# Patient Record
Sex: Female | Born: 1950 | Race: White | Hispanic: No | Marital: Married
Health system: Southern US, Community
[De-identification: ages and names within clinical notes are randomized; demographics above are authoritative.]

## PROBLEM LIST (undated history)

## (undated) DIAGNOSIS — I2510 Atherosclerotic heart disease of native coronary artery without angina pectoris: Secondary | ICD-10-CM

## (undated) DIAGNOSIS — I10 Essential (primary) hypertension: Secondary | ICD-10-CM

## (undated) DIAGNOSIS — I509 Heart failure, unspecified: Secondary | ICD-10-CM

## (undated) DIAGNOSIS — R7303 Prediabetes: Secondary | ICD-10-CM

## (undated) DIAGNOSIS — I712 Thoracic aortic aneurysm, without rupture, unspecified: Secondary | ICD-10-CM

## (undated) DIAGNOSIS — F419 Anxiety disorder, unspecified: Secondary | ICD-10-CM

## (undated) DIAGNOSIS — I779 Disorder of arteries and arterioles, unspecified: Secondary | ICD-10-CM

## (undated) DIAGNOSIS — Z972 Presence of dental prosthetic device (complete) (partial): Secondary | ICD-10-CM

## (undated) DIAGNOSIS — D649 Anemia, unspecified: Secondary | ICD-10-CM

## (undated) DIAGNOSIS — Z9289 Personal history of other medical treatment: Secondary | ICD-10-CM

## (undated) DIAGNOSIS — I493 Ventricular premature depolarization: Secondary | ICD-10-CM

## (undated) DIAGNOSIS — Z973 Presence of spectacles and contact lenses: Secondary | ICD-10-CM

## (undated) DIAGNOSIS — K279 Peptic ulcer, site unspecified, unspecified as acute or chronic, without hemorrhage or perforation: Secondary | ICD-10-CM

## (undated) DIAGNOSIS — S060XAA Concussion with loss of consciousness status unknown, initial encounter: Secondary | ICD-10-CM

## (undated) DIAGNOSIS — M1990 Unspecified osteoarthritis, unspecified site: Secondary | ICD-10-CM

## (undated) DIAGNOSIS — I714 Abdominal aortic aneurysm, without rupture, unspecified: Secondary | ICD-10-CM

## (undated) DIAGNOSIS — K219 Gastro-esophageal reflux disease without esophagitis: Secondary | ICD-10-CM

## (undated) DIAGNOSIS — I739 Peripheral vascular disease, unspecified: Secondary | ICD-10-CM

## (undated) DIAGNOSIS — I700 Atherosclerosis of aorta: Secondary | ICD-10-CM

## (undated) DIAGNOSIS — Z720 Tobacco use: Secondary | ICD-10-CM

## (undated) DIAGNOSIS — J449 Chronic obstructive pulmonary disease, unspecified: Secondary | ICD-10-CM

## (undated) DIAGNOSIS — C801 Malignant (primary) neoplasm, unspecified: Secondary | ICD-10-CM

## (undated) DIAGNOSIS — E785 Hyperlipidemia, unspecified: Secondary | ICD-10-CM

## (undated) DIAGNOSIS — R079 Chest pain, unspecified: Secondary | ICD-10-CM

## (undated) DIAGNOSIS — Q249 Congenital malformation of heart, unspecified: Secondary | ICD-10-CM

## (undated) DIAGNOSIS — G629 Polyneuropathy, unspecified: Secondary | ICD-10-CM

## (undated) DIAGNOSIS — M810 Age-related osteoporosis without current pathological fracture: Secondary | ICD-10-CM

## (undated) DIAGNOSIS — J189 Pneumonia, unspecified organism: Secondary | ICD-10-CM

## (undated) DIAGNOSIS — J45909 Unspecified asthma, uncomplicated: Secondary | ICD-10-CM

## (undated) DIAGNOSIS — R002 Palpitations: Secondary | ICD-10-CM

## (undated) HISTORY — DX: Palpitations: R00.2

## (undated) HISTORY — DX: Ventricular premature depolarization: I49.3

## (undated) HISTORY — PX: MULTIPLE TOOTH EXTRACTIONS: SHX2053

## (undated) HISTORY — PX: ROTATOR CUFF REPAIR: SHX139

## (undated) HISTORY — PX: WRIST ARTHROCENTESIS: SUR48

## (undated) HISTORY — DX: Hyperlipidemia, unspecified: E78.5

## (undated) HISTORY — PX: BREAST SURGERY: SHX581

## (undated) HISTORY — DX: Disorder of arteries and arterioles, unspecified: I77.9

## (undated) HISTORY — PX: VEIN BYPASS SURGERY: SHX833

## (undated) HISTORY — DX: Peripheral vascular disease, unspecified: I73.9

## (undated) HISTORY — DX: Unspecified asthma, uncomplicated: J45.909

## (undated) HISTORY — DX: Congenital malformation of heart, unspecified: Q24.9

## (undated) HISTORY — DX: Personal history of other medical treatment: Z92.89

## (undated) HISTORY — DX: Thoracic aortic aneurysm, without rupture, unspecified: I71.20

## (undated) HISTORY — DX: Polyneuropathy, unspecified: G62.9

## (undated) HISTORY — DX: Essential (primary) hypertension: I10

## (undated) HISTORY — PX: CARDIAC CATHETERIZATION: SHX172

## (undated) HISTORY — DX: Chronic obstructive pulmonary disease, unspecified: J44.9

## (undated) HISTORY — DX: Gastro-esophageal reflux disease without esophagitis: K21.9

## (undated) HISTORY — DX: Age-related osteoporosis without current pathological fracture: M81.0

## (undated) HISTORY — PX: TUBAL LIGATION: SHX77

## (undated) HISTORY — PX: ABDOMINAL HYSTERECTOMY: SHX81

## (undated) HISTORY — DX: Unspecified osteoarthritis, unspecified site: M19.90

## (undated) HISTORY — PX: HIP SURGERY: SHX245

---

## 1998-03-30 ENCOUNTER — Other Ambulatory Visit: Admission: RE | Admit: 1998-03-30 | Discharge: 1998-03-30 | Payer: Self-pay | Admitting: Family Medicine

## 2001-01-13 ENCOUNTER — Encounter: Admission: RE | Admit: 2001-01-13 | Discharge: 2001-01-13 | Payer: Self-pay

## 2001-01-23 ENCOUNTER — Encounter: Payer: Self-pay | Admitting: *Deleted

## 2001-01-23 ENCOUNTER — Inpatient Hospital Stay (HOSPITAL_COMMUNITY): Admission: EM | Admit: 2001-01-23 | Discharge: 2001-01-24 | Payer: Self-pay | Admitting: Emergency Medicine

## 2001-01-29 ENCOUNTER — Encounter: Admission: RE | Admit: 2001-01-29 | Discharge: 2001-01-29 | Payer: Self-pay

## 2003-09-29 ENCOUNTER — Ambulatory Visit (HOSPITAL_COMMUNITY): Admission: RE | Admit: 2003-09-29 | Discharge: 2003-09-29 | Payer: Self-pay | Admitting: Vascular Surgery

## 2003-10-11 ENCOUNTER — Inpatient Hospital Stay (HOSPITAL_COMMUNITY): Admission: RE | Admit: 2003-10-11 | Discharge: 2003-10-14 | Payer: Self-pay | Admitting: Vascular Surgery

## 2004-05-30 ENCOUNTER — Ambulatory Visit (HOSPITAL_COMMUNITY): Admission: RE | Admit: 2004-05-30 | Discharge: 2004-05-30 | Payer: Self-pay | Admitting: Gastroenterology

## 2013-06-25 ENCOUNTER — Encounter (INDEPENDENT_AMBULATORY_CARE_PROVIDER_SITE_OTHER): Payer: Self-pay | Admitting: General Surgery

## 2013-06-25 ENCOUNTER — Encounter (INDEPENDENT_AMBULATORY_CARE_PROVIDER_SITE_OTHER): Payer: Self-pay

## 2013-06-25 ENCOUNTER — Ambulatory Visit (INDEPENDENT_AMBULATORY_CARE_PROVIDER_SITE_OTHER): Payer: Medicare Other | Admitting: General Surgery

## 2013-06-25 VITALS — BP 140/80 | HR 64 | Temp 98.4°F | Resp 14 | Ht 68.0 in | Wt 149.6 lb

## 2013-06-25 DIAGNOSIS — M6208 Separation of muscle (nontraumatic), other site: Secondary | ICD-10-CM

## 2013-06-25 DIAGNOSIS — M6200 Separation of muscle (nontraumatic), unspecified site: Secondary | ICD-10-CM

## 2013-06-25 NOTE — Patient Instructions (Signed)
The bulge in your upper abdomen that you see when you sit up is not a hernia. 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  The CT scan also does not show any hernia or defect in the muscles. You do not need an operation  This is not the cause of your pain.  This is not dangerous and it is unlikely to cause any problem with the intestines.  Keep her appointment for cardiac evaluation because of your coronary artery calcifications.  Ask Dr. Nathanial Rancher to refer you to a gastroenterologist because of the thickening of your gastric wall seen on CT scan  Stop smoking.

## 2013-06-25 NOTE — Progress Notes (Signed)
Patient ID: Marie Johnson, female   DOB: [birth year], 62 y.o.   MRN: [id number]  This 62 year old Caucasian female is referred by Dr. Burnell Blanks, [facility], [facility], for evaluation of a bulge in the upper abdomen.  The patient has noticed a bulge in her upper abdomen when she sits up. She has some abdominal discomfort and bloating and nausea. Liver function tests were normal. Gallbladder ultrasound was basically normal except for benign hepatic cysts. A CT scan showed a tiny lung nodule. Significant coronary calcifications were noted and she has been referred to [facility] cardiology for evaluation. She did have a cardiac cath by [employer] many years ago. Renal artery vascular calcifications were noted. 3.4 cm abdominal aortic aneurysm was noted but she was aware of this. There was no abdominal wall hernia seen on CT scan. Gastric wall thickening was noted.  Comorbidities include tobacco abuse, one and one half packs per day. Status post left femoropopliteal bypass by Dr. Arbie Cookey 15 years ago. Asthma. Anxiety. Abdominal aortic aneurysm. Coronary artery disease, hypertension. She is here with her husband. Chief Complaint  Patient presents with  . New Evaluation    eval ventral hernia    HPI Marie Johnson is a 62 y.o. female.  She is recurred by Dr. Burnell Blanks, [facility] she is, [facility] for evaluation of a bulge in the upper abdomen.  HPI  Past Medical History  Diagnosis Date  . Arthritis   . Asthma   . GERD (gastroesophageal reflux disease)   . COPD (chronic obstructive pulmonary disease)   . Hyperlipidemia   . Hypertension   . Osteoporosis   . PAD (peripheral artery disease)   . CHD (congenital heart disease)     Past Surgical History  Procedure Laterality Date  . Vein bypass surgery      left leg  . Rotator cuff repair      right side, had torn ligaments as well  . Wrist arthrocentesis      left - broke in 3 places  so has bars and screws placed  . Hip surgery      left - bars and screws placed  . Abdominal hysterectomy    . Tubal ligation      Family History  Problem Relation Age of Onset  . Cancer Mother     cervica  . Heart disease Mother   . Cancer Father     liver  . Heart disease Father   . Cancer Maternal Aunt     lung  . Cancer Maternal Uncle     prostate and anal  . Cancer Maternal Grandmother     breast  . Cancer Maternal Aunt     lung  . Cancer Maternal Aunt     lung  . Cancer Maternal Aunt     stomach  . Cancer Maternal Aunt     colon  . Cancer Maternal Uncle     lung    Social History History  Substance Use Topics  . Smoking status: Current Every Day Smoker -- 0.50 packs/day  . Smokeless tobacco: Never Used  . Alcohol Use: No    Allergies  Allergen Reactions  . Beesix [Pyridoxine]     Current Outpatient Prescriptions  Medication Sig Dispense Refill  . atenolol (TENORMIN) 50 MG tablet 50 mg 2 (two) times daily.       . furosemide (LASIX) 40 MG tablet       . LORazepam (ATIVAN) 1  MG tablet       . losartan (COZAAR) 25 MG tablet       . NITROSTAT 0.4 MG SL tablet       . Oxycodone HCl 10 MG TABS       . pantoprazole (PROTONIX) 40 MG tablet       . pravastatin (PRAVACHOL) 40 MG tablet       . PROAIR HFA 108 (90 BASE) MCG/ACT inhaler       . traMADol (ULTRAM) 50 MG tablet        No current facility-administered medications for this visit.    Review of Systems Review of Systems  Constitutional: Negative for fever, chills and unexpected weight change.  HENT: Negative for congestion, hearing loss, sore throat, trouble swallowing and voice change.   Eyes: Negative for visual disturbance.  Respiratory: Negative for cough and wheezing.   Cardiovascular: Negative for chest pain, palpitations and leg swelling.  Gastrointestinal: Positive for abdominal pain and abdominal distention. Negative for nausea, vomiting, diarrhea, constipation, blood in stool and anal  bleeding.  Genitourinary: Negative for hematuria, vaginal bleeding and difficulty urinating.  Musculoskeletal: Negative for arthralgias.  Skin: Negative for rash and wound.  Neurological: Negative for seizures, syncope and headaches.  Hematological: Negative for adenopathy. Does not bruise/bleed easily.  Psychiatric/Behavioral: Negative for confusion.    Blood pressure 140/80, pulse 64, temperature 98.4 F (36.9 C), resp. rate 14, height 5\' 8"  (1.727 m), weight 149 lb 9.6 oz (67.858 kg).  Physical Exam Physical Exam  Constitutional: She is oriented to person, place, and time. She appears well-developed and well-nourished. No distress.  Strong odor of tobacco.  HENT:  Head: Normocephalic and atraumatic.  Nose: Nose normal.  Mouth/Throat: No oropharyngeal exudate.  Eyes: Conjunctivae and EOM are normal. Pupils are equal, round, and reactive to light. Left eye exhibits no discharge. No scleral icterus.  Neck: Neck supple. No JVD present. No tracheal deviation present. No thyromegaly present.  Cardiovascular: Normal rate, regular rhythm, normal heart sounds and intact distal pulses.   No murmur heard. Pulmonary/Chest: Effort normal and breath sounds normal. No respiratory distress. She has no wheezes. She has no rales. She exhibits no tenderness.  Abdominal: Soft. Bowel sounds are normal. She exhibits no distension and no mass. There is no tenderness. There is no rebound and no guarding.  She has a elliptical bulge in her upper upper abdomen notable when she sits up from a supine position. There is no muscle defect. There is no defect or bulge when she is standing. She has a diastases recti. No organomegaly or significant tenderness.  Musculoskeletal: She exhibits no edema and no tenderness.  Lymphadenopathy:    She has no cervical adenopathy.  Neurological: She is alert and oriented to person, place, and time. She exhibits normal muscle tone. Coordination normal.  Skin: Skin is warm. No  rash noted. She is not diaphoretic. No erythema. No pallor.  Psychiatric: She has a normal mood and affect. Her behavior is normal. Judgment and thought content normal.    Data Reviewed CT scan report. Office notes from [employer]. Other imaging studies and lab work.  Assessment/Plan:    Diastases recti. There is no indication for surgical intervention. This is not dangerous and I do not think this is the cause of her abdominal pain.  Gastric wall thickening. Advised that she seek referral to a gastroenterologist for possible upper endoscopy  Coronary disease with calcifications. Agree with plans cardiology evaluation  She was strongly urged to  stop smoking  Return to see me as needed.             Angelia Mould. Derrell Lolling, M.D., [facility] General and Minimally invasive Surgery Breast and Colorectal Surgery Office:   [phone] Pager:   [phone]  06/25/2013, 11:58 AM

## 2013-06-26 ENCOUNTER — Telehealth: Payer: Self-pay

## 2013-06-26 NOTE — Telephone Encounter (Signed)
Recd records from [facility], [place] 10 pgs to Historical Provider

## 2013-07-13 ENCOUNTER — Ambulatory Visit (INDEPENDENT_AMBULATORY_CARE_PROVIDER_SITE_OTHER): Payer: Medicare Other | Admitting: Cardiology

## 2013-07-13 ENCOUNTER — Encounter: Payer: Self-pay | Admitting: Cardiology

## 2013-07-13 VITALS — BP 142/74 | HR 56 | Ht 69.0 in | Wt 146.0 lb

## 2013-07-13 DIAGNOSIS — R079 Chest pain, unspecified: Secondary | ICD-10-CM

## 2013-07-13 NOTE — Patient Instructions (Addendum)
**Note De-identified Dorthula Bier Obfuscation** Your physician has requested that you have a lexiscan myoview. For further information please visit [email or web address]. Please follow instruction sheet, as given.  Your physician recommends that you schedule a follow-up appointment in: 1 month.   

## 2013-07-13 NOTE — Progress Notes (Signed)
Patient ID: Marie Johnson, female   DOB: 13-Aug-1951, 62 y.o.   MRN: [id number]    Patient Name: Marie Johnson Date of Encounter: 07/13/2013  Primary Care Provider:  Ailene Ravel, MD Primary Cardiologist:  Tobias Alexander, H  Patient Profile  Chest pain  Problem List   Past Medical History  Diagnosis Date  . Arthritis   . Asthma   . GERD (gastroesophageal reflux disease)   . COPD (chronic obstructive pulmonary disease)   . Hyperlipidemia   . Hypertension   . Osteoporosis   . PAD (peripheral artery disease)   . CHD (congenital heart disease)    Past Surgical History  Procedure Laterality Date  . Vein bypass surgery      left leg  . Rotator cuff repair      right side, had torn ligaments as well  . Wrist arthrocentesis      left - broke in 3 places so has bars and screws placed  . Hip surgery      left - bars and screws placed  . Abdominal hysterectomy    . Tubal ligation     Allergies  Allergies  Allergen Reactions  . Beesix [Pyridoxine]    HPI  62 year old female, older appearing, lifelong smoker with prior medical history of hypertension, PAD who was recently evaluated for an abdominal hernia. There was an incidental finding of significant coronary calcifications on the CT and she was referred to [place] for further evaluation.  The patient states that she has exertional dyspnea and chest tightness with minimal exertion. As an example she uses this walking one flight of stairs when she has to stop and it resolves within a few minutes. She denies any resting chest pain. Denies any palpitations or syncope. The pain feels pressure-like retrosternal with no radiation. She did have a cardiac cath by [employer] many years ago with findings of non-obstructive coronary artery disease at that time.  There was 3.4 cm abdominal aortic aneurysm on her CT that is known for many years. The patient is a lifelong smoker, currently smoking one and one half packs per day. She has a  h/o left femoropopliteal bypass by Dr. Arbie Cookey 15 years ago. She complains of bilateral claudications left worse than right that start after walking minimal distances. She underwent arterial Doppler ultrasound bilaterally and lower extremity angiogram at [facility]. She was talled that she had severe diffuse peripheral arterial disease, however her lesions are distal and her vessels are small for interventions.  Home Medications  Prior to Admission medications   Medication Sig Start Date End Date Taking? Authorizing Provider  atenolol (TENORMIN) 50 MG tablet 50 mg 2 (two) times daily.  06/09/13  Yes Historical Provider, MD  furosemide (LASIX) 40 MG tablet  06/09/13  Yes Historical Provider, MD  LORazepam (ATIVAN) 1 MG tablet  04/22/13  Yes Historical Provider, MD  losartan (COZAAR) 25 MG tablet  05/26/13  Yes Historical Provider, MD  NITROSTAT 0.4 MG SL tablet  06/24/13  Yes Historical Provider, MD  Oxycodone HCl 10 MG TABS  06/08/13  Yes Historical Provider, MD  pantoprazole (PROTONIX) 40 MG tablet  06/09/13  Yes Historical Provider, MD  pravastatin (PRAVACHOL) 40 MG tablet  05/27/13  Yes Historical Provider, MD  PROAIR HFA 108 (90 BASE) MCG/ACT inhaler  06/08/13  Yes Historical Provider, MD  traMADol Janean Sark) 50 MG tablet  06/14/13  Yes Historical Provider, MD   Family History  Family History  Problem Relation Age of Onset  .  Cancer Mother     cervica  . Heart disease Mother   . Cancer Father     liver  . Heart disease Father   . Cancer Maternal Aunt     lung  . Cancer Maternal Uncle     prostate and anal  . Cancer Maternal Grandmother     breast  . Cancer Maternal Aunt     lung  . Cancer Maternal Aunt     lung  . Cancer Maternal Aunt     stomach  . Cancer Maternal Aunt     colon  . Cancer Maternal Uncle     lung    Social History  History   Social History  . Marital Status: Married    Spouse Name: N/A    Number of Children: N/A  . Years of Education: N/A    Occupational History  . Not on file.   Social History Main Topics  . Smoking status: Current Every Day Smoker -- 0.50 packs/day  . Smokeless tobacco: Never Used  . Alcohol Use: No  . Drug Use: No  . Sexual Activity: Not on file   Other Topics Concern  . Not on file   Social History Narrative  . No narrative on file     Review of Systems General:  No chills, fever, night sweats or weight changes.  Cardiovascular:  No chest pain, dyspnea on exertion, edema, orthopnea, palpitations, paroxysmal nocturnal dyspnea. Dermatological: No rash, lesions/masses Respiratory: No cough, dyspnea Urologic: No hematuria, dysuria Abdominal:   No nausea, vomiting, diarrhea, bright red blood per rectum, melena, or hematemesis Neurologic:  No visual changes, wkns, changes in mental status. All other systems reviewed and are otherwise negative except as noted above.  Physical Exam  Blood pressure 142/74, pulse 56, height 5\' 9"  (1.753 m), weight 146 lb (66.225 kg).  General: Older appearang, smelling like cigaretes, NAD Psych: Normal affect. Neuro: Alert and oriented X 3. Moves all extremities spontaneously. HEENT: Normal  Neck: Supple without bruits or JVD. Lungs:  Resp regular and unlabored, CTA. Heart: RRR no s3, s4, or murmurs. Abdomen: Soft, non-tender, non-distended, BS + x 4.  Extremities: No clubbing, cyanosis or edema. Radials 2+ LE cold, no pulses  DP/PT/ bilaterally.  Accessory Clinical Findings  ECG - sinus bradycardia, 56 beats per minute, otherwise normal EKG.  Lipid Panel  No results found for this basename: chol, trig, hdl, cholhdl, vldl, ldlcalc    Assessment & Plan  62 year old female lifelong smoker, older appearing, coming for concern of chest pain and coronary artery calcifications on CT.  1. Chest pain - appears typical, patient has multiple risk factors including smoking, hypertension, hyperlipidemia, we will order a Lexiscan a nuclear stress test as the patient  wouldn't be able to walk on treadmill because she is limited by her claudications. Continue aspirin, statin, atenolol, losartan, s.l. nitroglycerin when necessary.  2. Hyperlipidemia - TAG 98, HDL 78, LDL 97, WNL, continue pravachol 40 mg po daily  3. Hypertension - borderline, we will rechcek at the next visit, if still elevated we will adjust BP meds  4. PAD - severe B/L, however not amenable to intervention, the patient was consulted on smoking cessation, she is not interested  5. Smoking cessation - the patient is not interested in any intervention or support group and doesn't seem to be interested in cessation in general  Follow up in 1 month   Tobias Alexander, Rexene Edison, MD 07/13/2013, 4:05 PM

## 2013-07-16 ENCOUNTER — Ambulatory Visit (HOSPITAL_BASED_OUTPATIENT_CLINIC_OR_DEPARTMENT_OTHER): Payer: Medicare Other | Admitting: Radiology

## 2013-07-16 VITALS — BP 142/77 | HR 72 | Ht 69.0 in | Wt 145.0 lb

## 2013-07-16 DIAGNOSIS — R079 Chest pain, unspecified: Secondary | ICD-10-CM

## 2013-07-16 DIAGNOSIS — R0602 Shortness of breath: Secondary | ICD-10-CM

## 2013-07-16 MED ORDER — TECHNETIUM TC 99M SESTAMIBI GENERIC - CARDIOLITE
11.0000 | Freq: Once | INTRAVENOUS | Status: AC | PRN
  Administered 2013-07-16: 11 via INTRAVENOUS

## 2013-07-16 MED ORDER — REGADENOSON 0.4 MG/5ML IV SOLN
0.4000 mg | Freq: Once | INTRAVENOUS | Status: AC
  Administered 2013-07-16: 0.4 mg via INTRAVENOUS

## 2013-07-16 MED ORDER — TECHNETIUM TC 99M SESTAMIBI GENERIC - CARDIOLITE
33.0000 | Freq: Once | INTRAVENOUS | Status: AC | PRN
  Administered 2013-07-16: 33 via INTRAVENOUS

## 2013-07-16 NOTE — Progress Notes (Signed)
[facility] SITE 3 NUCLEAR MED [address] [place], [place] [address] [phone]    Cardiology Nuclear Med Study  Marie Johnson is a 62 y.o. female     MRN : [id number]     DOB: [birth year]  Procedure Date: 07/16/2013  Nuclear Med Background Indication for Stress Test:  Evaluation for Ischemia History:  CAD, 3.4 cm AAA, MR - coronary calcifications Cardiac Risk Factors: Family History - CAD, History of Smoking, Hypertension, Lipids and PVD  Symptoms:  Chest Pain with Exertion (last date of chest discomfort was yesterday), Dizziness, DOE and Syncope   Nuclear Pre-Procedure Caffeine/Decaff Intake:  None NPO After: 5 pm   Lungs:  Patient has some wheezing which is common for her. O2 Sat: 98% on room air. IV 0.9% NS with Angio Cath:  22g  IV Site: R Antecubital  IV Started by:  Bonnita Levan, RN  Chest Size (in):  36 Cup Size: B  Height: 5\' 9"  (1.753 m)  Weight:  145 lb (65.772 kg)  BMI:  Body mass index is 21.4 kg/(m^2). Tech Comments:  N/A    Nuclear Med Study 1 or 2 day study: 1 day  Stress Test Type:  Lexiscan  Reading MD: Marca Ancona, MD  Order Authorizing Provider:  Tobias Alexander, MD  Resting Radionuclide: Technetium 64m Sestamibi  Resting Radionuclide Dose: 11.0 mCi   Stress Radionuclide:  Technetium 75m Sestamibi  Stress Radionuclide Dose: 33.0 mCi           Stress Protocol Rest HR: 72 Stress HR: 100  Rest BP: 142/77 Stress BP: 151/71  Exercise Time (min): n/a METS: n/a   Predicted Max HR: 156 bpm % Max HR: 64.1 bpm Rate Pressure Product: 21308   Dose of Adenosine (mg):  n/a Dose of Lexiscan: 0.4 mg  Dose of Atropine (mg): n/a Dose of Dobutamine: n/a mcg/kg/min (at max HR)  Stress Test Technologist: Nelson Chimes, BS-ES  Nuclear Technologist:  Doyne Keel, CNMT     Rest Procedure:  Myocardial perfusion imaging was performed at rest 45 minutes following the intravenous administration of Technetium 41m Sestamibi. Rest ECG: NSR - Normal  EKG  Stress Procedure:  The patient received IV Lexiscan 0.4 mg over 15-seconds.  Technetium 109m Sestamibi injected at 30-seconds.  Quantitative spect images were obtained after a 45 minute delay. During the infusion of Lexiscan, patient became SOB, had nausea and a headache.  All symptoms resolved in recovery with the exception of her headache.   Stress ECG: No significant change from baseline ECG  QPS Raw Data Images:  Normal; no motion artifact; normal heart/lung ratio. Stress Images:  Normal homogeneous uptake in all areas of the myocardium. Rest Images:  Normal homogeneous uptake in all areas of the myocardium. Subtraction (SDS):  There is no evidence of scar or ischemia. Transient Ischemic Dilatation (Normal <1.22):  0.95 Lung/Heart Ratio (Normal <0.45):  0.30  Quantitative Gated Spect Images QGS EDV:  57 ml QGS ESV:  15 ml  Impression Exercise Capacity:  Lexiscan with no exercise. BP Response:  Normal blood pressure response. Clinical Symptoms:  Dyspnea, nausea. ECG Impression:  No significant ST segment change suggestive of ischemia. Comparison with Prior Nuclear Study: No images to compare  Overall Impression:  Normal stress nuclear study.  LV Ejection Fraction: 73%.  LV Wall Motion:  NL LV Function; NL Wall Motion  Marca Ancona 07/16/2013

## 2013-07-17 ENCOUNTER — Emergency Department (HOSPITAL_COMMUNITY): Payer: Medicare Other

## 2013-07-17 ENCOUNTER — Encounter (HOSPITAL_COMMUNITY): Payer: Self-pay | Admitting: Emergency Medicine

## 2013-07-17 ENCOUNTER — Inpatient Hospital Stay (HOSPITAL_COMMUNITY)
Admission: EM | Admit: 2013-07-17 | Discharge: 2013-07-19 | DRG: 287 | Disposition: A | Discharge status: Home / Self Care | Payer: Medicare Other | Attending: Cardiology | Admitting: Cardiology

## 2013-07-17 ENCOUNTER — Encounter (HOSPITAL_COMMUNITY)
Admission: EM | Disposition: A | Discharge status: Home / Self Care | Payer: Self-pay | Source: Home / Self Care | Attending: Cardiology

## 2013-07-17 ENCOUNTER — Other Ambulatory Visit: Payer: Self-pay

## 2013-07-17 DIAGNOSIS — Z79899 Other long term (current) drug therapy: Secondary | ICD-10-CM

## 2013-07-17 DIAGNOSIS — F411 Generalized anxiety disorder: Secondary | ICD-10-CM | POA: Diagnosis present

## 2013-07-17 DIAGNOSIS — I10 Essential (primary) hypertension: Secondary | ICD-10-CM

## 2013-07-17 DIAGNOSIS — I119 Hypertensive heart disease without heart failure: Secondary | ICD-10-CM | POA: Diagnosis present

## 2013-07-17 DIAGNOSIS — K219 Gastro-esophageal reflux disease without esophagitis: Secondary | ICD-10-CM

## 2013-07-17 DIAGNOSIS — Z720 Tobacco use: Secondary | ICD-10-CM

## 2013-07-17 DIAGNOSIS — R079 Chest pain, unspecified: Principal | ICD-10-CM | POA: Insufficient documentation

## 2013-07-17 DIAGNOSIS — J4489 Other specified chronic obstructive pulmonary disease: Secondary | ICD-10-CM | POA: Diagnosis present

## 2013-07-17 DIAGNOSIS — I201 Angina pectoris with documented spasm: Secondary | ICD-10-CM | POA: Diagnosis present

## 2013-07-17 DIAGNOSIS — I700 Atherosclerosis of aorta: Secondary | ICD-10-CM

## 2013-07-17 DIAGNOSIS — E876 Hypokalemia: Secondary | ICD-10-CM

## 2013-07-17 DIAGNOSIS — Z8249 Family history of ischemic heart disease and other diseases of the circulatory system: Secondary | ICD-10-CM

## 2013-07-17 DIAGNOSIS — Z7982 Long term (current) use of aspirin: Secondary | ICD-10-CM

## 2013-07-17 DIAGNOSIS — I714 Abdominal aortic aneurysm, without rupture, unspecified: Secondary | ICD-10-CM

## 2013-07-17 DIAGNOSIS — I2510 Atherosclerotic heart disease of native coronary artery without angina pectoris: Secondary | ICD-10-CM

## 2013-07-17 DIAGNOSIS — E871 Hypo-osmolality and hyponatremia: Secondary | ICD-10-CM

## 2013-07-17 DIAGNOSIS — I739 Peripheral vascular disease, unspecified: Secondary | ICD-10-CM

## 2013-07-17 DIAGNOSIS — J449 Chronic obstructive pulmonary disease, unspecified: Secondary | ICD-10-CM

## 2013-07-17 DIAGNOSIS — I160 Hypertensive urgency: Secondary | ICD-10-CM

## 2013-07-17 DIAGNOSIS — F17200 Nicotine dependence, unspecified, uncomplicated: Secondary | ICD-10-CM | POA: Diagnosis present

## 2013-07-17 DIAGNOSIS — E785 Hyperlipidemia, unspecified: Secondary | ICD-10-CM

## 2013-07-17 DIAGNOSIS — I200 Unstable angina: Secondary | ICD-10-CM

## 2013-07-17 DIAGNOSIS — R7989 Other specified abnormal findings of blood chemistry: Secondary | ICD-10-CM

## 2013-07-17 HISTORY — DX: Atherosclerotic heart disease of native coronary artery without angina pectoris: I25.10

## 2013-07-17 HISTORY — DX: Tobacco use: Z72.0

## 2013-07-17 HISTORY — DX: Peptic ulcer, site unspecified, unspecified as acute or chronic, without hemorrhage or perforation: K27.9

## 2013-07-17 HISTORY — DX: Abdominal aortic aneurysm, without rupture, unspecified: I71.40

## 2013-07-17 HISTORY — DX: Anxiety disorder, unspecified: F41.9

## 2013-07-17 HISTORY — PX: LEFT HEART CATHETERIZATION WITH CORONARY ANGIOGRAM: SHX5451

## 2013-07-17 HISTORY — DX: Peripheral vascular disease, unspecified: I73.9

## 2013-07-17 HISTORY — DX: Abdominal aortic aneurysm, without rupture: I71.4

## 2013-07-17 HISTORY — DX: Chest pain, unspecified: R07.9

## 2013-07-17 HISTORY — DX: Atherosclerosis of aorta: I70.0

## 2013-07-17 LAB — COMPREHENSIVE METABOLIC PANEL
AST: 16 U/L (ref 0–37)
Albumin: 4 g/dL (ref 3.5–5.2)
BUN: 13 mg/dL (ref 6–23)
Calcium: 9.7 mg/dL (ref 8.4–10.5)
Creatinine, Ser: 0.68 mg/dL (ref 0.50–1.10)
GFR calc non Af Amer: >90 mL/min (ref >90)
Total Bilirubin: 0.5 mg/dL (ref 0.3–1.2)
Total Protein: 7.8 g/dL (ref 6.0–8.3)

## 2013-07-17 LAB — MRSA PCR SCREENING: MRSA by PCR: NEGATIVE

## 2013-07-17 LAB — CBC
HCT: 37.7 % (ref 36.0–46.0)
Hemoglobin: 13.3 g/dL (ref 12.0–15.0)
MCH: 35.3 pg — ABNORMAL HIGH (ref 26.0–34.0)
MCHC: 35.3 g/dL (ref 30.0–36.0)
RBC: 3.77 MIL/uL — ABNORMAL LOW (ref 3.87–5.11)

## 2013-07-17 LAB — TROPONIN I
Troponin I: <0.3 ng/mL (ref <0.30)
Troponin I: <0.3 ng/mL (ref <0.30)
Troponin I: <0.3 ng/mL (ref <0.30)

## 2013-07-17 LAB — CBC WITH DIFFERENTIAL/PLATELET
Basophils Relative: 0 % (ref 0–1)
Eosinophils Absolute: 0.1 10*3/uL (ref 0.0–0.7)
Eosinophils Relative: 2 % (ref 0–5)
HCT: 40.7 % (ref 36.0–46.0)
Hemoglobin: 14.8 g/dL (ref 12.0–15.0)
Lymphs Abs: 1.9 10*3/uL (ref 0.7–4.0)
MCH: 36.1 pg — ABNORMAL HIGH (ref 26.0–34.0)
MCHC: 36.4 g/dL — ABNORMAL HIGH (ref 30.0–36.0)
MCV: 99.3 fL (ref 78.0–100.0)
Monocytes Absolute: 0.5 10*3/uL (ref 0.1–1.0)
Monocytes Relative: 9 % (ref 3–12)
RBC: 4.1 MIL/uL (ref 3.87–5.11)
RDW: 13.7 % (ref 11.5–15.5)

## 2013-07-17 LAB — PRO B NATRIURETIC PEPTIDE: Pro B Natriuretic peptide (BNP): 763.6 pg/mL — ABNORMAL HIGH (ref 0–125)

## 2013-07-17 LAB — PROTIME-INR
INR: 1.02 (ref 0.00–1.49)
Prothrombin Time: 13.2 seconds (ref 11.6–15.2)

## 2013-07-17 LAB — LIPASE, BLOOD: Lipase: 49 U/L (ref 11–59)

## 2013-07-17 LAB — D-DIMER, QUANTITATIVE: D-Dimer, Quant: 0.75 ug/mL-FEU — ABNORMAL HIGH (ref 0.00–0.48)

## 2013-07-17 LAB — CREATININE, SERUM: GFR calc non Af Amer: 87 mL/min — ABNORMAL LOW (ref >90)

## 2013-07-17 SURGERY — LEFT HEART CATHETERIZATION WITH CORONARY ANGIOGRAM
Anesthesia: LOCAL

## 2013-07-17 MED ORDER — ACETAMINOPHEN 325 MG PO TABS: 650.0000 mg | ORAL_TABLET | ORAL | Status: DC | PRN

## 2013-07-17 MED ORDER — HEPARIN SODIUM (PORCINE) 5000 UNIT/ML IJ SOLN
5000.0000 [IU] | Freq: Three times a day (TID) | INTRAMUSCULAR | Status: DC
  Administered 2013-07-17 – 2013-07-19 (×5): 5000 [IU] via SUBCUTANEOUS
  Filled 2013-07-17 (×9): qty 1

## 2013-07-17 MED ORDER — SODIUM CHLORIDE 0.9 % IV SOLN
INTRAVENOUS | Status: DC
  Administered 2013-07-17: 12:00:00 via INTRAVENOUS

## 2013-07-17 MED ORDER — HYDRALAZINE HCL 20 MG/ML IJ SOLN
10.0000 mg | INTRAMUSCULAR | Status: DC | PRN
  Filled 2013-07-17: qty 1

## 2013-07-17 MED ORDER — ONDANSETRON HCL 4 MG/2ML IJ SOLN: 4.0000 mg | Freq: Four times a day (QID) | INTRAMUSCULAR | Status: DC | PRN

## 2013-07-17 MED ORDER — HEPARIN BOLUS VIA INFUSION
4000.0000 [IU] | Freq: Once | INTRAVENOUS | Status: AC
  Administered 2013-07-17: 4000 [IU] via INTRAVENOUS
  Filled 2013-07-17: qty 4000

## 2013-07-17 MED ORDER — HYDRALAZINE HCL 20 MG/ML IJ SOLN
10.0000 mg | INTRAMUSCULAR | Status: DC | PRN
  Administered 2013-07-17 – 2013-07-18 (×2): 10 mg via INTRAVENOUS
  Filled 2013-07-17: qty 1

## 2013-07-17 MED ORDER — SODIUM CHLORIDE 0.9 % IJ SOLN: 3.0000 mL | INTRAMUSCULAR | Status: DC | PRN

## 2013-07-17 MED ORDER — SODIUM CHLORIDE 0.9 % IV SOLN: INTRAVENOUS | Status: DC

## 2013-07-17 MED ORDER — HEPARIN (PORCINE) IN NACL 100-0.45 UNIT/ML-% IJ SOLN
800.0000 [IU]/h | INTRAMUSCULAR | Status: DC
  Administered 2013-07-17: 800 [IU]/h via INTRAVENOUS
  Filled 2013-07-17: qty 250

## 2013-07-17 MED ORDER — ALBUTEROL SULFATE HFA 108 (90 BASE) MCG/ACT IN AERS
2.0000 | INHALATION_SPRAY | Freq: Four times a day (QID) | RESPIRATORY_TRACT | Status: DC | PRN
  Filled 2013-07-17: qty 6.7

## 2013-07-17 MED ORDER — MIDAZOLAM HCL 2 MG/2ML IJ SOLN
INTRAMUSCULAR | Status: AC
  Filled 2013-07-17: qty 2

## 2013-07-17 MED ORDER — PANTOPRAZOLE SODIUM 40 MG PO TBEC
40.0000 mg | DELAYED_RELEASE_TABLET | Freq: Two times a day (BID) | ORAL | Status: DC
  Administered 2013-07-17 – 2013-07-19 (×4): 40 mg via ORAL
  Filled 2013-07-17 (×4): qty 1

## 2013-07-17 MED ORDER — TRAMADOL HCL 50 MG PO TABS
50.0000 mg | ORAL_TABLET | Freq: Four times a day (QID) | ORAL | Status: DC | PRN
  Administered 2013-07-17: 50 mg via ORAL
  Filled 2013-07-17: qty 1

## 2013-07-17 MED ORDER — NITROGLYCERIN IN D5W 200-5 MCG/ML-% IV SOLN
2.0000 ug/min | INTRAVENOUS | Status: DC
  Administered 2013-07-17: 5 ug/min via INTRAVENOUS
  Filled 2013-07-17: qty 250

## 2013-07-17 MED ORDER — SODIUM CHLORIDE 0.9 % IV SOLN: 250.0000 mL | INTRAVENOUS | Status: DC | PRN

## 2013-07-17 MED ORDER — IOHEXOL 350 MG/ML SOLN
80.0000 mL | Freq: Once | INTRAVENOUS | Status: AC | PRN
  Administered 2013-07-17: 80 mL via INTRAVENOUS

## 2013-07-17 MED ORDER — ATORVASTATIN CALCIUM 80 MG PO TABS
80.0000 mg | ORAL_TABLET | ORAL | Status: AC
  Administered 2013-07-17: 80 mg via ORAL
  Filled 2013-07-17: qty 1

## 2013-07-17 MED ORDER — HEPARIN (PORCINE) IN NACL 2-0.9 UNIT/ML-% IJ SOLN
INTRAMUSCULAR | Status: AC
  Filled 2013-07-17: qty 1000

## 2013-07-17 MED ORDER — ACETAMINOPHEN 500 MG PO TABS
1000.0000 mg | ORAL_TABLET | Freq: Four times a day (QID) | ORAL | Status: DC | PRN
  Administered 2013-07-17: 1000 mg via ORAL
  Filled 2013-07-17: qty 2

## 2013-07-17 MED ORDER — FENTANYL CITRATE 0.05 MG/ML IJ SOLN
INTRAMUSCULAR | Status: AC
  Filled 2013-07-17: qty 2

## 2013-07-17 MED ORDER — POTASSIUM CHLORIDE CRYS ER 20 MEQ PO TBCR
40.0000 meq | EXTENDED_RELEASE_TABLET | Freq: Once | ORAL | Status: AC
  Administered 2013-07-17: 40 meq via ORAL
  Filled 2013-07-17: qty 2

## 2013-07-17 MED ORDER — TIOTROPIUM BROMIDE MONOHYDRATE 18 MCG IN CAPS
18.0000 ug | ORAL_CAPSULE | Freq: Every day | RESPIRATORY_TRACT | Status: DC
  Administered 2013-07-18 – 2013-07-19 (×2): 18 ug via RESPIRATORY_TRACT
  Filled 2013-07-17: qty 5

## 2013-07-17 MED ORDER — ATORVASTATIN CALCIUM 80 MG PO TABS
80.0000 mg | ORAL_TABLET | Freq: Every day | ORAL | Status: DC
  Administered 2013-07-18 – 2013-07-19 (×2): 80 mg via ORAL
  Filled 2013-07-17 (×4): qty 1

## 2013-07-17 MED ORDER — NITROGLYCERIN 2 % TD OINT
1.0000 [in_us] | TOPICAL_OINTMENT | Freq: Four times a day (QID) | TRANSDERMAL | Status: DC
  Administered 2013-07-17: 1 [in_us] via TOPICAL
  Filled 2013-07-17: qty 1

## 2013-07-17 MED ORDER — AMLODIPINE BESYLATE 5 MG PO TABS
5.0000 mg | ORAL_TABLET | Freq: Every day | ORAL | Status: DC
  Administered 2013-07-17 – 2013-07-18 (×2): 5 mg via ORAL
  Filled 2013-07-17 (×3): qty 1

## 2013-07-17 MED ORDER — ALBUTEROL SULFATE (5 MG/ML) 0.5% IN NEBU: 2.5000 mg | INHALATION_SOLUTION | RESPIRATORY_TRACT | Status: DC | PRN

## 2013-07-17 MED ORDER — LORAZEPAM 1 MG PO TABS
1.0000 mg | ORAL_TABLET | Freq: Two times a day (BID) | ORAL | Status: DC | PRN
  Administered 2013-07-17 – 2013-07-19 (×2): 1 mg via ORAL
  Filled 2013-07-17 (×2): qty 1

## 2013-07-17 MED ORDER — ASPIRIN 325 MG PO TABS
325.0000 mg | ORAL_TABLET | Freq: Every day | ORAL | Status: DC
  Administered 2013-07-18 – 2013-07-19 (×2): 325 mg via ORAL
  Filled 2013-07-17 (×2): qty 1

## 2013-07-17 MED ORDER — SODIUM CHLORIDE 0.9 % IJ SOLN: 3.0000 mL | Freq: Two times a day (BID) | INTRAMUSCULAR | Status: DC

## 2013-07-17 MED ORDER — LIDOCAINE HCL (PF) 1 % IJ SOLN
INTRAMUSCULAR | Status: AC
  Filled 2013-07-17: qty 30

## 2013-07-17 MED ORDER — HEPARIN SODIUM (PORCINE) 1000 UNIT/ML IJ SOLN
INTRAMUSCULAR | Status: AC
  Filled 2013-07-17: qty 1

## 2013-07-17 MED ORDER — DIAZEPAM 2 MG PO TABS
2.0000 mg | ORAL_TABLET | ORAL | Status: AC
  Administered 2013-07-17: 2 mg via ORAL
  Filled 2013-07-17: qty 1

## 2013-07-17 MED ORDER — NITROGLYCERIN 0.4 MG SL SUBL: 0.4000 mg | SUBLINGUAL_TABLET | SUBLINGUAL | Status: DC | PRN

## 2013-07-17 MED ORDER — SODIUM CHLORIDE 0.9 % IJ SOLN
3.0000 mL | Freq: Two times a day (BID) | INTRAMUSCULAR | Status: DC
  Administered 2013-07-17 – 2013-07-18 (×3): 3 mL via INTRAVENOUS

## 2013-07-17 MED ORDER — BISOPROLOL FUMARATE 5 MG PO TABS
5.0000 mg | ORAL_TABLET | Freq: Every day | ORAL | Status: DC
  Administered 2013-07-17 – 2013-07-18 (×2): 5 mg via ORAL
  Filled 2013-07-17 (×2): qty 1

## 2013-07-17 MED ORDER — VERAPAMIL HCL 2.5 MG/ML IV SOLN
INTRAVENOUS | Status: AC
  Filled 2013-07-17: qty 2

## 2013-07-17 MED ORDER — ZOLPIDEM TARTRATE 5 MG PO TABS: 5.0000 mg | ORAL_TABLET | Freq: Every evening | ORAL | Status: DC | PRN

## 2013-07-17 MED ORDER — ALBUTEROL SULFATE (5 MG/ML) 0.5% IN NEBU
2.5000 mg | INHALATION_SOLUTION | RESPIRATORY_TRACT | Status: DC
  Administered 2013-07-17 (×2): 2.5 mg via RESPIRATORY_TRACT
  Filled 2013-07-17 (×2): qty 0.5

## 2013-07-17 NOTE — ED Notes (Signed)
Cardiology at bedside.

## 2013-07-17 NOTE — Progress Notes (Signed)
Called by RN regarding headache, worse on the left side and patient complains of a film over her left eye as well. Blood pressure 187/77, heart rate in the high 50s and low 60s. She is otherwise asymptomatic. She is on IV nitroglycerin at 10 mcg per minute. She has bisoprolol and amlodipine ordered but has not received either medication.  Added hydralazine 10 mg IV every 4 hours when necessary for blood pressure control. Increase Tylenol to 1000 mg 4 times a day when necessary. Patient had not previously taken Tylenol because of concerns about liver functions. Advised that her LFTs were normal this admission and Tylenol could be used at this time. Advised that if her symptoms did not improve with better blood pressure control, she should get a stat noncontrast head CT to rule out bleed. We'll continue to follow.

## 2013-07-17 NOTE — Progress Notes (Signed)
ANTICOAGULATION CONSULT NOTE   Pharmacy Consult for heparin Indication: chest pain/ACS  Allergies  Allergen Reactions  . Bee Venom Anaphylaxis    Patient Measurements: Height: 5' 8.9" (175 cm) Weight: 145 lb 1 oz (65.8 kg) IBW/kg (Calculated) : 65.97 Heparin Dosing Weight: 65kg  Vital Signs: Temp: 97.7 F (36.5 C) (10/31 0943) Temp src: Oral (10/31 0943) BP: 140/81 mmHg (10/31 1305) Pulse Rate: 57 (10/31 1305)  Labs:  Recent Labs  07/17/13 1000 07/17/13 1223  HGB 14.8  --   HCT 40.7  --   PLT 232  --   LABPROT  --  13.2  INR  --  1.02  CREATININE 0.68  --   TROPONINI <0.30  --     Estimated Creatinine Clearance: 73.8 ml/min (by C-G formula based on Cr of 0.68).   Medical History: Past Medical History  Diagnosis Date  . Arthritis   . Asthma   . GERD (gastroesophageal reflux disease)   . COPD (chronic obstructive pulmonary disease)   . Hyperlipidemia   . Hypertension   . Osteoporosis   . PAD (peripheral artery disease)     a. s/p L fem-pop bypass b. recent evaluation at [facility], unamenable to intervention  . CHD (congenital heart disease)   . CAD (coronary artery disease)     Nonobstructive by remote cardiac cath  . PUD (peptic ulcer disease)   . Aortic atherosclerosis   . AAA (abdominal aortic aneurysm)     3.4 cm   Assessment: 62 year old female with hx of CAD; presents to [facility] with chest pain. Orders to initiate IV heparin with plans for LHC possibly this afternoon if able. Coags and cbc within normal limits.  Goal of Therapy:  Heparin level 0.3-0.7 units/ml Monitor platelets by anticoagulation protocol: Yes   Plan:  Give 4000 units bolus x 1 Start heparin infusion at 800 units/hr Check anti-Xa level in 6 hours and daily while on heparin -unless patient goes to cath Continue to monitor H&H and platelets  Sheppard Coil PharmD., BCPS Clinical Pharmacist Pager [phone] 07/17/2013 2:23 PM

## 2013-07-17 NOTE — Consult Note (Deleted)
Cardiology Consult Note   Patient ID: Marie Johnson MRN: [id number], DOB/AGE: 01/07/1951   Admit date: 07/17/2013 Date of Consult: 07/17/2013  Primary Physician: Ailene Ravel, MD Primary Cardiologist: Eloy End MD  Reason for consult: chest pain  HPI: Marie Johnson is a 62 y.o. female w/ PMHx s/f nonobstructive CAD (by remote prior cath, coronary calcifications incidentally found on chest CT, normal Lexiscan Myoview performed yesterday), significant ongoing tobacco abuse, PVD (s/p L fem-pop bypass x 15 years ago, now severe PVD unamenable to intervention), AAA, HTN, HLD, family h/o premature CAD (mother, 6s), COPD and GERD/PUD.  She had a heart cath performed by Dr. Glennon Hamilton several years ago which showed nonobstructive disease ("50-60% blockages per patient") in the setting of chest pain. Apparently an aortic aneurysm was identified at that time. Unclear location. She was referred to surgery earlier this month for a possible abdominal hernia. CT scan incidentally identified coronary calcifications in addition to renal artery calcifications and 3.4 cm AAA. No hernia seen. Gastric wall thickening noted. GI follow-up recommended. Surgery not indicated. She followed up with Dr. Delton See 10/27 for the coronary calcifications, endorsed exertional chest discomfort and chest pain and underwent Lexiscan Myoview 10/30 showing EF 73%, no evidence of ischemia or WMAs. Upon questioning about history of congenital heart disease she unable to describe further.   She reports experiencing exertional chest pressure and shortness of breath on minimal exertion such as walking up one flight of stairs, which improves shortly after rest. No prior rest pain until this morning. She was standing cooking breakfast and experienced sudden onset substernal chest burning and L sided sharp pain with radiation to her back and neck. Different from indigestion- h/o GERD and PUD. She had not yet eaten. She took NTG SL x 2  with some relief. She did note associated shortness of breath and headache. BP was markedly elevated (SBP 200s) when taken at home. She took a full-dose ASA. The discomfort persisted and she presented to the ED. She has a chronic cough. No sputum. Denies fevers, chills. No n/v/d. No unilateral leg redness or calf pain.   In the ED, EKG indicates sinus bradycardia (58 bpm) no ST/T changes). Initial TnI WNL. CMET- K 3.4, Na 133, LFTs, electrolytes, renal function and CBC otherwise WNL. CXR- no evidence of active cardiopulmonary disease. D-dimer did return elevated at 0.75. The chest pain has been intermittent since this morning never lasting > 20-30 minutes. VSS- not tachycardic or hypoxic, + tachypneic  Problem List: Past Medical History  Diagnosis Date  . Arthritis   . Asthma   . GERD (gastroesophageal reflux disease)   . COPD (chronic obstructive pulmonary disease)   . Hyperlipidemia   . Hypertension   . Osteoporosis   . PAD (peripheral artery disease)     a. s/p L fem-pop bypass b. recent evaluation at [facility], unamenable to intervention  . CHD (congenital heart disease)   . CAD (coronary artery disease)     Nonobstructive by remote cardiac cath  . PUD (peptic ulcer disease)     Past Surgical History  Procedure Laterality Date  . Vein bypass surgery      left leg  . Rotator cuff repair      right side, had torn ligaments as well  . Wrist arthrocentesis      left - broke in 3 places so has bars and screws placed  . Hip surgery      left - bars and screws placed  . Abdominal  hysterectomy    . Tubal ligation    . Cardiac catheterization      several years ago, nonobstructive, "50-60% blockages"     Allergies:  Allergies  Allergen Reactions  . Bee Venom Anaphylaxis    Home Medications: Prior to Admission medications   Medication Sig Start Date End Date Taking? Authorizing Provider  albuterol (PROVENTIL HFA;VENTOLIN HFA) 108 (90 BASE) MCG/ACT inhaler Inhale 2 puffs into the  lungs every 6 (six) hours as needed for wheezing.   Yes Historical Provider, MD  aspirin 325 MG tablet Take 325 mg by mouth daily.   Yes Historical Provider, MD  atenolol (TENORMIN) 50 MG tablet Take 50 mg by mouth 2 (two) times daily.   Yes Historical Provider, MD  furosemide (LASIX) 40 MG tablet Take 40 mg by mouth daily.   Yes Historical Provider, MD  LORazepam (ATIVAN) 1 MG tablet Take 1 mg by mouth 2 (two) times daily as needed for anxiety.   Yes Historical Provider, MD  losartan (COZAAR) 25 MG tablet Take 25 mg by mouth daily.   Yes Historical Provider, MD  nitroGLYCERIN (NITROSTAT) 0.4 MG SL tablet Place 0.4 mg under the tongue every 5 (five) minutes as needed for chest pain.   Yes Historical Provider, MD  Oxycodone HCl 10 MG TABS Take 10 mg by mouth 2 (two) times daily.   Yes Historical Provider, MD  pantoprazole (PROTONIX) 40 MG tablet Take 40 mg by mouth 2 (two) times daily.   Yes Historical Provider, MD  pravastatin (PRAVACHOL) 40 MG tablet Take 40 mg by mouth daily.   Yes Historical Provider, MD  traMADol (ULTRAM) 50 MG tablet Take 50 mg by mouth every 6 (six) hours as needed for pain.   Yes Historical Provider, MD    Inpatient Medications:  . nitroGLYCERIN  1 inch Topical Q6H    (Not in a hospital admission)  Family History  Problem Relation Age of Onset  . Cancer Mother     cervica  . Heart disease Mother   . Cancer Father     liver  . Heart disease Father   . Cancer Maternal Aunt     lung  . Cancer Maternal Uncle     prostate and anal  . Cancer Maternal Grandmother     breast  . Cancer Maternal Aunt     lung  . Cancer Maternal Aunt     lung  . Cancer Maternal Aunt     stomach  . Cancer Maternal Aunt     colon  . Cancer Maternal Uncle     lung     History   Social History  . Marital Status: Married    Spouse Name: N/A    Number of Children: N/A  . Years of Education: N/A   Occupational History  . Not on file.   Social History Main Topics  .  Smoking status: Current Every Day Smoker -- 0.50 packs/day  . Smokeless tobacco: Never Used  . Alcohol Use: No  . Drug Use: No  . Sexual Activity: Not on file   Other Topics Concern  . Not on file   Social History Narrative  . No narrative on file     Review of Systems: General: negative for chills, fever, night sweats or weight changes.  Cardiovascular: positive for chest pain, dyspnea on exertion, shortness of breath, negative for edema, orthopnea, palpitations, paroxysmal nocturnal dyspnea Dermatological: negative for rash Respiratory: positive for cough, negative for wheezing Urologic: negative for hematuria  Abdominal: negative for nausea, vomiting, diarrhea, bright red blood per rectum, melena, or hematemesis Neurologic: negative for visual changes, syncope, or dizziness All other systems reviewed and are otherwise negative except as noted above.  Physical Exam: Blood pressure 149/95, pulse 59, temperature 97.7 F (36.5 C), temperature source Oral, resp. rate 19, SpO2 100.00%.    General: Anxious, older-than-stated-age appearing female in no acute distress. Head: Normocephalic, atraumatic, sclera non-icteric, no xanthomas, nares are without discharge.  Neck: Negative for carotid bruits. JVD not elevated. Lungs: Distant breath sounds, scattered wheezing, rhonchi which clear with cough most notable at LLL. Speaking in short sentences.  Heart: Distant heart sounds, RRR with S1 S2. No murmurs, rubs, or gallops appreciated. Abdomen: Soft, tenderness to palpation to epigastrium, abdominal mass with active back flexion, non-distended with normoactive bowel sounds. No hepatomegaly. No rebound/guarding. No obvious abdominal masses. Msk:  Strength and tone appears normal for age. Extremities: No clubbing, cyanosis or edema.  Distal pedal pulses are trace and diminished bilaterally. Neuro: Alert and oriented X 3. Moves all extremities spontaneously. Psych:  Responds to questions  appropriately with a normal affect.  Labs: Recent Labs     07/17/13  1000  WBC  6.0  HGB  14.8  HCT  40.7  MCV  99.3  PLT  232   Recent Labs     07/17/13  1000  DDIMER  0.75*    Recent Labs Lab 07/17/13 1000  NA 133*  K 3.4*  CL 95*  CO2 23  BUN 13  CREATININE 0.68  CALCIUM 9.7  PROT 7.8  BILITOT 0.5  ALKPHOS 59  ALT 10  AST 16  GLUCOSE 105*   Recent Labs     07/17/13  1000  TROPONINI  <0.30   Radiology/Studies: Dg Chest 2 View  07/17/2013   CLINICAL DATA:  Chest pain  EXAM: CHEST  2 VIEW  COMPARISON:  06/22/2010  FINDINGS: Chronic interstitial markings. No focal consolidation. Calcified granuloma at the left lung base. Chronic elevation of the left hemidiaphragm with mild left basilar scarring. No pleural effusion or pneumothorax.  The heart is top-normal in size.  Degenerative changes of the visualized thoracolumbar spine. Mild anterior wedging of a mid thoracic vertebral body, likely chronic.  IMPRESSION: No evidence of acute cardiopulmonary disease.   Electronically Signed   By: Charline Bills M.D.   On: 07/17/2013 10:19   EKG: sinus bradycardia, 58 bpm, poor R wave progression, no ST/T changes  ASSESSMENT AND PLAN:   1. Exertional, progressing to resting chest pain 2. Known CAD with coronary artery calcifications on recent CT and normal Lexiscan Myoview 07/16/2013 3. Hypertensive urgency 4. PVD 5. AAA 6. Ongoing tobacco abuse 7. Uncontrolled HTN 8. Hyperlipidemia 9. Epigastric tenderness, gastric wall thickening on recent CT 10. Hypokalemia 11. Hyponatremia 12. Elevated D-dimer  The patient presents after experiencing intermittent substernal chest burning and left sided squeezing chest pain at rest this morning partially relieved with NTG. She was markedly hypertensive at home (SBP 200s). She  underwent Lexiscan Myoview yesterday after endorsing exertional chest pain and dyspnea, and after incidental coronary artery calcifications were appreciated  on CT. She has significant cardiac risk factors including significant tobacco abuse and is vasculopathic. Balanced ischemia certainly could produce a "normal" Myoview. Differential is broad at this point including PE (positive D-dimer, tachypneic, speaking in short sentences), aortic dissection (markedy hypertension, radiation to back), pleuritis (+ cough), gastritis/pancreatitis (+ tenderness to epigastrium palpation, gastric wall thickening on recent CT, gall bladder in tact). Pain  was nitro-responsive, but this could overlap with esophageal etiologies, angina and even dissection (by lowering BP). Based off normal Myoview, non-ischemic EKG and normal troponin, would rule out PE and dissection first. Discuss utility of diagnostic cardiac cath with MD. Obtain CT-A. Cycle cardiac biomarkers. NTG gtt for BP control vs oral agents.   Signed, R. Hurman Horn, PA-C 07/17/2013, 11:46 AM  Patient seen with PA, agree with the above note.  Patient is an active smoker with history of PAD.  She says she had a cardiac cath with some blockages that were not severe a number of years ago.  She has coronary artery calcifications by CT.  She also has significant COPD.  Patient has had exertional dyspnea for a long time.  However, for the last month she has had exertional chest tightness with walking up steps or walking more than about 50 yards.  This resolves with rest.  She saw cardiology and was set up for [facility] which was done yesterday and was normal.  Today, while making breakfast, she developed severe substernal chest pain that was persistent.  She took NTG sublingual with some, but not complete, relief.  Given persistent chest pain, she came to the ER.  ECG showed no acute changes and initial cardiac enzymes were negative.  She still has chest pain but not as severe.  It is not pleuritic.  BP is very high (SBP 190s-200s).    In the ER, CTA chest was done.  There was no PE.  There was coronary  calcification.  1. Chest pain: Given risk factors and history of exertional chest pain x 1 month then rest pain today, highly concerning for unstable angina.  She did have a negative Lexiscan Cardiolite but I am concerned this could have been a false negative (balanced ischemia) given symptoms. No PE.  I think that she is going to need cardiac catheterization given ongoing chest pain.   - ASA, NTG gtt - Prehydrate before she goes for [facility] and will limit contrast given PE CT already done today.  - No LV-gram, will get echo.  - heparin gtt if she does not go to cath lab soon.  - Atorvastatin 80 mg daily.  - GIven COPD, will stop atenolol and use bisoprolol (more beta-1 selective).  - Hold losartan given contrast load she will get today, will restart after cath.  2. PAD: Stable bilateral claudication.  S/p left fem-pop distantly.  Last peripheral angiogram at [facility] showed bilateral diffuse distal disease, no intervention.  3. COPD: Will continue home albuterol, add Spiriva.  Needs to quit smoking.   Marca Ancona 07/17/2013 12:18 PM

## 2013-07-17 NOTE — ED Provider Notes (Signed)
CSN: [id number]     Arrival date & time 07/17/13  4098 History   First MD Initiated Contact with Patient 07/17/13 1019     Chief Complaint  Patient presents with  . Chest Pain  . Dizziness   (Consider location/radiation/quality/duration/timing/severity/associated sxs/prior Treatment) Patient is a 62 y.o. female presenting with chest pain. The history is provided by the patient.  Chest Pain  patient here complaining of sudden onset of substernal chest pain while standing. Pain is been constant and she did use nitroglycerin with temporary relief. She had associated diaphoresis and dizziness. No radiation to her arms or neck. Had a nuclear stress test yesterday and according to the report was negative. No fever or cough. No leg pain or swelling. No other treatments used prior to arrival.  Past Medical History  Diagnosis Date  . Arthritis   . Asthma   . GERD (gastroesophageal reflux disease)   . COPD (chronic obstructive pulmonary disease)   . Hyperlipidemia   . Hypertension   . Osteoporosis   . PAD (peripheral artery disease)   . CHD (congenital heart disease)    Past Surgical History  Procedure Laterality Date  . Vein bypass surgery      left leg  . Rotator cuff repair      right side, had torn ligaments as well  . Wrist arthrocentesis      left - broke in 3 places so has bars and screws placed  . Hip surgery      left - bars and screws placed  . Abdominal hysterectomy    . Tubal ligation     Family History  Problem Relation Age of Onset  . Cancer Mother     cervica  . Heart disease Mother   . Cancer Father     liver  . Heart disease Father   . Cancer Maternal Aunt     lung  . Cancer Maternal Uncle     prostate and anal  . Cancer Maternal Grandmother     breast  . Cancer Maternal Aunt     lung  . Cancer Maternal Aunt     lung  . Cancer Maternal Aunt     stomach  . Cancer Maternal Aunt     colon  . Cancer Maternal Uncle     lung   History  Substance Use  Topics  . Smoking status: Current Every Day Smoker -- 0.50 packs/day  . Smokeless tobacco: Never Used  . Alcohol Use: No   OB History   Grav Para Term Preterm Abortions TAB SAB Ect Mult Living                 Review of Systems  Cardiovascular: Positive for chest pain.  All other systems reviewed and are negative.    Allergies  Bee venom  Home Medications   Current Outpatient Rx  Name  Route  Sig  Dispense  Refill  . albuterol (PROVENTIL HFA;VENTOLIN HFA) 108 (90 BASE) MCG/ACT inhaler   Inhalation   Inhale 2 puffs into the lungs every 6 (six) hours as needed for wheezing.         [name] aspirin 325 MG tablet   Oral   Take 325 mg by mouth daily.         [name] atenolol (TENORMIN) 50 MG tablet   Oral   Take 50 mg by mouth 2 (two) times daily.         . furosemide (LASIX) 40 MG tablet  Oral   Take 40 mg by mouth daily.         [name] LORazepam (ATIVAN) 1 MG tablet   Oral   Take 1 mg by mouth 2 (two) times daily as needed for anxiety.         [name] losartan (COZAAR) 25 MG tablet   Oral   Take 25 mg by mouth daily.         . nitroGLYCERIN (NITROSTAT) 0.4 MG SL tablet   Sublingual   Place 0.4 mg under the tongue every 5 (five) minutes as needed for chest pain.         . Oxycodone HCl 10 MG TABS   Oral   Take 10 mg by mouth 2 (two) times daily.         . pantoprazole (PROTONIX) 40 MG tablet   Oral   Take 40 mg by mouth 2 (two) times daily.         . pravastatin (PRAVACHOL) 40 MG tablet   Oral   Take 40 mg by mouth daily.         . traMADol (ULTRAM) 50 MG tablet   Oral   Take 50 mg by mouth every 6 (six) hours as needed for pain.          BP 176/100  Pulse 56  Temp(Src) 97.7 F (36.5 C) (Oral)  Resp 26  SpO2 100% Physical Exam  Nursing note and vitals reviewed. Constitutional: She is oriented to person, place, and time. She appears well-developed and well-nourished.  Non-toxic appearance. No distress.  HENT:  Head: Normocephalic and atraumatic.   Eyes: Conjunctivae, EOM and lids are normal. Pupils are equal, round, and reactive to light.  Neck: Normal range of motion. Neck supple. No tracheal deviation present. No mass present.  Cardiovascular: Normal rate, regular rhythm and normal heart sounds.  Exam reveals no gallop.   No murmur heard. Pulmonary/Chest: Effort normal and breath sounds normal. No stridor. No respiratory distress. She has no decreased breath sounds. She has no wheezes. She has no rhonchi. She has no rales.  Abdominal: Soft. Normal appearance and bowel sounds are normal. She exhibits no distension. There is no tenderness. There is no rebound and no CVA tenderness.  Musculoskeletal: Normal range of motion. She exhibits no edema and no tenderness.  Neurological: She is alert and oriented to person, place, and time. She has normal strength. No cranial nerve deficit or sensory deficit. GCS eye subscore is 4. GCS verbal subscore is 5. GCS motor subscore is 6.  Skin: Skin is warm and dry. No abrasion and no rash noted.  Psychiatric: She has a normal mood and affect. Her speech is normal and behavior is normal.    ED Course  Procedures (including critical care time) Labs Review Labs Reviewed  CBC WITH DIFFERENTIAL - Abnormal; Notable for the following:    MCH 36.1 (*)    MCHC 36.4 (*)    All other components within normal limits  COMPREHENSIVE METABOLIC PANEL  TROPONIN I  D-DIMER, QUANTITATIVE   Imaging Review Dg Chest 2 View  07/17/2013   CLINICAL DATA:  Chest pain  EXAM: CHEST  2 VIEW  COMPARISON:  06/22/2010  FINDINGS: Chronic interstitial markings. No focal consolidation. Calcified granuloma at the left lung base. Chronic elevation of the left hemidiaphragm with mild left basilar scarring. No pleural effusion or pneumothorax.  The heart is top-normal in size.  Degenerative changes of the visualized thoracolumbar spine. Mild anterior wedging of a mid thoracic  vertebral body, likely chronic.  IMPRESSION: No evidence  of acute cardiopulmonary disease.   Electronically Signed   By: Charline Bills M.D.   On: 07/17/2013 10:19    EKG Interpretation   None       MDM  No diagnosis found.  Date: 07/17/2013  Rate: 58  Rhythm: sinus bradycardia  QRS Axis: normal  Intervals: normal  ST/T Wave abnormalities: normal  Conduction Disutrbances:none  Narrative Interpretation:   Old EKG Reviewed: unchanged  Will give patient nitro paste have cardiology evaluate   Toy Baker, MD 07/17/13 1044

## 2013-07-17 NOTE — CV Procedure (Signed)
    Cardiac Catheterization Procedure Note  Name: Marie Johnson MRN: [id number] DOB: 27-Nov-1950  Procedure: Left Heart Cath, Selective Coronary Angiography  Indication: Unstable angina.  Patient presented with prolonged chest pain to ER.  Has history of exertional chest pain for the last month. Had negative Cardiolite yesterday, but given ongoing pain decision made for LHC.  CTA chest was negative for PE earlier.    Procedural Details: The right wrist was prepped, draped, and anesthetized with 1% lidocaine. Using the modified Seldinger technique, a 5 French sheath was introduced into the right radial artery. 3 mg of verapamil was administered through the sheath, weight-based unfractionated heparin was administered intravenously. Standard Judkins catheters were used for selective coronary angiography. No left ventriculography to conserve contrast (had CTA chest earlier in the day).  Catheter exchanges were performed over an exchange length guidewire. There were no immediate procedural complications. A TR band was used for radial hemostasis at the completion of the procedure.  The patient was transferred to the post catheterization recovery area for further monitoring.  30 cc of contrast was used.   Procedural Findings: Hemodynamics: AO 150/65 LV 159/17  Coronary angiography: Coronary dominance: right  Left mainstem: Very mild distal LM tapering.   Left anterior descending (LAD): Heavy proximal LAD calcification.  30-40% stenosis in the proximal LAD at the take-off of a small 2nd diagonal.   Left circumflex (LCx): Medium-sized ramus without signficant disease.  AV LCx with 30-40% ostial stenosis.  OM1 with 30% ostial stenosis.   Right coronary artery (RCA): There was prominent proximal to mid RCA calcification.  30% proximal, 30% mid RCA stenosis.   Left ventriculography: Not done to conserve contrast.  Normal EF on recent stress Cardiolite.    Final Conclusions:  Nonobstructive CAD.   I do not have an explanation for her chest pain.  No PE or dissection on chest CT.  I will keep her overnight to control her blood pressure.  Start amlodipine for BP control and in case this could be coronary vasospasm.  Will keep on NTG gtt for now while BP is still quite high.   Marca Ancona 07/17/2013, 4:19 PM

## 2013-07-17 NOTE — H&P (Signed)
Cardiology Admission Note   Patient ID: Marie Johnson MRN: [id number], DOB/AGE: 06-02-1951   Admit date: 07/17/2013 Date of Consult: 07/17/2013  Primary Physician: Ailene Ravel, MD Primary Cardiologist: Eloy End MD  Reason for consult: chest pain  HPI: Marie Johnson is a 62 y.o. female w/ PMHx s/f nonobstructive CAD (by remote prior cath, coronary calcifications incidentally found on chest CT, normal Lexiscan Myoview performed yesterday), significant ongoing tobacco abuse, PVD (s/p L fem-pop bypass x 15 years ago, now severe PVD unamenable to intervention), AAA, HTN, HLD, family h/o premature CAD (mother, 17s), COPD and GERD/PUD.  She had a heart cath performed by Dr. Glennon Hamilton several years ago which showed nonobstructive disease ("50-60% blockages per patient") in the setting of chest pain. Apparently an aortic aneurysm was identified at that time. Unclear location. She was referred to surgery earlier this month for a possible abdominal hernia. CT scan incidentally identified coronary calcifications in addition to renal artery calcifications and 3.4 cm AAA. No hernia seen. Gastric wall thickening noted. GI follow-up recommended. Surgery not indicated. She followed up with Dr. Delton See 10/27 for the coronary calcifications, endorsed exertional chest discomfort and chest pain and underwent Lexiscan Myoview 10/30 showing EF 73%, no evidence of ischemia or WMAs. Upon questioning about history of congenital heart disease she unable to describe further.   She reports experiencing exertional chest pressure and shortness of breath on minimal exertion such as walking up one flight of stairs, which improves shortly after rest. No prior rest pain until this morning. She was standing cooking breakfast and experienced sudden onset substernal chest burning and L sided sharp pain with radiation to her back and neck. Different from indigestion- h/o GERD and PUD. She had not yet eaten. She took NTG SL x 2  with some relief. She did note associated shortness of breath and headache. BP was markedly elevated (SBP 200s) when taken at home. She took a full-dose ASA. The discomfort persisted and she presented to the ED. She has a chronic cough. No sputum. Denies fevers, chills. No n/v/d. No unilateral leg redness or calf pain.   In the ED, EKG indicates sinus bradycardia (58 bpm) no ST/T changes). Initial TnI WNL. CMET- K 3.4, Na 133, LFTs, electrolytes, renal function and CBC otherwise WNL. CXR- no evidence of active cardiopulmonary disease. D-dimer did return elevated at 0.75. The chest pain has been intermittent since this morning never lasting > 20-30 minutes. VSS- not tachycardic or hypoxic, + tachypneic  Problem List: Past Medical History  Diagnosis Date  . Arthritis   . Asthma   . GERD (gastroesophageal reflux disease)   . COPD (chronic obstructive pulmonary disease)   . Hyperlipidemia   . Hypertension   . Osteoporosis   . PAD (peripheral artery disease)     a. s/p L fem-pop bypass b. recent evaluation at [facility], unamenable to intervention  . CHD (congenital heart disease)   . CAD (coronary artery disease)     Nonobstructive by remote cardiac cath  . PUD (peptic ulcer disease)   . Aortic atherosclerosis   . AAA (abdominal aortic aneurysm)     3.4 cm    Past Surgical History  Procedure Laterality Date  . Vein bypass surgery      left leg  . Rotator cuff repair      right side, had torn ligaments as well  . Wrist arthrocentesis      left - broke in 3 places so has bars and screws placed  .  Hip surgery      left - bars and screws placed  . Abdominal hysterectomy    . Tubal ligation    . Cardiac catheterization      several years ago, nonobstructive, "50-60% blockages"     Allergies:  Allergies  Allergen Reactions  . Bee Venom Anaphylaxis    Home Medications: Prior to Admission medications   Medication Sig Start Date End Date Taking? Authorizing Provider  albuterol  (PROVENTIL HFA;VENTOLIN HFA) 108 (90 BASE) MCG/ACT inhaler Inhale 2 puffs into the lungs every 6 (six) hours as needed for wheezing.   Yes Historical Provider, MD  aspirin 325 MG tablet Take 325 mg by mouth daily.   Yes Historical Provider, MD  atenolol (TENORMIN) 50 MG tablet Take 50 mg by mouth 2 (two) times daily.   Yes Historical Provider, MD  furosemide (LASIX) 40 MG tablet Take 40 mg by mouth daily.   Yes Historical Provider, MD  LORazepam (ATIVAN) 1 MG tablet Take 1 mg by mouth 2 (two) times daily as needed for anxiety.   Yes Historical Provider, MD  losartan (COZAAR) 25 MG tablet Take 25 mg by mouth daily.   Yes Historical Provider, MD  nitroGLYCERIN (NITROSTAT) 0.4 MG SL tablet Place 0.4 mg under the tongue every 5 (five) minutes as needed for chest pain.   Yes Historical Provider, MD  Oxycodone HCl 10 MG TABS Take 10 mg by mouth 2 (two) times daily.   Yes Historical Provider, MD  pantoprazole (PROTONIX) 40 MG tablet Take 40 mg by mouth 2 (two) times daily.   Yes Historical Provider, MD  pravastatin (PRAVACHOL) 40 MG tablet Take 40 mg by mouth daily.   Yes Historical Provider, MD  traMADol (ULTRAM) 50 MG tablet Take 50 mg by mouth every 6 (six) hours as needed for pain.   Yes Historical Provider, MD    Inpatient Medications:  . albuterol  2.5 mg Nebulization Q4H  . aspirin  325 mg Oral Daily  . [START ON 07/18/2013] atorvastatin  80 mg Oral Q0600  . bisoprolol  5 mg Oral Daily  . pantoprazole  40 mg Oral BID  . sodium chloride  3 mL Intravenous Q12H  . sodium chloride  3 mL Intravenous Q12H  . tiotropium  18 mcg Inhalation Daily   Prescriptions prior to admission  Medication Sig Dispense Refill  . albuterol (PROVENTIL HFA;VENTOLIN HFA) 108 (90 BASE) MCG/ACT inhaler Inhale 2 puffs into the lungs every 6 (six) hours as needed for wheezing.      [name] aspirin 325 MG tablet Take 325 mg by mouth daily.      [name] atenolol (TENORMIN) 50 MG tablet Take 50 mg by mouth 2 (two) times daily.      .  furosemide (LASIX) 40 MG tablet Take 40 mg by mouth daily.      [name] LORazepam (ATIVAN) 1 MG tablet Take 1 mg by mouth 2 (two) times daily as needed for anxiety.      [name] losartan (COZAAR) 25 MG tablet Take 25 mg by mouth daily.      . nitroGLYCERIN (NITROSTAT) 0.4 MG SL tablet Place 0.4 mg under the tongue every 5 (five) minutes as needed for chest pain.      . Oxycodone HCl 10 MG TABS Take 10 mg by mouth 2 (two) times daily.      . pantoprazole (PROTONIX) 40 MG tablet Take 40 mg by mouth 2 (two) times daily.      . pravastatin (PRAVACHOL) 40 MG  tablet Take 40 mg by mouth daily.      . traMADol (ULTRAM) 50 MG tablet Take 50 mg by mouth every 6 (six) hours as needed for pain.        Family History  Problem Relation Age of Onset  . Cancer Mother     cervica  . Heart disease Mother     MI in 62s  . Cancer Father     liver  . Heart disease Father   . Cancer Maternal Aunt     lung  . Cancer Maternal Uncle     prostate and anal  . Cancer Maternal Grandmother     breast  . Cancer Maternal Aunt     lung  . Cancer Maternal Aunt     lung  . Cancer Maternal Aunt     stomach  . Cancer Maternal Aunt     colon  . Cancer Maternal Uncle     lung     History   Social History  . Marital Status: Married    Spouse Name: N/A    Number of Children: N/A  . Years of Education: N/A   Occupational History  . Not on file.   Social History Main Topics  . Smoking status: Current Every Day Smoker -- 1.00 packs/day    Types: Cigarettes  . Smokeless tobacco: Never Used  . Alcohol Use: No  . Drug Use: No  . Sexual Activity: Not on file   Other Topics Concern  . Not on file   Social History Narrative  . No narrative on file     Review of Systems: General: negative for chills, fever, night sweats or weight changes.  Cardiovascular: positive for chest pain, dyspnea on exertion, shortness of breath, negative for edema, orthopnea, palpitations, paroxysmal nocturnal dyspnea Dermatological:  negative for rash Respiratory: positive for cough, negative for wheezing Urologic: negative for hematuria Abdominal: negative for nausea, vomiting, diarrhea, bright red blood per rectum, melena, or hematemesis Neurologic: negative for visual changes, syncope, or dizziness All other systems reviewed and are otherwise negative except as noted above.  Physical Exam: Blood pressure 140/81, pulse 57, temperature 97.7 F (36.5 C), temperature source Oral, resp. rate 13, height 5' 8.9" (1.75 m), weight 65.8 kg (145 lb 1 oz), SpO2 97.00%.    General: Anxious, older-than-stated-age appearing female in no acute distress. Head: Normocephalic, atraumatic, sclera non-icteric, no xanthomas, nares are without discharge.  Neck: Negative for carotid bruits. JVD not elevated. Lungs: Distant breath sounds, scattered wheezing, rhonchi which clear with cough most notable at LLL. Speaking in short sentences.  Heart: Distant heart sounds, RRR with S1 S2. No murmurs, rubs, or gallops appreciated. Abdomen: Soft, tenderness to palpation to epigastrium, abdominal mass with active back flexion, non-distended with normoactive bowel sounds. No hepatomegaly. No rebound/guarding. No obvious abdominal masses. Msk:  Strength and tone appears normal for age. Extremities: No clubbing, cyanosis or edema.  Distal pedal pulses are trace and diminished bilaterally. Neuro: Alert and oriented X 3. Moves all extremities spontaneously. Psych:  Responds to questions appropriately with a normal affect.  Labs: Recent Labs     07/17/13  1000  WBC  6.0  HGB  14.8  HCT  40.7  MCV  99.3  PLT  232   Recent Labs     07/17/13  1000  DDIMER  0.75*    Recent Labs Lab 07/17/13 1000  NA 133*  K 3.4*  CL 95*  CO2 23  BUN 13  CREATININE 0.68  CALCIUM 9.7  PROT 7.8  BILITOT 0.5  ALKPHOS 59  ALT 10  AST 16  GLUCOSE 105*   Recent Labs     07/17/13  1000  TROPONINI  <0.30   Radiology/Studies: Dg Chest 2  View  07/17/2013   CLINICAL DATA:  Chest pain  EXAM: CHEST  2 VIEW  COMPARISON:  06/22/2010  FINDINGS: Chronic interstitial markings. No focal consolidation. Calcified granuloma at the left lung base. Chronic elevation of the left hemidiaphragm with mild left basilar scarring. No pleural effusion or pneumothorax.  The heart is top-normal in size.  Degenerative changes of the visualized thoracolumbar spine. Mild anterior wedging of a mid thoracic vertebral body, likely chronic.  IMPRESSION: No evidence of acute cardiopulmonary disease.   Electronically Signed   By: Charline Bills M.D.   On: 07/17/2013 10:19   EKG: sinus bradycardia, 58 bpm, poor R wave progression, no ST/T changes  ASSESSMENT AND PLAN:   1. Exertional, progressing to resting chest pain 2. Known CAD with coronary artery calcifications on recent CT and normal Lexiscan Myoview 07/16/2013 3. Hypertensive urgency 4. PVD 5. AAA 6. Ongoing tobacco abuse 7. Uncontrolled HTN 8. Hyperlipidemia 9. Epigastric tenderness, gastric wall thickening on recent CT 10. Hypokalemia 11. Hyponatremia 12. Elevated D-dimer  The patient presents after experiencing intermittent substernal chest burning and left sided squeezing chest pain at rest this morning partially relieved with NTG. She was markedly hypertensive at home (SBP 200s). She  underwent Lexiscan Myoview yesterday after endorsing exertional chest pain and dyspnea, and after incidental coronary artery calcifications were appreciated on CT. She has significant cardiac risk factors including significant tobacco abuse and is vasculopathic. Balanced ischemia certainly could produce a "normal" Myoview. Differential is broad at this point including PE (positive D-dimer, tachypneic, speaking in short sentences), aortic dissection (markedy hypertension, radiation to back), pleuritis (+ cough), gastritis/pancreatitis (+ tenderness to epigastrium palpation, gastric wall thickening on recent CT, gall  bladder in tact). Pain was nitro-responsive, but this could overlap with esophageal etiologies, angina and even dissection (by lowering BP). Based off normal Myoview, non-ischemic EKG and normal troponin, would rule out PE and dissection first. Discuss utility of diagnostic cardiac cath with MD. Obtain CT-A. Cycle cardiac biomarkers. NTG gtt for BP control vs oral agents.   Signed, R. Hurman Horn, PA-C 07/17/2013, 3:10 PM  Patient seen with PA, agree with the above note.  Patient is an active smoker with history of PAD.  She says she had a cardiac cath with some blockages that were not severe a number of years ago.  She has coronary artery calcifications by CT.  She also has significant COPD.  Patient has had exertional dyspnea for a long time.  However, for the last month she has had exertional chest tightness with walking up steps or walking more than about 50 yards.  This resolves with rest.  She saw cardiology and was set up for [facility] which was done yesterday and was normal.  Today, while making breakfast, she developed severe substernal chest pain that was persistent.  She took NTG sublingual with some, but not complete, relief.  Given persistent chest pain, she came to the ER.  ECG showed no acute changes and initial cardiac enzymes were negative.  She still has chest pain but not as severe.  It is not pleuritic.  BP is very high (SBP 190s-200s).    In the ER, CTA chest was done.  There was no PE.  There was coronary calcification.  1.  Chest pain: Given risk factors and history of exertional chest pain x 1 month then rest pain today, highly concerning for unstable angina.  She did have a negative Lexiscan Cardiolite but I am concerned this could have been a false negative (balanced ischemia) given symptoms. No PE.  I think that she is going to need cardiac catheterization given ongoing chest pain.   - ASA, NTG gtt - Prehydrate before she goes for [facility] and will limit contrast given PE CT  already done today.  - No LV-gram, will get echo.  - heparin gtt if she does not go to cath lab soon.  - Atorvastatin 80 mg daily.  - GIven COPD, will stop atenolol and use bisoprolol (more beta-1 selective).  - Hold losartan given contrast load she will get today, will restart after cath.  2. PAD: Stable bilateral claudication.  S/p left fem-pop distantly.  Last peripheral angiogram at [facility] showed bilateral diffuse distal disease, no intervention.  3. COPD: Will continue home albuterol, add Spiriva.  Needs to quit smoking.   Marca Ancona 07/17/2013 3:10 PM

## 2013-07-17 NOTE — ED Notes (Signed)
Pt c/o mid sternal CP with SOB, cough and dizziness; pt sts had stress test yesterday

## 2013-07-17 NOTE — Interval H&P Note (Signed)
Cath Lab Visit (complete for each Cath Lab visit)  Clinical Evaluation Leading to the Procedure:   ACS: yes  Non-ACS:    Anginal Classification: CCS IV  Anti-ischemic medical therapy: Minimal Therapy (1 class of medications)  Non-Invasive Test Results: Low risk noninvasive testing.   Prior CABG: No previous CABG      History and Physical Interval Note:  07/17/2013 3:35 PM  Marie Johnson  has presented today for surgery, with the diagnosis of cp  The various methods of treatment have been discussed with the patient and family. After consideration of risks, benefits and other options for treatment, the patient has consented to  Procedure(s): LEFT HEART CATHETERIZATION WITH CORONARY ANGIOGRAM (N/A) as a surgical intervention .  The patient's history has been reviewed, patient examined, no change in status, stable for surgery.  I have reviewed the patient's chart and labs.  Questions were answered to the patient's satisfaction.     Annaleigh Steinmeyer [employer]

## 2013-07-18 DIAGNOSIS — I517 Cardiomegaly: Secondary | ICD-10-CM

## 2013-07-18 LAB — CBC
Hemoglobin: 13.8 g/dL (ref 12.0–15.0)
MCH: 35.3 pg — ABNORMAL HIGH (ref 26.0–34.0)
MCV: 100.5 fL — ABNORMAL HIGH (ref 78.0–100.0)
Platelets: 217 10*3/uL (ref 150–400)
RBC: 3.91 MIL/uL (ref 3.87–5.11)
WBC: 4.9 10*3/uL (ref 4.0–10.5)

## 2013-07-18 LAB — BASIC METABOLIC PANEL
CO2: 25 mEq/L (ref 19–32)
Calcium: 9.2 mg/dL (ref 8.4–10.5)
Chloride: 104 mEq/L (ref 96–112)
GFR calc non Af Amer: 88 mL/min — ABNORMAL LOW (ref >90)
Glucose, Bld: 93 mg/dL (ref 70–99)
Potassium: 4.7 mEq/L (ref 3.5–5.1)
Sodium: 138 mEq/L (ref 135–145)

## 2013-07-18 LAB — HEMOGLOBIN A1C: Mean Plasma Glucose: 128 mg/dL — ABNORMAL HIGH (ref <117)

## 2013-07-18 LAB — TSH: TSH: 1.009 u[IU]/mL (ref 0.350–4.500)

## 2013-07-18 MED ORDER — BISOPROLOL FUMARATE 5 MG PO TABS
5.0000 mg | ORAL_TABLET | Freq: Two times a day (BID) | ORAL | Status: DC
  Administered 2013-07-18 – 2013-07-19 (×2): 5 mg via ORAL
  Filled 2013-07-18 (×3): qty 1

## 2013-07-18 MED ORDER — LOSARTAN POTASSIUM 50 MG PO TABS
50.0000 mg | ORAL_TABLET | Freq: Two times a day (BID) | ORAL | Status: DC
  Filled 2013-07-18: qty 1

## 2013-07-18 MED ORDER — AMLODIPINE BESYLATE 5 MG PO TABS
5.0000 mg | ORAL_TABLET | Freq: Two times a day (BID) | ORAL | Status: DC
  Administered 2013-07-18 – 2013-07-19 (×2): 5 mg via ORAL
  Filled 2013-07-18 (×3): qty 1

## 2013-07-18 MED ORDER — OXYCODONE HCL 5 MG PO TABS
10.0000 mg | ORAL_TABLET | Freq: Two times a day (BID) | ORAL | Status: DC | PRN
  Administered 2013-07-18 – 2013-07-19 (×2): 10 mg via ORAL
  Filled 2013-07-18 (×2): qty 2

## 2013-07-18 MED ORDER — SPIRONOLACTONE 25 MG PO TABS
25.0000 mg | ORAL_TABLET | Freq: Every day | ORAL | Status: DC
  Administered 2013-07-18 – 2013-07-19 (×2): 25 mg via ORAL
  Filled 2013-07-18 (×2): qty 1

## 2013-07-18 MED ORDER — MORPHINE SULFATE 2 MG/ML IJ SOLN
INTRAMUSCULAR | Status: AC
  Administered 2013-07-18: 2 mg via INTRAVENOUS
  Filled 2013-07-18: qty 1

## 2013-07-18 MED ORDER — MORPHINE SULFATE 2 MG/ML IJ SOLN: 2.0000 mg | INTRAMUSCULAR | Status: DC | PRN

## 2013-07-18 MED ORDER — LOSARTAN POTASSIUM 25 MG PO TABS
25.0000 mg | ORAL_TABLET | Freq: Every day | ORAL | Status: DC
  Administered 2013-07-18 – 2013-07-19 (×2): 25 mg via ORAL
  Filled 2013-07-18 (×2): qty 1

## 2013-07-18 MED ORDER — FUROSEMIDE 40 MG PO TABS
40.0000 mg | ORAL_TABLET | Freq: Every day | ORAL | Status: DC
  Administered 2013-07-18 – 2013-07-19 (×2): 40 mg via ORAL
  Filled 2013-07-18 (×2): qty 1

## 2013-07-18 MED ORDER — POLYETHYLENE GLYCOL 3350 17 G PO PACK
17.0000 g | PACK | Freq: Every day | ORAL | Status: DC
  Administered 2013-07-18 – 2013-07-19 (×2): 17 g via ORAL
  Filled 2013-07-18 (×2): qty 1

## 2013-07-18 NOTE — Progress Notes (Signed)
  Echocardiogram 2D Echocardiogram has been performed.  Unknown Schleyer, [facility] 07/18/2013, 9:54 AM

## 2013-07-18 NOTE — Progress Notes (Signed)
Subjective:  Headache overnight on nitroglycerin. Labile blood pressure overnight. Vague chest pain when up to bedside commode.  Objective:  Vital Signs in the last 24 hours: BP 150/87  Pulse 71  Temp(Src) 98.7 F (37.1 C) (Oral)  Resp 16  Ht 5' 8.9" (1.75 m)  Wt 66.1 kg (145 lb 11.6 oz)  BMI 21.58 kg/m2  SpO2 96%  Physical Exam: Then white female in no acute distress Lungs:  Clear Cardiac:  Regular rhythm, normal S1 and S2, no S3 Abdomen:  Soft, nontender, no masses Extremities:  Radial catheterization site is stable, peripheral pulses diminished  Intake/Output from previous day: 10/31 0701 - 11/01 0700 In: 1532.9 [I.V.:1532.9] Out: 950 [Urine:950]  Weight Filed Weights   07/17/13 1416 07/18/13 0500  Weight: 65.8 kg (145 lb 1 oz) 66.1 kg (145 lb 11.6 oz)    Lab Results: Basic Metabolic Panel:  Recent Labs  19/14/78 1000 07/17/13 2000 07/18/13 0417  NA 133*  --  138  K 3.4*  --  4.7  CL 95*  --  104  CO2 23  --  25  GLUCOSE 105*  --  93  BUN 13  --  9  CREATININE 0.68 0.77 0.74   CBC:  Recent Labs  07/17/13 1000 07/17/13 2000 07/18/13 0417  WBC 6.0 6.1 4.9  NEUTROABS 3.4  --   --   HGB 14.8 13.3 13.8  HCT 40.7 37.7 39.3  MCV 99.3 100.0 100.5*  PLT 232 214 217   Cardiac Enzymes:  Recent Labs  07/17/13 1000 07/17/13 2000 07/17/13 2208  TROPONINI <0.30 <0.30 <0.30    Telemetry: Sinus rhythm  Assessment/Plan:  1. Nonobstructive coronary artery disease 2. Accelerated hypertension 3. Severe peripheral vascular disease 4. Hypertensive heart disease  Recommendations:  Discontinue nitroglycerin. Add spironolactone and diuretics to help control blood pressure. Add ACE inhibitor and watch potassium. Moved to the floor. Possible home soon.     Darden Palmer  MD [facility]  07/18/2013, 11:31 AM

## 2013-07-18 NOTE — Progress Notes (Signed)
Pt arrived on floor. Assisted to bedside chair. Family present at bedside. Telemetry connected, Vital signs stable. Will continue to monitor.

## 2013-07-18 NOTE — Progress Notes (Signed)
CARDIAC REHAB PHASE I   PRE:  Rate/Rhythm: 58 SB  BP:  Supine: 154/63  Sitting:   Standing:    SaO2: 100 RA  MODE:  Ambulation: 270 ft   POST:  Rate/Rhythm: 74 SR  BP:  Supine:   Sitting: 135/90  Standing:    SaO2: 99 RA 1150-1235 Assisted X 1 to ambulate. Slow steady pace. Pt able to walk 270 feet without c/o of cp. She does c/o of some SOB. Pt to recliner after walk with call light in reach. Completed some discharge education with pt. Discussed smoking cessation with her. I gave her tips for quitting and coaching contact number. Pt states that she is going to try harder that she has in the past to quit this time. Her husband smokes so it will be hard for to be successful. Discussed heart healthy diet, exercise and taking medications. Pt does not seem committed to making changes, but I have encouraged her to try.  Melina Copa RN 07/18/2013 12:33 PM

## 2013-07-19 ENCOUNTER — Encounter (HOSPITAL_COMMUNITY): Payer: Self-pay | Admitting: Physician Assistant

## 2013-07-19 ENCOUNTER — Other Ambulatory Visit: Payer: Self-pay | Admitting: Physician Assistant

## 2013-07-19 DIAGNOSIS — I10 Essential (primary) hypertension: Secondary | ICD-10-CM

## 2013-07-19 MED ORDER — BISOPROLOL FUMARATE 5 MG PO TABS: 5.0000 mg | ORAL_TABLET | Freq: Two times a day (BID) | ORAL | Status: DC

## 2013-07-19 MED ORDER — SPIRONOLACTONE 25 MG PO TABS: 25.0000 mg | ORAL_TABLET | Freq: Every day | ORAL | Status: DC

## 2013-07-19 MED ORDER — AMLODIPINE BESYLATE 5 MG PO TABS: 5.0000 mg | ORAL_TABLET | Freq: Two times a day (BID) | ORAL | Status: DC

## 2013-07-19 MED ORDER — LORAZEPAM 1 MG PO TABS: 1.0000 mg | ORAL_TABLET | Freq: Two times a day (BID) | ORAL | Status: DC | PRN

## 2013-07-19 MED ORDER — ATORVASTATIN CALCIUM 80 MG PO TABS: 80.0000 mg | ORAL_TABLET | Freq: Every day | ORAL | Status: DC

## 2013-07-19 NOTE — Progress Notes (Signed)
Report received from Tim, RN on 2 H. Awaiting arrival of pt.

## 2013-07-19 NOTE — Discharge Summary (Signed)
Discharge Summary   Patient ID: BERMA HARTS MRN: [id number], DOB/AGE: 1950-09-24 62 y.o. Admit date: 07/17/2013 D/C date:     07/19/2013  Primary Cardiologist: Delton See  Primary Discharge Diagnoses:  1. Chest pain, suspect due to uncontrolled hypertension or coronary vasospasm 2. 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 Anxiety 4. HTN, uncontrolled on admission 5. Ongoing tobacco above 6. PVD (s/p L fem-pop bypass x 15 years ago, now severe PVD unamenable to intervention) 7. COPD  Secondary Discharge Diagnoses:  1. Arthritis 2. Asthma 3. HLD 4. Osteoporosis 5. Congenital heart disease 6. Aortic atherosclerosis 7. PUD 8. GERD 9. AAA  Hospital Course: Ms. Swanton is a 62 y/o F with history of nonobstructive CAD (by remote prior cath, coronary calcifications incidentally found on chest CT, normal Lexiscan Myoview performed yesterday), significant ongoing tobacco abuse, PVD (s/p L fem-pop bypass x 15 years ago, now severe PVD unamenable to intervention), AAA, HTN, HLD, COPD and GERD/PUD. She had a heart cath performed by Dr. Glennon Hamilton several years ago which showed nonobstructive disease ("50-60% blockages per patient") in the setting of chest pain. Apparently an aortic aneurysm was identified at that time, unclear location. She was referred to surgery earlier this month for a possible abdominal hernia. CT scan incidentally identified coronary calcifications in addition to renal artery calcifications and 3.4 cm AAA, no hernia seen, gastric wall thickening noted. GI follow-up recommended. Surgery not indicated. She followed up with Dr. Delton See 10/27 for the coronary calcifications, endorsed exertional chest discomfort and chest pain and underwent Lexiscan Myoview 10/30 showing EF 73%, no evidence of ischemia or WMAs. Upon questioning about history of congenital heart disease she was unable to describe further.   She presented to [facility] with exertional chest pressure and shortness  of breath on minimal exertion such as walking up one flight of stairs, improving shortly after rest. On day of admission she had rest pain - she cooking breakfast and experienced sudden onset substernal chest burning and L sided sharp pain with radiation to her back and neck. She took a full dose ASA and came to the ER. BP was markedly elevated. EKG showed no acute ST-T changes and initial troponin was negative. CTA was negative for PE or dissection. She was placed on ASA and nitro drip and taken to the cath lab to define anatomy. This demonstrated nonobstructive CAD as below. There was no formal explanation for her chest pain. Norvasc was started in case this was coronary vasospasm. Troponins remained negative. 2D echo was obtained showing mild-mod LVH, normal EF 60% without RWMA. Blood pressure meds were futher titrated- atenolol was changed to more selective bisoprolol given COPD. Spironolactone was added and Lasix was resumed. The patient required lorazepam for anxiety during admission. Lipase and LFTs were normal. Blood pressure is better controlled at this time. Dr. Myrtis Ser has seen and examined the patient today and feels she is stable for discharge. She was given limited rx lorazepam at discharge for anxiety and instructed to monitor BP at home. I have left a message on our office's scheduling voicemail requesting a follow-up appointment, and our office will call the patient with this appointment. I also left a message for a BMET in our office within the next week given med adjustment as above this admission.  Consider outpatient f/u labs 6-8 weeks given switch from pravastatin to lipitor this admission.  Discharge Vitals: Blood pressure 144/74, pulse 53, temperature 98.2 F (36.8 C), temperature source Oral, resp. rate 18, height 5' 8.9" (  1.75 m), weight 143 lb 12.8 oz (65.227 kg), SpO2 100.00%.  Labs: Lab Results  Component Value Date   WBC 4.9 07/18/2013   HGB 13.8 07/18/2013   HCT 39.3 07/18/2013     MCV 100.5* 07/18/2013   PLT 217 07/18/2013    Recent Labs Lab 07/17/13 1000  07/18/13 0417  NA 133*  --  138  K 3.4*  --  4.7  CL 95*  --  104  CO2 23  --  25  BUN 13  --  9  CREATININE 0.68  < > 0.74  CALCIUM 9.7  --  9.2  PROT 7.8  --   --   BILITOT 0.5  --   --   ALKPHOS 59  --   --   ALT 10  --   --   AST 16  --   --   GLUCOSE 105*  --  93  < > = values in this interval not displayed.  Recent Labs  07/17/13 1000 07/17/13 2000 07/17/13 2208  TROPONINI <0.30 <0.30 <0.30   No results found for this basename: CHOL,  HDL,  LDLCALC,  TRIG   Lab Results  Component Value Date   DDIMER 0.75* 07/17/2013    Diagnostic Studies/Procedures   Dg Chest 2 View  07/17/2013   CLINICAL DATA:  Chest pain  EXAM: CHEST  2 VIEW  COMPARISON:  06/22/2010  FINDINGS: Chronic interstitial markings. No focal consolidation. Calcified granuloma at the left lung base. Chronic elevation of the left hemidiaphragm with mild left basilar scarring. No pleural effusion or pneumothorax.  The heart is top-normal in size.  Degenerative changes of the visualized thoracolumbar spine. Mild anterior wedging of a mid thoracic vertebral body, likely chronic.  IMPRESSION: No evidence of acute cardiopulmonary disease.   Electronically Signed   By: Charline Bills M.D.   On: 07/17/2013 10:19   Ct Angio Chest Pe W/cm &/or Wo Cm  07/17/2013   CLINICAL DATA:  Midsternal chest pain shortness of breath, cough  EXAM: CT ANGIOGRAPHY CHEST WITH CONTRAST  TECHNIQUE: Multidetector CT imaging of the chest was performed using the standard protocol during bolus administration of intravenous contrast. Multiplanar CT image reconstructions including MIPs were obtained to evaluate the vascular anatomy.  CONTRAST:  80mL OMNIPAQUE IOHEXOL 350 MG/ML SOLN  COMPARISON:  Chest radiographs dated 07/17/2013  FINDINGS: No evidence of pulmonary embolism.  Mild centrilobular emphysematous changes. Calcified left lower lobe granulomata. No  suspicious pulmonary nodules. No pleural effusion or pneumothorax.  Visualized thyroid is unremarkable.  Cardiomegaly. No pericardial effusion. Coronary atherosclerosis. Atherosclerotic calcifications of the aortic arch.  Calcified mediastinal nodes. No suspicious mediastinal, hilar, or axillary lymphadenopathy.  Visualized upper abdomen is notable for splenic granulomata.  Degenerative changes of the visualized thoracolumbar spine.  Review of the MIP images confirms the above findings.  IMPRESSION: No evidence of pulmonary embolism.  Mild emphysematous changes.  Sequela of prior granulomatous disease.   Electronically Signed   By: Charline Bills M.D.   On: 07/17/2013 12:00   2D echo 07/18/13 - Left ventricle: The cavity size was normal. Wall thickness was increased increased in a pattern of mild to moderate LVH. The estimated ejection fraction was 60%. Wall motion was normal; there were no regional wall motion abnormalities. - Left atrium: The atrium was mildly dilated. - Right ventricle: The cavity size was normal. Systolic function was normal.  Cath 07/17/13 Cardiac Catheterization Procedure Note  Name: VINCENZINA JAGODA  MRN: [id number]  DOB: 1951/09/07  Procedure: Left Heart Cath, Selective Coronary Angiography  Indication: Unstable angina. Patient presented with prolonged chest pain to ER. Has history of exertional chest pain for the last month. Had negative Cardiolite yesterday, but given ongoing pain decision made for LHC. CTA chest was negative for PE earlier.  Procedural Details: The right wrist was prepped, draped, and anesthetized with 1% lidocaine. Using the modified Seldinger technique, a 5 French sheath was introduced into the right radial artery. 3 mg of verapamil was administered through the sheath, weight-based unfractionated heparin was administered intravenously. Standard Judkins catheters were used for selective coronary angiography. No left ventriculography to conserve contrast  (had CTA chest earlier in the day). Catheter exchanges were performed over an exchange length guidewire. There were no immediate procedural complications. A TR band was used for radial hemostasis at the completion of the procedure. The patient was transferred to the post catheterization recovery area for further monitoring.  30 cc of contrast was used.  Procedural Findings:  Hemodynamics:  AO 150/65  LV 159/17  Coronary angiography:  Coronary dominance: right  Left mainstem: Very mild distal LM tapering.  Left anterior descending (LAD): Heavy proximal LAD calcification. 30-40% stenosis in the proximal LAD at the take-off of a small 2nd diagonal.  Left circumflex (LCx): Medium-sized ramus without signficant disease. AV LCx with 30-40% ostial stenosis. OM1 with 30% ostial stenosis.  Right coronary artery (RCA): There was prominent proximal to mid RCA calcification. 30% proximal, 30% mid RCA stenosis.  Left ventriculography: Not done to conserve contrast. Normal EF on recent stress Cardiolite.  Final Conclusions: Nonobstructive CAD. I do not have an explanation for her chest pain. No PE or dissection on chest CT. I will keep her overnight to control her blood pressure. Start amlodipine for BP control and in case this could be coronary vasospasm. Will keep on NTG gtt for now while BP is still quite high.    Discharge Medications     Medication List    STOP taking these medications       atenolol 50 MG tablet  Commonly known as:  TENORMIN     pravastatin 40 MG tablet  Commonly known as:  PRAVACHOL      TAKE these medications       albuterol 108 (90 BASE) MCG/ACT inhaler  Commonly known as:  PROVENTIL HFA;VENTOLIN HFA  Inhale 2 puffs into the lungs every 6 (six) hours as needed for wheezing.     amLODipine 5 MG tablet  Commonly known as:  NORVASC  Take 1 tablet (5 mg total) by mouth 2 (two) times daily.     aspirin 325 MG tablet  Take 325 mg by mouth daily.     atorvastatin 80 MG  tablet  Commonly known as:  LIPITOR  Take 1 tablet (80 mg total) by mouth daily.     bisoprolol 5 MG tablet  Commonly known as:  ZEBETA  Take 1 tablet (5 mg total) by mouth 2 (two) times daily.     furosemide 40 MG tablet  Commonly known as:  LASIX  Take 40 mg by mouth daily.     LORazepam 1 MG tablet  Commonly known as:  ATIVAN  Take 1 tablet (1 mg total) by mouth 2 (two) times daily as needed for anxiety.     losartan 25 MG tablet  Commonly known as:  COZAAR  Take 25 mg by mouth daily.     nitroGLYCERIN 0.4 MG SL tablet  Commonly known as:  NITROSTAT  Place  0.4 mg under the tongue every 5 (five) minutes as needed for chest pain.     Oxycodone HCl 10 MG Tabs  Take 10 mg by mouth 2 (two) times daily.     pantoprazole 40 MG tablet  Commonly known as:  PROTONIX  Take 40 mg by mouth 2 (two) times daily.     spironolactone 25 MG tablet  Commonly known as:  ALDACTONE  Take 1 tablet (25 mg total) by mouth daily.     traMADol 50 MG tablet  Commonly known as:  ULTRAM  Take 50 mg by mouth every 6 (six) hours as needed for pain.        Disposition   The patient will be discharged in stable condition to home. Discharge Orders   Future Appointments Provider Department Dept Phone   08/10/2013 11:45 AM Lars Masson, MD [facility] [place] Office [phone]   Future Orders Complete By Expires   Diet - low sodium heart healthy  As directed    Discharge instructions  As directed    Comments:     Please monitor your blood pressure occasionally at home. Call your doctor if you tend to get readings of greater than 130 on the top number or 80 on the bottom number.   Increase activity slowly  As directed    Comments:     No driving for 1 day. No lifting over 5 lbs for 1 week. No sexual activity for 1 week. You may return to work in 1 week. Keep procedure site clean & dry. If you notice increased pain, swelling, bleeding or pus, call/return!  You may shower, but no soaking  baths/hot tubs/pools for 1 week.     Follow-up Information   Follow up with Tobias Alexander, Rexene Edison, MD. (Our office will call you for a follow-up appointment. Please call the office if you have not heard from [place] within 3 days.)    Specialty:  Cardiology   Contact information:   [address] [place] [address] [phone]         Duration of Discharge Encounter: Greater than 30 minutes including physician and PA time.  Signed, Ronie Spies PA-C 07/19/2013, 11:14 AM Refer also to my complete progress note from today. I made the decision for the patient to go home. I agree with the complete note above.  Jerral Bonito, MD

## 2013-07-19 NOTE — Progress Notes (Signed)
Pt discharged per MD order and protocol. Discharge instructions reviewed with patient and all questions answered. Pt aware of all follow up appointments and given prescription.

## 2013-07-19 NOTE — Progress Notes (Signed)
Patient ID: Marie Johnson, female   DOB: 1950-09-27, 62 y.o.   MRN: [id number]    SUBJECTIVE:   Filed Vitals:   07/18/13 1326 07/18/13 1950 07/19/13 0410 07/19/13 0615  BP: 119/50 123/67 165/77   Pulse: 67 58 68   Temp: 98.4 F (36.9 C) 98.4 F (36.9 C) 98.2 F (36.8 C)   TempSrc: Oral Oral Oral   Resp: 17 17 18    Height:      Weight:    143 lb 12.8 oz (65.227 kg)  SpO2: 100% 97% 95%      Intake/Output Summary (Last 24 hours) at 07/19/13 0904 Last data filed at 07/18/13 1100  Gross per 24 hour  Intake     12 ml  Output      0 ml  Net     12 ml    LABS: Basic Metabolic Panel:  Recent Labs  30/86/57 1000 07/17/13 2000 07/18/13 0417  NA 133*  --  138  K 3.4*  --  4.7  CL 95*  --  104  CO2 23  --  25  GLUCOSE 105*  --  93  BUN 13  --  9  CREATININE 0.68 0.77 0.74  CALCIUM 9.7  --  9.2   Liver Function Tests:  Recent Labs  07/17/13 1000  AST 16  ALT 10  ALKPHOS 59  BILITOT 0.5  PROT 7.8  ALBUMIN 4.0    Recent Labs  07/17/13 2000  LIPASE 49   CBC:  Recent Labs  07/17/13 1000 07/17/13 2000 07/18/13 0417  WBC 6.0 6.1 4.9  NEUTROABS 3.4  --   --   HGB 14.8 13.3 13.8  HCT 40.7 37.7 39.3  MCV 99.3 100.0 100.5*  PLT 232 214 217   Cardiac Enzymes:  Recent Labs  07/17/13 1000 07/17/13 2000 07/17/13 2208  TROPONINI <0.30 <0.30 <0.30   BNP: No components found with this basename: POCBNP,  D-Dimer:  Recent Labs  07/17/13 1000  DDIMER 0.75*   Hemoglobin A1C:  Recent Labs  07/17/13 2000  HGBA1C 6.1*   Fasting Lipid Panel: No results found for this basename: CHOL, HDL, LDLCALC, TRIG, CHOLHDL, LDLDIRECT,  in the last 72 hours Thyroid Function Tests:  Recent Labs  07/17/13 2000  TSH 1.009    RADIOLOGY: Dg Chest 2 View  07/17/2013   CLINICAL DATA:  Chest pain  EXAM: CHEST  2 VIEW  COMPARISON:  06/22/2010  FINDINGS: Chronic interstitial markings. No focal consolidation. Calcified granuloma at the left lung base. Chronic  elevation of the left hemidiaphragm with mild left basilar scarring. No pleural effusion or pneumothorax.  The heart is top-normal in size.  Degenerative changes of the visualized thoracolumbar spine. Mild anterior wedging of a mid thoracic vertebral body, likely chronic.  IMPRESSION: No evidence of acute cardiopulmonary disease.   Electronically Signed   By: Charline Bills M.D.   On: 07/17/2013 10:19   Ct Angio Chest Pe W/cm &/or Wo Cm  07/17/2013   CLINICAL DATA:  Midsternal chest pain shortness of breath, cough  EXAM: CT ANGIOGRAPHY CHEST WITH CONTRAST  TECHNIQUE: Multidetector CT imaging of the chest was performed using the standard protocol during bolus administration of intravenous contrast. Multiplanar CT image reconstructions including MIPs were obtained to evaluate the vascular anatomy.  CONTRAST:  80mL OMNIPAQUE IOHEXOL 350 MG/ML SOLN  COMPARISON:  Chest radiographs dated 07/17/2013  FINDINGS: No evidence of pulmonary embolism.  Mild centrilobular emphysematous changes. Calcified left lower lobe granulomata. No suspicious pulmonary nodules.  No pleural effusion or pneumothorax.  Visualized thyroid is unremarkable.  Cardiomegaly. No pericardial effusion. Coronary atherosclerosis. Atherosclerotic calcifications of the aortic arch.  Calcified mediastinal nodes. No suspicious mediastinal, hilar, or axillary lymphadenopathy.  Visualized upper abdomen is notable for splenic granulomata.  Degenerative changes of the visualized thoracolumbar spine.  Review of the MIP images confirms the above findings.  IMPRESSION: No evidence of pulmonary embolism.  Mild emphysematous changes.  Sequela of prior granulomatous disease.   Electronically Signed   By: Charline Bills M.D.   On: 07/17/2013 12:00    PHYSICAL EXAM    Patient is stable. She is oriented to person time and place. Affect is normal. Lungs reveal scattered rhonchi. Cardiac exam reveals S1 and S2. There is no significant peripheral edema. 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   TELEMETRY: I have reviewed telemetry today July 19, 2013. There is normal sinus rhythm.  ASSESSMENT AND PLAN:    CAD (coronary artery disease)     Cardiac catheterization has been done.. Plan is for medical therapy.    Tobacco abuse    It is documented that we have been trying to educate regarding smoking cessation    Hypertension     Meds have been adjusted and blood pressure is now under better control.  She has significant venous and arterial peripheral vascular disease. It appears that she has been evaluated in different places. This is stable at this time.  Patient is ready for discharge today.  Willa Rough 07/19/2013 9:04 AM

## 2013-07-21 ENCOUNTER — Telehealth: Payer: Self-pay | Admitting: Cardiology

## 2013-07-21 NOTE — Telephone Encounter (Signed)
New Problem:  Pt states she just had a heart cath and was released.. Pt states her instructions told her to call with any abnormal blood pressures.. Pt states yesterday her BP was 174/100, 153/76, 166/74.. Today her BP was 120/53, 109/52, 134/66... Heart rate has been 57, 52 and 50... Pt states her heart rate has not been above 60.[name] Please advise

## 2013-07-21 NOTE — Telephone Encounter (Signed)
Follow Up:  Along with the previous message that patient was asking if she should wait to see Dr. Delton See on 11/24 or should she come sooner? Please advise

## 2013-07-21 NOTE — Telephone Encounter (Signed)
Hi,  There is no need to come earlier than 08/10/13. It would be helpful if she could check her BP and HR once a day until the next visit.  Thank you,  Tobias Alexander

## 2013-07-21 NOTE — Telephone Encounter (Signed)
SPOKE  WITH  PT   RE MESSAGE  INFORMED PT   B/P AND HEART  RATE READINGS  ARE OKAY TO  CONTINUE  TO MONITOR  AND  IF  NOTES  CONSISTENTLY  LOW  OR  HIGH  B/P  OR  HEART  RATES  TO CALL OFFICE   PT  VERBALIZED UNDERSTANDING  HAS APPT ON 08-04-13  AT  3:00 PM  WILL FORWARD TO   DR  NELSON./CY

## 2013-07-24 ENCOUNTER — Encounter (INDEPENDENT_AMBULATORY_CARE_PROVIDER_SITE_OTHER): Payer: Self-pay

## 2013-08-03 ENCOUNTER — Encounter: Payer: Medicare Other | Admitting: Cardiology

## 2013-08-03 NOTE — Progress Notes (Signed)
Patient ID: Marie Johnson, female   DOB: 19-Jun-1951, 62 y.o.   MRN: [id number]  Patient Name: Marie Johnson Date of Encounter: 08/03/2013  Primary Care Provider:  Ailene Ravel, MD Primary Cardiologist:  Tobias Alexander, H  Patient Profile  Chest pain  Problem List   Past Medical History  Diagnosis Date  . Arthritis   . Asthma   . GERD (gastroesophageal reflux disease)   . COPD (chronic obstructive pulmonary disease)   . Hyperlipidemia   . Hypertension   . Osteoporosis   . PAD (peripheral artery disease)     a. s/p L fem-pop bypass b. recent evaluation at [facility], unamenable to intervention  . CHD (congenital heart disease)   . CAD (coronary artery disease)     a. Nonobstructive CAD by cath 07/17/13.  [name] PUD (peptic ulcer disease)   . Aortic atherosclerosis   . AAA (abdominal aortic aneurysm)     3.4 cm  . Chest pain     a. Adm 10-07/2013 - CTA neg for PE/dissection, cath with nonobstructive disease, CP ?due to uncontrolled HTN vs coronary vasospasm.  [name] Anxiety   . Tobacco abuse    Past Surgical History  Procedure Laterality Date  . Vein bypass surgery      left leg  . Rotator cuff repair      right side, had torn ligaments as well  . Wrist arthrocentesis      left - broke in 3 places so has bars and screws placed  . Hip surgery      left - bars and screws placed  . Abdominal hysterectomy    . Tubal ligation    . Cardiac catheterization      several years ago, nonobstructive, "50-60% blockages"   Allergies  Allergies  Allergen Reactions  . Bee Venom Anaphylaxis   HPI  62 year old female, older appearing, lifelong smoker with prior medical history of hypertension, PAD who was recently evaluated for an abdominal hernia. There was an incidental finding of significant coronary calcifications on the CT and she was referred to [place] for further evaluation.  The patient states that she has exertional dyspnea and chest tightness with minimal exertion. As an  example she uses this walking one flight of stairs when she has to stop and it resolves within a few minutes. She denies any resting chest pain. Denies any palpitations or syncope. The pain feels pressure-like retrosternal with no radiation. She did have a cardiac cath by [employer] many years ago with findings of non-obstructive coronary artery disease at that time.  There was 3.4 cm abdominal aortic aneurysm on her CT that is known for many years. The patient is a lifelong smoker, currently smoking one and one half packs per day. She has a h/o left femoropopliteal bypass by Dr. Arbie Cookey 15 years ago. She complains of bilateral claudications left worse than right that start after walking minimal distances. She underwent arterial Doppler ultrasound bilaterally and lower extremity angiogram at [facility]. She was talled that she had severe diffuse peripheral arterial disease, however her lesions are distal and her vessels are small for interventions.  Home Medications  Prior to Admission medications   Medication Sig Start Date End Date Taking? Authorizing Provider  atenolol (TENORMIN) 50 MG tablet 50 mg 2 (two) times daily.  06/09/13  Yes Historical Provider, MD  furosemide (LASIX) 40 MG tablet  06/09/13  Yes Historical Provider, MD  LORazepam (ATIVAN) 1 MG tablet  04/22/13  Yes Historical Provider,  MD  losartan (COZAAR) 25 MG tablet  05/26/13  Yes Historical Provider, MD  NITROSTAT 0.4 MG SL tablet  06/24/13  Yes Historical Provider, MD  Oxycodone HCl 10 MG TABS  06/08/13  Yes Historical Provider, MD  pantoprazole (PROTONIX) 40 MG tablet  06/09/13  Yes Historical Provider, MD  pravastatin (PRAVACHOL) 40 MG tablet  05/27/13  Yes Historical Provider, MD  PROAIR HFA 108 (90 BASE) MCG/ACT inhaler  06/08/13  Yes Historical Provider, MD  traMADol Janean Sark) 50 MG tablet  06/14/13  Yes Historical Provider, MD   Family History  Family History  Problem Relation Age of Onset  . Cancer Mother     cervica  .  Heart disease Mother     MI in 29s  . Cancer Father     liver  . Heart disease Father   . Cancer Maternal Aunt     lung  . Cancer Maternal Uncle     prostate and anal  . Cancer Maternal Grandmother     breast  . Cancer Maternal Aunt     lung  . Cancer Maternal Aunt     lung  . Cancer Maternal Aunt     stomach  . Cancer Maternal Aunt     colon  . Cancer Maternal Uncle     lung    Social History  History   Social History  . Marital Status: Married    Spouse Name: N/A    Number of Children: N/A  . Years of Education: N/A   Occupational History  . Not on file.   Social History Main Topics  . Smoking status: Current Every Day Smoker -- 1.00 packs/day    Types: Cigarettes  . Smokeless tobacco: Never Used  . Alcohol Use: No  . Drug Use: No  . Sexual Activity: Not on file   Other Topics Concern  . Not on file   Social History Narrative  . No narrative on file     Review of Systems General:  No chills, fever, night sweats or weight changes.  Cardiovascular:  No chest pain, dyspnea on exertion, edema, orthopnea, palpitations, paroxysmal nocturnal dyspnea. Dermatological: No rash, lesions/masses Respiratory: No cough, dyspnea Urologic: No hematuria, dysuria Abdominal:   No nausea, vomiting, diarrhea, bright red blood per rectum, melena, or hematemesis Neurologic:  No visual changes, wkns, changes in mental status. All other systems reviewed and are otherwise negative except as noted above.  Physical Exam  There were no vitals taken for this visit.  General: Older appearang, smelling like cigaretes, NAD Psych: Normal affect. Neuro: Alert and oriented X 3. Moves all extremities spontaneously. HEENT: Normal  Neck: Supple without bruits or JVD. Lungs:  Resp regular and unlabored, CTA. Heart: RRR no s3, s4, or murmurs. Abdomen: Soft, non-tender, non-distended, BS + x 4.  Extremities: No clubbing, cyanosis or edema. Radials 2+ LE cold, no pulses  DP/PT/  bilaterally.  Accessory Clinical Findings  ECG - sinus bradycardia, 56 beats per minute, otherwise normal EKG.  Lipid Panel  No results found for this basename: chol,  trig,  hdl,  cholhdl,  vldl,  ldlcalc    Assessment & Plan  62 year old female lifelong smoker, older appearing, coming for concern of chest pain and coronary artery calcifications on CT.  1. Chest pain - appears typical, patient has multiple risk factors including smoking, hypertension, hyperlipidemia, we will order a Lexiscan a nuclear stress test as the patient wouldn't be able to walk on treadmill because she is limited by  her claudications. Continue aspirin, statin, atenolol, losartan, s.l. nitroglycerin when necessary.  2. Hyperlipidemia - TAG 98, HDL 78, LDL 97, WNL, continue pravachol 40 mg po daily  3. Hypertension - borderline, we will rechcek at the next visit, if still elevated we will adjust BP meds  4. PAD - severe B/L, however not amenable to intervention, the patient was consulted on smoking cessation, she is not interested  5. Smoking cessation - the patient is not interested in any intervention or support group and doesn't seem to be interested in cessation in general  Follow up in 1 month   Tobias Alexander, Rexene Edison, MD 08/03/2013, 8:25 AM

## 2013-08-04 ENCOUNTER — Ambulatory Visit: Payer: Medicare Other | Admitting: Cardiology

## 2013-08-05 ENCOUNTER — Encounter: Payer: Self-pay | Admitting: Cardiology

## 2013-08-05 ENCOUNTER — Ambulatory Visit (INDEPENDENT_AMBULATORY_CARE_PROVIDER_SITE_OTHER): Payer: Medicare Other | Admitting: Cardiology

## 2013-08-05 ENCOUNTER — Ambulatory Visit: Payer: Medicare Other | Admitting: Cardiology

## 2013-08-05 ENCOUNTER — Other Ambulatory Visit (INDEPENDENT_AMBULATORY_CARE_PROVIDER_SITE_OTHER): Payer: Medicare Other

## 2013-08-05 VITALS — BP 124/62 | HR 63 | Ht 69.0 in | Wt 145.0 lb

## 2013-08-05 DIAGNOSIS — I10 Essential (primary) hypertension: Secondary | ICD-10-CM

## 2013-08-05 LAB — BASIC METABOLIC PANEL
CO2: 25 mEq/L (ref 19–32)
Calcium: 9.7 mg/dL (ref 8.4–10.5)
Chloride: 99 mEq/L (ref 96–112)
Creatinine, Ser: 1 mg/dL (ref 0.4–1.2)
GFR: 59.28 mL/min — ABNORMAL LOW (ref >60.00)
Glucose, Bld: 105 mg/dL — ABNORMAL HIGH (ref 70–99)
Potassium: 3.3 mEq/L — ABNORMAL LOW (ref 3.5–5.1)
Sodium: 132 mEq/L — ABNORMAL LOW (ref 135–145)

## 2013-08-05 NOTE — Patient Instructions (Signed)
**Note De-identified Gavyn Zoss Obfuscation** Your physician recommends that you continue on your current medications as directed. Please refer to the Current Medication list given to you today.  Your physician wants you to follow-up in: 6 months. You will receive a reminder letter in the mail two months in advance. If you don't receive a letter, please call our office to schedule the follow-up appointment.  

## 2013-08-05 NOTE — Addendum Note (Signed)
Addended by: Demetrios Loll on: 08/05/2013 11:02 AM   Modules accepted: Orders

## 2013-08-05 NOTE — Progress Notes (Signed)
Patient ID: Marie Johnson, female   DOB: Sep 14, 1951, 62 y.o.   MRN: [id number]  Patient Name: Marie Johnson Date of Encounter: 08/05/2013  Primary Care Provider:  Ailene Ravel, MD Primary Cardiologist:  Tobias Alexander, H  Patient Profile  Chest pain  Problem List   Past Medical History  Diagnosis Date  . Arthritis   . Asthma   . GERD (gastroesophageal reflux disease)   . COPD (chronic obstructive pulmonary disease)   . Hyperlipidemia   . Hypertension   . Osteoporosis   . PAD (peripheral artery disease)     a. s/p L fem-pop bypass b. recent evaluation at [facility], unamenable to intervention  . CHD (congenital heart disease)   . CAD (coronary artery disease)     a. Nonobstructive CAD by cath 07/17/13.  [name] PUD (peptic ulcer disease)   . Aortic atherosclerosis   . AAA (abdominal aortic aneurysm)     3.4 cm  . Chest pain     a. Adm 10-07/2013 - CTA neg for PE/dissection, cath with nonobstructive disease, CP ?due to uncontrolled HTN vs coronary vasospasm.  [name] Anxiety   . Tobacco abuse    Past Surgical History  Procedure Laterality Date  . Vein bypass surgery      left leg  . Rotator cuff repair      right side, had torn ligaments as well  . Wrist arthrocentesis      left - broke in 3 places so has bars and screws placed  . Hip surgery      left - bars and screws placed  . Abdominal hysterectomy    . Tubal ligation    . Cardiac catheterization      several years ago, nonobstructive, "50-60% blockages"   Allergies  Allergies  Allergen Reactions  . Bee Venom Anaphylaxis   HPI  62 year old female, older appearing, lifelong smoker with prior medical history of hypertension, PAD who was recently evaluated for an abdominal hernia. There was an incidental finding of significant coronary calcifications on the CT and she was referred to [place] for further evaluation.  The patient states that she has exertional dyspnea and chest tightness with minimal exertion. As an  example she uses this walking one flight of stairs when she has to stop and it resolves within a few minutes. She denies any resting chest pain. Denies any palpitations or syncope. The pain feels pressure-like retrosternal with no radiation. She did have a cardiac cath by [employer] many years ago with findings of non-obstructive coronary artery disease at that time.  There was 3.4 cm abdominal aortic aneurysm on her CT that is known for many years. The patient is a lifelong smoker, currently smoking one and one half packs per day. She has a h/o left femoropopliteal bypass by Dr. Arbie Cookey 15 years ago. She complains of bilateral claudications left worse than right that start after walking minimal distances. She underwent arterial Doppler ultrasound bilaterally and lower extremity angiogram at [facility]. She was talled that she had severe diffuse peripheral arterial disease, however her lesions are distal and her vessels are small for interventions.  The patient had a negative stress test on 07/16/2013, however continuous chest pain. Therefore she underwent a cardiac catheterization on 10/31/ wirth findings of non-obstructive CAD. The patient was started on amlodipine for HTN and possible vasospasm. Today she is coming and reports significant improvement of symptoms. No chest pain and improved SOB. She has cut down on smoking cigarettes.  Home  Medications  Prior to Admission medications   Medication Sig Start Date End Date Taking? Authorizing Provider  atenolol (TENORMIN) 50 MG tablet 50 mg 2 (two) times daily.  06/09/13  Yes Historical Provider, MD  furosemide (LASIX) 40 MG tablet  06/09/13  Yes Historical Provider, MD  LORazepam (ATIVAN) 1 MG tablet  04/22/13  Yes Historical Provider, MD  losartan (COZAAR) 25 MG tablet  05/26/13  Yes Historical Provider, MD  NITROSTAT 0.4 MG SL tablet  06/24/13  Yes Historical Provider, MD  Oxycodone HCl 10 MG TABS  06/08/13  Yes Historical Provider, MD  pantoprazole  (PROTONIX) 40 MG tablet  06/09/13  Yes Historical Provider, MD  pravastatin (PRAVACHOL) 40 MG tablet  05/27/13  Yes Historical Provider, MD  PROAIR HFA 108 (90 BASE) MCG/ACT inhaler  06/08/13  Yes Historical Provider, MD  traMADol Janean Sark) 50 MG tablet  06/14/13  Yes Historical Provider, MD   Family History  Family History  Problem Relation Age of Onset  . Cancer Mother     cervica  . Heart disease Mother     MI in 32s  . Cancer Father     liver  . Heart disease Father   . Cancer Maternal Aunt     lung  . Cancer Maternal Uncle     prostate and anal  . Cancer Maternal Grandmother     breast  . Cancer Maternal Aunt     lung  . Cancer Maternal Aunt     lung  . Cancer Maternal Aunt     stomach  . Cancer Maternal Aunt     colon  . Cancer Maternal Uncle     lung    Social History  History   Social History  . Marital Status: Married    Spouse Name: N/A    Number of Children: N/A  . Years of Education: N/A   Occupational History  . Not on file.   Social History Main Topics  . Smoking status: Current Every Day Smoker -- 1.00 packs/day    Types: Cigarettes  . Smokeless tobacco: Never Used  . Alcohol Use: No  . Drug Use: No  . Sexual Activity: Not on file   Other Topics Concern  . Not on file   Social History Narrative  . No narrative on file     Review of Systems General:  No chills, fever, night sweats or weight changes.  Cardiovascular:  No chest pain, dyspnea on exertion, edema, orthopnea, palpitations, paroxysmal nocturnal dyspnea. Dermatological: No rash, lesions/masses Respiratory: No cough, dyspnea Urologic: No hematuria, dysuria Abdominal:   No nausea, vomiting, diarrhea, bright red blood per rectum, melena, or hematemesis Neurologic:  No visual changes, wkns, changes in mental status. All other systems reviewed and are otherwise negative except as noted above.  Physical Exam  Blood pressure 124/62, pulse 63, height 5\' 9"  (1.753 m), weight 145 lb  (65.772 kg).  General: Older appearang, smelling like cigaretes, NAD Psych: Normal affect. Neuro: Alert and oriented X 3. Moves all extremities spontaneously. HEENT: Normal  Neck: Supple without bruits or JVD. Lungs:  Resp regular and unlabored, CTA. Heart: RRR no s3, s4, or murmurs. Abdomen: Soft, non-tender, non-distended, BS + x 4.  Extremities: No clubbing, cyanosis or edema. Radials 2+ LE cold, no pulses  DP/PT/ bilaterally.  Accessory Clinical Findings  ECG - sinus bradycardia, 56 beats per minute, otherwise normal EKG.  Left heart cath: 07/17/13 Hemodynamics:  AO 150/65  LV 159/17  Coronary angiography:  Coronary dominance:  right  Left mainstem: Very mild distal LM tapering.  Left anterior descending (LAD): Heavy proximal LAD calcification. 30-40% stenosis in the proximal LAD at the take-off of a small 2nd diagonal.  Left circumflex (LCx): Medium-sized ramus without signficant disease. AV LCx with 30-40% ostial stenosis. OM1 with 30% ostial stenosis.  Right coronary artery (RCA): There was prominent proximal to mid RCA calcification. 30% proximal, 30% mid RCA stenosis.  Left ventriculography: Not done to conserve contrast. Normal EF on recent stress Cardiolite.  Final Conclusions: Nonobstructive CAD. I do not have an explanation for her chest pain. No PE or dissection on chest CT. I will keep her overnight to control her blood pressure. Start amlodipine for BP control and in case this could be coronary vasospasm. Will keep on NTG gtt for now while BP is still quite high.  Marca Ancona  07/17/2013, 4:19 PM   Assessment & Plan  62 year old female lifelong smoker, older appearing, coming for concern of chest pain and coronary artery calcifications on CT.  1. Chest pain - cardiac cath showed non-obstructive CAD, started on amlodipine for HTN and possible vasospasm, complete resolution of symptoms. Continue aspirin, statin, atenolol, losartan, decreasing the amount of  cigarettes  2. Hyperlipidemia - TAG 98, HDL 78, LDL 97, WNL, continue pravachol 40 mg po daily  3. Hypertension - controlled, continue the same regimen  4. PAD - severe B/L, however not amenable to intervention, the patient was consulted on smoking cessation, she is not interested  5. Smoking cessation -working on it, still smoking, discussed again  Follow up in 6 month   Tobias Alexander, Rexene Edison, MD 08/05/2013, 8:59 AM

## 2013-08-06 ENCOUNTER — Other Ambulatory Visit: Payer: Self-pay | Admitting: *Deleted

## 2013-08-06 DIAGNOSIS — Z79899 Other long term (current) drug therapy: Secondary | ICD-10-CM

## 2013-08-06 DIAGNOSIS — E876 Hypokalemia: Secondary | ICD-10-CM

## 2013-08-06 MED ORDER — POTASSIUM CHLORIDE ER 10 MEQ PO TBCR: 10.0000 meq | EXTENDED_RELEASE_TABLET | Freq: Every day | ORAL | Status: DC

## 2013-08-10 ENCOUNTER — Ambulatory Visit: Payer: Medicare Other | Admitting: Cardiology

## 2014-01-25 ENCOUNTER — Encounter: Payer: Self-pay | Admitting: *Deleted

## 2014-02-01 ENCOUNTER — Ambulatory Visit (INDEPENDENT_AMBULATORY_CARE_PROVIDER_SITE_OTHER): Payer: Medicare Other | Admitting: Cardiology

## 2014-02-01 VITALS — BP 100/46 | HR 73 | Ht 69.0 in | Wt 153.1 lb

## 2014-02-01 DIAGNOSIS — E785 Hyperlipidemia, unspecified: Secondary | ICD-10-CM

## 2014-02-01 DIAGNOSIS — I714 Abdominal aortic aneurysm, without rupture, unspecified: Secondary | ICD-10-CM

## 2014-02-01 DIAGNOSIS — I10 Essential (primary) hypertension: Secondary | ICD-10-CM

## 2014-02-01 DIAGNOSIS — I2510 Atherosclerotic heart disease of native coronary artery without angina pectoris: Secondary | ICD-10-CM

## 2014-02-01 DIAGNOSIS — J449 Chronic obstructive pulmonary disease, unspecified: Secondary | ICD-10-CM

## 2014-02-01 DIAGNOSIS — I739 Peripheral vascular disease, unspecified: Secondary | ICD-10-CM

## 2014-02-01 DIAGNOSIS — I700 Atherosclerosis of aorta: Secondary | ICD-10-CM

## 2014-02-01 LAB — COMPREHENSIVE METABOLIC PANEL
ALT: 14 U/L (ref 0–35)
AST: 16 U/L (ref 0–37)
Albumin: 4 g/dL (ref 3.5–5.2)
Alkaline Phosphatase: 54 U/L (ref 39–117)
BUN: 28 mg/dL — ABNORMAL HIGH (ref 6–23)
CO2: 26 mEq/L (ref 19–32)
Calcium: 9.5 mg/dL (ref 8.4–10.5)
Chloride: 100 mEq/L (ref 96–112)
Creatinine, Ser: 1.4 mg/dL — ABNORMAL HIGH (ref 0.4–1.2)
GFR: 40.48 mL/min — ABNORMAL LOW (ref >60.00)
Glucose, Bld: 106 mg/dL — ABNORMAL HIGH (ref 70–99)
Potassium: 3.6 mEq/L (ref 3.5–5.1)
Sodium: 135 mEq/L (ref 135–145)
Total Bilirubin: 0.7 mg/dL (ref 0.2–1.2)
Total Protein: 7.2 g/dL (ref 6.0–8.3)

## 2014-02-01 LAB — BRAIN NATRIURETIC PEPTIDE: Pro B Natriuretic peptide (BNP): 96 pg/mL (ref 0.0–100.0)

## 2014-02-01 MED ORDER — FUROSEMIDE 40 MG PO TABS: 40.0000 mg | ORAL_TABLET | Freq: Two times a day (BID) | ORAL | Status: DC

## 2014-02-01 MED ORDER — POTASSIUM CHLORIDE CRYS ER 20 MEQ PO TBCR: 20.0000 meq | EXTENDED_RELEASE_TABLET | Freq: Every day | ORAL | Status: DC

## 2014-02-01 NOTE — Progress Notes (Signed)
Patient ID: Marie Johnson, female   DOB: 1950/10/13, 63 y.o.   MRN: [id number]    Patient Name: Marie Johnson Date of Encounter: 02/01/2014  Primary Care Provider:  Leonides Sake, MD Primary Cardiologist:  Dorothy Spark  Patient Profile  Chest pain  Problem List   Past Medical History  Diagnosis Date  . Arthritis   . Asthma   . GERD (gastroesophageal reflux disease)   . COPD (chronic obstructive pulmonary disease)   . Hyperlipidemia   . Hypertension   . Osteoporosis   . PAD (peripheral artery disease)     a. s/p L fem-pop bypass b. recent evaluation at [facility], unamenable to intervention  . CHD (congenital heart disease)   . CAD (coronary artery disease)     a. Nonobstructive CAD by cath 07/17/13.  [name] PUD (peptic ulcer disease)   . Aortic atherosclerosis   . AAA (abdominal aortic aneurysm)     3.4 cm  . Chest pain     a. Adm 10-07/2013 - CTA neg for PE/dissection, cath with nonobstructive disease, CP ?due to uncontrolled HTN vs coronary vasospasm.  [name] Anxiety   . Tobacco abuse    Past Surgical History  Procedure Laterality Date  . Vein bypass surgery      left leg  . Rotator cuff repair      right side, had torn ligaments as well  . Wrist arthrocentesis      left - broke in 3 places so has bars and screws placed  . Hip surgery      left - bars and screws placed  . Abdominal hysterectomy    . Tubal ligation    . Cardiac catheterization      several years ago, nonobstructive, "50-60% blockages"   Allergies  Allergies  Allergen Reactions  . Bee Venom Anaphylaxis   HPI  63 year old female, older appearing, lifelong smoker with prior medical history of hypertension, PAD who was recently evaluated for an abdominal hernia. There was an incidental finding of significant coronary calcifications on the CT and she was referred to [place] for further evaluation.  The patient states that she has exertional dyspnea and chest tightness with minimal exertion. As an example  she uses this walking one flight of stairs when she has to stop and it resolves within a few minutes. She denies any resting chest pain. Denies any palpitations or syncope. The pain feels pressure-like retrosternal with no radiation. She did have a cardiac cath by Odis Hollingshead many years ago with findings of non-obstructive coronary artery disease at that time.  There was 3.4 cm abdominal aortic aneurysm on her CT that is known for many years. The patient is a lifelong smoker, currently smoking one and one half packs per day. She has a h/o left femoropopliteal bypass by Dr. Donnetta Hutching 15 years ago. She complains of bilateral claudications left worse than right that start after walking minimal distances. She underwent arterial Doppler ultrasound bilaterally and lower extremity angiogram at [facility]. She was talled that she had severe diffuse peripheral arterial disease, however her lesions are distal and her vessels are small for interventions.  The patient had a negative stress test on 07/16/2013, however continuous chest pain. Therefore she underwent a cardiac catheterization on 10/31/ wirth findings of non-obstructive CAD. The patient was started on amlodipine for HTN and possible vasospasm. Today she is coming and reports significant improvement of symptoms. No chest pain and improved SOB. She has cut down on smoking cigarettes.  02/01/2014 -  the patient is coming after her 6 months. She states that she has been doing well until for about last week when she started to develop lower extremity edema. She also noticed worsening dyspnea on exertion and also occasional paroxysmal nocturnal dyspnea. Inhalers given her some relief only minimal. She continues to smoke heavily. She denies any chest pain. She also states that her blood pressure has been running low and she has felt dizzy and weak.  Home Medications  Prior to Admission medications   Medication Sig Start Date End Date Taking? Authorizing  Provider  atenolol (TENORMIN) 50 MG tablet 50 mg 2 (two) times daily.  06/09/13  Yes Historical Provider, MD  furosemide (LASIX) 40 MG tablet  06/09/13  Yes Historical Provider, MD  LORazepam (ATIVAN) 1 MG tablet  04/22/13  Yes Historical Provider, MD  losartan (COZAAR) 25 MG tablet  05/26/13  Yes Historical Provider, MD  NITROSTAT 0.4 MG SL tablet  06/24/13  Yes Historical Provider, MD  Oxycodone HCl 10 MG TABS  06/08/13  Yes Historical Provider, MD  pantoprazole (PROTONIX) 40 MG tablet  06/09/13  Yes Historical Provider, MD  pravastatin (PRAVACHOL) 40 MG tablet  05/27/13  Yes Historical Provider, MD  PROAIR HFA 108 (90 BASE) MCG/ACT inhaler  06/08/13  Yes Historical Provider, MD  traMADol Veatrice Bourbon) 50 MG tablet  06/14/13  Yes Historical Provider, MD   Family History  Family History  Problem Relation Age of Onset  . Cervical cancer Mother   . Heart disease Mother     MI in 72s  . Liver cancer Father   . Heart disease Father   . Lung cancer Maternal Aunt   . Prostate cancer Maternal Uncle     met. anal  . Breast cancer Maternal Grandmother   . Lung cancer Maternal Aunt   . Lung cancer Maternal Aunt   . Stomach cancer Maternal Aunt   . Colon cancer Maternal Aunt   . Lung cancer Maternal Uncle     Social History  History   Social History  . Marital Status: Married    Spouse Name: N/A    Number of Children: N/A  . Years of Education: N/A   Occupational History  . Not on file.   Social History Main Topics  . Smoking status: Current Every Day Smoker -- 1.00 packs/day    Types: Cigarettes  . Smokeless tobacco: Never Used  . Alcohol Use: No  . Drug Use: No  . Sexual Activity: Not on file   Other Topics Concern  . Not on file   Social History Narrative  . No narrative on file     Review of Systems General:  No chills, fever, night sweats or weight changes.  Cardiovascular:  No chest pain, dyspnea on exertion, edema, orthopnea, palpitations, paroxysmal nocturnal  dyspnea. Dermatological: No rash, lesions/masses Respiratory: No cough, dyspnea Urologic: No hematuria, dysuria Abdominal:   No nausea, vomiting, diarrhea, bright red blood per rectum, melena, or hematemesis Neurologic:  No visual changes, wkns, changes in mental status. All other systems reviewed and are otherwise negative except as noted above.  Physical Exam  Blood pressure 100/46, pulse 73, height 5' 9"  (1.753 m), weight 153 lb 1.9 oz (69.455 kg), SpO2 95.00%.  General: Older appearang, smelling like cigaretes, NAD Psych: Normal affect. Neuro: Alert and oriented X 3. Moves all extremities spontaneously. HEENT: Normal  Neck: Supple without bruits or JVD. Lungs:  Resp regular and unlabored, crackles B/L Heart: RRR no s3, s4, or murmurs. Abdomen:  Soft, non-tender, non-distended, BS + x 4.  Extremities: No clubbing, cyanosis, B/L LE perimalleolar edema. Radials 2+ LE cold, no pulses  DP/PT/ bilaterally.  Accessory Clinical Findings  ECG - sinus bradycardia, 56 beats per minute, otherwise normal EKG.  Left heart cath: 07/17/13 Hemodynamics:  AO 150/65  LV 159/17  Coronary angiography:  Coronary dominance: right  Left mainstem: Very mild distal LM tapering.  Left anterior descending (LAD): Heavy proximal LAD calcification. 30-40% stenosis in the proximal LAD at the take-off of a small 2nd diagonal.  Left circumflex (LCx): Medium-sized ramus without signficant disease. AV LCx with 30-40% ostial stenosis. OM1 with 30% ostial stenosis.  Right coronary artery (RCA): There was prominent proximal to mid RCA calcification. 30% proximal, 30% mid RCA stenosis.  Left ventriculography: Not done to conserve contrast. Normal EF on recent stress Cardiolite.  Final Conclusions: Nonobstructive CAD. I do not have an explanation for her chest pain. No PE or dissection on chest CT. I will keep her overnight to control her blood pressure. Start amlodipine for BP control and in case this could be  coronary vasospasm. Will keep on NTG gtt for now while BP is still quite high.  Loralie Champagne  07/17/2013, 4:19 PM     Assessment & Plan  63 year old female lifelong smoker, older appearing, coming for concern of chest pain and coronary artery calcifications on CT.  1. acute CHF with preserved left ventricular ejection fraction - we'll increase Lasix to 40 mg orally twice a day and increase potassium chloride to 20 mEq daily.  2. Hypotension with Dizziness - discontinue bisoprolol, we will followup in 2 weeks.  3. Chest pain - cardiac cath showed non-obstructive CAD, started on amlodipine for HTN and possible vasospasm, complete resolution of symptoms. Continue aspirin, statin, atenolol, losartan, decreasing the amount of cigarettes  4. Hyperlipidemia - TAG 98, HDL 78, LDL 97, WNL, continue pravachol 40 mg po daily  5. PAD - severe B/L, however not amenable to intervention, the patient was consulted on smoking cessation, she is not interested  6. Smoking cessation -working on it, still smoking, discussed again  Check comprehensive metabolic profile and BNP today, followup in 2-3 weeks with repeat BMP.   Dorothy Spark, MD 02/01/2014, 3:20 PM

## 2014-02-01 NOTE — Patient Instructions (Addendum)
STOP TAKING BISOPROLOL NOW  INCREASE YOUR LASIX TO 40 MG TWICE DAILY  INCREASE YOUR POTASSIUM CHLORIDE TO 20 mEq ONCE DAILY  Your physician recommends that you return for lab work in: TODAY (BNP AND CMET)  Your physician recommends that you schedule a follow-up appointment in: [place]

## 2014-02-02 ENCOUNTER — Telehealth: Payer: Self-pay | Admitting: *Deleted

## 2014-02-02 ENCOUNTER — Telehealth: Payer: Self-pay | Admitting: Cardiology

## 2014-02-02 DIAGNOSIS — I700 Atherosclerosis of aorta: Secondary | ICD-10-CM

## 2014-02-02 DIAGNOSIS — I714 Abdominal aortic aneurysm, without rupture, unspecified: Secondary | ICD-10-CM

## 2014-02-02 DIAGNOSIS — I739 Peripheral vascular disease, unspecified: Secondary | ICD-10-CM

## 2014-02-02 DIAGNOSIS — E785 Hyperlipidemia, unspecified: Secondary | ICD-10-CM

## 2014-02-02 DIAGNOSIS — I2510 Atherosclerotic heart disease of native coronary artery without angina pectoris: Secondary | ICD-10-CM

## 2014-02-02 DIAGNOSIS — I10 Essential (primary) hypertension: Secondary | ICD-10-CM

## 2014-02-02 NOTE — Telephone Encounter (Signed)
Pt contacted about taking her Allopurinol with her other medications is completely safe per Dr Meda Coffee.  Pt verbalized understanding and pleased with the call back.

## 2014-02-02 NOTE — Telephone Encounter (Signed)
Pt calling to inform nurse that she forgot to tell us yesterday that she has been started on a new med by her PCP for gout.  Pt started taking Allopurinol 100 mg po daily for gout.  Pt wanted [place] to update this on her current med list, and also wanted to make sure this does not interact with any other meds she is taking.  Told pt I will notify Dr Meda Coffee of this and ask her if its safe to take with the other meds.  Pt verbalized understanding and pleased with the call back.

## 2014-02-02 NOTE — Telephone Encounter (Signed)
Pt contacted about lab results and Dr Francesca Oman recommendation for pt to start taking KCL 20 mEq po daily and recheck BMP in one week.  Pt was started on KCL 20 mEq yesterday at [place]. Pt states she is currently taking this dose as of today.  Scheduled pt a lab appointment for next Tuesday 5/26 for a repeat BMP.  Pt verbalized understanding of all orders endorsed, and agrees with this treatment plan.

## 2014-02-02 NOTE — Telephone Encounter (Signed)
Message copied by Nuala Alpha on Tue Feb 02, 2014  8:33 AM ------      Message from: Dorothy Spark      Created: Mon Feb 01, 2014 11:50 PM       She needs to be started on KCl 20 mEq daily and come back for a BMP in 1 week,      Could you call her?      Thank you,      K ------

## 2014-02-02 NOTE — Telephone Encounter (Signed)
New problem   Pt need to speak back to you concerning her lab results.

## 2014-02-02 NOTE — Telephone Encounter (Signed)
Please let her know that it is safe to take Allopurinol.

## 2014-02-03 ENCOUNTER — Telehealth: Payer: Self-pay | Admitting: Cardiology

## 2014-02-03 NOTE — Telephone Encounter (Signed)
Spoke with patient and she states every time she stands she gets dizzy. Reviewed medications with patient and she has taken her morning dose. Will forward to Tera Helper NP for review

## 2014-02-03 NOTE — Telephone Encounter (Signed)
Hold the Norvasc.  Monitor BP  Should have follow up with Dr. Meda Coffee per her note.

## 2014-02-03 NOTE — Telephone Encounter (Signed)
Advised patient, verbalized understanding. Patient will call back if remains low or starts going high. Did confirm appointments with patient.

## 2014-02-03 NOTE — Telephone Encounter (Signed)
New message    No chest pain , no sob.    Blood pressure today 81/52 - standing about  30 min ago.    Sitting 104/57 - right after . Started getting dizzy .

## 2014-02-09 ENCOUNTER — Other Ambulatory Visit (INDEPENDENT_AMBULATORY_CARE_PROVIDER_SITE_OTHER): Payer: Medicare Other

## 2014-02-09 ENCOUNTER — Telehealth: Payer: Self-pay | Admitting: Cardiology

## 2014-02-09 DIAGNOSIS — I739 Peripheral vascular disease, unspecified: Secondary | ICD-10-CM

## 2014-02-09 DIAGNOSIS — I714 Abdominal aortic aneurysm, without rupture, unspecified: Secondary | ICD-10-CM

## 2014-02-09 DIAGNOSIS — I2510 Atherosclerotic heart disease of native coronary artery without angina pectoris: Secondary | ICD-10-CM

## 2014-02-09 DIAGNOSIS — E785 Hyperlipidemia, unspecified: Secondary | ICD-10-CM

## 2014-02-09 DIAGNOSIS — I10 Essential (primary) hypertension: Secondary | ICD-10-CM

## 2014-02-09 DIAGNOSIS — I700 Atherosclerosis of aorta: Secondary | ICD-10-CM

## 2014-02-09 LAB — BASIC METABOLIC PANEL
BUN: 31 mg/dL — ABNORMAL HIGH (ref 6–23)
CO2: 26 mEq/L (ref 19–32)
Calcium: 9.3 mg/dL (ref 8.4–10.5)
Chloride: 99 mEq/L (ref 96–112)
Creatinine, Ser: 1 mg/dL (ref 0.4–1.2)
GFR: 56.57 mL/min — ABNORMAL LOW (ref >60.00)
Glucose, Bld: 72 mg/dL (ref 70–99)
Potassium: 4 mEq/L (ref 3.5–5.1)
Sodium: 134 mEq/L — ABNORMAL LOW (ref 135–145)

## 2014-02-09 NOTE — Telephone Encounter (Signed)
Pt contacted about lab results from 5/26.  Per Dr Meda Coffee this pts labs were normal and she should continue taking her diuretics and KCL as prescribed.  Pt verbalized understanding and agrees with this treatment plan.

## 2014-02-09 NOTE — Telephone Encounter (Signed)
Spoke with Husband.  Pt left to go to store and will call back to our office when she returns.

## 2014-02-09 NOTE — Telephone Encounter (Signed)
Patient is returning your call, please call her back @ [phone].

## 2014-02-12 ENCOUNTER — Other Ambulatory Visit (HOSPITAL_COMMUNITY): Payer: Self-pay | Admitting: Physician Assistant

## 2014-02-12 NOTE — Telephone Encounter (Signed)
Is the patient still supposed to be on this? Please advise. Thanks, MI

## 2014-02-15 ENCOUNTER — Telehealth: Payer: Self-pay | Admitting: Cardiology

## 2014-02-15 NOTE — Telephone Encounter (Signed)
Advised patient, will call back if no improvement.

## 2014-02-15 NOTE — Telephone Encounter (Signed)
New message     80/50 standing up bp-----110/60 sitting down bp-----pt is having dizziness and lightheadedness-----pt want to talk to a nurse

## 2014-02-15 NOTE — Telephone Encounter (Signed)
She should stop losartan and cut lasix to 20 mg po daily. She is scheduled to see me the next week.  Would you call her. Thank you, K

## 2014-02-15 NOTE — Telephone Encounter (Signed)
I spoke with the pt and she had multiples spells this morning of her BP dropping and getting dizzy and light headed.  The pt has not passed out but has come close a few times.  She said everything will go black and she has to grab a hold to something to keep from falling.  The pt has already taken the medications that effect her BP. I advised the pt to push fluids and liberalize salt now. I made the pt aware that she would also benefit from compression stockings. I will discuss this pt with Dr Meda Coffee for further recommendations. (Amlodipine was stopped 02/03/14)

## 2014-02-24 ENCOUNTER — Encounter: Payer: Self-pay | Admitting: Cardiology

## 2014-02-24 ENCOUNTER — Ambulatory Visit (INDEPENDENT_AMBULATORY_CARE_PROVIDER_SITE_OTHER): Payer: Medicare Other | Admitting: Cardiology

## 2014-02-24 VITALS — BP 140/80 | HR 76 | Ht 69.0 in | Wt 153.0 lb

## 2014-02-24 DIAGNOSIS — I739 Peripheral vascular disease, unspecified: Secondary | ICD-10-CM

## 2014-02-24 DIAGNOSIS — I700 Atherosclerosis of aorta: Secondary | ICD-10-CM

## 2014-02-24 DIAGNOSIS — E785 Hyperlipidemia, unspecified: Secondary | ICD-10-CM

## 2014-02-24 DIAGNOSIS — I714 Abdominal aortic aneurysm, without rupture, unspecified: Secondary | ICD-10-CM

## 2014-02-24 DIAGNOSIS — J449 Chronic obstructive pulmonary disease, unspecified: Secondary | ICD-10-CM

## 2014-02-24 DIAGNOSIS — I2510 Atherosclerotic heart disease of native coronary artery without angina pectoris: Secondary | ICD-10-CM

## 2014-02-24 DIAGNOSIS — I10 Essential (primary) hypertension: Secondary | ICD-10-CM

## 2014-02-24 MED ORDER — FUROSEMIDE 40 MG PO TABS: 40.0000 mg | ORAL_TABLET | Freq: Every day | ORAL | Status: DC

## 2014-02-24 NOTE — Progress Notes (Signed)
Patient ID: Marie Johnson, female   DOB: 1951-07-05, 63 y.o.   MRN: [id number]    Patient Name: Marie Johnson Date of Encounter: 02/24/2014  Primary Care Provider:  Leonides Sake, MD Primary Cardiologist:  Dorothy Spark  Patient Profile  Chest pain  Problem List   Past Medical History  Diagnosis Date  . Arthritis   . Asthma   . GERD (gastroesophageal reflux disease)   . COPD (chronic obstructive pulmonary disease)   . Hyperlipidemia   . Hypertension   . Osteoporosis   . PAD (peripheral artery disease)     a. s/p L fem-pop bypass b. recent evaluation at [facility], unamenable to intervention  . CHD (congenital heart disease)   . CAD (coronary artery disease)     a. Nonobstructive CAD by cath 07/17/13.  [name] PUD (peptic ulcer disease)   . Aortic atherosclerosis   . AAA (abdominal aortic aneurysm)     3.4 cm  . Chest pain     a. Adm 10-07/2013 - CTA neg for PE/dissection, cath with nonobstructive disease, CP ?due to uncontrolled HTN vs coronary vasospasm.  [name] Anxiety   . Tobacco abuse    Past Surgical History  Procedure Laterality Date  . Vein bypass surgery      left leg  . Rotator cuff repair      right side, had torn ligaments as well  . Wrist arthrocentesis      left - broke in 3 places so has bars and screws placed  . Hip surgery      left - bars and screws placed  . Abdominal hysterectomy    . Tubal ligation    . Cardiac catheterization      several years ago, nonobstructive, "50-60% blockages"   Allergies  Allergies  Allergen Reactions  . Bee Venom Anaphylaxis   HPI  63 year old female, older appearing, lifelong smoker with prior medical history of hypertension, PAD who was recently evaluated for an abdominal hernia. There was an incidental finding of significant coronary calcifications on the CT and she was referred to [place] for further evaluation.  The patient states that she has exertional dyspnea and chest tightness with minimal exertion. As an example  she uses this walking one flight of stairs when she has to stop and it resolves within a few minutes. She denies any resting chest pain. Denies any palpitations or syncope. The pain feels pressure-like retrosternal with no radiation. She did have a cardiac cath by Odis Hollingshead many years ago with findings of non-obstructive coronary artery disease at that time.  There was 3.4 cm abdominal aortic aneurysm on her CT that is known for many years. The patient is a lifelong smoker, currently smoking one and one half packs per day. She has a h/o left femoropopliteal bypass by Dr. Donnetta Hutching 15 years ago. She complains of bilateral claudications left worse than right that start after walking minimal distances. She underwent arterial Doppler ultrasound bilaterally and lower extremity angiogram at [facility]. She was talled that she had severe diffuse peripheral arterial disease, however her lesions are distal and her vessels are small for interventions.  The patient had a negative stress test on 07/16/2013, however continuous chest pain. Therefore she underwent a cardiac catheterization on 10/31/ wirth findings of non-obstructive CAD. The patient was started on amlodipine for HTN and possible vasospasm. Today she is coming and reports significant improvement of symptoms. No chest pain and improved SOB. She has cut down on smoking cigarettes.  02/01/2014 -  the patient is coming after her 6 months. She states that she has been doing well until for about last week when she started to develop lower extremity edema. She also noticed worsening dyspnea on exertion and also occasional paroxysmal nocturnal dyspnea. Inhalers given her some relief only minimal. She continues to smoke heavily. She denies any chest pain. She also states that her blood pressure has been running low and she has felt dizzy and weak.  02/24/2014 - followup after 3 weeks, improved lower extremity edema. However patient is experiencing episodes of  orthostatic hypotension with blood pressure when standing in the 80s. She also has episodes of hypertension up to 156 they're associated with headaches. She continues to smoke.  Home Medications  Prior to Admission medications   Medication Sig Start Date End Date Taking? Authorizing Provider  atenolol (TENORMIN) 50 MG tablet 50 mg 2 (two) times daily.  06/09/13  Yes Historical Provider, MD  furosemide (LASIX) 40 MG tablet  06/09/13  Yes Historical Provider, MD  LORazepam (ATIVAN) 1 MG tablet  04/22/13  Yes Historical Provider, MD  losartan (COZAAR) 25 MG tablet  05/26/13  Yes Historical Provider, MD  NITROSTAT 0.4 MG SL tablet  06/24/13  Yes Historical Provider, MD  Oxycodone HCl 10 MG TABS  06/08/13  Yes Historical Provider, MD  pantoprazole (PROTONIX) 40 MG tablet  06/09/13  Yes Historical Provider, MD  pravastatin (PRAVACHOL) 40 MG tablet  05/27/13  Yes Historical Provider, MD  PROAIR HFA 108 (90 BASE) MCG/ACT inhaler  06/08/13  Yes Historical Provider, MD  traMADol Veatrice Bourbon) 50 MG tablet  06/14/13  Yes Historical Provider, MD   Family History  Family History  Problem Relation Age of Onset  . Cervical cancer Mother   . Heart disease Mother     MI in 65s  . Liver cancer Father   . Heart disease Father   . Lung cancer Maternal Aunt   . Prostate cancer Maternal Uncle     met. anal  . Breast cancer Maternal Grandmother   . Lung cancer Maternal Aunt   . Lung cancer Maternal Aunt   . Stomach cancer Maternal Aunt   . Colon cancer Maternal Aunt   . Lung cancer Maternal Uncle     Social History  History   Social History  . Marital Status: Married    Spouse Name: N/A    Number of Children: N/A  . Years of Education: N/A   Occupational History  . Not on file.   Social History Main Topics  . Smoking status: Current Every Day Smoker -- 1.00 packs/day    Types: Cigarettes  . Smokeless tobacco: Never Used  . Alcohol Use: No  . Drug Use: No  . Sexual Activity: Not on file   Other  Topics Concern  . Not on file   Social History Narrative  . No narrative on file     Review of Systems General:  No chills, fever, night sweats or weight changes.  Cardiovascular:  No chest pain, dyspnea on exertion, edema, orthopnea, palpitations, paroxysmal nocturnal dyspnea. Dermatological: No rash, lesions/masses Respiratory: No cough, dyspnea Urologic: No hematuria, dysuria Abdominal:   No nausea, vomiting, diarrhea, bright red blood per rectum, melena, or hematemesis Neurologic:  No visual changes, wkns, changes in mental status. All other systems reviewed and are otherwise negative except as noted above.  Physical Exam  Blood pressure 140/80, pulse 76, height $RemoveBe'5\' 9"'[id number]$  (1.753 m), weight 153 lb (69.4 kg), SpO2 96.00%.  General: Older appearang,  smelling like cigaretes, NAD Psych: Normal affect. Neuro: Alert and oriented X 3. Moves all extremities spontaneously. HEENT: Normal  Neck: Supple without bruits or JVD. Lungs:  Resp regular and unlabored, crackles B/L Heart: RRR no s3, s4, or murmurs. Abdomen: Soft, non-tender, non-distended, BS + x 4.  Extremities: No clubbing, cyanosis, B/L LE perimalleolar edema. Radials 2+ LE cold, no pulses  DP/PT/ bilaterally.  Accessory Clinical Findings  ECG - sinus bradycardia, 56 beats per minute, otherwise normal EKG.  Left heart cath: 07/17/13 Hemodynamics:  AO 150/65  LV 159/17  Coronary angiography:  Coronary dominance: right  Left mainstem: Very mild distal LM tapering.  Left anterior descending (LAD): Heavy proximal LAD calcification. 30-40% stenosis in the proximal LAD at the take-off of a small 2nd diagonal.  Left circumflex (LCx): Medium-sized ramus without signficant disease. AV LCx with 30-40% ostial stenosis. OM1 with 30% ostial stenosis.  Right coronary artery (RCA): There was prominent proximal to mid RCA calcification. 30% proximal, 30% mid RCA stenosis.  Left ventriculography: Not done to conserve contrast. Normal EF on  recent stress Cardiolite.  Final Conclusions: Nonobstructive CAD. I do not have an explanation for her chest pain. No PE or dissection on chest CT. I will keep her overnight to control her blood pressure. Start amlodipine for BP control and in case this could be coronary vasospasm. Will keep on NTG gtt for now while BP is still quite high.  Loralie Champagne  07/17/2013, 4:19 PM     Assessment & Plan  63 year old female lifelong smoker, older appearing, coming for concern of chest pain and coronary artery calcifications on CT.  1. chronic CHF with preserved left ventricular ejection fraction - we'll again increase Lasix to 40 mg daily and prescribe compression stockings. We'll check basic metabolic profile in 3 weeks. Normal BNP on May 18.  2. orthostatic hypotension - bisoprolol and amlodipine were discontinued. She is advised to take precautions when she is standing up.  3. Chest pain - cardiac cath showed non-obstructive CAD, started on amlodipine for HTN and possible vasospasm, complete resolution of symptoms. Continue aspirin, statin, atenolol, losartan, decreasing the amount of cigarettes  4. Hyperlipidemia - TAG 98, HDL 78, LDL 97, WNL, continue pravachol 40 mg po daily  5. PAD - severe B/L, however not amenable to intervention, the patient was consulted on smoking cessation, she is not interested  6. Smoking cessation -working on it, still smoking, discussed again  Check comprehensive metabolic profile in 3 weeks and followup in 6 month   Dorothy Spark, MD 02/24/2014, 9:11 AM

## 2014-02-24 NOTE — Patient Instructions (Signed)
Your physician has recommended you make the following change in your medication:   START TAKING LASIX 63 [place] physician recommends that you return for lab work in: [place] (BMET)  [place] MD [place]  Your physician wants you to follow-up in: [place] will receive a reminder letter in the mail two months in advance. If you don't receive a letter, please call our office to schedule the follow-up appointment.

## 2014-03-01 ENCOUNTER — Other Ambulatory Visit (HOSPITAL_COMMUNITY): Payer: Self-pay | Admitting: Physician Assistant

## 2014-03-03 ENCOUNTER — Telehealth: Payer: Self-pay | Admitting: *Deleted

## 2014-03-03 MED ORDER — PRAVASTATIN SODIUM 40 MG PO TABS: 40.0000 mg | ORAL_TABLET | Freq: Every evening | ORAL | Status: DC

## 2014-03-03 NOTE — Telephone Encounter (Signed)
Pravastatin 40 mg po QHS

## 2014-03-03 NOTE — Telephone Encounter (Signed)
Please advise on what statin and mg pt needs to be on per refill.

## 2014-03-17 ENCOUNTER — Other Ambulatory Visit (INDEPENDENT_AMBULATORY_CARE_PROVIDER_SITE_OTHER): Payer: Medicare Other

## 2014-03-17 DIAGNOSIS — I739 Peripheral vascular disease, unspecified: Secondary | ICD-10-CM

## 2014-03-17 DIAGNOSIS — I10 Essential (primary) hypertension: Secondary | ICD-10-CM

## 2014-03-17 DIAGNOSIS — I714 Abdominal aortic aneurysm, without rupture, unspecified: Secondary | ICD-10-CM

## 2014-03-17 DIAGNOSIS — E785 Hyperlipidemia, unspecified: Secondary | ICD-10-CM

## 2014-03-17 DIAGNOSIS — I700 Atherosclerosis of aorta: Secondary | ICD-10-CM

## 2014-03-17 DIAGNOSIS — J449 Chronic obstructive pulmonary disease, unspecified: Secondary | ICD-10-CM

## 2014-03-17 DIAGNOSIS — I2510 Atherosclerotic heart disease of native coronary artery without angina pectoris: Secondary | ICD-10-CM

## 2014-03-17 LAB — BASIC METABOLIC PANEL
BUN: 14 mg/dL (ref 6–23)
CO2: 29 mEq/L (ref 19–32)
Calcium: 9 mg/dL (ref 8.4–10.5)
Chloride: 97 mEq/L (ref 96–112)
Creatinine, Ser: 0.7 mg/dL (ref 0.4–1.2)
GFR: 85.08 mL/min (ref >60.00)
Glucose, Bld: 97 mg/dL (ref 70–99)
Potassium: 3.3 mEq/L — ABNORMAL LOW (ref 3.5–5.1)
Sodium: 134 mEq/L — ABNORMAL LOW (ref 135–145)

## 2014-07-09 ENCOUNTER — Other Ambulatory Visit: Payer: Self-pay | Admitting: Gastroenterology

## 2014-07-22 ENCOUNTER — Ambulatory Visit: Payer: Self-pay | Admitting: Gynecology

## 2014-07-29 ENCOUNTER — Other Ambulatory Visit: Payer: Self-pay | Admitting: Obstetrics and Gynecology

## 2014-08-23 ENCOUNTER — Ambulatory Visit (INDEPENDENT_AMBULATORY_CARE_PROVIDER_SITE_OTHER): Payer: Medicare Other | Admitting: Cardiology

## 2014-08-23 ENCOUNTER — Encounter: Payer: Self-pay | Admitting: Cardiology

## 2014-08-23 VITALS — BP 150/90 | HR 70 | Ht 69.0 in | Wt 153.0 lb

## 2014-08-23 DIAGNOSIS — I2510 Atherosclerotic heart disease of native coronary artery without angina pectoris: Secondary | ICD-10-CM

## 2014-08-23 DIAGNOSIS — I2583 Coronary atherosclerosis due to lipid rich plaque: Secondary | ICD-10-CM

## 2014-08-23 DIAGNOSIS — I10 Essential (primary) hypertension: Secondary | ICD-10-CM

## 2014-08-23 DIAGNOSIS — E785 Hyperlipidemia, unspecified: Secondary | ICD-10-CM

## 2014-08-23 DIAGNOSIS — I739 Peripheral vascular disease, unspecified: Secondary | ICD-10-CM

## 2014-08-23 DIAGNOSIS — F17200 Nicotine dependence, unspecified, uncomplicated: Secondary | ICD-10-CM

## 2014-08-23 DIAGNOSIS — I714 Abdominal aortic aneurysm, without rupture, unspecified: Secondary | ICD-10-CM

## 2014-08-23 DIAGNOSIS — Z720 Tobacco use: Secondary | ICD-10-CM

## 2014-08-23 LAB — CBC WITH DIFFERENTIAL/PLATELET
Basophils Absolute: 0 10*3/uL (ref 0.0–0.1)
Basophils Relative: 0.4 % (ref 0.0–3.0)
Eosinophils Absolute: 0.3 10*3/uL (ref 0.0–0.7)
Eosinophils Relative: 4.9 % (ref 0.0–5.0)
HCT: 41.1 % (ref 36.0–46.0)
Hemoglobin: 13.6 g/dL (ref 12.0–15.0)
Lymphocytes Relative: 34.2 % (ref 12.0–46.0)
Lymphs Abs: 2.3 10*3/uL (ref 0.7–4.0)
MCHC: 33 g/dL (ref 30.0–36.0)
MCV: 99.1 fl (ref 78.0–100.0)
Monocytes Absolute: 0.4 10*3/uL (ref 0.1–1.0)
Monocytes Relative: 6.5 % (ref 3.0–12.0)
Neutro Abs: 3.6 10*3/uL (ref 1.4–7.7)
Neutrophils Relative %: 54 % (ref 43.0–77.0)
Platelets: 264 10*3/uL (ref 150.0–400.0)
RBC: 4.15 Mil/uL (ref 3.87–5.11)
RDW: 16.5 % — ABNORMAL HIGH (ref 11.5–15.5)
WBC: 6.7 10*3/uL (ref 4.0–10.5)

## 2014-08-23 LAB — COMPREHENSIVE METABOLIC PANEL
ALT: 13 U/L (ref 0–35)
AST: 14 U/L (ref 0–37)
Albumin: 4 g/dL (ref 3.5–5.2)
Alkaline Phosphatase: 75 U/L (ref 39–117)
BUN: 19 mg/dL (ref 6–23)
CO2: 28 mEq/L (ref 19–32)
Calcium: 9.4 mg/dL (ref 8.4–10.5)
Chloride: 98 mEq/L (ref 96–112)
Creatinine, Ser: 0.8 mg/dL (ref 0.4–1.2)
GFR: 75.35 mL/min (ref >60.00)
Glucose, Bld: 84 mg/dL (ref 70–99)
Potassium: 4.7 mEq/L (ref 3.5–5.1)
Sodium: 133 mEq/L — ABNORMAL LOW (ref 135–145)
Total Bilirubin: 0.6 mg/dL (ref 0.2–1.2)
Total Protein: 7.4 g/dL (ref 6.0–8.3)

## 2014-08-23 MED ORDER — AMLODIPINE BESYLATE 2.5 MG PO TABS: 2.5000 mg | ORAL_TABLET | Freq: Every day | ORAL | Status: DC

## 2014-08-23 NOTE — Patient Instructions (Signed)
Your physician has recommended you make the following change in your medication:   START TAKING AMLODIPINE 2.5 MG ONCE DAILY     Your physician recommends that you return for lab work in: [place] physician wants you to follow-up in: [place] will receive a reminder letter in the mail two months in advance. If you don't receive a letter, please call our office to schedule the follow-up appointment.

## 2014-08-23 NOTE — Progress Notes (Signed)
Patient ID: Marie Johnson, female   DOB: 1951/04/05, 63 y.o.   MRN: [id number] Patient ID: Marie Johnson, female   DOB: [birth year], 63 y.o.   MRN: [id number]    Patient Name: Marie Johnson Date of Encounter: 08/23/2014  Primary Care Provider:  Leonides Sake, MD Primary Cardiologist:  Dorothy Spark  Patient Profile  Chest pain  Problem List   Past Medical History  Diagnosis Date  . Arthritis   . Asthma   . GERD (gastroesophageal reflux disease)   . COPD (chronic obstructive pulmonary disease)   . Hyperlipidemia   . Hypertension   . Osteoporosis   . PAD (peripheral artery disease)     a. s/p L fem-pop bypass b. recent evaluation at [facility], unamenable to intervention  . CHD (congenital heart disease)   . CAD (coronary artery disease)     a. Nonobstructive CAD by cath 07/17/13.  [name] PUD (peptic ulcer disease)   . Aortic atherosclerosis   . AAA (abdominal aortic aneurysm)     3.4 cm  . Chest pain     a. Adm 10-07/2013 - CTA neg for PE/dissection, cath with nonobstructive disease, CP ?due to uncontrolled HTN vs coronary vasospasm.  [name] Anxiety   . Tobacco abuse    Past Surgical History  Procedure Laterality Date  . Vein bypass surgery      left leg  . Rotator cuff repair      right side, had torn ligaments as well  . Wrist arthrocentesis      left - broke in 3 places so has bars and screws placed  . Hip surgery      left - bars and screws placed  . Abdominal hysterectomy    . Tubal ligation    . Cardiac catheterization      several years ago, nonobstructive, "50-60% blockages"   Allergies  Allergies  Allergen Reactions  . Bee Venom Anaphylaxis   HPI  63 year old female, older appearing, lifelong smoker with prior medical history of hypertension, PAD who was recently evaluated for an abdominal hernia. There was an incidental finding of significant coronary calcifications on the CT and she was referred to [place] for further evaluation.  The patient states that she  has exertional dyspnea and chest tightness with minimal exertion. As an example she uses this walking one flight of stairs when she has to stop and it resolves within a few minutes. She denies any resting chest pain. Denies any palpitations or syncope. The pain feels pressure-like retrosternal with no radiation. She did have a cardiac cath by Odis Hollingshead many years ago with findings of non-obstructive coronary artery disease at that time.  There was 3.4 cm abdominal aortic aneurysm on her CT that is known for many years. The patient is a lifelong smoker, currently smoking one and one half packs per day. She has a h/o left femoropopliteal bypass by Dr. Donnetta Hutching 15 years ago. She complains of bilateral claudications left worse than right that start after walking minimal distances. She underwent arterial Doppler ultrasound bilaterally and lower extremity angiogram at [facility]. She was talled that she had severe diffuse peripheral arterial disease, however her lesions are distal and her vessels are small for interventions.  The patient had a negative stress test on 07/16/2013, however continuous chest pain. Therefore she underwent a cardiac catheterization on 10/31/ wirth findings of non-obstructive CAD. The patient was started on amlodipine for HTN and possible vasospasm. Today she is coming and reports significant improvement of  symptoms. No chest pain and improved SOB. She has cut down on smoking cigarettes.  02/01/2014 - the patient is coming after her 6 months. She states that she has been doing well until for about last week when she started to develop lower extremity edema. She also noticed worsening dyspnea on exertion and also occasional paroxysmal nocturnal dyspnea. Inhalers given her some relief only minimal. She continues to smoke heavily. She denies any chest pain. She also states that her blood pressure has been running low and she has felt dizzy and weak.  02/24/2014 - followup after 3 weeks,  improved lower extremity edema. However patient is experiencing episodes of orthostatic hypotension with blood pressure when standing in the 80s. She also has episodes of hypertension up to 156 they're associated with headaches. She continues to smoke.  08/23/2014 - the patient feels ok, no chest pain or SOB, she is trying to walk and would get SOB on moderate activity. She continues to smoke and doesn't feel motivated to stop. She experiences the same claudications - after about half a mile. No orthopnea, PND. Her edema have resolved with added lasix and compression stockings.   Home Medications  Prior to Admission medications   Medication Sig Start Date End Date Taking? Authorizing Provider  atenolol (TENORMIN) 50 MG tablet 50 mg 2 (two) times daily.  06/09/13  Yes Historical Provider, MD  furosemide (LASIX) 40 MG tablet  06/09/13  Yes Historical Provider, MD  LORazepam (ATIVAN) 1 MG tablet  04/22/13  Yes Historical Provider, MD  losartan (COZAAR) 25 MG tablet  05/26/13  Yes Historical Provider, MD  NITROSTAT 0.4 MG SL tablet  06/24/13  Yes Historical Provider, MD  Oxycodone HCl 10 MG TABS  06/08/13  Yes Historical Provider, MD  pantoprazole (PROTONIX) 40 MG tablet  06/09/13  Yes Historical Provider, MD  pravastatin (PRAVACHOL) 40 MG tablet  05/27/13  Yes Historical Provider, MD  PROAIR HFA 108 (90 BASE) MCG/ACT inhaler  06/08/13  Yes Historical Provider, MD  traMADol Veatrice Bourbon) 50 MG tablet  06/14/13  Yes Historical Provider, MD   Family History  Family History  Problem Relation Age of Onset  . Cervical cancer Mother   . Heart disease Mother     MI in 29s  . Liver cancer Father   . Heart disease Father   . Lung cancer Maternal Aunt   . Prostate cancer Maternal Uncle     met. anal  . Breast cancer Maternal Grandmother   . Lung cancer Maternal Aunt   . Lung cancer Maternal Aunt   . Stomach cancer Maternal Aunt   . Colon cancer Maternal Aunt   . Lung cancer Maternal Uncle     Social  History  History   Social History  . Marital Status: Married    Spouse Name: N/A    Number of Children: N/A  . Years of Education: N/A   Occupational History  . Not on file.   Social History Main Topics  . Smoking status: Current Every Day Smoker -- 1.00 packs/day    Types: Cigarettes  . Smokeless tobacco: Never Used  . Alcohol Use: No  . Drug Use: No  . Sexual Activity: Not on file   Other Topics Concern  . Not on file   Social History Narrative     Review of Systems General:  No chills, fever, night sweats or weight changes.  Cardiovascular:  No chest pain, dyspnea on exertion, edema, orthopnea, palpitations, paroxysmal nocturnal dyspnea. Dermatological: No rash, lesions/masses  Respiratory: No cough, dyspnea Urologic: No hematuria, dysuria Abdominal:   No nausea, vomiting, diarrhea, bright red blood per rectum, melena, or hematemesis Neurologic:  No visual changes, wkns, changes in mental status. All other systems reviewed and are otherwise negative except as noted above.  Physical Exam  Blood pressure 150/90, pulse 70, height 5' 9"  (1.753 m), weight 153 lb (69.4 kg).  General: Older appearang, smelling like cigaretes, NAD Psych: Normal affect. Neuro: Alert and oriented X 3. Moves all extremities spontaneously. HEENT: Normal  Neck: Supple without bruits or JVD. Lungs:  Resp regular and unlabored, crackles B/L Heart: RRR no s3, s4, or murmurs. Abdomen: Soft, non-tender, non-distended, BS + x 4.  Extremities: No clubbing, cyanosis, B/L LE perimalleolar edema. Radials 2+ LE cold, no pulses  DP/PT/ bilaterally.  Accessory Clinical Findings  ECG - sinus bradycardia, 56 beats per minute, otherwise normal EKG.  Left heart cath: 07/17/13 Hemodynamics:  AO 150/65  LV 159/17  Coronary angiography:  Coronary dominance: right  Left mainstem: Very mild distal LM tapering.  Left anterior descending (LAD): Heavy proximal LAD calcification. 30-40% stenosis in the  proximal LAD at the take-off of a small 2nd diagonal.  Left circumflex (LCx): Medium-sized ramus without signficant disease. AV LCx with 30-40% ostial stenosis. OM1 with 30% ostial stenosis.  Right coronary artery (RCA): There was prominent proximal to mid RCA calcification. 30% proximal, 30% mid RCA stenosis.  Left ventriculography: Not done to conserve contrast. Normal EF on recent stress Cardiolite.  Final Conclusions: Nonobstructive CAD. I do not have an explanation for her chest pain. No PE or dissection on chest CT. I will keep her overnight to control her blood pressure. Start amlodipine for BP control and in case this could be coronary vasospasm. Will keep on NTG gtt for now while BP is still quite high.  Loralie Champagne  07/17/2013, 4:19 PM     Assessment & Plan  63 year old female lifelong smoker, older appearing, coming for concern of chest pain and coronary artery calcifications on CT.  1. Chronic CHF with preserved left ventricular ejection fraction - improved with Lasix to 40 mg daily and compression stockings. We'll check basic metabolic profile today. Normal BNP on May 18.  2. Orthostatic hypotension - bisoprolol was discontinued. She is advised to take precautions when she is standing up.  3. Hypertension - add amlodipine 2.5 mg po daily. Chest pain post lisinopril in the past.  4. Chest pain - cardiac cath showed non-obstructive CAD, started on amlodipine for HTN and possible vasospasm, complete resolution of symptoms. Continue aspirin, statin, atenolol, losartan, decreasing the amount of cigarettes  5. Hyperlipidemia - TAG 98, HDL 78, LDL 97, WNL, continue pravachol 40 mg po daily  6. PAD - severe B/L, however not amenable to intervention, the patient was consulted on smoking cessation, she is not interested  Check comprehensive metabolic profile in 3 weeks and followup in 6 month   Dorothy Spark, MD 08/23/2014, 8:59 AM

## 2014-08-26 ENCOUNTER — Encounter (HOSPITAL_COMMUNITY): Payer: Self-pay | Admitting: Cardiology

## 2014-11-29 ENCOUNTER — Other Ambulatory Visit: Payer: Self-pay | Admitting: Obstetrics and Gynecology

## 2014-11-30 LAB — CYTOLOGY - PAP

## 2015-01-27 ENCOUNTER — Other Ambulatory Visit: Payer: Self-pay | Admitting: Cardiology

## 2015-01-28 NOTE — Telephone Encounter (Signed)
Refilled the pts KCL per Dr Meda Coffee for 20 mEq po daily.  Sent this in to pts pharmacy of choice.

## 2015-01-28 NOTE — Telephone Encounter (Signed)
Yes, she is taking 20 mEq daily.

## 2015-02-21 ENCOUNTER — Ambulatory Visit (INDEPENDENT_AMBULATORY_CARE_PROVIDER_SITE_OTHER): Payer: PPO | Admitting: Cardiology

## 2015-02-21 ENCOUNTER — Encounter: Payer: Self-pay | Admitting: Cardiology

## 2015-02-21 VITALS — BP 142/82 | HR 72 | Ht 69.0 in | Wt 157.0 lb

## 2015-02-21 DIAGNOSIS — I739 Peripheral vascular disease, unspecified: Secondary | ICD-10-CM

## 2015-02-21 DIAGNOSIS — I5032 Chronic diastolic (congestive) heart failure: Secondary | ICD-10-CM

## 2015-02-21 DIAGNOSIS — Z720 Tobacco use: Secondary | ICD-10-CM

## 2015-02-21 DIAGNOSIS — F17200 Nicotine dependence, unspecified, uncomplicated: Secondary | ICD-10-CM

## 2015-02-21 DIAGNOSIS — E785 Hyperlipidemia, unspecified: Secondary | ICD-10-CM

## 2015-02-21 DIAGNOSIS — R072 Precordial pain: Secondary | ICD-10-CM | POA: Diagnosis not present

## 2015-02-21 DIAGNOSIS — I10 Essential (primary) hypertension: Secondary | ICD-10-CM | POA: Diagnosis not present

## 2015-02-21 LAB — BASIC METABOLIC PANEL
BUN: 20 mg/dL (ref 6–23)
CO2: 27 mEq/L (ref 19–32)
Calcium: 9.8 mg/dL (ref 8.4–10.5)
Chloride: 103 mEq/L (ref 96–112)
Creatinine, Ser: 0.76 mg/dL (ref 0.40–1.20)
GFR: 80.98 mL/min (ref >60.00)
Glucose, Bld: 96 mg/dL (ref 70–99)
Potassium: 3.8 mEq/L (ref 3.5–5.1)
Sodium: 137 mEq/L (ref 135–145)

## 2015-02-21 LAB — LIPID PANEL
Cholesterol: 179 mg/dL (ref 0–200)
HDL: 47.9 mg/dL (ref >39.00)
LDL Cholesterol: 113 mg/dL — ABNORMAL HIGH (ref 0–99)
NonHDL: 131.1
Total CHOL/HDL Ratio: 4
Triglycerides: 90 mg/dL (ref 0.0–149.0)
VLDL: 18 mg/dL (ref 0.0–40.0)

## 2015-02-21 LAB — CBC WITH DIFFERENTIAL/PLATELET
Basophils Absolute: 0 10*3/uL (ref 0.0–0.1)
Basophils Relative: 0.4 % (ref 0.0–3.0)
Eosinophils Absolute: 0.4 10*3/uL (ref 0.0–0.7)
Eosinophils Relative: 4.8 % (ref 0.0–5.0)
HCT: 42.6 % (ref 36.0–46.0)
Hemoglobin: 14.2 g/dL (ref 12.0–15.0)
Lymphocytes Relative: 35 % (ref 12.0–46.0)
Lymphs Abs: 2.6 10*3/uL (ref 0.7–4.0)
MCHC: 33.3 g/dL (ref 30.0–36.0)
MCV: 99.2 fl (ref 78.0–100.0)
Monocytes Absolute: 0.5 10*3/uL (ref 0.1–1.0)
Monocytes Relative: 6.7 % (ref 3.0–12.0)
Neutro Abs: 4 10*3/uL (ref 1.4–7.7)
Neutrophils Relative %: 53.1 % (ref 43.0–77.0)
Platelets: 258 10*3/uL (ref 150.0–400.0)
RBC: 4.29 Mil/uL (ref 3.87–5.11)
RDW: 17.1 % — ABNORMAL HIGH (ref 11.5–15.5)
WBC: 7.6 10*3/uL (ref 4.0–10.5)

## 2015-02-21 LAB — HEPATIC FUNCTION PANEL
ALT: 15 U/L (ref 0–35)
AST: 16 U/L (ref 0–37)
Albumin: 4.4 g/dL (ref 3.5–5.2)
Alkaline Phosphatase: 82 U/L (ref 39–117)
Bilirubin, Direct: 0.1 mg/dL (ref 0.0–0.3)
Total Bilirubin: 0.6 mg/dL (ref 0.2–1.2)
Total Protein: 7.5 g/dL (ref 6.0–8.3)

## 2015-02-21 NOTE — Patient Instructions (Signed)
Medication Instructions:  Your physician recommends that you continue on your current medications as directed. Please refer to the Current Medication list given to you today.  Labwork: Your physician recommends that you have labs today. BMET CBC LFTs and Lipids   Testing/Procedures: NONE  Follow-Up: Your physician wants you to follow-up in: 1 year with Dr. Meda Coffee. You will receive a reminder letter in the mail two months in advance. If you don't receive a letter, please call our office to schedule the follow-up appointment.   Any Other Special Instructions Will Be Listed Below (If Applicable).

## 2015-02-21 NOTE — Progress Notes (Signed)
Patient ID: Marie Johnson, female   DOB: [birth year], 64 y.o.   MRN: [id number]    Patient Name: Marie Johnson Date of Encounter: 02/21/2015  Primary Care Provider:  Leonides Sake, MD Primary Cardiologist:  Dorothy Spark  Chief complain: 6 months follow up for chest pain and CHF pEF  Problem List   Past Medical History  Diagnosis Date  . Arthritis   . Asthma   . GERD (gastroesophageal reflux disease)   . COPD (chronic obstructive pulmonary disease)   . Hyperlipidemia   . Hypertension   . Osteoporosis   . PAD (peripheral artery disease)     a. s/p L fem-pop bypass b. recent evaluation at [facility], unamenable to intervention  . CHD (congenital heart disease)   . CAD (coronary artery disease)     a. Nonobstructive CAD by cath 07/17/13.  [name] PUD (peptic ulcer disease)   . Aortic atherosclerosis   . AAA (abdominal aortic aneurysm)     3.4 cm  . Chest pain     a. Adm 10-07/2013 - CTA neg for PE/dissection, cath with nonobstructive disease, CP ?due to uncontrolled HTN vs coronary vasospasm.  [name] Anxiety   . Tobacco abuse    Past Surgical History  Procedure Laterality Date  . Vein bypass surgery      left leg  . Rotator cuff repair      right side, had torn ligaments as well  . Wrist arthrocentesis      left - broke in 3 places so has bars and screws placed  . Hip surgery      left - bars and screws placed  . Abdominal hysterectomy    . Tubal ligation    . Cardiac catheterization      several years ago, nonobstructive, "50-60% blockages"  . Left heart catheterization with coronary angiogram N/A 07/17/2013    Procedure: LEFT HEART CATHETERIZATION WITH CORONARY ANGIOGRAM;  Surgeon: Larey Dresser, MD;  Location: [facility] CATH LAB;  Service: Cardiovascular;  Laterality: N/A;   Allergies  Allergies  Allergen Reactions  . Bee Venom Anaphylaxis   HPI  64 year old female, older appearing, lifelong smoker with prior medical history of hypertension, PAD who was recently evaluated  for an abdominal hernia. There was an incidental finding of significant coronary calcifications on the CT and she was referred to [place] for further evaluation.  The patient states that she has exertional dyspnea and chest tightness with minimal exertion. As an example she uses this walking one flight of stairs when she has to stop and it resolves within a few minutes. She denies any resting chest pain. Denies any palpitations or syncope. The pain feels pressure-like retrosternal with no radiation. She did have a cardiac cath by Odis Hollingshead many years ago with findings of non-obstructive coronary artery disease at that time.  There was 3.4 cm abdominal aortic aneurysm on her CT that is known for many years. The patient is a lifelong smoker, currently smoking one and one half packs per day. She has a h/o left femoropopliteal bypass by Dr. Donnetta Hutching 15 years ago. She complains of bilateral claudications left worse than right that start after walking minimal distances. She underwent arterial Doppler ultrasound bilaterally and lower extremity angiogram at [facility]. She was talled that she had severe diffuse peripheral arterial disease, however her lesions are distal and her vessels are small for interventions.  The patient had a negative stress test on 07/16/2013, however continuous chest pain. Therefore she underwent a cardiac  catheterization on 07/17/13 wirth findings of non-obstructive CAD. The patient was started on amlodipine for HTN and possible vasospasm. Today she is coming and reports significant improvement of symptoms. No chest pain and improved SOB. She has cut down on smoking cigarettes.  02/01/2014 - the patient is coming after her 6 months. She states that she has been doing well until for about last week when she started to develop lower extremity edema. She also noticed worsening dyspnea on exertion and also occasional paroxysmal nocturnal dyspnea. Inhalers given her some relief. She continues to  smoke heavily. She denies any chest pain. She also states that her blood pressure has been running low and she has felt dizzy and weak.  02/24/2014 - followup after 3 weeks, improved lower extremity edema. However patient is experiencing episodes of orthostatic hypotension with blood pressure when standing in the 80s. She also has episodes of hypertension up to 156 they're associated with headaches. She continues to smoke.  08/23/2014 - the patient feels ok, no chest pain or SOB, she is trying to walk and would get SOB on moderate activity. She continues to smoke and doesn't feel motivated to stop. She experiences the same claudications - after about half a mile. No orthopnea, PND. Her edema have resolved with added lasix and compression stockings.  02/21/2015 - the patient has stable DOE with mild exertion, no orthopnea, PND, no chest pain, complaint to her meds. Continues to smoke 1/2 PPD. Her PCP is trying to cut down on her Ativan that she has been taking for the last 12 years and she is asking for more from our office. She has stable claudications.    Home Medications  Prior to Admission medications   Medication Sig Start Date End Date Taking? Authorizing Provider  atenolol (TENORMIN) 50 MG tablet 50 mg 2 (two) times daily.  06/09/13  Yes Historical Provider, MD  furosemide (LASIX) 40 MG tablet  06/09/13  Yes Historical Provider, MD  LORazepam (ATIVAN) 1 MG tablet  04/22/13  Yes Historical Provider, MD  losartan (COZAAR) 25 MG tablet  05/26/13  Yes Historical Provider, MD  NITROSTAT 0.4 MG SL tablet  06/24/13  Yes Historical Provider, MD  Oxycodone HCl 10 MG TABS  06/08/13  Yes Historical Provider, MD  pantoprazole (PROTONIX) 40 MG tablet  06/09/13  Yes Historical Provider, MD  pravastatin (PRAVACHOL) 40 MG tablet  05/27/13  Yes Historical Provider, MD  PROAIR HFA 108 (90 BASE) MCG/ACT inhaler  06/08/13  Yes Historical Provider, MD  traMADol Veatrice Bourbon) 50 MG tablet  06/14/13  Yes Historical Provider, MD    Family History  Family History  Problem Relation Age of Onset  . Cervical cancer Mother   . Heart disease Mother     MI in 39s  . Liver cancer Father   . Heart disease Father   . Lung cancer Maternal Aunt   . Prostate cancer Maternal Uncle     met. anal  . Breast cancer Maternal Grandmother   . Lung cancer Maternal Aunt   . Lung cancer Maternal Aunt   . Stomach cancer Maternal Aunt   . Colon cancer Maternal Aunt   . Lung cancer Maternal Uncle     Social History  History   Social History  . Marital Status: Married    Spouse Name: N/A  . Number of Children: N/A  . Years of Education: N/A   Occupational History  . Not on file.   Social History Main Topics  . Smoking status: Current  Every Day Smoker -- 1.00 packs/day    Types: Cigarettes  . Smokeless tobacco: Never Used  . Alcohol Use: No  . Drug Use: No  . Sexual Activity: Not on file   Other Topics Concern  . Not on file   Social History Narrative     Review of Systems General:  No chills, fever, night sweats or weight changes.  Cardiovascular:  No chest pain, dyspnea on exertion, edema, orthopnea, palpitations, paroxysmal nocturnal dyspnea. Dermatological: No rash, lesions/masses Respiratory: No cough, dyspnea Urologic: No hematuria, dysuria Abdominal:   No nausea, vomiting, diarrhea, bright red blood per rectum, melena, or hematemesis Neurologic:  No visual changes, wkns, changes in mental status. All other systems reviewed and are otherwise negative except as noted above.  Physical Exam  128/72, HR 78, W 152 lbs General: Older appearang, smelling like cigaretes, NAD Psych: Normal affect. Neuro: Alert and oriented X 3. Moves all extremities spontaneously. HEENT: Normal  Neck: Supple without bruits or JVD. Lungs:  Resp regular and unlabored, wheezes B/L Heart: RRR no s3, s4, or murmurs. Abdomen: Soft, non-tender, non-distended, BS + x 4.  Extremities: No clubbing, cyanosis, B/L LE perimalleolar  edema. Radials 2+ LE cold, no pulses  DP/PT/ bilaterally.  Accessory Clinical Findings  ECG - sinus bradycardia, 56 beats per minute, otherwise normal EKG.  Left heart cath: 07/17/13 Hemodynamics:  AO 150/65  LV 159/17  Coronary angiography:  Coronary dominance: right  Left mainstem: Very mild distal LM tapering.  Left anterior descending (LAD): Heavy proximal LAD calcification. 30-40% stenosis in the proximal LAD at the take-off of a small 2nd diagonal.  Left circumflex (LCx): Medium-sized ramus without signficant disease. AV LCx with 30-40% ostial stenosis. OM1 with 30% ostial stenosis.  Right coronary artery (RCA): There was prominent proximal to mid RCA calcification. 30% proximal, 30% mid RCA stenosis.  Left ventriculography: Not done to conserve contrast. Normal EF on recent stress Cardiolite.  Final Conclusions: Nonobstructive CAD. I do not have an explanation for her chest pain. No PE or dissection on chest CT. I will keep her overnight to control her blood pressure. Start amlodipine for BP control and in case this could be coronary vasospasm. Will keep on NTG gtt for now while BP is still quite high.  Loralie Champagne  07/17/2013, 4:19 PM     Assessment & Plan  64 year old female lifelong smoker, older appearing, coming for concern of chest pain and coronary artery calcifications on CT.  1. Chronic CHF with preserved left ventricular ejection fraction - improved with Lasix to 40 mg daily and compression stockings. Crea 0.8 in 08/2014.  2. Orthostatic hypotension - bisoprolol was discontinued. She is advised to take precautions when she is standing up.  3. Hypertension - added amlodipine 2.5 mg po daily. Chest pain post lisinopril in the past.  4. Chest pain - cardiac cath showed non-obstructive CAD, started on amlodipine for HTN and possible vasospasm, complete resolution of symptoms. Continue aspirin, statin, atenolol, losartan, decreasing the amount of cigarettes  5.  Hyperlipidemia - TAG 98, HDL 78, LDL 97, WNL, continue pravachol 40 mg po daily  6. PAD - severe B/L, however not amenable to intervention, the patient was consulted on smoking cessation, she is not interested  7. Narcotics/anxiolytics addiction - she is explained that we will not prescribe any, she accepts it.  Check comprehensive metabolic profile, CBC, lipid profile today and followup in 1 year.   Dorothy Spark, MD 02/21/2015, 5:42 AM

## 2015-06-03 ENCOUNTER — Other Ambulatory Visit: Payer: Self-pay | Admitting: Obstetrics and Gynecology

## 2015-06-07 LAB — CYTOLOGY - PAP

## 2015-07-11 ENCOUNTER — Other Ambulatory Visit: Payer: Self-pay | Admitting: *Deleted

## 2015-07-11 DIAGNOSIS — I739 Peripheral vascular disease, unspecified: Secondary | ICD-10-CM

## 2015-07-11 DIAGNOSIS — I2510 Atherosclerotic heart disease of native coronary artery without angina pectoris: Secondary | ICD-10-CM

## 2015-07-11 DIAGNOSIS — I700 Atherosclerosis of aorta: Secondary | ICD-10-CM

## 2015-07-11 MED ORDER — FUROSEMIDE 40 MG PO TABS: 40.0000 mg | ORAL_TABLET | Freq: Every day | ORAL | Status: DC

## 2015-11-07 ENCOUNTER — Other Ambulatory Visit: Payer: Self-pay

## 2015-11-07 DIAGNOSIS — I10 Essential (primary) hypertension: Secondary | ICD-10-CM

## 2015-11-07 DIAGNOSIS — E785 Hyperlipidemia, unspecified: Secondary | ICD-10-CM

## 2015-11-07 DIAGNOSIS — I739 Peripheral vascular disease, unspecified: Secondary | ICD-10-CM

## 2015-11-07 MED ORDER — AMLODIPINE BESYLATE 2.5 MG PO TABS: 2.5000 mg | ORAL_TABLET | Freq: Every day | ORAL | Status: DC

## 2015-11-28 DIAGNOSIS — Z124 Encounter for screening for malignant neoplasm of cervix: Secondary | ICD-10-CM | POA: Diagnosis not present

## 2015-11-28 DIAGNOSIS — Z779 Other contact with and (suspected) exposures hazardous to health: Secondary | ICD-10-CM | POA: Diagnosis not present

## 2015-11-28 DIAGNOSIS — N893 Dysplasia of vagina, unspecified: Secondary | ICD-10-CM | POA: Diagnosis not present

## 2015-12-14 DIAGNOSIS — R7303 Prediabetes: Secondary | ICD-10-CM | POA: Diagnosis not present

## 2015-12-14 DIAGNOSIS — G5790 Unspecified mononeuropathy of unspecified lower limb: Secondary | ICD-10-CM | POA: Diagnosis not present

## 2015-12-14 DIAGNOSIS — E7800 Pure hypercholesterolemia, unspecified: Secondary | ICD-10-CM | POA: Diagnosis not present

## 2015-12-14 DIAGNOSIS — M109 Gout, unspecified: Secondary | ICD-10-CM | POA: Diagnosis not present

## 2015-12-14 DIAGNOSIS — Z79899 Other long term (current) drug therapy: Secondary | ICD-10-CM | POA: Diagnosis not present

## 2015-12-14 DIAGNOSIS — Z6823 Body mass index (BMI) 23.0-23.9, adult: Secondary | ICD-10-CM | POA: Diagnosis not present

## 2015-12-14 DIAGNOSIS — I714 Abdominal aortic aneurysm, without rupture: Secondary | ICD-10-CM | POA: Diagnosis not present

## 2015-12-14 DIAGNOSIS — I10 Essential (primary) hypertension: Secondary | ICD-10-CM | POA: Diagnosis not present

## 2015-12-14 DIAGNOSIS — M25531 Pain in right wrist: Secondary | ICD-10-CM | POA: Diagnosis not present

## 2015-12-27 DIAGNOSIS — I714 Abdominal aortic aneurysm, without rupture: Secondary | ICD-10-CM | POA: Diagnosis not present

## 2015-12-27 DIAGNOSIS — F17200 Nicotine dependence, unspecified, uncomplicated: Secondary | ICD-10-CM | POA: Diagnosis not present

## 2016-01-17 ENCOUNTER — Other Ambulatory Visit: Payer: Self-pay

## 2016-01-17 ENCOUNTER — Other Ambulatory Visit: Payer: Self-pay | Admitting: Cardiology

## 2016-01-17 MED ORDER — POTASSIUM CHLORIDE CRYS ER 20 MEQ PO TBCR: 20.0000 meq | EXTENDED_RELEASE_TABLET | Freq: Every day | ORAL | Status: DC

## 2016-02-22 ENCOUNTER — Encounter: Payer: Self-pay | Admitting: Cardiology

## 2016-02-22 ENCOUNTER — Ambulatory Visit (INDEPENDENT_AMBULATORY_CARE_PROVIDER_SITE_OTHER): Payer: PPO | Admitting: Cardiology

## 2016-02-22 VITALS — BP 124/72 | HR 78 | Ht 69.0 in | Wt 158.8 lb

## 2016-02-22 DIAGNOSIS — Z720 Tobacco use: Secondary | ICD-10-CM

## 2016-02-22 DIAGNOSIS — E785 Hyperlipidemia, unspecified: Secondary | ICD-10-CM | POA: Diagnosis not present

## 2016-02-22 DIAGNOSIS — I5032 Chronic diastolic (congestive) heart failure: Secondary | ICD-10-CM

## 2016-02-22 DIAGNOSIS — I10 Essential (primary) hypertension: Secondary | ICD-10-CM | POA: Diagnosis not present

## 2016-02-22 DIAGNOSIS — F17200 Nicotine dependence, unspecified, uncomplicated: Secondary | ICD-10-CM

## 2016-02-22 DIAGNOSIS — I2510 Atherosclerotic heart disease of native coronary artery without angina pectoris: Secondary | ICD-10-CM

## 2016-02-22 DIAGNOSIS — I2583 Coronary atherosclerosis due to lipid rich plaque: Secondary | ICD-10-CM

## 2016-02-22 DIAGNOSIS — R002 Palpitations: Secondary | ICD-10-CM | POA: Diagnosis not present

## 2016-02-22 DIAGNOSIS — R072 Precordial pain: Secondary | ICD-10-CM

## 2016-02-22 DIAGNOSIS — R079 Chest pain, unspecified: Secondary | ICD-10-CM | POA: Insufficient documentation

## 2016-02-22 DIAGNOSIS — I739 Peripheral vascular disease, unspecified: Secondary | ICD-10-CM

## 2016-02-22 HISTORY — DX: Palpitations: R00.2

## 2016-02-22 LAB — COMPREHENSIVE METABOLIC PANEL
ALT: 11 U/L (ref 6–29)
AST: 11 U/L (ref 10–35)
Albumin: 4.4 g/dL (ref 3.6–5.1)
Alkaline Phosphatase: 44 U/L (ref 33–130)
BUN: 21 mg/dL (ref 7–25)
CO2: 22 mmol/L (ref 20–31)
Calcium: 9.4 mg/dL (ref 8.6–10.4)
Chloride: 101 mmol/L (ref 98–110)
Creat: 0.83 mg/dL (ref 0.50–0.99)
Glucose, Bld: 86 mg/dL (ref 65–99)
Potassium: 4.4 mmol/L (ref 3.5–5.3)
Sodium: 138 mmol/L (ref 135–146)
Total Bilirubin: 0.6 mg/dL (ref 0.2–1.2)
Total Protein: 7.1 g/dL (ref 6.1–8.1)

## 2016-02-22 LAB — CBC WITH DIFFERENTIAL/PLATELET
Basophils Absolute: 0 cells/uL (ref 0–200)
Basophils Relative: 0 %
Eosinophils Absolute: 154 cells/uL (ref 15–500)
Eosinophils Relative: 2 %
HCT: 40.5 % (ref 35.0–45.0)
Hemoglobin: 13.7 g/dL (ref 11.7–15.5)
Lymphocytes Relative: 37 %
Lymphs Abs: 2849 cells/uL (ref 850–3900)
MCH: 32.7 pg (ref 27.0–33.0)
MCHC: 33.8 g/dL (ref 32.0–36.0)
MCV: 96.7 fL (ref 80.0–100.0)
MPV: 10 fL (ref 7.5–12.5)
Monocytes Absolute: 385 cells/uL (ref 200–950)
Monocytes Relative: 5 %
Neutro Abs: 4312 cells/uL (ref 1500–7800)
Neutrophils Relative %: 56 %
Platelets: 264 10*3/uL (ref 140–400)
RBC: 4.19 MIL/uL (ref 3.80–5.10)
RDW: 17.6 % — ABNORMAL HIGH (ref 11.0–15.0)
WBC: 7.7 10*3/uL (ref 3.8–10.8)

## 2016-02-22 LAB — LIPID PANEL
Cholesterol: 154 mg/dL (ref 125–200)
HDL: 51 mg/dL (ref >=46)
LDL Cholesterol: 87 mg/dL (ref <130)
Total CHOL/HDL Ratio: 3 Ratio (ref <=5.0)
Triglycerides: 82 mg/dL (ref <150)
VLDL: 16 mg/dL (ref <30)

## 2016-02-22 LAB — TSH: TSH: 0.49 mIU/L

## 2016-02-22 LAB — MAGNESIUM: Magnesium: 1.8 mg/dL (ref 1.5–2.5)

## 2016-02-22 NOTE — Patient Instructions (Signed)
Medication Instructions:   Your physician recommends that you continue on your current medications as directed. Please refer to the Current Medication list given to you today.   Labwork:  TODAY--CMET, CBC W DIFF, TSH, LIPIDS, AND MAGNESIUM   Testing/Procedures:  Your physician has recommended that you wear a 48 HOUR  holter monitor. Holter monitors are medical devices that record the heart's electrical activity. Doctors most often use these monitors to diagnose arrhythmias. Arrhythmias are problems with the speed or rhythm of the heartbeat. The monitor is a small, portable device. You can wear one while you do your normal daily activities. This is usually used to diagnose what is causing palpitations/syncope (passing out).   Your physician has requested that you have an echocardiogram. Echocardiography is a painless test that uses sound waves to create images of your heart. It provides your doctor with information about the size and shape of your heart and how well your heart's chambers and valves are working. This procedure takes approximately one hour. There are no restrictions for this procedure.   Your physician has requested that you have a dobutamine echocardiogram. For further information please visit [email or web address]. Please follow instruction sheet as given.    Follow-Up:  AT DR NELSON'S VERY NEXT AVAILABLE APPOINTMENT      If you need a refill on your cardiac medications before your next appointment, please call your pharmacy.

## 2016-02-22 NOTE — Progress Notes (Signed)
Patient ID: KIASIA CHOU, female   DOB: 1951/01/30, 65 y.o.   MRN: [id number]    Patient Name: MICHEL ESKELSON Date of Encounter: 02/22/2016  Primary Care Provider:  Leonides Sake, MD Primary Cardiologist:  Ena Dawley  Chief complain: 6 months follow up for chest pain and palpitations  Problem List   Past Medical History  Diagnosis Date  . Arthritis   . Asthma   . GERD (gastroesophageal reflux disease)   . COPD (chronic obstructive pulmonary disease) ([place])   . Hyperlipidemia   . Hypertension   . Osteoporosis   . PAD (peripheral artery disease) ([place])     a. s/p L fem-pop bypass b. recent evaluation at [facility], unamenable to intervention  . CHD (congenital heart disease)   . CAD (coronary artery disease)     a. Nonobstructive CAD by cath 07/17/13.  [name] PUD (peptic ulcer disease)   . Aortic atherosclerosis ([place])   . AAA (abdominal aortic aneurysm) ([facility])     3.4 cm  . Chest pain     a. Adm 10-07/2013 - CTA neg for PE/dissection, cath with nonobstructive disease, CP ?due to uncontrolled HTN vs coronary vasospasm.  [name] Anxiety   . Tobacco abuse    Past Surgical History  Procedure Laterality Date  . Vein bypass surgery      left leg  . Rotator cuff repair      right side, had torn ligaments as well  . Wrist arthrocentesis      left - broke in 3 places so has bars and screws placed  . Hip surgery      left - bars and screws placed  . Abdominal hysterectomy    . Tubal ligation    . Cardiac catheterization      several years ago, nonobstructive, "50-60% blockages"  . Left heart catheterization with coronary angiogram N/A 07/17/2013    Procedure: LEFT HEART CATHETERIZATION WITH CORONARY ANGIOGRAM;  Surgeon: Larey Dresser, MD;  Location: [facility] CATH LAB;  Service: Cardiovascular;  Laterality: N/A;   Allergies  Allergies  Allergen Reactions  . Bee Venom Anaphylaxis   HPI  65 year old female, older appearing, lifelong smoker with prior medical history of hypertension, PAD  who was recently evaluated for an abdominal hernia. There was an incidental finding of significant coronary calcifications on the CT and she was referred to [place] for further evaluation.  The patient states that she has exertional dyspnea and chest tightness with minimal exertion. As an example she uses this walking one flight of stairs when she has to stop and it resolves within a few minutes. She denies any resting chest pain. Denies any palpitations or syncope. The pain feels pressure-like retrosternal with no radiation. She did have a cardiac cath by Odis Hollingshead many years ago with findings of non-obstructive coronary artery disease at that time.  There was 3.4 cm abdominal aortic aneurysm on her CT that is known for many years. The patient is a lifelong smoker, currently smoking one and one half packs per day. She has a h/o left femoropopliteal bypass by Dr. Donnetta Hutching 15 years ago. She complains of bilateral claudications left worse than right that start after walking minimal distances. She underwent arterial Doppler ultrasound bilaterally and lower extremity angiogram at [facility]. She was talled that she had severe diffuse peripheral arterial disease, however her lesions are distal and her vessels are small for interventions.  The patient had a negative stress test on 07/16/2013, however continuous chest pain. Therefore she underwent  a cardiac catheterization on 07/17/13 wirth findings of non-obstructive CAD. The patient was started on amlodipine for HTN and possible vasospasm. Today she is coming and reports significant improvement of symptoms. No chest pain and improved SOB. She has cut down on smoking cigarettes.  02/22/2016 - the patient is coming after one year, she has noticed that she has no energy, with walking she has been experiencing blurry vision, and dizziness. She is also noticed worsening palpitations that can last several minutes and are associated with lightheadedness and shortness of  breath. She denies any chest pain. She is compliant with her medicines. She is complaining of lower extremity cramping, cold legs and claudications. Denies any syncope. She continues to smoke. Denies orthopnea or paroxysmal nocturnal dyspnea.   Home Medications  Prior to Admission medications   Medication Sig Start Date End Date Taking? Authorizing Provider  atenolol (TENORMIN) 50 MG tablet 50 mg 2 (two) times daily.  06/09/13  Yes Historical Provider, MD  furosemide (LASIX) 40 MG tablet  06/09/13  Yes Historical Provider, MD  LORazepam (ATIVAN) 1 MG tablet  04/22/13  Yes Historical Provider, MD  losartan (COZAAR) 25 MG tablet  05/26/13  Yes Historical Provider, MD  NITROSTAT 0.4 MG SL tablet  06/24/13  Yes Historical Provider, MD  Oxycodone HCl 10 MG TABS  06/08/13  Yes Historical Provider, MD  pantoprazole (PROTONIX) 40 MG tablet  06/09/13  Yes Historical Provider, MD  pravastatin (PRAVACHOL) 40 MG tablet  05/27/13  Yes Historical Provider, MD  PROAIR HFA 108 (90 BASE) MCG/ACT inhaler  06/08/13  Yes Historical Provider, MD  traMADol Veatrice Bourbon) 50 MG tablet  06/14/13  Yes Historical Provider, MD   Family History  Family History  Problem Relation Age of Onset  . Cervical cancer Mother   . Heart disease Mother     MI in 64s  . Liver cancer Father   . Heart disease Father   . Lung cancer Maternal Aunt   . Prostate cancer Maternal Uncle     met. anal  . Breast cancer Maternal Grandmother   . Lung cancer Maternal Aunt   . Lung cancer Maternal Aunt   . Stomach cancer Maternal Aunt   . Colon cancer Maternal Aunt   . Lung cancer Maternal Uncle     Social History  Social History   Social History  . Marital Status: Married    Spouse Name: N/A  . Number of Children: N/A  . Years of Education: N/A   Occupational History  . Not on file.   Social History Main Topics  . Smoking status: Current Every Day Smoker -- 1.00 packs/day    Types: Cigarettes  . Smokeless tobacco: Never Used  .  Alcohol Use: No  . Drug Use: No  . Sexual Activity: Not on file   Other Topics Concern  . Not on file   Social History Narrative     Review of Systems General:  No chills, fever, night sweats or weight changes.  Cardiovascular:  No chest pain, dyspnea on exertion, edema, orthopnea, palpitations, paroxysmal nocturnal dyspnea. Dermatological: No rash, lesions/masses Respiratory: No cough, dyspnea Urologic: No hematuria, dysuria Abdominal:   No nausea, vomiting, diarrhea, bright red blood per rectum, melena, or hematemesis Neurologic:  No visual changes, wkns, changes in mental status. All other systems reviewed and are otherwise negative except as noted above.  Physical Exam  124/72, HR 78, W 158 lbs General: Older appearang, smelling like cigaretes, NAD Psych: Normal affect. Neuro: Alert and oriented  X 3. Moves all extremities spontaneously. HEENT: Normal  Neck: Supple without bruits or JVD. Lungs:  Resp regular and unlabored, wheezes B/L Heart: RRR no s3, s4, or murmurs. Abdomen: Soft, non-tender, non-distended, BS + x 4.  Extremities: No clubbing, cyanosis, no lower extremity edema. Radials 2+ LE cold, no pulses  DP/PT/ bilaterally.  Accessory Clinical Findings  ECG - sinus rhythm, 78 bpm, frequent monomorphic PVCs otherwise normal EKG.  Left heart cath: 07/17/13 Hemodynamics:  AO 150/65  LV 159/17  Coronary angiography:  Coronary dominance: right  Left mainstem: Very mild distal LM tapering.  Left anterior descending (LAD): Heavy proximal LAD calcification. 30-40% stenosis in the proximal LAD at the take-off of a small 2nd diagonal.  Left circumflex (LCx): Medium-sized ramus without signficant disease. AV LCx with 30-40% ostial stenosis. OM1 with 30% ostial stenosis.  Right coronary artery (RCA): There was prominent proximal to mid RCA calcification. 30% proximal, 30% mid RCA stenosis.  Left ventriculography: Not done to conserve contrast. Normal EF on recent stress  Cardiolite.  Final Conclusions: Nonobstructive CAD. I do not have an explanation for her chest pain. No PE or dissection on chest CT. I will keep her overnight to control her blood pressure. Start amlodipine for BP control and in case this could be coronary vasospasm. Will keep on NTG gtt for now while BP is still quite high.  Loralie Champagne  07/17/2013, 4:19 PM     Assessment & Plan  65 year old female lifelong smoker, older appearing  1. Exertional shortness of breath, dizziness and blurred vision, we'll schedule an echocardiogram and dobutamine echocardiogram to rule out ischemia.  2. Known nonobstructive CAD in all 3 vessels In 2014, we'll schedule a dobutamine stress echo as she has change in her symptoms. For now we'll continue aspirin, atorvastatin. She didn't tolerate beta blockers. Again advised on discontinuation of smoking.  3. Chronic CHF with preserved left ventricular ejection fraction - she appears euvolemic.   4. Hypertension - controlled.  5. Palpitations - with new frequent PVCs on the EKG. Known nonobstructive CAD, we will order a stress echo to evaluate for ischemia and also start 48 hour Holter monitor to evaluate for any potential ventricular tachycardias.  5. Hyperlipidemia - on atorvastatin 80 mg daily, with likely cramping, we will check electrolytes including magnesium today and lipids.  6. PAD - severe B/L, however not amenable to intervention, the patient was consulted on smoking cessation, she is not interested. Continue high dose of atorvastatin.  7. Narcotics/anxiolytics addiction - she is explained that we will not prescribe any, she accepts it.  Check lipid profile, CBC, CMP, magnesium today follow-up in 2 months.   Ena Dawley, MD 02/22/2016, 9:09 AM

## 2016-02-23 ENCOUNTER — Ambulatory Visit (INDEPENDENT_AMBULATORY_CARE_PROVIDER_SITE_OTHER): Payer: PPO

## 2016-02-23 ENCOUNTER — Telehealth: Payer: Self-pay | Admitting: *Deleted

## 2016-02-23 DIAGNOSIS — E785 Hyperlipidemia, unspecified: Secondary | ICD-10-CM | POA: Diagnosis not present

## 2016-02-23 DIAGNOSIS — R002 Palpitations: Secondary | ICD-10-CM

## 2016-02-23 DIAGNOSIS — I10 Essential (primary) hypertension: Secondary | ICD-10-CM

## 2016-02-23 DIAGNOSIS — R072 Precordial pain: Secondary | ICD-10-CM | POA: Diagnosis not present

## 2016-02-23 MED ORDER — MAGNESIUM 400 MG PO TABS: 400.0000 mg | ORAL_TABLET | Freq: Every day | ORAL | Status: DC

## 2016-02-23 NOTE — Telephone Encounter (Signed)
Notified the pt that per Dr Meda Coffee, her labs were normal, except her Mg was rather low, and she recommends the pt start taking Magnesium 400 mg po daily, for 2 weeks only.  Confirmed the pharmacy of choice with the pt.  Pt verbalized understanding and agrees with this plan.

## 2016-02-23 NOTE — Telephone Encounter (Signed)
-----   Message from Dorothy Spark, MD sent at 02/23/2016  2:06 PM EDT ----- All labs normal, Mg rather low, we would recommend 400 mg po MgS once daily for the next two weeks.

## 2016-03-15 DIAGNOSIS — M1990 Unspecified osteoarthritis, unspecified site: Secondary | ICD-10-CM | POA: Diagnosis not present

## 2016-03-15 DIAGNOSIS — Z6824 Body mass index (BMI) 24.0-24.9, adult: Secondary | ICD-10-CM | POA: Diagnosis not present

## 2016-03-15 DIAGNOSIS — I739 Peripheral vascular disease, unspecified: Secondary | ICD-10-CM | POA: Diagnosis not present

## 2016-03-15 DIAGNOSIS — G5790 Unspecified mononeuropathy of unspecified lower limb: Secondary | ICD-10-CM | POA: Diagnosis not present

## 2016-03-15 DIAGNOSIS — Z79899 Other long term (current) drug therapy: Secondary | ICD-10-CM | POA: Diagnosis not present

## 2016-03-15 DIAGNOSIS — F1190 Opioid use, unspecified, uncomplicated: Secondary | ICD-10-CM | POA: Diagnosis not present

## 2016-03-19 ENCOUNTER — Telehealth (HOSPITAL_COMMUNITY): Payer: Self-pay | Admitting: *Deleted

## 2016-03-19 NOTE — Telephone Encounter (Signed)
Patient given detailed instructions per Stress Test Requisition Sheet for test on 03/22/16 at 2:30. Patient Notified to arrive 30 minutes early, and that it is imperative to arrive on time for appointment to keep from having the test rescheduled.  Patient verbalized understanding. Veronia Beets

## 2016-03-22 ENCOUNTER — Other Ambulatory Visit: Payer: Self-pay

## 2016-03-22 ENCOUNTER — Ambulatory Visit (HOSPITAL_BASED_OUTPATIENT_CLINIC_OR_DEPARTMENT_OTHER): Payer: PPO

## 2016-03-22 ENCOUNTER — Ambulatory Visit (HOSPITAL_COMMUNITY): Payer: PPO | Attending: Internal Medicine

## 2016-03-22 DIAGNOSIS — R002 Palpitations: Secondary | ICD-10-CM | POA: Diagnosis not present

## 2016-03-22 DIAGNOSIS — J449 Chronic obstructive pulmonary disease, unspecified: Secondary | ICD-10-CM | POA: Diagnosis not present

## 2016-03-22 DIAGNOSIS — I10 Essential (primary) hypertension: Secondary | ICD-10-CM

## 2016-03-22 DIAGNOSIS — I2510 Atherosclerotic heart disease of native coronary artery without angina pectoris: Secondary | ICD-10-CM | POA: Diagnosis not present

## 2016-03-22 DIAGNOSIS — R072 Precordial pain: Secondary | ICD-10-CM

## 2016-03-22 DIAGNOSIS — E785 Hyperlipidemia, unspecified: Secondary | ICD-10-CM | POA: Insufficient documentation

## 2016-03-22 DIAGNOSIS — Z720 Tobacco use: Secondary | ICD-10-CM | POA: Insufficient documentation

## 2016-03-22 DIAGNOSIS — R0989 Other specified symptoms and signs involving the circulatory and respiratory systems: Secondary | ICD-10-CM

## 2016-03-22 DIAGNOSIS — I358 Other nonrheumatic aortic valve disorders: Secondary | ICD-10-CM | POA: Diagnosis not present

## 2016-03-22 DIAGNOSIS — I119 Hypertensive heart disease without heart failure: Secondary | ICD-10-CM | POA: Diagnosis not present

## 2016-03-22 DIAGNOSIS — Z8249 Family history of ischemic heart disease and other diseases of the circulatory system: Secondary | ICD-10-CM | POA: Diagnosis not present

## 2016-03-22 DIAGNOSIS — I340 Nonrheumatic mitral (valve) insufficiency: Secondary | ICD-10-CM | POA: Diagnosis not present

## 2016-03-22 LAB — ECHOCARDIOGRAM COMPLETE
E decel time: 268 msec
E/e' ratio: 9.57
FS: 23 % — AB (ref 28–44)
IVS/LV PW RATIO, ED: 1.55
LA ID, A-P, ES: 39 mm
LA diam end sys: 39 mm
LA diam index: 2.09 cm/m2
LA vol A4C: 29.1 ml
LA vol index: 24.3 mL/m2
LA vol: 45.5 mL
LV E/e' medial: 9.57
LV E/e'average: 9.57
LV PW d: 7.87 mm — AB (ref 0.6–1.1)
LV e' LATERAL: 6.74 cm/s
LVOT SV: 72 mL
LVOT VTI: 18.9 cm
LVOT area: 3.8 cm2
LVOT diameter: 22 mm
LVOT peak vel: 82.8 cm/s
MV Dec: 268
MV pk A vel: 89.7 m/s
MV pk E vel: 64.5 m/s
TAPSE: 15.9 mm
TDI e' lateral: 6.74
TDI e' medial: 5.11

## 2016-03-22 MED ORDER — DOBUTAMINE INFUSION FOR EP/ECHO/NUC (1000 MCG/ML): 30.0000 ug/kg/min | INTRAVENOUS | Status: AC

## 2016-03-26 ENCOUNTER — Encounter: Payer: Self-pay | Admitting: Internal Medicine

## 2016-03-26 ENCOUNTER — Ambulatory Visit (INDEPENDENT_AMBULATORY_CARE_PROVIDER_SITE_OTHER): Payer: PPO | Admitting: Internal Medicine

## 2016-03-26 VITALS — BP 128/44 | HR 90 | Ht 69.0 in | Wt 155.2 lb

## 2016-03-26 DIAGNOSIS — Z720 Tobacco use: Secondary | ICD-10-CM | POA: Diagnosis not present

## 2016-03-26 DIAGNOSIS — I493 Ventricular premature depolarization: Secondary | ICD-10-CM | POA: Diagnosis not present

## 2016-03-26 DIAGNOSIS — I10 Essential (primary) hypertension: Secondary | ICD-10-CM

## 2016-03-26 MED ORDER — VERAPAMIL HCL ER 120 MG PO TBCR: 120.0000 mg | EXTENDED_RELEASE_TABLET | Freq: Every day | ORAL | Status: DC

## 2016-03-26 MED ORDER — FUROSEMIDE 20 MG PO TABS: 20.0000 mg | ORAL_TABLET | Freq: Every day | ORAL | Status: DC

## 2016-03-26 NOTE — Progress Notes (Signed)
Electrophysiology Office Note   Date:  [id number]   ID:  [name], [name] [id number], MRN [id number]  PCP:  Leonides Sake, MD  Cardiologist:  Dr Meda Coffee Primary Electrophysiologist: Thompson Grayer, MD    Chief Complaint  Patient presents with  . PVCs     History of Present Illness: Marie Johnson is a 65 y.o. female who presents today for electrophysiology evaluation.   She has a h/o heavy ongoing tobacco use with COPD, PVD, and CAD.  She reports having occasional palpitations for about a month.  These are more likely to occur in the evening.  She denies issues prior to this.  She has chronic difficulty with SOB.  She finds that she has occasional dizziness.  Frequently, this is postural in nature.  She has rare heart "twinges" but no sustained chest pain. Her primary concern is with leg pain and "poor circulation".  She smokes at least a pack per day.  She is clear that she is not interested in smoking cessation.  She has fatigue and "no energy".  Today, she denies symptoms of orthopnea, PND, lower extremity edema, claudication, syncope, bleeding, or neurologic sequela. The patient is tolerating medications without difficulties and is otherwise without complaint today.    Past Medical History  Diagnosis Date  . Arthritis   . Asthma   . GERD (gastroesophageal reflux disease)   . COPD (chronic obstructive pulmonary disease) ([place])   . Hyperlipidemia   . Hypertension   . Osteoporosis   . PAD (peripheral artery disease) ([place])     a. s/p L fem-pop bypass b. recent evaluation at [facility], unamenable to intervention  . CHD (congenital heart disease)     pt unaware???  . CAD (coronary artery disease)     a. Nonobstructive CAD by cath 07/17/13.  [name] PUD (peptic ulcer disease)   . Aortic atherosclerosis ([place])   . AAA (abdominal aortic aneurysm) ([facility])     3.4 cm  . Chest pain     a. Adm 10-07/2013 - CTA neg for PE/dissection, cath with nonobstructive disease, CP ?due to uncontrolled HTN  vs coronary vasospasm.  [name] Anxiety   . Tobacco abuse   . Premature ventricular contraction   . Neuropathy [facility])    Past Surgical History  Procedure Laterality Date  . Vein bypass surgery      left leg  . Rotator cuff repair      right side, had torn ligaments as well  . Wrist arthrocentesis      left - broke in 3 places so has bars and screws placed  . Hip surgery      left - bars and screws placed  . Abdominal hysterectomy    . Tubal ligation    . Cardiac catheterization      several years ago, nonobstructive, "50-60% blockages"  . Left heart catheterization with coronary angiogram N/A 07/17/2013    Procedure: LEFT HEART CATHETERIZATION WITH CORONARY ANGIOGRAM;  Surgeon: Larey Dresser, MD;  Location: [facility] CATH LAB;  Service: Cardiovascular;  Laterality: N/A;     Current Outpatient Prescriptions  Medication Sig Dispense Refill  . albuterol (PROVENTIL HFA;VENTOLIN HFA) 108 (90 BASE) MCG/ACT inhaler Inhale 2 puffs into the lungs every 6 (six) hours as needed for wheezing.    [name] alendronate (FOSAMAX) 70 MG tablet Take 70 mg by mouth once a week.    [name] allopurinol (ZYLOPRIM) 100 MG tablet Take 100 mg by mouth daily.    [name] amLODipine (NORVASC) 2.5 MG  tablet Take 1 tablet (2.5 mg total) by mouth daily. 90 tablet 6  . aspirin 325 MG tablet Take 325 mg by mouth daily.    [name] atorvastatin (LIPITOR) 80 MG tablet Take 1 tablet by mouth daily.  0  . EPIPEN 2-PAK 0.3 MG/0.3ML SOAJ injection Use as directed as needed for allergies.    . furosemide (LASIX) 40 MG tablet Take 1 tablet (40 mg total) by mouth daily. 90 tablet 2  . Magnesium 400 MG TABS Take 400 mg by mouth daily. 14 tablet 0  . nitroGLYCERIN (NITROSTAT) 0.4 MG SL tablet Place 0.4 mg under the tongue every 5 (five) minutes x 3 doses as needed for chest pain.     . Oxycodone HCl 10 MG TABS Take 10 mg by mouth 3 (three) times daily.     . pantoprazole (PROTONIX) 40 MG tablet Take 40 mg by mouth 2 (two) times daily.    . potassium chloride  SA (K-DUR,KLOR-CON) 20 MEQ tablet Take 1 tablet (20 mEq total) by mouth daily. 90 tablet 3  . spironolactone (ALDACTONE) 25 MG tablet TAKE ONE TABLET BY MOUTH ONCE DAILY 30 tablet 0  . traMADol (ULTRAM) 50 MG tablet Take 50 mg by mouth every 6 (six) hours as needed for pain.     No current facility-administered medications for this visit.   Facility-Administered Medications Ordered in Other Visits  Medication Dose Route Frequency Provider Last Rate Last Dose  . DOBUTamine (DOBUTREX) 1,000 mcg/mL in dextrose 5% 250 mL infusion  30 mcg/kg/min Intravenous Titrated Dorothy Spark, MD   30 mcg/kg/min at 03/22/16 1430    Allergies:   Bee venom   Social History:  The patient  reports that she has been smoking Cigarettes.  She has been smoking about 1.00 pack per day. She has never used smokeless tobacco. She reports that she does not drink alcohol or use illicit drugs.   Family History:  The patient's  family history includes Breast cancer in her maternal grandmother; Cervical cancer in her mother; Colon cancer in her maternal aunt; Heart disease in her father and mother; Liver cancer in her father; Lung cancer in her maternal aunt, maternal aunt, maternal aunt, and maternal uncle; Prostate cancer in her maternal uncle; Stomach cancer in her maternal aunt.    ROS:  Please see the history of present illness.   All other systems are reviewed and negative.    PHYSICAL EXAM: VS:  BP 128/44 mmHg  Pulse 90  Ht 5\' 9"  (1.753 m)  Wt 155 lb 3.2 oz (70.398 kg)  BMI 22.91 kg/m2 , BMI Body mass index is 22.91 kg/(m^2). GEN: Well nourished, well developed, in no acute distress HEENT: very dry MM Neck: no JVD, carotid bruits, or masses Cardiac: RRR; no murmurs, rubs, or gallops,trace L leg edema with clear venous insufficiency Respiratory:  clear to auscultation bilaterally, normal work of breathing GI: soft, nontender, nondistended, + BS MS: bilateral leg atrophy Skin: warm and dry  Neuro:  Strength  and sensation are intact Psych: euthymic mood, full affect  EKG:  EKG is ordered today. The ekg ordered today shows sinus rhythm 90 bpm, PR 158 msec, she has LBB inferior axis PVCs with transition at V3   Recent Labs: 02/22/2016: ALT 11; BUN 21; Creat 0.83; Hemoglobin 13.7; Magnesium 1.8; Platelets 264; Potassium 4.4; Sodium 138; TSH 0.49    Lipid Panel     Component Value Date/Time   CHOL 154 02/22/2016 1007   TRIG 82 02/22/2016 1007  HDL 51 02/22/2016 1007   CHOLHDL 3.0 02/22/2016 1007   VLDL 16 02/22/2016 1007   LDLCALC 87 02/22/2016 1007     Wt Readings from Last 3 Encounters:  03/26/16 155 lb 3.2 oz (70.398 kg)  02/22/16 158 lb 12.8 oz (72.031 kg)  02/21/15 157 lb (71.215 kg)      Other studies Reviewed: Additional studies/ records that were reviewed today include: Dr Thera Flake notes, event monitor, echo, dobutamine echo  Review of the above records today demonstrates: preserved EF, high PVC burden   ASSESSMENT AND PLAN:  1.  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  I am not convinced that these are the cause of her symptoms.  I think that her dizziness is due to dehydration and her SOB due to chronic lung disease and ongoing tobacco.  Given her multiple medical issues with ongoing tobacco use I think that her risks of ablation are increased.  We should avoid EP procedures if possible. I will start verapamil 120mg  daily today which can be uptitrated if needed.  Avoid beta blockers currently given lung disease.  2. Dizziness Appears dry on exam She is using lasix to treat venous insufficiency.  EF is preserved. 2 gram sodium diet and support hose with leg elevation are encouraged.  She is very reluctant to reduce or stop lasix. Hold lasix x 3 days.then reduce to 20mg  daily thereafter  3. Venous insufficiency  As above  4. htn Stable No change required today  5. Tobacco She has difficult vasculopathy, COPD and multiple sequelae from tobacco.  Unfortunately she  is not ready to quit and seems to have very poor insight into tobacco's harmful effects on her body   Follow-up with EP NP in 4 weeks, follow-up with Dr Meda Coffee as scheduled I will see when needed   Signed, Thompson Grayer, MD  03/26/2016 10:51 AM     [place] [address] [phone] (office) [phone] (fax)

## 2016-03-26 NOTE — Patient Instructions (Addendum)
Your physician has recommended you make the following change in your medication:  1.) hold lasix for 3 days, after that--start lasix 20 mg daily (lower dose) 2.) start verapamil (Calan-SR) 120 mg daily Your physician recommends that you schedule a follow-up appointment in: 4 weeks with Chanetta Marshall, NP.  Low-Sodium Eating Plan Sodium raises blood pressure and causes water to be held in the body. Getting less sodium from food will help lower your blood pressure, reduce any swelling, and protect your heart, liver, and kidneys. We get sodium by adding salt (sodium chloride) to food. Most of our sodium comes from canned, boxed, and frozen foods. Restaurant foods, fast foods, and pizza are also very high in sodium. Even if you take medicine to lower your blood pressure or to reduce fluid in your body, getting less sodium from your food is important. WHAT IS MY PLAN? Most people should limit their sodium intake to 2,300 mg a day. Your health care provider recommends that you limit your sodium intake to __2000 mg__ a day.  WHAT DO I NEED TO KNOW ABOUT THIS EATING PLAN? For the low-sodium eating plan, you will follow these general guidelines:  Choose foods with a % Daily Value for sodium of less than 5% (as listed on the food label).   Use salt-free seasonings or herbs instead of table salt or sea salt.   Check with your health care provider or pharmacist before using salt substitutes.   Eat fresh foods.  Eat more vegetables and fruits.  Limit canned vegetables. If you do use them, rinse them well to decrease the sodium.   Limit cheese to 1 oz (28 g) per day.   Eat lower-sodium products, often labeled as "lower sodium" or "no salt added."  Avoid foods that contain monosodium glutamate (MSG). MSG is sometimes added to [place] food and some canned foods.  Check food labels (Nutrition Facts labels) on foods to learn how much sodium is in one serving.  Eat more home-cooked food and less  restaurant, buffet, and fast food.  When eating at a restaurant, ask that your food be prepared with less salt, or no salt if possible.  HOW DO I READ FOOD LABELS FOR SODIUM INFORMATION? The Nutrition Facts label lists the amount of sodium in one serving of the food. If you eat more than one serving, you must multiply the listed amount of sodium by the number of servings. Food labels may also identify foods as:  Sodium free--Less than 5 mg in a serving.  Very low sodium--35 mg or less in a serving.  Low sodium--140 mg or less in a serving.  Light in sodium--50% less sodium in a serving. For example, if a food that usually has 300 mg of sodium is changed to become light in sodium, it will have 150 mg of sodium.  Reduced sodium--25% less sodium in a serving. For example, if a food that usually has 400 mg of sodium is changed to reduced sodium, it will have 300 mg of sodium. WHAT FOODS CAN I EAT? Grains Low-sodium cereals, including oats, puffed wheat and rice, and shredded wheat cereals. Low-sodium crackers. Unsalted rice and pasta. Lower-sodium bread.  Vegetables Frozen or fresh vegetables. Low-sodium or reduced-sodium canned vegetables. Low-sodium or reduced-sodium tomato sauce and paste. Low-sodium or reduced-sodium tomato and vegetable juices.  Fruits Fresh, frozen, and canned fruit. Fruit juice.  Meat and Other Protein Products Low-sodium canned tuna and salmon. Fresh or frozen meat, poultry, seafood, and fish. Lamb. Unsalted nuts. Dried  beans, peas, and lentils without added salt. Unsalted canned beans. Homemade soups without salt. Eggs.  Dairy Milk. Soy milk. Ricotta cheese. Low-sodium or reduced-sodium cheeses. Yogurt.  Condiments Fresh and dried herbs and spices. Salt-free seasonings. Onion and garlic powders. Low-sodium varieties of mustard and ketchup. Fresh or refrigerated horseradish. Lemon juice.  Fats and Oils Reduced-sodium salad dressings. Unsalted butter.   Other Unsalted popcorn and pretzels.  The items listed above may not be a complete list of recommended foods or beverages. Contact your dietitian for more options. WHAT FOODS ARE NOT RECOMMENDED? Grains Instant hot cereals. Bread stuffing, pancake, and biscuit mixes. Croutons. Seasoned rice or pasta mixes. Noodle soup cups. Boxed or frozen macaroni and cheese. Self-rising flour. Regular salted crackers. Vegetables Regular canned vegetables. Regular canned tomato sauce and paste. Regular tomato and vegetable juices. Frozen vegetables in sauces. Salted [place] fries. Olives. Angie Fava. Relishes. Sauerkraut. Salsa. Meat and Other Protein Products Salted, canned, smoked, spiced, or pickled meats, seafood, or fish. Bacon, ham, sausage, hot dogs, corned beef, chipped beef, and packaged luncheon meats. Salt pork. Jerky. Pickled herring. Anchovies, regular canned tuna, and sardines. Salted nuts. Dairy Processed cheese and cheese spreads. Cheese curds. Blue cheese and cottage cheese. Buttermilk.  Condiments Onion and garlic salt, seasoned salt, table salt, and sea salt. Canned and packaged gravies. Worcestershire sauce. Tartar sauce. Barbecue sauce. Teriyaki sauce. Soy sauce, including reduced sodium. Steak sauce. Fish sauce. Oyster sauce. Cocktail sauce. Horseradish that you find on the shelf. Regular ketchup and mustard. Meat flavorings and tenderizers. Bouillon cubes. Hot sauce. Tabasco sauce. Marinades. Taco seasonings. Relishes. Fats and Oils Regular salad dressings. Salted butter. Margarine. Ghee. Bacon fat.  Other Potato and tortilla chips. Corn chips and puffs. Salted popcorn and pretzels. Canned or dried soups. Pizza. Frozen entrees and pot pies.  The items listed above may not be a complete list of foods and beverages to avoid. Contact your dietitian for more information.   This information is not intended to replace advice given to you by your health care provider. Make sure you  discuss any questions you have with your health care provider.   Document Released: 02/23/2002 Document Revised: 09/24/2014 Document Reviewed: 07/08/2013 Elsevier Interactive Patient Education [employer].

## 2016-04-25 NOTE — Progress Notes (Signed)
Electrophysiology Office Note Date: 5/32/9924  ID:  Garielle, Mroz [birth year], MRN [id number]  PCP: Leonides Sake, MD Primary Cardiologist: Meda Coffee Electrophysiologist: Allred  CC: PVC follow up  Marie Johnson is a 65 y.o. female seen today for Dr Rayann Heman.  She presents today for routine electrophysiology followup.  Since last being seen in our clinic, the patient reports doing relatively well.  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  She has chronic and stable shortness of breath with exertion. She also complains of leg pain at night and chronic LE edema.  She denies chest pain, PND, orthopnea, nausea, vomiting, dizziness, syncope, edema, weight gain, or early satiety.  Past Medical History:  Diagnosis Date  . AAA (abdominal aortic aneurysm) (HCC)    3.4 cm  . Anxiety   . Aortic atherosclerosis ([place])   . Arthritis   . Asthma   . CAD (coronary artery disease)    a. Nonobstructive CAD by cath 07/17/13.  . CHD (congenital heart disease)    pt unaware???  . Chest pain    a. Adm 10-07/2013 - CTA neg for PE/dissection, cath with nonobstructive disease, CP ?due to uncontrolled HTN vs coronary vasospasm.  [name] COPD (chronic obstructive pulmonary disease) ([place])   . GERD (gastroesophageal reflux disease)   . Hyperlipidemia   . Hypertension   . Neuropathy ([place])   . Osteoporosis   . PAD (peripheral artery disease) ([place])    a. s/p L fem-pop bypass b. recent evaluation at [facility], unamenable to intervention  . Premature ventricular contraction   . PUD (peptic ulcer disease)   . Tobacco abuse    Past Surgical History:  Procedure Laterality Date  . ABDOMINAL HYSTERECTOMY    . CARDIAC CATHETERIZATION     several years ago, nonobstructive, "50-60% blockages"  . HIP SURGERY     left - bars and screws placed  . LEFT HEART CATHETERIZATION WITH CORONARY ANGIOGRAM N/A 07/17/2013   Procedure: LEFT HEART CATHETERIZATION WITH CORONARY ANGIOGRAM;  Surgeon: Larey Dresser, MD;   Location: [facility] CATH LAB;  Service: Cardiovascular;  Laterality: N/A;  . ROTATOR CUFF REPAIR     right side, had torn ligaments as well  . TUBAL LIGATION    . VEIN BYPASS SURGERY     left leg  . WRIST ARTHROCENTESIS     left - broke in 3 places so has bars and screws placed    Current Outpatient Prescriptions  Medication Sig Dispense Refill  . albuterol (PROVENTIL HFA;VENTOLIN HFA) 108 (90 BASE) MCG/ACT inhaler Inhale 2 puffs into the lungs every 6 (six) hours as needed for wheezing.    [name] alendronate (FOSAMAX) 70 MG tablet Take 70 mg by mouth once a week.    [name] allopurinol (ZYLOPRIM) 100 MG tablet Take 100 mg by mouth daily.    [name] amLODipine (NORVASC) 2.5 MG tablet Take 1 tablet (2.5 mg total) by mouth daily. 90 tablet 6  . aspirin 325 MG tablet Take 325 mg by mouth daily.    [name] atorvastatin (LIPITOR) 80 MG tablet Take 1 tablet by mouth daily.  0  . EPIPEN 2-PAK 0.3 MG/0.3ML SOAJ injection Use as directed as needed for allergies.    . furosemide (LASIX) 20 MG tablet Take 1 tablet (20 mg total) by mouth daily. 90 tablet 3  . Oxycodone HCl 10 MG TABS Take 10 mg by mouth 3 (three) times daily.     . pantoprazole (PROTONIX) 40 MG tablet Take 40 mg by mouth 2 (  two) times daily.    . potassium chloride SA (K-DUR,KLOR-CON) 20 MEQ tablet Take 1 tablet (20 mEq total) by mouth daily. 90 tablet 3  . spironolactone (ALDACTONE) 25 MG tablet TAKE ONE TABLET BY MOUTH ONCE DAILY 30 tablet 0  . traMADol (ULTRAM) 50 MG tablet Take 50 mg by mouth every 6 (six) hours as needed for pain.    . verapamil (CALAN-SR) 180 MG CR tablet Take 1 tablet (180 mg total) by mouth at bedtime. 90 tablet 3  . nitroGLYCERIN (NITROSTAT) 0.4 MG SL tablet Place 1 tablet (0.4 mg total) under the tongue every 5 (five) minutes x 3 doses as needed for chest pain. 25 tablet 4   No current facility-administered medications for this visit.    Facility-Administered Medications Ordered in Other Visits  Medication Dose Route Frequency  Provider Last Rate Last Dose  . DOBUTamine (DOBUTREX) 1,000 mcg/mL in dextrose 5% 250 mL infusion  30 mcg/kg/min Intravenous Titrated Dorothy Spark, MD        Allergies:   Bee venom   Social History: Social History   Social History  . Marital status: Married    Spouse name: N/A  . Number of children: N/A  . Years of education: N/A   Occupational History  . Not on file.   Social History Main Topics  . Smoking status: Current Every Day Smoker    Packs/day: 1.00    Types: Cigarettes  . Smokeless tobacco: Never Used  . Alcohol use No  . Drug use: No  . Sexual activity: Not on file   Other Topics Concern  . Not on file   Social History Narrative   Pt lives in [place] with husband.  Retired Educational psychologist    Family History: Family History  Problem Relation Age of Onset  . Cervical cancer Mother   . Heart disease Mother     MI in 25s  . Liver cancer Father   . Heart disease Father   . Lung cancer Maternal Aunt   . Prostate cancer Maternal Uncle     met. anal  . Breast cancer Maternal Grandmother   . Lung cancer Maternal Aunt   . Lung cancer Maternal Aunt   . Stomach cancer Maternal Aunt   . Colon cancer Maternal Aunt   . Lung cancer Maternal Uncle     Review of Systems: All other systems reviewed and are otherwise negative except as noted above.   Physical Exam: VS:  BP 132/68   Pulse 77   Ht 5' 9" (1.753 m)   Wt 156 lb 9.6 oz (71 kg)   SpO2 95%   BMI 23.13 kg/m  , BMI Body mass index is 23.13 kg/m. Wt Readings from Last 3 Encounters:  04/26/16 156 lb 9.6 oz (71 kg)  03/26/16 155 lb 3.2 oz (70.4 kg)  02/22/16 158 lb 12.8 oz (72 kg)    GEN- The patient is elderly and chronically ill appearing, alert and oriented x 3 today, +strong tobacco smell   HEENT: normocephalic, atraumatic; sclera clear, conjunctiva pink; hearing intact; oropharynx clear; neck supple  Lungs- Clear to ausculation bilaterally, normal work of breathing.  No wheezes, rales,  rhonchi Heart- Regular rate and rhythm  GI- soft, non-tender, non-distended, bowel sounds present, no hepatosplenomegaly Extremities- no clubbing, cyanosis, +BLE dependent edema  MS- no significant deformity or atrophy Skin- warm and dry, no rash or lesion  Psych- euthymic mood, full affect Neuro- strength and sensation are intact   EKG:  EKG  is ordered today. The ekg ordered today shows sinus rhythm, 1 PVC   Recent Labs: 02/22/2016: ALT 11; BUN 21; Creat 0.83; Hemoglobin 13.7; Magnesium 1.8; Platelets 264; Potassium 4.4; Sodium 138; TSH 0.49    Other studies Reviewed: Additional studies/ records that were reviewed today include: Dr Jackalyn Lombard office notes  Assessment and Plan: 1.  PVC's Improved with Verapamil but still with some sense of PVC's Increase Verapamil to 139m daily today  With ongoing tobacco abuse and chronic long disease, procedural risks are increased.    2.  HTN Stable No change required today  3.  Tobacco abuse Not currently willing to quit    Current medicines are reviewed at length with the patient today.   The patient does not have concerns regarding her medicines.  The following changes were made today:  none  Labs/ tests ordered today include: none Orders Placed This Encounter  Procedures  . EKG 12-Lead     Disposition:   Follow up with Dr NMeda Coffeeas scheduled, EP as needed.   Signed, AChanetta Marshall NP 04/26/2016 8:25 AM   [facility] [address]ound [place] [phone](office) ([phone](fax)

## 2016-04-26 ENCOUNTER — Encounter: Payer: Self-pay | Admitting: Nurse Practitioner

## 2016-04-26 ENCOUNTER — Ambulatory Visit (INDEPENDENT_AMBULATORY_CARE_PROVIDER_SITE_OTHER): Payer: PPO | Admitting: Nurse Practitioner

## 2016-04-26 VITALS — BP 132/68 | HR 77 | Ht 69.0 in | Wt 156.6 lb

## 2016-04-26 DIAGNOSIS — I10 Essential (primary) hypertension: Secondary | ICD-10-CM

## 2016-04-26 DIAGNOSIS — Z720 Tobacco use: Secondary | ICD-10-CM

## 2016-04-26 DIAGNOSIS — I493 Ventricular premature depolarization: Secondary | ICD-10-CM

## 2016-04-26 MED ORDER — VERAPAMIL HCL ER 180 MG PO TBCR: 180.0000 mg | EXTENDED_RELEASE_TABLET | Freq: Every day | ORAL | 3 refills | Status: DC

## 2016-04-26 MED ORDER — NITROGLYCERIN 0.4 MG SL SUBL
0.4000 mg | SUBLINGUAL_TABLET | SUBLINGUAL | 4 refills | Status: DC | PRN
Start: 2016-04-26 — End: 2024-07-21

## 2016-04-26 NOTE — Patient Instructions (Addendum)
Medication Instructions: Your physician has recommended you make the following change in your medication:   1. INCREASE Verapamil to 180 mg by mouth at bedtime - new RX sent   Labwork: NONE  Procedures/Testing: NONE  Follow-Up: Your physician recommends that you keep your scheduled appointment with Dr. Meda Coffee 05/23/16 at 9:15 am. You may follow up with EP as needed.    Any Additional Special Instructions Will Be Listed Below (If Applicable).     If you need a refill on your cardiac medications before your next appointment, please call your pharmacy.

## 2016-05-23 ENCOUNTER — Encounter (INDEPENDENT_AMBULATORY_CARE_PROVIDER_SITE_OTHER): Payer: Self-pay

## 2016-05-23 ENCOUNTER — Encounter: Payer: Self-pay | Admitting: Cardiology

## 2016-05-23 ENCOUNTER — Other Ambulatory Visit: Payer: Self-pay | Admitting: Cardiology

## 2016-05-23 ENCOUNTER — Ambulatory Visit (INDEPENDENT_AMBULATORY_CARE_PROVIDER_SITE_OTHER): Payer: PPO | Admitting: Cardiology

## 2016-05-23 VITALS — BP 136/92 | HR 80 | Ht 69.0 in | Wt 153.0 lb

## 2016-05-23 DIAGNOSIS — I739 Peripheral vascular disease, unspecified: Secondary | ICD-10-CM

## 2016-05-23 DIAGNOSIS — F17200 Nicotine dependence, unspecified, uncomplicated: Secondary | ICD-10-CM

## 2016-05-23 DIAGNOSIS — I493 Ventricular premature depolarization: Secondary | ICD-10-CM | POA: Diagnosis not present

## 2016-05-23 DIAGNOSIS — I10 Essential (primary) hypertension: Secondary | ICD-10-CM

## 2016-05-23 DIAGNOSIS — E785 Hyperlipidemia, unspecified: Secondary | ICD-10-CM | POA: Diagnosis not present

## 2016-05-23 DIAGNOSIS — Z720 Tobacco use: Secondary | ICD-10-CM

## 2016-05-23 DIAGNOSIS — I5032 Chronic diastolic (congestive) heart failure: Secondary | ICD-10-CM

## 2016-05-23 NOTE — Progress Notes (Signed)
Patient ID: Marie Johnson, female   DOB: 1950/12/04, 65 y.o.   MRN: [id number]    Patient Name: Marie Johnson Date of Encounter: 05/23/2016  Primary Care Provider:  Leonides Sake, MD Primary Cardiologist:  Ena Dawley  Chief complain: 6 months follow up for chest pain and palpitations  Problem List   Past Medical History:  Diagnosis Date  . AAA (abdominal aortic aneurysm) (HCC)    3.4 cm  . Anxiety   . Aortic atherosclerosis ([place])   . Arthritis   . Asthma   . CAD (coronary artery disease)    a. Nonobstructive CAD by cath 07/17/13.  . CHD (congenital heart disease)    pt unaware???  . Chest pain    a. Adm 10-07/2013 - CTA neg for PE/dissection, cath with nonobstructive disease, CP ?due to uncontrolled HTN vs coronary vasospasm.  [name] COPD (chronic obstructive pulmonary disease) ([place])   . GERD (gastroesophageal reflux disease)   . Hyperlipidemia   . Hypertension   . Neuropathy ([place])   . Osteoporosis   . PAD (peripheral artery disease) ([place])    a. s/p L fem-pop bypass b. recent evaluation at [facility], unamenable to intervention  . Premature ventricular contraction   . PUD (peptic ulcer disease)   . Tobacco abuse    Past Surgical History:  Procedure Laterality Date  . ABDOMINAL HYSTERECTOMY    . CARDIAC CATHETERIZATION     several years ago, nonobstructive, "50-60% blockages"  . HIP SURGERY     left - bars and screws placed  . LEFT HEART CATHETERIZATION WITH CORONARY ANGIOGRAM N/A 07/17/2013   Procedure: LEFT HEART CATHETERIZATION WITH CORONARY ANGIOGRAM;  Surgeon: Larey Dresser, MD;  Location: [facility] CATH LAB;  Service: Cardiovascular;  Laterality: N/A;  . ROTATOR CUFF REPAIR     right side, had torn ligaments as well  . TUBAL LIGATION    . VEIN BYPASS SURGERY     left leg  . WRIST ARTHROCENTESIS     left - broke in 3 places so has bars and screws placed   Allergies  Allergies  Allergen Reactions  . Bee Venom Anaphylaxis   HPI  65 year old female, older  appearing, lifelong smoker with prior medical history of hypertension, PAD who was recently evaluated for an abdominal hernia. There was an incidental finding of significant coronary calcifications on the CT and she was referred to [place] for further evaluation.  The patient states that she has exertional dyspnea and chest tightness with minimal exertion. As an example she uses this walking one flight of stairs when she has to stop and it resolves within a few minutes. She denies any resting chest pain. Denies any palpitations or syncope. The pain feels pressure-like retrosternal with no radiation. She did have a cardiac cath by Odis Hollingshead many years ago with findings of non-obstructive coronary artery disease at that time.  There was 3.4 cm abdominal aortic aneurysm on her CT that is known for many years. The patient is a lifelong smoker, currently smoking one and one half packs per day. She has a h/o left femoropopliteal bypass by Dr. Donnetta Hutching 15 years ago. She complains of bilateral claudications left worse than right that start after walking minimal distances. She underwent arterial Doppler ultrasound bilaterally and lower extremity angiogram at [facility]. She was talled that she had severe diffuse peripheral arterial disease, however her lesions are distal and her vessels are small for interventions.  The patient had a negative stress test on 07/16/2013, however continuous  chest pain. Therefore she underwent a cardiac catheterization on 07/17/13 wirth findings of non-obstructive CAD. The patient was started on amlodipine for HTN and possible vasospasm. Today she is coming and reports significant improvement of symptoms. No chest pain and improved SOB. She has cut down on smoking cigarettes.  02/22/2016 - the patient is coming after one year, she has noticed that she has no energy, with walking she has been experiencing blurry vision, and dizziness. She is also noticed worsening palpitations that can  last several minutes and are associated with lightheadedness and shortness of breath. She denies any chest pain. She is compliant with her medicines. She is complaining of lower extremity cramping, cold legs and claudications. Denies any syncope. She continues to smoke. Denies orthopnea or paroxysmal nocturnal dyspnea.  05/23/2016, this is a 3 months follow-up, in the meantime the patient was seen by Dr. Rayann Heman. She underwent 48 hour Holter monitoring that showed 41,000 PVCs she was started on verapamil with good response with decreased amount of PVCs and improved symptoms. She is not considered an ablation candidate because of ongoing smoking and severe COPD. She states that she overall feels better but has severe constipation secondary to verapamil and claims that oxycodone cannot do that to her as she has been taking it for a while. She denies any presyncope or syncope. No chest pain, stable dyspnea on exertion that she attributes to COPD. She has developed significant claudications they're currently at rest and worse at night. No lower extremity edema.  Home Medications  Prior to Admission medications   Medication Sig Start Date End Date Taking? Authorizing Provider  atenolol (TENORMIN) 50 MG tablet 50 mg 2 (two) times daily.  06/09/13  Yes Historical Provider, MD  furosemide (LASIX) 40 MG tablet  06/09/13  Yes Historical Provider, MD  LORazepam (ATIVAN) 1 MG tablet  04/22/13  Yes Historical Provider, MD  losartan (COZAAR) 25 MG tablet  05/26/13  Yes Historical Provider, MD  NITROSTAT 0.4 MG SL tablet  06/24/13  Yes Historical Provider, MD  Oxycodone HCl 10 MG TABS  06/08/13  Yes Historical Provider, MD  pantoprazole (PROTONIX) 40 MG tablet  06/09/13  Yes Historical Provider, MD  pravastatin (PRAVACHOL) 40 MG tablet  05/27/13  Yes Historical Provider, MD  PROAIR HFA 108 (90 BASE) MCG/ACT inhaler  06/08/13  Yes Historical Provider, MD  traMADol Veatrice Bourbon) 50 MG tablet  06/14/13  Yes Historical Provider, MD    Family History  Family History  Problem Relation Age of Onset  . Cervical cancer Mother   . Heart disease Mother     MI in 49s  . Liver cancer Father   . Heart disease Father   . Lung cancer Maternal Aunt   . Prostate cancer Maternal Uncle     met. anal  . Breast cancer Maternal Grandmother   . Lung cancer Maternal Aunt   . Lung cancer Maternal Aunt   . Stomach cancer Maternal Aunt   . Colon cancer Maternal Aunt   . Lung cancer Maternal Uncle     Social History  Social History   Social History  . Marital status: Married    Spouse name: N/A  . Number of children: N/A  . Years of education: N/A   Occupational History  . Not on file.   Social History Main Topics  . Smoking status: Current Every Day Smoker    Packs/day: 1.00    Types: Cigarettes  . Smokeless tobacco: Never Used  . Alcohol use No  .  Drug use: No  . Sexual activity: Not on file   Other Topics Concern  . Not on file   Social History Narrative   Pt lives in [place] with husband.  Retired Educational psychologist     Review of Systems General:  No chills, fever, night sweats or weight changes.  Cardiovascular:  No chest pain, dyspnea on exertion, edema, orthopnea, palpitations, paroxysmal nocturnal dyspnea. Dermatological: No rash, lesions/masses Respiratory: No cough, dyspnea Urologic: No hematuria, dysuria Abdominal:   No nausea, vomiting, diarrhea, bright red blood per rectum, melena, or hematemesis Neurologic:  No visual changes, wkns, changes in mental status. All other systems reviewed and are otherwise negative except as noted above.  Physical Exam  124/72, HR 78, W 158 lbs General: Older appearang, smelling like cigaretes, NAD Psych: Normal affect. Neuro: Alert and oriented X 3. Moves all extremities spontaneously. HEENT: Normal  Neck: Supple without bruits or JVD. Lungs:  Resp regular and unlabored, wheezes B/L Heart: RRR no s3, s4, or murmurs. Abdomen: Soft, non-tender, non-distended, BS +  x 4.  Extremities: No clubbing, cyanosis, no lower extremity edema. Radials 2+ LE cold, no pulses  DP/PT/ bilaterally.  Accessory Clinical Findings  ECG - sinus rhythm, 78 bpm, frequent monomorphic PVCs otherwise normal EKG.  Left heart cath: 07/17/13 Hemodynamics:  AO 150/65  LV 159/17  Coronary angiography:  Coronary dominance: right  Left mainstem: Very mild distal LM tapering.  Left anterior descending (LAD): Heavy proximal LAD calcification. 30-40% stenosis in the proximal LAD at the take-off of a small 2nd diagonal.  Left circumflex (LCx): Medium-sized ramus without signficant disease. AV LCx with 30-40% ostial stenosis. OM1 with 30% ostial stenosis.  Right coronary artery (RCA): There was prominent proximal to mid RCA calcification. 30% proximal, 30% mid RCA stenosis.  Left ventriculography: Not done to conserve contrast. Normal EF on recent stress Cardiolite.  Final Conclusions: Nonobstructive CAD. I do not have an explanation for her chest pain. No PE or dissection on chest CT. I will keep her overnight to control her blood pressure. Start amlodipine for BP control and in case this could be coronary vasospasm. Will keep on NTG gtt for now while BP is still quite high.  Loralie Champagne  07/17/2013, 4:19 PM     Assessment & Plan  65 year old female lifelong smoker, older appearing  1. Frequent PVCs - 41,048 hour Holter monitor, high risk patient for ablation secondary to severe COPD. Response to verapamil, she is advised to increase her fluid intake and consider other laxatives as verapamil is a good option for her.  2.  Known nonobstructive CAD in all 3 vessels In 2014, we'll schedule a dobutamine stress echo as she has change in her symptoms. For now we'll continue aspirin, atorvastatin. She didn't tolerate beta blockers. Again advised on discontinuation of smoking.  3. Chronic CHF with preserved left ventricular ejection fraction - she appears euvolemic.   4. Hypertension -  controlled.  5. Hyperlipidemia - on atorvastatin 80 mg daily, cramping have resolved, her most recent LFTs in June 2017 were normal. Her LDL currently 87, triglycerides 82 and HDL 51 all at goal.  6. PAD - poor peripheral pulses feel schedule bilateral lower extremity duplex and referred to vascular surgery as they used to follow her in the past, she also has stenting to unknown artery in her left lower extremity by Dr. Curt Jews.  7. Narcotics/anxiolytics addiction - she is explained that we will not prescribe any, she accepts it.  Follow-up in 3 months.  Ena Dawley, MD 05/23/2016, 3:55 PM

## 2016-05-23 NOTE — Patient Instructions (Signed)
Medication Instructions:   Your physician recommends that you continue on your current medications as directed. Please refer to the Current Medication list given to you today.    Testing/Procedures:  Your physician has requested that you have a lower extremity arterial duplex. This test is an ultrasound of the arteries in the legs or arms. It looks at arterial blood flow in the legs and arms. Allow one hour for Lower and Upper Arterial scans. There are no restrictions or special instructions    Follow-Up:  Your physician wants you to follow-up in: 6 MONTHS WITH DR NELSON You will receive a reminder letter in the mail two months in advance. If you don't receive a letter, please call our office to schedule the follow-up appointment.        If you need a refill on your cardiac medications before your next appointment, please call your pharmacy.   

## 2016-06-06 ENCOUNTER — Ambulatory Visit (HOSPITAL_COMMUNITY)
Admission: RE | Admit: 2016-06-06 | Discharge: 2016-06-06 | Disposition: A | Discharge status: Home / Self Care | Payer: PPO | Source: Ambulatory Visit | Attending: Internal Medicine | Admitting: Internal Medicine

## 2016-06-06 DIAGNOSIS — I10 Essential (primary) hypertension: Secondary | ICD-10-CM | POA: Diagnosis not present

## 2016-06-06 DIAGNOSIS — E785 Hyperlipidemia, unspecified: Secondary | ICD-10-CM | POA: Insufficient documentation

## 2016-06-06 DIAGNOSIS — I739 Peripheral vascular disease, unspecified: Secondary | ICD-10-CM | POA: Diagnosis not present

## 2016-06-06 DIAGNOSIS — I708 Atherosclerosis of other arteries: Secondary | ICD-10-CM | POA: Diagnosis not present

## 2016-06-06 DIAGNOSIS — Z87891 Personal history of nicotine dependence: Secondary | ICD-10-CM | POA: Diagnosis not present

## 2016-06-06 DIAGNOSIS — I700 Atherosclerosis of aorta: Secondary | ICD-10-CM | POA: Insufficient documentation

## 2016-06-06 DIAGNOSIS — I2510 Atherosclerotic heart disease of native coronary artery without angina pectoris: Secondary | ICD-10-CM | POA: Insufficient documentation

## 2016-06-06 DIAGNOSIS — J449 Chronic obstructive pulmonary disease, unspecified: Secondary | ICD-10-CM | POA: Insufficient documentation

## 2016-06-11 ENCOUNTER — Telehealth: Payer: Self-pay | Admitting: *Deleted

## 2016-06-11 DIAGNOSIS — I739 Peripheral vascular disease, unspecified: Secondary | ICD-10-CM

## 2016-06-11 NOTE — Telephone Encounter (Signed)
-----   Message from Dorothy Spark, MD sent at 06/09/2016  7:58 AM EDT ----- She has significant peripheral arterial disease in her lower extremities, I would suggest PV consult, however in the past she was following with Dr. Donnetta Hutching at vascular surgery so she can choose.

## 2016-06-11 NOTE — Telephone Encounter (Signed)
Notified the pt that per Dr Meda Coffee, her lower extremity arterial duplex showed significant peripheral arterial disease in her lower extremities.  Informed the pt that per Dr Meda Coffee, she recommends that the pt consult with one of our [place] [facility], or follow back up with Dr Early at V[facility], for she saw him in the past.  Per the pt, she prefers to see Dr Donnetta Hutching, but will need a new referral to him, for its been over 10 + years since she has seen him.  Informed the pt that I will place the referral order in the system, and send a message to our [facility] schedulers to call and coordinate an appt with Dr Luther Parody office.  Pt verbalized understanding and agrees with this plan.

## 2016-06-12 DIAGNOSIS — Z124 Encounter for screening for malignant neoplasm of cervix: Secondary | ICD-10-CM | POA: Diagnosis not present

## 2016-06-12 DIAGNOSIS — N760 Acute vaginitis: Secondary | ICD-10-CM | POA: Diagnosis not present

## 2016-06-12 DIAGNOSIS — Z779 Other contact with and (suspected) exposures hazardous to health: Secondary | ICD-10-CM | POA: Diagnosis not present

## 2016-06-12 DIAGNOSIS — Z6824 Body mass index (BMI) 24.0-24.9, adult: Secondary | ICD-10-CM | POA: Diagnosis not present

## 2016-06-14 DIAGNOSIS — E7800 Pure hypercholesterolemia, unspecified: Secondary | ICD-10-CM | POA: Diagnosis not present

## 2016-06-14 DIAGNOSIS — Z79899 Other long term (current) drug therapy: Secondary | ICD-10-CM | POA: Diagnosis not present

## 2016-06-14 DIAGNOSIS — Z6823 Body mass index (BMI) 23.0-23.9, adult: Secondary | ICD-10-CM | POA: Diagnosis not present

## 2016-06-14 DIAGNOSIS — I10 Essential (primary) hypertension: Secondary | ICD-10-CM | POA: Diagnosis not present

## 2016-06-14 DIAGNOSIS — M109 Gout, unspecified: Secondary | ICD-10-CM | POA: Diagnosis not present

## 2016-06-14 DIAGNOSIS — Z23 Encounter for immunization: Secondary | ICD-10-CM | POA: Diagnosis not present

## 2016-06-14 DIAGNOSIS — G5790 Unspecified mononeuropathy of unspecified lower limb: Secondary | ICD-10-CM | POA: Diagnosis not present

## 2016-06-14 DIAGNOSIS — R7303 Prediabetes: Secondary | ICD-10-CM | POA: Diagnosis not present

## 2016-07-10 ENCOUNTER — Encounter: Payer: Self-pay | Admitting: Vascular Surgery

## 2016-07-12 ENCOUNTER — Encounter: Payer: Self-pay | Admitting: Vascular Surgery

## 2016-07-12 ENCOUNTER — Ambulatory Visit (INDEPENDENT_AMBULATORY_CARE_PROVIDER_SITE_OTHER): Payer: PPO | Admitting: Vascular Surgery

## 2016-07-12 ENCOUNTER — Other Ambulatory Visit: Payer: Self-pay

## 2016-07-12 VITALS — BP 168/99 | HR 88 | Temp 97.9°F | Resp 14 | Ht 67.0 in | Wt 150.0 lb

## 2016-07-12 DIAGNOSIS — I739 Peripheral vascular disease, unspecified: Secondary | ICD-10-CM | POA: Diagnosis not present

## 2016-07-12 NOTE — Progress Notes (Signed)
[facility] [place]  Patient name: Marie Johnson MRN: [id number] DOB: Feb 21, 1951 Sex: female  REASON FOR CONSULT: Evaluation of severe lower extremity discomfort.  HPI: Marie Johnson is a 65 y.o. female, who is here today for evaluation of her lower extremity discomfort. She is known to me from prior left femoral-popliteal bypass approximately 12-14 years ago. She has multiple components of her lower extremity complaints. She reported a constant burning sensation in her feet bilaterally. She reports that her feet stay cold and this occurs with walking or at rest. She reports that her feet remain cyanotic despite elevation or dependency. When she walks she does report more typical claudication symptoms in her buttocks and thigh and calf bilaterally. She does report this makes it difficult for her to walk any distance. She has history of prior left hip for fracture and apparently does have some arthritis in her right hip as well. She has been told she has a diagnosis of neuropathy. She did undergo recent noninvasive studies at heart care. I have these for review from 06/06/2016. She also reports that she underwent what sounds like formal arteriogram several years ago in [facility] and that time was told that she had multilevel disease. Fortunately she has had no tissue loss.  Past Medical History:  Diagnosis Date  . AAA (abdominal aortic aneurysm) (HCC)    3.4 cm  . Anxiety   . Aortic atherosclerosis ([place])   . Arthritis   . Asthma   . CAD (coronary artery disease)    a. Nonobstructive CAD by cath 07/17/13.  . CHD (congenital heart disease)    pt unaware???  . Chest pain    a. Adm 10-07/2013 - CTA neg for PE/dissection, cath with nonobstructive disease, CP ?due to uncontrolled HTN vs coronary vasospasm.  [name] COPD (chronic obstructive pulmonary disease) ([place])   . GERD (gastroesophageal reflux disease)   . Hyperlipidemia    . Hypertension   . Neuropathy ([place])   . Osteoporosis   . PAD (peripheral artery disease) ([place])    a. s/p L fem-pop bypass b. recent evaluation at [facility], unamenable to intervention  . Premature ventricular contraction   . PUD (peptic ulcer disease)   . Tobacco abuse     Family History  Problem Relation Age of Onset  . Cervical cancer Mother   . Heart disease Mother     MI in 26s  . Liver cancer Father   . Heart disease Father   . Lung cancer Maternal Aunt   . Prostate cancer Maternal Uncle     met. anal  . Breast cancer Maternal Grandmother   . Lung cancer Maternal Aunt   . Lung cancer Maternal Aunt   . Stomach cancer Maternal Aunt   . Colon cancer Maternal Aunt   . Lung cancer Maternal Uncle     SOCIAL HISTORY: Social History   Social History  . Marital status: Married    Spouse name: N/A  . Number of children: N/A  . Years of education: N/A   Occupational History  . Not on file.   Social History Main Topics  . Smoking status: Current Every Day Smoker    Packs/day: 1.00    Types: Cigarettes  . Smokeless tobacco: Never Used  . Alcohol use No  . Drug use: No  . Sexual activity: Not on file   Other Topics Concern  . Not on file   Social History Narrative   Pt lives  in [place] with husband.  Retired Educational psychologist    Allergies  Allergen Reactions  . Bee Venom Anaphylaxis    Current Outpatient Prescriptions  Medication Sig Dispense Refill  . albuterol (PROVENTIL HFA;VENTOLIN HFA) 108 (90 BASE) MCG/ACT inhaler Inhale 2 puffs into the lungs every 6 (six) hours as needed for wheezing.    [name] alendronate (FOSAMAX) 70 MG tablet Take 70 mg by mouth once a week.    [name] allopurinol (ZYLOPRIM) 100 MG tablet Take 100 mg by mouth daily.    [name] amLODipine (NORVASC) 2.5 MG tablet Take 1 tablet (2.5 mg total) by mouth daily. 90 tablet 6  . aspirin 325 MG tablet Take 325 mg by mouth daily.    [name] atorvastatin (LIPITOR) 80 MG tablet Take 1 tablet by mouth daily.  0  . EPIPEN  2-PAK 0.3 MG/0.3ML SOAJ injection Use as directed as needed for allergies.    . furosemide (LASIX) 20 MG tablet Take 1 tablet (20 mg total) by mouth daily. 90 tablet 3  . nitroGLYCERIN (NITROSTAT) 0.4 MG SL tablet Place 1 tablet (0.4 mg total) under the tongue every 5 (five) minutes x 3 doses as needed for chest pain. 25 tablet 4  . Oxycodone HCl 10 MG TABS Take 10 mg by mouth 3 (three) times daily.     . pantoprazole (PROTONIX) 40 MG tablet Take 40 mg by mouth 2 (two) times daily.    . potassium chloride SA (K-DUR,KLOR-CON) 20 MEQ tablet Take 1 tablet (20 mEq total) by mouth daily. 90 tablet 3  . spironolactone (ALDACTONE) 25 MG tablet TAKE ONE TABLET BY MOUTH ONCE DAILY 30 tablet 0  . traMADol (ULTRAM) 50 MG tablet Take 50 mg by mouth every 6 (six) hours as needed for pain.    . verapamil (CALAN-SR) 180 MG CR tablet Take 1 tablet (180 mg total) by mouth at bedtime. 90 tablet 3   No current facility-administered medications for this visit.    Facility-Administered Medications Ordered in Other Visits  Medication Dose Route Frequency Provider Last Rate Last Dose  . DOBUTamine (DOBUTREX) 1,000 mcg/mL in dextrose 5% 250 mL infusion  30 mcg/kg/min Intravenous Titrated Dorothy Spark, MD        REVIEW OF SYSTEMS:  [X]  denotes positive finding, [ ]  denotes negative finding Cardiac  Comments:  Chest pain or chest pressure:    Shortness of breath upon exertion: x   Short of breath when lying flat: x   Irregular heart rhythm:        Vascular    Pain in calf, thigh, or hip brought on by ambulation: x   Pain in feet at night that wakes you up from your sleep:  xx   Blood clot in your veins:    Leg swelling:  x       Pulmonary    Oxygen at home:    Productive cough:     Wheezing:  x       Neurologic    Sudden weakness in arms or legs:     Sudden numbness in arms or legs:     Sudden onset of difficulty speaking or slurred speech:    Temporary loss of vision in one eye:     Problems  with dizziness:  x       Gastrointestinal    Blood in stool:     Vomited blood:         Genitourinary    Burning when urinating:     Blood  in urine:        Psychiatric    Major depression:         Hematologic    Bleeding problems:    Problems with blood clotting too easily:        Skin    Rashes or ulcers: x       Constitutional    Fever or chills:      PHYSICAL EXAM: Vitals:   07/12/16 0936 07/12/16 0945  BP: (!) 164/91 (!) 168/99  Pulse: 87 88  Resp: 14   Temp: 97.9 F (36.6 C)   SpO2: 99%   Weight: 150 lb (68 kg)   Height: 5' 7"  (1.702 m)     GENERAL: The patient is a well-nourished female, in no acute distress. The vital signs are documented above. CARDIOVASCULAR: She has a palpable radial pulses bilaterally. Carotid arteries without bruits bilaterally. She has a 2+ right femoral and 1+ left femoral pulse. I am able to palpate left popliteal pulse below her bypass. I do not palpate pedal pulses bilaterally PULMONARY: There is good air exchange  ABDOMEN: Soft and non-tender  MUSCULOSKELETAL: There are no major deformities or cyanosis. NEUROLOGIC: No focal weakness or paresthesias are detected. SKIN: There are no ulcers or rashes noted. He does have cyanosis in her feet bilaterally on dependency PSYCHIATRIC: The patient has a normal affect.  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 Show occlusion of her right SFA and patency of her left femoropopliteal bypass.  MEDICAL ISSUES: Had long discussion with the patient and her husband present. I feel that this her she has multiple etiologies causing discomfort. I feel that her resting symptoms with burning and stinging in her feet is related more to neurologic difficulty than arterial difficulty. She does have a clear-cut claudication as well. I have recommended formal arteriography for further evaluation to determine treatment options. Explained that if she does indeed have severe  aortic aortoiliac occlusive disease in all likelihood this could be corrected with angioplasty and stenting. She wished to proceed with further evaluation as soon as possible.   Rosetta Posner, MD FACS [facility] Tel [phone] Pager [phone]

## 2016-07-12 NOTE — Progress Notes (Signed)
Vitals:   07/12/16 0936  BP: (!) 164/91  Pulse: 87  Resp: 14  Temp: 97.9 F (36.6 C)  SpO2: 99%  Weight: 150 lb (68 kg)  Height: 5\' 7"  (1.702 m)

## 2016-07-13 DIAGNOSIS — M1611 Unilateral primary osteoarthritis, right hip: Secondary | ICD-10-CM | POA: Diagnosis not present

## 2016-07-16 ENCOUNTER — Encounter (HOSPITAL_COMMUNITY)
Admission: RE | Disposition: A | Discharge status: Home / Self Care | Payer: Self-pay | Source: Ambulatory Visit | Attending: Vascular Surgery

## 2016-07-16 ENCOUNTER — Ambulatory Visit (HOSPITAL_COMMUNITY)
Admission: RE | Admit: 2016-07-16 | Discharge: 2016-07-16 | Disposition: A | Discharge status: Home / Self Care | Payer: PPO | Source: Ambulatory Visit | Attending: Vascular Surgery | Admitting: Vascular Surgery

## 2016-07-16 DIAGNOSIS — I10 Essential (primary) hypertension: Secondary | ICD-10-CM | POA: Diagnosis not present

## 2016-07-16 DIAGNOSIS — Z7982 Long term (current) use of aspirin: Secondary | ICD-10-CM | POA: Insufficient documentation

## 2016-07-16 DIAGNOSIS — F17210 Nicotine dependence, cigarettes, uncomplicated: Secondary | ICD-10-CM | POA: Diagnosis not present

## 2016-07-16 DIAGNOSIS — E785 Hyperlipidemia, unspecified: Secondary | ICD-10-CM | POA: Diagnosis not present

## 2016-07-16 DIAGNOSIS — J449 Chronic obstructive pulmonary disease, unspecified: Secondary | ICD-10-CM | POA: Insufficient documentation

## 2016-07-16 DIAGNOSIS — I701 Atherosclerosis of renal artery: Secondary | ICD-10-CM | POA: Diagnosis not present

## 2016-07-16 DIAGNOSIS — Z79899 Other long term (current) drug therapy: Secondary | ICD-10-CM | POA: Insufficient documentation

## 2016-07-16 DIAGNOSIS — I745 Embolism and thrombosis of iliac artery: Secondary | ICD-10-CM | POA: Insufficient documentation

## 2016-07-16 DIAGNOSIS — Z7983 Long term (current) use of bisphosphonates: Secondary | ICD-10-CM | POA: Insufficient documentation

## 2016-07-16 DIAGNOSIS — K219 Gastro-esophageal reflux disease without esophagitis: Secondary | ICD-10-CM | POA: Diagnosis not present

## 2016-07-16 DIAGNOSIS — I739 Peripheral vascular disease, unspecified: Secondary | ICD-10-CM | POA: Diagnosis not present

## 2016-07-16 DIAGNOSIS — Z79891 Long term (current) use of opiate analgesic: Secondary | ICD-10-CM | POA: Diagnosis not present

## 2016-07-16 DIAGNOSIS — M810 Age-related osteoporosis without current pathological fracture: Secondary | ICD-10-CM | POA: Insufficient documentation

## 2016-07-16 DIAGNOSIS — I2510 Atherosclerotic heart disease of native coronary artery without angina pectoris: Secondary | ICD-10-CM | POA: Diagnosis not present

## 2016-07-16 DIAGNOSIS — I70223 Atherosclerosis of native arteries of extremities with rest pain, bilateral legs: Secondary | ICD-10-CM | POA: Diagnosis not present

## 2016-07-16 DIAGNOSIS — I70211 Atherosclerosis of native arteries of extremities with intermittent claudication, right leg: Secondary | ICD-10-CM | POA: Diagnosis not present

## 2016-07-16 HISTORY — PX: PERIPHERAL VASCULAR CATHETERIZATION: SHX172C

## 2016-07-16 LAB — POCT I-STAT, CHEM 8
BUN: 22 mg/dL — ABNORMAL HIGH (ref 6–20)
CREATININE: 0.8 mg/dL (ref 0.44–1.00)
Calcium, Ion: 1.17 mmol/L (ref 1.15–1.40)
Chloride: 104 mmol/L (ref 101–111)
Glucose, Bld: 81 mg/dL (ref 65–99)
HEMATOCRIT: 40 % (ref 36.0–46.0)
HEMOGLOBIN: 13.6 g/dL (ref 12.0–15.0)
POTASSIUM: 3.7 mmol/L (ref 3.5–5.1)
SODIUM: 140 mmol/L (ref 135–145)
TCO2: 26 mmol/L (ref 0–100)

## 2016-07-16 SURGERY — ABDOMINAL AORTOGRAM W/LOWER EXTREMITY

## 2016-07-16 MED ORDER — MORPHINE SULFATE (PF) 2 MG/ML IV SOLN
1.0000 mg | INTRAVENOUS | Status: DC | PRN
  Administered 2016-07-16 (×2): 2 mg via INTRAVENOUS

## 2016-07-16 MED ORDER — LIDOCAINE HCL (PF) 1 % IJ SOLN
INTRAMUSCULAR | Status: AC
  Filled 2016-07-16: qty 30

## 2016-07-16 MED ORDER — HEPARIN (PORCINE) IN NACL 2-0.9 UNIT/ML-% IJ SOLN
INTRAMUSCULAR | Status: DC | PRN
  Administered 2016-07-16: 1000 mL via INTRA_ARTERIAL

## 2016-07-16 MED ORDER — MORPHINE SULFATE (PF) 2 MG/ML IV SOLN
INTRAVENOUS | Status: AC
  Filled 2016-07-16: qty 1

## 2016-07-16 MED ORDER — MIDAZOLAM HCL 2 MG/2ML IJ SOLN
INTRAMUSCULAR | Status: AC
  Filled 2016-07-16: qty 2

## 2016-07-16 MED ORDER — OXYCODONE-ACETAMINOPHEN 5-325 MG PO TABS
1.0000 | ORAL_TABLET | ORAL | Status: DC | PRN
  Administered 2016-07-16: 2 via ORAL

## 2016-07-16 MED ORDER — FENTANYL CITRATE (PF) 100 MCG/2ML IJ SOLN
INTRAMUSCULAR | Status: AC
  Filled 2016-07-16: qty 2

## 2016-07-16 MED ORDER — HEPARIN (PORCINE) IN NACL 2-0.9 UNIT/ML-% IJ SOLN
INTRAMUSCULAR | Status: AC
  Filled 2016-07-16: qty 1000

## 2016-07-16 MED ORDER — IODIXANOL 320 MG/ML IV SOLN
INTRAVENOUS | Status: DC | PRN
  Administered 2016-07-16: 97 mL via INTRA_ARTERIAL

## 2016-07-16 MED ORDER — MIDAZOLAM HCL 2 MG/2ML IJ SOLN
INTRAMUSCULAR | Status: DC | PRN
  Administered 2016-07-16 (×2): 1 mg via INTRAVENOUS

## 2016-07-16 MED ORDER — SODIUM CHLORIDE 0.9 % IV SOLN
INTRAVENOUS | Status: DC
  Administered 2016-07-16: 12:00:00 via INTRAVENOUS

## 2016-07-16 MED ORDER — FENTANYL CITRATE (PF) 100 MCG/2ML IJ SOLN
INTRAMUSCULAR | Status: DC | PRN
  Administered 2016-07-16 (×3): 25 ug via INTRAVENOUS

## 2016-07-16 MED ORDER — LIDOCAINE HCL (PF) 1 % IJ SOLN
INTRAMUSCULAR | Status: DC | PRN
  Administered 2016-07-16: 10 mL via SUBCUTANEOUS

## 2016-07-16 MED ORDER — SODIUM CHLORIDE 0.9 % IV SOLN: 1.0000 mL/kg/h | INTRAVENOUS | Status: DC

## 2016-07-16 MED ORDER — LABETALOL HCL 5 MG/ML IV SOLN: 10.0000 mg | INTRAVENOUS | Status: DC | PRN

## 2016-07-16 SURGICAL SUPPLY — 8 items
CATH OMNI FLUSH 5F 65CM (CATHETERS) ×2 IMPLANT
KIT MICROINTRODUCER STIFF 5F (SHEATH) ×2 IMPLANT
KIT PV (KITS) ×2 IMPLANT
SHEATH PINNACLE 5F 10CM (SHEATH) ×2 IMPLANT
SYR MEDRAD MARK V 150ML (SYRINGE) ×2 IMPLANT
TRANSDUCER W/STOPCOCK (MISCELLANEOUS) ×2 IMPLANT
TRAY PV CATH (CUSTOM PROCEDURE TRAY) ×2 IMPLANT
WIRE J 3MM .035X145CM (WIRE) ×2 IMPLANT

## 2016-07-16 NOTE — Progress Notes (Signed)
Report received from [facility] at [phone]; client up and walked and tolerated well; right groin stable; no bleeding or hematoma

## 2016-07-16 NOTE — Progress Notes (Addendum)
Site area: RFA Site Prior to Removal:  Level 0 Pressure Applied For:20 min Manual:   yes Patient Status During Pull:  stable Post Pull Site:  Level 0 Post Pull Instructions Given:  yes Post Pull Pulses Present: doppler Dressing Applied:  tegaderm Bedrest begins @ [id number] Comments:

## 2016-07-16 NOTE — Discharge Instructions (Signed)

## 2016-07-16 NOTE — Interval H&P Note (Signed)
History and Physical Interval Note:  [id number] PM  Marie Johnson  has presented today for surgery, with the diagnosis of PVD w/ bilat claudication  The various methods of treatment have been discussed with the patient and family. After consideration of risks, benefits and other options for treatment, the patient has consented to  Procedure(s): Abdominal Aortogram w/Lower Extremity (N/A) as a surgical intervention .  The patient's history has been reviewed, patient examined, no change in status, stable for surgery.  I have reviewed the patient's chart and labs.  Questions were answered to the patient's satisfaction.     Curt Jews

## 2016-07-16 NOTE — H&P (View-Only) (Signed)
[facility] [place]  Patient name: Marie Johnson MRN: [id number] DOB: 12-Jan-1951 Sex: female  REASON FOR CONSULT: Evaluation of severe lower extremity discomfort.  HPI: Marie Johnson is a 65 y.o. female, who is here today for evaluation of her lower extremity discomfort. She is known to me from prior left femoral-popliteal bypass approximately 12-14 years ago. She has multiple components of her lower extremity complaints. She reported a constant burning sensation in her feet bilaterally. She reports that her feet stay cold and this occurs with walking or at rest. She reports that her feet remain cyanotic despite elevation or dependency. When she walks she does report more typical claudication symptoms in her buttocks and thigh and calf bilaterally. She does report this makes it difficult for her to walk any distance. She has history of prior left hip for fracture and apparently does have some arthritis in her right hip as well. She has been told she has a diagnosis of neuropathy. She did undergo recent noninvasive studies at heart care. I have these for review from 06/06/2016. She also reports that she underwent what sounds like formal arteriogram several years ago in [facility] and that time was told that she had multilevel disease. Fortunately she has had no tissue loss.  Past Medical History:  Diagnosis Date  . AAA (abdominal aortic aneurysm) (HCC)    3.4 cm  . Anxiety   . Aortic atherosclerosis ([place])   . Arthritis   . Asthma   . CAD (coronary artery disease)    a. Nonobstructive CAD by cath 07/17/13.  . CHD (congenital heart disease)    pt unaware???  . Chest pain    a. Adm 10-07/2013 - CTA neg for PE/dissection, cath with nonobstructive disease, CP ?due to uncontrolled HTN vs coronary vasospasm.  [name] COPD (chronic obstructive pulmonary disease) ([place])   . GERD (gastroesophageal reflux disease)   . Hyperlipidemia    . Hypertension   . Neuropathy ([place])   . Osteoporosis   . PAD (peripheral artery disease) ([place])    a. s/p L fem-pop bypass b. recent evaluation at [facility], unamenable to intervention  . Premature ventricular contraction   . PUD (peptic ulcer disease)   . Tobacco abuse     Family History  Problem Relation Age of Onset  . Cervical cancer Mother   . Heart disease Mother     MI in 59s  . Liver cancer Father   . Heart disease Father   . Lung cancer Maternal Aunt   . Prostate cancer Maternal Uncle     met. anal  . Breast cancer Maternal Grandmother   . Lung cancer Maternal Aunt   . Lung cancer Maternal Aunt   . Stomach cancer Maternal Aunt   . Colon cancer Maternal Aunt   . Lung cancer Maternal Uncle     SOCIAL HISTORY: Social History   Social History  . Marital status: Married    Spouse name: N/A  . Number of children: N/A  . Years of education: N/A   Occupational History  . Not on file.   Social History Main Topics  . Smoking status: Current Every Day Smoker    Packs/day: 1.00    Types: Cigarettes  . Smokeless tobacco: Never Used  . Alcohol use No  . Drug use: No  . Sexual activity: Not on file   Other Topics Concern  . Not on file   Social History Narrative   Pt lives  in [place] with husband.  Retired Educational psychologist    Allergies  Allergen Reactions  . Bee Venom Anaphylaxis    Current Outpatient Prescriptions  Medication Sig Dispense Refill  . albuterol (PROVENTIL HFA;VENTOLIN HFA) 108 (90 BASE) MCG/ACT inhaler Inhale 2 puffs into the lungs every 6 (six) hours as needed for wheezing.    [name] alendronate (FOSAMAX) 70 MG tablet Take 70 mg by mouth once a week.    [name] allopurinol (ZYLOPRIM) 100 MG tablet Take 100 mg by mouth daily.    [name] amLODipine (NORVASC) 2.5 MG tablet Take 1 tablet (2.5 mg total) by mouth daily. 90 tablet 6  . aspirin 325 MG tablet Take 325 mg by mouth daily.    [name] atorvastatin (LIPITOR) 80 MG tablet Take 1 tablet by mouth daily.  0  . EPIPEN  2-PAK 0.3 MG/0.3ML SOAJ injection Use as directed as needed for allergies.    . furosemide (LASIX) 20 MG tablet Take 1 tablet (20 mg total) by mouth daily. 90 tablet 3  . nitroGLYCERIN (NITROSTAT) 0.4 MG SL tablet Place 1 tablet (0.4 mg total) under the tongue every 5 (five) minutes x 3 doses as needed for chest pain. 25 tablet 4  . Oxycodone HCl 10 MG TABS Take 10 mg by mouth 3 (three) times daily.     . pantoprazole (PROTONIX) 40 MG tablet Take 40 mg by mouth 2 (two) times daily.    . potassium chloride SA (K-DUR,KLOR-CON) 20 MEQ tablet Take 1 tablet (20 mEq total) by mouth daily. 90 tablet 3  . spironolactone (ALDACTONE) 25 MG tablet TAKE ONE TABLET BY MOUTH ONCE DAILY 30 tablet 0  . traMADol (ULTRAM) 50 MG tablet Take 50 mg by mouth every 6 (six) hours as needed for pain.    . verapamil (CALAN-SR) 180 MG CR tablet Take 1 tablet (180 mg total) by mouth at bedtime. 90 tablet 3   No current facility-administered medications for this visit.    Facility-Administered Medications Ordered in Other Visits  Medication Dose Route Frequency Provider Last Rate Last Dose  . DOBUTamine (DOBUTREX) 1,000 mcg/mL in dextrose 5% 250 mL infusion  30 mcg/kg/min Intravenous Titrated Dorothy Spark, MD        REVIEW OF SYSTEMS:  [X]  denotes positive finding, [ ]  denotes negative finding Cardiac  Comments:  Chest pain or chest pressure:    Shortness of breath upon exertion: x   Short of breath when lying flat: x   Irregular heart rhythm:        Vascular    Pain in calf, thigh, or hip brought on by ambulation: x   Pain in feet at night that wakes you up from your sleep:  xx   Blood clot in your veins:    Leg swelling:  x       Pulmonary    Oxygen at home:    Productive cough:     Wheezing:  x       Neurologic    Sudden weakness in arms or legs:     Sudden numbness in arms or legs:     Sudden onset of difficulty speaking or slurred speech:    Temporary loss of vision in one eye:     Problems  with dizziness:  x       Gastrointestinal    Blood in stool:     Vomited blood:         Genitourinary    Burning when urinating:     Blood  in urine:        Psychiatric    Major depression:         Hematologic    Bleeding problems:    Problems with blood clotting too easily:        Skin    Rashes or ulcers: x       Constitutional    Fever or chills:      PHYSICAL EXAM: Vitals:   07/12/16 0936 07/12/16 0945  BP: (!) 164/91 (!) 168/99  Pulse: 87 88  Resp: 14   Temp: 97.9 F (36.6 C)   SpO2: 99%   Weight: 150 lb (68 kg)   Height: 5' 7"  (1.702 m)     GENERAL: The patient is a well-nourished female, in no acute distress. The vital signs are documented above. CARDIOVASCULAR: She has a palpable radial pulses bilaterally. Carotid arteries without bruits bilaterally. She has a 2+ right femoral and 1+ left femoral pulse. I am able to palpate left popliteal pulse below her bypass. I do not palpate pedal pulses bilaterally PULMONARY: There is good air exchange  ABDOMEN: Soft and non-tender  MUSCULOSKELETAL: There are no major deformities or cyanosis. NEUROLOGIC: No focal weakness or paresthesias are detected. SKIN: There are no ulcers or rashes noted. He does have cyanosis in her feet bilaterally on dependency PSYCHIATRIC: The patient has a normal affect.  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 Show occlusion of her right SFA and patency of her left femoropopliteal bypass.  MEDICAL ISSUES: Had long discussion with the patient and her husband present. I feel that this her she has multiple etiologies causing discomfort. I feel that her resting symptoms with burning and stinging in her feet is related more to neurologic difficulty than arterial difficulty. She does have a clear-cut claudication as well. I have recommended formal arteriography for further evaluation to determine treatment options. Explained that if she does indeed have severe  aortic aortoiliac occlusive disease in all likelihood this could be corrected with angioplasty and stenting. She wished to proceed with further evaluation as soon as possible.   Rosetta Posner, MD FACS [facility] Tel [phone] Pager [phone]

## 2016-07-16 NOTE — Op Note (Signed)
    OPERATIVE REPORT  DATE OF SURGERY: [id number]  PATIENT: Marie Johnson, 65 y.o. female MRN: [id number]  DOB: 1951/03/25  PRE-OPERATIVE DIAGNOSIS: Bilateral lower extremity discomfort with moderate arterial insufficiency  POST-OPERATIVE DIAGNOSIS:  Same  PROCEDURE: Aortogram with bilateral lower extremity runoff  SURGEON:  Curt Jews, M.D.  ANESTHESIA:  Local with sedation  PLAN OF CARE: Holding area stable   PATIENT DISPOSITION:  PACU - hemodynamically stable  PROCEDURE DETAILS: Patient was taken to the progression peripheral [facility] cath lab placed supine position where they're both groins prepped in sterile fashion. Using local anesthesia and SonoSite visualization the right common femoral artery was entered with a singlewall puncture with an 18-gauge needle. A guidewire was passed centrally and a 5 French sheath was passed over the guidewire. An Omni Flush catheter was positioned above the level the renal arteries and AP projection was undertaken. This revealed irregularity but wide patency of the aortoiliac segments. 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 The left internal iliac artery was occluded at its origin and reconstituted via collaterals. The right internal iliac artery was patent. The catheter was then withdrawn down below the level the renal arteries and runoff was obtained. On the right the common femoral artery was patent. The superficial femoral artery was occluded and there was reconstitution of the right above-knee popliteal artery. There was runoff via the anterior tibial and peroneal arteries on the right. On the left patient had a prior placed left femoral-popliteal bypass which was to the above-knee position. This was widely patent with no evidence of graft stenosis. There was two-vessel runoff. The anterior tibial and peroneal arteries. Patient tolerated procedure without immediate consultation was transferred to the recovery room in stable  condition where sheath was pulled   Curt Jews, M.D. 07/16/2016 2:00 PM

## 2016-07-17 ENCOUNTER — Encounter (HOSPITAL_COMMUNITY): Payer: Self-pay | Admitting: Vascular Surgery

## 2016-07-17 ENCOUNTER — Encounter: Payer: PPO | Admitting: Vascular Surgery

## 2016-07-23 DIAGNOSIS — Z01818 Encounter for other preprocedural examination: Secondary | ICD-10-CM | POA: Diagnosis not present

## 2016-07-23 DIAGNOSIS — M79609 Pain in unspecified limb: Secondary | ICD-10-CM | POA: Diagnosis not present

## 2016-07-23 DIAGNOSIS — R918 Other nonspecific abnormal finding of lung field: Secondary | ICD-10-CM | POA: Diagnosis not present

## 2016-07-23 DIAGNOSIS — Z79899 Other long term (current) drug therapy: Secondary | ICD-10-CM | POA: Diagnosis not present

## 2016-07-23 DIAGNOSIS — R52 Pain, unspecified: Secondary | ICD-10-CM | POA: Diagnosis not present

## 2016-07-23 DIAGNOSIS — Z01810 Encounter for preprocedural cardiovascular examination: Secondary | ICD-10-CM | POA: Diagnosis not present

## 2016-07-23 DIAGNOSIS — I517 Cardiomegaly: Secondary | ICD-10-CM | POA: Diagnosis not present

## 2016-07-27 DIAGNOSIS — M25551 Pain in right hip: Secondary | ICD-10-CM | POA: Diagnosis not present

## 2016-07-27 DIAGNOSIS — Z6824 Body mass index (BMI) 24.0-24.9, adult: Secondary | ICD-10-CM | POA: Diagnosis not present

## 2016-07-27 DIAGNOSIS — Z01818 Encounter for other preprocedural examination: Secondary | ICD-10-CM | POA: Diagnosis not present

## 2016-08-01 DIAGNOSIS — G4733 Obstructive sleep apnea (adult) (pediatric): Secondary | ICD-10-CM | POA: Diagnosis not present

## 2016-08-01 DIAGNOSIS — J301 Allergic rhinitis due to pollen: Secondary | ICD-10-CM | POA: Diagnosis not present

## 2016-08-01 DIAGNOSIS — J449 Chronic obstructive pulmonary disease, unspecified: Secondary | ICD-10-CM | POA: Diagnosis not present

## 2016-08-01 DIAGNOSIS — R5383 Other fatigue: Secondary | ICD-10-CM | POA: Diagnosis not present

## 2016-08-01 DIAGNOSIS — F17210 Nicotine dependence, cigarettes, uncomplicated: Secondary | ICD-10-CM | POA: Diagnosis not present

## 2016-08-02 DIAGNOSIS — G4733 Obstructive sleep apnea (adult) (pediatric): Secondary | ICD-10-CM | POA: Diagnosis not present

## 2016-08-07 DIAGNOSIS — Z01818 Encounter for other preprocedural examination: Secondary | ICD-10-CM | POA: Diagnosis not present

## 2016-08-07 DIAGNOSIS — M1611 Unilateral primary osteoarthritis, right hip: Secondary | ICD-10-CM | POA: Diagnosis not present

## 2016-08-13 DIAGNOSIS — I2510 Atherosclerotic heart disease of native coronary artery without angina pectoris: Secondary | ICD-10-CM | POA: Diagnosis not present

## 2016-08-13 DIAGNOSIS — E559 Vitamin D deficiency, unspecified: Secondary | ICD-10-CM | POA: Diagnosis not present

## 2016-08-13 DIAGNOSIS — J449 Chronic obstructive pulmonary disease, unspecified: Secondary | ICD-10-CM | POA: Diagnosis not present

## 2016-08-13 DIAGNOSIS — Z91040 Latex allergy status: Secondary | ICD-10-CM | POA: Diagnosis not present

## 2016-08-13 DIAGNOSIS — F17210 Nicotine dependence, cigarettes, uncomplicated: Secondary | ICD-10-CM | POA: Diagnosis not present

## 2016-08-13 DIAGNOSIS — E7800 Pure hypercholesterolemia, unspecified: Secondary | ICD-10-CM | POA: Diagnosis not present

## 2016-08-13 DIAGNOSIS — Z79899 Other long term (current) drug therapy: Secondary | ICD-10-CM | POA: Diagnosis not present

## 2016-08-13 DIAGNOSIS — I739 Peripheral vascular disease, unspecified: Secondary | ICD-10-CM | POA: Diagnosis not present

## 2016-08-13 DIAGNOSIS — M1611 Unilateral primary osteoarthritis, right hip: Secondary | ICD-10-CM | POA: Diagnosis not present

## 2016-08-13 DIAGNOSIS — I10 Essential (primary) hypertension: Secondary | ICD-10-CM | POA: Diagnosis not present

## 2016-08-13 DIAGNOSIS — Z7982 Long term (current) use of aspirin: Secondary | ICD-10-CM | POA: Diagnosis not present

## 2016-08-13 DIAGNOSIS — Z79891 Long term (current) use of opiate analgesic: Secondary | ICD-10-CM | POA: Diagnosis not present

## 2016-08-17 DIAGNOSIS — M1611 Unilateral primary osteoarthritis, right hip: Secondary | ICD-10-CM | POA: Diagnosis not present

## 2016-08-17 DIAGNOSIS — M25551 Pain in right hip: Secondary | ICD-10-CM | POA: Diagnosis not present

## 2016-08-17 DIAGNOSIS — M25651 Stiffness of right hip, not elsewhere classified: Secondary | ICD-10-CM | POA: Diagnosis not present

## 2016-08-17 DIAGNOSIS — R2689 Other abnormalities of gait and mobility: Secondary | ICD-10-CM | POA: Diagnosis not present

## 2016-08-17 DIAGNOSIS — M6281 Muscle weakness (generalized): Secondary | ICD-10-CM | POA: Diagnosis not present

## 2016-08-21 DIAGNOSIS — M6281 Muscle weakness (generalized): Secondary | ICD-10-CM | POA: Diagnosis not present

## 2016-08-21 DIAGNOSIS — R2689 Other abnormalities of gait and mobility: Secondary | ICD-10-CM | POA: Diagnosis not present

## 2016-08-21 DIAGNOSIS — M1611 Unilateral primary osteoarthritis, right hip: Secondary | ICD-10-CM | POA: Diagnosis not present

## 2016-08-21 DIAGNOSIS — M25651 Stiffness of right hip, not elsewhere classified: Secondary | ICD-10-CM | POA: Diagnosis not present

## 2016-08-21 DIAGNOSIS — M25551 Pain in right hip: Secondary | ICD-10-CM | POA: Diagnosis not present

## 2016-08-24 DIAGNOSIS — M25551 Pain in right hip: Secondary | ICD-10-CM | POA: Diagnosis not present

## 2016-08-24 DIAGNOSIS — M1611 Unilateral primary osteoarthritis, right hip: Secondary | ICD-10-CM | POA: Diagnosis not present

## 2016-08-24 DIAGNOSIS — M6281 Muscle weakness (generalized): Secondary | ICD-10-CM | POA: Diagnosis not present

## 2016-08-24 DIAGNOSIS — R2689 Other abnormalities of gait and mobility: Secondary | ICD-10-CM | POA: Diagnosis not present

## 2016-08-24 DIAGNOSIS — M25651 Stiffness of right hip, not elsewhere classified: Secondary | ICD-10-CM | POA: Diagnosis not present

## 2016-08-28 DIAGNOSIS — M25651 Stiffness of right hip, not elsewhere classified: Secondary | ICD-10-CM | POA: Diagnosis not present

## 2016-08-28 DIAGNOSIS — M1611 Unilateral primary osteoarthritis, right hip: Secondary | ICD-10-CM | POA: Diagnosis not present

## 2016-08-28 DIAGNOSIS — M25551 Pain in right hip: Secondary | ICD-10-CM | POA: Diagnosis not present

## 2016-08-28 DIAGNOSIS — R2689 Other abnormalities of gait and mobility: Secondary | ICD-10-CM | POA: Diagnosis not present

## 2016-08-28 DIAGNOSIS — M6281 Muscle weakness (generalized): Secondary | ICD-10-CM | POA: Diagnosis not present

## 2016-08-31 DIAGNOSIS — R2689 Other abnormalities of gait and mobility: Secondary | ICD-10-CM | POA: Diagnosis not present

## 2016-08-31 DIAGNOSIS — M25651 Stiffness of right hip, not elsewhere classified: Secondary | ICD-10-CM | POA: Diagnosis not present

## 2016-08-31 DIAGNOSIS — M6281 Muscle weakness (generalized): Secondary | ICD-10-CM | POA: Diagnosis not present

## 2016-08-31 DIAGNOSIS — M1611 Unilateral primary osteoarthritis, right hip: Secondary | ICD-10-CM | POA: Diagnosis not present

## 2016-08-31 DIAGNOSIS — M25551 Pain in right hip: Secondary | ICD-10-CM | POA: Diagnosis not present

## 2016-09-04 DIAGNOSIS — M25651 Stiffness of right hip, not elsewhere classified: Secondary | ICD-10-CM | POA: Diagnosis not present

## 2016-09-04 DIAGNOSIS — M25551 Pain in right hip: Secondary | ICD-10-CM | POA: Diagnosis not present

## 2016-09-04 DIAGNOSIS — M6281 Muscle weakness (generalized): Secondary | ICD-10-CM | POA: Diagnosis not present

## 2016-09-04 DIAGNOSIS — M1611 Unilateral primary osteoarthritis, right hip: Secondary | ICD-10-CM | POA: Diagnosis not present

## 2016-09-04 DIAGNOSIS — R2689 Other abnormalities of gait and mobility: Secondary | ICD-10-CM | POA: Diagnosis not present

## 2016-09-07 DIAGNOSIS — M25551 Pain in right hip: Secondary | ICD-10-CM | POA: Diagnosis not present

## 2016-09-07 DIAGNOSIS — M6281 Muscle weakness (generalized): Secondary | ICD-10-CM | POA: Diagnosis not present

## 2016-09-07 DIAGNOSIS — R2689 Other abnormalities of gait and mobility: Secondary | ICD-10-CM | POA: Diagnosis not present

## 2016-09-07 DIAGNOSIS — M1611 Unilateral primary osteoarthritis, right hip: Secondary | ICD-10-CM | POA: Diagnosis not present

## 2016-09-07 DIAGNOSIS — M25651 Stiffness of right hip, not elsewhere classified: Secondary | ICD-10-CM | POA: Diagnosis not present

## 2016-09-12 DIAGNOSIS — M25651 Stiffness of right hip, not elsewhere classified: Secondary | ICD-10-CM | POA: Diagnosis not present

## 2016-09-12 DIAGNOSIS — R2689 Other abnormalities of gait and mobility: Secondary | ICD-10-CM | POA: Diagnosis not present

## 2016-09-12 DIAGNOSIS — M25551 Pain in right hip: Secondary | ICD-10-CM | POA: Diagnosis not present

## 2016-09-12 DIAGNOSIS — M6281 Muscle weakness (generalized): Secondary | ICD-10-CM | POA: Diagnosis not present

## 2016-09-12 DIAGNOSIS — M1611 Unilateral primary osteoarthritis, right hip: Secondary | ICD-10-CM | POA: Diagnosis not present

## 2016-09-14 DIAGNOSIS — M1611 Unilateral primary osteoarthritis, right hip: Secondary | ICD-10-CM | POA: Diagnosis not present

## 2016-09-14 DIAGNOSIS — M25651 Stiffness of right hip, not elsewhere classified: Secondary | ICD-10-CM | POA: Diagnosis not present

## 2016-09-14 DIAGNOSIS — M25551 Pain in right hip: Secondary | ICD-10-CM | POA: Diagnosis not present

## 2016-09-14 DIAGNOSIS — M6281 Muscle weakness (generalized): Secondary | ICD-10-CM | POA: Diagnosis not present

## 2016-09-14 DIAGNOSIS — R2689 Other abnormalities of gait and mobility: Secondary | ICD-10-CM | POA: Diagnosis not present

## 2016-09-17 ENCOUNTER — Other Ambulatory Visit: Payer: Self-pay

## 2016-09-17 NOTE — Patient Outreach (Signed)
[facility]) Care Management  [id number]  Marie Johnson [id number]  Telephone call to patient regarding silverback referral. Unable to reach patient. HIPAA compliant voice message left with call back phone number.   PLAN; RNCM will attempt 2nd telephone call to patient within 1 week.   Quinn Plowman RN,BSN,CCM [facility] Telephonic  [phone]

## 2016-09-19 DIAGNOSIS — M109 Gout, unspecified: Secondary | ICD-10-CM | POA: Diagnosis not present

## 2016-09-19 DIAGNOSIS — I5032 Chronic diastolic (congestive) heart failure: Secondary | ICD-10-CM | POA: Diagnosis not present

## 2016-09-19 DIAGNOSIS — K219 Gastro-esophageal reflux disease without esophagitis: Secondary | ICD-10-CM | POA: Diagnosis not present

## 2016-09-19 DIAGNOSIS — M549 Dorsalgia, unspecified: Secondary | ICD-10-CM | POA: Diagnosis not present

## 2016-09-19 DIAGNOSIS — Z79899 Other long term (current) drug therapy: Secondary | ICD-10-CM | POA: Diagnosis not present

## 2016-09-19 DIAGNOSIS — Z1389 Encounter for screening for other disorder: Secondary | ICD-10-CM | POA: Diagnosis not present

## 2016-09-19 DIAGNOSIS — I10 Essential (primary) hypertension: Secondary | ICD-10-CM | POA: Diagnosis not present

## 2016-09-19 DIAGNOSIS — I2510 Atherosclerotic heart disease of native coronary artery without angina pectoris: Secondary | ICD-10-CM | POA: Diagnosis not present

## 2016-09-19 DIAGNOSIS — Z1231 Encounter for screening mammogram for malignant neoplasm of breast: Secondary | ICD-10-CM | POA: Diagnosis not present

## 2016-09-19 DIAGNOSIS — I714 Abdominal aortic aneurysm, without rupture: Secondary | ICD-10-CM | POA: Diagnosis not present

## 2016-09-19 DIAGNOSIS — I739 Peripheral vascular disease, unspecified: Secondary | ICD-10-CM | POA: Diagnosis not present

## 2016-09-19 DIAGNOSIS — Z6823 Body mass index (BMI) 23.0-23.9, adult: Secondary | ICD-10-CM | POA: Diagnosis not present

## 2016-09-20 ENCOUNTER — Ambulatory Visit: Payer: Self-pay

## 2016-09-21 DIAGNOSIS — Z1231 Encounter for screening mammogram for malignant neoplasm of breast: Secondary | ICD-10-CM | POA: Diagnosis not present

## 2016-09-25 ENCOUNTER — Other Ambulatory Visit: Payer: Self-pay

## 2016-09-25 DIAGNOSIS — M1611 Unilateral primary osteoarthritis, right hip: Secondary | ICD-10-CM | POA: Diagnosis not present

## 2016-09-26 NOTE — Patient Outreach (Signed)
[place] [facility]) Care Management  [id number]  BRIANNE SOBIE [id number]  Late entry for 09/25/16 Second telephone call to patient regarding [facility] referral.  Unable to reach patient. HIPAA compliant voice message left with call back phone number.   PLAN; RNCM will attempt 3rd telephone outreach to patient within 1 week.   Quinn Plowman RN,BSN,CCM [facility] Telephonic  [phone]

## 2016-09-28 ENCOUNTER — Other Ambulatory Visit: Payer: Self-pay

## 2016-09-28 NOTE — Patient Outreach (Signed)
[place] [facility]) Care Management  [id number]  Marie Johnson [id number]  REFERRAL DATE: 09/04/16 REFERRAL SOURCE; Silverback / HTA REFERRAL REASON; ongoing education and disease management CONSENT; HIPAA verified with patient.   SUBJECTIVE; Returned patients telephone call.  Discussed and offered [facility] care management services to patient.  Patient states she does not need services at this time. Patient states she had Right total hip replacement in November 2017.  Patient states she is doing well from the surgery. Patient states she has completed her therapy. Patient denies having any problems since her surgery. States she does not have to use a cane or walker and is functioning independently. Patient states she has support from her husband. Patient states she is able to get to her doctor appointments. States she has had the necessary follow up appointments with her doctors. Patient states is not having any problems with her medications. Patient states she has neuropathy in her legs and osteoarthritis in her back.  Patient states she has had these problems for years and she is managing them.  Patient verbally agreed to receive [facility] care management outreach letter and brochure.  PLAN;RNCM will refer patient to case management assistant to close due to refusal of services.  RNCM will notify patients primary MD of closure RNCM will send patient [facility] care management outreach letter and brochure as discussed.  Quinn Plowman RN,BSN,CCM [facility] Telephonic  [phone]

## 2016-11-06 DIAGNOSIS — M1611 Unilateral primary osteoarthritis, right hip: Secondary | ICD-10-CM | POA: Diagnosis not present

## 2016-12-05 ENCOUNTER — Encounter (INDEPENDENT_AMBULATORY_CARE_PROVIDER_SITE_OTHER): Payer: Self-pay

## 2016-12-05 ENCOUNTER — Ambulatory Visit (INDEPENDENT_AMBULATORY_CARE_PROVIDER_SITE_OTHER): Payer: PPO | Admitting: Cardiology

## 2016-12-05 VITALS — BP 128/78 | HR 83 | Ht 68.0 in | Wt 154.0 lb

## 2016-12-05 DIAGNOSIS — J019 Acute sinusitis, unspecified: Secondary | ICD-10-CM | POA: Insufficient documentation

## 2016-12-05 DIAGNOSIS — IMO0001 Reserved for inherently not codable concepts without codable children: Secondary | ICD-10-CM

## 2016-12-05 DIAGNOSIS — J018 Other acute sinusitis: Secondary | ICD-10-CM

## 2016-12-05 DIAGNOSIS — I739 Peripheral vascular disease, unspecified: Secondary | ICD-10-CM

## 2016-12-05 DIAGNOSIS — F17200 Nicotine dependence, unspecified, uncomplicated: Secondary | ICD-10-CM

## 2016-12-05 DIAGNOSIS — I2510 Atherosclerotic heart disease of native coronary artery without angina pectoris: Secondary | ICD-10-CM

## 2016-12-05 DIAGNOSIS — I2583 Coronary atherosclerosis due to lipid rich plaque: Secondary | ICD-10-CM | POA: Diagnosis not present

## 2016-12-05 DIAGNOSIS — E784 Other hyperlipidemia: Secondary | ICD-10-CM

## 2016-12-05 DIAGNOSIS — I493 Ventricular premature depolarization: Secondary | ICD-10-CM

## 2016-12-05 DIAGNOSIS — E7849 Other hyperlipidemia: Secondary | ICD-10-CM

## 2016-12-05 DIAGNOSIS — I5032 Chronic diastolic (congestive) heart failure: Secondary | ICD-10-CM

## 2016-12-05 DIAGNOSIS — I10 Essential (primary) hypertension: Secondary | ICD-10-CM | POA: Diagnosis not present

## 2016-12-05 MED ORDER — AZITHROMYCIN 250 MG PO TABS: ORAL_TABLET | ORAL | 0 refills | Status: DC

## 2016-12-05 MED ORDER — ASPIRIN EC 81 MG PO TBEC: 81.0000 mg | DELAYED_RELEASE_TABLET | Freq: Every day | ORAL | 3 refills | Status: DC

## 2016-12-05 NOTE — Progress Notes (Signed)
Patient ID: DEVIKA DRAGOVICH, female   DOB: 11/28/1950, 66 y.o.   MRN: [id number]    Patient Name: Marie Johnson Date of Encounter: 12/05/2016  Primary Care Provider:  Leonides Sake, MD Primary Cardiologist:  Ena Dawley  Chief complain: 6 months follow up for DOE and claudications  Problem List   Past Medical History:  Diagnosis Date  . AAA (abdominal aortic aneurysm) (HCC)    3.4 cm  . Anxiety   . Aortic atherosclerosis ([place])   . Arthritis   . Asthma   . CAD (coronary artery disease)    a. Nonobstructive CAD by cath 07/17/13.  . CHD (congenital heart disease)    pt unaware???  . Chest pain    a. Adm 10-07/2013 - CTA neg for PE/dissection, cath with nonobstructive disease, CP ?due to uncontrolled HTN vs coronary vasospasm.  [name] COPD (chronic obstructive pulmonary disease) ([place])   . GERD (gastroesophageal reflux disease)   . Hyperlipidemia   . Hypertension   . Neuropathy ([place])   . Osteoporosis   . PAD (peripheral artery disease) ([place])    a. s/p L fem-pop bypass b. recent evaluation at [facility], unamenable to intervention  . Premature ventricular contraction   . PUD (peptic ulcer disease)   . Tobacco abuse    Past Surgical History:  Procedure Laterality Date  . ABDOMINAL HYSTERECTOMY    . CARDIAC CATHETERIZATION     several years ago, nonobstructive, "50-60% blockages"  . HIP SURGERY     left - bars and screws placed  . LEFT HEART CATHETERIZATION WITH CORONARY ANGIOGRAM N/A 07/17/2013   Procedure: LEFT HEART CATHETERIZATION WITH CORONARY ANGIOGRAM;  Surgeon: Larey Dresser, MD;  Location: [facility] CATH LAB;  Service: Cardiovascular;  Laterality: N/A;  . PERIPHERAL VASCULAR CATHETERIZATION N/A 07/16/2016   Procedure: Abdominal Aortogram w/Lower Extremity;  Surgeon: Rosetta Posner, MD;  Location: [place] CV LAB;  Service: Cardiovascular;  Laterality: N/A;  . ROTATOR CUFF REPAIR     right side, had torn ligaments as well  . TUBAL LIGATION    . VEIN BYPASS SURGERY     left  leg  . WRIST ARTHROCENTESIS     left - broke in 3 places so has bars and screws placed   Allergies  Allergies  Allergen Reactions  . Bee Venom Anaphylaxis  . Latex Rash   HPI  66 year old female, older appearing, lifelong smoker with prior medical history of hypertension, PAD who was recently evaluated for an abdominal hernia. There was an incidental finding of significant coronary calcifications on the CT and she was referred to [place] for further evaluation.  The patient states that she has exertional dyspnea and chest tightness with minimal exertion. As an example she uses this walking one flight of stairs when she has to stop and it resolves within a few minutes. She denies any resting chest pain. Denies any palpitations or syncope. The pain feels pressure-like retrosternal with no radiation. She did have a cardiac cath by Odis Hollingshead many years ago with findings of non-obstructive coronary artery disease at that time.  There was 3.4 cm abdominal aortic aneurysm on her CT that is known for many years. The patient is a lifelong smoker, currently smoking one and one half packs per day. She has a h/o left femoropopliteal bypass by Dr. Donnetta Hutching 15 years ago. She complains of bilateral claudications left worse than right that start after walking minimal distances. She underwent arterial Doppler ultrasound bilaterally and lower extremity angiogram at [facility].  She was talled that she had severe diffuse peripheral arterial disease, however her lesions are distal and her vessels are small for interventions.  The patient had a negative stress test on 07/16/2013, however continuous chest pain. Therefore she underwent a cardiac catheterization on 07/17/13 wirth findings of non-obstructive CAD. The patient was started on amlodipine for HTN and possible vasospasm. Today she is coming and reports significant improvement of symptoms. No chest pain and improved SOB. She has cut down on smoking  cigarettes.  02/22/2016 - the patient is coming after one year, she has noticed that she has no energy, with walking she has been experiencing blurry vision, and dizziness. She is also noticed worsening palpitations that can last several minutes and are associated with lightheadedness and shortness of breath. She denies any chest pain. She is compliant with her medicines. She is complaining of lower extremity cramping, cold legs and claudications. Denies any syncope. She continues to smoke. Denies orthopnea or paroxysmal nocturnal dyspnea.  05/23/2016, this is a 3 months follow-up, in the meantime the patient was seen by Dr. Rayann Heman. She underwent 48 hour Holter monitoring that showed 41,000 PVCs she was started on verapamil with good response with decreased amount of PVCs and improved symptoms. She is not considered an ablation candidate because of ongoing smoking and severe COPD. She states that she overall feels better but has severe constipation secondary to verapamil and claims that oxycodone cannot do that to her as she has been taking it for a while. She denies any presyncope or syncope. No chest pain, stable dyspnea on exertion that she attributes to COPD. She has developed significant claudications they're currently at rest and worse at night. No lower extremity edema.  12/05/2016 - this is a 6 months follow-up, today patient states that she doesn't have significant chest pain she is stable dyspnea on exertion however her major complaint today are chills mild fever headache nosebleeds from her right nostril and right maxillar pain. She has seen Dr. 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 The left internal iliac artery was occluded at its origin and reconstituted via collaterals. The right internal iliac artery was patent. The catheter was then withdrawn down  below the level the renal arteries and runoff was obtained. On the right the common femoral artery was patent. The superficial femoral artery was occluded and there was reconstitution of the right above-knee popliteal artery. There was runoff via the anterior tibial and peroneal arteries on the right. On the left patient had a prior placed left femoral-popliteal bypass which was to the above-knee position. This was widely patent with no evidence of graft stenosis. There was two-vessel runoff. The anterior tibial and peroneal arteries. No intervention was performed. The patient continues to experience claudications and bluish discoloration of her lower extremities that is worsening.  Home Medications  Prior to Admission medications   Medication Sig Start Date End Date Taking? Authorizing Provider  atenolol (TENORMIN) 50 MG tablet 50 mg 2 (two) times daily.  06/09/13  Yes Historical Provider, MD  furosemide (LASIX) 40 MG tablet  06/09/13  Yes Historical Provider, MD  LORazepam (ATIVAN) 1 MG tablet  04/22/13  Yes Historical Provider, MD  losartan (COZAAR) 25 MG tablet  05/26/13  Yes Historical Provider, MD  NITROSTAT 0.4 MG SL tablet  06/24/13  Yes Historical Provider, MD  Oxycodone HCl 10 MG TABS  06/08/13  Yes Historical  Provider, MD  pantoprazole (PROTONIX) 40 MG tablet  06/09/13  Yes Historical Provider, MD  pravastatin (PRAVACHOL) 40 MG tablet  05/27/13  Yes Historical Provider, MD  PROAIR HFA 108 (90 BASE) MCG/ACT inhaler  06/08/13  Yes Historical Provider, MD  traMADol Janean Sark) 50 MG tablet  06/14/13  Yes Historical Provider, MD   Family History  Family History  Problem Relation Age of Onset  . Cervical cancer Mother   . Heart disease Mother     MI in 63s  . Liver cancer Father   . Heart disease Father   . Lung cancer Maternal Aunt   . Prostate cancer Maternal Uncle     met. anal  . Breast cancer Maternal Grandmother   . Lung cancer Maternal Aunt   . Lung cancer Maternal Aunt   . Stomach  cancer Maternal Aunt   . Colon cancer Maternal Aunt   . Lung cancer Maternal Uncle     Social History  Social History   Social History  . Marital status: Married    Spouse name: N/A  . Number of children: N/A  . Years of education: N/A   Occupational History  . Not on file.   Social History Main Topics  . Smoking status: Current Every Day Smoker    Packs/day: 1.00    Types: Cigarettes  . Smokeless tobacco: Never Used  . Alcohol use No  . Drug use: No  . Sexual activity: Not on file   Other Topics Concern  . Not on file   Social History Narrative   Pt lives in [place] with husband.  Retired Child psychotherapist     Review of Systems General:  No chills, fever, night sweats or weight changes.  Cardiovascular:  No chest pain, dyspnea on exertion, edema, orthopnea, palpitations, paroxysmal nocturnal dyspnea. Dermatological: No rash, lesions/masses Respiratory: No cough, dyspnea Urologic: No hematuria, dysuria Abdominal:   No nausea, vomiting, diarrhea, bright red blood per rectum, melena, or hematemesis Neurologic:  No visual changes, wkns, changes in mental status. All other systems reviewed and are otherwise negative except as noted above.  Physical Exam  BP 128/78 mmHg, HR 83 BPM General: Older appearang, smelling like cigaretes, NAD Psych: Normal affect. Neuro: Alert and oriented X 3. Moves all extremities spontaneously. HEENT: Normal  Neck: Supple without bruits or JVD. Lungs:  Resp regular and unlabored, CTA Heart: RRR no s3, s4, or murmurs. Abdomen: Soft, non-tender, non-distended, BS + x 4.  Extremities: No clubbing, cyanosis, no lower extremity edema. Radials 2+ LE cold, no pulses  DP/PT/ bilaterally.  Accessory Clinical Findings  ECG - personally reviewed, shows sinus rhythm with occasional PVCs, this is unchanged from prior EKG.  Left heart cath: 07/17/13 Hemodynamics:  AO 150/65  LV 159/17  Coronary angiography:  Coronary dominance: right  Left  mainstem: Very mild distal LM tapering.  Left anterior descending (LAD): Heavy proximal LAD calcification. 30-40% stenosis in the proximal LAD at the take-off of a small 2nd diagonal.  Left circumflex (LCx): Medium-sized ramus without signficant disease. AV LCx with 30-40% ostial stenosis. OM1 with 30% ostial stenosis.  Right coronary artery (RCA): There was prominent proximal to mid RCA calcification. 30% proximal, 30% mid RCA stenosis.  Left ventriculography: Not done to conserve contrast. Normal EF on recent stress Cardiolite.  Final Conclusions: Nonobstructive CAD. I do not have an explanation for her chest pain. No PE or dissection on chest CT. I will keep her overnight to control her blood pressure. Start amlodipine for BP  control and in case this could be coronary vasospasm. Will keep on NTG gtt for now while BP is still quite high.  Loralie Champagne  07/17/2013, 4:19 PM     Assessment & Plan  66 year old female lifelong smoker, older appearing  1. Acute sinusitis - we'll prescribe Z-Pak, increase aspirin 325 -> 81 mg daily. She is advised to use saline spray to keep her nose moisture. She is also advised that if her symptoms don't improve by next week she is supposed to follow with her primary care physician.  2. Frequent PVCs - 41,048 hour Holter monitor, high risk patient for ablation secondary to severe COPD. she had very good response to verapamil with minimal palpitations.   3.  Known nonobstructive CAD in all 3 vessels In 2014,  a dobutamine stress echo in 03/2016 showed Normal LV function, no ischemia.  Continue aspirin, atorvastatin. She didn't tolerate beta blockers. Again advised on discontinuation of smoking.  4. Chronic CHF with preserved left ventricular ejection fraction - she appears euvolemic.   5. Hypertension - controlled.  6. Hyperlipidemia - on atorvastatin 80 mg daily, cramping have resolved, her most recent LFTs in June 2017 were normal. Her LDL currently 87,  triglycerides 82 and HDL 51 all at goal.  7. PAD - poor peripheral pulses, followed by Dr. Sherren Mocha Early as described above, she has worsening claudications, she is strongly advised to quit smoking, also follow with Dr Donnetta Hutching.  Follow-up in 6 months.   Ena Dawley, MD 12/05/2016, 8:35 AM

## 2016-12-05 NOTE — Patient Instructions (Signed)
Medication Instructions:   START TAKING Z-PAK 250 MG TABS--YOU WILL TAKE 2 TABLETS BY MOUTH TODAY AS YOUR 1ST INITIAL DOSE, THEN YOU WILL TAKE 1 TABLET BY MOUTH DAILY THEREAFTER, UNTIL YOUR PACK IS COMPLETE   DECREASE YOUR ASPIRIN TO 31 MG BY MOUTH DAILY     Follow-Up:  Your physician wants you to follow-up in: [place] will receive a reminder letter in the mail two months in advance. If you don't receive a letter, please call our office to schedule the follow-up appointment.        If you need a refill on your cardiac medications before your next appointment, please call your pharmacy.

## 2016-12-20 DIAGNOSIS — M109 Gout, unspecified: Secondary | ICD-10-CM | POA: Diagnosis not present

## 2016-12-20 DIAGNOSIS — J019 Acute sinusitis, unspecified: Secondary | ICD-10-CM | POA: Diagnosis not present

## 2016-12-20 DIAGNOSIS — Z79899 Other long term (current) drug therapy: Secondary | ICD-10-CM | POA: Diagnosis not present

## 2016-12-20 DIAGNOSIS — I5032 Chronic diastolic (congestive) heart failure: Secondary | ICD-10-CM | POA: Diagnosis not present

## 2016-12-20 DIAGNOSIS — I739 Peripheral vascular disease, unspecified: Secondary | ICD-10-CM | POA: Diagnosis not present

## 2016-12-20 DIAGNOSIS — E7800 Pure hypercholesterolemia, unspecified: Secondary | ICD-10-CM | POA: Diagnosis not present

## 2016-12-20 DIAGNOSIS — E559 Vitamin D deficiency, unspecified: Secondary | ICD-10-CM | POA: Diagnosis not present

## 2016-12-20 DIAGNOSIS — R7303 Prediabetes: Secondary | ICD-10-CM | POA: Diagnosis not present

## 2016-12-20 DIAGNOSIS — E871 Hypo-osmolality and hyponatremia: Secondary | ICD-10-CM | POA: Diagnosis not present

## 2016-12-20 DIAGNOSIS — K219 Gastro-esophageal reflux disease without esophagitis: Secondary | ICD-10-CM | POA: Diagnosis not present

## 2016-12-20 DIAGNOSIS — E663 Overweight: Secondary | ICD-10-CM | POA: Diagnosis not present

## 2016-12-20 DIAGNOSIS — I10 Essential (primary) hypertension: Secondary | ICD-10-CM | POA: Diagnosis not present

## 2016-12-20 DIAGNOSIS — Z720 Tobacco use: Secondary | ICD-10-CM | POA: Diagnosis not present

## 2017-01-15 ENCOUNTER — Other Ambulatory Visit: Payer: Self-pay | Admitting: Cardiology

## 2017-02-05 DIAGNOSIS — M1611 Unilateral primary osteoarthritis, right hip: Secondary | ICD-10-CM | POA: Diagnosis not present

## 2017-02-20 ENCOUNTER — Other Ambulatory Visit: Payer: Self-pay | Admitting: Cardiology

## 2017-02-20 DIAGNOSIS — I739 Peripheral vascular disease, unspecified: Secondary | ICD-10-CM

## 2017-02-20 DIAGNOSIS — I10 Essential (primary) hypertension: Secondary | ICD-10-CM

## 2017-02-20 MED ORDER — AMLODIPINE BESYLATE 2.5 MG PO TABS: 2.5000 mg | ORAL_TABLET | Freq: Every day | ORAL | 2 refills | Status: DC

## 2017-03-22 DIAGNOSIS — R7989 Other specified abnormal findings of blood chemistry: Secondary | ICD-10-CM | POA: Diagnosis not present

## 2017-03-22 DIAGNOSIS — Z9181 History of falling: Secondary | ICD-10-CM | POA: Diagnosis not present

## 2017-03-22 DIAGNOSIS — I10 Essential (primary) hypertension: Secondary | ICD-10-CM | POA: Diagnosis not present

## 2017-03-22 DIAGNOSIS — E559 Vitamin D deficiency, unspecified: Secondary | ICD-10-CM | POA: Diagnosis not present

## 2017-03-22 DIAGNOSIS — M1990 Unspecified osteoarthritis, unspecified site: Secondary | ICD-10-CM | POA: Diagnosis not present

## 2017-03-22 DIAGNOSIS — E7800 Pure hypercholesterolemia, unspecified: Secondary | ICD-10-CM | POA: Diagnosis not present

## 2017-03-22 DIAGNOSIS — Z79899 Other long term (current) drug therapy: Secondary | ICD-10-CM | POA: Diagnosis not present

## 2017-03-22 DIAGNOSIS — M109 Gout, unspecified: Secondary | ICD-10-CM | POA: Diagnosis not present

## 2017-06-13 DIAGNOSIS — J208 Acute bronchitis due to other specified organisms: Secondary | ICD-10-CM | POA: Diagnosis not present

## 2017-06-13 DIAGNOSIS — I5032 Chronic diastolic (congestive) heart failure: Secondary | ICD-10-CM | POA: Diagnosis not present

## 2017-06-13 DIAGNOSIS — J441 Chronic obstructive pulmonary disease with (acute) exacerbation: Secondary | ICD-10-CM | POA: Diagnosis not present

## 2017-06-13 DIAGNOSIS — Z6821 Body mass index (BMI) 21.0-21.9, adult: Secondary | ICD-10-CM | POA: Diagnosis not present

## 2017-07-23 ENCOUNTER — Other Ambulatory Visit: Payer: Self-pay

## 2017-07-23 DIAGNOSIS — Z79899 Other long term (current) drug therapy: Secondary | ICD-10-CM | POA: Diagnosis not present

## 2017-07-23 DIAGNOSIS — I10 Essential (primary) hypertension: Secondary | ICD-10-CM | POA: Diagnosis not present

## 2017-07-23 DIAGNOSIS — M109 Gout, unspecified: Secondary | ICD-10-CM | POA: Diagnosis not present

## 2017-07-23 DIAGNOSIS — M1990 Unspecified osteoarthritis, unspecified site: Secondary | ICD-10-CM | POA: Diagnosis not present

## 2017-07-23 DIAGNOSIS — B029 Zoster without complications: Secondary | ICD-10-CM | POA: Diagnosis not present

## 2017-07-23 DIAGNOSIS — I5032 Chronic diastolic (congestive) heart failure: Secondary | ICD-10-CM | POA: Diagnosis not present

## 2017-07-23 DIAGNOSIS — E559 Vitamin D deficiency, unspecified: Secondary | ICD-10-CM | POA: Diagnosis not present

## 2017-07-23 DIAGNOSIS — Z139 Encounter for screening, unspecified: Secondary | ICD-10-CM | POA: Diagnosis not present

## 2017-07-23 DIAGNOSIS — Z6822 Body mass index (BMI) 22.0-22.9, adult: Secondary | ICD-10-CM | POA: Diagnosis not present

## 2017-07-23 DIAGNOSIS — E7800 Pure hypercholesterolemia, unspecified: Secondary | ICD-10-CM | POA: Diagnosis not present

## 2017-07-23 MED ORDER — VERAPAMIL HCL ER 180 MG PO TBCR: 180.0000 mg | EXTENDED_RELEASE_TABLET | Freq: Every day | ORAL | 3 refills | Status: DC

## 2017-08-30 DIAGNOSIS — Z1231 Encounter for screening mammogram for malignant neoplasm of breast: Secondary | ICD-10-CM | POA: Diagnosis not present

## 2017-08-30 DIAGNOSIS — Z6825 Body mass index (BMI) 25.0-25.9, adult: Secondary | ICD-10-CM | POA: Diagnosis not present

## 2017-08-30 DIAGNOSIS — Z01419 Encounter for gynecological examination (general) (routine) without abnormal findings: Secondary | ICD-10-CM | POA: Diagnosis not present

## 2017-09-03 ENCOUNTER — Ambulatory Visit: Payer: PPO | Admitting: Cardiology

## 2017-09-03 ENCOUNTER — Encounter (INDEPENDENT_AMBULATORY_CARE_PROVIDER_SITE_OTHER): Payer: Self-pay

## 2017-09-03 DIAGNOSIS — M791 Myalgia, unspecified site: Secondary | ICD-10-CM | POA: Diagnosis not present

## 2017-09-03 DIAGNOSIS — I493 Ventricular premature depolarization: Secondary | ICD-10-CM

## 2017-09-03 DIAGNOSIS — Z720 Tobacco use: Secondary | ICD-10-CM | POA: Diagnosis not present

## 2017-09-03 DIAGNOSIS — I10 Essential (primary) hypertension: Secondary | ICD-10-CM | POA: Diagnosis not present

## 2017-09-03 DIAGNOSIS — I2510 Atherosclerotic heart disease of native coronary artery without angina pectoris: Secondary | ICD-10-CM | POA: Diagnosis not present

## 2017-09-03 DIAGNOSIS — I739 Peripheral vascular disease, unspecified: Secondary | ICD-10-CM | POA: Diagnosis not present

## 2017-09-03 DIAGNOSIS — E7849 Other hyperlipidemia: Secondary | ICD-10-CM

## 2017-09-03 MED ORDER — AMLODIPINE BESYLATE 2.5 MG PO TABS: 2.5000 mg | ORAL_TABLET | Freq: Every day | ORAL | 2 refills | Status: DC

## 2017-09-03 MED ORDER — POTASSIUM CHLORIDE CRYS ER 20 MEQ PO TBCR: 20.0000 meq | EXTENDED_RELEASE_TABLET | Freq: Every day | ORAL | 3 refills | Status: DC

## 2017-09-03 MED ORDER — ATORVASTATIN CALCIUM 80 MG PO TABS: 80.0000 mg | ORAL_TABLET | Freq: Every day | ORAL | 3 refills | Status: DC

## 2017-09-03 MED ORDER — VERAPAMIL HCL ER 180 MG PO TBCR: 180.0000 mg | EXTENDED_RELEASE_TABLET | Freq: Every day | ORAL | 3 refills | Status: DC

## 2017-09-03 MED ORDER — SPIRONOLACTONE 25 MG PO TABS: 25.0000 mg | ORAL_TABLET | Freq: Every day | ORAL | 3 refills | Status: DC

## 2017-09-03 MED ORDER — FUROSEMIDE 20 MG PO TABS: 20.0000 mg | ORAL_TABLET | Freq: Every day | ORAL | 3 refills | Status: DC

## 2017-09-03 NOTE — Progress Notes (Signed)
Patient ID: CHERRELLE PLANTE, female   DOB: 1951-01-06, 66 y.o.   MRN: [id number]    Patient Name: Marie Johnson Date of Encounter: 09/03/2017  Primary Care Provider:  Leonides Sake, MD Primary Cardiologist:  Ena Dawley  Chief complain: 9 months follow up for DOE and claudications  Problem List   Past Medical History:  Diagnosis Date  . AAA (abdominal aortic aneurysm) (HCC)    3.4 cm  . Anxiety   . Aortic atherosclerosis ([place])   . Arthritis   . Asthma   . CAD (coronary artery disease)    a. Nonobstructive CAD by cath 07/17/13.  . CHD (congenital heart disease)    pt unaware???  . Chest pain    a. Adm 10-07/2013 - CTA neg for PE/dissection, cath with nonobstructive disease, CP ?due to uncontrolled HTN vs coronary vasospasm.  [name] COPD (chronic obstructive pulmonary disease) ([place])   . GERD (gastroesophageal reflux disease)   . Hyperlipidemia   . Hypertension   . Neuropathy ([place])   . Osteoporosis   . PAD (peripheral artery disease) ([place])    a. s/p L fem-pop bypass b. recent evaluation at [facility], unamenable to intervention  . Premature ventricular contraction   . PUD (peptic ulcer disease)   . Tobacco abuse    Past Surgical History:  Procedure Laterality Date  . ABDOMINAL HYSTERECTOMY    . CARDIAC CATHETERIZATION     several years ago, nonobstructive, "50-60% blockages"  . HIP SURGERY     left - bars and screws placed  . LEFT HEART CATHETERIZATION WITH CORONARY ANGIOGRAM N/A 07/17/2013   Procedure: LEFT HEART CATHETERIZATION WITH CORONARY ANGIOGRAM;  Surgeon: Larey Dresser, MD;  Location: [facility] CATH LAB;  Service: Cardiovascular;  Laterality: N/A;  . PERIPHERAL VASCULAR CATHETERIZATION N/A 07/16/2016   Procedure: Abdominal Aortogram w/Lower Extremity;  Surgeon: Rosetta Posner, MD;  Location: [facility] CV LAB;  Service: Cardiovascular;  Laterality: N/A;  . ROTATOR CUFF REPAIR     right side, had torn ligaments as well  . TUBAL LIGATION    . VEIN BYPASS SURGERY     left leg  . WRIST ARTHROCENTESIS     left - broke in 3 places so has bars and screws placed   Allergies  Allergies  Allergen Reactions  . Bee Venom Anaphylaxis  . Latex Rash   HPI  66 year old female, older appearing, lifelong smoker with prior medical history of hypertension, PAD who was recently evaluated for an abdominal hernia. There was an incidental finding of significant coronary calcifications on the CT and she was referred to [place] for further evaluation.  The patient states that she has exertional dyspnea and chest tightness with minimal exertion. As an example she uses this walking one flight of stairs when she has to stop and it resolves within a few minutes. She denies any resting chest pain. Denies any palpitations or syncope. The pain feels pressure-like retrosternal with no radiation. She did have a cardiac cath by Odis Hollingshead many years ago with findings of non-obstructive coronary artery disease at that time.  There was 3.4 cm abdominal aortic aneurysm on her CT that is known for many years. The patient is a lifelong smoker, currently smoking one and one half packs per day. She has a h/o left femoropopliteal bypass by Dr. Donnetta Hutching 15 years ago. She complains of bilateral claudications left worse than right that start after walking minimal distances. She underwent arterial Doppler ultrasound bilaterally and lower extremity angiogram at [facility].  She was talled that she had severe diffuse peripheral arterial disease, however her lesions are distal and her vessels are small for interventions.  The patient had a negative stress test on 07/16/2013, however continuous chest pain. Therefore she underwent a cardiac catheterization on 07/17/13 wirth findings of non-obstructive CAD. The patient was started on amlodipine for HTN and possible vasospasm. Today she is coming and reports significant improvement of symptoms. No chest pain and improved SOB. She has cut down on smoking  cigarettes.  02/22/2016 - the patient is coming after one year, she has noticed that she has no energy, with walking she has been experiencing blurry vision, and dizziness. She is also noticed worsening palpitations that can last several minutes and are associated with lightheadedness and shortness of breath. She denies any chest pain. She is compliant with her medicines. She is complaining of lower extremity cramping, cold legs and claudications. Denies any syncope. She continues to smoke. Denies orthopnea or paroxysmal nocturnal dyspnea.  05/23/2016, this is a 3 months follow-up, in the meantime the patient was seen by Dr. Rayann Heman. She underwent 48 hour Holter monitoring that showed 41,000 PVCs she was started on verapamil with good response with decreased amount of PVCs and improved symptoms. She is not considered an ablation candidate because of ongoing smoking and severe COPD. She states that she overall feels better but has severe constipation secondary to verapamil and claims that oxycodone cannot do that to her as she has been taking it for a while. She denies any presyncope or syncope. No chest pain, stable dyspnea on exertion that she attributes to COPD. She has developed significant claudications they're currently at rest and worse at night. No lower extremity edema.  12/05/2016 - this is a 6 months follow-up, today patient states that she doesn't have significant chest pain she is stable dyspnea on exertion however her major complaint today are chills mild fever headache nosebleeds from her right nostril and right maxillar pain. She has seen Dr. 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 The left internal iliac artery was occluded at its origin and reconstituted via collaterals. The right internal iliac artery was patent. The catheter was then withdrawn down  below the level the renal arteries and runoff was obtained. On the right the common femoral artery was patent. The superficial femoral artery was occluded and there was reconstitution of the right above-knee popliteal artery. There was runoff via the anterior tibial and peroneal arteries on the right. On the left patient had a prior placed left femoral-popliteal bypass which was to the above-knee position. This was widely patent with no evidence of graft stenosis. There was two-vessel runoff. The anterior tibial and peroneal arteries. No intervention was performed. The patient continues to experience claudications and bluish discoloration of her lower extremities that is worsening.  09/03/2017 - 9 months follow-up, the patient has had difficult year, her husband was injured and had 3 knee surgeries and she is his caregiver taking care of their household. She has lost 10 pounds since the last visit. She also had pneumonia about 2 months ago with pleuritic chest pain that has resolved after she is treated for pneumonia. No exertional chest pain. Stable dyspnea on exertion but new sink is profound fatigue. She is complaining of overall muscle pain and joint pain mostly in her lower extremities but also in her back and her arms. She continues to  have claudications after short distance, followed by Dr. early but not amenable to intervention.  Home Medications  Prior to Admission medications   Medication Sig Start Date End Date Taking? Authorizing Provider  atenolol (TENORMIN) 50 MG tablet 50 mg 2 (two) times daily.  06/09/13  Yes Historical Provider, MD  furosemide (LASIX) 40 MG tablet  06/09/13  Yes Historical Provider, MD  LORazepam (ATIVAN) 1 MG tablet  04/22/13  Yes Historical Provider, MD  losartan (COZAAR) 25 MG tablet  05/26/13  Yes Historical Provider, MD  NITROSTAT 0.4 MG SL tablet  06/24/13  Yes Historical Provider, MD  Oxycodone HCl 10 MG TABS  06/08/13  Yes Historical Provider, MD  pantoprazole  (PROTONIX) 40 MG tablet  06/09/13  Yes Historical Provider, MD  pravastatin (PRAVACHOL) 40 MG tablet  05/27/13  Yes Historical Provider, MD  PROAIR HFA 108 (90 BASE) MCG/ACT inhaler  06/08/13  Yes Historical Provider, MD  traMADol Veatrice Bourbon) 50 MG tablet  06/14/13  Yes Historical Provider, MD   Family History  Family History  Problem Relation Age of Onset  . Cervical cancer Mother   . Heart disease Mother        MI in 8s  . Liver cancer Father   . Heart disease Father   . Lung cancer Maternal Aunt   . Prostate cancer Maternal Uncle        met. anal  . Breast cancer Maternal Grandmother   . Lung cancer Maternal Aunt   . Lung cancer Maternal Aunt   . Stomach cancer Maternal Aunt   . Colon cancer Maternal Aunt   . Lung cancer Maternal Uncle     Social History  Social History   Socioeconomic History  . Marital status: Married    Spouse name: Not on file  . Number of children: Not on file  . Years of education: Not on file  . Highest education level: Not on file  Social Needs  . Financial resource strain: Not on file  . Food insecurity - worry: Not on file  . Food insecurity - inability: Not on file  . Transportation needs - medical: Not on file  . Transportation needs - non-medical: Not on file  Occupational History  . Not on file  Tobacco Use  . Smoking status: Current Every Day Smoker    Packs/day: 1.00    Types: Cigarettes  . Smokeless tobacco: Never Used  Substance and Sexual Activity  . Alcohol use: No    Alcohol/week: 0.0 oz  . Drug use: No  . Sexual activity: Not on file  Other Topics Concern  . Not on file  Social History Narrative   Pt lives in [place] with husband.  Retired Educational psychologist    Review of Systems General:  No chills, fever, night sweats or weight changes.  Cardiovascular:  No chest pain, dyspnea on exertion, edema, orthopnea, palpitations, paroxysmal nocturnal dyspnea. Dermatological: No rash, lesions/masses Respiratory: No cough,  dyspnea Urologic: No hematuria, dysuria Abdominal:   No nausea, vomiting, diarrhea, bright red blood per rectum, melena, or hematemesis Neurologic:  No visual changes, wkns, changes in mental status. All other systems reviewed and are otherwise negative except as noted above.  Physical Exam  BP 128/78 mmHg, HR 83 BPM General: Older appearang, smelling like cigaretes, NAD Psych: Normal affect. Neuro: Alert and oriented X 3. Moves all extremities spontaneously. HEENT: Normal  Neck: Supple without bruits or JVD. Lungs:  Resp regular and unlabored, CTA Heart: RRR no s3, s4, or murmurs.  Abdomen: Soft, non-tender, non-distended, BS + x 4.  Extremities: No clubbing, cyanosis, mild lower extremity edema in LLE. Radials 2+ LE cold, no pulses  DP/PT/ bilaterally.  Accessory Clinical Findings  ECG -performed today 09/03/2017, personally reviewed, shows sinus rhythm with occasional PVCs, this is unchanged from prior EKG.  Left heart cath: 07/17/13 Hemodynamics:  AO 150/65  LV 159/17  Coronary angiography:  Coronary dominance: right  Left mainstem: Very mild distal LM tapering.  Left anterior descending (LAD): Heavy proximal LAD calcification. 30-40% stenosis in the proximal LAD at the take-off of a small 2nd diagonal.  Left circumflex (LCx): Medium-sized ramus without signficant disease. AV LCx with 30-40% ostial stenosis. OM1 with 30% ostial stenosis.  Right coronary artery (RCA): There was prominent proximal to mid RCA calcification. 30% proximal, 30% mid RCA stenosis.  Left ventriculography: Not done to conserve contrast. Normal EF on recent stress Cardiolite.  Final Conclusions: Nonobstructive CAD. I do not have an explanation for her chest pain. No PE or dissection on chest CT. I will keep her overnight to control her blood pressure. Start amlodipine for BP control and in case this could be coronary vasospasm. Will keep on NTG gtt for now while BP is still quite high.  Loralie Champagne   07/17/2013, 4:19 PM     Assessment & Plan  1. Frequent PVCs - 41,048 hour Holter monitor, high risk patient for ablation secondary to severe COPD. she had very good response to verapamil with minimal palpitations. I would continue.  2.  Known nonobstructive CAD in all 3 vessels In 2014,  a dobutamine stress echo in 03/2016 showed Normal LV function, no ischemia. She has been asymptomatic. Continue aspirin. She didn't tolerate beta blockers. Again advised on discontinuation of smoking. She is significant myalgias possibly secondary to PAD, however we will give it a trial of holding atorvastatin for week if no improvement she will restart, if there is improvement we will start rosuvastatin 5 mg daily. Avoid pravastatin as that was causing significant myalgias in the past.  3. Chronic CHF with preserved left ventricular ejection fraction - she appears euvolemic. Continue low-dose Lasix and spironolactone, labs done at her PCP a months ago we'll obtain results.  5. Hypertension - controlled.  6. Hyperlipidemia - on atorvastatin 80 mg daily, as above, lipids all at goal a year ago, she had lipids recently at her primary care doctor will get the results.  7. PAD - poor peripheral pulses, followed by Dr. Sherren Mocha Early as described above, she has worsening claudications, she is strongly advised to quit smoking, unfortunately not amenable for intervention.  Follow-up in 6 months. Provide refills.   Ena Dawley, MD 09/03/2017, 8:37 AM

## 2017-09-03 NOTE — Patient Instructions (Signed)
Medication Instructions:   HOLD YOUR LIPITOR (ATORVASTATIN) FOR ONE WEEK ONLY, THEN CALL THE OFFICE BACK TO REPORT IF YOUR SYMPTOMS IMPROVE OR NOT    Follow-Up:  Your physician wants you to follow-up in: [place] will receive a reminder letter in the mail two months in advance. If you don't receive a letter, please call our office to schedule the follow-up appointment.        If you need a refill on your cardiac medications before your next appointment, please call your pharmacy.

## 2018-03-31 ENCOUNTER — Other Ambulatory Visit: Payer: Self-pay

## 2018-03-31 DIAGNOSIS — I83813 Varicose veins of bilateral lower extremities with pain: Secondary | ICD-10-CM

## 2018-03-31 DIAGNOSIS — I739 Peripheral vascular disease, unspecified: Secondary | ICD-10-CM

## 2018-04-30 ENCOUNTER — Encounter: Payer: PPO | Admitting: Vascular Surgery

## 2018-04-30 ENCOUNTER — Encounter (HOSPITAL_COMMUNITY): Payer: PPO

## 2018-05-22 ENCOUNTER — Ambulatory Visit: Payer: Medicare Other | Admitting: Cardiology

## 2018-05-22 ENCOUNTER — Encounter: Payer: Self-pay | Admitting: Cardiology

## 2018-05-22 ENCOUNTER — Encounter

## 2018-05-22 ENCOUNTER — Telehealth: Payer: Self-pay | Admitting: Cardiology

## 2018-05-22 VITALS — BP 140/82 | HR 72 | Ht 68.0 in | Wt 146.8 lb

## 2018-05-22 DIAGNOSIS — E7849 Other hyperlipidemia: Secondary | ICD-10-CM | POA: Diagnosis not present

## 2018-05-22 DIAGNOSIS — I2510 Atherosclerotic heart disease of native coronary artery without angina pectoris: Secondary | ICD-10-CM | POA: Diagnosis not present

## 2018-05-22 DIAGNOSIS — M791 Myalgia, unspecified site: Secondary | ICD-10-CM

## 2018-05-22 DIAGNOSIS — I10 Essential (primary) hypertension: Secondary | ICD-10-CM

## 2018-05-22 DIAGNOSIS — I493 Ventricular premature depolarization: Secondary | ICD-10-CM | POA: Diagnosis not present

## 2018-05-22 DIAGNOSIS — I739 Peripheral vascular disease, unspecified: Secondary | ICD-10-CM

## 2018-05-22 MED ORDER — FUROSEMIDE 40 MG PO TABS: 40.0000 mg | ORAL_TABLET | Freq: Every day | ORAL | 2 refills | Status: DC

## 2018-05-22 MED ORDER — VERAPAMIL HCL ER 240 MG PO TBCR: 240.0000 mg | EXTENDED_RELEASE_TABLET | Freq: Every day | ORAL | 3 refills | Status: DC

## 2018-05-22 NOTE — Telephone Encounter (Signed)
I documented it in my note

## 2018-05-22 NOTE — Telephone Encounter (Signed)
Dr. Meda Coffee, since this is the pts first time filling her new med changes you made today in clinic, at her new pharmacy [facility] in [place],  [place] the Pharmacist there wanted to run an issue with the pt taking Oxycodone and increased Verapamil 240 mg po daily by you, for he states that the Verapamil can inhibit the breakdown of the pts Oxycodone 10 mg, and cause the body to release more of the narcotic in her system.  Made Marie Johnson aware that we had her on 180 mg of Verapamil and increased it to 240 mg today, so she has been taking Verapamil for sometime. Also endorsed to [place] that as noted in [place] we did discontinue her amlodipine as well.  Marie Johnson states that he will still need documentation from Dr Meda Coffee stating that it is ok to fill the Verapamil 240 mg po daily at bedtime, while pt is taking her Oxycodone.  Informed Marie Johnson that I will route this message to Dr Meda Coffee to review and advise, and follow-up with him shortly thereafter.  Marie Johnson verbalized understanding and agrees with this plan.

## 2018-05-22 NOTE — Telephone Encounter (Signed)
Endorsed to [place] at [facility] that Dr Meda Coffee documented this in her note with the pt from today, and we will continue with current plan of increase in Verapamil 240 mg po daily at bedtime, d/c amlodipine, and increase lasix.  Marie Johnson verbalized understanding and agrees with this plan.

## 2018-05-22 NOTE — Telephone Encounter (Signed)
New message   Pt c/o medication issue:  1. Name of Medication: verapamil (CALAN-SR) 240 MG CR tablet  2. How are you currently taking this medication (dosage and times per day)? Once daily at night  3. Are you having a reaction (difficulty breathing--STAT)?no   4. What is your medication issue? Marie Johnson states that there could be an interaction with Oxycodone HCl 10 MG TABS and it could cause an overdose based on the cyp3A4.  Please call to discuss these medications.

## 2018-05-22 NOTE — Progress Notes (Addendum)
Patient ID: ARLI BREE, female   DOB: [birth year], 67 y.o.   MRN: [id number]    Patient Name: Marie Johnson Date of Encounter: 05/22/2018  Primary Care Provider:  Leonides Sake, MD Primary Cardiologist:  Ena Dawley  Chief complain: 9 months follow up for DOE and claudications  Problem List   Past Medical History:  Diagnosis Date  . AAA (abdominal aortic aneurysm) (HCC)    3.4 cm  . Anxiety   . Aortic atherosclerosis ([place])   . Arthritis   . Asthma   . CAD (coronary artery disease)    a. Nonobstructive CAD by cath 07/17/13.  . CHD (congenital heart disease)    pt unaware???  . Chest pain    a. Adm 10-07/2013 - CTA neg for PE/dissection, cath with nonobstructive disease, CP ?due to uncontrolled HTN vs coronary vasospasm.  [name] COPD (chronic obstructive pulmonary disease) ([place])   . GERD (gastroesophageal reflux disease)   . Hyperlipidemia   . Hypertension   . Neuropathy   . Osteoporosis   . PAD (peripheral artery disease) ([place])    a. s/p L fem-pop bypass b. recent evaluation at [facility], unamenable to intervention  . Palpitations 02/22/2016  . Premature ventricular contraction   . PUD (peptic ulcer disease)   . PVD (peripheral vascular disease) ([place]) 07/17/2013  . Tobacco abuse    Past Surgical History:  Procedure Laterality Date  . ABDOMINAL HYSTERECTOMY    . CARDIAC CATHETERIZATION     several years ago, nonobstructive, "50-60% blockages"  . HIP SURGERY     left - bars and screws placed  . LEFT HEART CATHETERIZATION WITH CORONARY ANGIOGRAM N/A 07/17/2013   Procedure: LEFT HEART CATHETERIZATION WITH CORONARY ANGIOGRAM;  Surgeon: Larey Dresser, MD;  Location: [facility] CATH LAB;  Service: Cardiovascular;  Laterality: N/A;  . PERIPHERAL VASCULAR CATHETERIZATION N/A 07/16/2016   Procedure: Abdominal Aortogram w/Lower Extremity;  Surgeon: Rosetta Posner, MD;  Location: [facility] CV LAB;  Service: Cardiovascular;  Laterality: N/A;  . ROTATOR CUFF REPAIR     right side, had  torn ligaments as well  . TUBAL LIGATION    . VEIN BYPASS SURGERY     left leg  . WRIST ARTHROCENTESIS     left - broke in 3 places so has bars and screws placed   Allergies  Allergies  Allergen Reactions  . Bee Venom Anaphylaxis  . Latex Rash   HPI  67 year old female, older appearing, lifelong smoker with prior medical history of hypertension, PAD who was recently evaluated for an abdominal hernia. There was an incidental finding of significant coronary calcifications on the CT.  She did have a cardiac cath by Odis Hollingshead many years ago with findings of non-obstructive coronary artery disease at that time. There was 3.4 cm abdominal aortic aneurysm on her CT that is known for many years. The patient is a lifelong smoker, currently smoking one and one half packs per day. She has a h/o left femoropopliteal bypass by Dr. Donnetta Hutching 15 years ago. She underwent arterial Doppler ultrasound bilaterally and lower extremity angiogram at [facility]. She was told that she had severe diffuse peripheral arterial disease, however her lesions are distal and her vessels are small for interventions.  The patient had a negative stress test on 07/16/2013, however continuous chest pain. Therefore she underwent a cardiac catheterization on 07/17/13 wirth findings of non-obstructive CAD. The patient was started on amlodipine for HTN and possible vasospasm. A dobutamine stress echo in 03/2016 showed Normal  LV function, no ischemia.  She was seen by Dr. Rayann Heman. She underwent 48 hour Holter monitoring that showed 41,000 PVCs she was started on verapamil with good response with decreased amount of PVCs and improved symptoms. She is not considered an ablation candidate because of ongoing smoking and severe COPD.  She has seen Dr. 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 The left  internal iliac artery was occluded at its origin and reconstituted via collaterals. The right internal iliac artery was patent. The catheter was then withdrawn down below the level the renal arteries and runoff was obtained. On the right the common femoral artery was patent. The superficial femoral artery was occluded and there was reconstitution of the right above-knee popliteal artery. There was runoff via the anterior tibial and peroneal arteries on the right. On the left patient had a prior placed left femoral-popliteal bypass which was to the above-knee position. This was widely patent with no evidence of graft stenosis. There was two-vessel runoff. The anterior tibial and peroneal arteries. No intervention was performed. The patient continues to experience claudications and bluish discoloration of her lower extremities that is worsening. Dr. Donnetta Hutching states she is not amenable to intervention.  Today she is coming after 9 months, she is still dealing with significant stress because of her husband's health issues, he continues to have recurrent knee infection after knee replacement and remains in wheelchair.  She states that she has occasional palpitations that resolve with cough, no presyncope or syncope.  She has very occasional chest tightness.  She has been experiencing orthopnea and paroxysmal nocturnal dyspnea and lower extremity edema.   Home Medications  Prior to Admission medications   Medication Sig Start Date End Date Taking? Authorizing Provider  atenolol (TENORMIN) 50 MG tablet 50 mg 2 (two) times daily.  06/09/13  Yes Historical Provider, MD  furosemide (LASIX) 40 MG tablet  06/09/13  Yes Historical Provider, MD  LORazepam (ATIVAN) 1 MG tablet  04/22/13  Yes Historical Provider, MD  losartan (COZAAR) 25 MG tablet  05/26/13  Yes Historical Provider, MD  NITROSTAT 0.4 MG SL tablet  06/24/13  Yes Historical Provider, MD  Oxycodone HCl 10 MG TABS  06/08/13  Yes Historical Provider, MD    pantoprazole (PROTONIX) 40 MG tablet  06/09/13  Yes Historical Provider, MD  pravastatin (PRAVACHOL) 40 MG tablet  05/27/13  Yes Historical Provider, MD  PROAIR HFA 108 (90 BASE) MCG/ACT inhaler  06/08/13  Yes Historical Provider, MD  traMADol Veatrice Bourbon) 50 MG tablet  06/14/13  Yes Historical Provider, MD   Family History  Family History  Problem Relation Age of Onset  . Cervical cancer Mother   . Heart disease Mother        MI in 15s  . Liver cancer Father   . Heart disease Father   . Lung cancer Maternal Aunt   . Prostate cancer Maternal Uncle        met. anal  . Breast cancer Maternal Grandmother   . Lung cancer Maternal Aunt   . Lung cancer Maternal Aunt   . Stomach cancer Maternal Aunt   . Colon cancer Maternal Aunt   . Lung cancer Maternal Uncle     Social History  Social History   Socioeconomic History  . Marital status: Married    Spouse name: Not on file  . Number of children: Not on file  .  Years of education: Not on file  . Highest education level: Not on file  Occupational History  . Not on file  Social Needs  . Financial resource strain: Not on file  . Food insecurity:    Worry: Not on file    Inability: Not on file  . Transportation needs:    Medical: Not on file    Non-medical: Not on file  Tobacco Use  . Smoking status: Current Every Day Smoker    Packs/day: 1.00    Types: Cigarettes  . Smokeless tobacco: Never Used  Substance and Sexual Activity  . Alcohol use: No    Alcohol/week: 0.0 standard drinks  . Drug use: No  . Sexual activity: Not on file  Lifestyle  . Physical activity:    Days per week: Not on file    Minutes per session: Not on file  . Stress: Not on file  Relationships  . Social connections:    Talks on phone: Not on file    Gets together: Not on file    Attends religious service: Not on file    Active member of club or organization: Not on file    Attends meetings of clubs or organizations: Not on file    Relationship  status: Not on file  . Intimate partner violence:    Fear of current or ex partner: Not on file    Emotionally abused: Not on file    Physically abused: Not on file    Forced sexual activity: Not on file  Other Topics Concern  . Not on file  Social History Narrative   Pt lives in [place] with husband.  Retired Educational psychologist    Review of Systems General:  No chills, fever, night sweats or weight changes.  Cardiovascular:  No chest pain, dyspnea on exertion, edema, orthopnea, palpitations, paroxysmal nocturnal dyspnea. Dermatological: No rash, lesions/masses Respiratory: No cough, dyspnea Urologic: No hematuria, dysuria Abdominal:   No nausea, vomiting, diarrhea, bright red blood per rectum, melena, or hematemesis Neurologic:  No visual changes, wkns, changes in mental status. All other systems reviewed and are otherwise negative except as noted above.  Physical Exam  BP 128/78 mmHg, HR 83 BPM General: Older appearang, smelling like cigaretes, NAD Psych: Normal affect. Neuro: Alert and oriented X 3. Moves all extremities spontaneously. HEENT: Normal  Neck: Supple without bruits or JVD. Lungs:  Resp regular and unlabored, CTA Heart: RRR no s3, s4, or murmurs. Abdomen: Soft, non-tender, non-distended, BS + x 4.  Extremities: No clubbing, cyanosis, 2+ lower extremity edema B/L. No pulses  DP/PT/ bilaterally.  Accessory Clinical Findings  ECG -performed today 09/03/2017, personally reviewed, shows sinus rhythm with occasional PVCs, this is unchanged from prior EKG.  Left heart cath: 07/17/13 Hemodynamics:  AO 150/65  LV 159/17  Coronary angiography:  Coronary dominance: right  Left mainstem: Very mild distal LM tapering.  Left anterior descending (LAD): Heavy proximal LAD calcification. 30-40% stenosis in the proximal LAD at the take-off of a small 2nd diagonal.  Left circumflex (LCx): Medium-sized ramus without signficant disease. AV LCx with 30-40% ostial stenosis. OM1 with  30% ostial stenosis.  Right coronary artery (RCA): There was prominent proximal to mid RCA calcification. 30% proximal, 30% mid RCA stenosis.  Left ventriculography: Not done to conserve contrast. Normal EF on recent stress Cardiolite.  Final Conclusions: Nonobstructive CAD. I do not have an explanation for her chest pain. No PE or dissection on chest CT. I will keep her overnight to control her  blood pressure. Start amlodipine for BP control and in case this could be coronary vasospasm. Will keep on NTG gtt for now while BP is still quite high.  Marie Johnson  07/17/2013, 4:19 PM    Assessment & Plan  1. Frequent PVCs - 41,048 hour Holter monitor, high risk patient for ablation secondary to severe COPD. she had very good response to verapamil with minimal palpitations.  I will increase verapamil to 240 mg daily.  2.  Known nonobstructive CAD in all 3 vessels In 2014, negative stress 06/2016, unchanged symptoms, unchanged EKG that shows sinus rhythm and PVC.  Continue aspirin. She didn't tolerate beta blockers. Again advised on discontinuation of smoking. She is tolerating atorvastatin now.  3. Acute on Chronic CHF with preserved left ventricular ejection fraction -she has lower extremity edema, her lung sounds clear but she is experiencing proximal nocturnal dyspnea.  I will increase Lasix to 40 mg daily and obtain BMP and BNP as well as CBC a TSH today. I am going to obtain, BMP and BNP to assess to what degree her LE edema is sec to CHF vs potential drug effect of amlodipine.   5. Hypertension - controlled.  Repeated blood pressure 120/75 mmHg. D/C amlodipine, increase verapamil to control BP.   6. Hyperlipidemia - on atorvastatin 80 mg daily, as above, lipids all at goal a year ago, she had lipids recently at her primary care doctor will get the results.  7. PAD - poor peripheral pulses, followed by Dr. Sherren Mocha Early as described above, she has stable claudications, she is strongly advised to  quit smoking, unfortunately not amenable for intervention.  Follow-up in 4 weeks.   Ena Dawley, MD 05/22/2018, 8:59 AM

## 2018-05-22 NOTE — Patient Instructions (Signed)
Medication Instructions:   STOP TAKING AMLODIPINE NOW  INCREASE YOUR VERAPAMIL TO 240 MG ONCE DAILY AT BEDTIME  INCREASE YOUR LASIX TO 40 MG ONCE DAILY    Labwork:  TODAY--CMET, CBC W DIFF, TSH, PRO-BNP, AND VITAMIN D LEVEL    Follow-Up:  ONE MONTH WITH AN EXTENDER ON DR NELSON'S TEAM/ OR NEXT AVAILABLE EXTENDER AT THAT TIME      If you need a refill on your cardiac medications before your next appointment, please call your pharmacy.

## 2018-05-23 LAB — CBC WITH DIFFERENTIAL/PLATELET
Basophils Absolute: 0 10*3/uL (ref 0.0–0.2)
Basos: 0 %
EOS (ABSOLUTE): 0.1 10*3/uL (ref 0.0–0.4)
Eos: 2 %
Hematocrit: 40.7 % (ref 34.0–46.6)
Hemoglobin: 13.8 g/dL (ref 11.1–15.9)
Immature Grans (Abs): 0.1 10*3/uL (ref 0.0–0.1)
Immature Granulocytes: 1 %
Lymphocytes Absolute: 1.8 10*3/uL (ref 0.7–3.1)
Lymphs: 22 %
MCH: 33.4 pg — ABNORMAL HIGH (ref 26.6–33.0)
MCHC: 33.9 g/dL (ref 31.5–35.7)
MCV: 99 fL — ABNORMAL HIGH (ref 79–97)
Monocytes Absolute: 0.6 10*3/uL (ref 0.1–0.9)
Monocytes: 8 %
Neutrophils Absolute: 5.5 10*3/uL (ref 1.4–7.0)
Neutrophils: 67 %
Platelets: 294 10*3/uL (ref 150–450)
RBC: 4.13 x10E6/uL (ref 3.77–5.28)
RDW: 19.2 % — ABNORMAL HIGH (ref 12.3–15.4)
WBC: 8.1 10*3/uL (ref 3.4–10.8)

## 2018-05-23 LAB — COMPREHENSIVE METABOLIC PANEL
ALT: 12 IU/L (ref 0–32)
AST: 12 IU/L (ref 0–40)
Albumin/Globulin Ratio: 1.9 (ref 1.2–2.2)
Albumin: 4.6 g/dL (ref 3.6–4.8)
Alkaline Phosphatase: 67 IU/L (ref 39–117)
BUN/Creatinine Ratio: 13 (ref 12–28)
BUN: 10 mg/dL (ref 8–27)
Bilirubin Total: 0.5 mg/dL (ref 0.0–1.2)
CO2: 23 mmol/L (ref 20–29)
Calcium: 9.7 mg/dL (ref 8.7–10.3)
Chloride: 100 mmol/L (ref 96–106)
Creatinine, Ser: 0.79 mg/dL (ref 0.57–1.00)
GFR calc Af Amer: 88 mL/min/{1.73_m2} (ref >59)
GFR calc non Af Amer: 77 mL/min/{1.73_m2} (ref >59)
Globulin, Total: 2.4 g/dL (ref 1.5–4.5)
Glucose: 89 mg/dL (ref 65–99)
Potassium: 3.6 mmol/L (ref 3.5–5.2)
Sodium: 140 mmol/L (ref 134–144)
Total Protein: 7 g/dL (ref 6.0–8.5)

## 2018-05-23 LAB — VITAMIN D 25 HYDROXY (VIT D DEFICIENCY, FRACTURES): Vit D, 25-Hydroxy: 55.8 ng/mL (ref 30.0–100.0)

## 2018-05-23 LAB — PRO B NATRIURETIC PEPTIDE: NT-Pro BNP: 273 pg/mL (ref 0–301)

## 2018-05-23 LAB — TSH: TSH: 0.587 u[IU]/mL (ref 0.450–4.500)

## 2018-06-10 ENCOUNTER — Encounter: Payer: Self-pay | Admitting: Physician Assistant

## 2018-06-24 ENCOUNTER — Ambulatory Visit: Payer: PPO | Admitting: Physician Assistant

## 2018-07-02 ENCOUNTER — Ambulatory Visit: Payer: Medicare Other | Admitting: Vascular Surgery

## 2018-07-02 ENCOUNTER — Encounter: Payer: Self-pay | Admitting: Vascular Surgery

## 2018-07-02 ENCOUNTER — Encounter (HOSPITAL_COMMUNITY): Payer: PPO

## 2018-07-02 ENCOUNTER — Ambulatory Visit (HOSPITAL_COMMUNITY)
Admission: RE | Admit: 2018-07-02 | Discharge: 2018-07-02 | Disposition: A | Discharge status: Home / Self Care | Payer: Medicare Other | Source: Ambulatory Visit | Attending: Vascular Surgery | Admitting: Vascular Surgery

## 2018-07-02 ENCOUNTER — Encounter: Payer: PPO | Admitting: Vascular Surgery

## 2018-07-02 VITALS — BP 160/84 | HR 74 | Temp 97.5°F | Resp 18 | Ht 69.0 in | Wt 139.0 lb

## 2018-07-02 DIAGNOSIS — I83813 Varicose veins of bilateral lower extremities with pain: Secondary | ICD-10-CM

## 2018-07-02 NOTE — Progress Notes (Signed)
REASON FOR CONSULT:    Painful varicose veins bilaterally.  The consult is requested by Charlott Holler, NP  HPI:   Marie Johnson is a pleasant 67 y.o. female, referred for evaluation of varicose veins of both lower extremities but more significantly on the left side.  The patient describes aching heaviness in her legs which is aggravated by standing and sitting relieved somewhat with elevation.  She has tried knee-high compression stockings without much relief.  She does not tolerate ibuprofen.  She is unaware of any previous history of DVT.  The patient is undergone a previous left femoropopliteal bypass with a vein graft by Dr. Curt Jews.  She also has a known superficial femoral artery occlusion on the right.  Based on her incisions on her leg it looks like her left great saphenous vein has been taken for previous bypass surgery.  The patient has a lot of back pain and back issues with some neuropathy in both lower extremities.  Past Medical History:  Diagnosis Date  . AAA (abdominal aortic aneurysm) (HCC)    3.4 cm  . Anxiety   . Aortic atherosclerosis ([place])   . Arthritis   . Asthma   . CAD (coronary artery disease)    a. Nonobstructive CAD by cath 07/17/13.  . CHD (congenital heart disease)    pt unaware???  . Chest pain    a. Adm 10-07/2013 - CTA neg for PE/dissection, cath with nonobstructive disease, CP ?due to uncontrolled HTN vs coronary vasospasm.  [name] COPD (chronic obstructive pulmonary disease) ([place])   . GERD (gastroesophageal reflux disease)   . Hyperlipidemia   . Hypertension   . Neuropathy   . Osteoporosis   . PAD (peripheral artery disease) ([place])    a. s/p L fem-pop bypass b. recent evaluation at [facility], unamenable to intervention  . Palpitations 02/22/2016  . Premature ventricular contraction   . PUD (peptic ulcer disease)   . PVD (peripheral vascular disease) ([place]) 07/17/2013  . Tobacco abuse     Family History  Problem Relation Age of Onset  . Cervical cancer  Mother   . Heart disease Mother        MI in 65s  . Liver cancer Father   . Heart disease Father   . Lung cancer Maternal Aunt   . Prostate cancer Maternal Uncle        met. anal  . Breast cancer Maternal Grandmother   . Lung cancer Maternal Aunt   . Lung cancer Maternal Aunt   . Stomach cancer Maternal Aunt   . Colon cancer Maternal Aunt   . Lung cancer Maternal Uncle     SOCIAL HISTORY: She does continue to smoke 1 pack/day of cigarettes. Social History   Socioeconomic History  . Marital status: Married    Spouse name: Not on file  . Number of children: Not on file  . Years of education: Not on file  . Highest education level: Not on file  Occupational History  . Not on file  Social Needs  . Financial resource strain: Not on file  . Food insecurity:    Worry: Not on file    Inability: Not on file  . Transportation needs:    Medical: Not on file    Non-medical: Not on file  Tobacco Use  . Smoking status: Current Every Day Smoker    Packs/day: 1.00    Types: Cigarettes  . Smokeless tobacco: Never Used  Substance and Sexual Activity  . Alcohol  use: No    Alcohol/week: 0.0 standard drinks  . Drug use: No  . Sexual activity: Not on file  Lifestyle  . Physical activity:    Days per week: Not on file    Minutes per session: Not on file  . Stress: Not on file  Relationships  . Social connections:    Talks on phone: Not on file    Gets together: Not on file    Attends religious service: Not on file    Active member of club or organization: Not on file    Attends meetings of clubs or organizations: Not on file    Relationship status: Not on file  . Intimate partner violence:    Fear of current or ex partner: Not on file    Emotionally abused: Not on file    Physically abused: Not on file    Forced sexual activity: Not on file  Other Topics Concern  . Not on file  Social History Narrative   Pt lives in [place] with husband.  Retired Educational psychologist    Allergies   Allergen Reactions  . Bee Venom Anaphylaxis  . Latex Rash    Current Outpatient Medications  Medication Sig Dispense Refill  . albuterol (PROVENTIL HFA;VENTOLIN HFA) 108 (90 BASE) MCG/ACT inhaler Inhale 2 puffs into the lungs every 6 (six) hours as needed for wheezing.    [name] alendronate (FOSAMAX) 70 MG tablet Take 70 mg by mouth every Thursday.     [name] allopurinol (ZYLOPRIM) 300 MG tablet Take 300 mg by mouth daily.    [name] aspirin EC 81 MG tablet Take 1 tablet (81 mg total) by mouth daily. 90 tablet 3  . atorvastatin (LIPITOR) 80 MG tablet Take 1 tablet (80 mg total) by mouth daily. 90 tablet 3  . EPIPEN 2-PAK 0.3 MG/0.3ML SOAJ injection Inject 0.3 mg into the muscle daily as needed. Use as directed as needed for allergies.    . furosemide (LASIX) 40 MG tablet Take 1 tablet (40 mg total) by mouth daily. 90 tablet 2  . Menthol-Methyl Salicylate (MUSCLE RUB) 10-15 % CREA Apply 1 application topically daily as needed (for arthritis pain).    . nitroGLYCERIN (NITROSTAT) 0.4 MG SL tablet Place 1 tablet (0.4 mg total) under the tongue every 5 (five) minutes x 3 doses as needed for chest pain. 25 tablet 4  . Oxycodone HCl 10 MG TABS Take 10 mg by mouth 3 (three) times daily.     . pantoprazole (PROTONIX) 40 MG tablet Take 40 mg by mouth 2 (two) times daily.    . potassium chloride SA (K-DUR,KLOR-CON) 20 MEQ tablet Take 1 tablet (20 mEq total) by mouth daily. 90 tablet 3  . spironolactone (ALDACTONE) 25 MG tablet Take 1 tablet (25 mg total) by mouth daily. 90 tablet 3  . verapamil (CALAN-SR) 240 MG CR tablet Take 1 tablet (240 mg total) by mouth at bedtime. 90 tablet 3   No current facility-administered medications for this visit.    Facility-Administered Medications Ordered in Other Visits  Medication Dose Route Frequency Provider Last Rate Last Dose  . DOBUTamine (DOBUTREX) 1,000 mcg/mL in dextrose 5% 250 mL infusion  30 mcg/kg/min Intravenous Titrated Dorothy Spark, MD        REVIEW OF  SYSTEMS:  [X] denotes positive finding, [ ] denotes negative finding Cardiac  Comments:  Chest pain or chest pressure:    Shortness of breath upon exertion: x   Short of breath when lying flat:  Irregular heart rhythm:        Vascular    Pain in calf, thigh, or hip brought on by ambulation: x   Pain in feet at night that wakes you up from your sleep:  x   Blood clot in your veins:    Leg swelling:  x       Pulmonary    Oxygen at home:    Productive cough:  x   Wheezing:  x       Neurologic    Sudden weakness in arms or legs:     Sudden numbness in arms or legs:     Sudden onset of difficulty speaking or slurred speech:    Temporary loss of vision in one eye:     Problems with dizziness:         Gastrointestinal    Blood in stool:     Vomited blood:         Genitourinary    Burning when urinating:     Blood in urine:        Psychiatric    Major depression:         Hematologic    Bleeding problems:    Problems with blood clotting too easily:        Skin    Rashes or ulcers:        Constitutional    Fever or chills:     PHYSICAL EXAM:   Vitals:   07/02/18 1029  BP: (!) 160/84  Pulse: 74  Resp: 18  Temp: (!) 97.5 F (36.4 C)  SpO2: 99%  Weight: 139 lb (63 kg)  Height: 5' 9" (1.753 m)    GENERAL: The patient is a well-nourished female, in no acute distress. The vital signs are documented above. CARDIAC: There is a regular rate and rhythm.  VASCULAR: I do not detect carotid bruits. She has palpable femoral pulses.  I cannot palpate popliteal pulses.  She has a monophasic anterior tibial signal bilaterally. She has hyperpigmentation bilaterally. She has bilateral leg swelling which is more significant on the left side. She has a large cluster of varicose veins in the posterior left calf with multiple varicose veins and spider veins of both legs. I did look at her left small saphenous vein today in the office with the SonoSite with the patient standing.   The vein is significantly dilated with gross reflux in the proximal vein.  Of note the vein enters the popliteal vein well above the popliteal fossa. PULMONARY: There is good air exchange bilaterally without wheezing or rales. ABDOMEN: Soft and non-tender with normal pitched bowel sounds.  MUSCULOSKELETAL: There are no major deformities or cyanosis. NEUROLOGIC: No focal weakness or paresthesias are detected. SKIN: There are no ulcers or rashes noted. PSYCHIATRIC: The patient has a normal affect.  DATA:    VENOUS DUPLEX: I have independently interpreted her venous duplex scan today.  On the right side there is no evidence of deep venous thrombosis or superficial venous thrombosis.  There was deep venous reflux on the right involving the femoral vein and popliteal vein.  There was no significant superficial venous reflux on the right.  On the left side there is no evidence of DVT or superficial thrombophlebitis.  There was deep venous reflux on the left involving the common femoral vein and femoral vein.  There was superficial venous reflux involving the small saphenous vein.   ASSESSMENT & PLAN:   CHRONIC VENOUS INSUFFICIENCY: This patient has CEAP clinical  class 4a venous disease.  I have discussed with her the importance of intermittent leg elevation the proper positioning for this.  I have written her a prescription for thigh-high compression stockings with a gradient of 20 to 30 mmHg.  I will have her return in 3 months.  If her symptoms are not improved I think she would be a reasonable candidate for endovenous laser ablation of the left small saphenous vein and 10-20 stab phlebectomies.  At the same time we will get a duplex of her left femoropopliteal bypass graft and ABIs to be sure that her graft is patent.  Ablation will be somewhat complicated in that the small saphenous vein enters the popliteal vein well above the antecubital space and I certainly want to be well away from the bypass  graft.  I will see her back in 3 months.  She knows to call sooner if she has problems.   Deitra Mayo [facility] [phone]

## 2018-07-14 DIAGNOSIS — I5032 Chronic diastolic (congestive) heart failure: Secondary | ICD-10-CM | POA: Insufficient documentation

## 2018-07-14 NOTE — Progress Notes (Signed)
Cardiology Office Note    Date:  53/97/6734   ID:  Marie Johnson, Marie Johnson [birth year], MRN [id number]  PCP:  Philmore Pali, NP  Cardiologist: Ena Dawley, MD EPS: Thompson Grayer, MD  Chief Complaint  Patient presents with  . Follow-up    History of Present Illness:  Marie Johnson is a 67 y.o. 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  Felt to be high risk patient for ablation secondary to severe COPD.  Treated with verapamil.  Increase by Dr. Meda Coffee 05/22/2018.  She also had acute on chronic CHF that day with lower extremity edema versus coming from amlodipine.[name]  BNP was 273.  Amlodipine was stopped and Lasix was added.  Patient also has hypertension, HLD, PAD and continues to smoke.  Patient comes in today for follow-up.  Her swelling is much better.  Continues to have chronic dyspnea on exertion.  Smoking 7 to 8 cigarettes a day.  Trying to cut back.  Compression stockings ordered by Dr. Deitra Mayo for severe varicosities   Past Medical History:  Diagnosis Date  . AAA (abdominal aortic aneurysm) (HCC)    3.4 cm  . Anxiety   . Aortic atherosclerosis ([employer])   . Arthritis   . Asthma   . CAD (coronary artery disease)    a. Nonobstructive CAD by cath 07/17/13.  . CHD (congenital heart disease)    pt unaware???  . Chest pain    a. Adm 10-07/2013 - CTA neg for PE/dissection, cath with nonobstructive disease, CP ?due to uncontrolled HTN vs coronary vasospasm.  [name] COPD (chronic obstructive pulmonary disease) ([place])   . GERD (gastroesophageal reflux disease)   . Hyperlipidemia   . Hypertension   . Neuropathy   . Osteoporosis   . PAD (peripheral artery disease) ([place])    a. s/p L fem-pop bypass b. recent evaluation at [facility], unamenable to intervention  . Palpitations 02/22/2016  . Premature ventricular contraction   . PUD (peptic ulcer disease)   . PVD (peripheral vascular disease)  ([place]) 07/17/2013  . Tobacco abuse     Past Surgical History:  Procedure Laterality Date  . ABDOMINAL HYSTERECTOMY    . CARDIAC CATHETERIZATION     several years ago, nonobstructive, "50-60% blockages"  . HIP SURGERY     left - bars and screws placed  . LEFT HEART CATHETERIZATION WITH CORONARY ANGIOGRAM N/A 07/17/2013   Procedure: LEFT HEART CATHETERIZATION WITH CORONARY ANGIOGRAM;  Surgeon: Larey Dresser, MD;  Location: [facility] CATH LAB;  Service: Cardiovascular;  Laterality: N/A;  . PERIPHERAL VASCULAR CATHETERIZATION N/A 07/16/2016   Procedure: Abdominal Aortogram w/Lower Extremity;  Surgeon: Rosetta Posner, MD;  Location: [place] CV LAB;  Service: Cardiovascular;  Laterality: N/A;  . ROTATOR CUFF REPAIR     right side, had torn ligaments as well  . TUBAL LIGATION    . VEIN BYPASS SURGERY     left leg  . WRIST ARTHROCENTESIS     left - broke in 3 places so has bars and screws placed    Current Medications: Current Meds  Medication Sig  . albuterol (PROVENTIL HFA;VENTOLIN HFA) 108 (90 BASE) MCG/ACT inhaler Inhale 2 puffs into the lungs every 6 (six) hours as needed for wheezing.  [name] alendronate (FOSAMAX) 70 MG tablet Take 70 mg by mouth every Thursday.   [name] allopurinol (ZYLOPRIM) 300 MG tablet Take 300 mg by mouth daily.  [name] aspirin  EC 81 MG tablet Take 1 tablet (81 mg total) by mouth daily.  [name] atorvastatin (LIPITOR) 80 MG tablet Take 1 tablet (80 mg total) by mouth daily.  [name] EPIPEN 2-PAK 0.3 MG/0.3ML SOAJ injection Inject 0.3 mg into the muscle daily as needed. Use as directed as needed for allergies.  . furosemide (LASIX) 40 MG tablet Take 1 tablet (40 mg total) by mouth daily.  . Menthol-Methyl Salicylate (MUSCLE RUB) 10-15 % CREA Apply 1 application topically daily as needed (for arthritis pain).  . nitroGLYCERIN (NITROSTAT) 0.4 MG SL tablet Place 1 tablet (0.4 mg total) under the tongue every 5 (five) minutes x 3 doses as needed for chest pain.  . Oxycodone HCl 10 MG TABS Take  10 mg by mouth 3 (three) times daily.   . pantoprazole (PROTONIX) 40 MG tablet Take 40 mg by mouth 2 (two) times daily.  . potassium chloride SA (K-DUR,KLOR-CON) 20 MEQ tablet Take 1 tablet (20 mEq total) by mouth daily.  [name] spironolactone (ALDACTONE) 25 MG tablet Take 1 tablet (25 mg total) by mouth daily.  . verapamil (CALAN-SR) 240 MG CR tablet Take 1 tablet (240 mg total) by mouth at bedtime.     Allergies:   Bee venom and Latex   Social History   Socioeconomic History  . Marital status: Married    Spouse name: Not on file  . Number of children: Not on file  . Years of education: Not on file  . Highest education level: Not on file  Occupational History  . Not on file  Social Needs  . Financial resource strain: Not on file  . Food insecurity:    Worry: Not on file    Inability: Not on file  . Transportation needs:    Medical: Not on file    Non-medical: Not on file  Tobacco Use  . Smoking status: Current Every Day Smoker    Packs/day: 1.00    Types: Cigarettes  . Smokeless tobacco: Never Used  Substance and Sexual Activity  . Alcohol use: No    Alcohol/week: 0.0 standard drinks  . Drug use: No  . Sexual activity: Not on file  Lifestyle  . Physical activity:    Days per week: Not on file    Minutes per session: Not on file  . Stress: Not on file  Relationships  . Social connections:    Talks on phone: Not on file    Gets together: Not on file    Attends religious service: Not on file    Active member of club or organization: Not on file    Attends meetings of clubs or organizations: Not on file    Relationship status: Not on file  Other Topics Concern  . Not on file  Social History Narrative   Pt lives in [place] with husband.  Retired Educational psychologist     Family History:  The patient's family history includes Breast cancer in her maternal grandmother; Cervical cancer in her mother; Colon cancer in her maternal aunt; Heart disease in her father and mother; Liver  cancer in her father; Lung cancer in her maternal aunt, maternal aunt, maternal aunt, and maternal uncle; Prostate cancer in her maternal uncle; Stomach cancer in her maternal aunt.   ROS:   Please see the history of present illness.    Review of Systems  Constitution: Negative.  HENT: Negative.   Eyes: Negative.   Cardiovascular: Positive for claudication, dyspnea on exertion and leg swelling.  Respiratory: Positive for  shortness of breath and wheezing.   Hematologic/Lymphatic: Negative.   Musculoskeletal: Positive for muscle cramps. Negative for joint pain.       Leg pain from varicosities  Gastrointestinal: Negative.   Genitourinary: Negative.   Neurological: Negative.    All other systems reviewed and are negative.   PHYSICAL EXAM:   VS:  BP 126/76   Pulse 89   Ht 5\' 9"  (1.753 m)   Wt 141 lb 3.2 oz (64 kg)   SpO2 96%   BMI 20.85 kg/m   Physical Exam  GEN: Well nourished, well developed, in no acute distress  Neck: no JVD, carotid bruits, or masses Cardiac:RRR; no murmurs, rubs, or gallops  Respiratory: Decreased breath sounds with scattered wheezes throughout GI: soft, nontender, nondistended, + BS Ext: Trace of ankle edema decreased distal pulses Neuro:  Alert and Oriented x 3 Psych: euthymic mood, full affect  Wt Readings from Last 3 Encounters:  07/15/18 141 lb 3.2 oz (64 kg)  07/02/18 139 lb (63 kg)  05/22/18 146 lb 12.8 oz (66.6 kg)      Studies/Labs Reviewed:   EKG:  EKG is not ordered today.   Recent Labs: 05/22/2018: ALT 12; BUN 10; Creatinine, Ser 0.79; Hemoglobin 13.8; NT-Pro BNP 273; Platelets 294; Potassium 3.6; Sodium 140; TSH 0.587   Lipid Panel    Component Value Date/Time   CHOL 154 02/22/2016 1007   TRIG 82 02/22/2016 1007   HDL 51 02/22/2016 1007   CHOLHDL 3.0 02/22/2016 1007   VLDL 16 02/22/2016 1007   LDLCALC 87 02/22/2016 1007    Additional studies/ records that were reviewed today include:      ASSESSMENT:    1. Chronic  diastolic CHF (congestive heart failure) ([place])   2. Coronary artery disease involving native coronary artery of native heart, angina presence unspecified   3. Premature ventricular contraction   4. Essential hypertension   5. Other hyperlipidemia   6. Tobacco abuse      PLAN:  In order of problems listed above:  Chronic diastolic CHF with lower extremity edema last office visit.  Amlodipine stopped and placed on Lasix 40 mg once daily.  BNP was 273 within normal limits.  Edema much improved.  Will check renal function to make sure it stable and increase Lasix dose.  Follow-up with Dr. Meda Coffee in 1 year.  CAD nonobstructive on cath in 2014 normal NST 2017  Frequent PVCs over 41,000 on Holter monitor treated with verapamil, just increased last office visit-palpitations much less frequent.  Essential  hypertension blood pressure well controlled  Hyperlipidemia on Lipitor 80 mg daily.  LDL 86 05/27/2018 managed by PCP  Tobacco abuse smoking cessation recommended.    Medication Adjustments/Labs and Tests Ordered: Current medicines are reviewed at length with the patient today.  Concerns regarding medicines are outlined above.  Medication changes, Labs and Tests ordered today are listed in the Patient Instructions below. Patient Instructions  Medication Instructions:  Your physician recommends that you continue on your current medications as directed. Please refer to the Current Medication list given to you today.  If you need a refill on your cardiac medications before your next appointment, please call your pharmacy.   Lab work: TODAY: BMET  If you have labs (blood work) drawn today and your tests are completely normal, you will receive your results only by: [name] MyChart Message (if you have MyChart) OR . A paper copy in the mail If you have any lab test that is abnormal or  we need to change your treatment, we will call you to review the results.  Testing/Procedures: None  ordered  Follow-Up: At [facility], you and your health needs are our priority.  As part of our continuing mission to provide you with exceptional heart care, we have created designated Provider Care Teams.  These Care Teams include your primary Cardiologist (physician) and Advanced Practice Providers (APPs -  Physician Assistants and Nurse Practitioners) who all work together to provide you with the care you need, when you need it. . You will need a follow up appointment in 1 year.  Please call our office 2 months in advance to schedule this appointment.  You may see Ena Dawley, MD or one of the following Advanced Practice Providers on your designated Care Team:   . Lyda Jester, PA-C . Dayna Dunn, PA-C . Ermalinda Barrios, PA-C  Any Other Special Instructions Will Be Listed Below (If Applicable).       Sumner Boast, PA-C  07/15/2018 9:01 AM    [facility] [address], [place], [place]  [address] Phone: [phone]; Fax: [phone]

## 2018-07-15 ENCOUNTER — Telehealth: Payer: Self-pay

## 2018-07-15 ENCOUNTER — Encounter: Payer: Self-pay | Admitting: Physician Assistant

## 2018-07-15 ENCOUNTER — Ambulatory Visit: Payer: Medicare Other | Admitting: Physician Assistant

## 2018-07-15 VITALS — BP 126/76 | HR 89 | Ht 69.0 in | Wt 141.2 lb

## 2018-07-15 DIAGNOSIS — I2510 Atherosclerotic heart disease of native coronary artery without angina pectoris: Secondary | ICD-10-CM

## 2018-07-15 DIAGNOSIS — I493 Ventricular premature depolarization: Secondary | ICD-10-CM | POA: Diagnosis not present

## 2018-07-15 DIAGNOSIS — I10 Essential (primary) hypertension: Secondary | ICD-10-CM

## 2018-07-15 DIAGNOSIS — E7849 Other hyperlipidemia: Secondary | ICD-10-CM

## 2018-07-15 DIAGNOSIS — I739 Peripheral vascular disease, unspecified: Secondary | ICD-10-CM

## 2018-07-15 DIAGNOSIS — I5032 Chronic diastolic (congestive) heart failure: Secondary | ICD-10-CM

## 2018-07-15 DIAGNOSIS — Z720 Tobacco use: Secondary | ICD-10-CM

## 2018-07-15 DIAGNOSIS — M791 Myalgia, unspecified site: Secondary | ICD-10-CM

## 2018-07-15 LAB — BASIC METABOLIC PANEL
BUN / CREAT RATIO: 18 (ref 12–28)
BUN: 17 mg/dL (ref 8–27)
CO2: 19 mmol/L — ABNORMAL LOW (ref 20–29)
CREATININE: 0.94 mg/dL (ref 0.57–1.00)
Calcium: 9.5 mg/dL (ref 8.7–10.3)
Chloride: 94 mmol/L — ABNORMAL LOW (ref 96–106)
GFR calc Af Amer: 72 mL/min/{1.73_m2} (ref >59)
GFR, EST NON AFRICAN AMERICAN: 62 mL/min/{1.73_m2} (ref >59)
Glucose: 82 mg/dL (ref 65–99)
Potassium: 4 mmol/L (ref 3.5–5.2)
SODIUM: 132 mmol/L — AB (ref 134–144)

## 2018-07-15 MED ORDER — FUROSEMIDE 40 MG PO TABS: 20.0000 mg | ORAL_TABLET | Freq: Every day | ORAL | 1 refills | Status: DC

## 2018-07-15 NOTE — Telephone Encounter (Signed)
The patient has been notified of the result and recommendations to decrease lasix to 20 mg QD. Made her aware that she can take an extra 20 mg prn for LEE or weight gain >2-3 lbs in a day. Repeat BMET ordered and scheduled for 12/2. Patient verbalized understanding and thanked me for the call.

## 2018-07-15 NOTE — Patient Instructions (Addendum)
Medication Instructions:  Your physician recommends that you continue on your current medications as directed. Please refer to the Current Medication list given to you today.  If you need a refill on your cardiac medications before your next appointment, please call your pharmacy.   Lab work: TODAY: BMET  If you have labs (blood work) drawn today and your tests are completely normal, you will receive your results only by: [name] MyChart Message (if you have MyChart) OR . A paper copy in the mail If you have any lab test that is abnormal or we need to change your treatment, we will call you to review the results.  Testing/Procedures: None ordered  Follow-Up: At [facility], you and your health needs are our priority.  As part of our continuing mission to provide you with exceptional heart care, we have created designated Provider Care Teams.  These Care Teams include your primary Cardiologist (physician) and Advanced Practice Providers (APPs -  Physician Assistants and Nurse Practitioners) who all work together to provide you with the care you need, when you need it. . You will need a follow up appointment in 1 year.  Please call our office 2 months in advance to schedule this appointment.  You may see Ena Dawley, MD or one of the following Advanced Practice Providers on your designated Care Team:   . Lyda Jester, PA-C . Dayna Dunn, PA-C . Ermalinda Barrios, PA-C  Any Other Special Instructions Will Be Listed Below (If Applicable).

## 2018-07-15 NOTE — Telephone Encounter (Signed)
-----  Message from Imogene Burn, PA-C sent at 07/15/2018  4:10 PM EDT ----- Sodium down slightly most likely secondary to increased lasix dose. Can decrease lasix to  20 mg daily and can take 40 mg as needed for weight gain of 2 or 3 pounds overnight or increase leg edema.  Repeat be met in 1 month.

## 2018-08-18 ENCOUNTER — Other Ambulatory Visit: Payer: Medicare Other

## 2018-08-22 ENCOUNTER — Other Ambulatory Visit: Payer: Medicare Other | Admitting: *Deleted

## 2018-08-22 DIAGNOSIS — E7849 Other hyperlipidemia: Secondary | ICD-10-CM

## 2018-08-22 DIAGNOSIS — I493 Ventricular premature depolarization: Secondary | ICD-10-CM

## 2018-08-22 DIAGNOSIS — I10 Essential (primary) hypertension: Secondary | ICD-10-CM

## 2018-08-22 DIAGNOSIS — I739 Peripheral vascular disease, unspecified: Secondary | ICD-10-CM

## 2018-08-22 DIAGNOSIS — I2510 Atherosclerotic heart disease of native coronary artery without angina pectoris: Secondary | ICD-10-CM

## 2018-08-22 DIAGNOSIS — M791 Myalgia, unspecified site: Secondary | ICD-10-CM

## 2018-08-22 LAB — BASIC METABOLIC PANEL
BUN / CREAT RATIO: 15 (ref 12–28)
BUN: 12 mg/dL (ref 8–27)
CHLORIDE: 97 mmol/L (ref 96–106)
CO2: 23 mmol/L (ref 20–29)
Calcium: 9.5 mg/dL (ref 8.7–10.3)
Creatinine, Ser: 0.81 mg/dL (ref 0.57–1.00)
GFR calc non Af Amer: 74 mL/min/{1.73_m2} (ref >59)
GFR, EST AFRICAN AMERICAN: 86 mL/min/{1.73_m2} (ref >59)
Glucose: 97 mg/dL (ref 65–99)
Potassium: 4.3 mmol/L (ref 3.5–5.2)
SODIUM: 136 mmol/L (ref 134–144)

## 2018-10-15 ENCOUNTER — Other Ambulatory Visit: Payer: Self-pay

## 2018-10-15 DIAGNOSIS — I739 Peripheral vascular disease, unspecified: Secondary | ICD-10-CM

## 2018-10-20 ENCOUNTER — Ambulatory Visit (INDEPENDENT_AMBULATORY_CARE_PROVIDER_SITE_OTHER)
Admission: RE | Admit: 2018-10-20 | Discharge: 2018-10-20 | Disposition: A | Discharge status: Home / Self Care | Payer: Medicare Other | Source: Ambulatory Visit | Attending: Vascular Surgery | Admitting: Vascular Surgery

## 2018-10-20 ENCOUNTER — Ambulatory Visit (HOSPITAL_COMMUNITY)
Admission: RE | Admit: 2018-10-20 | Discharge: 2018-10-20 | Disposition: A | Discharge status: Home / Self Care | Payer: Medicare Other | Source: Ambulatory Visit | Attending: Vascular Surgery | Admitting: Vascular Surgery

## 2018-10-20 DIAGNOSIS — I739 Peripheral vascular disease, unspecified: Secondary | ICD-10-CM | POA: Diagnosis present

## 2018-10-22 ENCOUNTER — Encounter: Payer: Self-pay | Admitting: Vascular Surgery

## 2018-10-22 ENCOUNTER — Ambulatory Visit (INDEPENDENT_AMBULATORY_CARE_PROVIDER_SITE_OTHER): Payer: Medicare Other | Admitting: Vascular Surgery

## 2018-10-22 ENCOUNTER — Other Ambulatory Visit: Payer: Self-pay

## 2018-10-22 VITALS — BP 153/82 | HR 74 | Temp 97.1°F | Resp 14 | Ht 68.0 in | Wt 146.0 lb

## 2018-10-22 DIAGNOSIS — I739 Peripheral vascular disease, unspecified: Secondary | ICD-10-CM

## 2018-10-22 DIAGNOSIS — I83813 Varicose veins of bilateral lower extremities with pain: Secondary | ICD-10-CM | POA: Diagnosis not present

## 2018-10-22 NOTE — Progress Notes (Signed)
Patient name: Marie Johnson MRN: [id number] DOB: 06-Jul-1951 Sex: female  REASON FOR VISIT:   86-monthfollow-up of bypass graft.  HPI:   Marie LAZCANOis a pleasant 68y.o. female who I saw him in consultation on 07/02/2018 with painful varicose veins bilaterally.  The patient is also undergone a previous left femoropopliteal bypass with vein by Dr. 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  With respect to her chronic venous insufficiency we discussed conservative treatment.  On the left side she had deep venous reflux involving the common femoral vein and femoral vein.  There was superficial venous reflux involving the small saphenous vein only.  I felt that if she failed conservative treatment she might be a reasonable candidate for endovenous laser ablation of the left small saphenous vein with 10-20 stab phlebectomies.  I set her up for 396-monthollow-up visit also wanted her graft scanned to be sure that this was patent.  I felt an ablation might be somewhat complicated in that the small saphenous vein into the popliteal vein well above the popliteal space.  She comes in for 3-79-monthllow-up visit.  Since I saw her last she has multiple complaints involving the left lower extremity.  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  She experiences pain in her her entire left lower extremity and also has a history of neuropathy.  She also describes some bilateral calf claudication.  I do not get any history of real rest pain.  Her symptoms are not necessarily relieved with elevation and compression stockings sometimes hurt.  She has been wearing the tighter 20-30 thigh-high stockings.  She does continue to smoke a half a pack per day.  Past Medical History:  Diagnosis Date  . AAA (abdominal aortic aneurysm) (HCC)    3.4 cm  . Anxiety   . [place]ortic atherosclerosis ([place] . Arthritis   . Asthma   . CAD (coronary artery  disease)    a. Nonobstructive CAD by cath 07/17/13.  . CHD (congenital heart disease)    pt unaware???  . Chest pain    a. Adm 10-07/2013 - CTA neg for PE/dissection, cath with nonobstructive disease, CP ?due to uncontrolled HTN vs coronary vasospasm.  . CMarland KitchenPD (chronic obstructive pulmonary disease) ([place] . GERD (gastroesophageal reflux disease)   . Hyperlipidemia   . Hypertension   . Neuropathy   . Osteoporosis   . [place] disease) [place]  a. s/p L fem-pop bypass [facility]  . Palpitations 02/22/2016  . Premature ventricular contraction   . PUD (peptic ulcer disease)   . PVD (peripheral vascular disease) [place]  . Tobacco abuse     Family History  Problem Relation Age of Onset  . Cervical cancer Mother   . Heart disease Mother        MI in 40s68s Liver cancer Father   . Heart disease Father   . Lung cancer Maternal Aunt   . Prostate cancer Maternal Uncle        met. anal  . Breast cancer Maternal Grandmother   . Lung cancer Maternal Aunt   . Lung cancer Maternal Aunt   . Stomach cancer Maternal Aunt   . Colon cancer Maternal Aunt   . Lung cancer Maternal Uncle     SOCIAL HISTORY: Social History  Tobacco Use  . Smoking status: Current Every Day Smoker    Packs/day: 1.00    Types: Cigarettes  . Smokeless tobacco: Never Used  Substance Use Topics  . Alcohol use: No    Alcohol/week: 0.0 standard drinks    Allergies  Allergen Reactions  . Bee Venom Anaphylaxis  . Latex Rash    Current Outpatient Medications  Medication Sig Dispense Refill  . albuterol (PROVENTIL HFA;VENTOLIN HFA) 108 (90 BASE) MCG/ACT inhaler Inhale 2 puffs into the lungs every 6 (six) hours as needed for wheezing.    [name] alendronate (FOSAMAX) 70 MG tablet Take 70 mg by mouth every Thursday.     [name] allopurinol (ZYLOPRIM) 300 MG tablet Take 300 mg by mouth daily.    [name] aspirin EC 81 MG tablet Take 1 tablet (81 mg total) by  mouth daily. (Patient taking differently: Take 325 mg by mouth daily. ) 90 tablet 3  . atorvastatin (LIPITOR) 80 MG tablet Take 1 tablet (80 mg total) by mouth daily. 90 tablet 3  . EPIPEN 2-PAK 0.3 MG/0.3ML SOAJ injection Inject 0.3 mg into the muscle daily as needed. Use as directed as needed for allergies.    . furosemide (LASIX) 40 MG tablet Take 0.5 tablets (20 mg total) by mouth daily. You may take an extra 20 mg for swelling or weight gain 90 tablet 1  . Menthol-Methyl Salicylate (MUSCLE RUB) 10-15 % CREA Apply 1 application topically daily as needed (for arthritis pain).    . nitroGLYCERIN (NITROSTAT) 0.4 MG SL tablet Place 1 tablet (0.4 mg total) under the tongue every 5 (five) minutes x 3 doses as needed for chest pain. 25 tablet 4  . Oxycodone HCl 10 MG TABS Take 10 mg by mouth 3 (three) times daily.     . pantoprazole (PROTONIX) 40 MG tablet Take 40 mg by mouth 2 (two) times daily.    . potassium chloride SA (K-DUR,KLOR-CON) 20 MEQ tablet Take 1 tablet (20 mEq total) by mouth daily. 90 tablet 3  . spironolactone (ALDACTONE) 25 MG tablet Take 1 tablet (25 mg total) by mouth daily. 90 tablet 3  . verapamil (CALAN-SR) 240 MG CR tablet Take 1 tablet (240 mg total) by mouth at bedtime. 90 tablet 3   No current facility-administered medications for this visit.    Facility-Administered Medications Ordered in Other Visits  Medication Dose Route Frequency Provider Last Rate Last Dose  . DOBUTamine (DOBUTREX) 1,000 mcg/mL in dextrose 5% 250 mL infusion  30 mcg/kg/min Intravenous Titrated Dorothy Spark, MD        REVIEW OF SYSTEMS:  _0  denotes positive finding, _1  denotes negative finding Cardiac  Comments:  Chest pain or chest pressure:    Shortness of breath upon exertion:    Short of breath when lying flat:    Irregular heart rhythm:        Vascular    Pain in calf, thigh, or hip brought on by ambulation:    Pain in feet at night that wakes you up from your sleep:     Blood  clot in your veins:    Leg swelling:         Pulmonary    Oxygen at home:    Productive cough:     Wheezing:         Neurologic    Sudden weakness in arms or legs:     Sudden numbness in arms or legs:     Sudden onset of difficulty speaking or  slurred speech:    Temporary loss of vision in one eye:     Problems with dizziness:         Gastrointestinal    Blood in stool:     Vomited blood:         Genitourinary    Burning when urinating:     Blood in urine:        Psychiatric    Major depression:         Hematologic    Bleeding problems:    Problems with blood clotting too easily:        Skin    Rashes or ulcers:        Constitutional    Fever or chills:     PHYSICAL EXAM:   Vitals:   10/22/18 0912  BP: (!) 153/82  Pulse: 74  Resp: 14  Temp: (!) 97.1 F (36.2 C)  TempSrc: Oral  SpO2: 98%  Weight: 146 lb (66.2 kg)  Height: _0  (1.727 m)    GENERAL: The patient is a well-nourished female, in no acute distress. The vital signs are documented above. CARDIAC: There is a regular rate and rhythm.  VASCULAR: I do not detect carotid bruits. She has palpable femoral pulses.  I cannot palpate pedal pulses. She has large dilated varicose veins of her posterior left calf. I did look at her small saphenous vein again with the SonoSite and she does have reflux.  The vein is enters the deep system well above the popliteal space.  There are large varicose veins which come off of the small saphenous vein. PULMONARY: There is good air exchange bilaterally without wheezing or rales. ABDOMEN: Soft and non-tender with normal pitched bowel sounds.  MUSCULOSKELETAL: There are no major deformities or cyanosis. NEUROLOGIC: No focal weakness or paresthesias are detected. SKIN: There are no ulcers or rashes noted. PSYCHIATRIC: The patient has a normal affect.  DATA:    ARTERIAL DOPPLER STUDY: I reviewed the arterial Doppler study that was done on 10/20/2018.  On the left side  there was a monophasic posterior tibial signal with a biphasic dorsalis pedis signal.  ABI was 89%.  Toe pressure was 172 mmHg.  On the right side, there was a biphasic dorsalis pedis and posterior tibial signal.  ABI was 63% and toe pressure was 78 mmHg.  GRAFT DUPLEX: I reviewed the graft duplex that was done on 10/20/2018.  This shows that the left femoropopliteal bypass graft is patent with an area of 50 to 70% stenosis in the proximal anastomosis.  There is some mild inflow disease noted also.  MEDICAL ISSUES:   CHRONIC VENOUS INSUFFICIENCY: The patient is potentially a candidate for laser ablation of the small saphenous vein with stab phlebectomies, however, I think a lot of her symptoms are related to the arthritis in her hip.  I explained that I am not sure that the procedure would significantly help with her symptoms given the multitude of symptoms that she is having.  I think there is also slightly increased risk with the procedure given that she has a left femoropopliteal bypass graft and would require significant compression after the procedure.  All things considered at this point I have not recommended laser ablation of the small saphenous vein with stab phlebectomies.  STATUS POST LEFT FEMOROPOPLITEAL BYPASS: The patient has undergone a previous left femoropopliteal bypass graft with vein by Dr. Donnetta Hutching.  She has some mildly elevated velocities at the proximal anastomosis.  I recommended follow-up ABIs  and a duplex of her graft in 1 year.  She can be seen by the nurse practitioner or PA at that time.  We have also again discussed the importance of tobacco cessation.  She is on aspirin and is on a statin.  Deitra Mayo [facility] [phone]

## 2018-10-25 ENCOUNTER — Other Ambulatory Visit: Payer: Self-pay | Admitting: Cardiology

## 2018-10-25 DIAGNOSIS — M791 Myalgia, unspecified site: Secondary | ICD-10-CM

## 2018-10-25 DIAGNOSIS — I10 Essential (primary) hypertension: Secondary | ICD-10-CM

## 2018-10-25 DIAGNOSIS — E7849 Other hyperlipidemia: Secondary | ICD-10-CM

## 2018-10-25 DIAGNOSIS — I739 Peripheral vascular disease, unspecified: Secondary | ICD-10-CM

## 2018-10-25 DIAGNOSIS — I2510 Atherosclerotic heart disease of native coronary artery without angina pectoris: Secondary | ICD-10-CM

## 2019-02-25 ENCOUNTER — Other Ambulatory Visit: Payer: Self-pay | Admitting: Physician Assistant

## 2019-02-25 DIAGNOSIS — M791 Myalgia, unspecified site: Secondary | ICD-10-CM

## 2019-02-25 DIAGNOSIS — I493 Ventricular premature depolarization: Secondary | ICD-10-CM

## 2019-02-25 DIAGNOSIS — E7849 Other hyperlipidemia: Secondary | ICD-10-CM

## 2019-02-25 DIAGNOSIS — I10 Essential (primary) hypertension: Secondary | ICD-10-CM

## 2019-02-25 DIAGNOSIS — I739 Peripheral vascular disease, unspecified: Secondary | ICD-10-CM

## 2019-02-25 DIAGNOSIS — I2510 Atherosclerotic heart disease of native coronary artery without angina pectoris: Secondary | ICD-10-CM

## 2019-03-18 DIAGNOSIS — Z01818 Encounter for other preprocedural examination: Secondary | ICD-10-CM | POA: Diagnosis not present

## 2019-04-13 ENCOUNTER — Telehealth: Payer: Self-pay | Admitting: Cardiology

## 2019-04-13 NOTE — Telephone Encounter (Signed)
New Message ° ° ° °Left message to confirm appt and answer covid questions  °

## 2019-04-14 ENCOUNTER — Other Ambulatory Visit: Payer: Self-pay

## 2019-04-14 ENCOUNTER — Encounter: Payer: Self-pay | Admitting: Cardiology

## 2019-04-14 ENCOUNTER — Ambulatory Visit (INDEPENDENT_AMBULATORY_CARE_PROVIDER_SITE_OTHER): Payer: Medicare Other | Admitting: Cardiology

## 2019-04-14 VITALS — BP 132/68 | HR 89 | Ht 68.0 in | Wt 134.8 lb

## 2019-04-14 DIAGNOSIS — I493 Ventricular premature depolarization: Secondary | ICD-10-CM

## 2019-04-14 DIAGNOSIS — R0609 Other forms of dyspnea: Secondary | ICD-10-CM

## 2019-04-14 DIAGNOSIS — I2510 Atherosclerotic heart disease of native coronary artery without angina pectoris: Secondary | ICD-10-CM | POA: Diagnosis not present

## 2019-04-14 DIAGNOSIS — Z01810 Encounter for preprocedural cardiovascular examination: Secondary | ICD-10-CM

## 2019-04-14 MED ORDER — EZETIMIBE 10 MG PO TABS: 10.0000 mg | ORAL_TABLET | Freq: Every day | ORAL | 3 refills | Status: DC

## 2019-04-14 NOTE — Patient Instructions (Signed)
Medication Instructions:  START ZETIA  10 mg DAILY If you need a refill on your cardiac medications before your next appointment, please call your pharmacy.   Lab work: PLEASE SCHEDULE 8 WEEKS FLP HFT If you have labs (blood work) drawn today and your tests are completely normal, you will receive your results only by: [name] MyChart Message (if you have MyChart) OR . A paper copy in the mail If you have any lab test that is abnormal or we need to change your treatment, we will call you to review the results.  Testing/Procedures: Your physician has requested that you have a lexiscan myoview. For further information please visit [email or web address]. Please follow instruction sheet, as given.    Follow-Up: At [facility], you and your health needs are our priority.  As part of our continuing mission to provide you with exceptional heart care, we have created designated Provider Care Teams.  These Care Teams include your primary Cardiologist (physician) and Advanced Practice Providers (APPs -  Physician Assistants and Nurse Practitioners) who all work together to provide you with the care you need, when you need it. You will need a follow up appointment in 6 months.  Please call our office 2 months in advance to schedule this appointment.  You may see Ena Dawley, MD or one of the following Advanced Practice Providers on your designated Care Team:   Isabella, PA-C Melina Copa, PA-C . Ermalinda Barrios, PA-C  Any Other Special Instructions Will Be Listed Below (If Applicable).

## 2019-04-14 NOTE — Progress Notes (Addendum)
[birth year] Marie Johnson   [birth year]  [id number]  Primary Physician Chauncy Passy Rudi Rummage, NP Primary Cardiologist: Ena Dawley, MD  Electrophysiologist: Thompson Grayer, MD   Reason for Visit/CC: Preop Evaluation/ Cardiac Clearance  HPI:  Marie Johnson is a 68 y.o. female, followed by Dr. 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  Felt to be high risk patient for ablation secondary to severe COPD.  Treated with verapamil. Patient also has hypertension, HLD, PAD s/p L fem-pop bypass and AAA followed by vascular surgery and is a smoker. She is here today for preop clearance. She needs to undergo orthopedic surgery on her left hip. She is s/p left hip fracture almost 30 years ago that was treated with hip compression screw and plate. Due to severe, lifestyle limiting hip pain and osteoarthritis, she will need implant extraction followed by THR.   She reports that she is unable to complete more than 4 METS of physical activity due to combination of hip pain, exertional dyspnea and claudication.  She is unable to walk 4 blocks and unable to walk up a flight of stairs due to the symptoms.  She denies any resting chest pain.  No syncope/near syncope.  Her EKG today shows normal sinus rhythm, 89 bpm with no ischemic abnormalities.  Despite her vascular disease, she continues to smoke cigarettes, on average 3 to 4/day.  She has been a smoker for over 55 years.   Cardiac Studies  LHC 2014- nonobstructive CAD  2D Echo 03/22/2016 Study Conclusions  - Left ventricle: The cavity size was normal. Wall thickness was   increased in a pattern of mild LVH. Systolic function was normal.   The estimated ejection fraction was in the range of 60% to 65%.   Wall motion was normal; there were no regional wall motion   abnormalities. Doppler parameters are consistent with abnormal  left ventricular relaxation (grade 1 diastolic dysfunction). The   E/e&' ratio is between 8-15, suggesting indeterminate LV filling   pressure. - Aortic valve: Sclerosis without stenosis. There was no   regurgitation. - Mitral valve: Mildly thickened leaflets . There was trivial   regurgitation. - Left atrium: The atrium was normal in size. - Inferior vena cava: The vessel was normal in size. The   respirophasic diameter changes were in the normal range (= 50%),   consistent with normal central venous pressure.  Impressions:  - Compared to the prior echo in 2014, there are no significant   changes.   Current Meds  Medication Sig  . aspirin 325 MG tablet Take 325 mg by mouth daily.   Allergies  Allergen Reactions  . Bee Venom Anaphylaxis  . Latex Rash   Past Medical History:  Diagnosis Date  . AAA (abdominal aortic aneurysm) (HCC)    3.4 cm  . Anxiety   . Aortic atherosclerosis ([place])   . Arthritis   . Asthma   . CAD (coronary artery disease)    a. Nonobstructive CAD by cath 07/17/13.  . CHD (congenital heart disease)    pt unaware???  . Chest pain    a. Adm 10-07/2013 - CTA neg for PE/dissection, cath with nonobstructive disease, CP ?due to uncontrolled HTN vs coronary vasospasm.  [name] COPD (chronic obstructive pulmonary disease) ([place])   . GERD (gastroesophageal reflux disease)   . Hyperlipidemia   . Hypertension   . Neuropathy   .  Osteoporosis   . PAD (peripheral artery disease) ([place])    a. s/p L fem-pop bypass b. recent evaluation at [facility], unamenable to intervention  . Palpitations 02/22/2016  . Premature ventricular contraction   . PUD (peptic ulcer disease)   . PVD (peripheral vascular disease) ([place]) 07/17/2013  . Tobacco abuse    Family History  Problem Relation Age of Onset  . Cervical cancer Mother   . Heart disease Mother        MI in 30s  . Liver cancer Father   . Heart disease Father   . Lung cancer Maternal Aunt   . Prostate cancer Maternal Uncle         met. anal  . Breast cancer Maternal Grandmother   . Lung cancer Maternal Aunt   . Lung cancer Maternal Aunt   . Stomach cancer Maternal Aunt   . Colon cancer Maternal Aunt   . Lung cancer Maternal Uncle    Past Surgical History:  Procedure Laterality Date  . ABDOMINAL HYSTERECTOMY    . CARDIAC CATHETERIZATION     several years ago, nonobstructive, "50-60% blockages"  . HIP SURGERY     left - bars and screws placed  . LEFT HEART CATHETERIZATION WITH CORONARY ANGIOGRAM N/A 07/17/2013   Procedure: LEFT HEART CATHETERIZATION WITH CORONARY ANGIOGRAM;  Surgeon: Larey Dresser, MD;  Location: [facility] CATH LAB;  Service: Cardiovascular;  Laterality: N/A;  . PERIPHERAL VASCULAR CATHETERIZATION N/A 07/16/2016   Procedure: Abdominal Aortogram w/Lower Extremity;  Surgeon: Rosetta Posner, MD;  Location: [facility] CV LAB;  Service: Cardiovascular;  Laterality: N/A;  . ROTATOR CUFF REPAIR     right side, had torn ligaments as well  . TUBAL LIGATION    . VEIN BYPASS SURGERY     left leg  . WRIST ARTHROCENTESIS     left - broke in 3 places so has bars and screws placed   Social History   Socioeconomic History  . Marital status: Married    Spouse name: Not on file  . Number of children: Not on file  . Years of education: Not on file  . Highest education level: Not on file  Occupational History  . Not on file  Social Needs  . Financial resource strain: Not on file  . Food insecurity    Worry: Not on file    Inability: Not on file  . Transportation needs    Medical: Not on file    Non-medical: Not on file  Tobacco Use  . Smoking status: Current Every Day Smoker    Packs/day: 1.00    Types: Cigarettes  . Smokeless tobacco: Never Used  Substance and Sexual Activity  . Alcohol use: No    Alcohol/week: 0.0 standard drinks  . Drug use: No  . Sexual activity: Not on file  Lifestyle  . Physical activity    Days per week: Not on file    Minutes per session: Not on file  . Stress:  Not on file  Relationships  . Social Herbalist on phone: Not on file    Gets together: Not on file    Attends religious service: Not on file    Active member of club or organization: Not on file    Attends meetings of clubs or organizations: Not on file    Relationship status: Not on file  . Intimate partner violence    Fear of current or ex partner: Not on file    Emotionally abused:  Not on file    Physically abused: Not on file    Forced sexual activity: Not on file  Other Topics Concern  . Not on file  Social History Narrative   Pt lives in [place] with husband.  Retired Educational psychologist     Lipid Panel     Component Value Date/Time   CHOL 154 02/22/2016 1007   TRIG 82 02/22/2016 1007   HDL 51 02/22/2016 1007   CHOLHDL 3.0 02/22/2016 1007   VLDL 16 02/22/2016 1007   LDLCALC 87 02/22/2016 1007    Review of Systems: General: negative for chills, fever, night sweats or weight changes.  Cardiovascular: negative for chest pain, dyspnea on exertion, edema, orthopnea, palpitations, paroxysmal nocturnal dyspnea or shortness of breath Dermatological: negative for rash Respiratory: negative for cough or wheezing Urologic: negative for hematuria Abdominal: negative for nausea, vomiting, diarrhea, bright red blood per rectum, melena, or hematemesis Neurologic: negative for visual changes, syncope, or dizziness All other systems reviewed and are otherwise negative except as noted above.   Physical Exam:  Blood pressure 132/68, pulse 89, height 5' 8" (1.727 m), weight 134 lb 12.8 oz (61.1 kg), SpO2 95 %.  General appearance: alert, cooperative and no distress Neck: no carotid bruit and no JVD Lungs: clear to auscultation bilaterally Heart: regular rate and rhythm, S1, S2 normal, no murmur, click, rub or gallop Extremities: extremities normal, atraumatic, no cyanosis or edema Pulses: 2+ and symmetric Skin: Skin color, texture, turgor normal. No rashes or lesions  Neurologic: Grossly normal  EKG EKG shows NSR. 89 bpm. No ischemic abnormalities.  -- personally reviewed   ASSESSMENT AND PLAN:   1.  Preoperative cardiovascular examination: This is a 68 year old female smoker of 55+ years and vascularpath with evidence of CAD on cardiac catheterization 6 years ago in 2014 as well as significant peripheral vascular disease status post L fem-pop bypass as well as known AAA followed by vascular surgery, hypertension and hyperlipidemia with recent LDL above recommended goal, measuring at 83 mg/dL.  Patient is needing to undergo noncardiac, orthopedic, surgery with intent to remove left hip implants followed by total hip arthroplasty.  Patient is unable to complete more than 4 METS of physical activity (unable to ambulate a flight of stairs and unable to ambulate 4 blocks) due to severe hip pain, exertional dyspnea and claudication.  Given her known vascular disease and other risk factors as well as her exertional dyspnea with minimal activity, we will plan a chemical stress test to rule out ischemia prior to clearance.  The patient does have a history of COPD however she tells me that this has been stable.  She has no supplemental O2 requirements and her lung exam today is clear without any wheezing.  She tells me that she has only had to use her nebulizer twice this entire year.  We will plan Lexiscan stress test.  If no ischemia, she can be cleared from a cardiac standpoint.    2.  Hyperlipidemia: Patient had recent fasting lipid panel March 12, 2019 which showed elevated LDL at 83 mg/dl. Given her vascular disease including CAD and PVD, recommended LDL  less than 70 mg/dL.  She is on atorvastatin 80 and reports full compliance nightly.  We will add Zetia 10 mg.  We will plan to repeat fasting lipid panel in 8 weeks.  3. CAD: Nonobstructive CAD on cardiac catheterization more than 6 years ago.  Low risk stress test in 2017.  As outlined above we will order a  chemical  stress test to rule out ischemia prior to clearing for surgery. Continue ASA and statin.   4. Tobacco Abuse: 55+ yr history. Remains active smoker, 3-4 cigs a day. Smoking cessation encouraged.   5. PVD: s/p L fem-pop bypass and AAA (3.4 cm) followed by vascular surgery. BP controlled.   6. HTN: BP controlled on current regimen.    Follow-Up: if stress test is low risk, she can be cleared for surgery with plans to f/u again in 6 months. If abnormal, will f/u post stress test to discuss further evaluation.   Addendum: Stress test is low risk. No signs of ischemia (abnormal blood flow). She can be cleared for surgery from a cardiac standpoint.   Brittainy Ladoris Gene, MHS [facility] 04/14/2019 8:14 AM

## 2019-04-20 ENCOUNTER — Telehealth (HOSPITAL_COMMUNITY): Payer: Self-pay | Admitting: *Deleted

## 2019-04-20 NOTE — Telephone Encounter (Signed)
Patient given detailed instructions per Myocardial Perfusion Study Information Sheet for the test on 04/22/19 at 7:30. Patient notified to arrive 15 minutes early and that it is imperative to arrive on time for appointment to keep from having the test rescheduled.  If you need to cancel or reschedule your appointment, please call the office within 24 hours of your appointment. . Patient verbalized understanding.Marie Johnson

## 2019-04-22 ENCOUNTER — Ambulatory Visit (HOSPITAL_COMMUNITY): Payer: Medicare Other | Attending: Internal Medicine

## 2019-04-22 ENCOUNTER — Telehealth: Payer: Self-pay

## 2019-04-22 ENCOUNTER — Other Ambulatory Visit: Payer: Self-pay

## 2019-04-22 DIAGNOSIS — I493 Ventricular premature depolarization: Secondary | ICD-10-CM

## 2019-04-22 DIAGNOSIS — Z01810 Encounter for preprocedural cardiovascular examination: Secondary | ICD-10-CM | POA: Diagnosis not present

## 2019-04-22 DIAGNOSIS — I2510 Atherosclerotic heart disease of native coronary artery without angina pectoris: Secondary | ICD-10-CM

## 2019-04-22 DIAGNOSIS — R0609 Other forms of dyspnea: Secondary | ICD-10-CM

## 2019-04-22 LAB — MYOCARDIAL PERFUSION IMAGING
LV dias vol: 53 mL (ref 46–106)
LV sys vol: 16 mL
SDS: 3
SRS: 1
SSS: 4
TID: 0.93

## 2019-04-22 MED ORDER — TECHNETIUM TC 99M TETROFOSMIN IV KIT
10.2000 | PACK | Freq: Once | INTRAVENOUS | Status: AC | PRN
  Administered 2019-04-22: 10.2 via INTRAVENOUS
  Filled 2019-04-22: qty 11

## 2019-04-22 MED ORDER — TECHNETIUM TC 99M TETROFOSMIN IV KIT
31.4000 | PACK | Freq: Once | INTRAVENOUS | Status: AC | PRN
  Administered 2019-04-22: 31.4 via INTRAVENOUS
  Filled 2019-04-22: qty 32

## 2019-04-22 MED ORDER — REGADENOSON 0.4 MG/5ML IV SOLN
0.4000 mg | Freq: Once | INTRAVENOUS | Status: AC
  Administered 2019-04-22: 0.4 mg via INTRAVENOUS

## 2019-04-22 NOTE — Telephone Encounter (Signed)
-----   Message from Consuelo Pandy, [place] sent at 04/22/2019  4:10 PM EDT ----- Stress test is low risk. No signs of ischemia (abnormal blood flow). She can be cleared for surgery from a cardiac standpoint. Can you please get info of requesting MD so that we can fax clearance. I believe it was [employer]. Clearance form not in epic.

## 2019-04-22 NOTE — Telephone Encounter (Signed)
Notes recorded by Frederik Schmidt, RN on 04/22/2019 at 4:26 PM EDT  The patient has been notified of the result and verbalized understanding. All questions (if any) were answered.  Frederik Schmidt, RN 04/22/2019 4:26 PM   I spoke to the patient and retrieved the following information:  [facility] Dr Creig Hines FAX:  [phone]  PHONE:  [phone]

## 2019-04-24 ENCOUNTER — Encounter: Payer: Self-pay | Admitting: Cardiology

## 2019-04-24 NOTE — Progress Notes (Signed)
   TO: [facility]        Dr Creig Hines        FAX:  [phone]   Pt has completed preoperative cardiac examination. Nuclear stress test shows no coronary ischemia. She is cleared to undergo hip surgery from a cardiac standpoint without need for further cardiac testing.   Brittainy M. Hopwood, [place] [phone]

## 2019-04-29 NOTE — Progress Notes (Signed)
done

## 2019-05-12 ENCOUNTER — Other Ambulatory Visit: Payer: Self-pay | Admitting: Cardiology

## 2019-05-12 DIAGNOSIS — I10 Essential (primary) hypertension: Secondary | ICD-10-CM

## 2019-05-12 DIAGNOSIS — M791 Myalgia, unspecified site: Secondary | ICD-10-CM

## 2019-05-12 DIAGNOSIS — E7849 Other hyperlipidemia: Secondary | ICD-10-CM

## 2019-05-12 DIAGNOSIS — I2510 Atherosclerotic heart disease of native coronary artery without angina pectoris: Secondary | ICD-10-CM

## 2019-05-12 DIAGNOSIS — I739 Peripheral vascular disease, unspecified: Secondary | ICD-10-CM

## 2019-05-12 DIAGNOSIS — I493 Ventricular premature depolarization: Secondary | ICD-10-CM

## 2019-06-09 ENCOUNTER — Telehealth: Payer: Self-pay

## 2019-06-09 ENCOUNTER — Other Ambulatory Visit: Payer: Medicare Other

## 2019-06-09 ENCOUNTER — Telehealth: Payer: Self-pay | Admitting: Cardiology

## 2019-06-09 ENCOUNTER — Other Ambulatory Visit: Payer: Self-pay

## 2019-06-09 DIAGNOSIS — I2510 Atherosclerotic heart disease of native coronary artery without angina pectoris: Secondary | ICD-10-CM

## 2019-06-09 DIAGNOSIS — Z01810 Encounter for preprocedural cardiovascular examination: Secondary | ICD-10-CM

## 2019-06-09 DIAGNOSIS — R0609 Other forms of dyspnea: Secondary | ICD-10-CM

## 2019-06-09 DIAGNOSIS — I493 Ventricular premature depolarization: Secondary | ICD-10-CM

## 2019-06-09 LAB — HEPATIC FUNCTION PANEL
ALT: 8 IU/L (ref 0–32)
AST: 15 IU/L (ref 0–40)
Albumin: 3.7 g/dL — ABNORMAL LOW (ref 3.8–4.8)
Alkaline Phosphatase: 111 IU/L (ref 39–117)
Bilirubin Total: 0.4 mg/dL (ref 0.0–1.2)
Bilirubin, Direct: 0.16 mg/dL (ref 0.00–0.40)
Total Protein: 6.5 g/dL (ref 6.0–8.5)

## 2019-06-09 LAB — LIPID PANEL
Chol/HDL Ratio: 2.5 ratio (ref 0.0–4.4)
Cholesterol, Total: 113 mg/dL (ref 100–199)
HDL: 45 mg/dL (ref >39)
LDL Chol Calc (NIH): 55 mg/dL (ref 0–99)
Triglycerides: 58 mg/dL (ref 0–149)
VLDL Cholesterol Cal: 13 mg/dL (ref 5–40)

## 2019-06-09 MED ORDER — FUROSEMIDE 40 MG PO TABS: 40.0000 mg | ORAL_TABLET | Freq: Two times a day (BID) | ORAL | 1 refills | Status: DC

## 2019-06-09 NOTE — Telephone Encounter (Signed)
Called the pt back and informed her that per Dr Meda Coffee, she recommends that she increase her lasix to 40 mg po bid, and come in to see a PA-C this week. Pt education provided on how to correctly administer this medication twice daily. Pt states she has enough lasix 40 mg tablets on hand at this time, and will call for further refills.  Noted this to the pts pharmacy.  Scheduled the pt to come into the office to see Richardson Dopp PA-C for this Friday 9/25 at 1015.  Pt aware to arrive 15 mins prior to this appt and wear her face mask. Pt verbalized understanding and agrees with this plan.

## 2019-06-09 NOTE — Telephone Encounter (Signed)
Called pt regarding her message about swelling. She has been experiencing increased lower extremity swelling for the past week. She was hospitalized approximately one month ago and was told to take 40mg  Lasix daily and has been since discharge. She denies increased SOB, denies CP & diaphoresis. Pt reports no weight gain. She does keep her legs elevated as much as possible. Pt would like to know if she should continue taking 40mg  Lasix/daily?

## 2019-06-09 NOTE — Telephone Encounter (Signed)
-----   Message from Marie Johnson sent at 06/09/2019  7:51 AM EDT ----- Regarding: EDEMA Patient is concerned about her feet swelling. Please call her regarding any advice.

## 2019-06-09 NOTE — Telephone Encounter (Signed)
I would increase lasix to 40 mg po BID and have her come to the office ASAP - ideally this week.

## 2019-06-11 ENCOUNTER — Telehealth: Payer: Self-pay

## 2019-06-11 NOTE — Telephone Encounter (Signed)
-----   Message from Consuelo Pandy, [place] sent at 06/10/2019  7:50 PM EDT ----- Cholesterol is at goal and liver enzymes ok. Continue current treatment for cholesterol. No changes.

## 2019-06-11 NOTE — Telephone Encounter (Signed)
Notes recorded by Frederik Schmidt, RN on 06/11/2019 at 8:28 AM EDT  Lpm with results. 9/24  ------

## 2019-06-11 NOTE — Progress Notes (Signed)
Cardiology Office Note:    Date:  [id number]   ID:  [name], [name] [id number], MRN [id number]  PCP:  Philmore Pali, NP  Cardiologist:  Ena Dawley, MD  Electrophysiologist:  Thompson Grayer, MD   Referring MD: Philmore Pali, NP   Chief Complaint  Patient presents with  . Leg Swelling    History of Present Illness:    Marie Johnson is a 68 y.o. female with:  Coronary artery disease  Nonobstructive by cardiac catheterization in 2014  Dobutamine echocardiogram 7/17: Low risk  Myoview 04/2019: Low risk  Diastolic CHF  PVCs -high burden on Holter 6/17 (16%)  High risk for ablation >> treated with verapamil  Peripheral arterial disease  S/p L femoral-pop bypass  AAA  COPD  Hypertension  Hyperlipidemia  Tobacco use  Aortic atherosclerosis  [name] was last seen in July 2020 for surgical clearance.  She called in recently with leg swelling.  Her Lasix dose was increased and she was added on for evaluation.    She is here alone today.  Since increasing her Lasix, she has had just minimal improvement in her lower extremity swelling.  Her weight was up and has come down to within 2 pounds of her baseline.  She sleeps on an incline chronically.  She has had some recent symptoms that are possibly PND.  Her left leg is always greater than her right.  She had left hip surgery last month.  Her left leg has been more swollen since that time.  She has chronic left-sided chest discomfort with radiation to her left arm.  This occurs with certain activities.  She has had this for years without significant change.  She has not had syncope.  She did get lightheaded on occasion with standing quickly.    Prior CV studies:   The following studies were reviewed today:  Myoview 04/22/2019 EF 70, normal perfusion, low risk  Dobutamine stress echocardiogram 03/22/2016 Normal dobutamine echo . Ambiant PVCls at rest and with infusion  Echocardiogram 03/22/2016 Mild LVH, EF 60-65,  normal wall motion, grade 1 diastolic dysfunction, aortic sclerosis, trivial MR  Holter monitor 02/23/2016  Very frequent PVCs in pattern of bigeminy and trigeminy, total 41,000 PVCs in 48 hours.  Myoview 07/16/2013 Normal stress nuclear study.  LV Ejection Fraction: 73%  Echocardiogram 07/18/2013 Mild to moderate LVH, EF 60, normal wall motion, mild LAE, normal RV SF  Cardiac catheterization 07/17/2013 LAD proximal 30-40 LCx ostial 40; OM1 30 ostial RCA proximal 30, mid 30  Past Medical History:  Diagnosis Date  . AAA (abdominal aortic aneurysm) (HCC)    3.4 cm  . Anxiety   . Aortic atherosclerosis ([place])   . Arthritis   . Asthma   . CAD (coronary artery disease)    a. Nonobstructive CAD by cath 07/17/13.  . CHD (congenital heart disease)    pt unaware???  . Chest pain    a. Adm 10-07/2013 - CTA neg for PE/dissection, cath with nonobstructive disease, CP ?due to uncontrolled HTN vs coronary vasospasm.  [name] COPD (chronic obstructive pulmonary disease) ([place])   . GERD (gastroesophageal reflux disease)   . Hyperlipidemia   . Hypertension   . Neuropathy   . Osteoporosis   . PAD (peripheral artery disease) ([place])    a. s/p L fem-pop bypass b. recent evaluation at [facility], unamenable to intervention  . Palpitations 02/22/2016  . Premature ventricular contraction   . PUD (peptic ulcer disease)   . PVD (peripheral vascular  disease) ([place]) 07/17/2013  . Tobacco abuse    Surgical Hx: The patient  has a past surgical history that includes Vein bypass surgery; Rotator cuff repair; Wrist arthrocentesis; Hip surgery; Abdominal hysterectomy; Tubal ligation; Cardiac catheterization; left heart catheterization with coronary angiogram (N/A, 07/17/2013); and Cardiac catheterization (N/A, 07/16/2016).   Current Medications: Current Meds  Medication Sig  . albuterol (PROVENTIL HFA;VENTOLIN HFA) 108 (90 BASE) MCG/ACT inhaler Inhale 2 puffs into the lungs every 6 (six) hours as needed for wheezing.   [name] alendronate (FOSAMAX) 70 MG tablet Take 70 mg by mouth every Thursday.   [name] allopurinol (ZYLOPRIM) 300 MG tablet Take 300 mg by mouth daily.  [name] aspirin 325 MG tablet Take 325 mg by mouth daily.  [name] atorvastatin (LIPITOR) 80 MG tablet Take 1 tablet (80 mg total) by mouth daily.  [name] EPIPEN 2-PAK 0.3 MG/0.3ML SOAJ injection Inject 0.3 mg into the muscle daily as needed. Use as directed as needed for allergies.  [name] ezetimibe (ZETIA) 10 MG tablet Take 1 tablet (10 mg total) by mouth daily.  . furosemide (LASIX) 40 MG tablet Take 1 tablet (40 mg total) by mouth 2 (two) times daily.  [name] KLOR-CON M20 20 MEQ tablet TAKE ONE TABLET BY MOUTH ONCE DAILY  . Menthol-Methyl Salicylate (MUSCLE RUB) 10-15 % CREA Apply 1 application topically daily as needed (for arthritis pain).  . nitroGLYCERIN (NITROSTAT) 0.4 MG SL tablet Place 1 tablet (0.4 mg total) under the tongue every 5 (five) minutes x 3 doses as needed for chest pain.  . Oxycodone HCl 10 MG TABS Take 10 mg by mouth 3 (three) times daily.   . pantoprazole (PROTONIX) 40 MG tablet Take 40 mg by mouth 2 (two) times daily.  [name] spironolactone (ALDACTONE) 25 MG tablet Take 1 tablet (25 mg total) by mouth daily.  . verapamil (CALAN-SR) 240 MG CR tablet TAKE 1 TABLET (240 MG TOTAL) BY MOUTH AT BEDTIME.     Allergies:   Bee venom and Latex   Social History   Tobacco Use  . Smoking status: Current Every Day Smoker    Packs/day: 1.00    Types: Cigarettes  . Smokeless tobacco: Never Used  Substance Use Topics  . Alcohol use: No    Alcohol/week: 0.0 standard drinks  . Drug use: No     Family Hx: The patient's family history includes Breast cancer in her maternal grandmother; Cervical cancer in her mother; Colon cancer in her maternal aunt; Heart disease in her father and mother; Liver cancer in her father; Lung cancer in her maternal aunt, maternal aunt, maternal aunt, and maternal uncle; Prostate cancer in her maternal uncle; Stomach cancer in her maternal  aunt.  ROS:   Please see the history of present illness.    Review of Systems  Constitution: Negative for fever.  Gastrointestinal: Negative for diarrhea, hematochezia and vomiting.  Genitourinary: Negative for hematuria.   All other systems reviewed and are negative.   EKGs/Labs/Other Test Reviewed:    EKG:  EKG is  ordered today.  The ekg ordered today demonstrates normal sinus rhythm, heart rate 75, normal axis, no ST-T wave changes, QTC 435  Recent Labs: 08/22/2018: BUN 12; Creatinine, Ser 0.81; Potassium 4.3; Sodium 136 06/09/2019: ALT 8   Recent Lipid Panel Lab Results  Component Value Date/Time   CHOL 113 06/09/2019 07:59 AM   TRIG 58 06/09/2019 07:59 AM   HDL 45 06/09/2019 07:59 AM   CHOLHDL 2.5 06/09/2019 07:59 AM   CHOLHDL 3.0 02/22/2016 10:07  AM   LDLCALC 87 02/22/2016 10:07 AM     Physical Exam:    VS:  BP 130/62   Pulse 75   Ht 5\' 8"  (1.727 m)   Wt 136 lb 12.8 oz (62.1 kg)   SpO2 97%   BMI 20.80 kg/m     Wt Readings from Last 3 Encounters:  06/12/19 136 lb 12.8 oz (62.1 kg)  04/22/19 134 lb (60.8 kg)  04/14/19 134 lb 12.8 oz (61.1 kg)     Physical Exam  Constitutional: She is oriented to person, place, and time. She appears well-developed and well-nourished. No distress.  HENT:  Head: Normocephalic and atraumatic.  Eyes: No scleral icterus.  Neck: No JVD present. No thyromegaly present.  Cardiovascular: Normal rate, regular rhythm and normal heart sounds.  No murmur heard. Pulmonary/Chest: She has decreased breath sounds. She has no rales.  Abdominal: Soft. There is no hepatomegaly.  Musculoskeletal:        General: Edema (2+ L leg edema w/ chornic stasis changes; tr-1+ R leg edema;  multiple varicose veins notes bilat) present.  Lymphadenopathy:    She has no cervical adenopathy.  Neurological: She is alert and oriented to person, place, and time.  Skin: Skin is warm and dry.  Psychiatric: She has a normal mood and affect.    ASSESSMENT &  PLAN:    1. Chronic diastolic CHF (congestive heart failure) (HCC) 2. Leg swelling I suspect her leg swelling is multifactorial.  She seems to have a significant component of venous insufficiency.  She has also had worsening swelling since her hip surgery.  I suspect that this is also contributing.  Since she did have recent surgery and her left leg is larger than her right, I have suggested that we should rule out DVT.  Her lungs are clear and her neck veins are flat.  At this point, I am not convinced that she needs more Lasix.  I have recommended continued leg compression and elevation to help with her swelling.  She does have a long history of smoking and COPD.  It has been 3 years since her last echocardiogram.  Question if she has significant RV dysfunction contributing to her swelling as well.  -Continue current dose of Lasix  -Obtain BMET, BNP  -If BNP significantly elevated, increase Lasix further  -Obtain echocardiogram  -Obtain left leg venous duplex to rule out DVT  -Bilateral compression stockings  -Leg elevation  -Follow-up with Dr. Meda Coffee or APP in 2 weeks  3. Coronary artery disease involving native coronary artery of native heart, angina presence unspecified She has a history of nonobstructive coronary disease by cardiac catheterization in 2014.  Recent Myoview in August 2020 was low risk.  She has a long history of left-sided chest discomfort at times with exertion.  Her symptoms overall stable without change.  Continue aspirin, statin, calcium channel blocker.  4. PVD (peripheral vascular disease) with claudication (HCC) History of left femoral-popliteal bypass.  Continue follow-up with vascular surgery as indicated.  5. PVC (premature ventricular contraction) Large PVC burden controlled by verapamil.  She was not a candidate for ablation.  6. Essential hypertension The patient's blood pressure is controlled on her current regimen.  Continue current therapy.   7. Other  hyperlipidemia LDL optimal on most recent lab work.  Continue current Rx.     Dispo:  Return in about 2 weeks (around 06/26/2019) for Close Follow Up with Dr. Meda Coffee, or PA/NP on her team, or Richardson Dopp, PA-C.  Medication Adjustments/Labs and Tests Ordered: Current medicines are reviewed at length with the patient today.  Concerns regarding medicines are outlined above.  Tests Ordered: Orders Placed This Encounter  Procedures  . Basic metabolic panel  . Pro b natriuretic peptide (BNP)  . EKG 12-Lead  . ECHOCARDIOGRAM COMPLETE  . VAS [place] LOWER EXTREMITY VENOUS (DVT)   Medication Changes: No orders of the defined types were placed in this encounter.   Signed, Richardson Dopp, PA-C  06/12/2019 11:14 AM    [facility] [address], [place], [place]  [address] Phone: [phone]; Fax: [phone]

## 2019-06-12 ENCOUNTER — Ambulatory Visit (INDEPENDENT_AMBULATORY_CARE_PROVIDER_SITE_OTHER): Payer: Medicare Other | Admitting: Physician Assistant

## 2019-06-12 ENCOUNTER — Ambulatory Visit (HOSPITAL_COMMUNITY)
Admission: RE | Admit: 2019-06-12 | Discharge: 2019-06-12 | Disposition: A | Discharge status: Home / Self Care | Payer: Medicare Other | Source: Ambulatory Visit | Attending: Cardiology | Admitting: Cardiology

## 2019-06-12 ENCOUNTER — Encounter: Payer: Self-pay | Admitting: Physician Assistant

## 2019-06-12 ENCOUNTER — Other Ambulatory Visit: Payer: Self-pay

## 2019-06-12 VITALS — BP 130/62 | HR 75 | Ht 68.0 in | Wt 136.8 lb

## 2019-06-12 DIAGNOSIS — M7989 Other specified soft tissue disorders: Secondary | ICD-10-CM | POA: Diagnosis not present

## 2019-06-12 DIAGNOSIS — I739 Peripheral vascular disease, unspecified: Secondary | ICD-10-CM | POA: Diagnosis not present

## 2019-06-12 DIAGNOSIS — E7849 Other hyperlipidemia: Secondary | ICD-10-CM

## 2019-06-12 DIAGNOSIS — I5032 Chronic diastolic (congestive) heart failure: Secondary | ICD-10-CM

## 2019-06-12 DIAGNOSIS — I82402 Acute embolism and thrombosis of unspecified deep veins of left lower extremity: Secondary | ICD-10-CM

## 2019-06-12 DIAGNOSIS — I10 Essential (primary) hypertension: Secondary | ICD-10-CM

## 2019-06-12 DIAGNOSIS — I493 Ventricular premature depolarization: Secondary | ICD-10-CM

## 2019-06-12 DIAGNOSIS — I2510 Atherosclerotic heart disease of native coronary artery without angina pectoris: Secondary | ICD-10-CM | POA: Diagnosis not present

## 2019-06-12 NOTE — Patient Instructions (Addendum)
Medication Instructions:  Your physician recommends that you continue on your current medications as directed. Please refer to the Current Medication list given to you today.  If you need a refill on your cardiac medications before your next appointment, please call your pharmacy.   Lab work: TODAY: BMET, BNP If you have labs (blood work) drawn today and your tests are completely normal, you will receive your results only by: [name] MyChart Message (if you have MyChart) OR . A paper copy in the mail If you have any lab test that is abnormal or we need to change your treatment, we will call you to review the results.  Testing/Procedures:  Your physician has requested that you have a lower extremity venous duplex. This test is an ultrasound of the veins in the legs or arms. It looks at venous blood flow that carries blood from the heart to the legs or arms. Allow one hour for a Lower Venous exam. Allow thirty minutes for an Upper Venous exam. There are no restrictions or special instructions.  Your physician has requested that you have an echocardiogram. Echocardiography is a painless test that uses sound waves to create images of your heart. It provides your doctor with information about the size and shape of your heart and how well your heart's chambers and valves are working. This procedure takes approximately one hour. There are no restrictions for this procedure.  Follow-Up: At [facility], you and your health needs are our priority.  As part of our continuing mission to provide you with exceptional heart care, we have created designated Provider Care Teams.  These Care Teams include your primary Cardiologist (physician) and Advanced Practice Providers (APPs -  Physician Assistants and Nurse Practitioners) who all work together to provide you with the care you need, when you need it. You will need a follow up appointment in 2-3 weeks.   You may see Ena Dawley, MD or one of the following  Advanced Practice Providers on your designated Care Team:   [facility], PA-C Melina Copa, PA-C . Ermalinda Barrios, PA-C  Any Other Special Instructions Will Be Listed Below (If Applicable).  PLEASE WEAR COMPRESSING STOCKING TO HELP REDUCE SWELLING.   Echocardiogram An echocardiogram is a procedure that uses painless sound waves (ultrasound) to produce an image of the heart. Images from an echocardiogram can provide important information about:  Signs of coronary artery disease (CAD).  Aneurysm detection. An aneurysm is a weak or damaged part of an artery wall that bulges out from the normal force of blood pumping through the body.  Heart size and shape. Changes in the size or shape of the heart can be associated with certain conditions, including heart failure, aneurysm, and CAD.  Heart muscle function.  Heart valve function.  Signs of a past heart attack.  Fluid buildup around the heart.  Thickening of the heart muscle.  A tumor or infectious growth around the heart valves. Tell a health care provider about:  Any allergies you have.  All medicines you are taking, including vitamins, herbs, eye drops, creams, and over-the-counter medicines.  Any blood disorders you have.  Any surgeries you have had.  Any medical conditions you have.  Whether you are pregnant or may be pregnant. What are the risks? Generally, this is a safe procedure. However, problems may occur, including:  Allergic reaction to dye (contrast) that may be used during the procedure. What happens before the procedure? No specific preparation is needed. You may eat and drink  normally. What happens during the procedure?   An IV tube may be inserted into one of your veins.  You may receive contrast through this tube. A contrast is an injection that improves the quality of the pictures from your heart.  A gel will be applied to your chest.  A wand-like tool (transducer) will be moved over your  chest. The gel will help to transmit the sound waves from the transducer.  The sound waves will harmlessly bounce off of your heart to allow the heart images to be captured in real-time motion. The images will be recorded on a computer. The procedure may vary among health care providers and hospitals. What happens after the procedure?  You may return to your normal, everyday life, including diet, activities, and medicines, unless your health care provider tells you not to do that. Summary  An echocardiogram is a procedure that uses painless sound waves (ultrasound) to produce an image of the heart.  Images from an echocardiogram can provide important information about the size and shape of your heart, heart muscle function, heart valve function, and fluid buildup around your heart.  You do not need to do anything to prepare before this procedure. You may eat and drink normally.  After the echocardiogram is completed, you may return to your normal, everyday life, unless your health care provider tells you not to do that. This information is not intended to replace advice given to you by your health care provider. Make sure you discuss any questions you have with your health care provider. Document Released: 08/31/2000 Document Revised: 12/25/2018 Document Reviewed: 10/06/2016 Elsevier Patient Education  2020 [employer].

## 2019-06-13 LAB — BASIC METABOLIC PANEL
BUN/Creatinine Ratio: 12 (ref 12–28)
BUN: 11 mg/dL (ref 8–27)
CO2: 25 mmol/L (ref 20–29)
Calcium: 9.2 mg/dL (ref 8.7–10.3)
Chloride: 95 mmol/L — ABNORMAL LOW (ref 96–106)
Creatinine, Ser: 0.89 mg/dL (ref 0.57–1.00)
GFR calc Af Amer: 76 mL/min/{1.73_m2} (ref >59)
GFR calc non Af Amer: 66 mL/min/{1.73_m2} (ref >59)
Glucose: 80 mg/dL (ref 65–99)
Potassium: 4.5 mmol/L (ref 3.5–5.2)
Sodium: 135 mmol/L (ref 134–144)

## 2019-06-13 LAB — PRO B NATRIURETIC PEPTIDE: NT-Pro BNP: 311 pg/mL — ABNORMAL HIGH (ref 0–301)

## 2019-06-16 ENCOUNTER — Ambulatory Visit: Payer: Medicare Other | Admitting: Vascular Surgery

## 2019-06-16 ENCOUNTER — Other Ambulatory Visit: Payer: Self-pay

## 2019-06-16 ENCOUNTER — Encounter: Payer: Self-pay | Admitting: Vascular Surgery

## 2019-06-16 VITALS — BP 131/66 | HR 76 | Temp 97.8°F | Resp 18 | Ht 68.0 in | Wt 136.3 lb

## 2019-06-16 DIAGNOSIS — I739 Peripheral vascular disease, unspecified: Secondary | ICD-10-CM | POA: Diagnosis not present

## 2019-06-16 DIAGNOSIS — M7989 Other specified soft tissue disorders: Secondary | ICD-10-CM

## 2019-06-16 NOTE — Progress Notes (Signed)
[facility] [place]  Patient name: Marie Johnson MRN: [id number] DOB: 10/07/1950 Sex: female  REASON FOR VISIT: Valuation left lower extremity swelling  HPI: Marie Johnson is a 68 y.o. female here today for evaluation.  Her schedule was for preoperative clearance.  There may have been some confusion regarding timing.  She actually had redo left hip surgery 5 weeks ago.  She had had hip surgery 30 years ago and had recurrent issues and underwent extensive hip repair on the left 5 weeks ago and [facility].  She has had persistent swelling since the procedure.  She does report tenderness in her left leg.  She does have a long history of congestive heart failure and has had issues with volume overload in the past but this is particularly her left leg.  The patient is well-known to me from prior left femoral to above-knee popliteal bypass with vein between 15 and 20 years ago.  She has had no difficulty with this and is always maintained patency.  Past Medical History:  Diagnosis Date  . AAA (abdominal aortic aneurysm) (HCC)    3.4 cm  . Anxiety   . Aortic atherosclerosis ([place])   . Arthritis   . Asthma   . CAD (coronary artery disease)    a. Nonobstructive CAD by cath 07/17/13.  . CHD (congenital heart disease)    pt unaware???  . Chest pain    a. Adm 10-07/2013 - CTA neg for PE/dissection, cath with nonobstructive disease, CP ?due to uncontrolled HTN vs coronary vasospasm.  [name] COPD (chronic obstructive pulmonary disease) ([place])   . GERD (gastroesophageal reflux disease)   . Hyperlipidemia   . Hypertension   . Neuropathy   . Osteoporosis   . PAD (peripheral artery disease) ([place])    a. s/p L fem-pop bypass b. recent evaluation at [facility], unamenable to intervention  . Palpitations 02/22/2016  . Premature ventricular contraction   . PUD (peptic ulcer disease)   . PVD (peripheral vascular disease) ([place]) 07/17/2013  .  Tobacco abuse     Family History  Problem Relation Age of Onset  . Cervical cancer Mother   . Heart disease Mother        MI in 56s  . Liver cancer Father   . Heart disease Father   . Lung cancer Maternal Aunt   . Prostate cancer Maternal Uncle        met. anal  . Breast cancer Maternal Grandmother   . Lung cancer Maternal Aunt   . Lung cancer Maternal Aunt   . Stomach cancer Maternal Aunt   . Colon cancer Maternal Aunt   . Lung cancer Maternal Uncle     SOCIAL HISTORY: Social History   Tobacco Use  . Smoking status: Current Every Day Smoker    Packs/day: 0.50    Types: Cigarettes  . Smokeless tobacco: Never Used  Substance Use Topics  . Alcohol use: No    Alcohol/week: 0.0 standard drinks    Allergies  Allergen Reactions  . Bee Venom Anaphylaxis  . Latex Rash    Current Outpatient Medications  Medication Sig Dispense Refill  . albuterol (PROVENTIL HFA;VENTOLIN HFA) 108 (90 BASE) MCG/ACT inhaler Inhale 2 puffs into the lungs every 6 (six) hours as needed for wheezing.    [name] alendronate (FOSAMAX) 70 MG tablet Take 70 mg by mouth every Thursday.     [name] allopurinol (ZYLOPRIM) 300 MG tablet Take 300 mg by mouth daily.    [name]  aspirin 325 MG tablet Take 325 mg by mouth daily.    [name] atorvastatin (LIPITOR) 80 MG tablet Take 1 tablet (80 mg total) by mouth daily. 90 tablet 3  . EPIPEN 2-PAK 0.3 MG/0.3ML SOAJ injection Inject 0.3 mg into the muscle daily as needed. Use as directed as needed for allergies.    [name] ezetimibe (ZETIA) 10 MG tablet Take 1 tablet (10 mg total) by mouth daily. 90 tablet 3  . furosemide (LASIX) 40 MG tablet Take 1 tablet (40 mg total) by mouth 2 (two) times daily. 60 tablet 1  . KLOR-CON M20 20 MEQ tablet TAKE ONE TABLET BY MOUTH ONCE DAILY 90 tablet 2  . Menthol-Methyl Salicylate (MUSCLE RUB) 10-15 % CREA Apply 1 application topically daily as needed (for arthritis pain).    . nitroGLYCERIN (NITROSTAT) 0.4 MG SL tablet Place 1 tablet (0.4 mg total) under  the tongue every 5 (five) minutes x 3 doses as needed for chest pain. 25 tablet 4  . Oxycodone HCl 10 MG TABS Take 10 mg by mouth 3 (three) times daily.     . pantoprazole (PROTONIX) 40 MG tablet Take 40 mg by mouth 2 (two) times daily.    [name] spironolactone (ALDACTONE) 25 MG tablet Take 1 tablet (25 mg total) by mouth daily. 90 tablet 3  . verapamil (CALAN-SR) 240 MG CR tablet TAKE 1 TABLET (240 MG TOTAL) BY MOUTH AT BEDTIME. 90 tablet 1   No current facility-administered medications for this visit.    Facility-Administered Medications Ordered in Other Visits  Medication Dose Route Frequency Provider Last Rate Last Dose  . DOBUTamine (DOBUTREX) 1,000 mcg/mL in dextrose 5% 250 mL infusion  30 mcg/kg/min Intravenous Titrated Dorothy Spark, MD        REVIEW OF SYSTEMS:  _0  denotes positive finding, _1  denotes negative finding Cardiac  Comments:  Chest pain or chest pressure:    Shortness of breath upon exertion: x   Short of breath when lying flat:    Irregular heart rhythm:        Vascular    Pain in calf, thigh, or hip brought on by ambulation:    Pain in feet at night that wakes you up from your sleep:     Blood clot in your veins:    Leg swelling:  x         PHYSICAL EXAM: Vitals:   06/16/19 0905  BP: 131/66  Pulse: 76  Resp: 18  Temp: 97.8 F (36.6 C)  SpO2: 97%  Weight: 136 lb 4.8 oz (61.8 kg)  Height: _2  (1.727 m)    GENERAL: The patient is a well-nourished female, in no acute distress. The vital signs are documented above. CARDIOVASCULAR: I do palpate a left popliteal pulse.  She has marked swelling in her foot I do not palpate pedal pulses.  She has biphasic dorsalis pedis and peroneal signal on the left.  Does have marked swelling in the left mostly from her above-knee position down into her foot.  She does have some swelling in her right foot as well.  She does have scattered telangiectasia suggesting venous hypertension bilaterally PULMONARY: There is  good air exchange  MUSCULOSKELETAL: There are no major deformities or cyanosis. NEUROLOGIC: No focal weakness or paresthesias are detected. SKIN: There are no ulcers or rashes noted. PSYCHIATRIC: The patient has a normal affect.  DATA:  Venous duplex yesterday was reviewed.  This showed no evidence of DVT left leg  MEDICAL ISSUES: I feel  that this is related to her hip surgery and probable lymphatic disruption.  Fortunately DVT has been ruled out yesterday.  She continues to have excellent arterial flow.  She has had attempts at compression therapy in the past but this is been very difficult to to due to tenderness.  I explained the critical importance of elevation and also today we will wrap her with a 6 inch Ace wrap beginning on her foot and extending up to her calf.  Once the swelling is better she will try a knee-high compression.  We will see [place] again on an as-needed basis    Rosetta Posner, MD [facility] Tel [phone] Pager [phone]

## 2019-06-23 ENCOUNTER — Encounter: Payer: Self-pay | Admitting: Physician Assistant

## 2019-06-23 ENCOUNTER — Other Ambulatory Visit: Payer: Self-pay

## 2019-06-23 ENCOUNTER — Ambulatory Visit (HOSPITAL_COMMUNITY): Payer: Medicare Other | Attending: Cardiology

## 2019-06-23 DIAGNOSIS — I5032 Chronic diastolic (congestive) heart failure: Secondary | ICD-10-CM

## 2019-06-23 DIAGNOSIS — M7989 Other specified soft tissue disorders: Secondary | ICD-10-CM

## 2019-06-30 ENCOUNTER — Ambulatory Visit: Payer: Medicare Other | Admitting: Physician Assistant

## 2019-06-30 NOTE — Progress Notes (Deleted)
Cardiology Office Note:    Date:  67/20/9470   ID:  Marie Johnson [birth year], MRN [id number]  PCP:  Philmore Pali, NP  Cardiologist:  Ena Dawley, MD *** Electrophysiologist:  Thompson Grayer, MD   Referring MD: Philmore Pali, NP   No chief complaint on file. ***  History of Present Illness:    Marie Johnson is a 68 y.o. female with:   Coronary artery disease ? Nonobstructive by cardiac catheterization in 2014 ? Dobutamine echocardiogram 7/17: Low risk ? Myoview 04/2019: Low risk  Diastolic CHF  PVCs -high burden on Holter 6/17 (16%) ? High risk for ablation >> treated with verapamil  Peripheral arterial disease ? S/p L femoral-pop bypass  AAA  COPD  Hypertension  Hyperlipidemia  Tobacco use  Aortic atherosclerosis  Marie Johnson was last seen 06/12/2019 with lower extremity swelling.  I suspect that her leg swelling is multifactorial and related to a component of venous insufficiency as well as residual swelling from recent hip surgery.  Venous ultrasound was negative for DVT on the left.  proBNP was only 311.  An Echocardiogram demonstrated normal LV function with mild diastolic dysfunction and normal RV systolic function.  RVSP was mildly elevated at 30.7.  She returns for follow-up.  The DICTATELATER SmartLink is not supported in this context. ***  Prior CV studies:   The following studies were reviewed today:  Echocardiogram 06/23/2019 EF 60-65, impaired relaxation (grade 1 diastolic dysfunction), mild MAC, mild MR, mild to moderate aortic sclerosis without stenosis, trivial TR, normal RV SF, mild LAE, RVSP 30.7  Venous US 06/12/2019 Summary: Right: No evidence of common femoral vein obstruction. Left: No evidence of deep vein thrombosis in the lower extremity. No indirect evidence of obstruction proximal to the inguinal ligament. No cystic structure found in the popliteal fossa.  Myoview 04/22/2019 EF 70, normal perfusion, low risk  Dobutamine stress  echocardiogram 03/22/2016 Normal dobutamine echo . Ambiant PVCls at rest and with infusion  Echocardiogram 03/22/2016 Mild LVH, EF 60-65, normal wall motion, grade 1 diastolic dysfunction, aortic sclerosis, trivial MR  Holter monitor 02/23/2016  Very frequent PVCs in pattern of bigeminy and trigeminy, total 41,000 PVCs in 48 hours.  Myoview 07/16/2013 Normal stress nuclear study.  LV Ejection Fraction: 73%  Echocardiogram 07/18/2013 Mild to moderate LVH, EF 60, normal wall motion, mild LAE, normal RV SF  Cardiac catheterization 07/17/2013 LAD proximal 30-40 LCx ostial 40; OM1 30 ostial RCA proximal 30, mid 30  Past Medical History:  Diagnosis Date  . AAA (abdominal aortic aneurysm) (HCC)    3.4 cm  . Anxiety   . Aortic atherosclerosis ([place])   . Arthritis   . Asthma   . CAD (coronary artery disease)    a. Nonobstructive CAD by cath 07/17/13.  . CHD (congenital heart disease)    pt unaware???  . Chest pain    a. Adm 10-07/2013 - CTA neg for PE/dissection, cath with nonobstructive disease, CP ?due to uncontrolled HTN vs coronary vasospasm.  [name] COPD (chronic obstructive pulmonary disease) ([place])   . Echocardiogram    Echocardiogram 06/2019: EF 60-65, impaired relaxation (Gr 1 DD), mild MAC, mild MR, mild to mod aortic valve sclerosis (no AS), trivial TR, mild LAE, normal RVSF, RVSP 30.7 (mildly elevated)  . GERD (gastroesophageal reflux disease)   . Hyperlipidemia   . Hypertension   . Neuropathy   . Osteoporosis   . PAD (peripheral artery disease) ([place])    a. s/p L fem-pop bypass b.  recent evaluation at [facility], unamenable to intervention  . Palpitations 02/22/2016  . Premature ventricular contraction   . PUD (peptic ulcer disease)   . PVD (peripheral vascular disease) ([place]) 07/17/2013  . Tobacco abuse    Surgical Hx: The patient  has a past surgical history that includes Vein bypass surgery; Rotator cuff repair; Wrist arthrocentesis; Hip surgery; Abdominal hysterectomy; Tubal  ligation; Cardiac catheterization; left heart catheterization with coronary angiogram (N/A, 07/17/2013); and Cardiac catheterization (N/A, 07/16/2016).   Current Medications: No outpatient medications have been marked as taking for the 06/30/19 encounter (Appointment) with Richardson Dopp T, PA-C.     Allergies:   Bee venom and Latex   Social History   Tobacco Use  . Smoking status: Current Every Day Smoker    Packs/day: 0.50    Types: Cigarettes  . Smokeless tobacco: Never Used  Substance Use Topics  . Alcohol use: No    Alcohol/week: 0.0 standard drinks  . Drug use: No     Family Hx: The patient's family history includes Breast cancer in her maternal grandmother; Cervical cancer in her mother; Colon cancer in her maternal aunt; Heart disease in her father and mother; Liver cancer in her father; Lung cancer in her maternal aunt, maternal aunt, maternal aunt, and maternal uncle; Prostate cancer in her maternal uncle; Stomach cancer in her maternal aunt.  ROS:   Please see the history of present illness.    ROS All other systems reviewed and are negative.   EKGs/Labs/Other Test Reviewed:    EKG:  EKG is *** ordered today.  The ekg ordered today demonstrates ***  Recent Labs: 06/09/2019: ALT 8 06/12/2019: BUN 11; Creatinine, Ser 0.89; NT-Pro BNP 311; Potassium 4.5; Sodium 135   Recent Lipid Panel Lab Results  Component Value Date/Time   CHOL 113 06/09/2019 07:59 AM   TRIG 58 06/09/2019 07:59 AM   HDL 45 06/09/2019 07:59 AM   CHOLHDL 2.5 06/09/2019 07:59 AM   CHOLHDL 3.0 02/22/2016 10:07 AM   LDLCALC 55 06/09/2019 07:59 AM    Physical Exam:    VS:  There were no vitals taken for this visit.    Wt Readings from Last 3 Encounters:  06/16/19 136 lb 4.8 oz (61.8 kg)  06/12/19 136 lb 12.8 oz (62.1 kg)  04/22/19 134 lb (60.8 kg)     ***Physical Exam  ASSESSMENT & PLAN:    *** 1. Chronic diastolic CHF (congestive heart failure) (HCC) 2. Leg swelling I suspect her  leg swelling is multifactorial.  She seems to have a significant component of venous insufficiency.  She has also had worsening swelling since her hip surgery.  I suspect that this is also contributing.  Since she did have recent surgery and her left leg is larger than her right, I have suggested that we should rule out DVT.  Her lungs are clear and her neck veins are flat.  At this point, I am not convinced that she needs more Lasix.  I have recommended continued leg compression and elevation to help with her swelling.  She does have a long history of smoking and COPD.  It has been 3 years since her last echocardiogram.  Question if she has significant RV dysfunction contributing to her swelling as well.             -Continue current dose of Lasix             -Obtain BMET, BNP             -  If BNP significantly elevated, increase Lasix further             -Obtain echocardiogram             -Obtain left leg venous duplex to rule out DVT             -Bilateral compression stockings             -Leg elevation             -Follow-up with Dr. Meda Coffee or APP in 2 weeks  3. Coronary artery disease involving native coronary artery of native heart, angina presence unspecified She has a history of nonobstructive coronary disease by cardiac catheterization in 2014.  Recent Myoview in August 2020 was low risk.  She has a long history of left-sided chest discomfort at times with exertion.  Her symptoms overall stable without change.  Continue aspirin, statin, calcium channel blocker.  4. PVD (peripheral vascular disease) with claudication (HCC) History of left femoral-popliteal bypass.  Continue follow-up with vascular surgery as indicated.  5. PVC (premature ventricular contraction) Large PVC burden controlled by verapamil.  She was not a candidate for ablation.  6. Essential hypertension The patient's blood pressure is controlled on her current regimen.  Continue current therapy.   7. Other  hyperlipidemia LDL optimal on most recent lab work.  Continue current Rx.    Dispo:  No follow-ups on file.   Medication Adjustments/Labs and Tests Ordered: Current medicines are reviewed at length with the patient today.  Concerns regarding medicines are outlined above.  Tests Ordered: No orders of the defined types were placed in this encounter.  Medication Changes: No orders of the defined types were placed in this encounter.   Signed, Richardson Dopp, PA-C  06/30/2019 8:00 AM    [facility] [address], [place], [place]  [address] Phone: [phone]; Fax: [phone]

## 2019-07-27 NOTE — Progress Notes (Signed)
Cardiology Office Note:    Date:  76/72/0947   ID:  Leaann, Nevils [birth year], MRN [id number]  PCP:  Philmore Pali, NP  Cardiologist:  Ena Dawley, MD  Electrophysiologist:  Thompson Grayer, MD   Referring MD: Philmore Pali, NP   Chief Complaint  Patient presents with  . Follow-up    CHF, leg swelling    History of Present Illness:    OZELLE BRUBACHER is a 68 y.o. female with:   Coronary artery disease ? Nonobstructive by cardiac catheterization in 2014 ? Dobutamine echocardiogram 7/17: Low risk ? Myoview 04/2019: Low risk  Diastolic CHF  PVCs -high burden on Holter 6/17 (16%) ? High risk for ablation >> treated with verapamil  Peripheral arterial disease ? S/p L femoral-pop bypass  AAA  COPD  Hypertension  Hyperlipidemia  Tobacco use  Aortic atherosclerosis  Ms. Maret was last seen in September 2020 for leg swelling.  This was felt to be multifactorial and related to venous insufficiency as well as recent hip surgery.  BNP was checked and it was not significantly elevated.  A follow-up echocardiogram demonstrated normal LV and RV function.  Venous duplex was negative for left lower extremity DVT.  She saw Dr. Donnetta Hutching with [facility] for her leg swelling and he also felt that her swelling was related to lymphatic compression from her recent hip surgery.    She returns for follow-up.  She is here alone.  She notes significant leg cramps, especially at night.  She continues to have issues with leg swelling.  She has difficulty keeping them elevated.  She either wraps her legs with Ace wrap or wears knee-high compression stockings.  Her breathing is the same.  She has not had chest discomfort or syncope.  She sometimes gets dizzy when she stands.  Her blood pressures at home range in the 503 systolic.  She sleeps on an incline chronically.  Her left leg is always larger than her right.   Prior CV studies:   The following studies were reviewed today:  Echocardiogram 06/23/2019  EF 60-65, impaired relaxation, mild MAC, mild MR, trivial AI, mild to moderate aortic sclerosis, no aortic stenosis, mild LAE, RVSP 30.7  Myoview 04/22/2019 EF 70, normal perfusion, low risk  Dobutamine stress echocardiogram 03/22/2016 Normal dobutamine echo . Ambiant PVCls at rest and with infusion  Echocardiogram 03/22/2016 Mild LVH, EF 60-65, normal wall motion, grade 1 diastolic dysfunction, aortic sclerosis, trivial MR  Holter monitor 02/23/2016  Very frequent PVCs in pattern of bigeminy and trigeminy, total 41,000 PVCs in 48 hours.  Myoview 07/16/2013 Normal stress nuclear study.  LV Ejection Fraction: 73%  Echocardiogram 07/18/2013 Mild to moderate LVH, EF 60, normal wall motion, mild LAE, normal RV SF  Cardiac catheterization 07/17/2013 LAD proximal 30-40 LCx ostial 40; OM1 30 ostial RCA proximal 30, mid 30  Past Medical History:  Diagnosis Date  . AAA (abdominal aortic aneurysm) (HCC)    3.4 cm  . Anxiety   . Aortic atherosclerosis ([place])   . Arthritis   . Asthma   . CAD (coronary artery disease)    a. Nonobstructive CAD by cath 07/17/13.  . CHD (congenital heart disease)    pt unaware???  . Chest pain    a. Adm 10-07/2013 - CTA neg for PE/dissection, cath with nonobstructive disease, CP ?due to uncontrolled HTN vs coronary vasospasm.  [name] COPD (chronic obstructive pulmonary disease) ([place])   . Echocardiogram    Echocardiogram 06/2019: EF 60-65, impaired relaxation (Gr  1 DD), mild MAC, mild MR, mild to mod aortic valve sclerosis (no AS), trivial TR, mild LAE, normal RVSF, RVSP 30.7 (mildly elevated)  . GERD (gastroesophageal reflux disease)   . Hyperlipidemia   . Hypertension   . Neuropathy   . Osteoporosis   . PAD (peripheral artery disease) ([place])    a. s/p L fem-pop bypass b. recent evaluation [facility], unamenable to intervention  . Palpitations 02/22/2016  . Premature ventricular contraction   . PUD (peptic ulcer disease)   . PVD (peripheral vascular disease)  ([place]) 07/17/2013  . Tobacco abuse    Surgical Hx: The patient  has a past surgical history that includes Vein bypass surgery; Rotator cuff repair; Wrist arthrocentesis; Hip surgery; Abdominal hysterectomy; Tubal ligation; Cardiac catheterization; left heart catheterization with coronary angiogram (N/A, 07/17/2013); and Cardiac catheterization (N/A, 07/16/2016).   Current Medications: Current Meds  Medication Sig  . albuterol (PROVENTIL HFA;VENTOLIN HFA) 108 (90 BASE) MCG/ACT inhaler Inhale 2 puffs into the lungs every 6 (six) hours as needed for wheezing.  [name] alendronate (FOSAMAX) 70 MG tablet Take 70 mg by mouth every Thursday.   [name] allopurinol (ZYLOPRIM) 300 MG tablet Take 300 mg by mouth daily.  [name] aspirin EC 81 MG tablet Take 81 mg by mouth daily.  [name] atorvastatin (LIPITOR) 80 MG tablet Take 1 tablet (80 mg total) by mouth daily.  [name] EPIPEN 2-PAK 0.3 MG/0.3ML SOAJ injection Inject 0.3 mg into the muscle daily as needed. Use as directed as needed for allergies.  [name] ezetimibe (ZETIA) 10 MG tablet Take 1 tablet (10 mg total) by mouth daily.  . furosemide (LASIX) 40 MG tablet Take 1 tablet (40 mg total) by mouth daily.  [name] KLOR-CON M20 20 MEQ tablet TAKE ONE TABLET BY MOUTH ONCE DAILY  . Menthol-Methyl Salicylate (MUSCLE RUB) 10-15 % CREA Apply 1 application topically daily as needed (for arthritis pain).  . nitroGLYCERIN (NITROSTAT) 0.4 MG SL tablet Place 1 tablet (0.4 mg total) under the tongue every 5 (five) minutes x 3 doses as needed for chest pain.  . Oxycodone HCl 10 MG TABS Take 10 mg by mouth 3 (three) times daily.   . pantoprazole (PROTONIX) 40 MG tablet Take 40 mg by mouth 2 (two) times daily.  [name] spironolactone (ALDACTONE) 25 MG tablet Take 1 tablet (25 mg total) by mouth daily.  . verapamil (CALAN-SR) 240 MG CR tablet TAKE 1 TABLET (240 MG TOTAL) BY MOUTH AT BEDTIME.  . [DISCONTINUED] furosemide (LASIX) 40 MG tablet Take 1 tablet (40 mg total) by mouth 2 (two) times daily.      Allergies:   Bee venom and Latex   Social History   Tobacco Use  . Smoking status: Current Every Day Smoker    Packs/day: 0.50    Types: Cigarettes  . Smokeless tobacco: Never Used  Substance Use Topics  . Alcohol use: No    Alcohol/week: 0.0 standard drinks  . Drug use: No     Family Hx: The patient's family history includes Breast cancer in her maternal grandmother; Cervical cancer in her mother; Colon cancer in her maternal aunt; Heart disease in her father and mother; Liver cancer in her father; Lung cancer in her maternal aunt, maternal aunt, maternal aunt, and maternal uncle; Prostate cancer in her maternal uncle; Stomach cancer in her maternal aunt.  ROS:   Please see the history of present illness.    Review of Systems  Gastrointestinal: Negative for hematochezia.  Genitourinary: Negative for hematuria.   All  other systems reviewed and are negative.   EKGs/Labs/Other Test Reviewed:    EKG:  EKG is not ordered today.  The ekg ordered today demonstrates n/a  Recent Labs: 06/09/2019: ALT 8 06/12/2019: BUN 11; Creatinine, Ser 0.89; NT-Pro BNP 311; Potassium 4.5; Sodium 135   Recent Lipid Panel Lab Results  Component Value Date/Time   CHOL 113 06/09/2019 07:59 AM   TRIG 58 06/09/2019 07:59 AM   HDL 45 06/09/2019 07:59 AM   CHOLHDL 2.5 06/09/2019 07:59 AM   CHOLHDL 3.0 02/22/2016 10:07 AM   LDLCALC 55 06/09/2019 07:59 AM    Physical Exam:    VS:  BP (!) 146/68   Pulse 74   Ht _0  (1.753 m)   Wt 133 lb (60.3 kg)   BMI 19.64 kg/m     Wt Readings from Last 3 Encounters:  07/28/19 133 lb (60.3 kg)  06/16/19 136 lb 4.8 oz (61.8 kg)  06/12/19 136 lb 12.8 oz (62.1 kg)     Physical Exam  Constitutional: She is oriented to person, place, and time. She appears well-developed and well-nourished.  HENT:  Head: Normocephalic and atraumatic.  Eyes: No scleral icterus.  Neck: No thyromegaly present.  Cardiovascular: Normal rate and regular rhythm.  No murmur  heard. Pulmonary/Chest: She has decreased breath sounds. She has no rales.  Abdominal: Soft.  Musculoskeletal:        General: Edema (1+ L LE edema, trace R LE edema) present.  Lymphadenopathy:    She has no cervical adenopathy.  Neurological: She is alert and oriented to person, place, and time.  Skin: Skin is warm and dry.  Psychiatric: She has a normal mood and affect.    ASSESSMENT & PLAN:    1. Leg swelling Leg swelling is multifactorial and related to lymphatic compression from recent hip surgery as well as venous insufficiency in addition to chronic diastolic heart failure.  She notes issues with leg cramping as well as some lightheadedness in the setting of low blood pressure at home.  Therefore, I will reduce her Lasix to 40 mg daily.  She knows to increase her Lasix back to 40 mg twice a day if her weight should increase or she develops worsening swelling or worsening shortness of breath.  Continue leg elevation and compression.  Check BMET, Mg2+ today.    2. Chronic diastolic CHF (congestive heart failure) ([place]) As noted, she has had some lower blood pressures at home as well as lightheadedness associated with this.  She also notes significant leg cramping.  We will try to reduce her Lasix to 40 mg daily as outlined above.  I have advised her to weigh her self daily and to take extra Lasix if her weight should go up by 3 pounds or more in a day or if she develops worsening swelling or worsening shortness of breath.  3. Coronary artery disease involving native coronary artery of native heart, angina presence unspecified Nonobstructive coronary disease by cardiac catheterization in 2014.  Myoview in August 2020 was low risk.  She has not had recent chest discomfort.  Continue aspirin, statin.  4. PVD (peripheral vascular disease) with claudication (HCC) History of left femoral to popliteal bypass.  Continue follow-up with vascular surgery as indicated.  5. Essential hypertension  Blood pressure elevated today.  However blood pressures at home are typically optimal.  Reduce Lasix as outlined above for symptomatic low blood pressure.  6.  PVCs Managed with verapamil.  7.  Abdominal aortic aneurysm This is  followed by primary care.  We will request the most recent AAA ultrasound from [facility]:  Return in about 6 months (around 01/25/2020) for Routine Follow Up w/ Dr. Meda Coffee, (virtual or in-person).   Medication Adjustments/Labs and Tests Ordered: Current medicines are reviewed at length with the patient today.  Concerns regarding medicines are outlined above.  Tests Ordered: Orders Placed This Encounter  Procedures  . Basic metabolic panel  . Magnesium   Medication Changes: Meds ordered this encounter  Medications  . furosemide (LASIX) 40 MG tablet    Sig: Take 1 tablet (40 mg total) by mouth daily.    Dispense:  30 tablet    Refill:  6    PT HAS ENOUGH ON HAND AND WILL CALL FOR FURTHER REFILLS    Signed, Richardson Dopp, PA-C  07/28/2019 9:27 AM    [address], [place], [place]  [address] Phone: [phone]; Fax: [phone]

## 2019-07-28 ENCOUNTER — Encounter: Payer: Self-pay | Admitting: Physician Assistant

## 2019-07-28 ENCOUNTER — Other Ambulatory Visit: Payer: Self-pay

## 2019-07-28 ENCOUNTER — Ambulatory Visit: Payer: Medicare Other | Admitting: Physician Assistant

## 2019-07-28 VITALS — BP 146/68 | HR 74 | Ht 69.0 in | Wt 133.0 lb

## 2019-07-28 DIAGNOSIS — I2510 Atherosclerotic heart disease of native coronary artery without angina pectoris: Secondary | ICD-10-CM | POA: Diagnosis not present

## 2019-07-28 DIAGNOSIS — I5032 Chronic diastolic (congestive) heart failure: Secondary | ICD-10-CM

## 2019-07-28 DIAGNOSIS — I10 Essential (primary) hypertension: Secondary | ICD-10-CM

## 2019-07-28 DIAGNOSIS — M7989 Other specified soft tissue disorders: Secondary | ICD-10-CM

## 2019-07-28 DIAGNOSIS — I714 Abdominal aortic aneurysm, without rupture, unspecified: Secondary | ICD-10-CM

## 2019-07-28 DIAGNOSIS — I739 Peripheral vascular disease, unspecified: Secondary | ICD-10-CM

## 2019-07-28 DIAGNOSIS — I493 Ventricular premature depolarization: Secondary | ICD-10-CM

## 2019-07-28 LAB — BASIC METABOLIC PANEL
BUN/Creatinine Ratio: 12 (ref 12–28)
BUN: 10 mg/dL (ref 8–27)
CO2: 21 mmol/L (ref 20–29)
Calcium: 9.1 mg/dL (ref 8.7–10.3)
Chloride: 99 mmol/L (ref 96–106)
Creatinine, Ser: 0.83 mg/dL (ref 0.57–1.00)
GFR calc Af Amer: 83 mL/min/{1.73_m2} (ref >59)
GFR calc non Af Amer: 72 mL/min/{1.73_m2} (ref >59)
Glucose: 89 mg/dL (ref 65–99)
Potassium: 3.7 mmol/L (ref 3.5–5.2)
Sodium: 135 mmol/L (ref 134–144)

## 2019-07-28 LAB — MAGNESIUM: Magnesium: 1.8 mg/dL (ref 1.6–2.3)

## 2019-07-28 MED ORDER — FUROSEMIDE 40 MG PO TABS: 40.0000 mg | ORAL_TABLET | Freq: Every day | ORAL | 6 refills | Status: DC

## 2019-07-28 NOTE — Patient Instructions (Addendum)
Medication Instructions:   START TAKING : LASIX 40 MG ONCE A DAY   PLEASE MAKE SURE YOU WEIGH YOURSELF DAILY IF WEIGHT GAIN 3LBS OR MORE IN ONE DAY  YOU  MAY TAKE AN EXTRA LASIX 40 MG   *If you need a refill on your cardiac medications before your next appointment, please call your pharmacy*  Lab Work:  BMET AND [facility]   If you have labs (blood work) drawn today and your tests are completely normal, you will receive your results only by: [name] MyChart Message (if you have MyChart) OR . A paper copy in the mail If you have any lab test that is abnormal or we need to change your treatment, we will call you to review the results.  Testing/Procedures: NONE ORDERED  TODAY  Follow-Up: At [facility], you and your health needs are our priority.  As part of our continuing mission to provide you with exceptional heart care, we have created designated Provider Care Teams.  These Care Teams include your primary Cardiologist (physician) and Advanced Practice Providers (APPs -  Physician Assistants and Nurse Practitioners) who all work together to provide you with the care you need, when you need it.  Your next appointment:   6 months  The format for your next appointment:   In Person  Provider:   You may see Ena Dawley, MD or one of the following Advanced Practice Providers on your designated Care Team:    Melina Copa, PA-C  Ermalinda Barrios, PA-C   Other Instructions

## 2019-07-30 ENCOUNTER — Other Ambulatory Visit: Payer: Self-pay | Admitting: Cardiology

## 2019-07-30 DIAGNOSIS — E7849 Other hyperlipidemia: Secondary | ICD-10-CM

## 2019-07-30 DIAGNOSIS — I2510 Atherosclerotic heart disease of native coronary artery without angina pectoris: Secondary | ICD-10-CM

## 2019-07-30 DIAGNOSIS — I10 Essential (primary) hypertension: Secondary | ICD-10-CM

## 2019-07-30 DIAGNOSIS — M791 Myalgia, unspecified site: Secondary | ICD-10-CM

## 2019-07-30 DIAGNOSIS — I739 Peripheral vascular disease, unspecified: Secondary | ICD-10-CM

## 2019-09-24 ENCOUNTER — Telehealth: Payer: Self-pay

## 2019-09-24 NOTE — Telephone Encounter (Signed)
   [facility] Pre-operative Risk Assessment    Request for surgical clearance:  1. What type of surgery is being performed? COLONOSCOPY/ENDOSCOPY  2. When is this surgery scheduled? TBD   3. What type of clearance is required (medical clearance vs. Pharmacy clearance to hold med vs. Both)? BOTH  4. Are there any medications that need to be held prior to surgery and how long? ASA    5. Practice name and name of physician performing surgery? [facility]   6. What is your office phone number [phone]    7.   What is your office fax number [phone]  8.   Anesthesia type (None, local, MAC, general) ? NONE LISTED    Jacinta Shoe 09/24/2019, 5:11 PM  _________________________________________________________________   (provider comments below)

## 2019-09-25 NOTE — Telephone Encounter (Signed)
   Primary Cardiologist: Ena Dawley, MD  Chart reviewed as part of pre-operative protocol coverage. Patient was contacted 09/25/2019 in reference to pre-operative risk assessment for pending surgery as outlined below.  Marie Johnson was last seen on 07/28/19 by Richardson Dopp, PA-C.  Since that day, Marie Johnson has done fine from a cardiac standpoint. She has chronic SOB which is unchanged in recent months. Mobility is limited by LE pain/claudication. That being said, she can still complete 4 METs without anginal complaints.  Therefore, based on ACC/AHA guidelines, the patient would be at acceptable risk for the planned procedure without further cardiovascular testing.   Patient can hold aspirin 7 days prior to her upcoming colonoscopy and restart as soon as she is cleared to do so by her gastroenterologist.   I will route this recommendation to the requesting party via [place] fax function and remove from pre-op pool.  Please call with questions.  Abigail Butts, PA-C 09/25/2019, 9:31 AM

## 2019-11-07 ENCOUNTER — Other Ambulatory Visit: Payer: Self-pay | Admitting: Cardiology

## 2019-11-07 DIAGNOSIS — I493 Ventricular premature depolarization: Secondary | ICD-10-CM

## 2019-11-07 DIAGNOSIS — I2510 Atherosclerotic heart disease of native coronary artery without angina pectoris: Secondary | ICD-10-CM

## 2019-11-07 DIAGNOSIS — M791 Myalgia, unspecified site: Secondary | ICD-10-CM

## 2019-11-07 DIAGNOSIS — I739 Peripheral vascular disease, unspecified: Secondary | ICD-10-CM

## 2019-11-07 DIAGNOSIS — I10 Essential (primary) hypertension: Secondary | ICD-10-CM

## 2019-11-07 DIAGNOSIS — E7849 Other hyperlipidemia: Secondary | ICD-10-CM

## 2019-11-23 DIAGNOSIS — Z1159 Encounter for screening for other viral diseases: Secondary | ICD-10-CM | POA: Diagnosis not present

## 2019-11-24 DIAGNOSIS — I5032 Chronic diastolic (congestive) heart failure: Secondary | ICD-10-CM | POA: Diagnosis not present

## 2019-11-24 DIAGNOSIS — G8929 Other chronic pain: Secondary | ICD-10-CM | POA: Diagnosis not present

## 2019-11-24 DIAGNOSIS — M549 Dorsalgia, unspecified: Secondary | ICD-10-CM | POA: Diagnosis not present

## 2019-11-24 DIAGNOSIS — I739 Peripheral vascular disease, unspecified: Secondary | ICD-10-CM | POA: Diagnosis not present

## 2019-11-26 DIAGNOSIS — K6389 Other specified diseases of intestine: Secondary | ICD-10-CM | POA: Diagnosis not present

## 2019-11-26 DIAGNOSIS — D124 Benign neoplasm of descending colon: Secondary | ICD-10-CM | POA: Diagnosis not present

## 2019-11-26 DIAGNOSIS — D122 Benign neoplasm of ascending colon: Secondary | ICD-10-CM | POA: Diagnosis not present

## 2019-11-26 DIAGNOSIS — K5730 Diverticulosis of large intestine without perforation or abscess without bleeding: Secondary | ICD-10-CM | POA: Diagnosis not present

## 2019-11-26 DIAGNOSIS — Z86010 Personal history of colonic polyps: Secondary | ICD-10-CM | POA: Diagnosis not present

## 2019-11-26 DIAGNOSIS — D120 Benign neoplasm of cecum: Secondary | ICD-10-CM | POA: Diagnosis not present

## 2019-12-01 DIAGNOSIS — D120 Benign neoplasm of cecum: Secondary | ICD-10-CM | POA: Diagnosis not present

## 2019-12-01 DIAGNOSIS — D122 Benign neoplasm of ascending colon: Secondary | ICD-10-CM | POA: Diagnosis not present

## 2019-12-01 DIAGNOSIS — D124 Benign neoplasm of descending colon: Secondary | ICD-10-CM | POA: Diagnosis not present

## 2019-12-01 DIAGNOSIS — K6389 Other specified diseases of intestine: Secondary | ICD-10-CM | POA: Diagnosis not present

## 2019-12-25 DIAGNOSIS — G8929 Other chronic pain: Secondary | ICD-10-CM | POA: Diagnosis not present

## 2019-12-25 DIAGNOSIS — R7303 Prediabetes: Secondary | ICD-10-CM | POA: Diagnosis not present

## 2019-12-25 DIAGNOSIS — Z79899 Other long term (current) drug therapy: Secondary | ICD-10-CM | POA: Diagnosis not present

## 2019-12-25 DIAGNOSIS — I739 Peripheral vascular disease, unspecified: Secondary | ICD-10-CM | POA: Diagnosis not present

## 2019-12-25 DIAGNOSIS — M549 Dorsalgia, unspecified: Secondary | ICD-10-CM | POA: Diagnosis not present

## 2020-01-25 DIAGNOSIS — L570 Actinic keratosis: Secondary | ICD-10-CM | POA: Diagnosis not present

## 2020-01-25 DIAGNOSIS — D692 Other nonthrombocytopenic purpura: Secondary | ICD-10-CM | POA: Diagnosis not present

## 2020-01-25 DIAGNOSIS — L821 Other seborrheic keratosis: Secondary | ICD-10-CM | POA: Diagnosis not present

## 2020-01-27 DIAGNOSIS — G8929 Other chronic pain: Secondary | ICD-10-CM | POA: Diagnosis not present

## 2020-01-27 DIAGNOSIS — M549 Dorsalgia, unspecified: Secondary | ICD-10-CM | POA: Diagnosis not present

## 2020-01-27 DIAGNOSIS — R7303 Prediabetes: Secondary | ICD-10-CM | POA: Diagnosis not present

## 2020-01-27 DIAGNOSIS — I739 Peripheral vascular disease, unspecified: Secondary | ICD-10-CM | POA: Diagnosis not present

## 2020-01-27 DIAGNOSIS — Z79899 Other long term (current) drug therapy: Secondary | ICD-10-CM | POA: Diagnosis not present

## 2020-02-16 DIAGNOSIS — E7800 Pure hypercholesterolemia, unspecified: Secondary | ICD-10-CM | POA: Diagnosis not present

## 2020-02-16 DIAGNOSIS — E559 Vitamin D deficiency, unspecified: Secondary | ICD-10-CM | POA: Diagnosis not present

## 2020-02-16 DIAGNOSIS — R7303 Prediabetes: Secondary | ICD-10-CM | POA: Diagnosis not present

## 2020-02-16 DIAGNOSIS — M109 Gout, unspecified: Secondary | ICD-10-CM | POA: Diagnosis not present

## 2020-03-01 DIAGNOSIS — G8929 Other chronic pain: Secondary | ICD-10-CM | POA: Diagnosis not present

## 2020-03-01 DIAGNOSIS — I739 Peripheral vascular disease, unspecified: Secondary | ICD-10-CM | POA: Diagnosis not present

## 2020-03-01 DIAGNOSIS — M549 Dorsalgia, unspecified: Secondary | ICD-10-CM | POA: Diagnosis not present

## 2020-03-01 DIAGNOSIS — Z9181 History of falling: Secondary | ICD-10-CM | POA: Diagnosis not present

## 2020-03-01 DIAGNOSIS — R7303 Prediabetes: Secondary | ICD-10-CM | POA: Diagnosis not present

## 2020-03-08 ENCOUNTER — Emergency Department (HOSPITAL_BASED_OUTPATIENT_CLINIC_OR_DEPARTMENT_OTHER): Payer: Medicare Other

## 2020-03-08 ENCOUNTER — Observation Stay (HOSPITAL_COMMUNITY)
Admission: EM | Admit: 2020-03-08 | Discharge: 2020-03-10 | Disposition: A | Discharge status: Home / Self Care | Payer: Medicare Other | Attending: Vascular Surgery | Admitting: Vascular Surgery

## 2020-03-08 ENCOUNTER — Other Ambulatory Visit: Payer: Self-pay

## 2020-03-08 DIAGNOSIS — I70223 Atherosclerosis of native arteries of extremities with rest pain, bilateral legs: Secondary | ICD-10-CM | POA: Diagnosis not present

## 2020-03-08 DIAGNOSIS — Z7982 Long term (current) use of aspirin: Secondary | ICD-10-CM | POA: Insufficient documentation

## 2020-03-08 DIAGNOSIS — F17200 Nicotine dependence, unspecified, uncomplicated: Secondary | ICD-10-CM | POA: Insufficient documentation

## 2020-03-08 DIAGNOSIS — Z79899 Other long term (current) drug therapy: Secondary | ICD-10-CM | POA: Insufficient documentation

## 2020-03-08 DIAGNOSIS — R58 Hemorrhage, not elsewhere classified: Secondary | ICD-10-CM | POA: Diagnosis not present

## 2020-03-08 DIAGNOSIS — I70229 Atherosclerosis of native arteries of extremities with rest pain, unspecified extremity: Secondary | ICD-10-CM | POA: Diagnosis present

## 2020-03-08 DIAGNOSIS — I2510 Atherosclerotic heart disease of native coronary artery without angina pectoris: Secondary | ICD-10-CM | POA: Diagnosis not present

## 2020-03-08 DIAGNOSIS — Z9889 Other specified postprocedural states: Secondary | ICD-10-CM | POA: Diagnosis present

## 2020-03-08 DIAGNOSIS — R6889 Other general symptoms and signs: Secondary | ICD-10-CM | POA: Diagnosis not present

## 2020-03-08 DIAGNOSIS — J449 Chronic obstructive pulmonary disease, unspecified: Secondary | ICD-10-CM | POA: Insufficient documentation

## 2020-03-08 DIAGNOSIS — Z91040 Latex allergy status: Secondary | ICD-10-CM | POA: Diagnosis not present

## 2020-03-08 DIAGNOSIS — Z743 Need for continuous supervision: Secondary | ICD-10-CM | POA: Diagnosis not present

## 2020-03-08 DIAGNOSIS — I358 Other nonrheumatic aortic valve disorders: Secondary | ICD-10-CM | POA: Diagnosis not present

## 2020-03-08 DIAGNOSIS — I739 Peripheral vascular disease, unspecified: Secondary | ICD-10-CM | POA: Diagnosis not present

## 2020-03-08 DIAGNOSIS — I83892 Varicose veins of left lower extremities with other complications: Secondary | ICD-10-CM | POA: Diagnosis not present

## 2020-03-08 DIAGNOSIS — Z886 Allergy status to analgesic agent status: Secondary | ICD-10-CM | POA: Insufficient documentation

## 2020-03-08 DIAGNOSIS — I83899 Varicose veins of unspecified lower extremities with other complications: Secondary | ICD-10-CM

## 2020-03-08 DIAGNOSIS — Z01810 Encounter for preprocedural cardiovascular examination: Secondary | ICD-10-CM

## 2020-03-08 DIAGNOSIS — I998 Other disorder of circulatory system: Secondary | ICD-10-CM | POA: Diagnosis not present

## 2020-03-08 DIAGNOSIS — K219 Gastro-esophageal reflux disease without esophagitis: Secondary | ICD-10-CM | POA: Insufficient documentation

## 2020-03-08 DIAGNOSIS — Z20822 Contact with and (suspected) exposure to covid-19: Secondary | ICD-10-CM | POA: Insufficient documentation

## 2020-03-08 DIAGNOSIS — Z8249 Family history of ischemic heart disease and other diseases of the circulatory system: Secondary | ICD-10-CM | POA: Insufficient documentation

## 2020-03-08 DIAGNOSIS — R9431 Abnormal electrocardiogram [ECG] [EKG]: Secondary | ICD-10-CM | POA: Diagnosis not present

## 2020-03-08 DIAGNOSIS — Z959 Presence of cardiac and vascular implant and graft, unspecified: Secondary | ICD-10-CM | POA: Diagnosis not present

## 2020-03-08 DIAGNOSIS — R52 Pain, unspecified: Secondary | ICD-10-CM | POA: Diagnosis not present

## 2020-03-08 HISTORY — DX: Unspecified osteoarthritis, unspecified site: M19.90

## 2020-03-08 HISTORY — DX: Atherosclerotic heart disease of native coronary artery without angina pectoris: I25.10

## 2020-03-08 LAB — CBC
HCT: 34.4 % — ABNORMAL LOW (ref 36.0–46.0)
Hemoglobin: 11 g/dL — ABNORMAL LOW (ref 12.0–15.0)
MCH: 29.7 pg (ref 26.0–34.0)
MCHC: 32 g/dL (ref 30.0–36.0)
MCV: 93 fL (ref 80.0–100.0)
Platelets: 266 10*3/uL (ref 150–400)
RBC: 3.7 MIL/uL — ABNORMAL LOW (ref 3.87–5.11)
RDW: 23 % — ABNORMAL HIGH (ref 11.5–15.5)
WBC: 7.1 10*3/uL (ref 4.0–10.5)
nRBC: 0.3 % — ABNORMAL HIGH (ref 0.0–0.2)

## 2020-03-08 LAB — BASIC METABOLIC PANEL
Anion gap: 12 (ref 5–15)
BUN: 16 mg/dL (ref 8–23)
CO2: 20 mmol/L — ABNORMAL LOW (ref 22–32)
Calcium: 8.7 mg/dL — ABNORMAL LOW (ref 8.9–10.3)
Chloride: 106 mmol/L (ref 98–111)
Creatinine, Ser: 0.87 mg/dL (ref 0.44–1.00)
GFR calc Af Amer: >60 mL/min (ref >60)
GFR calc non Af Amer: >60 mL/min (ref >60)
Glucose, Bld: 113 mg/dL — ABNORMAL HIGH (ref 70–99)
Potassium: 3.5 mmol/L (ref 3.5–5.1)
Sodium: 138 mmol/L (ref 135–145)

## 2020-03-08 LAB — URINALYSIS, ROUTINE W REFLEX MICROSCOPIC
Bilirubin Urine: NEGATIVE
Glucose, UA: NEGATIVE mg/dL
Hgb urine dipstick: NEGATIVE
Ketones, ur: NEGATIVE mg/dL
Leukocytes,Ua: NEGATIVE
Nitrite: NEGATIVE
Protein, ur: NEGATIVE mg/dL
Specific Gravity, Urine: 1.018 (ref 1.005–1.030)
pH: 5 (ref 5.0–8.0)

## 2020-03-08 LAB — CK: Total CK: 207 U/L (ref 38–234)

## 2020-03-08 LAB — PROTIME-INR
INR: 1.1 (ref 0.8–1.2)
Prothrombin Time: 13.7 seconds (ref 11.4–15.2)

## 2020-03-08 LAB — SARS CORONAVIRUS 2 BY RT PCR (HOSPITAL ORDER, PERFORMED IN ~~LOC~~ HOSPITAL LAB): SARS Coronavirus 2: NEGATIVE

## 2020-03-08 LAB — SURGICAL PCR SCREEN
MRSA, PCR: NEGATIVE
Staphylococcus aureus: NEGATIVE

## 2020-03-08 MED ORDER — HYDROCODONE-ACETAMINOPHEN 5-325 MG PO TABS
2.0000 | ORAL_TABLET | Freq: Once | ORAL | Status: AC
  Administered 2020-03-08: 2 via ORAL
  Filled 2020-03-08: qty 2

## 2020-03-08 MED ORDER — BISACODYL 10 MG RE SUPP: 10.0000 mg | Freq: Every day | RECTAL | Status: DC | PRN

## 2020-03-08 MED ORDER — GUAIFENESIN-DM 100-10 MG/5ML PO SYRP
15.0000 mL | ORAL_SOLUTION | ORAL | Status: DC | PRN
  Administered 2020-03-08 – 2020-03-10 (×2): 15 mL via ORAL
  Filled 2020-03-08 (×2): qty 15

## 2020-03-08 MED ORDER — LABETALOL HCL 5 MG/ML IV SOLN: 10.0000 mg | INTRAVENOUS | Status: DC | PRN

## 2020-03-08 MED ORDER — HYDRALAZINE HCL 20 MG/ML IJ SOLN: 5.0000 mg | INTRAMUSCULAR | Status: DC | PRN

## 2020-03-08 MED ORDER — ALBUTEROL SULFATE (2.5 MG/3ML) 0.083% IN NEBU: 3.0000 mL | INHALATION_SOLUTION | RESPIRATORY_TRACT | Status: DC | PRN

## 2020-03-08 MED ORDER — SODIUM CHLORIDE 0.9% FLUSH
3.0000 mL | Freq: Two times a day (BID) | INTRAVENOUS | Status: DC
  Administered 2020-03-08 – 2020-03-09 (×3): 3 mL via INTRAVENOUS

## 2020-03-08 MED ORDER — ACETAMINOPHEN 325 MG RE SUPP
325.0000 mg | RECTAL | Status: DC | PRN
  Filled 2020-03-08: qty 2

## 2020-03-08 MED ORDER — ACETAMINOPHEN 325 MG PO TABS: 325.0000 mg | ORAL_TABLET | ORAL | Status: DC | PRN

## 2020-03-08 MED ORDER — PHENOL 1.4 % MT LIQD
1.0000 | OROMUCOSAL | Status: DC | PRN
  Filled 2020-03-08: qty 177

## 2020-03-08 MED ORDER — ONDANSETRON HCL 4 MG/2ML IJ SOLN: 4.0000 mg | Freq: Four times a day (QID) | INTRAMUSCULAR | Status: DC | PRN

## 2020-03-08 MED ORDER — ATORVASTATIN CALCIUM 80 MG PO TABS
80.0000 mg | ORAL_TABLET | Freq: Every day | ORAL | Status: DC
  Administered 2020-03-08 – 2020-03-10 (×2): 80 mg via ORAL
  Filled 2020-03-08 (×2): qty 1

## 2020-03-08 MED ORDER — PANTOPRAZOLE SODIUM 40 MG PO TBEC
40.0000 mg | DELAYED_RELEASE_TABLET | Freq: Every day | ORAL | Status: DC
  Administered 2020-03-08 – 2020-03-10 (×2): 40 mg via ORAL
  Filled 2020-03-08 (×2): qty 1

## 2020-03-08 MED ORDER — METOPROLOL TARTRATE 5 MG/5ML IV SOLN: 2.0000 mg | INTRAVENOUS | Status: DC | PRN

## 2020-03-08 MED ORDER — SODIUM CHLORIDE 0.9% FLUSH: 3.0000 mL | INTRAVENOUS | Status: DC | PRN

## 2020-03-08 MED ORDER — ALUM & MAG HYDROXIDE-SIMETH 200-200-20 MG/5ML PO SUSP: 15.0000 mL | ORAL | Status: DC | PRN

## 2020-03-08 MED ORDER — POTASSIUM CHLORIDE CRYS ER 20 MEQ PO TBCR
20.0000 meq | EXTENDED_RELEASE_TABLET | Freq: Once | ORAL | Status: AC
  Administered 2020-03-08: 20 meq via ORAL
  Filled 2020-03-08: qty 1

## 2020-03-08 MED ORDER — POLYETHYLENE GLYCOL 3350 17 G PO PACK: 17.0000 g | PACK | Freq: Every day | ORAL | Status: DC | PRN

## 2020-03-08 MED ORDER — SODIUM CHLORIDE 0.9 % IV SOLN: 250.0000 mL | INTRAVENOUS | Status: DC | PRN

## 2020-03-08 MED ORDER — MORPHINE SULFATE (PF) 2 MG/ML IV SOLN
2.0000 mg | INTRAVENOUS | Status: DC | PRN
  Administered 2020-03-08 – 2020-03-09 (×6): 2 mg via INTRAVENOUS
  Filled 2020-03-08 (×6): qty 1

## 2020-03-08 MED ORDER — OXYCODONE HCL 5 MG PO TABS
5.0000 mg | ORAL_TABLET | Freq: Four times a day (QID) | ORAL | Status: DC | PRN
  Administered 2020-03-08 – 2020-03-09 (×3): 5 mg via ORAL
  Filled 2020-03-08 (×4): qty 1

## 2020-03-08 MED ORDER — MORPHINE SULFATE (PF) 4 MG/ML IV SOLN
4.0000 mg | Freq: Once | INTRAVENOUS | Status: AC
  Administered 2020-03-08: 4 mg via INTRAVENOUS
  Filled 2020-03-08: qty 1

## 2020-03-08 NOTE — ED Provider Notes (Signed)
[facility] EMERGENCY DEPARTMENT Provider Note   CSN: [id number] Arrival date & time: 03/08/20  3710     History No chief complaint on file.   Marie Johnson is a 69 y.o. female.  HPI Patient was shaving this morning at about 6:45 AM. She cut her ankle on the medial aspect of the left lower leg. Patient takes a daily aspirin. 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 She reports that she did not immediately realize that she had done it. She was in talking to her husband trying to get ready to take him to the doctor when she noticed she was standing in a pool of blood. They tried multiple compressions at home. Her husband wrapped an Ace wrap tightly above the wound trying to compress the leg. Continue to bleed and sometimes spurted blood. They were unable to get it to stop. EMS was called. EMS reports there was already dressing over the active bleeding area they placed a tourniquet below the knee. EMS administered  1 gram TXA, Ancef, Fentanyl and Zofran, 500cc NS. Patient did not have any syncopal event. EMS estimated by observation that the patient had lost [phone] cc of blood.  Patient reports she is otherwise been well. She has COPD. She reports she has chronic smoker's cough. No changes. No shortness of breath or chest pain.    No past medical history on file.  There are no problems to display for this patient.      OB History   No obstetric history on file.     No family history on file.  Social History   Tobacco Use  . Smoking status: Not on file  Substance Use Topics  . Alcohol use: Not on file  . Drug use: Not on file    Home Medications Prior to Admission medications   Not on File    Allergies    Patient has no known allergies.  Review of Systems   Review of Systems 10 systems reviewed and negative except as per HPI Physical Exam Updated Vital Signs BP 123/66   Pulse 82   Temp 97.6 F (36.4 C) (Tympanic)   Resp 18   Ht 5\' 8"  (1.727 m)    Wt 59 kg   SpO2 96%   BMI 19.77 kg/m   Physical Exam Constitutional:      Comments: Alert and nontoxic. Clinically well and appeared. No respiratory distress.  HENT:     Head: Normocephalic and atraumatic.  Eyes:     Extraocular Movements: Extraocular movements intact.  Cardiovascular:     Rate and Rhythm: Normal rate and regular rhythm.  Pulmonary:     Comments: No respiratory distress. Intermittent wet cough. Diffuse scattered wheeze in the lung fields, airflow to the bases. Abdominal:     General: There is no distension.     Palpations: Abdomen is soft.     Tenderness: There is no abdominal tenderness. There is no guarding.  Musculoskeletal:     Comments: Patient arrives with a tourniquet below the left knee. There is a blood saturated dressing with a very large clot on the lower leg and ankle. Toes are violaceous and dusky in appearance. Once dressing and tourniquet removed, there is a pinpoint area of bleeding on the medial ankle. Trickle of blood without pulsatile leading. Numerous small varicosities of the ankle and foot. Right lower extremity without edema. Many small varicosities. Manually and by Doppler I cannot establish pulses on either foot. Pulses are present but soft by  Doppler at the popliteal pulse on the left. Once tourniquet removed, left lower extremity has mild edema, 1+ relative to the right. Foot and lower leg now mildly erythematous diffusely.  Skin:    General: Skin is warm and dry.  Neurological:     General: No focal deficit present.     Mental Status: She is oriented to person, place, and time.     Motor: No weakness.     Coordination: Coordination normal.  Psychiatric:        Mood and Affect: Mood normal.     ED Results / Procedures / Treatments   Labs (all labs ordered are listed, but only abnormal results are displayed) Labs Reviewed  CBC - Abnormal; Notable for the following components:      Result Value   RBC 3.70 (*)    Hemoglobin 11.0  (*)    HCT 34.4 (*)    RDW 23.0 (*)    nRBC 0.3 (*)    All other components within normal limits  BASIC METABOLIC PANEL - Abnormal; Notable for the following components:   CO2 20 (*)    Glucose, Bld 113 (*)    Calcium 8.7 (*)    All other components within normal limits  PROTIME-INR  CK    EKG EKG Interpretation  Date/Time:  Tuesday March 08 2020 09:19:31 EDT Ventricular Rate:  97 PR Interval:    QRS Duration: 83 QT Interval:  348 QTC Calculation: 442 R Axis:   54 Text Interpretation: Sinus rhythm poor r wave progression o/w normal no old comparison Confirmed by Charlesetta Shanks [phone]) on 03/08/2020 2:27:41 PM   Radiology VAS [place] LOWER EXTREMITY ARTERIAL DUPLEX  Result Date: 03/08/2020 LOWER EXTREMITY ARTERIAL DUPLEX STUDY Indications: Assess flow after tourniquet removal. High Risk Factors: None.  Current ABI: Comparison Study: No prior studies. Performing Technologist: Oliver Hum RVT  Examination Guidelines: A complete evaluation includes B-mode imaging, spectral Doppler, color Doppler, and power Doppler as needed of all accessible portions of each vessel. Bilateral testing is considered an integral part of a complete examination. Limited examinations for reoccurring indications may be performed as noted.  +-----------+--------+-----+--------+----------+--------+ LEFT       PSV cm/sRatioStenosisWaveform  Comments +-----------+--------+-----+--------+----------+--------+ CFA Distal                      monophasic         +-----------+--------+-----+--------+----------+--------+ POP Mid                         monophasic         +-----------+--------+-----+--------+----------+--------+ ATA Prox                        monophasic         +-----------+--------+-----+--------+----------+--------+ ATA Mid                         monophasic         +-----------+--------+-----+--------+----------+--------+ ATA Distal                      monophasic          +-----------+--------+-----+--------+----------+--------+ PTA Prox                        monophasic         +-----------+--------+-----+--------+----------+--------+ PTA Mid  monophasic         +-----------+--------+-----+--------+----------+--------+ PTA Distal                      monophasic         +-----------+--------+-----+--------+----------+--------+ PERO Prox                       monophasic         +-----------+--------+-----+--------+----------+--------+ PERO Mid                        monophasic         +-----------+--------+-----+--------+----------+--------+ PERO Distal                     monophasic         +-----------+--------+-----+--------+----------+--------+ DP                              monophasic         +-----------+--------+-----+--------+----------+--------+  Left Graft #1: +--------------------+--------+--------+----------+--------+                     PSV cm/sStenosisWaveform  Comments +--------------------+--------+--------+----------+--------+ Inflow                                                 +--------------------+--------+--------+----------+--------+ Proximal Anastomosis                                   +--------------------+--------+--------+----------+--------+ Proximal Graft                      monophasic         +--------------------+--------+--------+----------+--------+ Mid Graft                           monophasic         +--------------------+--------+--------+----------+--------+ Distal Graft                        monophasic         +--------------------+--------+--------+----------+--------+ Distal Anastomosis                                     +--------------------+--------+--------+----------+--------+ Outflow                                                +--------------------+--------+--------+----------+--------+   Summary: Left:  Post below the knee tourniquet removal, arterial flow is detected in the calf arteries. Incidental findings of monophasic flow throughout arterial system including femoral to popliteal bypass graft.  See table(s) above for measurements and observations.    Preliminary     Procedures Procedures (including critical care time) [place] and dressing removed from EMS. Punctate area of bleeding, combat gauze pack applied and Curlex and Ace wrap applied with ankle bracing including foot. Moderate to light pressure without tourniquet effect. Medications Ordered in ED Medications  HYDROcodone-acetaminophen (NORCO/VICODIN) 5-325 MG per tablet 2 tablet (2 tablets Oral Given  03/08/20 1221)  morphine 4 MG/ML injection 4 mg (4 mg Intravenous Given 03/08/20 1251)    ED Course  I have reviewed the triage vital signs and the nursing notes.  Pertinent labs & imaging results that were available during my care of the patient were reviewed by me and considered in my medical decision making (see chart for details).    MDM Rules/Calculators/A&P                         Consult: Vascular surgery.  Patient seen and evaluated and determined significant arterial disease necessitating further diagnostic evaluation and admission.  Patient presents after a minor cut to a varicose vein while shaving.  She had bleeding that she could not get to resolve at home.  EMS found that there was extensive on bleeding and placed tourniquet on the lower leg and administered TXA.  Once reexamined, there was no significant ongoing bleeding.  I was able to apply combat gauze over a small oozing varicosity.  Dressed without significant compression and no further bleeding through.  Of concern in the patient's management was known severe peripheral vascular disease and possible exacerbated arterial flow to the extremity with previous tourniquet application.  I could not palpate pulses then obtained arterial Dopplers.  After vascular surgery  evaluated the patient, they determined she had significant overall risk of arterial occlusions with pre-existing rest pain and atherosclerotic disease and determined to admit the patient for further diagnostic evaluation and management. Final Clinical Impression(s) / ED Diagnoses Final diagnoses:  Bleeding from varicose vein  Peripheral artery disease ([place])    Rx / DC Orders ED Discharge Orders    None       Charlesetta Shanks, MD 03/12/20 1517

## 2020-03-08 NOTE — ED Notes (Signed)
Tourniquet removed

## 2020-03-08 NOTE — ED Notes (Signed)
Attempted to call report, RN will return call to ED in 10 min.

## 2020-03-08 NOTE — Progress Notes (Signed)
Lower extremity vein mapping complete.  Please see CV Proc tab for details. [place], RVT 2:54 PM  03/08/2020

## 2020-03-08 NOTE — ED Notes (Signed)
Report given to 4 E

## 2020-03-08 NOTE — Progress Notes (Signed)
Orthopedic Tech Progress Note Patient Details:  Marie Johnson [birth year] [id number] Level 2 trauma Patient ID: Marie Johnson, female   DOB: December 22, 1950, 69 y.o.   MRN: [id number]   Janit Pagan 03/08/2020, 9:26 AM

## 2020-03-08 NOTE — ED Notes (Signed)
Combat gauze dressing applied

## 2020-03-08 NOTE — ED Triage Notes (Addendum)
Pt arrives [facility] after cutting herself while shaving her legs this morning. Per patient husband held pressure for one hour prior to calling EMS with no control. Per patient blood spurting in the air. EMS reports approx [phone] cc blood. Tourniquet applied at 0840. 1 G TXA, Ancef, 100 mcg Fentanyl, 4mg  zofran, and 500 cc NS given PTA.

## 2020-03-08 NOTE — Progress Notes (Signed)
Left lower extremity arterial duplex has been completed. Preliminary results can be found in CV Proc through chart review.  Results were given to Dr. Johnney Killian.  03/08/20 11:15 AM Carlos Levering RVT

## 2020-03-08 NOTE — H&P (Addendum)
Hospital Consult    Reason for Consult:  Bleeding varicosity Requesting Physician:  Johnney Killian MRN #:  [id number]  History of Present Illness: This is a 69 y.o. female who is known to our office for PAD with hx of previous left femoral to popliteal artery bypass grafting by Dr. Donnetta Hutching about 15-20 years ago.  She has also been seen by Dr. Donnetta Hutching for leg swelling in September 2020.  At the time of that visit, she was noted to have good blood flow.    Pt states she was shaving her legs this morning and knicked one of her varicose veins.  She states that they tried to put pressure on this, but it continued to bleed.  She states that her husband wrapped it as tight as he could with what sounds like an ace bandage but continued to bleed so EMS was called.  A tourniquet was placed on her leg by EMS and it continued to bleed.  The tourniquet was reported to be in place ~ 40 minutes.  She states these veins have not ever bled before.  She states that her leg does feel a little funny but motor and sensation are in tact.    While checking her foot for doppler signals, she cannot tolerate her leg being up on the bed.  She tells me that she pushes her husband in the wheel chair down the ramp and she has to stop bc she gets cramps in her calves with left worse than right.  She states that at night, she cannot tolerate her legs up and has to sleep in her recliner so that her feet will hang over the bed.  She states standing also helps with the pain.  She does not have any non healing wounds on either feet.   She had ABI's in February 2020, which revealed 0.63 with biphasic waveform on the right and 0.89 with monophasic (PT) and biphasic (DP) on the left.   +-------+-----------+-----------+------------+------------+  ABI/TBIToday's ABIToday's TBIPrevious ABIPrevious TBI  +-------+-----------+-----------+------------+------------+  Right 0.63    0.44    0.88    0.51        +-------+-----------+-----------+------------+------------+  Left  0.89    0.98    1.09    0.62      +-------+-----------+-----------+------------+------------+   She does have swelling in her legs with left greater than right.  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    She states she has hx of CAD, but has not had an MI or any chest pain.  She has NTG, but has not had to take any.  She states she does have COPD and asthma.  She has remote hx of stomach ulcer.  She denies any melena or hematochezia or hematuria.    The pt is on a statin for cholesterol management.  The pt is on a daily aspirin.   Other AC:  none The pt is on CCB for hypertension.   The pt is not diabetic.   Tobacco hx:  current  PMH: 1.  PAD with hx of left femoral to popliteal bypass grafting 2.  CAD 3.  COPD/asthma 4.  GERD 5.  Hx heart failure 6.  Hypertension   Medications: albuterol (PROVENTIL HFA;VENTOLIN HFA) 108 (90 BASE) MCG/ACT inhaler alendronate (FOSAMAX) 70 MG tablet allopurinol (ZYLOPRIM) 300 MG tablet aspirin EC 81 MG tablet atorvastatin (LIPITOR) 80 MG tablet EPIPEN 2-PAK 0.3 MG/0.3ML SOAJ injection ezetimibe (ZETIA) 10 MG tablet furosemide (LASIX) 40 MG tablet  KLOR-CON M20 20 MEQ tablet Menthol-Methyl Salicylate (MUSCLE RUB) 10-15 % CREA nitroGLYCERIN (NITROSTAT) 0.4 MG SL tablet Oxycodone HCl 10 MG TABS pantoprazole (PROTONIX) 40 MG tablet spironolactone (ALDACTONE) 25 MG tablet verapamil (CALAN-SR) 240 MG CR tablet   Allergies: Bee Venom  Latex  Social Hx: Married Current smoker  Family Hx: Mother and father with hx of heart disease; mother with hx of MI in 39's.  Both parents deceased. Multiple relatives with hx of cancer   ROS: [x]  Positive   [ ]  Negative   [ ]  All sytems reviewed and are negative  Cardiac: []  chest pain/pressure []  palpitations []  SOB lying flat [x]  DOE  Vascular: [x]  pain in legs while  walking [x]  pain in legs at rest [x]  pain in legs at night [x]  bleeding varicosity []  non-healing ulcers []  hx of DVT [x]  swelling in legs [x]  varicose veins  Pulmonary: []  productive cough []  asthma/wheezing []  home O2  Neurologic: []  weakness in []  arms []  legs []  numbness in []  arms []  legs []  hx of CVA []  mini stroke [] difficulty speaking or slurred speech []  temporary loss of vision in one eye [x]  dizziness  Hematologic: [x]  bleeding problems   Endocrine:   []  diabetes []  thyroid disease  GI []  blood in stool  GU: []  CKD/renal failure []  HD--[]  M/W/F or []  T/T/S []  blood in urine  Psychiatric: []  anxiety []  depression  Musculoskeletal: []  arthritis []  joint pain  Integumentary: []  rashes []  ulcers  Constitutional: []  fever []  chills   Physical Examination  Vitals:   03/08/20 1213 03/08/20 1215  BP: 117/72 123/66  Pulse: 82 82  Resp: 20 18  Temp:    SpO2: 99% 96%   Body mass index is 19.77 kg/m.  General:  WDWN in NAD Gait: Not observed HENT: WNL, normocephalic Pulmonary: normal non-labored breathing, without Rales, rhonchi,  wheezing Cardiac: regular without carotid bruits Abdomen:  soft, NT/ND, no masses Skin: without rashes Vascular Exam/Pulses:  Right Left  Radial 2+ (normal) 2+ (normal)  Femoral 2+ (normal) 2+ (normal)  DP Faint monophasic Faint monophasic  PT absent absent  Peroneal absent absent   Extremities: superficial varicosities BLE;  Dressing removed from LLE and small area of bleeding continues-mild compression dressing replaced around ankle.  Anterior and lateral compartments are soft on the left; calf is also soft; motor and sensory are in tact;  Well healed scars on left hip. Musculoskeletal: no muscle wasting or atrophy  Neurologic: A&O X 3;  No focal weakness or paresthesias are detected; speech is fluent/normal Psychiatric:  The pt has Normal affect.   CBC    Component Value Date/Time   WBC 7.1 03/08/2020  0920   RBC 3.70 (L) 03/08/2020 0920   HGB 11.0 (L) 03/08/2020 0920   HCT 34.4 (L) 03/08/2020 0920   PLT 266 03/08/2020 0920   MCV 93.0 03/08/2020 0920   MCH 29.7 03/08/2020 0920   MCHC 32.0 03/08/2020 0920   RDW 23.0 (H) 03/08/2020 0920    BMET    Component Value Date/Time   NA 138 03/08/2020 1300   K 3.5 03/08/2020 1300   CL 106 03/08/2020 1300   CO2 20 (L) 03/08/2020 1300   GLUCOSE 113 (H) 03/08/2020 1300   BUN 16 03/08/2020 1300   CREATININE 0.87 03/08/2020 1300   CALCIUM 8.7 (L) 03/08/2020 1300   GFRNONAA >60 03/08/2020 1300   GFRAA >60 03/08/2020 1300    COAGS: Lab Results  Component Value Date   INR 1.1 03/08/2020  Non-Invasive Vascular Imaging:   +-----------+--------+-----+--------+----------+--------+  LEFT    PSV cm/sRatioStenosisWaveform Comments  +-----------+--------+-----+--------+----------+--------+  CFA Distal            monophasic      +-----------+--------+-----+--------+----------+--------+  POP Mid             monophasic      +-----------+--------+-----+--------+----------+--------+  ATA Prox             monophasic      +-----------+--------+-----+--------+----------+--------+  ATA Mid             monophasic      +-----------+--------+-----+--------+----------+--------+  ATA Distal            monophasic      +-----------+--------+-----+--------+----------+--------+  PTA Prox             monophasic      +-----------+--------+-----+--------+----------+--------+  PTA Mid             monophasic      +-----------+--------+-----+--------+----------+--------+  PTA Distal            monophasic      +-----------+--------+-----+--------+----------+--------+  PERO Prox            monophasic       +-----------+--------+-----+--------+----------+--------+  PERO Mid             monophasic      +-----------+--------+-----+--------+----------+--------+  PERO Distal           monophasic      +-----------+--------+-----+--------+----------+--------+  DP                monophasic      +-----------+--------+-----+--------+----------+--------+     Left Graft #1:  +--------------------+--------+--------+----------+--------+            PSV cm/sStenosisWaveform Comments  +--------------------+--------+--------+----------+--------+  Inflow                          +--------------------+--------+--------+----------+--------+  Proximal Anastomosis                   +--------------------+--------+--------+----------+--------+  Proximal Graft           monophasic      +--------------------+--------+--------+----------+--------+  Mid Graft              monophasic      +--------------------+--------+--------+----------+--------+  Distal Graft            monophasic      +--------------------+--------+--------+----------+--------+  Distal Anastomosis                    +--------------------+--------+--------+----------+--------+  Outflow                          +--------------------+--------+--------+----------+--------+   Summary:  Left: Post below the knee tourniquet removal, arterial flow is detected in  the calf arteries.  Incidental findings of monophasic flow throughout arterial system  including femoral to popliteal bypass graft.     ASSESSMENT/PLAN: This is a 69 y.o. female who came to hospital with bleeding varicosity with hx of PAD and left femoral to above knee popliteal bypass by Dr. Donnetta Hutching ~ 15-20 years ago now with rest pain  bilaterally with left>right  PAD with rest pain -will admit to hospital for arteriogram tomorrow with BLE runoff by Dr. Carlis Abbott. Her arterial duplex today reveals bypass graft is patent, but with monophasic flow throughout on the left. She had faint doppler signals present bilateral DP.  We will obtain vein mapping in case she  needs possible bypass -Will hold on anticoagulation given bleeding varicosity.  -npo after MN/consent -discussed with pt the importance of smoking cessation  -hx of CAD - followed by [facility]  Bleeding varicosity left lower leg -continue with mild compression.  If she does develop further bleeding from this, elevate leg as tolerated and use one finger compression to control.  I also discussed this with pt in case it happens again.  -pt had tourniquet on her leg for reported 40 minutes.  Doubt she is at risk for compartment syndrome, but will monitor for this.   Leontine Locket, PA-C [facility] [phone]  I have independently interviewed and examined patient and agree with PA assessment and plan above.   Maxson Oddo C. Donzetta Matters, MD [facility] [place] Office: [phone] Pager: [phone]

## 2020-03-09 ENCOUNTER — Encounter (HOSPITAL_COMMUNITY)
Admission: EM | Disposition: A | Discharge status: Home / Self Care | Payer: Self-pay | Source: Home / Self Care | Attending: Emergency Medicine

## 2020-03-09 ENCOUNTER — Encounter (HOSPITAL_COMMUNITY): Payer: Self-pay | Admitting: Vascular Surgery

## 2020-03-09 ENCOUNTER — Encounter (HOSPITAL_COMMUNITY): Payer: Medicare Other

## 2020-03-09 DIAGNOSIS — I70223 Atherosclerosis of native arteries of extremities with rest pain, bilateral legs: Secondary | ICD-10-CM | POA: Diagnosis not present

## 2020-03-09 HISTORY — PX: ABDOMINAL AORTOGRAM W/LOWER EXTREMITY: CATH118223

## 2020-03-09 HISTORY — PX: PERIPHERAL VASCULAR INTERVENTION: CATH118257

## 2020-03-09 LAB — BASIC METABOLIC PANEL
Anion gap: 8 (ref 5–15)
BUN: 19 mg/dL (ref 8–23)
CO2: 27 mmol/L (ref 22–32)
Calcium: 8.9 mg/dL (ref 8.9–10.3)
Chloride: 106 mmol/L (ref 98–111)
Creatinine, Ser: 0.85 mg/dL (ref 0.44–1.00)
GFR calc Af Amer: >60 mL/min (ref >60)
GFR calc non Af Amer: >60 mL/min (ref >60)
Glucose, Bld: 122 mg/dL — ABNORMAL HIGH (ref 70–99)
Potassium: 4.1 mmol/L (ref 3.5–5.1)
Sodium: 141 mmol/L (ref 135–145)

## 2020-03-09 LAB — CBC
HCT: 27.9 % — ABNORMAL LOW (ref 36.0–46.0)
Hemoglobin: 8.9 g/dL — ABNORMAL LOW (ref 12.0–15.0)
MCH: 29.1 pg (ref 26.0–34.0)
MCHC: 31.9 g/dL (ref 30.0–36.0)
MCV: 91.2 fL (ref 80.0–100.0)
Platelets: 203 10*3/uL (ref 150–400)
RBC: 3.06 MIL/uL — ABNORMAL LOW (ref 3.87–5.11)
RDW: 22.7 % — ABNORMAL HIGH (ref 11.5–15.5)
WBC: 5.9 10*3/uL (ref 4.0–10.5)
nRBC: 0 % (ref 0.0–0.2)

## 2020-03-09 LAB — PROTIME-INR
INR: 1.1 (ref 0.8–1.2)
Prothrombin Time: 13.8 seconds (ref 11.4–15.2)

## 2020-03-09 LAB — HIV ANTIBODY (ROUTINE TESTING W REFLEX): HIV Screen 4th Generation wRfx: NONREACTIVE

## 2020-03-09 LAB — POCT ACTIVATED CLOTTING TIME: Activated Clotting Time: 175 seconds

## 2020-03-09 SURGERY — ABDOMINAL AORTOGRAM W/LOWER EXTREMITY
Anesthesia: LOCAL

## 2020-03-09 MED ORDER — IODIXANOL 320 MG/ML IV SOLN
INTRAVENOUS | Status: DC | PRN
  Administered 2020-03-09: 150 mL

## 2020-03-09 MED ORDER — CLOPIDOGREL BISULFATE 300 MG PO TABS
ORAL_TABLET | ORAL | Status: AC
  Filled 2020-03-09: qty 1

## 2020-03-09 MED ORDER — ONDANSETRON HCL 4 MG/2ML IJ SOLN: 4.0000 mg | Freq: Four times a day (QID) | INTRAMUSCULAR | Status: DC | PRN

## 2020-03-09 MED ORDER — SODIUM CHLORIDE 0.9 % IV SOLN: INTRAVENOUS | Status: DC

## 2020-03-09 MED ORDER — ALBUTEROL SULFATE (2.5 MG/3ML) 0.083% IN NEBU: 2.5000 mg | INHALATION_SOLUTION | Freq: Four times a day (QID) | RESPIRATORY_TRACT | Status: DC | PRN

## 2020-03-09 MED ORDER — HEPARIN SODIUM (PORCINE) 1000 UNIT/ML IJ SOLN
INTRAMUSCULAR | Status: AC
  Filled 2020-03-09: qty 1

## 2020-03-09 MED ORDER — MIDAZOLAM HCL 2 MG/2ML IJ SOLN
INTRAMUSCULAR | Status: DC | PRN
  Administered 2020-03-09: 1 mg via INTRAVENOUS

## 2020-03-09 MED ORDER — ALBUTEROL SULFATE HFA 108 (90 BASE) MCG/ACT IN AERS: 2.0000 | INHALATION_SPRAY | RESPIRATORY_TRACT | Status: DC

## 2020-03-09 MED ORDER — SENNOSIDES-DOCUSATE SODIUM 8.6-50 MG PO TABS
2.0000 | ORAL_TABLET | Freq: Every day | ORAL | Status: DC
  Administered 2020-03-09: 2 via ORAL
  Filled 2020-03-09: qty 2

## 2020-03-09 MED ORDER — HEPARIN (PORCINE) IN NACL 1000-0.9 UT/500ML-% IV SOLN
INTRAVENOUS | Status: DC | PRN
  Administered 2020-03-09 (×2): 500 mL

## 2020-03-09 MED ORDER — LIDOCAINE HCL (PF) 1 % IJ SOLN
INTRAMUSCULAR | Status: DC | PRN
  Administered 2020-03-09: 20 mL via INTRADERMAL

## 2020-03-09 MED ORDER — SODIUM CHLORIDE 0.9% FLUSH: 3.0000 mL | INTRAVENOUS | Status: DC | PRN

## 2020-03-09 MED ORDER — MIDAZOLAM HCL 2 MG/2ML IJ SOLN
INTRAMUSCULAR | Status: AC
  Filled 2020-03-09: qty 2

## 2020-03-09 MED ORDER — HEPARIN (PORCINE) IN NACL 1000-0.9 UT/500ML-% IV SOLN
INTRAVENOUS | Status: AC
  Filled 2020-03-09: qty 1000

## 2020-03-09 MED ORDER — VERAPAMIL HCL ER 240 MG PO TBCR: 240.0000 mg | EXTENDED_RELEASE_TABLET | Freq: Every evening | ORAL | Status: DC

## 2020-03-09 MED ORDER — FENTANYL CITRATE (PF) 100 MCG/2ML IJ SOLN
INTRAMUSCULAR | Status: AC
  Filled 2020-03-09: qty 2

## 2020-03-09 MED ORDER — HYDRALAZINE HCL 20 MG/ML IJ SOLN: 5.0000 mg | INTRAMUSCULAR | Status: DC | PRN

## 2020-03-09 MED ORDER — HEPARIN SODIUM (PORCINE) 1000 UNIT/ML IJ SOLN
INTRAMUSCULAR | Status: DC | PRN
  Administered 2020-03-09: 6000 [IU] via INTRAVENOUS

## 2020-03-09 MED ORDER — ACETAMINOPHEN 325 MG PO TABS
650.0000 mg | ORAL_TABLET | ORAL | Status: DC | PRN
  Administered 2020-03-09 (×2): 650 mg via ORAL
  Filled 2020-03-09 (×2): qty 2

## 2020-03-09 MED ORDER — SODIUM CHLORIDE 0.9 % IV SOLN: 250.0000 mL | INTRAVENOUS | Status: DC | PRN

## 2020-03-09 MED ORDER — CLOPIDOGREL BISULFATE 75 MG PO TABS
75.0000 mg | ORAL_TABLET | Freq: Every day | ORAL | Status: DC
  Administered 2020-03-10: 75 mg via ORAL
  Filled 2020-03-09: qty 1

## 2020-03-09 MED ORDER — LABETALOL HCL 5 MG/ML IV SOLN: 10.0000 mg | INTRAVENOUS | Status: DC | PRN

## 2020-03-09 MED ORDER — POTASSIUM CHLORIDE CRYS ER 20 MEQ PO TBCR
20.0000 meq | EXTENDED_RELEASE_TABLET | Freq: Every morning | ORAL | Status: DC
  Administered 2020-03-10: 20 meq via ORAL
  Filled 2020-03-09: qty 1

## 2020-03-09 MED ORDER — SPIRONOLACTONE 25 MG PO TABS
25.0000 mg | ORAL_TABLET | Freq: Every morning | ORAL | Status: DC
  Administered 2020-03-10: 25 mg via ORAL
  Filled 2020-03-09: qty 1

## 2020-03-09 MED ORDER — SODIUM CHLORIDE 0.9 % IV SOLN: INTRAVENOUS | Status: AC

## 2020-03-09 MED ORDER — FUROSEMIDE 40 MG PO TABS
40.0000 mg | ORAL_TABLET | Freq: Every morning | ORAL | Status: DC
  Administered 2020-03-10: 40 mg via ORAL
  Filled 2020-03-09: qty 1

## 2020-03-09 MED ORDER — CLOPIDOGREL BISULFATE 75 MG PO TABS: 300.0000 mg | ORAL_TABLET | Freq: Once | ORAL | Status: DC

## 2020-03-09 MED ORDER — LACTULOSE 10 GM/15ML PO SOLN: 20.0000 g | ORAL | Status: DC | PRN

## 2020-03-09 MED ORDER — ASPIRIN EC 81 MG PO TBEC
81.0000 mg | DELAYED_RELEASE_TABLET | Freq: Every day | ORAL | Status: DC
  Administered 2020-03-10: 81 mg via ORAL
  Filled 2020-03-09: qty 1

## 2020-03-09 MED ORDER — SODIUM CHLORIDE 0.9% FLUSH
3.0000 mL | Freq: Two times a day (BID) | INTRAVENOUS | Status: DC
  Administered 2020-03-09: 3 mL via INTRAVENOUS

## 2020-03-09 MED ORDER — CLOPIDOGREL BISULFATE 300 MG PO TABS
ORAL_TABLET | ORAL | Status: DC | PRN
  Administered 2020-03-09: 300 mg via ORAL

## 2020-03-09 MED ORDER — FENTANYL CITRATE (PF) 100 MCG/2ML IJ SOLN
INTRAMUSCULAR | Status: DC | PRN
  Administered 2020-03-09: 50 ug via INTRAVENOUS

## 2020-03-09 MED ORDER — LIDOCAINE HCL (PF) 1 % IJ SOLN
INTRAMUSCULAR | Status: AC
  Filled 2020-03-09: qty 30

## 2020-03-09 MED ORDER — CLOPIDOGREL BISULFATE 75 MG PO TABS: 75.0000 mg | ORAL_TABLET | Freq: Every day | ORAL | Status: DC

## 2020-03-09 MED ORDER — EZETIMIBE 10 MG PO TABS
10.0000 mg | ORAL_TABLET | Freq: Every day | ORAL | Status: DC
  Administered 2020-03-09: 10 mg via ORAL
  Filled 2020-03-09: qty 1

## 2020-03-09 SURGICAL SUPPLY — 23 items
BALLN MUSTANG 5X60X75 (BALLOONS) ×3
BALLOON MUSTANG 5X60X75 (BALLOONS) ×2 IMPLANT
CATH BEACON 5 .035 65 KMP TIP (CATHETERS) ×3 IMPLANT
CATH CROSS OVER TEMPO 5F (CATHETERS) ×3 IMPLANT
CATH OMNI FLUSH 5F 65CM (CATHETERS) ×3 IMPLANT
CATH SOFT-VU 4F 65 STRAIGHT (CATHETERS) ×2 IMPLANT
CATH SOFT-VU STRAIGHT 4F 65CM (CATHETERS) ×3
DEVICE CLOSURE MYNXGRIP 5F (Vascular Products) ×3 IMPLANT
DEVICE TORQUE .025-.038 (MISCELLANEOUS) ×3 IMPLANT
KIT ENCORE 26 ADVANTAGE (KITS) ×3 IMPLANT
KIT MICROPUNCTURE NIT STIFF (SHEATH) ×3 IMPLANT
KIT PV (KITS) ×3 IMPLANT
SHEATH BRITE TIP 7FR 35CM (SHEATH) ×3 IMPLANT
SHEATH PINNACLE 5F 10CM (SHEATH) ×3 IMPLANT
SHEATH PINNACLE 7F 10CM (SHEATH) ×3 IMPLANT
SHEATH PROBE COVER 6X72 (BAG) ×3 IMPLANT
STENT ELUVIA 6X60X130 (Permanent Stent) ×3 IMPLANT
STENT VIABAHN 7X29X80 VBX (Permanent Stent) ×3 IMPLANT
SYR MEDRAD MARK V 150ML (SYRINGE) ×3 IMPLANT
TRANSDUCER W/STOPCOCK (MISCELLANEOUS) ×3 IMPLANT
TRAY PV CATH (CUSTOM PROCEDURE TRAY) ×3 IMPLANT
WIRE ROSEN-J .035X260CM (WIRE) ×3 IMPLANT
WIRE STARTER BENTSON 035X150 (WIRE) ×3 IMPLANT

## 2020-03-09 NOTE — Progress Notes (Signed)
Pt transferred from ED to 4E27, appeared alert and oriented x 4, independently ambulated to bathroom, tolerated well. CHG bath given, call bell within reach, room and equipments oriented. Left food pressure dressing appeared dry and clean. Complained generalized pain, mostly on her lower back and both legs, pain scale 8-9/10. Oxycodone and Morphine given PRN.   She 's homodontically stable, NSR on monitor, HR 70s -80s, BP within normal limits, SPO2 100 % on room air, no immediate distress noted. Doppler detected good pulses on PT, DP and popliteal pulse bilaterally. Except left DP, we were unable to assess due to original pressure dressing in place. Left lower 2+ pitting edema and cool touched.    Plan Arteriogram and possible intervention scheduled at 1 pm on 03/09/20 by Dr. Carlis Abbott. Informed consent and blood consent signed. Pt understood for pre-op preparation, stated sometimes she can be forgetful and she needed to talk with Dr. Carlis Abbott about risks and complications before procedure tomorrow.   Dr. Donzetta Matters notified for Lab COVID19 screening order and Pt also requested her Albuterol inhaler. No acute distress. We continue to monitor.  Kennyth Lose, RN

## 2020-03-09 NOTE — Progress Notes (Signed)
  1420- evaluation of groins noted left groin raised/ firm/ painful.  Patient c/o increased back pain.   Dr. Carlis Abbott at bedside for evaluation.   Continued to hold pressure x 20 mins and monitor bilateral sites.

## 2020-03-09 NOTE — Progress Notes (Signed)
I have ordered lower extremity vein mapping to look for conduit for right lower extremity fem below-knee pop bypass.  I also ordered echocardiogram given reported history of coronary artery disease and CHF to ensure safe for general anesthetic.  I have updated family.  We will schedule with me next week at patient's request.  [name], MD [facility] Office: [place]

## 2020-03-09 NOTE — Progress Notes (Signed)
Site area: right groin  Site Prior to Removal:  Level 0  Pressure Applied For 20 MINUTES    Minutes Beginning at 1250  Manual:   Yes.    Patient Status During Pull:  Stable  Post Pull Groin Site:  Level 0  Post Pull Instructions Given:  Yes.    Post Pull Pulses Present:  Yes.    Dressing Applied:  Yes.    Comments:  Bed rest started at 1315 X 4 hr.

## 2020-03-09 NOTE — Progress Notes (Signed)
[facility] [place]  Subjective  -having more pain in her left leg where she is previously had a bypass.   Objective (!) 145/71 78 98 F (36.7 C) (Oral) 17 95%  Intake/Output Summary (Last 24 hours) at 03/09/2020 1027 Last data filed at 03/09/2020 0500 Gross per 24 hour  Intake 500 ml  Output 5 ml  Net 495 ml    Right femoral pulse palpable, left femoral pulse weak  Laboratory Lab Results: Recent Labs    03/08/20 0920 03/09/20 0231  WBC 7.1 5.9  HGB 11.0* 8.9*  HCT 34.4* 27.9*  PLT 266 203   BMET Recent Labs    03/08/20 1300 03/09/20 0231  NA 138 141  K 3.5 4.1  CL 106 106  CO2 20* 27  GLUCOSE 113* 122*  BUN 16 19  CREATININE 0.87 0.85  CALCIUM 8.7* 8.9    COAG Lab Results  Component Value Date   INR 1.1 03/09/2020   INR 1.1 03/08/2020   No results found for: PTT  Assessment/Planning:  Plan aortogram lower extremity arteriogram and possible intervention.  Patient's had a previous left femoropopliteal bypass by Dr. Donnetta Hutching 15 to 20 years ago.  Her left leg is more symptomatic.  Risk benefits discussed.  [name] 03/09/2020 10:27 AM --

## 2020-03-09 NOTE — Op Note (Signed)
Patient name: Marie Johnson MRN: [id number] DOB: 04-10-1951 Sex: female  03/09/2020 Pre-operative Diagnosis: Critical limb ischemia of the bilateral lower extremities with rest pain Post-operative diagnosis:  Same Surgeon:  [name], MD Procedure Performed: 1.  Ultrasound-guided access of the right common femoral artery 2.  Aortogram including catheter selection of the aorta 3.  Bilateral lower extremity arteriogram with runoff 4.  Ultrasound-guided access of the left common femoral artery 5.  Left common iliac artery angioplasty with stent placement (7 mm x 29 mm VBX) 6.  Left external iliac artery angioplasty with stent placement (6 mm x 60 mm drug-coated Eluvia postdilated with a 5 mm Mustang) 7.  Mynx closure of the right common femoral artery 8.  68 minutes of monitored moderate conscious sedation time   Indications: Patient is a 69 year old female who previously had a left femoral to above-knee popliteal bypass with Dr. Donnetta Hutching 15 to 20 years ago.  She presented to the ED yesterday with a bleeding varicosity and during evaluation was noted to have rest pain in the bilateral lower extremities.  Flow down the left lower extremity was monophasic from the common femoral all the way down through the bypass and fairly sluggish.  She presents today for planned left leg intervention given she has more symptomatic left leg symptoms but will also evaluate right leg runoff.  Risk and benefits have been discussed.  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  Her bilateral renal arteries were patent although she has apparent high grade stenosis on the left renal artery.  Her distal aorta was fairly small and on the left appeared to have a napkin ring stenosis with calcification in the proximal common iliac artery.  We performed a pullback pressure here and the pressure gradient was 20 mmHg suggesting hemodynamic significant lesion.  The left common iliac  artery was then stented with a 7 mm x 29 mm VBX proximally.  The left external iliac artery was diffusely diseased and in the mid to distal segment there was approximate 50% stenosis that was stented with a 6 mm x 60 mm drug-coated Eluvia and postdilated with a 5 mm balloon.    Runoff down the lower left lower extremity shows a patent common femoral to above-knee popliteal vein bypass with single-vessel runoff via peroneal artery that is widely patent.  Right lower extremity shows a patent common femoral and profunda with a flush SFA occlusion and she reconstitutes a decent target in the below-knee popliteal artery with three-vessel runoff proximally but the posterior tibial occludes in the mid calf.   Procedure:  The patient was identified in the holding area and taken to room 8.  The patient was then placed supine on the table and prepped and draped in the usual sterile fashion.  A time out was called.  Ultrasound was used to evaluate the right common femoral artery.  It was patent .  A digital ultrasound image was acquired.  A micropuncture needle was used to access the right common femoral artery under ultrasound guidance.  An 018 wire was advanced without resistance and a micropuncture sheath was placed.  The 018 wire was removed and a benson wire was placed.  The micropuncture sheath was exchanged for a 5 french sheath.  An omniflush catheter was advanced over the wire to the level of L-1.  An abdominal angiogram was obtained as well as bilateral lower extremity arteriogram.  Given monophasic flow throughout the left lower extremity  we spent a lot of time evaluating her left iliacs to try and determine where the flow limitation was.  She did appear to have a proximal left common iliac napkin ring focal stenosis after we got lateral oblique images.  I then crossed the aortic bifurcation down the left iliac and performed a pullback pressure in the iliac artery and there was a 20 mm pressure difference  suggesting hemodynamic flow difference of this iliac lesion in the left.  I then used ultrasound to evaluate the left common femoral artery it was patent and image was saved.  Left common femoral artery was accessed under ultrasound a microwire was placed with a micro sheath and then I was able to advance a straight wire across her left iliac stenosis with support of a angled catheter.  We then got some planning shots and placed a 7 French sheath in the left groin.  Patient was given 100 units per kilogram heparin.  Then selected 7 mm x 29 mm VBX that was deployed in the left common iliac artery proximally.  I did not feel the need to place kissing stents as I landed this just in the common iliac and avoided jailing the contralateral iliac.  When I then shot a sheath shot and the left common iliac artery stent was fully deployed with no extravasation.  It then became apparent that the external iliac disease was also likely an issue for her bypass as well.  I pulled the sheath back and then got another planning shot and we placed a 6 mm x 60 mm drug-coated Eluvia in the mid to distal left external iliac artery and postdilated with a 5 mm balloon with no residual stenosis.  She had a good femoral pulse upon completion.  That point time a wires and catheters were removed.  A mynx closure device was deployed in the right common femoral artery.  Given the bypass in the left groin I think she should go to holding to have her left sheath removed with manual pressure.  Plan: Patient will be loaded on Plavix as well as aspirin for her left iliac stents.  We will obtain vein mapping.  We will likely plan for right lower extremity femoropopliteal bypass in the near future.   [name], MD [facility] Office: [phone]

## 2020-03-09 NOTE — Progress Notes (Deleted)
12:20 - evaluation of groins noted left groin raised/firm/painful. Patient c/o increased back pain.  Applied pressure.  Notified Charge.  Notified cath lab. Dr. Carlis Abbott at bedside for evaluation.   Continued pressure x 20 mins and monitor sites.

## 2020-03-10 ENCOUNTER — Observation Stay (HOSPITAL_BASED_OUTPATIENT_CLINIC_OR_DEPARTMENT_OTHER): Payer: Medicare Other

## 2020-03-10 DIAGNOSIS — I2510 Atherosclerotic heart disease of native coronary artery without angina pectoris: Secondary | ICD-10-CM | POA: Diagnosis not present

## 2020-03-10 LAB — ECHOCARDIOGRAM COMPLETE
Height: 68 in
Weight: 2080 oz

## 2020-03-10 MED ORDER — CLOPIDOGREL BISULFATE 75 MG PO TABS: 75.0000 mg | ORAL_TABLET | Freq: Every day | ORAL | 1 refills | Status: DC

## 2020-03-10 MED ORDER — OXYCODONE HCL 5 MG PO TABS
10.0000 mg | ORAL_TABLET | ORAL | Status: DC | PRN
  Administered 2020-03-10 (×3): 10 mg via ORAL
  Filled 2020-03-10 (×3): qty 2

## 2020-03-10 MED ORDER — ASPIRIN 81 MG PO TBEC: 81.0000 mg | DELAYED_RELEASE_TABLET | Freq: Every day | ORAL | 11 refills | Status: DC

## 2020-03-10 MED FILL — CLOPIDOGREL 75 MG TABLET: 75 | 30 days supply | Qty: 30 | Fill #0

## 2020-03-10 NOTE — Discharge Instructions (Signed)
° °  [facility] ° °Discharge Instructions ° °Lower Extremity Angiogram; Angioplasty/Stenting ° °Please refer to the following instructions for your post-procedure care. Your surgeon or physician assistant will discuss any changes with you. ° °Activity ° °Avoid lifting more than 8 pounds (1 gallons of milk) for 72 hours (3 days) after your procedure. You may walk as much as you can tolerate. It's OK to drive after 72 hours. ° °Bathing/Showering ° °You may shower the day after your procedure. If you have a bandage, you may remove it at 24- 48 hours. Clean your incision site with mild soap and water. Pat the area dry with a clean towel. ° °Diet ° °Resume your pre-procedure diet. There are no special food restrictions following this procedure. All patients with peripheral vascular disease should follow a low fat/low cholesterol diet. In order to heal from your surgery, it is CRITICAL to get adequate nutrition. Your body requires vitamins, minerals, and protein. Vegetables are the best source of vitamins and minerals. Vegetables also provide the perfect balance of protein. Processed food has little nutritional value, so try to avoid this. ° °Medications ° °Resume taking all of your medications unless your doctor tells you not to. If your incision is causing pain, you may take over-the-counter pain relievers such as acetaminophen (Tylenol) ° °Follow Up ° °Follow up will be arranged at the time of your procedure. You may have an office visit scheduled or may be scheduled for surgery. Ask your surgeon if you have any questions. ° °Please call us immediately for any of the following conditions: °•Severe or worsening pain your legs or feet at rest or with walking. °•Increased pain, redness, drainage at your groin puncture site. °•Fever of 101 degrees or higher. °•If you have any mild or slow bleeding from your puncture site: lie down, apply firm constant pressure over the area with a piece of  gauze or a clean wash cloth for 30 minutes- no peeking!, call 911 right away if you are still bleeding after 30 minutes, or if the bleeding is heavy and unmanageable. ° °Reduce your risk factors of vascular disease: ° °Stop smoking. If you would like help call [facility] at 1-800-QUIT-NOW (1-800-784-8669) or [place] at 336-586-4000. °Manage your cholesterol °Maintain a desired weight °Control your diabetes °Keep your blood pressure down ° °If you have any questions, please call the office at [phone] ° °

## 2020-03-10 NOTE — Progress Notes (Addendum)
  Progress Note    03/10/2020 7:29 AM 1 Day Post-Op  Subjective:  L leg feels better compared to pre-op   Vitals:   03/10/20 0500 03/10/20 0600  BP: (!) 144/71 (!) 152/72  Pulse: 65 76  Resp: 15 17  Temp:    SpO2: 98% 96%   Physical Exam: Lungs:  Non labored Incisions:  Groin cath sites soft without hematoma; ecchymosis L groin Extremities:  L AT and peroneal by doppler; R AT/PT by doppler Neurologic: A&O  CBC    Component Value Date/Time   WBC 5.9 03/09/2020 0231   RBC 3.06 (L) 03/09/2020 0231   HGB 8.9 (L) 03/09/2020 0231   HCT 27.9 (L) 03/09/2020 0231   PLT 203 03/09/2020 0231   MCV 91.2 03/09/2020 0231   MCH 29.1 03/09/2020 0231   MCHC 31.9 03/09/2020 0231   RDW 22.7 (H) 03/09/2020 0231    BMET    Component Value Date/Time   NA 141 03/09/2020 0231   K 4.1 03/09/2020 0231   CL 106 03/09/2020 0231   CO2 27 03/09/2020 0231   GLUCOSE 122 (H) 03/09/2020 0231   BUN 19 03/09/2020 0231   CREATININE 0.85 03/09/2020 0231   CALCIUM 8.9 03/09/2020 0231   GFRNONAA >60 03/09/2020 0231   GFRAA >60 03/09/2020 0231    INR    Component Value Date/Time   INR 1.1 03/09/2020 0231     Intake/Output Summary (Last 24 hours) at 03/10/2020 0729 Last data filed at 03/10/2020 3295 Gross per 24 hour  Intake 1795.83 ml  Output 3100 ml  Net -1304.17 ml     Assessment/Plan:  69 y.o. female is s/p Aortogram with L CIA/EIA stenting 1 Day Post-Op   LLE rest pain resolved per patient Groin cath sites are unremarkable this morning Check echo this morning Check vein mapping this morning Ok for discharge home after vein mapping and echo; plavix prescription provided   Dagoberto Ligas, PA-C [facility] [phone] 03/10/2020 7:29 AM  I have independently interviewed and examined patient and agree with PA assessment and plan above.   Nakira Litzau C. Donzetta Matters, MD [facility] Office: [phone] Pager: [phone]

## 2020-03-10 NOTE — Progress Notes (Addendum)
Notified Dr. Donnetta Hutching, Pt requested medication for restless leg syndrome and chronic leg and back pain scale -9/10. Pt stated she usually takes 10 mg of Oxycodone q 4 at home.   New order received for 10 mg of Oxycodone q 4 hr PRN.   Right groin dry and clean,  After the incident of bleeding from the left lower abdominal,on left groin incision per RN day shift reported, pressure dressing applied, it's dry, intact and clean, no bleeding or hematoma appeared tonight.  She's hemodynamically stable, HR 70s-80s, NSR on monitor, BP 150/75 - 163/ 79 mmHg, SPO2 93-97 % on room air, RR 15-18, Temp 98.3-99.0 F, No immediate distress noted tonight. We will continue to monitor.  Sonda Primes, RN

## 2020-03-10 NOTE — TOC CAGE-AID Note (Signed)
Transition of Care [facility]) - CAGE-AID Screening   Patient Details  Name: KORRA CHRISTINE MRN: [id number] Date of Birth: Mar 25, 1951  [facility]) CM/SW Contact:    Emeterio Reeve, [place] Phone Number: 03/10/2020, 11:30 AM   Clinical Narrative:  Pt denied alcohol use and substance use. Pt stated that she has a grand daughter who uses substances and expressed her displeasure with her life choices and the effects on the family. Pt requested resources to share with her in hopes that she will use them. CSW gave pt resources.   CAGE-AID Screening:    Have You Ever Felt You Ought to Cut Down on Your Drinking or Drug Use?: No Have People Annoyed You By Critizing Your Drinking Or Drug Use?: No Have You Felt Bad Or Guilty About Your Drinking Or Drug Use?: No Have You Ever Had a Drink or Used Drugs First Thing In The Morning to STeady Your Nerves or to Get Rid of a Hangover?: No CAGE-AID Score: 0  Substance Abuse Education Offered: Yes  Substance abuse interventions: Patient Counseling  Emeterio Reeve, Latanya Presser, [place] Social Worker [phone]

## 2020-03-10 NOTE — Progress Notes (Signed)
  Echocardiogram 2D Echocardiogram has been performed.  Marie Johnson 03/10/2020, 9:05 AM

## 2020-03-11 ENCOUNTER — Encounter: Payer: Self-pay | Admitting: Physician Assistant

## 2020-03-14 NOTE — Discharge Summary (Signed)
Discharge Summary  Patient ID: Marie Johnson [id number] 69 y.o. [birth year]   Admit date: 03/08/2020  Discharge date and time: 03/10/2020  2:08 PM   Admitting Physician: Waynetta Sandy, MD   Discharge Physician: same  Admission Diagnoses: Bleeding from varicose vein [I83.899] Peripheral artery disease ([place]) [I73.9] Critical limb ischemia with history of revascularization of same extremity [I99.8, Z95.9]  Discharge Diagnoses: same  Admission Condition: fair  Discharged Condition: fair  Indication for Admission: bleeding varicose vein; rest pain RLE  Hospital Course: Ms. Marie Johnson is a 69 year old female who presented to the emergency department due to bleeding from a varicose vein.  Bleeding was stopped in the emergency department however patient complained of significant rest pain especially when elevating her leg.  She underwent aortogram with left common and external iliac artery angioplasty and stenting as well as diagnostic right lower extremity arteriogram.  She tolerated this procedure well and was admitted postoperatively.  Left lower extremity rest pain resolved with iliac stenting however she will require right lower extremity bypass graft.  This is scheduled for 03/18/2020 with Dr. Carlis Abbott.  She was discharged in stable condition.  Consults: None  Treatments: surgery: Right lower extremity arteriogram; left common and external iliac artery angioplasty and stent placement by Dr. Carlis Abbott on 03/09/2020  Discharge Exam: See progress note 03/10/2020 Vitals:   03/10/20 0910 03/10/20 1148  BP: 128/68 (!) 167/90  Pulse: 86 83  Resp: 17   Temp: 98.6 F (37 C) 98 F (36.7 C)  SpO2: 98% 100%     Disposition: Discharge disposition: 01-Home or Self Care       Patient Instructions:  Allergies as of 03/10/2020      Reactions   Bee Venom Anaphylaxis   Bee Venom Anaphylaxis, Other (See Comments)   Welts, also   Insect Extract Allergy Skin Test Anaphylaxis, Other  (See Comments)   Insect bites- Welts, also   Nsaids Nausea Only, Other (See Comments)   Patient is not suppose to have this class of medication (liver)   Latex Rash   Latex Rash      Medication List    STOP taking these medications   GOODY HEADACHE PO     TAKE these medications   albuterol (2.5 MG/3ML) 0.083% nebulizer solution Commonly known as: PROVENTIL Take 2.5 mg by nebulization every 6 (six) hours as needed for wheezing or shortness of breath.   albuterol 108 (90 Base) MCG/ACT inhaler Commonly known as: VENTOLIN HFA Inhale 2 puffs into the lungs See admin instructions. Inhale 2 puffs into the lungs every four to six hours as needed for wheezing or shortness of breath   alendronate 70 MG tablet Commonly known as: FOSAMAX Take 70 mg by mouth every Tuesday.   allopurinol 300 MG tablet Commonly known as: ZYLOPRIM Take 300 mg by mouth at bedtime.   aspirin 81 MG EC tablet Take 1 tablet (81 mg total) by mouth daily. Swallow whole.   atorvastatin 80 MG tablet Commonly known as: LIPITOR Take 80 mg by mouth at bedtime.   Benadryl Allergy 25 MG tablet Generic drug: diphenhydrAMINE Take 50 mg by mouth at bedtime.   clopidogrel 75 MG tablet Commonly known as: PLAVIX Take 1 tablet (75 mg total) by mouth daily.   EpiPen 2-Pak 0.3 mg/0.3 mL Soaj injection Generic drug: EPINEPHrine Inject 0.3 mg into the muscle as needed for anaphylaxis.   ezetimibe 10 MG tablet Commonly known as: ZETIA Take 10 mg by mouth at bedtime.   furosemide 40  MG tablet Commonly known as: LASIX Take 40 mg by mouth in the morning.   Klor-Con M20 20 MEQ tablet Generic drug: potassium chloride SA Take 20 mEq by mouth in the morning.   lactulose 10 GM/15ML solution Commonly known as: CHRONULAC Take 20 g by mouth as needed for moderate constipation.   Oxycodone HCl 10 MG Tabs Take 10 mg by mouth 4 (four) times daily as needed (for pain).   pantoprazole 40 MG tablet Commonly known as:  PROTONIX Take 40 mg by mouth See admin instructions. Take 40 mg by mouth in the morning before breakfast and an additional 40 mg once a day as needed for reflux   senna-docusate 8.6-50 MG tablet Commonly known as: Senokot-S Take 2 tablets by mouth at bedtime.   spironolactone 25 MG tablet Commonly known as: ALDACTONE Take 25 mg by mouth in the morning.   verapamil 240 MG CR tablet Commonly known as: CALAN-SR Take 240 mg by mouth every evening.      Activity: activity as tolerated Diet: regular diet Wound Care: none needed  Follow-up for RLE bypass with Dr. Carlis Abbott 03/18/20  Signed: Dagoberto Ligas, PA-C 03/14/2020 1:43 PM [facility] Office: [phone]

## 2020-03-15 ENCOUNTER — Other Ambulatory Visit: Payer: Self-pay

## 2020-03-15 ENCOUNTER — Encounter (HOSPITAL_COMMUNITY): Payer: Self-pay

## 2020-03-15 ENCOUNTER — Other Ambulatory Visit (HOSPITAL_COMMUNITY)
Admission: RE | Admit: 2020-03-15 | Discharge: 2020-03-15 | Disposition: A | Discharge status: Home / Self Care | Payer: Medicare Other | Source: Ambulatory Visit | Attending: Vascular Surgery | Admitting: Vascular Surgery

## 2020-03-15 ENCOUNTER — Encounter (HOSPITAL_COMMUNITY)
Admission: RE | Admit: 2020-03-15 | Discharge: 2020-03-15 | Disposition: A | Discharge status: Home / Self Care | Payer: Medicare Other | Source: Ambulatory Visit | Attending: Vascular Surgery | Admitting: Vascular Surgery

## 2020-03-15 DIAGNOSIS — G8918 Other acute postprocedural pain: Secondary | ICD-10-CM | POA: Diagnosis not present

## 2020-03-15 DIAGNOSIS — I5030 Unspecified diastolic (congestive) heart failure: Secondary | ICD-10-CM | POA: Insufficient documentation

## 2020-03-15 DIAGNOSIS — I493 Ventricular premature depolarization: Secondary | ICD-10-CM | POA: Insufficient documentation

## 2020-03-15 DIAGNOSIS — I83891 Varicose veins of right lower extremities with other complications: Secondary | ICD-10-CM | POA: Diagnosis not present

## 2020-03-15 DIAGNOSIS — I110 Hypertensive heart disease with heart failure: Secondary | ICD-10-CM | POA: Diagnosis not present

## 2020-03-15 DIAGNOSIS — Z95820 Peripheral vascular angioplasty status with implants and grafts: Secondary | ICD-10-CM | POA: Insufficient documentation

## 2020-03-15 DIAGNOSIS — I2510 Atherosclerotic heart disease of native coronary artery without angina pectoris: Secondary | ICD-10-CM | POA: Insufficient documentation

## 2020-03-15 DIAGNOSIS — Z91040 Latex allergy status: Secondary | ICD-10-CM | POA: Diagnosis not present

## 2020-03-15 DIAGNOSIS — Z96643 Presence of artificial hip joint, bilateral: Secondary | ICD-10-CM | POA: Diagnosis not present

## 2020-03-15 DIAGNOSIS — Z8249 Family history of ischemic heart disease and other diseases of the circulatory system: Secondary | ICD-10-CM | POA: Diagnosis not present

## 2020-03-15 DIAGNOSIS — Z79899 Other long term (current) drug therapy: Secondary | ICD-10-CM | POA: Insufficient documentation

## 2020-03-15 DIAGNOSIS — Z8711 Personal history of peptic ulcer disease: Secondary | ICD-10-CM | POA: Diagnosis not present

## 2020-03-15 DIAGNOSIS — Z7982 Long term (current) use of aspirin: Secondary | ICD-10-CM | POA: Diagnosis not present

## 2020-03-15 DIAGNOSIS — Z20822 Contact with and (suspected) exposure to covid-19: Secondary | ICD-10-CM | POA: Diagnosis not present

## 2020-03-15 DIAGNOSIS — K219 Gastro-esophageal reflux disease without esophagitis: Secondary | ICD-10-CM | POA: Diagnosis not present

## 2020-03-15 DIAGNOSIS — J449 Chronic obstructive pulmonary disease, unspecified: Secondary | ICD-10-CM | POA: Diagnosis not present

## 2020-03-15 DIAGNOSIS — M810 Age-related osteoporosis without current pathological fracture: Secondary | ICD-10-CM | POA: Diagnosis not present

## 2020-03-15 DIAGNOSIS — I5032 Chronic diastolic (congestive) heart failure: Secondary | ICD-10-CM | POA: Diagnosis not present

## 2020-03-15 DIAGNOSIS — I70221 Atherosclerosis of native arteries of extremities with rest pain, right leg: Secondary | ICD-10-CM | POA: Diagnosis not present

## 2020-03-15 DIAGNOSIS — U071 COVID-19: Secondary | ICD-10-CM | POA: Insufficient documentation

## 2020-03-15 DIAGNOSIS — D62 Acute posthemorrhagic anemia: Secondary | ICD-10-CM | POA: Diagnosis not present

## 2020-03-15 DIAGNOSIS — Z7983 Long term (current) use of bisphosphonates: Secondary | ICD-10-CM | POA: Diagnosis not present

## 2020-03-15 DIAGNOSIS — Z01818 Encounter for other preprocedural examination: Secondary | ICD-10-CM | POA: Insufficient documentation

## 2020-03-15 DIAGNOSIS — I708 Atherosclerosis of other arteries: Secondary | ICD-10-CM | POA: Diagnosis not present

## 2020-03-15 HISTORY — DX: Heart failure, unspecified: I50.9

## 2020-03-15 HISTORY — DX: Presence of spectacles and contact lenses: Z97.3

## 2020-03-15 HISTORY — DX: Presence of dental prosthetic device (complete) (partial): Z97.2

## 2020-03-15 HISTORY — DX: Pneumonia, unspecified organism: J18.9

## 2020-03-15 HISTORY — DX: Anemia, unspecified: D64.9

## 2020-03-15 HISTORY — DX: Prediabetes: R73.03

## 2020-03-15 LAB — URINALYSIS, ROUTINE W REFLEX MICROSCOPIC
Bacteria, UA: NONE SEEN
Bilirubin Urine: NEGATIVE
Glucose, UA: NEGATIVE mg/dL
Ketones, ur: NEGATIVE mg/dL
Leukocytes,Ua: NEGATIVE
Nitrite: NEGATIVE
Protein, ur: NEGATIVE mg/dL
Specific Gravity, Urine: 1.005 (ref 1.005–1.030)
pH: 6 (ref 5.0–8.0)

## 2020-03-15 LAB — COMPREHENSIVE METABOLIC PANEL
ALT: 17 U/L (ref 0–44)
AST: 20 U/L (ref 15–41)
Albumin: 3.8 g/dL (ref 3.5–5.0)
Alkaline Phosphatase: 70 U/L (ref 38–126)
Anion gap: 10 (ref 5–15)
BUN: 16 mg/dL (ref 8–23)
CO2: 26 mmol/L (ref 22–32)
Calcium: 8.8 mg/dL — ABNORMAL LOW (ref 8.9–10.3)
Chloride: 98 mmol/L (ref 98–111)
Creatinine, Ser: 0.87 mg/dL (ref 0.44–1.00)
GFR calc Af Amer: >60 mL/min (ref >60)
GFR calc non Af Amer: >60 mL/min (ref >60)
Glucose, Bld: 107 mg/dL — ABNORMAL HIGH (ref 70–99)
Potassium: 3.7 mmol/L (ref 3.5–5.1)
Sodium: 134 mmol/L — ABNORMAL LOW (ref 135–145)
Total Bilirubin: 0.7 mg/dL (ref 0.3–1.2)
Total Protein: 7.4 g/dL (ref 6.5–8.1)

## 2020-03-15 LAB — PROTIME-INR
INR: 1.1 (ref 0.8–1.2)
Prothrombin Time: 13.4 seconds (ref 11.4–15.2)

## 2020-03-15 LAB — CBC
HCT: 30.5 % — ABNORMAL LOW (ref 36.0–46.0)
Hemoglobin: 9.7 g/dL — ABNORMAL LOW (ref 12.0–15.0)
MCH: 29.1 pg (ref 26.0–34.0)
MCHC: 31.8 g/dL (ref 30.0–36.0)
MCV: 91.6 fL (ref 80.0–100.0)
Platelets: 343 10*3/uL (ref 150–400)
RBC: 3.33 MIL/uL — ABNORMAL LOW (ref 3.87–5.11)
RDW: 23.1 % — ABNORMAL HIGH (ref 11.5–15.5)
WBC: 7.2 10*3/uL (ref 4.0–10.5)
nRBC: 0.4 % — ABNORMAL HIGH (ref 0.0–0.2)

## 2020-03-15 LAB — APTT: aPTT: 32 seconds (ref 24–36)

## 2020-03-15 LAB — ABO/RH: ABO/RH(D): A POS

## 2020-03-15 LAB — SARS CORONAVIRUS 2 (TAT 6-24 HRS): SARS Coronavirus 2: NEGATIVE

## 2020-03-15 NOTE — Pre-Procedure Instructions (Addendum)
    Marie Johnson  [birth year]     Your procedure is scheduled on Friday, March 18, 2020.  Report to [facility] Admitting at 5:30 A.M.  Call this number if you have problems the morning of surgery:  [phone]   Remember: Brush your teeth the morning of surgery with your regular toothpaste.  Do not eat or drink after midnight the night before surgery.    Take these medicines the morning of surgery with A SIP OF WATER :   aspirin   clopidogrel (PLAVIX)  pantoprazole (PROTONIX)  If needed: Oxycodone for pain If needed: albuterol (PROVENTIL) (2.5 MG/3ML) 0.083% nebulizer solution for wheezing or shortness of breath. If needed: albuterol (PROVENTIL HFA;VENTOLIN HFA)  inhaler for wheezing or shortness of breath ( Bring inhaler in  with you on day of surgery). If needed: nitroGLYCERIN (NITROSTAT) for chest pain  Follow your surgeon's instructions regarding Plavix. If no pre-op instructions were provided, please call your surgeon's office for those instructions.  Stop taking  vitamins, fish oil and herbal medications. Do not take any NSAIDs ie: Ibuprofen, Advil, Naproxen (Aleve), Motrin, BC and Goody Powder; stop now.  Do not smoke ( tobacco, cigars, pipes, snuff / chew or vaping ) 24 hours prior to your procedure.    Do not wear jewelry, make-up or nail polish.  Do not wear lotions, powders, or perfumes, or deodorant.  Do not shave 48 hours prior to surgery.    Do not bring valuables to the hospital.  [facility] is not responsible for any belongings or valuables.  Contacts, dentures or bridgework may not be worn into surgery.  Leave your suitcase in the car.  After surgery it may be brought to your room.  For patients admitted to the hospital, discharge time will be determined by your treatment team.  Patients discharged the day of surgery will not be allowed to drive home.   Special instructions: See " [facility] Preparing For Surgery" sheet.  Please read over the  following fact sheets that you were given. Pain Booklet, Coughing and Deep Breathing and Surgical Site Infection Prevention

## 2020-03-15 NOTE — Progress Notes (Signed)
Pt denies any acute pulmonary issues. Pt denies chest pain. Pt stated that she is under the care of Dr. Ottie Glazier, Cardiology and Charlott Holler, NP, PCP.  Pt denies having a chest x ray in the last year. Olene Floss, CMA, Surgical Coordinator, stated that pt is to continue taking Plavix and take on DOS. Pt reminded to quarantine. Pt verbalized understanding of all pre-op instructions. Pt chart forwarded to PA, Anesthesiology for review.

## 2020-03-16 NOTE — Progress Notes (Signed)
Anesthesia Chart Review:  Recent admission 6/22-6/24/21. Initially presented with bleeding from varicose veins which was stopped in the ED. She also complained of significant rest pain especially when elevating her leg.  She underwent aortogram with left common and external iliac artery angioplasty and stenting as well as diagnostic right lower extremity arteriogram.  She tolerated this procedure well and was admitted postoperatively.  Left lower extremity rest pain resolved with iliac stenting however it was noted she would require right lower extremity bypass graft to be done as an outpatient. Echo done during admission showed EF 55-60%, no significant valvular abnormalities.   Follows with cardiology for hx of CAD (nonobstructive by cath 2014, myoview 2020 low risk), HFpEF, PVCs controlled on verapamil, chronic LE edema. Last seen 07/28/19, stable at that time. Subsequently given cardiac clearance 09/25/19 for endoscopic procedure.   Preop labs reviewed, unremarkable.   EKG 03/08/20: Sinus rhythm. Rate 97. poor r wave progression  TTE 03/10/20: 1. Left ventricular ejection fraction, by estimation, is 55 to 60%. The  left ventricle has normal function. The left ventricle demonstrates  regional wall motion abnormalities (see scoring diagram/findings for  description). Basal to mid inferior  hypokinesis. There is mild left ventricular hypertrophy. Left ventricular  diastolic parameters are indeterminate.  2. Right ventricular systolic function is normal. The right ventricular  size is normal. Tricuspid regurgitation signal is inadequate for assessing  PA pressure.  3. The mitral valve is normal in structure. Trivial mitral valve  regurgitation.  4. The aortic valve is tricuspid. Aortic valve regurgitation is not  visualized. Mild to moderate aortic valve sclerosis/calcification is  present, without any evidence of aortic stenosis.  5. The inferior vena cava is normal in size with greater  than 50%  respiratory variability, suggesting right atrial pressure of 3 mmHg.   Nuclear stress 04/22/19:  Nuclear stress EF: 70%.  There was no ST segment deviation noted during stress.  The study is normal.  This is a low risk study.  The left ventricular ejection fraction is hyperdynamic (>65%).  No change compared to prior study.   Wynonia Musty [facility] Short Stay Center/Anesthesiology Phone [phone] 03/16/2020 1:51 PM

## 2020-03-16 NOTE — Anesthesia Preprocedure Evaluation (Addendum)
Anesthesia Evaluation  Patient identified by MRN, date of birth, ID band Patient awake    Reviewed: Allergy & Precautions, NPO status , Patient's Chart, lab work & pertinent test results  Airway Mallampati: II  TM Distance: >3 FB Neck ROM: Full    Dental  (+) Edentulous Upper, Edentulous Lower, Dental Advisory Given   Pulmonary asthma , pneumonia, COPD, Current Smoker and Patient abstained from smoking.,    Pulmonary exam normal breath sounds clear to auscultation       Cardiovascular hypertension, Pt. on medications + CAD, + Peripheral Vascular Disease and +CHF   Rhythm:Regular Rate:Normal + Systolic murmurs ECHO 03/5101 1. Left ventricular ejection fraction, by estimation, is 55 to 60%. The left ventricle has normal function. The left ventricle demonstrates regional wall motion abnormalities (see scoring diagram/findings for description). Basal to mid inferior hypokinesis. There is mild left ventricular hypertrophy. Left ventricular diastolic parameters are indeterminate.  2. Right ventricular systolic function is normal. The right ventricular size is normal. Tricuspid regurgitation signal is inadequate for assessing PA pressure.  3. The mitral valve is normal in structure. Trivial mitral valve regurgitation.  4. The aortic valve is tricuspid. Aortic valve regurgitation is not visualized. Mild to moderate aortic valve sclerosis/calcification is present, without any evidence of aortic stenosis.  5. The inferior vena cava is normal in size with greater than 50% respiratory variability, suggesting right atrial pressure of 3 mmHg.    Neuro/Psych PSYCHIATRIC DISORDERS Anxiety negative neurological ROS     GI/Hepatic Neg liver ROS, PUD, GERD  ,  Endo/Other  negative endocrine ROS  Renal/GU negative Renal ROS     Musculoskeletal  (+) Arthritis ,   Abdominal   Peds  Hematology negative hematology ROS (+) anemia ,    Anesthesia Other Findings   Reproductive/Obstetrics                         Anesthesia Physical Anesthesia Plan  ASA: III  Anesthesia Plan: General   Post-op Pain Management:    Induction: Intravenous  PONV Risk Score and Plan: 3 and Ondansetron and Treatment may vary due to age or medical condition  Airway Management Planned: Oral ETT  Additional Equipment:   Intra-op Plan:   Post-operative Plan: Extubation in OR  Informed Consent: I have reviewed the patients History and Physical, chart, labs and discussed the procedure including the risks, benefits and alternatives for the proposed anesthesia with the patient or authorized representative who has indicated his/her understanding and acceptance.     Dental advisory given  Plan Discussed with: CRNA  Anesthesia Plan Comments: (PAT note by Karoline Caldwell, PA-C: Recent admission 6/22-6/24/21. Initially presented with bleeding from varicose veins which was stopped in the ED. She also complained of significant rest pain especially when elevating her leg.  She underwent aortogram with left common and external iliac artery angioplasty and stenting as well as diagnostic right lower extremity arteriogram.  She tolerated this procedure well and was admitted postoperatively.  Left lower extremity rest pain resolved with iliac stenting however it was noted she would require right lower extremity bypass graft to be done as an outpatient. Echo done during admission showed EF 55-60%, no significant valvular abnormalities.   Follows with cardiology for hx of CAD (nonobstructive by cath 2014, myoview 2020 low risk), HFpEF, PVCs controlled on verapamil, chronic LE edema. Last seen 07/28/19, stable at that time. Subsequently given cardiac clearance 09/25/19 for endoscopic procedure.   Preop labs reviewed, unremarkable.  EKG 03/08/20: Sinus rhythm. Rate 97. poor r wave progression  TTE 03/10/20: 1. Left ventricular ejection  fraction, by estimation, is 55 to 60%. The  left ventricle has normal function. The left ventricle demonstrates  regional wall motion abnormalities (see scoring diagram/findings for  description). Basal to mid inferior  hypokinesis. There is mild left ventricular hypertrophy. Left ventricular  diastolic parameters are indeterminate.  2. Right ventricular systolic function is normal. The right ventricular  size is normal. Tricuspid regurgitation signal is inadequate for assessing  PA pressure.  3. The mitral valve is normal in structure. Trivial mitral valve  regurgitation.  4. The aortic valve is tricuspid. Aortic valve regurgitation is not  visualized. Mild to moderate aortic valve sclerosis/calcification is  present, without any evidence of aortic stenosis.  5. The inferior vena cava is normal in size with greater than 50%  respiratory variability, suggesting right atrial pressure of 3 mmHg.   Nuclear stress 04/22/19: Nuclear stress EF: 70%. There was no ST segment deviation noted during stress. The study is normal. This is a low risk study. The left ventricular ejection fraction is hyperdynamic (>65%). No change compared to prior study. )      Anesthesia Quick Evaluation

## 2020-03-18 ENCOUNTER — Inpatient Hospital Stay (HOSPITAL_COMMUNITY): Payer: Medicare Other | Admitting: Anesthesiology

## 2020-03-18 ENCOUNTER — Encounter (HOSPITAL_COMMUNITY)
Admission: RE | Disposition: A | Discharge status: Home / Self Care | Payer: Self-pay | Source: Home / Self Care | Attending: Vascular Surgery

## 2020-03-18 ENCOUNTER — Other Ambulatory Visit: Payer: Self-pay

## 2020-03-18 ENCOUNTER — Inpatient Hospital Stay (HOSPITAL_COMMUNITY): Payer: Medicare Other

## 2020-03-18 ENCOUNTER — Inpatient Hospital Stay (HOSPITAL_COMMUNITY)
Admission: RE | Admit: 2020-03-18 | Discharge: 2020-03-22 | DRG: 253 | Disposition: A | Discharge status: Home / Self Care | Payer: Medicare Other | Attending: Vascular Surgery | Admitting: Vascular Surgery

## 2020-03-18 ENCOUNTER — Encounter (HOSPITAL_COMMUNITY): Payer: Self-pay | Admitting: Vascular Surgery

## 2020-03-18 ENCOUNTER — Inpatient Hospital Stay (HOSPITAL_COMMUNITY): Payer: Medicare Other | Admitting: Vascular Surgery

## 2020-03-18 DIAGNOSIS — I83891 Varicose veins of right lower extremities with other complications: Secondary | ICD-10-CM | POA: Diagnosis present

## 2020-03-18 DIAGNOSIS — I739 Peripheral vascular disease, unspecified: Secondary | ICD-10-CM | POA: Diagnosis not present

## 2020-03-18 DIAGNOSIS — Z7983 Long term (current) use of bisphosphonates: Secondary | ICD-10-CM

## 2020-03-18 DIAGNOSIS — Z91040 Latex allergy status: Secondary | ICD-10-CM | POA: Diagnosis not present

## 2020-03-18 DIAGNOSIS — Z7982 Long term (current) use of aspirin: Secondary | ICD-10-CM | POA: Diagnosis not present

## 2020-03-18 DIAGNOSIS — I5032 Chronic diastolic (congestive) heart failure: Secondary | ICD-10-CM | POA: Diagnosis present

## 2020-03-18 DIAGNOSIS — I70221 Atherosclerosis of native arteries of extremities with rest pain, right leg: Principal | ICD-10-CM | POA: Diagnosis present

## 2020-03-18 DIAGNOSIS — J449 Chronic obstructive pulmonary disease, unspecified: Secondary | ICD-10-CM | POA: Diagnosis present

## 2020-03-18 DIAGNOSIS — F17210 Nicotine dependence, cigarettes, uncomplicated: Secondary | ICD-10-CM | POA: Diagnosis present

## 2020-03-18 DIAGNOSIS — I110 Hypertensive heart disease with heart failure: Secondary | ICD-10-CM | POA: Diagnosis present

## 2020-03-18 DIAGNOSIS — Z8711 Personal history of peptic ulcer disease: Secondary | ICD-10-CM

## 2020-03-18 DIAGNOSIS — Z96643 Presence of artificial hip joint, bilateral: Secondary | ICD-10-CM | POA: Diagnosis present

## 2020-03-18 DIAGNOSIS — Z20822 Contact with and (suspected) exposure to covid-19: Secondary | ICD-10-CM | POA: Diagnosis not present

## 2020-03-18 DIAGNOSIS — Z8249 Family history of ischemic heart disease and other diseases of the circulatory system: Secondary | ICD-10-CM

## 2020-03-18 DIAGNOSIS — D62 Acute posthemorrhagic anemia: Secondary | ICD-10-CM | POA: Diagnosis not present

## 2020-03-18 DIAGNOSIS — I10 Essential (primary) hypertension: Secondary | ICD-10-CM | POA: Diagnosis not present

## 2020-03-18 DIAGNOSIS — I708 Atherosclerosis of other arteries: Secondary | ICD-10-CM | POA: Diagnosis not present

## 2020-03-18 DIAGNOSIS — Z79899 Other long term (current) drug therapy: Secondary | ICD-10-CM | POA: Diagnosis not present

## 2020-03-18 DIAGNOSIS — G8918 Other acute postprocedural pain: Secondary | ICD-10-CM | POA: Diagnosis not present

## 2020-03-18 DIAGNOSIS — M810 Age-related osteoporosis without current pathological fracture: Secondary | ICD-10-CM | POA: Diagnosis present

## 2020-03-18 DIAGNOSIS — I2510 Atherosclerotic heart disease of native coronary artery without angina pectoris: Secondary | ICD-10-CM | POA: Diagnosis present

## 2020-03-18 DIAGNOSIS — K219 Gastro-esophageal reflux disease without esophagitis: Secondary | ICD-10-CM | POA: Diagnosis present

## 2020-03-18 DIAGNOSIS — Z419 Encounter for procedure for purposes other than remedying health state, unspecified: Secondary | ICD-10-CM

## 2020-03-18 DIAGNOSIS — Z9889 Other specified postprocedural states: Secondary | ICD-10-CM | POA: Diagnosis not present

## 2020-03-18 HISTORY — PX: FEMORAL-POPLITEAL BYPASS GRAFT: SHX937

## 2020-03-18 HISTORY — PX: INSERTION OF ILIAC STENT: SHX6256

## 2020-03-18 HISTORY — PX: PATCH ANGIOPLASTY: SHX6230

## 2020-03-18 HISTORY — PX: ENDARTERECTOMY FEMORAL: SHX5804

## 2020-03-18 HISTORY — PX: LOWER EXTREMITY ANGIOGRAM: SHX5508

## 2020-03-18 LAB — CBC
HCT: 23.9 % — ABNORMAL LOW (ref 36.0–46.0)
Hemoglobin: 7.7 g/dL — ABNORMAL LOW (ref 12.0–15.0)
MCH: 29.5 pg (ref 26.0–34.0)
MCHC: 32.2 g/dL (ref 30.0–36.0)
MCV: 91.6 fL (ref 80.0–100.0)
Platelets: 229 10*3/uL (ref 150–400)
RBC: 2.61 MIL/uL — ABNORMAL LOW (ref 3.87–5.11)
RDW: 19.5 % — ABNORMAL HIGH (ref 11.5–15.5)
WBC: 11.2 10*3/uL — ABNORMAL HIGH (ref 4.0–10.5)
nRBC: 0.3 % — ABNORMAL HIGH (ref 0.0–0.2)

## 2020-03-18 LAB — PREPARE RBC (CROSSMATCH)

## 2020-03-18 LAB — BASIC METABOLIC PANEL
Anion gap: 8 (ref 5–15)
BUN: 17 mg/dL (ref 8–23)
CO2: 25 mmol/L (ref 22–32)
Calcium: 8.2 mg/dL — ABNORMAL LOW (ref 8.9–10.3)
Chloride: 102 mmol/L (ref 98–111)
Creatinine, Ser: 0.87 mg/dL (ref 0.44–1.00)
GFR calc Af Amer: >60 mL/min (ref >60)
GFR calc non Af Amer: >60 mL/min (ref >60)
Glucose, Bld: 160 mg/dL — ABNORMAL HIGH (ref 70–99)
Potassium: 4.4 mmol/L (ref 3.5–5.1)
Sodium: 135 mmol/L (ref 135–145)

## 2020-03-18 SURGERY — BYPASS GRAFT FEMORAL-POPLITEAL ARTERY
Anesthesia: General | Site: Groin | Laterality: Right

## 2020-03-18 MED ORDER — ONDANSETRON HCL 4 MG/2ML IJ SOLN: 4.0000 mg | Freq: Once | INTRAMUSCULAR | Status: DC | PRN

## 2020-03-18 MED ORDER — FENTANYL CITRATE (PF) 250 MCG/5ML IJ SOLN
INTRAMUSCULAR | Status: DC | PRN
  Administered 2020-03-18: 50 ug via INTRAVENOUS
  Administered 2020-03-18: 100 ug via INTRAVENOUS

## 2020-03-18 MED ORDER — CEFAZOLIN SODIUM-DEXTROSE 2-4 GM/100ML-% IV SOLN
2.0000 g | INTRAVENOUS | Status: AC
  Administered 2020-03-18: 2 g via INTRAVENOUS
  Filled 2020-03-18: qty 100

## 2020-03-18 MED ORDER — ACETAMINOPHEN 325 MG RE SUPP
325.0000 mg | RECTAL | Status: DC | PRN
  Filled 2020-03-18: qty 2

## 2020-03-18 MED ORDER — OXYCODONE HCL 5 MG PO TABS
ORAL_TABLET | ORAL | Status: AC
  Filled 2020-03-18: qty 2

## 2020-03-18 MED ORDER — HEPARIN SODIUM (PORCINE) 1000 UNIT/ML IJ SOLN
INTRAMUSCULAR | Status: DC | PRN
Start: 2020-03-18 — End: 2020-03-18
  Administered 2020-03-18: 1000 [IU] via INTRAVENOUS
  Administered 2020-03-18: 6000 [IU] via INTRAVENOUS
  Administered 2020-03-18: 2000 [IU] via INTRAVENOUS

## 2020-03-18 MED ORDER — EZETIMIBE 10 MG PO TABS
10.0000 mg | ORAL_TABLET | Freq: Every day | ORAL | Status: DC
  Administered 2020-03-18 – 2020-03-21 (×4): 10 mg via ORAL
  Filled 2020-03-18 (×4): qty 1

## 2020-03-18 MED ORDER — PANTOPRAZOLE SODIUM 40 MG PO TBEC: 40.0000 mg | DELAYED_RELEASE_TABLET | ORAL | Status: DC

## 2020-03-18 MED ORDER — HYDROMORPHONE HCL 1 MG/ML IJ SOLN
INTRAMUSCULAR | Status: AC
  Filled 2020-03-18: qty 1

## 2020-03-18 MED ORDER — SODIUM CHLORIDE 0.9 % IV SOLN
INTRAVENOUS | Status: AC
  Filled 2020-03-18: qty 1.2

## 2020-03-18 MED ORDER — ALENDRONATE SODIUM 70 MG PO TABS: 70.0000 mg | ORAL_TABLET | ORAL | Status: DC

## 2020-03-18 MED ORDER — LIDOCAINE HCL (CARDIAC) PF 100 MG/5ML IV SOSY
PREFILLED_SYRINGE | INTRAVENOUS | Status: DC | PRN
Start: 2020-03-18 — End: 2020-03-18
  Administered 2020-03-18: 50 mg via INTRATRACHEAL

## 2020-03-18 MED ORDER — GUAIFENESIN-DM 100-10 MG/5ML PO SYRP: 15.0000 mL | ORAL_SOLUTION | ORAL | Status: DC | PRN

## 2020-03-18 MED ORDER — ALLOPURINOL 300 MG PO TABS
300.0000 mg | ORAL_TABLET | Freq: Every day | ORAL | Status: DC
  Administered 2020-03-18 – 2020-03-21 (×4): 300 mg via ORAL
  Filled 2020-03-18 (×4): qty 1

## 2020-03-18 MED ORDER — HEPARIN SODIUM (PORCINE) 1000 UNIT/ML IJ SOLN
INTRAMUSCULAR | Status: AC
  Filled 2020-03-18: qty 1

## 2020-03-18 MED ORDER — PHENOL 1.4 % MT LIQD: 1.0000 | OROMUCOSAL | Status: DC | PRN

## 2020-03-18 MED ORDER — HEMOSTATIC AGENTS (NO CHARGE) OPTIME
TOPICAL | Status: DC | PRN
  Administered 2020-03-18: 2 via TOPICAL
  Administered 2020-03-18 (×5): 1 via TOPICAL

## 2020-03-18 MED ORDER — CEFAZOLIN SODIUM-DEXTROSE 2-4 GM/100ML-% IV SOLN
2.0000 g | Freq: Three times a day (TID) | INTRAVENOUS | Status: AC
  Administered 2020-03-18 – 2020-03-19 (×2): 2 g via INTRAVENOUS
  Filled 2020-03-18 (×2): qty 100

## 2020-03-18 MED ORDER — ALBUTEROL SULFATE HFA 108 (90 BASE) MCG/ACT IN AERS
INHALATION_SPRAY | RESPIRATORY_TRACT | Status: DC | PRN
  Administered 2020-03-18: 2 via RESPIRATORY_TRACT

## 2020-03-18 MED ORDER — OXYCODONE HCL 5 MG PO TABS
5.0000 mg | ORAL_TABLET | ORAL | Status: DC | PRN
  Administered 2020-03-18 – 2020-03-19 (×2): 10 mg via ORAL
  Filled 2020-03-18: qty 2

## 2020-03-18 MED ORDER — DIPHENHYDRAMINE HCL 25 MG PO CAPS
50.0000 mg | ORAL_CAPSULE | Freq: Every day | ORAL | Status: DC
  Administered 2020-03-19 – 2020-03-21 (×3): 50 mg via ORAL
  Filled 2020-03-18 (×4): qty 2

## 2020-03-18 MED ORDER — ACETAMINOPHEN 325 MG PO TABS
325.0000 mg | ORAL_TABLET | ORAL | Status: DC | PRN
  Administered 2020-03-18 – 2020-03-21 (×2): 650 mg via ORAL
  Filled 2020-03-18: qty 2

## 2020-03-18 MED ORDER — HYDROMORPHONE HCL 1 MG/ML IJ SOLN
0.2500 mg | INTRAMUSCULAR | Status: DC | PRN
  Administered 2020-03-18 (×4): 0.5 mg via INTRAVENOUS

## 2020-03-18 MED ORDER — SODIUM CHLORIDE 0.9 % IV SOLN: INTRAVENOUS | Status: DC

## 2020-03-18 MED ORDER — ROCURONIUM BROMIDE 100 MG/10ML IV SOLN
INTRAVENOUS | Status: DC | PRN
  Administered 2020-03-18: 30 mg via INTRAVENOUS

## 2020-03-18 MED ORDER — HYDROMORPHONE HCL 1 MG/ML IJ SOLN
0.5000 mg | INTRAMUSCULAR | Status: DC | PRN
  Administered 2020-03-18: 1 mg via INTRAVENOUS
  Administered 2020-03-19 – 2020-03-20 (×4): 0.5 mg via INTRAVENOUS
  Filled 2020-03-18 (×5): qty 1

## 2020-03-18 MED ORDER — FUROSEMIDE 40 MG PO TABS: 40.0000 mg | ORAL_TABLET | Freq: Every day | ORAL | Status: DC

## 2020-03-18 MED ORDER — POTASSIUM CHLORIDE CRYS ER 20 MEQ PO TBCR: 20.0000 meq | EXTENDED_RELEASE_TABLET | Freq: Every day | ORAL | Status: DC | PRN

## 2020-03-18 MED ORDER — 0.9 % SODIUM CHLORIDE (POUR BTL) OPTIME
TOPICAL | Status: DC | PRN
  Administered 2020-03-18 (×3): 1000 mL

## 2020-03-18 MED ORDER — SENNOSIDES-DOCUSATE SODIUM 8.6-50 MG PO TABS: 1.0000 | ORAL_TABLET | Freq: Every evening | ORAL | Status: DC | PRN

## 2020-03-18 MED ORDER — LACTULOSE 10 GM/15ML PO SOLN: 20.0000 g | ORAL | Status: DC | PRN

## 2020-03-18 MED ORDER — POTASSIUM CHLORIDE CRYS ER 20 MEQ PO TBCR: 20.0000 meq | EXTENDED_RELEASE_TABLET | Freq: Every day | ORAL | Status: DC

## 2020-03-18 MED ORDER — SODIUM CHLORIDE 0.9 % IV SOLN
INTRAVENOUS | Status: DC | PRN
  Administered 2020-03-18: 500 mL

## 2020-03-18 MED ORDER — SUGAMMADEX SODIUM 200 MG/2ML IV SOLN
INTRAVENOUS | Status: DC | PRN
Start: 2020-03-18 — End: 2020-03-18
  Administered 2020-03-18: 200 mg via INTRAVENOUS

## 2020-03-18 MED ORDER — DEXAMETHASONE SODIUM PHOSPHATE 10 MG/ML IJ SOLN
INTRAMUSCULAR | Status: AC
  Filled 2020-03-18: qty 1

## 2020-03-18 MED ORDER — SPIRONOLACTONE 25 MG PO TABS: 25.0000 mg | ORAL_TABLET | Freq: Every day | ORAL | Status: DC

## 2020-03-18 MED ORDER — PROTAMINE SULFATE 10 MG/ML IV SOLN
INTRAVENOUS | Status: DC | PRN
Start: 2020-03-18 — End: 2020-03-18
  Administered 2020-03-18: 10 mg via INTRAVENOUS
  Administered 2020-03-18: 30 mg via INTRAVENOUS

## 2020-03-18 MED ORDER — ALBUMIN HUMAN 5 % IV SOLN: INTRAVENOUS | Status: DC | PRN

## 2020-03-18 MED ORDER — ALBUTEROL SULFATE (2.5 MG/3ML) 0.083% IN NEBU: 2.5000 mg | INHALATION_SOLUTION | Freq: Four times a day (QID) | RESPIRATORY_TRACT | Status: DC | PRN

## 2020-03-18 MED ORDER — ALBUTEROL SULFATE HFA 108 (90 BASE) MCG/ACT IN AERS: 2.0000 | INHALATION_SPRAY | RESPIRATORY_TRACT | Status: DC

## 2020-03-18 MED ORDER — DOCUSATE SODIUM 100 MG PO CAPS
100.0000 mg | ORAL_CAPSULE | Freq: Every day | ORAL | Status: DC
  Administered 2020-03-19 – 2020-03-22 (×4): 100 mg via ORAL
  Filled 2020-03-18 (×4): qty 1

## 2020-03-18 MED ORDER — VERAPAMIL HCL ER 240 MG PO TBCR
240.0000 mg | EXTENDED_RELEASE_TABLET | Freq: Every day | ORAL | Status: DC
  Administered 2020-03-19 – 2020-03-21 (×3): 240 mg via ORAL
  Filled 2020-03-18 (×6): qty 1

## 2020-03-18 MED ORDER — ALLOPURINOL 300 MG PO TABS: 300.0000 mg | ORAL_TABLET | Freq: Every day | ORAL | Status: DC

## 2020-03-18 MED ORDER — LIDOCAINE 2% (20 MG/ML) 5 ML SYRINGE
INTRAMUSCULAR | Status: AC
  Filled 2020-03-18: qty 10

## 2020-03-18 MED ORDER — METOPROLOL TARTRATE 5 MG/5ML IV SOLN: 2.0000 mg | INTRAVENOUS | Status: DC | PRN

## 2020-03-18 MED ORDER — ASPIRIN EC 81 MG PO TBEC
81.0000 mg | DELAYED_RELEASE_TABLET | Freq: Every day | ORAL | Status: DC
  Administered 2020-03-19 – 2020-03-22 (×4): 81 mg via ORAL
  Filled 2020-03-18 (×4): qty 1

## 2020-03-18 MED ORDER — FENTANYL CITRATE (PF) 250 MCG/5ML IJ SOLN
INTRAMUSCULAR | Status: AC
  Filled 2020-03-18: qty 5

## 2020-03-18 MED ORDER — CHLORHEXIDINE GLUCONATE CLOTH 2 % EX PADS: 6.0000 | MEDICATED_PAD | Freq: Every day | CUTANEOUS | Status: DC

## 2020-03-18 MED ORDER — MAGNESIUM SULFATE 2 GM/50ML IV SOLN: 2.0000 g | Freq: Every day | INTRAVENOUS | Status: DC | PRN

## 2020-03-18 MED ORDER — ATORVASTATIN CALCIUM 80 MG PO TABS
80.0000 mg | ORAL_TABLET | Freq: Every day | ORAL | Status: DC
  Administered 2020-03-18 – 2020-03-21 (×4): 80 mg via ORAL
  Filled 2020-03-18 (×4): qty 1

## 2020-03-18 MED ORDER — EZETIMIBE 10 MG PO TABS: 10.0000 mg | ORAL_TABLET | Freq: Every day | ORAL | Status: DC

## 2020-03-18 MED ORDER — MIDAZOLAM HCL 5 MG/5ML IJ SOLN
INTRAMUSCULAR | Status: DC | PRN
Start: 2020-03-18 — End: 2020-03-18
  Administered 2020-03-18: 1 mg via INTRAVENOUS

## 2020-03-18 MED ORDER — PANTOPRAZOLE SODIUM 40 MG PO TBEC
40.0000 mg | DELAYED_RELEASE_TABLET | Freq: Two times a day (BID) | ORAL | Status: DC
  Administered 2020-03-18 – 2020-03-22 (×8): 40 mg via ORAL
  Filled 2020-03-18 (×8): qty 1

## 2020-03-18 MED ORDER — PROTAMINE SULFATE 10 MG/ML IV SOLN
INTRAVENOUS | Status: AC
  Filled 2020-03-18: qty 5

## 2020-03-18 MED ORDER — ALBUTEROL SULFATE (2.5 MG/3ML) 0.083% IN NEBU: 3.0000 mL | INHALATION_SOLUTION | Freq: Four times a day (QID) | RESPIRATORY_TRACT | Status: DC | PRN

## 2020-03-18 MED ORDER — BISACODYL 5 MG PO TBEC
5.0000 mg | DELAYED_RELEASE_TABLET | Freq: Every day | ORAL | Status: DC | PRN
  Administered 2020-03-20: 5 mg via ORAL
  Filled 2020-03-18: qty 1

## 2020-03-18 MED ORDER — FUROSEMIDE 40 MG PO TABS
40.0000 mg | ORAL_TABLET | Freq: Every morning | ORAL | Status: DC
  Administered 2020-03-19 – 2020-03-22 (×4): 40 mg via ORAL
  Filled 2020-03-18 (×4): qty 1

## 2020-03-18 MED ORDER — ACETAMINOPHEN 325 MG PO TABS
ORAL_TABLET | ORAL | Status: AC
  Filled 2020-03-18: qty 2

## 2020-03-18 MED ORDER — LABETALOL HCL 5 MG/ML IV SOLN: 10.0000 mg | INTRAVENOUS | Status: DC | PRN

## 2020-03-18 MED ORDER — ONDANSETRON HCL 4 MG/2ML IJ SOLN: 4.0000 mg | Freq: Four times a day (QID) | INTRAMUSCULAR | Status: DC | PRN

## 2020-03-18 MED ORDER — SODIUM CHLORIDE 0.9 % IV SOLN: 10.0000 mL/h | Freq: Once | INTRAVENOUS | Status: DC

## 2020-03-18 MED ORDER — CHLORHEXIDINE GLUCONATE 0.12 % MT SOLN
OROMUCOSAL | Status: AC
  Administered 2020-03-18: 15 mL
  Filled 2020-03-18: qty 15

## 2020-03-18 MED ORDER — POTASSIUM CHLORIDE CRYS ER 20 MEQ PO TBCR
20.0000 meq | EXTENDED_RELEASE_TABLET | Freq: Every morning | ORAL | Status: DC
  Administered 2020-03-19 – 2020-03-22 (×4): 20 meq via ORAL
  Filled 2020-03-18 (×4): qty 1

## 2020-03-18 MED ORDER — ASPIRIN EC 81 MG PO TBEC: 81.0000 mg | DELAYED_RELEASE_TABLET | Freq: Every day | ORAL | Status: DC

## 2020-03-18 MED ORDER — CLOPIDOGREL BISULFATE 75 MG PO TABS
75.0000 mg | ORAL_TABLET | Freq: Every day | ORAL | Status: DC
  Administered 2020-03-19 – 2020-03-22 (×4): 75 mg via ORAL
  Filled 2020-03-18 (×4): qty 1

## 2020-03-18 MED ORDER — VERAPAMIL HCL ER 240 MG PO TBCR: 240.0000 mg | EXTENDED_RELEASE_TABLET | Freq: Every evening | ORAL | Status: DC

## 2020-03-18 MED ORDER — SPIRONOLACTONE 25 MG PO TABS
25.0000 mg | ORAL_TABLET | Freq: Every morning | ORAL | Status: DC
  Administered 2020-03-19 – 2020-03-22 (×4): 25 mg via ORAL
  Filled 2020-03-18 (×4): qty 1

## 2020-03-18 MED ORDER — ATORVASTATIN CALCIUM 80 MG PO TABS: 80.0000 mg | ORAL_TABLET | Freq: Every day | ORAL | Status: DC

## 2020-03-18 MED ORDER — DEXAMETHASONE SODIUM PHOSPHATE 10 MG/ML IJ SOLN
INTRAMUSCULAR | Status: DC | PRN
  Administered 2020-03-18: 8 mg via INTRAVENOUS

## 2020-03-18 MED ORDER — MIDAZOLAM HCL 2 MG/2ML IJ SOLN
INTRAMUSCULAR | Status: AC
  Filled 2020-03-18: qty 2

## 2020-03-18 MED ORDER — HYDRALAZINE HCL 20 MG/ML IJ SOLN: 5.0000 mg | INTRAMUSCULAR | Status: DC | PRN

## 2020-03-18 MED ORDER — CHLORHEXIDINE GLUCONATE CLOTH 2 % EX PADS: 6.0000 | MEDICATED_PAD | Freq: Once | CUTANEOUS | Status: DC

## 2020-03-18 MED ORDER — PROPOFOL 10 MG/ML IV BOLUS
INTRAVENOUS | Status: AC
  Filled 2020-03-18: qty 20

## 2020-03-18 MED ORDER — HEPARIN SODIUM (PORCINE) 5000 UNIT/ML IJ SOLN
5000.0000 [IU] | Freq: Three times a day (TID) | INTRAMUSCULAR | Status: DC
  Administered 2020-03-19 – 2020-03-22 (×10): 5000 [IU] via SUBCUTANEOUS
  Filled 2020-03-18 (×10): qty 1

## 2020-03-18 MED ORDER — SODIUM CHLORIDE (PF) 0.9 % IJ SOLN
INTRAVENOUS | Status: DC | PRN
  Administered 2020-03-18: 100 mL via INTRAMUSCULAR

## 2020-03-18 MED ORDER — PHENYLEPHRINE HCL-NACL 10-0.9 MG/250ML-% IV SOLN
INTRAVENOUS | Status: DC | PRN
Start: 2020-03-18 — End: 2020-03-18
  Administered 2020-03-18: 25 ug/min via INTRAVENOUS

## 2020-03-18 MED ORDER — PANTOPRAZOLE SODIUM 40 MG PO TBEC: 40.0000 mg | DELAYED_RELEASE_TABLET | Freq: Every day | ORAL | Status: DC

## 2020-03-18 MED ORDER — SENNOSIDES-DOCUSATE SODIUM 8.6-50 MG PO TABS
2.0000 | ORAL_TABLET | Freq: Every day | ORAL | Status: DC
  Administered 2020-03-18 – 2020-03-21 (×4): 2 via ORAL
  Filled 2020-03-18 (×4): qty 2

## 2020-03-18 MED ORDER — ALUM & MAG HYDROXIDE-SIMETH 200-200-20 MG/5ML PO SUSP: 15.0000 mL | ORAL | Status: DC | PRN

## 2020-03-18 MED ORDER — ROCURONIUM BROMIDE 10 MG/ML (PF) SYRINGE
PREFILLED_SYRINGE | INTRAVENOUS | Status: AC
  Filled 2020-03-18: qty 20

## 2020-03-18 MED ORDER — ONDANSETRON HCL 4 MG/2ML IJ SOLN
INTRAMUSCULAR | Status: AC
  Filled 2020-03-18: qty 2

## 2020-03-18 MED ORDER — NITROGLYCERIN 0.4 MG SL SUBL: 0.4000 mg | SUBLINGUAL_TABLET | SUBLINGUAL | Status: DC | PRN

## 2020-03-18 MED ORDER — PROPOFOL 10 MG/ML IV BOLUS
INTRAVENOUS | Status: DC | PRN
  Administered 2020-03-18: 80 mg via INTRAVENOUS

## 2020-03-18 MED ORDER — MEPERIDINE HCL 25 MG/ML IJ SOLN: 6.2500 mg | INTRAMUSCULAR | Status: DC | PRN

## 2020-03-18 MED ORDER — ONDANSETRON HCL 4 MG/2ML IJ SOLN
INTRAMUSCULAR | Status: DC | PRN
  Administered 2020-03-18: 4 mg via INTRAVENOUS

## 2020-03-18 MED ORDER — LACTATED RINGERS IV SOLN: INTRAVENOUS | Status: DC | PRN

## 2020-03-18 MED ORDER — SODIUM CHLORIDE 0.9 % IV SOLN: 500.0000 mL | Freq: Once | INTRAVENOUS | Status: DC | PRN

## 2020-03-18 SURGICAL SUPPLY — 85 items
BANDAGE ESMARK 6X9 LF (GAUZE/BANDAGES/DRESSINGS) IMPLANT
BLADE CLIPPER SURG (BLADE) ×4 IMPLANT
BNDG ESMARK 6X9 LF (GAUZE/BANDAGES/DRESSINGS)
CANISTER SUCT 3000ML PPV (MISCELLANEOUS) ×4 IMPLANT
CLIP FOGARTY SPRING 6M (CLIP) ×4 IMPLANT
CLIP VESOCCLUDE MED 24/CT (CLIP) ×4 IMPLANT
CLIP VESOCCLUDE SM WIDE 24/CT (CLIP) ×4 IMPLANT
COVER PROBE W GEL 5X96 (DRAPES) ×4 IMPLANT
CUFF TOURN SGL QUICK 24 (TOURNIQUET CUFF)
CUFF TOURN SGL QUICK 34 (TOURNIQUET CUFF)
CUFF TOURN SGL QUICK 42 (TOURNIQUET CUFF) IMPLANT
CUFF TRNQT CYL 24X4X16.5-23 (TOURNIQUET CUFF) IMPLANT
CUFF TRNQT CYL 34X4.125X (TOURNIQUET CUFF) IMPLANT
DERMABOND ADVANCED (GAUZE/BANDAGES/DRESSINGS) ×8
DERMABOND ADVANCED .7 DNX12 (GAUZE/BANDAGES/DRESSINGS) ×8 IMPLANT
DEVICE TORQUE KENDALL .025-038 (MISCELLANEOUS) ×4 IMPLANT
DRAIN CHANNEL 15F RND FF W/TCR (WOUND CARE) IMPLANT
DRAPE C-ARM 42X72 X-RAY (DRAPES) ×4 IMPLANT
DRAPE HALF SHEET 40X57 (DRAPES) IMPLANT
ELECT REM PT RETURN 9FT ADLT (ELECTROSURGICAL) ×4
ELECTRODE REM PT RTRN 9FT ADLT (ELECTROSURGICAL) ×2 IMPLANT
EVACUATOR SILICONE 100CC (DRAIN) IMPLANT
GAUZE 4X4 16PLY RFD (DISPOSABLE) ×8 IMPLANT
GLOVE BIOGEL PI IND STRL 7.5 (GLOVE) ×4 IMPLANT
GLOVE BIOGEL PI IND STRL 8 (GLOVE) ×8 IMPLANT
GLOVE BIOGEL PI INDICATOR 7.5 (GLOVE) ×4
GLOVE BIOGEL PI INDICATOR 8 (GLOVE) ×8
GLOVE SKINSENSE NS SZ7.0 (GLOVE) ×2
GLOVE SKINSENSE STRL SZ7.0 (GLOVE) ×2 IMPLANT
GLOVE SURG SS PI 6.5 STRL IVOR (GLOVE) ×8 IMPLANT
GLOVE SURG SYN 7.5  E (GLOVE) ×28
GLOVE SURG SYN 7.5 E (GLOVE) ×14 IMPLANT
GLOVE SURG SYN 8.0 (GLOVE) ×8 IMPLANT
GOWN STRL REUS W/ TWL LRG LVL3 (GOWN DISPOSABLE) ×4 IMPLANT
GOWN STRL REUS W/ TWL XL LVL3 (GOWN DISPOSABLE) ×4 IMPLANT
GOWN STRL REUS W/TWL LRG LVL3 (GOWN DISPOSABLE) ×8
GOWN STRL REUS W/TWL XL LVL3 (GOWN DISPOSABLE) ×8
GRAFT PROPATEN W/RING 6X80X60 (Vascular Products) ×4 IMPLANT
GUIDEWIRE ANGLED .035X260CM (WIRE) ×4 IMPLANT
HEMOSTAT HEMOBLAST BELLOWS (HEMOSTASIS) ×8 IMPLANT
HEMOSTAT SNOW SURGICEL 2X4 (HEMOSTASIS) ×16 IMPLANT
HEMOSTAT SPONGE AVITENE ULTRA (HEMOSTASIS) IMPLANT
HEMOSTAT SURGICEL 2X14 (HEMOSTASIS) ×4 IMPLANT
INSERT FOGARTY SM (MISCELLANEOUS) IMPLANT
KIT BASIN OR (CUSTOM PROCEDURE TRAY) ×4 IMPLANT
KIT ENCORE 26 ADVANTAGE (KITS) ×4 IMPLANT
KIT TURNOVER KIT B (KITS) ×4 IMPLANT
LOOP VESSEL MINI RED (MISCELLANEOUS) ×8 IMPLANT
NS IRRIG 1000ML POUR BTL (IV SOLUTION) ×8 IMPLANT
PACK PERIPHERAL VASCULAR (CUSTOM PROCEDURE TRAY) ×4 IMPLANT
PAD ARMBOARD 7.5X6 YLW CONV (MISCELLANEOUS) ×8 IMPLANT
PATCH VASC XENOSURE 1CMX6CM (Vascular Products) ×4 IMPLANT
PATCH VASC XENOSURE 1X6 (Vascular Products) ×2 IMPLANT
PENCIL BUTTON HOLSTER BLD 10FT (ELECTRODE) ×4 IMPLANT
SET MICROPUNCTURE 5F STIFF (MISCELLANEOUS) ×4 IMPLANT
SHEATH BRITE TIP 7FR 35CM (SHEATH) ×4 IMPLANT
SHEATH BRITE TIP 7FRX11 (SHEATH) ×4 IMPLANT
SPONGE LAP 18X18 RF (DISPOSABLE) ×4 IMPLANT
STENT VIABAHNBX 8X29X135 (Permanent Stent) ×4 IMPLANT
STOPCOCK 4 WAY LG BORE MALE ST (IV SETS) ×4 IMPLANT
SUT ETHILON 3 0 PS 1 (SUTURE) IMPLANT
SUT GORETEX 5 0 TT13 24 (SUTURE) IMPLANT
SUT GORETEX 6.0 TT13 (SUTURE) IMPLANT
SUT MNCRL AB 4-0 PS2 18 (SUTURE) ×16 IMPLANT
SUT PROLENE 5 0 C 1 24 (SUTURE) ×44 IMPLANT
SUT PROLENE 6 0 BV (SUTURE) ×20 IMPLANT
SUT PROLENE 7 0 BV 1 (SUTURE) IMPLANT
SUT SILK 2 0 PERMA HAND 18 BK (SUTURE) IMPLANT
SUT SILK 2 0 SH (SUTURE) ×4 IMPLANT
SUT SILK 3 0 (SUTURE)
SUT SILK 3-0 18XBRD TIE 12 (SUTURE) IMPLANT
SUT VIC AB 2-0 CT1 27 (SUTURE) ×8
SUT VIC AB 2-0 CT1 TAPERPNT 27 (SUTURE) ×4 IMPLANT
SUT VIC AB 3-0 SH 27 (SUTURE) ×16
SUT VIC AB 3-0 SH 27X BRD (SUTURE) ×8 IMPLANT
SYR 10ML LL (SYRINGE) ×4 IMPLANT
SYR 20ML LL LF (SYRINGE) ×4 IMPLANT
TAPE UMBILICAL COTTON 1/8X30 (MISCELLANEOUS) IMPLANT
TOWEL GREEN STERILE (TOWEL DISPOSABLE) ×4 IMPLANT
TRAY FOL W/BAG SLVR 16FR STRL (SET/KITS/TRAYS/PACK) ×2 IMPLANT
TRAY FOLEY W/BAG SLVR 16FR LF (SET/KITS/TRAYS/PACK) ×4
TUBING EXTENTION W/L.L. (IV SETS) ×4 IMPLANT
UNDERPAD 30X36 HEAVY ABSORB (UNDERPADS AND DIAPERS) ×4 IMPLANT
WATER STERILE IRR 1000ML POUR (IV SOLUTION) ×4 IMPLANT
WIRE STARTER BENTSON 035X150 (WIRE) ×4 IMPLANT

## 2020-03-18 NOTE — Op Note (Signed)
Date: March 18, 2020  Preoperative diagnosis: Critical limb ischemia of the right lower extremity with rest pain  Postoperative diagnosis: Same  Procedure: 1. Exploration of right great saphenous vein 2.  Right femoral endarterectomy with profundoplasty and bovine pericardial patch angioplasty 3.  Right iliac arteriogram 4.  Right common iliac artery angioplasty with stent placement (8 mm x 29 mm VBX) 5.  Right common femoral to below-knee popliteal artery bypass with 6 mm ringed PTFE tunneled subfascial  Surgeon: Dr. [name], MD  Assistant: Roxy Horseman, PA  Indications: Patient is a 69 year old female who previously underwent left femoral to above-knee popliteal bypass.  Subsequently recently performed left iliac stenting for rest pain in the left leg and also evaluated right leg given rest pain in right leg.  She had a flush SFA occlusion on the right.  She presents for planned right femoral to below-knee popliteal bypass after risks benefits discussed.  Findings: Vein mapping showed that her saphenous vein was small at the knee.  I did evaluate this with ultrasound and it looked borderline usable and I made two skip incisions on the upper thigh to explore it, but it became apparent that the vein was not going to be usable due to a branch point in the distal thigh.  Ultimately when I cut down on the right common femoral artery it became apparent that she needed a femoral endarterectomy with profundoplasty given circumferential calcium.  I performed endarterectomy with profundoplasty and patched this with bovine pericardium.  I was not satisfied with the femoral pulse and elected to shoot a retrograde iliac arteriogram and placed a right common iliac artery stent with an 8 mm x 29 mm VBX with a better pulse in the right groin.  Right common femoral to below-knee popliteal bypass with PTFE was then performed.  She has a very brisk dorsalis pedis signal at completion in the  right foot.   Anesthesia: General  Details: Patient was taken to the operating room after informed consent was obtained.  Placed on the operative table in supine position.  Right groin and right leg and foot were then prepped and draped in usual sterile fashion.  Preop timeout was performed to identify patient, procedure and site.  Initially made a horizontal groin incision over her right femoral pulse.  Dissected down with Bovie cautery and got circumferential control of the distal external iliac as well as the common femoral SFA and profunda and all these were controlled with Vesseloops.  We did have to remove the mynx closure device from the anterior wall of the artery after recent arteriogram and a 5-0 Prolene in the common femoral was placed to repair this.  I then went down and made several skip incisions on the medial thigh where I marked her vein prior to the leg being prepped and draped.  The proximal to midportion of the saphenous vein in the right thigh was very robust but then it branching became very small and I decided that it was not usable.  I then elected to use 6 mm ringed propatent.  I then went down and exposed the below-knee popliteal artery through a medial incision one fingerbreadth posterior to the tibia and got into the popliteal space.  I dissected the artery away from the vein and controlled with small vessel loops.  I then tunneled from the popliteal space up to the common femoral subsartorial with a Gore tunneler and we then passed a 6 mm ringed propatent graft ensuring that  it did not twist.  Once this was tunneled I went to the common femoral and became apparent that she was going to require common femoral endarterectomy given circumferential calcium in the artery.  The patient was given 100 units per kg heparin IV.   I subsequently clamped in the distal external iliac with a Henley clamp and then the profunda and SFA were controlled with Vesseloops.  I extended the arteriotomy  from the previous puncture site with 11 blade scalpel and ultimately endarterectomy was performed with Wayna Chalet I was able to retrieve a large segment of plaque proximally in the distal external iliac by releasing the clamp temporarily.  I carried this on to the profunda and profundoplasty was performed.  Bovine pericardial patch was then brought on the field and sewn using a 5-0 Prolene in parachute technique.  I de-aired everything prior to completion.  Once I felt the femoral pulse I was not satisfied with the pulse.  I then elected to perform right iliac arteriogram and micropuncture needle was placed in the patch and placed a microwire and then a micro sheath and a retrograde injection was performed.  Further evaluation of the images she had a moderate common iliac stenosis on the right and already had a left common iliac stent.  I then placed a long 7 [place] sheath and had to exchange for a long Glidewire and selected a 8 mm x 29 mm VBX that was deployed in the right common iliac at the bifurcation.  Final injection showed no evidence of extravasation with good wall apposition of the stent.  This femoral pulse was improved once I removed the sheath the arteriotomy in the sheath was repaired with a 5-0 Prolene.  That point time I then had already tunnel my graft and we again controlled the patch in the right groin this was opened with 11 blade scalpel extended Pott scissors and the graft was beveled and a end-to-side anastomosis sewn to the right common femoral patch with 5-0 Prolene parachute technique.  We had good pulsatile flow down the graft.  I then went to straighten the leg cut the graft to the appropriate length opened the below-knee popliteal artery with 11 blade scalpel extended with Potts scissors and an end-to-side anastomosis was sewn to the below-knee popliteal artery once the graft was beveled with a 6-0 Prolene in parachute technique.  We had significant delay getting hemostasis given  significant bleeding from the holes in the graft.  That point time we checked the foot and had good signal and she was given protamine for reversal.  We used multitude of Surgicel, Surgicel snow and hemoblast to get hemostasis.  Once we had good hemostasis the saphenectomy incisions that we made although we did not harvest vein were closed with closure of 3-0 Vicryl 4-0 Monocryl in the the skin.  The groin was closed with 2-0 Vicryl, 3-0 Vicryl, 4-0 Monocryl in the skin and Dermabond.  The below-knee popliteal incision was closed with 2-0 Vicryl, 3-0 Vicryl, 4-0 Monocryl and skin.  Should be taken to PACU in stable condition.  She is going to get two units of blood at completion of the case.  Complication: None  Condition: Stable  [name], MD [facility] [place] Office: [place]

## 2020-03-18 NOTE — H&P (Signed)
History and Physical Interval Note:  09/22/1094 0:45 AM  Marie Johnson  has presented today for surgery, with the diagnosis of PERIPHERAL ARTERY DISEASE, BLEEDING VARICOSE VEINS.  The various methods of treatment have been discussed with the patient and family. After consideration of risks, benefits and other options for treatment, the patient has consented to  Procedure(s): BYPASS GRAFT FEMORAL-POPLITEAL ARTERY (Right) as a surgical intervention.  The patient's history has been reviewed, patient examined, no change in status, stable for surgery.  I have reviewed the patient's chart and labs.  Questions were answered to the patient's satisfaction.    Right common femoral to below-knee popliteal bypass for critical limb ischemia.  Vein is small at the knee and may require prosthetic as discussed with patient.  Marie Johnson  Reason for Consult:  Bleeding varicosity Requesting Physician:  Johnney Killian MRN #:  [id number]  History of Present Illness: This is a 69 y.o. female who is known to our office for PAD with hx of previous left femoral to popliteal artery bypass grafting by Dr. Donnetta Hutching about 15-20 years ago.  She has also been seen by Dr. Donnetta Hutching for leg swelling in September 2020.  At the time of that visit, she was noted to have good blood flow.    Pt states she was shaving her legs this morning and knicked one of her varicose veins.  She states that they tried to put pressure on this, but it continued to bleed.  She states that her husband wrapped it as tight as he could with what sounds like an ace bandage but continued to bleed so EMS was called.  A tourniquet was placed on her leg by EMS and it continued to bleed.  The tourniquet was reported to be in place ~ 40 minutes.  She states these veins have not ever bled before.  She states that her leg does feel a little funny but motor and sensation are in tact.    While checking her foot for doppler signals, she cannot tolerate her leg being up  on the bed.  She tells me that she pushes her husband in the wheel chair down the ramp and she has to stop bc she gets cramps in her calves with left worse than right.  She states that at night, she cannot tolerate her legs up and has to sleep in her recliner so that her feet will hang over the bed.  She states standing also helps with the pain.  She does not have any non healing wounds on either feet.   She had ABI's in February 2020, which revealed 0.63 with biphasic waveform on the right and 0.89 with monophasic (PT) and biphasic (DP) on the left.   +-------+-----------+-----------+------------+------------+  ABI/TBIToday's ABIToday's TBIPrevious ABIPrevious TBI  +-------+-----------+-----------+------------+------------+  Right 0.63    0.44    0.88    0.51      +-------+-----------+-----------+------------+------------+  Left  0.89    0.98    1.09    0.62      +-------+-----------+-----------+------------+------------+   She does have swelling in her legs with left greater than right.  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    She states she has hx of CAD, but has not had an MI or any chest pain.  She has NTG, but has not had to take any.  She states she does have COPD and asthma.  She has remote hx of stomach ulcer.  She denies any  melena or hematochezia or hematuria.    The pt is on a statin for cholesterol management.  The pt is on a daily aspirin.   Other AC:  none The pt is on CCB for hypertension.   The pt is not diabetic.   Tobacco hx:  current  PMH: 1.  PAD with hx of left femoral to popliteal bypass grafting 2.  CAD 3.  COPD/asthma 4.  GERD 5.  Hx heart failure 6.  Hypertension   Medications: albuterol (PROVENTIL HFA;VENTOLIN HFA) 108 (90 BASE) MCG/ACT inhaler alendronate (FOSAMAX) 70 MG tablet allopurinol (ZYLOPRIM) 300 MG tablet aspirin EC 81 MG tablet atorvastatin (LIPITOR) 80 MG  tablet EPIPEN 2-PAK 0.3 MG/0.3ML SOAJ injection ezetimibe (ZETIA) 10 MG tablet furosemide (LASIX) 40 MG tablet KLOR-CON M20 20 MEQ tablet Menthol-Methyl Salicylate (MUSCLE RUB) 10-15 % CREA nitroGLYCERIN (NITROSTAT) 0.4 MG SL tablet Oxycodone HCl 10 MG TABS pantoprazole (PROTONIX) 40 MG tablet spironolactone (ALDACTONE) 25 MG tablet verapamil (CALAN-SR) 240 MG CR tablet   Allergies: Bee Venom  Latex  Social Hx: Married Current smoker  Family Hx: Mother and father with hx of heart disease; mother with hx of MI in 80's.  Both parents deceased. Multiple relatives with hx of cancer   ROS: [x]  Positive   [ ]  Negative   [ ]  All sytems reviewed and are negative  Cardiac: []  chest pain/pressure []  palpitations []  SOB lying flat [x]  DOE  Vascular: [x]  pain in legs while walking [x]  pain in legs at rest [x]  pain in legs at night [x]  bleeding varicosity []  non-healing ulcers []  hx of DVT [x]  swelling in legs [x]  varicose veins  Pulmonary: []  productive cough []  asthma/wheezing []  home O2  Neurologic: []  weakness in []  arms []  legs []  numbness in []  arms []  legs []  hx of CVA []  mini stroke [] difficulty speaking or slurred speech []  temporary loss of vision in one eye [x]  dizziness  Hematologic: [x]  bleeding problems   Endocrine:   []  diabetes []  thyroid disease  GI []  blood in stool  GU: []  CKD/renal failure []  HD--[]  M/W/F or []  T/T/S []  blood in urine  Psychiatric: []  anxiety []  depression  Musculoskeletal: []  arthritis []  joint pain  Integumentary: []  rashes []  ulcers  Constitutional: []  fever []  chills   Physical Examination      Vitals:   03/08/20 1213 03/08/20 1215  BP: 117/72 123/66  Pulse: 82 82  Resp: 20 18  Temp:    SpO2: 99% 96%   Body mass index is 19.77 kg/m.  General:  WDWN in NAD Gait: Not observed HENT: WNL, normocephalic Pulmonary: normal non-labored breathing, without Rales, rhonchi,   wheezing Cardiac: regular without carotid bruits Abdomen:  soft, NT/ND, no masses Skin: without rashes Vascular Exam/Pulses:  Right Left  Radial 2+ (normal) 2+ (normal)  Femoral 2+ (normal) 2+ (normal)  DP Faint monophasic Faint monophasic  PT absent absent  Peroneal absent absent   Extremities: superficial varicosities BLE;  Dressing removed from LLE and small area of bleeding continues-mild compression dressing replaced around ankle.  Anterior and lateral compartments are soft on the left; calf is also soft; motor and sensory are in tact;  Well healed scars on left hip. Musculoskeletal: no muscle wasting or atrophy       Neurologic: A&O X 3;  No focal weakness or paresthesias are detected; speech is fluent/normal Psychiatric:  The pt has Normal affect.   CBC Labs (Brief)          Component Value  Date/Time   WBC 7.1 03/08/2020 0920   RBC 3.70 (L) 03/08/2020 0920   HGB 11.0 (L) 03/08/2020 0920   HCT 34.4 (L) 03/08/2020 0920   PLT 266 03/08/2020 0920   MCV 93.0 03/08/2020 0920   MCH 29.7 03/08/2020 0920   MCHC 32.0 03/08/2020 0920   RDW 23.0 (H) 03/08/2020 0920      BMET Labs (Brief)          Component Value Date/Time   NA 138 03/08/2020 1300   K 3.5 03/08/2020 1300   CL 106 03/08/2020 1300   CO2 20 (L) 03/08/2020 1300   GLUCOSE 113 (H) 03/08/2020 1300   BUN 16 03/08/2020 1300   CREATININE 0.87 03/08/2020 1300   CALCIUM 8.7 (L) 03/08/2020 1300   GFRNONAA >60 03/08/2020 1300   GFRAA >60 03/08/2020 1300      COAGS: Recent Labs       Lab Results  Component Value Date   INR 1.1 03/08/2020       Non-Invasive Vascular Imaging:   +-----------+--------+-----+--------+----------+--------+  LEFT    PSV cm/sRatioStenosisWaveform Comments  +-----------+--------+-----+--------+----------+--------+  CFA Distal            monophasic      +-----------+--------+-----+--------+----------+--------+  POP  Mid             monophasic      +-----------+--------+-----+--------+----------+--------+  ATA Prox             monophasic      +-----------+--------+-----+--------+----------+--------+  ATA Mid             monophasic      +-----------+--------+-----+--------+----------+--------+  ATA Distal            monophasic      +-----------+--------+-----+--------+----------+--------+  PTA Prox             monophasic      +-----------+--------+-----+--------+----------+--------+  PTA Mid             monophasic      +-----------+--------+-----+--------+----------+--------+  PTA Distal            monophasic      +-----------+--------+-----+--------+----------+--------+  PERO Prox            monophasic      +-----------+--------+-----+--------+----------+--------+  PERO Mid             monophasic      +-----------+--------+-----+--------+----------+--------+  PERO Distal           monophasic      +-----------+--------+-----+--------+----------+--------+  DP                monophasic      +-----------+--------+-----+--------+----------+--------+     Left Graft #1:  +--------------------+--------+--------+----------+--------+            PSV cm/sStenosisWaveform Comments  +--------------------+--------+--------+----------+--------+  Inflow                          +--------------------+--------+--------+----------+--------+  Proximal Anastomosis                   +--------------------+--------+--------+----------+--------+  Proximal Graft           monophasic      +--------------------+--------+--------+----------+--------+  Mid Graft               monophasic      +--------------------+--------+--------+----------+--------+  Distal Graft            monophasic      +--------------------+--------+--------+----------+--------+  Distal Anastomosis                    +--------------------+--------+--------+----------+--------+  Outflow                          +--------------------+--------+--------+----------+--------+   Summary:  Left: Post below the knee tourniquet removal, arterial flow is detected in  the calf arteries.  Incidental findings of monophasic flow throughout arterial system  including femoral to popliteal bypass graft.     ASSESSMENT/PLAN: This is a 69 y.o. female who came to hospital with bleeding varicosity with hx of PAD and left femoral to above knee popliteal bypass by Dr. Donnetta Hutching ~ 15-20 years ago now with rest pain bilaterally with left>right  PAD with rest pain -will admit to hospital for arteriogram tomorrow with BLE runoff by Dr. Carlis Abbott. Her arterial duplex today reveals bypass graft is patent, but with monophasic flow throughout on the left. She had faint doppler signals present bilateral DP.  We will obtain vein mapping in case she needs possible bypass -Will hold on anticoagulation given bleeding varicosity.  -npo after MN/consent -discussed with pt the importance of smoking cessation  -hx of CAD - followed by [facility]  Bleeding varicosity left lower leg -continue with mild compression.  If she does develop further bleeding from this, elevate leg as tolerated and use one finger compression to control.  I also discussed this with pt in case it happens again.  -pt had tourniquet on her leg for reported 40 minutes.  Doubt she is at risk for compartment syndrome, but will monitor for this.   Leontine Locket, PA-C [facility] [phone]  I have independently interviewed and examined patient and  agree with PA assessment and plan above.   Brandon C. Donzetta Matters, MD [facility] [place] Office: [phone] Pager: [phone]

## 2020-03-18 NOTE — Anesthesia Postprocedure Evaluation (Signed)
Anesthesia Post Note  Patient: Marie Johnson  Procedure(s) Performed: BYPASS GRAFT FEMORAL-POPLITEAL ARTERY using GORE PROPATEN VASCULAR GRAFT (Right ) PATCH ANGIOPLASTY using a XENOSURE BIOLOGIC PATCH OF THE PROFUNDA (Right Groin) ENDARTERECTOMY FEMORAL WITH PROFUNDAPLASTY (Right Groin) INSERTION OF GORE VIABAHN  ILIAC STENT (Right Groin) RIGHT ILIAC ARTERIOGRAM WITH AN ILIAC STENT (Right Groin)     Patient location during evaluation: PACU Anesthesia Type: General Level of consciousness: sedated and patient cooperative Pain management: pain level controlled Vital Signs Assessment: post-procedure vital signs reviewed and stable Respiratory status: spontaneous breathing Cardiovascular status: stable Anesthetic complications: no   No complications documented.  Last Vitals:  Vitals:   03/18/20 1422 03/18/20 1522  BP: 108/66 130/67  Pulse: 89 96  Resp: (!) 22 20  Temp:  36.6 C  SpO2: 99% 97%    Last Pain:  Vitals:   03/18/20 1522  TempSrc:   PainSc: [place]

## 2020-03-18 NOTE — Anesthesia Procedure Notes (Signed)
Procedure Name: Intubation Date/Time: 03/18/2020 7:39 AM Performed by: Mariea Clonts, CRNA Pre-anesthesia Checklist: Patient identified, Emergency Drugs available, Suction available and Patient being monitored Patient Re-evaluated:Patient Re-evaluated prior to induction Oxygen Delivery Method: Circle System Utilized Preoxygenation: Pre-oxygenation with 100% oxygen Induction Type: IV induction Ventilation: Mask ventilation without difficulty Laryngoscope Size: Miller and 2 Grade View: Grade I Tube type: Oral Tube size: 7.0 mm Number of attempts: 1 Airway Equipment and Method: Stylet and Oral airway Placement Confirmation: ETT inserted through vocal cords under direct vision,  positive ETCO2 and breath sounds checked- equal and bilateral Tube secured with: Tape Dental Injury: Teeth and Oropharynx as per pre-operative assessment

## 2020-03-18 NOTE — Transfer of Care (Signed)
Immediate Anesthesia Transfer of Care Note  Patient: EUDORA GUEVARRA  Procedure(s) Performed: BYPASS GRAFT FEMORAL-POPLITEAL ARTERY using GORE PROPATEN VASCULAR GRAFT (Right ) PATCH ANGIOPLASTY using a XENOSURE BIOLOGIC PATCH OF THE PROFUNDA (Right Groin) ENDARTERECTOMY FEMORAL WITH PROFUNDAPLASTY (Right Groin) INSERTION OF GORE VIABAHN  ILIAC STENT (Right Groin) RIGHT ILIAC ARTERIOGRAM WITH AN ILIAC STENT (Right Groin)  Patient Location: PACU  Anesthesia Type:General  Level of Consciousness: awake, alert  and oriented  Airway & Oxygen Therapy: Patient Spontanous Breathing and Patient connected to face mask oxygen  Post-op Assessment: Report given to RN, Post -op Vital signs reviewed and stable and Patient moving all extremities X 4  Post vital signs: Reviewed and stable  Last Vitals:  Vitals Value Taken Time  BP 148/68 03/18/20 1207  Temp    Pulse 91 03/18/20 1211  Resp 21 03/18/20 1211  SpO2 100 % 03/18/20 1211  Vitals shown include unvalidated device data.  Last Pain:  Vitals:   03/18/20 5329  TempSrc:   PainSc: 8          Complications: No complications documented.

## 2020-03-19 LAB — BASIC METABOLIC PANEL
Anion gap: 8 (ref 5–15)
BUN: 20 mg/dL (ref 8–23)
CO2: 25 mmol/L (ref 22–32)
Calcium: 8.1 mg/dL — ABNORMAL LOW (ref 8.9–10.3)
Chloride: 102 mmol/L (ref 98–111)
Creatinine, Ser: 1.07 mg/dL — ABNORMAL HIGH (ref 0.44–1.00)
GFR calc Af Amer: >60 mL/min (ref >60)
GFR calc non Af Amer: 53 mL/min — ABNORMAL LOW (ref >60)
Glucose, Bld: 140 mg/dL — ABNORMAL HIGH (ref 70–99)
Potassium: 3.9 mmol/L (ref 3.5–5.1)
Sodium: 135 mmol/L (ref 135–145)

## 2020-03-19 LAB — CBC
HCT: 20.7 % — ABNORMAL LOW (ref 36.0–46.0)
Hemoglobin: 6.7 g/dL — CL (ref 12.0–15.0)
MCH: 29.6 pg (ref 26.0–34.0)
MCHC: 32.4 g/dL (ref 30.0–36.0)
MCV: 91.6 fL (ref 80.0–100.0)
Platelets: 187 10*3/uL (ref 150–400)
RBC: 2.26 MIL/uL — ABNORMAL LOW (ref 3.87–5.11)
RDW: 19.9 % — ABNORMAL HIGH (ref 11.5–15.5)
WBC: 6.9 10*3/uL (ref 4.0–10.5)
nRBC: 0.4 % — ABNORMAL HIGH (ref 0.0–0.2)

## 2020-03-19 LAB — CBC WITH DIFFERENTIAL/PLATELET
Abs Immature Granulocytes: 0.12 10*3/uL — ABNORMAL HIGH (ref 0.00–0.07)
Basophils Absolute: 0 10*3/uL (ref 0.0–0.1)
Basophils Relative: 0 %
Eosinophils Absolute: 0 10*3/uL (ref 0.0–0.5)
Eosinophils Relative: 0 %
HCT: 31 % — ABNORMAL LOW (ref 36.0–46.0)
Hemoglobin: 10.2 g/dL — ABNORMAL LOW (ref 12.0–15.0)
Immature Granulocytes: 1 %
Lymphocytes Relative: 17 %
Lymphs Abs: 1.7 10*3/uL (ref 0.7–4.0)
MCH: 29.5 pg (ref 26.0–34.0)
MCHC: 32.9 g/dL (ref 30.0–36.0)
MCV: 89.6 fL (ref 80.0–100.0)
Monocytes Absolute: 0.9 10*3/uL (ref 0.1–1.0)
Monocytes Relative: 9 %
Neutro Abs: 7.1 10*3/uL (ref 1.7–7.7)
Neutrophils Relative %: 73 %
Platelets: 210 10*3/uL (ref 150–400)
RBC: 3.46 MIL/uL — ABNORMAL LOW (ref 3.87–5.11)
RDW: 17.6 % — ABNORMAL HIGH (ref 11.5–15.5)
WBC: 9.9 10*3/uL (ref 4.0–10.5)
nRBC: 0.5 % — ABNORMAL HIGH (ref 0.0–0.2)

## 2020-03-19 LAB — PREPARE RBC (CROSSMATCH)

## 2020-03-19 LAB — LIPID PANEL
Cholesterol: 92 mg/dL (ref 0–200)
HDL: 29 mg/dL — ABNORMAL LOW (ref >40)
LDL Cholesterol: 41 mg/dL (ref 0–99)
Total CHOL/HDL Ratio: 3.2 RATIO
Triglycerides: 111 mg/dL (ref <150)
VLDL: 22 mg/dL (ref 0–40)

## 2020-03-19 MED ORDER — SODIUM CHLORIDE 0.9% IV SOLUTION: Freq: Once | INTRAVENOUS | Status: AC

## 2020-03-19 MED ORDER — OXYCODONE HCL 5 MG PO TABS
10.0000 mg | ORAL_TABLET | ORAL | Status: DC | PRN
  Administered 2020-03-19 – 2020-03-22 (×14): 10 mg via ORAL
  Filled 2020-03-19 (×14): qty 2

## 2020-03-19 NOTE — Progress Notes (Signed)
Mobility Specialist - Progress Note   03/19/20 1339  Mobility  Activity Stood at bedside  Level of Assistance Modified independent, requires aide device or extra time  Assistive Device Front wheel walker  Mobility Response Tolerated well  Mobility performed by Mobility specialist  $Mobility charge 1 Mobility    Pre-mobility: 100 HR, 98% SpO2 During mobility: 126 HR Post-mobility: 116 HR, 94% SpO2  Pt did not want to ambulate due to feeling sore from PT and OT earlier. She stood at the bedside and walked in place instead.  Pricilla Handler Mobility Specialist Mobility Specialist Phone: [phone]

## 2020-03-19 NOTE — Evaluation (Signed)
Occupational Therapy Evaluation Patient Details Name: Marie Johnson MRN: [id number] DOB: Jan 12, 1951 Today's Date: 03/19/2020    History of Present Illness Pt is a 69 y/o female now s/p R femoral endarterectomy with retrograde right common iliac stent and common femoral to below-knee pop bypass.  PMHx includes anemia, anxiety, CHF, CAD, PAD, COPD, hx abdominal aortogram 03/09/20, hx hip sx   Clinical Impression   This 69 y/o female presents with the above. Pt very pleasant and willing to participate in therapy session. Pt mostly with limitations due to RLE pain at this time, impacting her functional performance and requiring increase time for ADL/mobility tasks. Pt performing functional transfers to/from [facility] using RW; after seated rest break pt tolerating additional room level mobility using RW with minA throughout. Pt with heavy reliance on UE support to offweight RLE at this time given pain. She currently requires minA for seated toileting ADL, up to [facility] for LB ADL. Pt to benefit from continued acute OT services, anticipate pt to progress well and reach point of being able to return home, recommend follow up [place] services to progress pt towards her PLOF. Will follow.     Follow Up Recommendations  Home health OT;Supervision/Assistance - 24 hour (pending progress )    Equipment Recommendations  None recommended by OT           Precautions / Restrictions Precautions Precautions: Fall Restrictions Weight Bearing Restrictions: No      Mobility Bed Mobility Overal bed mobility: Needs Assistance Bed Mobility: Supine to Sit     Supine to sit: Min guard     General bed mobility comments: for lines, safety, increased time/effort due to pain   Transfers Overall transfer level: Needs assistance Equipment used: Rolling walker (2 wheeled);1 person hand held assist Transfers: Sit to/from Stand Sit to Stand: Min assist         General transfer comment: assist for balance/safety,  increased time for transition of UEs to RW    Balance Overall balance assessment: Needs assistance Sitting-balance support: Feet supported Sitting balance-Leahy Scale: Good     Standing balance support: Bilateral upper extremity supported;During functional activity Standing balance-Leahy Scale: Fair Standing balance comment: pt able to maintain static balance with minguard for balance; heavy reliance on UE support during mobility to offweight RLE                            ADL either performed or assessed with clinical judgement   ADL Overall ADL's : Needs assistance/impaired Eating/Feeding: Modified independent;Sitting   Grooming: Set up;Sitting   Upper Body Bathing: Supervision/ safety;Sitting   Lower Body Bathing: Moderate assistance;Sitting/lateral leans;Sit to/from stand   Upper Body Dressing : Set up;Sitting   Lower Body Dressing: Moderate assistance;Sitting/lateral leans;Sit to/from stand Lower Body Dressing Details (indicate cue type and reason): due to LE pain Toilet Transfer: Minimal assistance;Stand-pivot;Ambulation;BSC;RW Toilet Transfer Details (indicate cue type and reason): initially performed stand pivot via HHA to BSC, use of RW back to EOB; pt able to ambulate around EOB to recliner end of session using RW with minA Toileting- Clothing Manipulation and Hygiene: Minimal assistance;Sitting/lateral lean;Sit to/from stand Toileting - Clothing Manipulation Details (indicate cue type and reason): pt able to perform pericare with minA for standing balance after voiding bladder      Functional mobility during ADLs: Minimal assistance;Rolling walker General ADL Comments: pt with limitations mostly due to RLE pain at this time  Pertinent Vitals/Pain Pain Assessment: Faces Faces Pain Scale: Hurts whole lot Pain Location: RLE Pain Descriptors / Indicators: Discomfort;Grimacing;Guarding;Sore Pain Intervention(s): Limited  activity within patient's tolerance;Monitored during session;Premedicated before session;Repositioned     Hand Dominance     Extremity/Trunk Assessment Upper Extremity Assessment Upper Extremity Assessment: Overall WFL for tasks assessed   Lower Extremity Assessment Lower Extremity Assessment: Defer to PT evaluation       Communication Communication Communication: No difficulties   Cognition Arousal/Alertness: Awake/alert Behavior During Therapy: WFL for tasks assessed/performed Overall Cognitive Status: Impaired/Different from baseline Area of Impairment: Memory                     Memory: Decreased short-term memory         General Comments: pt endorses not feeling quite at her baseline, reports decreasd STM   General Comments       Exercises     Shoulder Instructions      Home Living Family/patient expects to be discharged to:: Private residence Living Arrangements: Spouse/significant other Available Help at Discharge: Family Type of Home: Mobile home Home Access: Ramped entrance     Home Layout: One level     Bathroom Shower/Tub: Tub/shower unit (son working on putting in Gaffer)   Biochemist, clinical: [place]: Wheelchair - Education officer, community - power;Shower seat;Bedside commode   Additional Comments: w/c's belong to spouse - spouse recently with amputation       Prior Functioning/Environment Level of Independence: Independent        Comments: caring for spouse who recently had amputation, reports some dyspnea with prolonged activity        OT Problem List: Decreased strength;Decreased range of motion;Decreased activity tolerance;Impaired balance (sitting and/or standing);Decreased knowledge of use of DME or AE;Decreased knowledge of precautions;Pain      OT Treatment/Interventions: Self-care/ADL training;Therapeutic exercise;Energy conservation;DME and/or AE instruction;Therapeutic activities;Patient/family  education;Balance training    OT Goals(Current goals can be found in the care plan section) Acute Rehab OT Goals Patient Stated Goal: less pain, maintain her independence  OT Goal Formulation: With patient Time For Goal Achievement: 04/02/20 Potential to Achieve Goals: Good  OT Frequency: Min 2X/week   Barriers to D/C:            Co-evaluation              AM-PAC OT "6 Clicks" Daily Activity     Outcome Measure Help from another person eating meals?: None Help from another person taking care of personal grooming?: A Little Help from another person toileting, which includes using toliet, bedpan, or urinal?: A Little Help from another person bathing (including washing, rinsing, drying)?: A Lot Help from another person to put on and taking off regular upper body clothing?: A Little Help from another person to put on and taking off regular lower body clothing?: A Lot 6 Click Score: 17   End of Session Equipment Utilized During Treatment: Rolling walker Nurse Communication: Mobility status  Activity Tolerance: Patient tolerated treatment well Patient left: in chair;with call bell/phone within reach;with chair alarm set  OT Visit Diagnosis: Other abnormalities of gait and mobility (R26.89);Pain Pain - Right/Left: Right Pain - part of body: Leg                Time: 0086-7619 OT Time Calculation (min): 41 min Charges:  OT General Charges $OT Visit: 1 Visit OT Evaluation $OT Eval Moderate Complexity: 1 Mod OT Treatments $Self Care/Home Management : 23-37 mins  Lou Cal, OT Acute Rehabilitation Services Pager [phone] Office [place] 03/19/2020, 12:00 PM

## 2020-03-19 NOTE — Progress Notes (Signed)
PHARMACIST LIPID MONITORING   Marie Johnson is a 69 y.o. female admitted on 03/18/2020 with critical limb ischemia, now s/p R femoral endarterectomy and common femoral to below-knee popliteal bypass.  Pharmacy has been consulted to optimize lipid-lowering therapy with the indication of secondary prevention for clinical ASCVD.  Recent Labs:  Lipid Panel (last 6 months):   Lab Results  Component Value Date   CHOL 92 03/19/2020   TRIG 111 03/19/2020   HDL 29 (L) 03/19/2020   CHOLHDL 3.2 03/19/2020   VLDL 22 03/19/2020   LDLCALC 41 03/19/2020    Hepatic function panel (last 6 months):   Lab Results  Component Value Date   AST 20 03/15/2020   ALT 17 03/15/2020   ALKPHOS 70 03/15/2020   BILITOT 0.7 03/15/2020    SCr (since admission):   Serum creatinine: 1.07 mg/dL (H) 03/18/20 2359 Estimated creatinine clearance: 43 mL/min (A)  Current lipid-lowering therapy: atorvastatin 80mg  daily, ezetimibe 10mg  daily (both restarted during this admission)  Was not taking any lipid-lowering therapy PTA Documented or reported allergies or intolerances to lipid-lowering therapies (if applicable): none  Assessment: LDL is well-controlled even though patient was not taking any lipid-lowering medication PTA. Given risk factors, would continue current lipid-lowering therapy started during this admission.  Recommendation per protocol:  Continue current lipid-lowering therapy. - atorvastatin 80mg  daily and ezetimibe 10mg  daily  Follow-up with:  Cardiology provider - Ena Dawley, MD  Follow-up labs after discharge:    Liver function panel and lipid panel in 8-12 weeks then annually  Richardine Service, PharmD PGY2 Cardiology Pharmacy Resident Phone: [phone] 03/19/2020  2:13 PM  Please check [email or web address] for unit-specific pharmacy phone numbers.

## 2020-03-19 NOTE — Evaluation (Signed)
Physical Therapy Evaluation Patient Details Name: Marie Johnson MRN: [id number] DOB: 10-27-1950 Today's Date: 03/19/2020   History of Present Illness  Pt is a 69 y/o female now s/p R femoral endarterectomy with retrograde right common iliac stent and common femoral to below-knee pop bypass.  PMHx includes anemia, anxiety, CHF, CAD, PAD, COPD, hx abdominal aortogram 03/09/20, hx hip sx    Clinical Impression  Pt admitted with above diagnosis. PTA pt active and independent. She lives at home with her husband who recently had a BKA. On eval, pt required min to min guard assist transfers, and min assist ambulation 25' with RW. Gait distance limited by RLE pain. Pt currently with functional limitations due to the deficits listed below (see PT Problem List). Pt will benefit from skilled PT to increase their independence and safety with mobility to allow discharge to the venue listed below.       Follow Up Recommendations Home health PT;Supervision/Assistance - 24 hour    Equipment Recommendations  None recommended by PT    Recommendations for Other Services       Precautions / Restrictions Precautions Precautions: Fall      Mobility  Bed Mobility               General bed mobility comments: Pt received in recliner.  Transfers Overall transfer level: Needs assistance Equipment used: Rolling walker (2 wheeled);1 person hand held assist Transfers: Sit to/from [facility] Sit to Stand: Min assist Stand pivot transfers: Min guard       General transfer comment: cues for sequencing, assist for balance, increased time  Ambulation/Gait Ambulation/Gait assistance: Min assist Gait Distance (Feet): 25 Feet Assistive device: Rolling walker (2 wheeled) Gait Pattern/deviations: Step-to pattern;Decreased stride length;Antalgic;Decreased weight shift to right Gait velocity: decreased Gait velocity interpretation: <1.8 ft/sec, indicate of risk for recurrent  falls General Gait Details: cues for sequencing, assist for balance and RW management  Stairs            Wheelchair Mobility    Modified Rankin (Stroke Patients Only)       Balance Overall balance assessment: Needs assistance Sitting-balance support: Feet supported;No upper extremity supported Sitting balance-Leahy Scale: Good     Standing balance support: Bilateral upper extremity supported;During functional activity Standing balance-Leahy Scale: Fair Standing balance comment: RW for amb due to RLE pain                             Pertinent Vitals/Pain Pain Assessment: Faces Faces Pain Scale: Hurts whole lot Pain Location: RLE Pain Descriptors / Indicators: Discomfort;Grimacing;Guarding;Sore Pain Intervention(s): Limited activity within patient's tolerance;Monitored during session;Repositioned;Premedicated before session    [place] expects to be discharged to:: Private residence Living Arrangements: Spouse/significant other Available Help at Discharge: Family Type of Home: Mobile home Home Access: Ramped entrance     Home Layout: One level Home Equipment: Wheelchair - manual;Wheelchair - power;Shower seat;Bedside commode;Walker - 2 wheels      Prior Function Level of Independence: Independent         Comments: caring for spouse who recently had amputation, reports some dyspnea with prolonged activity     Hand Dominance        Extremity/Trunk Assessment   Upper Extremity Assessment Upper Extremity Assessment: Defer to OT evaluation    Lower Extremity Assessment Lower Extremity Assessment: RLE deficits/detail RLE: Unable to fully assess due to pain       Communication  Communication: No difficulties  Cognition Arousal/Alertness: Awake/alert Behavior During Therapy: WFL for tasks assessed/performed Overall Cognitive Status: Within Functional Limits for tasks assessed                                         General Comments General comments (skin integrity, edema, etc.): max HR 120 during amb    Exercises General Exercises - Lower Extremity Ankle Circles/Pumps: AROM;Both;10 reps;Seated   Assessment/Plan    PT Assessment Patient needs continued PT services  PT Problem List Decreased mobility;Decreased safety awareness;Decreased activity tolerance;Pain;Decreased balance;Decreased knowledge of use of DME       PT Treatment Interventions DME instruction;Therapeutic activities;Gait training;Therapeutic exercise;Patient/family education;Balance training;Functional mobility training    PT Goals (Current goals can be found in the Care Plan section)  Acute Rehab PT Goals Patient Stated Goal: independence PT Goal Formulation: With patient Time For Goal Achievement: 04/02/20 Potential to Achieve Goals: Good    Frequency Min 3X/week   Barriers to discharge        Co-evaluation               AM-PAC PT "6 Clicks" Mobility  Outcome Measure Help needed turning from your back to your side while in a flat bed without using bedrails?: A Little Help needed moving from lying on your back to sitting on the side of a flat bed without using bedrails?: A Little Help needed moving to and from a bed to a chair (including a wheelchair)?: A Little Help needed standing up from a chair using your arms (e.g., wheelchair or bedside chair)?: A Little Help needed to walk in hospital room?: A Little Help needed climbing 3-5 steps with a railing? : A Lot 6 Click Score: 17    End of Session Equipment Utilized During Treatment: Gait belt Activity Tolerance: Patient tolerated treatment well Patient left: in chair;with call bell/phone within reach;with chair alarm set;with family/visitor present Nurse Communication: Mobility status PT Visit Diagnosis: Pain;Difficulty in walking, not elsewhere classified (R26.2) Pain - Right/Left: Right Pain - part of body: Leg    Time: 1205-1232 PT Time  Calculation (min) (ACUTE ONLY): 27 min   Charges:   PT Evaluation $PT Eval Moderate Complexity: 1 Mod PT Treatments $Gait Training: 8-22 mins        Lorrin Goodell, PT  Office # [phone] Pager [phone]   Lorriane Shire 03/19/2020, 1:10 PM

## 2020-03-19 NOTE — Progress Notes (Signed)
Patient sat on side of bed for about 10 minutes.  Patient up to [facility], sat on [facility] for about 10 minutes, patient passing gas.  Patient with no complaints

## 2020-03-19 NOTE — Progress Notes (Signed)
[facility]  Subjective  -states she has had a lot of pain in her incisions overnight.  Apparently nursing was holding narcotics due to soft blood pressure.   Objective (!) 156/74 93 98.2 F (36.8 C) (Oral) 16 98%  Intake/Output Summary (Last 24 hours) at 03/19/2020 0920 Last data filed at 03/19/2020 0800 Gross per 24 hour  Intake 3785 ml  Output 2680 ml  Net 1105 ml    Right groin as well as right below-knee popliteal incision and medial thigh incisions are clean dry and intact Right AT and DP palpable Right foot warm  Laboratory Lab Results: Recent Labs    03/18/20 1713 03/18/20 2359  WBC 11.2* 6.9  HGB 7.7* 6.7*  HCT 23.9* 20.7*  PLT 229 187   BMET Recent Labs    03/18/20 1713 03/18/20 2359  NA 135 135  K 4.4 3.9  CL 102 102  CO2 25 25  GLUCOSE 160* 140*  BUN 17 20  CREATININE 0.87 1.07*  CALCIUM 8.2* 8.1*    COAG Lab Results  Component Value Date   INR 1.1 03/15/2020   INR 1.1 03/09/2020   INR 1.1 03/08/2020   No results found for: PTT  Assessment/Planning:  Postop day 1 status post right femoral endarterectomy with retrograde right common iliac stent and then common femoral to below-knee pop bypass with PTFE.  She has a palpable pulse in the right foot.  She did get 2 units packed red cells this morning for hemoglobin of 6.7.  We will check posttransfusion CBC.  Blood pressure is much more stable and I think she can get her Dilaudid for breakthrough and have ordered oxycodone 10 mg since this is her home dose up to 4 times daily.  Hopefully we can get her more comfortable.  I think she is doing clinically okay from a surgical standpoint.  [name] 03/19/2020 9:20 AM --

## 2020-03-19 NOTE — Progress Notes (Signed)
RN removed patient foley.  Patient with no complaints.

## 2020-03-20 LAB — BPAM RBC
Blood Product Expiration Date: 202107312359
Blood Product Expiration Date: 202107312359
Blood Product Expiration Date: 202107312359
Blood Product Expiration Date: 202107312359
ISSUE DATE / TIME: 202107021106
ISSUE DATE / TIME: 202107021106
ISSUE DATE / TIME: 202107030143
ISSUE DATE / TIME: 202107030609
Unit Type and Rh: 6200
Unit Type and Rh: 6200
Unit Type and Rh: 6200
Unit Type and Rh: 6200

## 2020-03-20 LAB — TYPE AND SCREEN
ABO/RH(D): A POS
Antibody Screen: NEGATIVE
Unit division: 0
Unit division: 0
Unit division: 0
Unit division: 0

## 2020-03-20 NOTE — Progress Notes (Signed)
Mobility Specialist - Progress Note   03/20/20 1117  Mobility  Activity Transferred to/from [facility]  Level of Assistance Modified independent, requires aide device or extra time  Assistive Device Front wheel walker  Mobility Response Tolerated well  Mobility performed by Mobility specialist  $Mobility charge 1 Mobility    Pre-mobility: 105 HR, 97% SpO2 Post-mobility: 110 HR, 98% SPO2  Pt declined further mobility due to feeling weak and not sleeping much last night.  Marie Johnson Mobility Specialist Mobility Specialist Phone: [phone]

## 2020-03-20 NOTE — Progress Notes (Addendum)
[facility] [place]  Subjective  - Pain seems better today than yesterday.  She did not sleep last night.   Objective (!) 123/55 93 98.7 F (37.1 C) (Oral) 15 93%  Intake/Output Summary (Last 24 hours) at 03/20/2020 0817 Last data filed at 03/19/2020 1900 Gross per 24 hour  Intake 2297.63 ml  Output 1525 ml  Net 772.63 ml    Doppler signals AT/PT Edema in left LE, active range of motion intact toes and ankle. Incisions healing well with soft groin and without hematoma Lungs non labored breathing  Assessment/Planning: POD # 2 Right fem pop bypass with PTFE  Pain control issues will cont. With current regimen. Encouraged mobility Blood loss anemia s/p 4 units, HGB 10.2. stable.   Cr elevated 1.07, UOP 2,052 last 24 hours.  Fluids @ 100 ml/hr currently.   Will decrease to 50. Labs ordered for am to repeat.     Roxy Horseman 03/20/2020 8:17 AM --  Laboratory Lab Results: Recent Labs    03/18/20 2359 03/19/20 1234  WBC 6.9 9.9  HGB 6.7* 10.2*  HCT 20.7* 31.0*  PLT 187 210   BMET Recent Labs    03/18/20 1713 03/18/20 2359  NA 135 135  K 4.4 3.9  CL 102 102  CO2 25 25  GLUCOSE 160* 140*  BUN 17 20  CREATININE 0.87 1.07*  CALCIUM 8.2* 8.1*    COAG Lab Results  Component Value Date   INR 1.1 03/15/2020   INR 1.1 03/09/2020   INR 1.1 03/08/2020   No results found for: PTT   I have seen and evaluated the patient. I agree with the PA note as documented above.  Postop day 2 status post right common iliac artery stent with right femoral endarterectomy and right femoral to below-knee pop bypass with PTFE for CLI with rest pain.  She has a palpable AT pulse and very brisk signals in the feet.  Incisions look good.  Continue to mobilize.  Responded to blood transfusion yesterday hemoglobin greater than 10 today.  Pain control seems much better after increasing to 10 mg of oxycodone which is her home dose.  Continue aspirin  Plavix.  [name], MD [facility] [place] Office: [phone]

## 2020-03-21 ENCOUNTER — Inpatient Hospital Stay (HOSPITAL_COMMUNITY): Payer: Medicare Other

## 2020-03-21 ENCOUNTER — Encounter (HOSPITAL_COMMUNITY): Payer: Self-pay | Admitting: Vascular Surgery

## 2020-03-21 DIAGNOSIS — I739 Peripheral vascular disease, unspecified: Secondary | ICD-10-CM

## 2020-03-21 DIAGNOSIS — Z9889 Other specified postprocedural states: Secondary | ICD-10-CM

## 2020-03-21 LAB — HEMOGLOBIN AND HEMATOCRIT, BLOOD
HCT: 24.5 % — ABNORMAL LOW (ref 36.0–46.0)
Hemoglobin: 7.6 g/dL — ABNORMAL LOW (ref 12.0–15.0)

## 2020-03-21 LAB — CBC
HCT: 23.4 % — ABNORMAL LOW (ref 36.0–46.0)
Hemoglobin: 7.7 g/dL — ABNORMAL LOW (ref 12.0–15.0)
MCH: 30.7 pg (ref 26.0–34.0)
MCHC: 32.9 g/dL (ref 30.0–36.0)
MCV: 93.2 fL (ref 80.0–100.0)
Platelets: 181 10*3/uL (ref 150–400)
RBC: 2.51 MIL/uL — ABNORMAL LOW (ref 3.87–5.11)
RDW: 18 % — ABNORMAL HIGH (ref 11.5–15.5)
WBC: 8.8 10*3/uL (ref 4.0–10.5)
nRBC: 0.3 % — ABNORMAL HIGH (ref 0.0–0.2)

## 2020-03-21 LAB — POCT I-STAT, CHEM 8
BUN: 13 mg/dL (ref 8–23)
Calcium, Ion: 1.24 mmol/L (ref 1.15–1.40)
Chloride: 98 mmol/L (ref 98–111)
Creatinine, Ser: 0.7 mg/dL (ref 0.44–1.00)
Glucose, Bld: 121 mg/dL — ABNORMAL HIGH (ref 70–99)
HCT: 19 % — ABNORMAL LOW (ref 36.0–46.0)
Hemoglobin: 6.5 g/dL — CL (ref 12.0–15.0)
Potassium: 4.1 mmol/L (ref 3.5–5.1)
Sodium: 138 mmol/L (ref 135–145)
TCO2: 29 mmol/L (ref 22–32)

## 2020-03-21 LAB — BASIC METABOLIC PANEL
Anion gap: 4 — ABNORMAL LOW (ref 5–15)
BUN: 13 mg/dL (ref 8–23)
CO2: 27 mmol/L (ref 22–32)
Calcium: 7.8 mg/dL — ABNORMAL LOW (ref 8.9–10.3)
Chloride: 105 mmol/L (ref 98–111)
Creatinine, Ser: 0.76 mg/dL (ref 0.44–1.00)
GFR calc Af Amer: >60 mL/min (ref >60)
GFR calc non Af Amer: >60 mL/min (ref >60)
Glucose, Bld: 94 mg/dL (ref 70–99)
Potassium: 3.7 mmol/L (ref 3.5–5.1)
Sodium: 136 mmol/L (ref 135–145)

## 2020-03-21 NOTE — Progress Notes (Signed)
ABI has been completed.   Preliminary results in CV Proc.   Marie Johnson 03/21/2020 9:58 AM

## 2020-03-21 NOTE — Progress Notes (Signed)
Mobility Specialist - Progress Note   03/21/20 1443  Mobility  Activity Ambulated in hall  Mobility performed by Family member  $Mobility charge 1 Mobility   Pt initially refused mobility with me, she was then seen ambulating in the hall with her family member.  Pricilla Handler Mobility Specialist Mobility Specialist Phone: [phone]

## 2020-03-21 NOTE — Progress Notes (Addendum)
[facility] [place]  Subjective  - Pain control a little better.    Objective 140/90 82 98.4 F (36.9 C) (Oral) 17 100%  Intake/Output Summary (Last 24 hours) at 03/21/2020 0805 Last data filed at 03/20/2020 2876 Gross per 24 hour  Intake 240 ml  Output --  Net 240 ml    Doppler signals right LE DP/PT intact, mod edema Right LE incisions healing well, groin soft without hematoma Lungs non labored breathing with productive cough. Heart RRR, BP periods of hypotension systolic 811    Assessment/Planning: POD # 3   Pain control is improving Right LE edema, patent bypass with good doppler signals NS @ 50, PRBC 4 units HGB 7.7 drop from 03/20/20 10.2.  I have ordered H/H redraw this am.  She is asymptomatic currently sitting up eating breakfast.   She is voiding urine OP not recorded last 24 hours.  Cr WNL. We discussed right LE elevation , but she is unable to lay flat due to COPD. Continue aspirin Plavix.   Roxy Horseman 03/21/2020 8:05 AM --  Laboratory Lab Results: Recent Labs    03/19/20 1234 03/21/20 0426  WBC 9.9 8.8  HGB 10.2* 7.7*  HCT 31.0* 23.4*  PLT 210 181   BMET Recent Labs    03/18/20 2359 03/21/20 0426  NA 135 136  K 3.9 3.7  CL 102 105  CO2 25 27  GLUCOSE 140* 94  BUN 20 13  CREATININE 1.07* 0.76  CALCIUM 8.1* 7.8*    COAG Lab Results  Component Value Date   INR 1.1 03/15/2020   INR 1.1 03/09/2020   INR 1.1 03/08/2020   No results found for: PTT   I have seen and evaluated the patient. I agree with the PA note as documented above.  Postop day 3 status post right femoral endarterectomy with retrograde iliac stent and right common femoral to below-knee popliteal bypass with PTFE.  Continues to have very good perfusion to the right leg.  AT is palpable with brisk signals in the foot.  Walking with therapy this morning.  We will recheck H&H given trended down from 10.2 to -7.7 to see if this is real.   Continue aspirin and plavix.    [name], MD [facility] [place] Office: [phone]

## 2020-03-21 NOTE — Progress Notes (Signed)
Physical Therapy Treatment Patient Details Name: Marie Johnson MRN: [id number] DOB: 11/13/1950 Today's Date: 03/21/2020    History of Present Illness Pt is a 69 y/o female now s/p R femoral endarterectomy with retrograde right common iliac stent and common femoral to below-knee pop bypass.  PMHx includes anemia, anxiety, CHF, CAD, PAD, COPD, hx abdominal aortogram 03/09/20, hx hip sx    PT Comments    Pt making steady progress toward return home. Pt is hopeful for dc home tomorrow.   Follow Up Recommendations  Home health PT;Supervision/Assistance - 24 hour     Equipment Recommendations  None recommended by PT    Recommendations for Other Services       Precautions / Restrictions Precautions Precautions: Fall    Mobility  Bed Mobility Overal bed mobility: Needs Assistance Bed Mobility: Sit to Supine       Sit to supine: Min assist   General bed mobility comments: Assist to bring RLE back up into the bed  Transfers Overall transfer level: Needs assistance Equipment used: Rolling walker (2 wheeled);1 person hand held assist Transfers: Sit to/from [facility] Sit to Stand: Min guard         General transfer comment: Incr time and effort  Ambulation/Gait Ambulation/Gait assistance: Supervision Gait Distance (Feet): 150 Feet Assistive device: Rolling walker (2 wheeled) Gait Pattern/deviations: Step-to pattern;Decreased stride length;Antalgic;Decreased weight shift to right Gait velocity: decreased Gait velocity interpretation: <1.31 ft/sec, indicative of household ambulator General Gait Details: Assist for lines and a few verbal cues for sequence.   Stairs             Wheelchair Mobility    Modified Rankin (Stroke Patients Only)       Balance Overall balance assessment: Needs assistance Sitting-balance support: Feet supported;No upper extremity supported Sitting balance-Leahy Scale: Good     Standing balance support: Bilateral  upper extremity supported;During functional activity Standing balance-Leahy Scale: Fair Standing balance comment: Use of UE support due to pain in RLE                            Cognition Arousal/Alertness: Awake/alert Behavior During Therapy: WFL for tasks assessed/performed Overall Cognitive Status: Within Functional Limits for tasks assessed                                        Exercises      General Comments        Pertinent Vitals/Pain Pain Assessment: Faces Faces Pain Scale: Hurts even more Pain Location: RLE Pain Descriptors / Indicators: Discomfort;Grimacing;Guarding;Sore Pain Intervention(s): Limited activity within patient's tolerance;Monitored during session    Home Living                      Prior Function            PT Goals (current goals can now be found in the care plan section) Acute Rehab PT Goals Patient Stated Goal: independence Progress towards PT goals: Progressing toward goals    Frequency    Min 3X/week      PT Plan Current plan remains appropriate    Co-evaluation              AM-PAC PT "6 Clicks" Mobility   Outcome Measure  Help needed turning from your back to your side while in a flat bed without using  bedrails?: A Little Help needed moving from lying on your back to sitting on the side of a flat bed without using bedrails?: A Little Help needed moving to and from a bed to a chair (including a wheelchair)?: A Little Help needed standing up from a chair using your arms (e.g., wheelchair or bedside chair)?: A Little Help needed to walk in hospital room?: A Little Help needed climbing 3-5 steps with a railing? : A Little 6 Click Score: 18    End of Session   Activity Tolerance: Patient tolerated treatment well Patient left: in bed;Other (comment) (on way to vascular lab)   PT Visit Diagnosis: Pain;Difficulty in walking, not elsewhere classified (R26.2) Pain - Right/Left: Right Pain  - part of body: Leg     Time: 5848-3507 PT Time Calculation (min) (ACUTE ONLY): 34 min  Charges:  $Gait Training: 23-37 mins                     [place] Pager [phone] Office [place] 03/21/2020, 10:23 AM

## 2020-03-21 NOTE — Progress Notes (Signed)
Occupational Therapy Treatment Patient Details Name: Marie Johnson MRN: [id number] DOB: 14-May-1951 Today's Date: 03/21/2020    History of present illness Pt is a 69 y/o female now s/p R femoral endarterectomy with retrograde right common iliac stent and common femoral to below-knee pop bypass.  PMHx includes anemia, anxiety, CHF, CAD, PAD, COPD, hx abdominal aortogram 03/09/20, hx hip sx   OT comments  Pt progressing towards OT goals, presents seated in recliner pleasant and willing to participate in session. Pt tolerating room level mobility using RW, toileting and standing grooming ADL during session; overall performing ADL/mobility tasks at minguard assist level. Pt with increased edema in RLE compared to initial eval - education/reinforced importance on elevating LE while at rest with pt verbalizing understanding. She continues to have difficulty with LB ADL given RLE pain/swelling. VSS throughout on RA. Will continue per POC at this time.    Follow Up Recommendations  Home health OT;Supervision/Assistance - 24 hour    Equipment Recommendations  None recommended by OT          Precautions / Restrictions Precautions Precautions: Fall Restrictions Weight Bearing Restrictions: No       Mobility Bed Mobility Overal bed mobility: Needs Assistance Bed Mobility: Sit to Supine       Sit to supine: Min assist   General bed mobility comments: Assist to bring RLE back up into the bed  Transfers Overall transfer level: Needs assistance Equipment used: Rolling walker (2 wheeled);1 person hand held assist Transfers: Sit to/from [facility] Sit to Stand: Min guard;Min assist         General transfer comment: Incr time and effort, stood from recliner and toilet with light assist to rise from lower surface height of toilet     Balance Overall balance assessment: Needs assistance Sitting-balance support: Feet supported;No upper extremity supported Sitting  balance-Leahy Scale: Good     Standing balance support: Bilateral upper extremity supported;During functional activity Standing balance-Leahy Scale: Fair Standing balance comment: Use of UE support due to pain in RLE                           ADL either performed or assessed with clinical judgement   ADL Overall ADL's : Needs assistance/impaired     Grooming: Min guard;Standing;Wash/dry face;Brushing hair Grooming Details (indicate cue type and reason): pt utilizing single UE support on sink counter               Lower Body Dressing Details (indicate cue type and reason): pt continues to have difficulty accessing RLE due to pain/edema; reports she has a sock aide - able to verbalize how to use it but will likely benefit from additional practice  Toilet Transfer: Min guard;Minimal assistance;Ambulation;Regular Toilet;Grab bars;RW [employer] forces technical [employer] Details (indicate cue type and reason): minA to rise from low surface height  Toileting- Clothing Manipulation and Hygiene: Min guard;Sit to/from stand Toileting - Clothing Manipulation Details (indicate cue type and reason): for balance in standing while pt performing pericare/clothing management (gown and briefs)      Functional mobility during ADLs: Min guard;Rolling walker                         Cognition Arousal/Alertness: Awake/alert Behavior During Therapy: WFL for tasks assessed/performed Overall Cognitive Status: Within Functional Limits for tasks assessed  Exercises Exercises: Other exercises General Exercises - Lower Extremity Ankle Circles/Pumps: AROM;Right;5 reps;Seated Other Exercises Other Exercises: encouraged frequent ankle pumps and RLE elevation - RLE with increased edema compared to initial eval    Shoulder Instructions       General Comments VSS on RA    Pertinent Vitals/ Pain       Pain Assessment: Faces Faces Pain Scale:  Hurts even more Pain Location: RLE Pain Descriptors / Indicators: Discomfort;Grimacing;Guarding;Sore Pain Intervention(s): Monitored during session;Repositioned;Other (comment) (elevated end of session)  Home Living                                          Prior Functioning/Environment              Frequency  Min 2X/week        Progress Toward Goals  OT Goals(current goals can now be found in the care plan section)  Progress towards OT goals: Progressing toward goals  Acute Rehab OT Goals Patient Stated Goal: independence OT Goal Formulation: With patient Time For Goal Achievement: 04/02/20 Potential to Achieve Goals: Good ADL Goals Pt Will Perform Grooming: with modified independence;sitting;standing Pt Will Perform Lower Body Bathing: with modified independence;sitting/lateral leans;sit to/from stand Pt Will Perform Lower Body Dressing: with modified independence;sit to/from stand;sitting/lateral leans Pt Will Transfer to Toilet: with modified independence;ambulating Pt Will Perform Toileting - Clothing Manipulation and hygiene: with modified independence;sit to/from stand;sitting/lateral leans  Plan Discharge plan remains appropriate    Co-evaluation                 AM-PAC OT "6 Clicks" Daily Activity     Outcome Measure   Help from another person eating meals?: None Help from another person taking care of personal grooming?: A Little Help from another person toileting, which includes using toliet, bedpan, or urinal?: A Little Help from another person bathing (including washing, rinsing, drying)?: A Lot Help from another person to put on and taking off regular upper body clothing?: A Little Help from another person to put on and taking off regular lower body clothing?: A Lot 6 Click Score: 17    End of Session Equipment Utilized During Treatment: Gait belt;Rolling walker  OT Visit Diagnosis: Other abnormalities of gait and mobility  (R26.89);Pain Pain - Right/Left: Right Pain - part of body: Leg   Activity Tolerance Patient tolerated treatment well   Patient Left in bed;with call bell/phone within reach;with bed alarm set   Nurse Communication Mobility status        Time: 6812-7517 OT Time Calculation (min): 28 min  Charges: OT General Charges $OT Visit: 1 Visit OT Treatments $Self Care/Home Management : 23-37 mins  Lou Cal, OT Acute Rehabilitation Services Pager [phone] Office [phone]   Raymondo Band 03/21/2020, 3:42 PM

## 2020-03-22 LAB — CBC
HCT: 25.4 % — ABNORMAL LOW (ref 36.0–46.0)
Hemoglobin: 8 g/dL — ABNORMAL LOW (ref 12.0–15.0)
MCH: 29.9 pg (ref 26.0–34.0)
MCHC: 31.5 g/dL (ref 30.0–36.0)
MCV: 94.8 fL (ref 80.0–100.0)
Platelets: 240 10*3/uL (ref 150–400)
RBC: 2.68 MIL/uL — ABNORMAL LOW (ref 3.87–5.11)
RDW: 17.9 % — ABNORMAL HIGH (ref 11.5–15.5)
WBC: 7.4 10*3/uL (ref 4.0–10.5)
nRBC: 0.7 % — ABNORMAL HIGH (ref 0.0–0.2)

## 2020-03-22 MED ORDER — OXYCODONE HCL 10 MG PO TABS: 10.0000 mg | ORAL_TABLET | Freq: Four times a day (QID) | ORAL | 0 refills | Status: DC | PRN

## 2020-03-22 NOTE — Progress Notes (Signed)
IV and telemetry discontinued. CCMD notified. Discharge instructions reviewed with patient. All questions answered. Written RX given to patient for outpatient physical therapy.

## 2020-03-22 NOTE — Plan of Care (Signed)
Continue to monitor

## 2020-03-22 NOTE — Care Management Important Message (Signed)
Important Message  Patient Details  Name: Marie Johnson MRN: [id number] Date of Birth: [birth year]   Medicare Important Message Given:  Yes     Shelda Altes 03/22/2020, 3:41 PM

## 2020-03-22 NOTE — Progress Notes (Addendum)
  Progress Note    03/22/2020 7:40 AM 4 Days Post-Op  Subjective:  Some discomfort from RLE edema but no new complaints   Vitals:   03/21/20 2305 03/22/20 0300  BP: (!) 136/57 (!) 130/56  Pulse:    Resp: 16 16  Temp: 98.4 F (36.9 C) 98 F (36.7 C)  SpO2: 99% 100%   Physical Exam: Lungs:  Non labored Incisions:  R groin and RLE incisions c/d/i Extremities:  Palpable R AT; pitting edema to the mid shin Neurologic: A&O  CBC    Component Value Date/Time   WBC 8.8 03/21/2020 0426   RBC 2.51 (L) 03/21/2020 0426   HGB 7.6 (L) 03/21/2020 1105   HGB 13.8 05/22/2018 0925   HCT 24.5 (L) 03/21/2020 1105   HCT 40.7 05/22/2018 0925   PLT 181 03/21/2020 0426   PLT 294 05/22/2018 0925   MCV 93.2 03/21/2020 0426   MCV 99 (H) 05/22/2018 0925   MCH 30.7 03/21/2020 0426   MCHC 32.9 03/21/2020 0426   RDW 18.0 (H) 03/21/2020 0426   RDW 19.2 (H) 05/22/2018 0925   LYMPHSABS 1.7 03/19/2020 1234   LYMPHSABS 1.8 05/22/2018 0925   MONOABS 0.9 03/19/2020 1234   EOSABS 0.0 03/19/2020 1234   EOSABS 0.1 05/22/2018 0925   BASOSABS 0.0 03/19/2020 1234   BASOSABS 0.0 05/22/2018 0925    BMET    Component Value Date/Time   NA 136 03/21/2020 0426   NA 135 07/28/2019 0928   K 3.7 03/21/2020 0426   CL 105 03/21/2020 0426   CO2 27 03/21/2020 0426   GLUCOSE 94 03/21/2020 0426   BUN 13 03/21/2020 0426   BUN 10 07/28/2019 0928   CREATININE 0.76 03/21/2020 0426   CREATININE 0.83 02/22/2016 1007   CALCIUM 7.8 (L) 03/21/2020 0426   GFRNONAA >60 03/21/2020 0426   GFRAA >60 03/21/2020 0426    INR    Component Value Date/Time   INR 1.1 03/15/2020 1007     Intake/Output Summary (Last 24 hours) at 03/22/2020 0740 Last data filed at 03/21/2020 2021 Gross per 24 hour  Intake 3943.03 ml  Output --  Net 3943.03 ml     Assessment/Plan:  69 y.o. female is s/p R iliac stent, fem endart and femoral to popliteal bypass with PTFE 4 Days Post-Op   Palpable R AT; R foot well perfused Recheck CBC  this morning TOC team consulted to arrange PT Possible d/c after the above    Dagoberto Ligas, PA-C [facility] [phone] 03/22/2020 7:40 AM  I have seen and evaluated the patient. I agree with the PA note as documented above.  Doing much better and walking in the hall.  Palpable AT at the ankle with good Doppler signals in the foot.  Incisions look good.  Recheck CBC this morning and if stable plan for discharge with outpatient PT and follow-up in 3 to 4 weeks for wound check.  Will need aspirin and Plavix at discharge for iliac stent.  [name], MD [facility] [place] Office: [phone]

## 2020-03-22 NOTE — Discharge Instructions (Signed)
 [facility] [place]  Discharge instructions  Lower Extremity Bypass Surgery  Please refer to the following instruction for your post-procedure care. Your surgeon or physician assistant will discuss any changes with you.  Activity  You are encouraged to walk as much as you can. You can slowly return to normal activities during the month after your surgery. Avoid strenuous activity and heavy lifting until your doctor tells you it's OK. Avoid activities such as vacuuming or swinging a golf club. Do not drive until your doctor give the OK and you are no longer taking prescription pain medications. It is also normal to have difficulty with sleep habits, eating and bowel movement after surgery. These will go away with time.  Bathing/Showering  You may shower after you go home. Do not soak in a bathtub, hot tub, or swim until the incision heals completely.  Incision Care  Clean your incision with mild soap and water. Shower every day. Pat the area dry with a clean towel. You do not need a bandage unless otherwise instructed. Do not apply any ointments or creams to your incision. If you have open wounds you will be instructed how to care for them or a visiting nurse may be arranged for you. If you have staples or sutures along your incision they will be removed at your post-op appointment. You may have skin glue on your incision. Do not peel it off. It will come off on its own in about one week. If you have a great deal of moisture in your groin, use a gauze help keep this area dry.  Diet  Resume your normal diet. There are no special food restrictions following this procedure. A low fat/ low cholesterol diet is recommended for all patients with vascular disease. In order to heal from your surgery, it is CRITICAL to get adequate nutrition. Your body requires vitamins, minerals, and protein. Vegetables are the best source of vitamins and minerals. Vegetables also provide the  perfect balance of protein. Processed food has little nutritional value, so try to avoid this.  Medications  Resume taking all your medications unless your doctor or nurse practitioner tells you not to. If your incision is causing pain, you may take over-the-counter pain relievers such as acetaminophen (Tylenol). If you were prescribed a stronger pain medication, please aware these medication can cause nausea and constipation. Prevent nausea by taking the medication with a snack or meal. Avoid constipation by drinking plenty of fluids and eating foods with high amount of fiber, such as fruits, vegetables, and grains. Take Colase 100 mg (an over-the-counter stool softener) twice a day as needed for constipation. Do not take Tylenol if you are taking prescription pain medications.  Follow Up  Our office will schedule a follow up appointment 2-3 weeks following discharge.  Please call us immediately for any of the following conditions  Severe or worsening pain in your legs or feet while at rest or while walking Increase pain, redness, warmth, or drainage (pus) from your incision site(s) Fever of 101 degree or higher The swelling in your leg with the bypass suddenly worsens and becomes more painful than when you were in the hospital If you have been instructed to feel your graft pulse then you should do so every day. If you can no longer feel this pulse, call the office immediately. Not all patients are given this instruction.  Leg swelling is common after leg bypass surgery.  The swelling should improve over a few months   following surgery. To improve the swelling, you may elevate your legs above the level of your heart while you are sitting or resting. Your surgeon or physician assistant may ask you to apply an ACE wrap or wear compression (TED) stockings to help to reduce swelling.  Reduce your risk of vascular disease  Stop smoking. If you would like help call [facility] at 1-800-QUIT-NOW  (1-800-784-8669) or [place] at 336-586-4000.  Manage your cholesterol Maintain a desired weight Control your diabetes weight Control your diabetes Keep your blood pressure down  If you have any questions, please call the office at [phone]   

## 2020-03-22 NOTE — Progress Notes (Signed)
Mobility Specialist - Progress Note   03/22/20 1357  Mobility  Activity Ambulated in hall;Ambulated to bathroom  Level of Assistance Modified independent, requires aide device or extra time  Assistive Device Front wheel walker  Distance Ambulated (ft) 130 ft  Mobility Response Tolerated well  Mobility performed by Mobility specialist  $Mobility charge 1 Mobility    Pre-mobility: 84 HR, 98% SpO2 Post-mobility: 88 HR, 99% SPO2   Marie Johnson Transport planner Phone: [phone]

## 2020-03-22 NOTE — Progress Notes (Signed)
Physical Therapy Treatment Patient Details Name: Marie Johnson MRN: [id number] DOB: 03/26/1951 Today's Date: 03/22/2020    History of Present Illness Pt is a 69 y/o female now s/p R femoral endarterectomy with retrograde right common iliac stent and common femoral to below-knee pop bypass.  PMH includes anemia, anxiety, CHF, CAD, PAD, COPD, abdominal aortogram 03/09/20, hip sx.   PT Comments    Pt progressing well with mobility. Today's session focused on gait training, including increased RLE WBAT and heel cord stretching while weight bearing. Pt with improving tolerance of this. Continues to require use of RW to offload painful RLE with ambulation. Hopeful for d/c home today. Requests outpatient PT instead of HHPT so that she can coordinate visits with husband driving himself to outpatient PT visits (CM notified of request).   Follow Up Recommendations  Outpatient PT;Supervision/Assistance - 24 hour (pt requesting Outpatient PT to coordinate with husband's Outpatient PT visits if possible)     Equipment Recommendations  None recommended by PT    Recommendations for Other Services       Precautions / Restrictions Precautions Precautions: Fall Restrictions Weight Bearing Restrictions: No    Mobility  Bed Mobility               General bed mobility comments: Received sitting in recliner  Transfers Overall transfer level: Modified independent Equipment used: None;Rolling walker (2 wheeled) Transfers: Sit to/from Stand           General transfer comment: Increased time and effort; mod indep to stand from recliner and BSC with heavy reliance on BUE support to push into standing  Ambulation/Gait Ambulation/Gait assistance: Supervision Gait Distance (Feet): 124 Feet Assistive device: 1 person hand held assist;Rolling walker (2 wheeled) Gait Pattern/deviations: Step-through pattern;Decreased stride length;Antalgic;Decreased weight shift to right Gait velocity:  Decreased   General Gait Details: Initial gait training in room without DME, pt reliant on HHA and reaching to furniture for added stability; further gait with RW, cues to focus on RLE heel cord stretch with multiple standing rest breaks to stretch heel to ground, this improving with distance; improving WBAT through RLE   Stairs             Wheelchair Mobility    Modified Rankin (Stroke Patients Only)       Balance Overall balance assessment: Needs assistance Sitting-balance support: Feet supported;No upper extremity supported Sitting balance-Leahy Scale: Good       Standing balance-Leahy Scale: Fair Standing balance comment: Can static stand without UE support; stability improved with UE support to offload painful RLE                            Cognition Arousal/Alertness: Awake/alert Behavior During Therapy: WFL for tasks assessed/performed Overall Cognitive Status: Within Functional Limits for tasks assessed                                 General Comments: WFL for simple tasks, not formally assessed      Exercises Other Exercises Other Exercises: Standing RLE gastroc/heel cord stretch and hip flexor stretch    General Comments        Pertinent Vitals/Pain Pain Assessment: Faces Faces Pain Scale: Hurts little more Pain Location: RLE Pain Descriptors / Indicators: Discomfort;Guarding;Sore Pain Intervention(s): Monitored during session    Home Living  Prior Function            PT Goals (current goals can now be found in the care plan section) Progress towards PT goals: Progressing toward goals    Frequency    Min 3X/week      PT Plan Current plan remains appropriate    Co-evaluation              AM-PAC PT "6 Clicks" Mobility   Outcome Measure  Help needed turning from your back to your side while in a flat bed without using bedrails?: None Help needed moving from lying on your  back to sitting on the side of a flat bed without using bedrails?: None Help needed moving to and from a bed to a chair (including a wheelchair)?: None Help needed standing up from a chair using your arms (e.g., wheelchair or bedside chair)?: None Help needed to walk in hospital room?: None Help needed climbing 3-5 steps with a railing? : A Little 6 Click Score: 23    End of Session Equipment Utilized During Treatment: Gait belt Activity Tolerance: Patient tolerated treatment well Patient left: in chair;with call bell/phone within reach   PT Visit Diagnosis: Pain;Difficulty in walking, not elsewhere classified (R26.2) Pain - Right/Left: Right Pain - part of body: Leg     Time: 0801-0828 PT Time Calculation (min) (ACUTE ONLY): 27 min  Charges:  $Gait Training: 8-22 mins $Therapeutic Exercise: 8-22 mins                    Mabeline Caras, PT, DPT Acute Rehabilitation Services  Pager [phone] Office [place] 03/22/2020, 9:19 AM

## 2020-03-22 NOTE — TOC Transition Note (Signed)
Transition of Care (TOC) - CM/SW Discharge Note Marvetta Gibbons RN, BSN Transitions of Care Unit 4E- RN Case Manager See Treatment Team for direct phone #     Patient Details  Name: Marie Johnson MRN: [id number] Date of Birth: [birth year]  Transition of Care [facility]) CM/SW Contact:  Dawayne Patricia, RN Phone Number: 03/22/2020, 3:04 PM   Clinical Narrative:    Pt stable for transition home, PT recommending [facility]- however pt would prefer to go to outpt therapy as her spouse currently goes to outpt- location [place]. Pt will need script for outpt PT- Matt -PA with [facility] and to provide pt with script for outpt PT.  No other TOC needs noted.    Final next level of care: OP Rehab Barriers to Discharge: No Barriers Identified   Patient Goals and CMS Choice Patient states their goals for this hospitalization and ongoing recovery are:: return home CMS Medicare.gov Compare Post Acute Care list provided to:: Patient Choice offered to / list presented to : Patient  Discharge Placement                  Home      Discharge Plan and Services                   Out PT       HH Arranged: Refused [facility] (wants to go to outpt therapy at [place]) [place] Agency: NA        Social Determinants of Health ([place]) Interventions     Readmission Risk Interventions Readmission Risk Prevention Plan 03/22/2020  Transportation Screening Complete  PCP or Specialist Appt within 5-7 Days Complete  Home Care Screening Complete  Medication Review (RN CM) Complete  Some recent data might be hidden

## 2020-03-22 NOTE — Progress Notes (Signed)
Occupational Therapy Treatment Patient Details Name: Marie Johnson MRN: [id number] DOB: Nov 05, 1950 Today's Date: 03/22/2020    History of present illness Pt is a 69 y/o female now s/p R femoral endarterectomy with retrograde right common iliac stent and common femoral to below-knee pop bypass.  PMH includes anemia, anxiety, CHF, CAD, PAD, COPD, abdominal aortogram 03/09/20, hip sx.   OT comments  Pt making steady progress towards OT goals this session. Pt continues to report pain in RLE during functional mobility, however able to complete community distance functional mobility with RW and Min guard assist. Pt initially presents more of TDWB in RLE but able to WB more as session progressed. Pt completed LB dressing with use of reacher and sock aid as pt continues to have difficulty reaching LB. Pt able to complete LB dressing with MOD A needing cues for sequencing of task. Pt requires MIN guard- supervision for toileting and supervision for UB ADLs at sink. Pt on RA throuhgout session with O2 > 97%. DC plan remains appropriate, will follow acutely per POC.    Follow Up Recommendations  Home health OT;Supervision/Assistance - 24 hour    Equipment Recommendations  None recommended by OT    Recommendations for Other Services      Precautions / Restrictions Precautions Precautions: Fall Restrictions Weight Bearing Restrictions: No       Mobility Bed Mobility               General bed mobility comments: OOB in recliner  Transfers Overall transfer level: Needs assistance Equipment used: Rolling walker (2 wheeled) Transfers: Sit to/from Stand Sit to Stand: Min guard         General transfer comment: min guard for safety from recliner; increased time and effort noted    Balance Overall balance assessment: Needs assistance Sitting-balance support: Feet supported;No upper extremity supported Sitting balance-Leahy Scale: Good     Standing balance support: Single extremity  supported;During functional activity Standing balance-Leahy Scale: Poor Standing balance comment: at least one UE supported during standing ADLs                           ADL either performed or assessed with clinical judgement   ADL Overall ADL's : Needs assistance/impaired     Grooming: Wash/dry hands;Standing;Supervision/safety Grooming Details (indicate cue type and reason): standing at sink; 1 UE support while standing       Lower Body Bathing Details (indicate cue type and reason): reports LH sponge at home for LB bathing     Lower Body Dressing: Moderate assistance;Sitting/lateral leans;Sit to/from stand Lower Body Dressing Details (indicate cue type and reason): practiced use of sock aid and reacher for LB dressing. MOD A for sequencing of task Toilet Transfer: Min guard;Ambulation;Grab bars;RW;BSC;Regular Glass blower/designer Details (indicate cue type and reason): BSC over regular toilet; min guard for functional mobility with RW Toileting- Clothing Manipulation and Hygiene: Supervision/safety;Sitting/lateral lean     Tub/Shower Transfer Details (indicate cue type and reason): pt reports tub shower with seat Functional mobility during ADLs: Min guard;Rolling walker General ADL Comments: continues to have RLE pain with mobility but able to use LB AE for ADLs     Vision       Perception     Praxis      Cognition Arousal/Alertness: Awake/alert Behavior During Therapy: WFL for tasks assessed/performed Overall Cognitive Status: Within Functional Limits for tasks assessed  General Comments: WFL for simple tasks, not formally assessed        Exercises Other Exercises Other Exercises: Standing RLE gastroc/heel cord stretch and hip flexor stretch   Shoulder Instructions       General Comments pt on RA during session with O2 >97% post ambulation. decreased sensation noted on R anterior ankle. educated pt  about safety risks associated with decreased sensation and alerted RN    Pertinent Vitals/ Pain       Pain Assessment: Faces Faces Pain Scale: Hurts little more Pain Location: RLE Pain Descriptors / Indicators: Discomfort;Guarding;Sore Pain Intervention(s): Limited activity within patient's tolerance;Monitored during session;Repositioned;Other (comment) (elevated RLE d/t edema)  Home Living                                          Prior Functioning/Environment              Frequency  Min 2X/week        Progress Toward Goals  OT Goals(current goals can now be found in the care plan section)  Progress towards OT goals: Progressing toward goals  Acute Rehab OT Goals Patient Stated Goal: independence OT Goal Formulation: With patient Potential to Achieve Goals: Good  Plan Discharge plan remains appropriate;Frequency remains appropriate    Co-evaluation                 AM-PAC OT "6 Clicks" Daily Activity     Outcome Measure   Help from another person eating meals?: None Help from another person taking care of personal grooming?: A Little Help from another person toileting, which includes using toliet, bedpan, or urinal?: A Little Help from another person bathing (including washing, rinsing, drying)?: A Lot Help from another person to put on and taking off regular upper body clothing?: A Little Help from another person to put on and taking off regular lower body clothing?: A Little 6 Click Score: 18    End of Session Equipment Utilized During Treatment: Rolling walker  OT Visit Diagnosis: Other abnormalities of gait and mobility (R26.89);Pain Pain - Right/Left: Right Pain - part of body: Leg   Activity Tolerance Patient tolerated treatment well   Patient Left in chair;with call bell/phone within reach   Nurse Communication Mobility status;Other (comment) (decreased sensation in RLE)        Time: 0900-0950 OT Time Calculation (min):  50 min  Charges: OT General Charges $OT Visit: 1 Visit OT Treatments $Self Care/Home Management : 38-52 mins  Lanier Clam., COTA/L Acute Rehabilitation Services [phone]    Ihor Gully 03/22/2020, 10:06 AM

## 2020-03-22 NOTE — Plan of Care (Signed)
Discharge to home °

## 2020-03-23 NOTE — Discharge Summary (Signed)
[facility]yp[facility] Discharge Summary Patient ID: Marie Johnson [id number] 69 y.o. June 26, 1951  Admit date: 03/18/2020  Discharge date and time: 03/22/2020  4:13 PM   Admitting Physician: [name], MD   Discharge Physician: same  Admission Diagnoses: PAD (peripheral artery disease) ([place]) [I73.9]  Discharge Diagnoses: same  Admission Condition: fair  Discharged Condition: fair  Indication for Admission: Critical limb ischemia of the right lower extremity with rest pain  Hospital Course: Marie Johnson is a 69 year old female who was brought in as an outpatient and underwent right common iliac artery stent placement, right femoral endarterectomy, and right femoral to below the knee popliteal artery bypass with PTFE by Dr. Carlis Abbott on 03/18/2020.  She tolerated the procedure well and was admitted to the hospital postoperatively.  Much of her hospital stay consisted of increasing mobility and postoperative pain control.  At the time of discharge patient has a palpable right anterior tibial artery pulse.  She will follow-up in office in 3 to 4 weeks with Dr. Carlis Abbott.  She will be prescribed 2 to 3 days of narcotics for postoperative pain control.  Physical therapy and Occupational Therapy recommended home health however patient declined this and would prefer outpatient physical therapy at the same facility that her husband attends.  She will be discharged home this morning in stable condition.  She will need to continue her aspirin and Plavix due to new stent placement.  Consults: None  Treatments: surgery: Right common iliac stent, femoral endarterectomy, and femoral to below the knee popliteal artery bypass with PTFE by Dr. Carlis Abbott on 03/18/2020    Disposition: Discharge disposition: 01-Home or Self Care       - For [facility] Registry use ---  Post-op:  Wound infection: No  Graft infection: No  Transfusion: No  New Arrhythmia: No Patency judged by: [ ]  Dopper only, [ ]  Palpable graft pulse, [x  ] Palpable distal pulse, [ ]  ABI inc. > 0.15, [ ]  Duplex D/C Ambulatory Status: Ambulatory  Complications: MI: [x ] No, [ ]  Troponin only, [ ]  EKG or Clinical CHF: No Resp failure: [x ] none, [ ]  Pneumonia, [ ]  Ventilator Chg in renal function: [x ] none, [ ]  Inc. Cr > 0.5, [ ]  Temp. Dialysis, [ ]  Permanent dialysis Stroke: [x ] None, [ ]  Minor, [ ]  Major Return to OR: No  Reason for return to OR: [ ]  Bleeding, [ ]  Infection, [ ]  Thrombosis, [ ]  Revision  Discharge medications: Statin use:  Yes ASA use:  Yes Plavix use:  Yes Beta blocker use: No  for medical reason not indicated Coumadin use: No  for medical reason not indicated    Patient Instructions:  Allergies as of 03/22/2020      Reactions   Bee Venom Anaphylaxis   Bee Venom Anaphylaxis, Other (See Comments)   Welts, also   Insect Extract Allergy Skin Test Anaphylaxis, Other (See Comments)   Insect bites- Welts, also   Nsaids Nausea Only, Other (See Comments)   Patient is not suppose to have this class of medication (liver)   Latex Rash   Latex Rash      Medication List    TAKE these medications   albuterol (2.5 MG/3ML) 0.083% nebulizer solution Commonly known as: PROVENTIL Take 2.5 mg by nebulization every 6 (six) hours as needed for wheezing or shortness of breath.   albuterol 108 (90 Base) MCG/ACT inhaler Commonly known as: VENTOLIN HFA Inhale 2 puffs into the lungs See admin instructions. Inhale  2 puffs into the lungs every four to six hours as needed for wheezing or shortness of breath   albuterol 108 (90 Base) MCG/ACT inhaler Commonly known as: VENTOLIN HFA Inhale 2 puffs into the lungs every 6 (six) hours as needed for wheezing.   alendronate 70 MG tablet Commonly known as: FOSAMAX Take 70 mg by mouth every Friday.   alendronate 70 MG tablet Commonly known as: FOSAMAX Take 70 mg by mouth every Tuesday.   allopurinol 300 MG tablet Commonly known as: ZYLOPRIM Take 300 mg by mouth daily.     allopurinol 300 MG tablet Commonly known as: ZYLOPRIM Take 300 mg by mouth at bedtime.   aspirin EC 81 MG tablet Take 81 mg by mouth daily.   aspirin 81 MG EC tablet Take 1 tablet (81 mg total) by mouth daily. Swallow whole.   atorvastatin 80 MG tablet Commonly known as: LIPITOR Take 1 tablet (80 mg total) by mouth daily.   atorvastatin 80 MG tablet Commonly known as: LIPITOR Take 80 mg by mouth at bedtime.   Benadryl Allergy 25 MG tablet Generic drug: diphenhydrAMINE Take 50 mg by mouth at bedtime.   clopidogrel 75 MG tablet Commonly known as: PLAVIX Take 1 tablet (75 mg total) by mouth daily.   EpiPen 2-Pak 0.3 mg/0.3 mL Soaj injection Generic drug: EPINEPHrine Inject 0.3 mg into the muscle as needed for anaphylaxis.   EpiPen 2-Pak 0.3 mg/0.3 mL Soaj injection Generic drug: EPINEPHrine Inject 0.3 mg into the muscle daily as needed. Use as directed as needed for allergies.   ezetimibe 10 MG tablet Commonly known as: ZETIA Take 1 tablet (10 mg total) by mouth daily.   ezetimibe 10 MG tablet Commonly known as: ZETIA Take 10 mg by mouth at bedtime.   furosemide 40 MG tablet Commonly known as: LASIX Take 1 tablet (40 mg total) by mouth daily.   furosemide 40 MG tablet Commonly known as: LASIX Take 40 mg by mouth in the morning.   Klor-Con M20 20 MEQ tablet Generic drug: potassium chloride SA TAKE 1 TABLET BY MOUTH EVERY DAY   Klor-Con M20 20 MEQ tablet Generic drug: potassium chloride SA Take 20 mEq by mouth in the morning.   lactulose 10 GM/15ML solution Commonly known as: CHRONULAC Take 20 g by mouth as needed for moderate constipation.   Muscle Rub 10-15 % Crea Apply 1 application topically daily as needed (for arthritis pain).   nitroGLYCERIN 0.4 MG SL tablet Commonly known as: NITROSTAT Place 1 tablet (0.4 mg total) under the tongue every 5 (five) minutes x 3 doses as needed for chest pain.   Oxycodone HCl 10 MG Tabs Take 1 tablet (10 mg  total) by mouth 4 (four) times daily as needed (for pain).   Oxycodone HCl 10 MG Tabs Take 10 mg by mouth 3 (three) times daily.   pantoprazole 40 MG tablet Commonly known as: PROTONIX Take 40 mg by mouth See admin instructions. Take 40 mg by mouth in the morning before breakfast and an additional 40 mg once a day as needed for reflux   pantoprazole 40 MG tablet Commonly known as: PROTONIX Take 40 mg by mouth 2 (two) times daily.   senna-docusate 8.6-50 MG tablet Commonly known as: Senokot-S Take 2 tablets by mouth at bedtime.   spironolactone 25 MG tablet Commonly known as: ALDACTONE Take 1 tablet (25 mg total) by mouth daily.   spironolactone 25 MG tablet Commonly known as: ALDACTONE Take 25 mg by mouth in the  morning.   verapamil 240 MG CR tablet Commonly known as: CALAN-SR TAKE 1 TABLET (240 MG TOTAL) BY MOUTH AT BEDTIME.   verapamil 240 MG CR tablet Commonly known as: CALAN-SR Take 240 mg by mouth every evening.      Activity: activity as tolerated Diet: regular diet Wound Care: keep wound clean and dry  Follow-up with Dr. Carlis Abbott in 3 weeks.  SignedDagoberto Ligas 03/23/2020 8:48 AM

## 2020-03-28 DIAGNOSIS — Z79899 Other long term (current) drug therapy: Secondary | ICD-10-CM | POA: Diagnosis not present

## 2020-03-28 DIAGNOSIS — I5032 Chronic diastolic (congestive) heart failure: Secondary | ICD-10-CM | POA: Diagnosis not present

## 2020-03-28 DIAGNOSIS — I70229 Atherosclerosis of native arteries of extremities with rest pain, unspecified extremity: Secondary | ICD-10-CM | POA: Diagnosis not present

## 2020-03-28 DIAGNOSIS — I739 Peripheral vascular disease, unspecified: Secondary | ICD-10-CM | POA: Diagnosis not present

## 2020-03-28 DIAGNOSIS — M62838 Other muscle spasm: Secondary | ICD-10-CM | POA: Diagnosis not present

## 2020-03-28 DIAGNOSIS — I87301 Chronic venous hypertension (idiopathic) without complications of right lower extremity: Secondary | ICD-10-CM | POA: Diagnosis not present

## 2020-04-01 DIAGNOSIS — M62838 Other muscle spasm: Secondary | ICD-10-CM | POA: Diagnosis not present

## 2020-04-01 DIAGNOSIS — Z9181 History of falling: Secondary | ICD-10-CM | POA: Diagnosis not present

## 2020-04-01 DIAGNOSIS — G8929 Other chronic pain: Secondary | ICD-10-CM | POA: Diagnosis not present

## 2020-04-01 DIAGNOSIS — M549 Dorsalgia, unspecified: Secondary | ICD-10-CM | POA: Diagnosis not present

## 2020-04-01 DIAGNOSIS — I739 Peripheral vascular disease, unspecified: Secondary | ICD-10-CM | POA: Diagnosis not present

## 2020-04-01 DIAGNOSIS — R7303 Prediabetes: Secondary | ICD-10-CM | POA: Diagnosis not present

## 2020-04-01 DIAGNOSIS — Z79899 Other long term (current) drug therapy: Secondary | ICD-10-CM | POA: Diagnosis not present

## 2020-04-05 ENCOUNTER — Other Ambulatory Visit: Payer: Self-pay | Admitting: Cardiology

## 2020-04-12 ENCOUNTER — Encounter: Payer: Self-pay | Admitting: Vascular Surgery

## 2020-04-12 ENCOUNTER — Ambulatory Visit (INDEPENDENT_AMBULATORY_CARE_PROVIDER_SITE_OTHER): Payer: Self-pay | Admitting: Vascular Surgery

## 2020-04-12 ENCOUNTER — Other Ambulatory Visit: Payer: Self-pay

## 2020-04-12 VITALS — BP 131/84 | HR 103 | Temp 97.9°F | Resp 14 | Ht 68.0 in | Wt 124.0 lb

## 2020-04-12 DIAGNOSIS — I739 Peripheral vascular disease, unspecified: Secondary | ICD-10-CM

## 2020-04-12 MED ORDER — CLOPIDOGREL BISULFATE 75 MG PO TABS: 75.0000 mg | ORAL_TABLET | Freq: Every day | ORAL | 2 refills | Status: DC

## 2020-04-12 NOTE — Progress Notes (Signed)
Patient name: Marie Johnson MRN: [id number] DOB: 06/28/1951 Sex: female  REASON FOR VISIT: Postop check  HPI: Marie Johnson is a 69 y.o. female presents for postop check after recent intervention for critical limb ischemia with rest pain in the right foot.  On 03/18/20 she underwent right femoral endarterectomy with a retrograde right common iliac stent and a right common femoral to BK popliteal artery bypass with 6 mm PTFE.  Ultimately all of her incisions have healed including the groin incision.  Her main complaint today has been significant swelling in the right leg that has been slowly improving with some diuretics from her PCP.  The rest pain in her right foot is resolved she is now sleeping.  Past Medical History:  Diagnosis Date  . AAA (abdominal aortic aneurysm) (HCC)    3.4 cm  . Anemia   . Anxiety   . Aortic atherosclerosis ([place])   . Arthritis   . Asthma   . CAD (coronary artery disease)    a. Nonobstructive CAD by cath 07/17/13.  . CHD (congenital heart disease)    pt unaware???  . Chest pain    a. Adm 10-07/2013 - CTA neg for PE/dissection, cath with nonobstructive disease, CP ?due to uncontrolled HTN vs coronary vasospasm.  . CHF (congestive heart failure) ([place])   . COPD (chronic obstructive pulmonary disease) ([place])   . Coronary artery disease   . Echocardiogram    Echocardiogram 06/2019: EF 60-65, impaired relaxation (Gr 1 DD), mild MAC, mild MR, mild to mod aortic valve sclerosis (no AS), trivial TR, mild LAE, normal RVSF, RVSP 30.7 (mildly elevated)  . GERD (gastroesophageal reflux disease)   . Hyperlipidemia   . Hypertension   . Neuropathy   . OA (osteoarthritis)   . Osteoporosis   . PAD (peripheral artery disease) ([place])    a. s/p L fem-pop bypass b. recent evaluation at [facility], unamenable to intervention  . PAD (peripheral artery disease) ([place])   . Palpitations 02/22/2016  . Pneumonia   . Pre-diabetes   . Premature ventricular contraction   . PUD (peptic ulcer  disease)   . PVD (peripheral vascular disease) ([place]) 07/17/2013  . Tobacco abuse   . Wears dentures   . Wears glasses     Past Surgical History:  Procedure Laterality Date  . ABDOMINAL AORTOGRAM W/LOWER EXTREMITY N/A 03/09/2020   Procedure: ABDOMINAL AORTOGRAM W/LOWER EXTREMITY;  Surgeon: [name], MD;  Location: [facility];  Service: Cardiovascular;  Laterality: N/A;  . ABDOMINAL HYSTERECTOMY    . BREAST SURGERY     left cyst excision x 2  . CARDIAC CATHETERIZATION     several years ago, nonobstructive, "50-60% blockages"  . ENDARTERECTOMY FEMORAL Right 03/18/2020   Procedure: ENDARTERECTOMY FEMORAL WITH PROFUNDAPLASTY;  Surgeon: [name], MD;  Location: [place];  Service: Vascular;  Laterality: Right;  . FEMORAL-POPLITEAL BYPASS GRAFT Right 03/18/2020   Procedure: BYPASS GRAFT FEMORAL-POPLITEAL ARTERY using GORE PROPATEN VASCULAR GRAFT;  Surgeon: [name], MD;  Location: [place];  Service: Vascular;  Laterality: Right;  . HIP SURGERY     left - bars and screws placed  . HIP SURGERY    . INSERTION OF ILIAC STENT Right 03/18/2020   Procedure: INSERTION OF GORE VIABAHN  ILIAC STENT;  Surgeon: [name], MD;  Location: [place];  Service: Vascular;  Laterality: Right;  . LEFT HEART CATHETERIZATION WITH CORONARY ANGIOGRAM N/A 07/17/2013   Procedure: LEFT HEART CATHETERIZATION WITH CORONARY ANGIOGRAM;  Surgeon: Larey Dresser, MD;  Location: [facility];  Service: Cardiovascular;  Laterality: N/A;  . LOWER EXTREMITY ANGIOGRAM Right 03/18/2020   Procedure: RIGHT ILIAC ARTERIOGRAM WITH AN ILIAC STENT;  Surgeon: [name], MD;  Location: [place];  Service: Vascular;  Laterality: Right;  . MULTIPLE TOOTH EXTRACTIONS    . PATCH ANGIOPLASTY Right 03/18/2020   Procedure: PATCH ANGIOPLASTY using a [place] OF THE PROFUNDA;  Surgeon: [name], MD;  Location: [place];  Service: Vascular;  Laterality: Right;  . PERIPHERAL  VASCULAR CATHETERIZATION N/A 07/16/2016   Procedure: Abdominal Aortogram w/Lower Extremity;  Surgeon: Rosetta Posner, MD;  Location: [place] [facility];  Service: Cardiovascular;  Laterality: N/A;  . PERIPHERAL VASCULAR INTERVENTION Left 03/09/2020   Procedure: PERIPHERAL VASCULAR INTERVENTION;  Surgeon: [name], MD;  Location: [place] [facility];  Service: Cardiovascular;  Laterality: Left;  common and external illiac   . ROTATOR CUFF REPAIR     right side, had torn ligaments as well  . TUBAL LIGATION    . VEIN BYPASS SURGERY     left leg  . WRIST ARTHROCENTESIS     left - broke in 3 places so has bars and screws placed    Family History  Problem Relation Age of Onset  . Cervical cancer Mother   . Heart disease Mother        MI in 11s  . Liver cancer Father   . Heart disease Father   . Lung cancer Maternal Aunt   . Prostate cancer Maternal Uncle        met. anal  . Breast cancer Maternal Grandmother   . Lung cancer Maternal Aunt   . Lung cancer Maternal Aunt   . Stomach cancer Maternal Aunt   . Colon cancer Maternal Aunt   . Lung cancer Maternal Uncle     SOCIAL HISTORY: Social History   Tobacco Use  . Smoking status: Current Every Day Smoker    Packs/day: 0.25    Years: 50.00    Pack years: 12.50    Types: Cigarettes  . Smokeless tobacco: Never Used  . Tobacco comment: 1-2 cigarettes Daily  Substance Use Topics  . Alcohol use: Never    Allergies  Allergen Reactions  . Bee Venom Anaphylaxis  . Bee Venom Anaphylaxis and Other (See Comments)    Welts, also  . Insect Extract Allergy Skin Test Anaphylaxis and Other (See Comments)    Insect bites- Welts, also  . Nsaids Nausea Only and Other (See Comments)    Patient is not suppose to have this class of medication (liver)  . Latex Rash  . Latex Rash    Current Outpatient Medications  Medication Sig Dispense Refill  . albuterol (PROVENTIL HFA;VENTOLIN HFA) 108 (90 BASE) MCG/ACT inhaler Inhale 2 puffs  into the lungs every 6 (six) hours as needed for wheezing.     [name] albuterol (PROVENTIL) (2.5 MG/3ML) 0.083% nebulizer solution Take 2.5 mg by nebulization every 6 (six) hours as needed for wheezing or shortness of breath.    [name] albuterol (VENTOLIN HFA) 108 (90 Base) MCG/ACT inhaler Inhale 2 puffs into the lungs See admin instructions. Inhale 2 puffs into the lungs every four to six hours as needed for wheezing or shortness of breath    . alendronate (FOSAMAX) 70 MG tablet Take 70 mg by mouth every Friday.     [name] alendronate (FOSAMAX) 70 MG tablet Take 70 mg by mouth every Tuesday.     [name]  allopurinol (ZYLOPRIM) 300 MG tablet Take 300 mg by mouth daily.     [name] allopurinol (ZYLOPRIM) 300 MG tablet Take 300 mg by mouth at bedtime.    [name] aspirin EC 81 MG EC tablet Take 1 tablet (81 mg total) by mouth daily. Swallow whole. 30 tablet 11  . aspirin EC 81 MG tablet Take 81 mg by mouth daily.     [name] atorvastatin (LIPITOR) 80 MG tablet Take 1 tablet (80 mg total) by mouth daily. 90 tablet 3  . atorvastatin (LIPITOR) 80 MG tablet Take 80 mg by mouth at bedtime.    . clopidogrel (PLAVIX) 75 MG tablet Take 1 tablet (75 mg total) by mouth daily. 30 tablet 1  . diphenhydrAMINE (BENADRYL ALLERGY) 25 MG tablet Take 50 mg by mouth at bedtime.    [name] EPINEPHrine (EPIPEN 2-PAK) 0.3 mg/0.3 mL IJ SOAJ injection Inject 0.3 mg into the muscle as needed for anaphylaxis.    [name] EPIPEN 2-PAK 0.3 MG/0.3ML SOAJ injection Inject 0.3 mg into the muscle daily as needed. Use as directed as needed for allergies.    [name] ezetimibe (ZETIA) 10 MG tablet Take 10 mg by mouth at bedtime.    [name] ezetimibe (ZETIA) 10 MG tablet Take 1 tablet (10 mg total) by mouth daily. Must keep upcoming appt for further refills 90 tablet 0  . furosemide (LASIX) 40 MG tablet Take 1 tablet (40 mg total) by mouth daily. 30 tablet 6  . furosemide (LASIX) 40 MG tablet Take 40 mg by mouth in the morning.    [name] KLOR-CON M20 20 MEQ tablet TAKE 1 TABLET BY MOUTH EVERY DAY 90  tablet 3  . KLOR-CON M20 20 MEQ tablet Take 20 mEq by mouth in the morning.    . lactulose (CHRONULAC) 10 GM/15ML solution Take 20 g by mouth as needed for moderate constipation.     . Menthol-Methyl Salicylate (MUSCLE RUB) 10-15 % CREA Apply 1 application topically daily as needed (for arthritis pain).    . nitroGLYCERIN (NITROSTAT) 0.4 MG SL tablet Place 1 tablet (0.4 mg total) under the tongue every 5 (five) minutes x 3 doses as needed for chest pain. 25 tablet 4  . Oxycodone HCl 10 MG TABS Take 10 mg by mouth 3 (three) times daily.     . Oxycodone HCl 10 MG TABS Take 1 tablet (10 mg total) by mouth 4 (four) times daily as needed (for pain). 20 tablet 0  . pantoprazole (PROTONIX) 40 MG tablet Take 40 mg by mouth 2 (two) times daily.    . pantoprazole (PROTONIX) 40 MG tablet Take 40 mg by mouth See admin instructions. Take 40 mg by mouth in the morning before breakfast and an additional 40 mg once a day as needed for reflux    . senna-docusate (SENOKOT-S) 8.6-50 MG tablet Take 2 tablets by mouth at bedtime.    [name] spironolactone (ALDACTONE) 25 MG tablet Take 1 tablet (25 mg total) by mouth daily. 90 tablet 3  . spironolactone (ALDACTONE) 25 MG tablet Take 25 mg by mouth in the morning.    . verapamil (CALAN-SR) 240 MG CR tablet TAKE 1 TABLET (240 MG TOTAL) BY MOUTH AT BEDTIME. 90 tablet 2  . verapamil (CALAN-SR) 240 MG CR tablet Take 240 mg by mouth every evening.      No current facility-administered medications for this visit.   Facility-Administered Medications Ordered in Other Visits  Medication Dose Route Frequency Provider Last Rate Last Admin  . DOBUTamine (DOBUTREX) 1,000 mcg/mL  in dextrose 5% 250 mL infusion  30 mcg/kg/min Intravenous Titrated Dorothy Spark, MD        REVIEW OF SYSTEMS:  _0  denotes positive finding, _1  denotes negative finding Cardiac  Comments:  Chest pain or chest pressure:    Shortness of breath upon exertion:    Short of breath when lying flat:      Irregular heart rhythm:        Vascular    Pain in calf, thigh, or hip brought on by ambulation:    Pain in feet at night that wakes you up from your sleep:     Blood clot in your veins:    Leg swelling:         Pulmonary    Oxygen at home:    Productive cough:     Wheezing:         Neurologic    Sudden weakness in arms or legs:     Sudden numbness in arms or legs:     Sudden onset of difficulty speaking or slurred speech:    Temporary loss of vision in one eye:     Problems with dizziness:         Gastrointestinal    Blood in stool:     Vomited blood:         Genitourinary    Burning when urinating:     Blood in urine:        Psychiatric    Major depression:         Hematologic    Bleeding problems:    Problems with blood clotting too easily:        Skin    Rashes or ulcers:        Constitutional    Fever or chills:      PHYSICAL EXAM: Vitals:   04/12/20 1518  BP: (!) 131/84  Pulse: 103  Resp: 14  Temp: 97.9 F (36.6 C)  TempSrc: Temporal  SpO2: 98%  Weight: 124 lb (56.2 kg)  Height: _2  (1.727 m)    GENERAL: The patient is a well-nourished female, in no acute distress. The vital signs are documented above. CARDIAC: There is a regular rate and rhythm.  VASCULAR:  Right groin as well as right leg incisions well-healed Right AT and posterior tibial very brisk by Doppler Significant lower extremity swelling 2+ edema below the knee  DATA:   None  Assessment/Plan:  69 year old female status post right femoral endarterectomy with retrograde right common iliac stent and a right femoral to below-knee pop bypass with PTFE for critical limb ischemia with rest pain on 03/18/20.  Ultimately has a fair amount of swelling below the knee but all of her incisions look great.  She has very brisk anterior tibial posterior tibial signals at the right ankle and symptoms are much improved.  Will instruct her to continue elevating the leg and use Ace wraps to try and  help with some of the swelling.  Will show her how to wrap her leg today.  Discussed this is very common after bypass surgery.  I did refill her Plavix today and instructed that she take aspirin Plavix for least 3 months and then can transition to just aspirin.  Will arrange follow-up in 9 months with ABIs, right leg arterial duplex, and right iliac duplex for surveillance.  Pleased with her progress.   [name], MD [facility] Office: [phone]

## 2020-04-14 ENCOUNTER — Other Ambulatory Visit: Payer: Self-pay | Admitting: *Deleted

## 2020-04-14 DIAGNOSIS — I739 Peripheral vascular disease, unspecified: Secondary | ICD-10-CM

## 2020-05-05 DIAGNOSIS — R7303 Prediabetes: Secondary | ICD-10-CM | POA: Diagnosis not present

## 2020-05-05 DIAGNOSIS — I739 Peripheral vascular disease, unspecified: Secondary | ICD-10-CM | POA: Diagnosis not present

## 2020-05-05 DIAGNOSIS — M549 Dorsalgia, unspecified: Secondary | ICD-10-CM | POA: Diagnosis not present

## 2020-05-05 DIAGNOSIS — G8929 Other chronic pain: Secondary | ICD-10-CM | POA: Diagnosis not present

## 2020-05-19 ENCOUNTER — Other Ambulatory Visit: Payer: Self-pay

## 2020-05-19 ENCOUNTER — Ambulatory Visit: Payer: Medicare Other | Admitting: Cardiology

## 2020-05-19 ENCOUNTER — Encounter: Payer: Self-pay | Admitting: Cardiology

## 2020-05-19 VITALS — BP 142/74 | HR 71 | Ht 67.0 in | Wt 124.2 lb

## 2020-05-19 DIAGNOSIS — I5032 Chronic diastolic (congestive) heart failure: Secondary | ICD-10-CM

## 2020-05-19 DIAGNOSIS — Z720 Tobacco use: Secondary | ICD-10-CM | POA: Diagnosis not present

## 2020-05-19 DIAGNOSIS — I739 Peripheral vascular disease, unspecified: Secondary | ICD-10-CM

## 2020-05-19 DIAGNOSIS — I10 Essential (primary) hypertension: Secondary | ICD-10-CM | POA: Diagnosis not present

## 2020-05-19 DIAGNOSIS — I2510 Atherosclerotic heart disease of native coronary artery without angina pectoris: Secondary | ICD-10-CM

## 2020-05-19 MED ORDER — SPIRIVA HANDIHALER 18 MCG IN CAPS: 18.0000 ug | ORAL_CAPSULE | Freq: Every day | RESPIRATORY_TRACT | 12 refills | Status: DC

## 2020-05-19 MED ORDER — FUROSEMIDE 40 MG PO TABS: 40.0000 mg | ORAL_TABLET | Freq: Two times a day (BID) | ORAL | 0 refills | Status: DC

## 2020-05-19 NOTE — Patient Instructions (Signed)
Medication Instructions:   START USING SPIRIVA INHALER 18 mcg--PLACE ONE CAPSULE INTO CHAMBER AND INHALE ONCE DAILY  INCREASE YOUR LASIX TO 40 MG BY MOUTH TWICE DAILY  *If you need a refill on your cardiac medications before your next appointment, please call your pharmacy*   Lab Work:  TODAY--CBC, BMET, AND PRO-BNP  If you have labs (blood work) drawn today and your tests are completely normal, you will receive your results only by: [name] MyChart Message (if you have MyChart) OR . A paper copy in the mail If you have any lab test that is abnormal or we need to change your treatment, we will call you to review the results.    Follow-Up:  4-6 WEEKS WITH SCOTT WEAVER PA-C PER DR. Romero Liner NEEDS TO BE IN OFFICE VISIT.

## 2020-05-19 NOTE — Progress Notes (Signed)
Cardiology Office Note:    Date:  02/22/3418   ID:  [name], [name] [birth year], MRN [id number]  PCP:  Philmore Pali, NP  Cardiologist:  Ena Dawley, MD  Electrophysiologist:  Thompson Grayer, MD   Referring MD: Philmore Pali, NP   Chief complaint: Claudications, lower extremity erythema  History of Present Illness:    Marie Johnson is a 69 y.o. female with:   Coronary artery disease ? Nonobstructive by cardiac catheterization in 2014 ? Dobutamine echocardiogram 7/17: Low risk ? Myoview 04/2019: Low risk  Diastolic CHF  PVCs -high burden on Holter 6/17 (16%) ? High risk for ablation >> treated with verapamil  Peripheral arterial disease ? S/p L femoral-pop bypass  AAA  COPD  Hypertension  Hyperlipidemia  Tobacco use  Aortic atherosclerosis  Ms. Marie Johnson is coming after a year, she denies any chest pain but is complaining of dyspnea on exertion and shortness of breath at rest, this seems to be attributed to severe COPD, she continues to smoke and is not on any maintenance inhaler either been rescue albuterol.  She recently underwent 2 peripheral surgeries performed by Dr. Carlis Abbott - status post right femoral endarterectomy with retrograde right common iliac stent and a right femoral to below-knee pop bypass with PTFE for critical limb ischemia with rest pain on 03/18/20.  The patient had significant swelling post surgery that was relieved by initiation of 40 mg of Lasix daily however swelling is persisting, and there is residual erythema however no increased warmth, no rash, poorly palpable pulses, she denies fever or chills.  She complains of profound fatigue.  Her hemoglobin was 7.7 at the discharge from the hospital.   Prior CV studies:   The following studies were reviewed today:  Echocardiogram 06/23/2019 EF 60-65, impaired relaxation, mild MAC, mild MR, trivial AI, mild to moderate aortic sclerosis, no aortic stenosis, mild LAE, RVSP 30.7  Myoview 04/22/2019 EF 70,  normal perfusion, low risk  Dobutamine stress echocardiogram 03/22/2016 Normal dobutamine echo . Ambiant PVCls at rest and with infusion  Echocardiogram 03/22/2016 Mild LVH, EF 60-65, normal wall motion, grade 1 diastolic dysfunction, aortic sclerosis, trivial MR  Holter monitor 02/23/2016  Very frequent PVCs in pattern of bigeminy and trigeminy, total 41,000 PVCs in 48 hours.  Myoview 07/16/2013 Normal stress nuclear study.  LV Ejection Fraction: 73%  Echocardiogram 07/18/2013 Mild to moderate LVH, EF 60, normal wall motion, mild LAE, normal RV SF  Cardiac catheterization 07/17/2013 LAD proximal 30-40 LCx ostial 40; OM1 30 ostial RCA proximal 30, mid 30  Past Medical History:  Diagnosis Date  . AAA (abdominal aortic aneurysm) (HCC)    3.4 cm  . Anemia   . Anxiety   . Aortic atherosclerosis ([place])   . Arthritis   . Asthma   . CAD (coronary artery disease)    a. Nonobstructive CAD by cath 07/17/13.  . CHD (congenital heart disease)    pt unaware???  . Chest pain    a. Adm 10-07/2013 - CTA neg for PE/dissection, cath with nonobstructive disease, CP ?due to uncontrolled HTN vs coronary vasospasm.  . CHF (congestive heart failure) ([place])   . COPD (chronic obstructive pulmonary disease) ([place])   . Coronary artery disease   . Echocardiogram    Echocardiogram 06/2019: EF 60-65, impaired relaxation (Gr 1 DD), mild MAC, mild MR, mild to mod aortic valve sclerosis (no AS), trivial TR, mild LAE, normal RVSF, RVSP 30.7 (mildly elevated)  . GERD (gastroesophageal reflux disease)   .  Hyperlipidemia   . Hypertension   . Neuropathy   . OA (osteoarthritis)   . Osteoporosis   . PAD (peripheral artery disease) ([place])    a. s/p L fem-pop bypass b. recent evaluation at [facility], unamenable to intervention  . PAD (peripheral artery disease) ([place])   . Palpitations 02/22/2016  . Pneumonia   . Pre-diabetes   . Premature ventricular contraction   . PUD (peptic ulcer disease)   . PVD (peripheral  vascular disease) ([place]) 07/17/2013  . Tobacco abuse   . Wears dentures   . Wears glasses    Surgical Hx: The patient  has a past surgical history that includes Vein bypass surgery; Rotator cuff repair; Wrist arthrocentesis; Hip surgery; Cardiac catheterization; left heart catheterization with coronary angiogram (N/A, 07/17/2013); Cardiac catheterization (N/A, 07/16/2016); ABDOMINAL AORTOGRAM W/LOWER EXTREMITY (N/A, 03/09/2020); PERIPHERAL VASCULAR INTERVENTION (Left, 03/09/2020); Abdominal hysterectomy; Tubal ligation; Hip surgery; Multiple tooth extractions; Breast surgery; Femoral-popliteal Bypass Graft (Right, 03/18/2020); Patch angioplasty (Right, 03/18/2020); Endarterectomy femoral (Right, 03/18/2020); Insertion of iliac stent (Right, 03/18/2020); and lower extremity angiogram (Right, 03/18/2020).   Current Medications: Current Meds  Medication Sig  . albuterol (PROVENTIL HFA;VENTOLIN HFA) 108 (90 BASE) MCG/ACT inhaler Inhale 2 puffs into the lungs every 6 (six) hours as needed for wheezing.   [name] albuterol (PROVENTIL) (2.5 MG/3ML) 0.083% nebulizer solution Take 2.5 mg by nebulization every 6 (six) hours as needed for wheezing or shortness of breath.  [name] alendronate (FOSAMAX) 70 MG tablet Take 70 mg by mouth every Friday.   [name] allopurinol (ZYLOPRIM) 300 MG tablet Take 300 mg by mouth daily.   [name] aspirin EC 81 MG tablet Take 81 mg by mouth daily.   [name] atorvastatin (LIPITOR) 80 MG tablet Take 80 mg by mouth at bedtime.  . clopidogrel (PLAVIX) 75 MG tablet Take 1 tablet (75 mg total) by mouth daily.  . diphenhydrAMINE (BENADRYL ALLERGY) 25 MG tablet Take 50 mg by mouth at bedtime.  [name] EPIPEN 2-PAK 0.3 MG/0.3ML SOAJ injection Inject 0.3 mg into the muscle daily as needed. Use as directed as needed for allergies.  [name] ezetimibe (ZETIA) 10 MG tablet Take 1 tablet (10 mg total) by mouth daily. Must keep upcoming appt for further refills  . KLOR-CON M20 20 MEQ tablet TAKE 1 TABLET BY MOUTH EVERY DAY  . lactulose  (CHRONULAC) 10 GM/15ML solution Take 20 g by mouth as needed for moderate constipation.   . Menthol-Methyl Salicylate (MUSCLE RUB) 10-15 % CREA Apply 1 application topically daily as needed (for arthritis pain).  . nitroGLYCERIN (NITROSTAT) 0.4 MG SL tablet Place 1 tablet (0.4 mg total) under the tongue every 5 (five) minutes x 3 doses as needed for chest pain.  . Oxycodone HCl 10 MG TABS Take 1 tablet (10 mg total) by mouth 4 (four) times daily as needed (for pain).  . pantoprazole (PROTONIX) 40 MG tablet Take 40 mg by mouth 2 (two) times daily.  [name] senna-docusate (SENOKOT-S) 8.6-50 MG tablet Take 2 tablets by mouth at bedtime.  [name] spironolactone (ALDACTONE) 25 MG tablet Take 1 tablet (25 mg total) by mouth daily.  . verapamil (CALAN-SR) 240 MG CR tablet TAKE 1 TABLET (240 MG TOTAL) BY MOUTH AT BEDTIME.  . [DISCONTINUED] furosemide (LASIX) 40 MG tablet Take 1 tablet (40 mg total) by mouth daily.     Allergies:   Bee venom, Bee venom, Insect extract allergy skin test, Nsaids, Latex, and Latex   Social History   Tobacco Use  . Smoking status: Current Every Day  Smoker    Packs/day: 0.25    Years: 50.00    Pack years: 12.50    Types: Cigarettes  . Smokeless tobacco: Never Used  . Tobacco comment: 1-2 cigarettes Daily  Vaping Use  . Vaping Use: Never used  Substance Use Topics  . Alcohol use: Never  . Drug use: Never     Family Hx: The patient's family history includes Breast cancer in her maternal grandmother; Cervical cancer in her mother; Colon cancer in her maternal aunt; Heart disease in her father and mother; Liver cancer in her father; Lung cancer in her maternal aunt, maternal aunt, maternal aunt, and maternal uncle; Prostate cancer in her maternal uncle; Stomach cancer in her maternal aunt.  ROS:   Please see the history of present illness.    Review of Systems  Gastrointestinal: Negative for hematochezia.  Genitourinary: Negative for hematuria.   All other systems reviewed  and are negative.   EKGs/Labs/Other Test Reviewed:    EKG:  EKG is not ordered today.  The ekg ordered today shows normal sinus rhythm with PVCs unchanged from prior.  Recent Labs: 06/12/2019: NT-Pro BNP 311 07/28/2019: Magnesium 1.8 03/15/2020: ALT 17 03/21/2020: BUN 13; Creatinine, Ser 0.76; Potassium 3.7; Sodium 136 03/22/2020: Hemoglobin 8.0; Platelets 240   Recent Lipid Panel Lab Results  Component Value Date/Time   CHOL 92 03/19/2020 12:34 PM   CHOL 113 06/09/2019 07:59 AM   TRIG 111 03/19/2020 12:34 PM   HDL 29 (L) 03/19/2020 12:34 PM   HDL 45 06/09/2019 07:59 AM   CHOLHDL 3.2 03/19/2020 12:34 PM   LDLCALC 41 03/19/2020 12:34 PM   LDLCALC 55 06/09/2019 07:59 AM    Physical Exam:    VS:  BP (!) 142/74   Pulse 71   Ht _0  (1.702 m)   Wt 124 lb 3.2 oz (56.3 kg)   BMI 19.45 kg/m     Wt Readings from Last 3 Encounters:  05/19/20 124 lb 3.2 oz (56.3 kg)  04/12/20 124 lb (56.2 kg)  03/18/20 122 lb 14.4 oz (55.7 kg)     Physical Exam Constitutional:      Appearance: She is well-developed.  HENT:     Head: Normocephalic and atraumatic.  Eyes:     General: No scleral icterus. Neck:     Thyroid: No thyromegaly.  Cardiovascular:     Rate and Rhythm: Normal rate and regular rhythm.     Heart sounds: No murmur heard.   Pulmonary:     Breath sounds: Decreased breath sounds present. No rales.  Abdominal:     Palpations: Abdomen is soft.  Lymphadenopathy:     Cervical: No cervical adenopathy.  Skin:    General: Skin is warm and dry.  Neurological:     Mental Status: She is alert and oriented to person, place, and time.   Erythema and swelling in bilateral no increased warmth poor peripheral pulses bilaterally.  ASSESSMENT & PLAN:    Chronic diastolic CHF (congestive heart failure) (HCC) Her swelling currently can be mostly attributed to recent vascular surgeries with reperfusion, erythema, I will increase Lasix to 40 mg p.o. twice daily, order BMP and BNP  today.  PVD (peripheral vascular disease) with claudication ([place]) 2 recent vascular surgeries as described above with 2+ swelling and some erythema however no increased warmth does not appear infected I do not think she has cellulitis, just changes consistent with ischemia and refusion.  I will increase Lasix to p.o. twice daily, also obtain CBC as  she was severely anemic post surgery.  Coronary artery disease involving native coronary artery of native heart, angina presence unspecified Nonobstructive coronary disease by cardiac catheterization in 2014.  Myoview in August 2020 was low risk.  She has not had recent chest discomfort.  Continue aspirin, statin.  COPD Not on any maintenance inhalers, I am going to start Spiriva daily, she is not wheezing today, she continues to smoke, she was previously seen by pulmonary but did not follow.  Essential hypertension Slightly elevated today, I am increasing Lasix.  PVCs Managed with verapamil.  Abdominal aortic aneurysm This is followed by primary care.  We will request the most recent AAA ultrasound from [facility]:  No follow-ups on file.   Medication Adjustments/Labs and Tests Ordered: Current medicines are reviewed at length with the patient today.  Concerns regarding medicines are outlined above.  Tests Ordered: Orders Placed This Encounter  Procedures  . CBC  . Pro b natriuretic peptide  . Basic metabolic panel  . EKG 12-Lead   Medication Changes: Meds ordered this encounter  Medications  . tiotropium (SPIRIVA HANDIHALER) 18 MCG inhalation capsule    Sig: Place 1 capsule (18 mcg total) into inhaler and inhale daily.    Dispense:  30 capsule    Refill:  12  . furosemide (LASIX) 40 MG tablet    Sig: Take 1 tablet (40 mg total) by mouth 2 (two) times daily.    Dispense:  180 tablet    Refill:  0    DOSE INCREASE    Signed, Ena Dawley, MD  05/19/2020 9:59 AM    [facility] [place], [place], [place]  [address] Phone: [phone]; Fax: [phone]

## 2020-05-20 LAB — BASIC METABOLIC PANEL
BUN/Creatinine Ratio: 22 (ref 12–28)
BUN: 18 mg/dL (ref 8–27)
CO2: 24 mmol/L (ref 20–29)
Calcium: 8.9 mg/dL (ref 8.7–10.3)
Chloride: 99 mmol/L (ref 96–106)
Creatinine, Ser: 0.81 mg/dL (ref 0.57–1.00)
GFR calc Af Amer: 85 mL/min/{1.73_m2} (ref >59)
GFR calc non Af Amer: 73 mL/min/{1.73_m2} (ref >59)
Glucose: 88 mg/dL (ref 65–99)
Potassium: 4.8 mmol/L (ref 3.5–5.2)
Sodium: 135 mmol/L (ref 134–144)

## 2020-05-20 LAB — CBC
Hematocrit: 32.7 % — ABNORMAL LOW (ref 34.0–46.6)
Hemoglobin: 9.9 g/dL — ABNORMAL LOW (ref 11.1–15.9)
MCH: 25.3 pg — ABNORMAL LOW (ref 26.6–33.0)
MCHC: 30.3 g/dL — ABNORMAL LOW (ref 31.5–35.7)
MCV: 84 fL (ref 79–97)
Platelets: 311 10*3/uL (ref 150–450)
RBC: 3.91 x10E6/uL (ref 3.77–5.28)
RDW: 18.8 % — ABNORMAL HIGH (ref 11.7–15.4)
WBC: 7 10*3/uL (ref 3.4–10.8)

## 2020-05-20 LAB — PRO B NATRIURETIC PEPTIDE: NT-Pro BNP: 216 pg/mL (ref 0–301)

## 2020-06-06 DIAGNOSIS — M549 Dorsalgia, unspecified: Secondary | ICD-10-CM | POA: Diagnosis not present

## 2020-06-06 DIAGNOSIS — R7303 Prediabetes: Secondary | ICD-10-CM | POA: Diagnosis not present

## 2020-06-06 DIAGNOSIS — G8929 Other chronic pain: Secondary | ICD-10-CM | POA: Diagnosis not present

## 2020-06-06 DIAGNOSIS — Z23 Encounter for immunization: Secondary | ICD-10-CM | POA: Diagnosis not present

## 2020-06-06 DIAGNOSIS — I739 Peripheral vascular disease, unspecified: Secondary | ICD-10-CM | POA: Diagnosis not present

## 2020-06-29 DIAGNOSIS — Z1231 Encounter for screening mammogram for malignant neoplasm of breast: Secondary | ICD-10-CM | POA: Diagnosis not present

## 2020-06-30 ENCOUNTER — Telehealth: Payer: Self-pay

## 2020-06-30 NOTE — Telephone Encounter (Signed)
Pt called triage. I have returned her call and left her a VM to call back if she needs assistance.

## 2020-06-30 NOTE — Progress Notes (Signed)
Cardiology Office Note:    Date:  45/85/9292   ID:  Allisson, Schindel [birth year], MRN [id number]  PCP:  Philmore Pali, NP  [facility] Cardiologist:  Ena Dawley, MD  [facility] Electrophysiologist:  Thompson Grayer, MD   Referring MD: Philmore Pali, NP   Chief Complaint:  Follow-up (CHF)    Patient Profile:    Marie Johnson is a 69 y.o. female with:   Coronary artery disease ? Nonobstructive by cardiac catheterization in 2014 ? Dobutamineechocardiogram 7/17: Low risk ? Myoview8/2020: Low risk  Diastolic CHF  PVCs-high burden on Holter6/17 (16%) ? Highrisk for ablation>>treated with verapamil  Peripheral arterial disease ? S/pL femoral-pop bypass ? S/p L CIA stent and L EIA stent 6/21 ? S/p R CIA stent; R FA endarterectomy; R fem-pop (below the knee) bypass 7/21  AAA  COPD  Hypertension  Hyperlipidemia  Tobacco use  Aortic atherosclerosis  Prior CV studies: Echocardiogram 03/10/20 EF 55-60, inf HK, mild LVH, normal RVSF, trivial MR, AV sclerosis (no AS)  Echocardiogram 06/23/2019 EF 60-65, impaired relaxation, mild MAC, mild MR, trivial AI, mild to moderate aortic sclerosis, no aortic stenosis, mild LAE, RVSP 30.7  Myoview 04/22/2019 EF 70, normal perfusion, low risk  Dobutamine stress echocardiogram 03/22/2016 Normal dobutamine echo . Ambiant PVCls at rest and with infusion  Echocardiogram 03/22/2016 Mild LVH, EF 60-65, normal wall motion, grade 1 diastolic dysfunction, aortic sclerosis, trivial MR  Holter monitor 02/23/2016  Very frequent PVCs in pattern of bigeminy and trigeminy, total 41,000 PVCs in 48 hours.  Myoview 07/16/2013 Normal stress nuclear study. LV Ejection Fraction: 73%  Echocardiogram 07/18/2013 Mild to moderate LVH, EF 60, normal wall motion, mild LAE, normal RV SF  Cardiac catheterization 07/17/2013 LAD proximal 30-40 LCx ostial 40; OM1 30 ostial RCA proximal 30, mid 30   History of Present Illness:    Ms.  Johnson was last seen by Dr. Meda Coffee 05/19/20.  She had significant swelling in her legs after her most recent PV procedure in 7/21 with Dr. Carlis Abbott requiring Furosemide.  Her Hgb was 7.7 at DC from the hospital and she remained fatigued.  Her furosemide was increased to 40 mg twice daily.  Hgb was improved to 9.9.  BNP was normal.    She returns for follow up.  She is here alone.  She has had continued issues with leg swelling.  Her R leg has improved.  Her L leg is now worse. She has some discomfort in her R calf.  She also still has numbness in her R leg.  This has been present since her recent revascularization procedure.  She has not had chest pain, syncope, paroxysmal nocturnal dyspnea.  She has noted worsening shortness of breath with exertion, orthopnea.  Her weights at home are actually down.        Past Medical History:  Diagnosis Date  . AAA (abdominal aortic aneurysm) (HCC)    3.4 cm  . Anemia   . Anxiety   . Aortic atherosclerosis ([place])   . Arthritis   . Asthma   . CAD (coronary artery disease)    a. Nonobstructive CAD by cath 07/17/13.  . CHD (congenital heart disease)    pt unaware???  . Chest pain    a. Adm 10-07/2013 - CTA neg for PE/dissection, cath with nonobstructive disease, CP ?due to uncontrolled HTN vs coronary vasospasm.  . CHF (congestive heart failure) ([place])   . COPD (chronic obstructive pulmonary disease) ([place])   . Coronary artery  disease   . Echocardiogram    Echocardiogram 06/2019: EF 60-65, impaired relaxation (Gr 1 DD), mild MAC, mild MR, mild to mod aortic valve sclerosis (no AS), trivial TR, mild LAE, normal RVSF, RVSP 30.7 (mildly elevated)  . GERD (gastroesophageal reflux disease)   . Hyperlipidemia   . Hypertension   . Neuropathy   . OA (osteoarthritis)   . Osteoporosis   . PAD (peripheral artery disease) ([place])    a. s/p L fem-pop bypass b. recent evaluation at [facility], unamenable to intervention  . PAD (peripheral artery disease) ([place])   . Palpitations  02/22/2016  . Pneumonia   . Pre-diabetes   . Premature ventricular contraction   . PUD (peptic ulcer disease)   . PVD (peripheral vascular disease) ([place]) 07/17/2013  . Tobacco abuse   . Wears dentures   . Wears glasses     Current Medications: Current Meds  Medication Sig  . albuterol (PROVENTIL HFA;VENTOLIN HFA) 108 (90 BASE) MCG/ACT inhaler Inhale 2 puffs into the lungs every 6 (six) hours as needed for wheezing.   [name] albuterol (PROVENTIL) (2.5 MG/3ML) 0.083% nebulizer solution Take 2.5 mg by nebulization every 6 (six) hours as needed for wheezing or shortness of breath.  [name] alendronate (FOSAMAX) 70 MG tablet Take 70 mg by mouth every Friday.   [name] allopurinol (ZYLOPRIM) 300 MG tablet Take 300 mg by mouth daily.   [name] aspirin EC 81 MG tablet Take 81 mg by mouth daily.   [name] atorvastatin (LIPITOR) 80 MG tablet Take 80 mg by mouth at bedtime.  . clopidogrel (PLAVIX) 75 MG tablet Take 1 tablet (75 mg total) by mouth daily.  . diphenhydrAMINE (BENADRYL ALLERGY) 25 MG tablet Take 50 mg by mouth at bedtime.  [name] EPIPEN 2-PAK 0.3 MG/0.3ML SOAJ injection Inject 0.3 mg into the muscle daily as needed. Use as directed as needed for allergies.  [name] ezetimibe (ZETIA) 10 MG tablet Take 1 tablet (10 mg total) by mouth daily. Must keep upcoming appt for further refills  . furosemide (LASIX) 40 MG tablet Take 1 tablet (40 mg total) by mouth 2 (two) times daily.  [name] KLOR-CON M20 20 MEQ tablet TAKE 1 TABLET BY MOUTH EVERY DAY  . lactulose (CHRONULAC) 10 GM/15ML solution Take 20 g by mouth as needed for moderate constipation.   . Menthol-Methyl Salicylate (MUSCLE RUB) 10-15 % CREA Apply 1 application topically daily as needed (for arthritis pain).  . nitroGLYCERIN (NITROSTAT) 0.4 MG SL tablet Place 1 tablet (0.4 mg total) under the tongue every 5 (five) minutes x 3 doses as needed for chest pain.  . Oxycodone HCl 10 MG TABS Take 1 tablet (10 mg total) by mouth 4 (four) times daily as needed (for pain).  . pantoprazole  (PROTONIX) 40 MG tablet Take 40 mg by mouth 2 (two) times daily.  [name] senna-docusate (SENOKOT-S) 8.6-50 MG tablet Take 2 tablets by mouth at bedtime.  [name] spironolactone (ALDACTONE) 25 MG tablet Take 1 tablet (25 mg total) by mouth daily.  [name] tiotropium (SPIRIVA HANDIHALER) 18 MCG inhalation capsule Place 1 capsule (18 mcg total) into inhaler and inhale daily. (Patient taking differently: Place 18 mcg into inhaler and inhale every other day. )  . verapamil (CALAN-SR) 240 MG CR tablet TAKE 1 TABLET (240 MG TOTAL) BY MOUTH AT BEDTIME.     Allergies:   Bee venom, Bee venom, Insect extract allergy skin test, Nsaids, Latex, and Latex   Social History   Tobacco Use  . Smoking status: Current Every Day  Smoker    Packs/day: 0.25    Years: 50.00    Pack years: 12.50    Types: Cigarettes  . Smokeless tobacco: Never Used  . Tobacco comment: 1-2 cigarettes Daily  Vaping Use  . Vaping Use: Never used  Substance Use Topics  . Alcohol use: Never  . Drug use: Never     Family Hx: The patient's family history includes Breast cancer in her maternal grandmother; Cervical cancer in her mother; Colon cancer in her maternal aunt; Heart disease in her father and mother; Liver cancer in her father; Lung cancer in her maternal aunt, maternal aunt, maternal aunt, and maternal uncle; Prostate cancer in her maternal uncle; Stomach cancer in her maternal aunt.  Review of Systems  Constitutional: Positive for chills.  Gastrointestinal: Negative for hematochezia and melena.  Genitourinary: Negative for hematuria.     EKGs/Labs/Other Test Reviewed:    EKG:  EKG is   ordered today.  The ekg ordered today demonstrates normal sinus rhythm, heart rate 86, normal axis, PVC, no ST-T wave changes, QTC 469  Recent Labs: 07/28/2019: Magnesium 1.8 03/15/2020: ALT 17 05/19/2020: BUN 18; Creatinine, Ser 0.81; Hemoglobin 9.9; NT-Pro BNP 216; Platelets 311; Potassium 4.8; Sodium 135   Recent Lipid Panel Lab Results    Component Value Date/Time   CHOL 92 03/19/2020 12:34 PM   CHOL 113 06/09/2019 07:59 AM   TRIG 111 03/19/2020 12:34 PM   HDL 29 (L) 03/19/2020 12:34 PM   HDL 45 06/09/2019 07:59 AM   CHOLHDL 3.2 03/19/2020 12:34 PM   LDLCALC 41 03/19/2020 12:34 PM   LDLCALC 55 06/09/2019 07:59 AM      Risk Assessment/Calculations:      Physical Exam:    VS:  BP 122/80   Pulse (!) 102   Ht _0  (1.702 m)   Wt 123 lb (55.8 kg)   SpO2 98%   BMI 19.26 kg/m     Wt Readings from Last 3 Encounters:  07/01/20 123 lb (55.8 kg)  05/19/20 124 lb 3.2 oz (56.3 kg)  04/12/20 124 lb (56.2 kg)     Constitutional:      Appearance: Healthy appearance. Not in distress.  Neck:     Vascular: JVD normal.  Pulmonary:     Effort: Pulmonary effort is normal.     Breath sounds: No wheezing. No rales.  Cardiovascular:     Tachycardia present. Regular rhythm. Normal S1. Normal S2.     Murmurs: There is no murmur.  Edema:    Pretibial: bilateral 2+ pitting edema of the pretibial area.    Ankle: bilateral 1+ pitting edema of the ankle. Abdominal:     General: There is distension.     Palpations: Abdomen is soft.  Skin:    General: Skin is warm and dry.  Neurological:     Mental Status: Alert and oriented to person, place and time.     Cranial Nerves: Cranial nerves are intact.       ASSESSMENT & PLAN:    1. Chronic diastolic CHF (congestive heart failure) ([place]) 2. Bilateral leg edema 3. Shortness of breath She has symptoms of decompensated heart failure with worsening shortness of breath as well as orthopnea in addition to lower extremity swelling.  However, her lungs are clear.  Her exam is difficult but her neck veins appear to be flat.  I suspect her swelling is multifactorial and related to diastolic heart failure, venous insufficiency and reperfusion from recent vascular surgery.  Her swelling has  been persistent since her surgery.  She does have some discomfort in her right calf on exam.  I am  doubtful she has a DVT, but I think we should definitively rule this out.    -Increase furosemide to 80 mg in the morning, 40 mg in the evening x3 days  -Increase potassium to 20 mEq twice daily x3 days  -Resume usual dose of furosemide and potassium after 3 days  -Labs today: CMET, CBC, TSH, BNP  -Bilateral venous Dopplers  -If venous Dopplers negative for DVT, wear compression stockings daily  -Keep legs elevated  -Follow-up with vascular surgery  -Follow-up with Dr. Meda Coffee or me in 2 to 3 weeks  4. PVD (peripheral vascular disease) with claudication (HCC) Status post recent revascularization surgeries.  I have asked her to also follow-up with Dr. Carlis Abbott.  She has a lot of numbness in her right leg since her surgery.  I think it would be best to have her evaluated with [facility] as well give her current symptoms.  5. Essential hypertension The patient's blood pressure is controlled on her current regimen.  Continue current therapy.   6. Coronary artery disease involving native coronary artery of native heart without angina pectoris Non-obstructive disease by cath in 2014.  Low risk Myoview in 2020.  She has not really had any exertional chest discomfort.  Continue aspirin, atorvastatin, ezetimibe.  7. Tobacco abuse She is trying to quit.     Dispo:  Return in about 2 weeks (around 07/15/2020) for Close Follow Up w/ Dr. Meda Coffee, or Richardson Dopp, PA-C, in person.   Medication Adjustments/Labs and Tests Ordered: Current medicines are reviewed at length with the patient today.  Concerns regarding medicines are outlined above.  Tests Ordered: Orders Placed This Encounter  Procedures  . Comprehensive metabolic panel  . Pro b natriuretic peptide (BNP)  . CBC with Differential/Platelet  . TSH  . EKG 12-Lead  . VAS [place] LOWER EXTREMITY VENOUS (DVT)   Medication Changes: No orders of the defined types were placed in this encounter.   Signed, Richardson Dopp, PA-C  07/01/2020 [facility] [place], [place], [place]  [address] Phone: [phone]; Fax: [phone]

## 2020-06-30 NOTE — Telephone Encounter (Signed)
Pt called reporting lower left leg swelling x 1 month with reddish discoloration on the front of leg. Denies any pain, temperature changes or weeping. She also mentions shortness of breath x several months when walking. She has a cardiology appt on tomorrow. Pt is s/p  right femoral endarterectomy with a retrograde right common iliac stent and a right common femoral to BK popliteal artery bypass on 03/18/20. Pt has a h/o CHF. Advised pt her symptoms sound cardiac related and encouraged her to follow up with cardiology as scheduled. If provider feels symptoms are felt to be vascular, contact our office back to be seen. Pt voiced understanding.

## 2020-07-01 ENCOUNTER — Other Ambulatory Visit: Payer: Self-pay

## 2020-07-01 ENCOUNTER — Ambulatory Visit (HOSPITAL_COMMUNITY)
Admission: RE | Admit: 2020-07-01 | Discharge: 2020-07-01 | Disposition: A | Discharge status: Home / Self Care | Payer: Medicare Other | Source: Ambulatory Visit | Attending: Cardiology | Admitting: Cardiology

## 2020-07-01 ENCOUNTER — Ambulatory Visit: Payer: Medicare Other | Admitting: Physician Assistant

## 2020-07-01 ENCOUNTER — Encounter: Payer: Self-pay | Admitting: Physician Assistant

## 2020-07-01 VITALS — BP 122/80 | HR 102 | Ht 67.0 in | Wt 123.0 lb

## 2020-07-01 DIAGNOSIS — I739 Peripheral vascular disease, unspecified: Secondary | ICD-10-CM | POA: Diagnosis not present

## 2020-07-01 DIAGNOSIS — R0602 Shortness of breath: Secondary | ICD-10-CM | POA: Insufficient documentation

## 2020-07-01 DIAGNOSIS — I714 Abdominal aortic aneurysm, without rupture: Secondary | ICD-10-CM

## 2020-07-01 DIAGNOSIS — I5032 Chronic diastolic (congestive) heart failure: Secondary | ICD-10-CM

## 2020-07-01 DIAGNOSIS — I2510 Atherosclerotic heart disease of native coronary artery without angina pectoris: Secondary | ICD-10-CM | POA: Insufficient documentation

## 2020-07-01 DIAGNOSIS — I10 Essential (primary) hypertension: Secondary | ICD-10-CM

## 2020-07-01 DIAGNOSIS — E7849 Other hyperlipidemia: Secondary | ICD-10-CM

## 2020-07-01 DIAGNOSIS — Z720 Tobacco use: Secondary | ICD-10-CM | POA: Diagnosis not present

## 2020-07-01 DIAGNOSIS — R600 Localized edema: Secondary | ICD-10-CM

## 2020-07-01 NOTE — Patient Instructions (Addendum)
Medication Instructions:  Your physician has recommended you make the following change in your medication:  1-Take Furosemide (Lasix) 80 mg in the AM and 40 mg in the PM by mouth for 3 days, then back to taking 40 mg by mouth twice daily.  2-Take Potassium 20 meq by mouth twice daily for 3 days, then take Potassium 20 meq by mouth daily.  *If you need a refill on your cardiac medications before your next appointment, please call your pharmacy*  Lab Work: Your physician recommends that you have lab work today- CMET, BNP, CBC, TSH  If you have labs (blood work) drawn today and your tests are completely normal, you will receive your results only by: [name] MyChart Message (if you have MyChart) OR . A paper copy in the mail If you have any lab test that is abnormal or we need to change your treatment, we will call you to review the results.  Testing/Procedures: Your physician has requested that you have a lower extremity venous duplex STAT. This test is an ultrasound of the veins in the legs or arms. It looks at venous blood flow that carries blood from the heart to the legs. Allow one hour for a Lower Venous exam. There are no restrictions or special instructions.  Follow-Up: At [facility], you and your health needs are our priority.  As part of our continuing mission to provide you with exceptional heart care, we have created designated Provider Care Teams.  These Care Teams include your primary Cardiologist (physician) and Advanced Practice Providers (APPs -  Physician Assistants and Nurse Practitioners) who all work together to provide you with the care you need, when you need it.  We recommend signing up for the patient portal called "MyChart".  Sign up information is provided on this After Visit Summary.  MyChart is used to connect with patients for Virtual Visits (Telemedicine).  Patients are able to view lab/test results, encounter notes, upcoming appointments, etc.  Non-urgent messages can  be sent to your provider as well.   To learn more about what you can do with MyChart, go to [email or web address].    Your next appointment:   3 week(s)  The format for your next appointment:   In Person  Provider:   You may see Ena Dawley, MD or one of the following Advanced Practice Providers on your designated Care Team:    Richardson Dopp, [place]  Other Instructions Please see your vascular surgeon after venous ultra sound is done to follow up.  You can wear compression hose daily after your venous ultrasound is complete.

## 2020-07-02 LAB — COMPREHENSIVE METABOLIC PANEL
ALT: 17 IU/L (ref 0–32)
AST: 23 IU/L (ref 0–40)
Albumin/Globulin Ratio: 1.3 (ref 1.2–2.2)
Albumin: 4.3 g/dL (ref 3.7–4.7)
Alkaline Phosphatase: 97 IU/L (ref 44–121)
BUN/Creatinine Ratio: 17 (ref 12–28)
BUN: 14 mg/dL (ref 8–27)
Bilirubin Total: 0.4 mg/dL (ref 0.0–1.2)
CO2: 24 mmol/L (ref 20–29)
Calcium: 9.5 mg/dL (ref 8.7–10.3)
Chloride: 95 mmol/L — ABNORMAL LOW (ref 96–106)
Creatinine, Ser: 0.81 mg/dL (ref 0.57–1.00)
GFR calc Af Amer: 85 mL/min/{1.73_m2} (ref >59)
GFR calc non Af Amer: 73 mL/min/{1.73_m2} (ref >59)
Globulin, Total: 3.4 g/dL (ref 1.5–4.5)
Glucose: 88 mg/dL (ref 65–99)
Potassium: 4 mmol/L (ref 3.5–5.2)
Sodium: 134 mmol/L (ref 134–144)
Total Protein: 7.7 g/dL (ref 6.0–8.5)

## 2020-07-02 LAB — CBC WITH DIFFERENTIAL/PLATELET
Basophils Absolute: 0 10*3/uL (ref 0.0–0.2)
Basos: 1 %
EOS (ABSOLUTE): 0.1 10*3/uL (ref 0.0–0.4)
Eos: 2 %
Hematocrit: 34.4 % (ref 34.0–46.6)
Hemoglobin: 10.5 g/dL — ABNORMAL LOW (ref 11.1–15.9)
Immature Grans (Abs): 0 10*3/uL (ref 0.0–0.1)
Immature Granulocytes: 1 %
Lymphocytes Absolute: 2.1 10*3/uL (ref 0.7–3.1)
Lymphs: 32 %
MCH: 23.5 pg — ABNORMAL LOW (ref 26.6–33.0)
MCHC: 30.5 g/dL — ABNORMAL LOW (ref 31.5–35.7)
MCV: 77 fL — ABNORMAL LOW (ref 79–97)
Monocytes Absolute: 0.6 10*3/uL (ref 0.1–0.9)
Monocytes: 9 %
Neutrophils Absolute: 3.7 10*3/uL (ref 1.4–7.0)
Neutrophils: 55 %
Platelets: 361 10*3/uL (ref 150–450)
RBC: 4.46 x10E6/uL (ref 3.77–5.28)
RDW: 19.9 % — ABNORMAL HIGH (ref 11.7–15.4)
WBC: 6.6 10*3/uL (ref 3.4–10.8)

## 2020-07-02 LAB — TSH: TSH: 0.964 u[IU]/mL (ref 0.450–4.500)

## 2020-07-02 LAB — PRO B NATRIURETIC PEPTIDE: NT-Pro BNP: 246 pg/mL (ref 0–301)

## 2020-07-04 ENCOUNTER — Telehealth: Payer: Self-pay

## 2020-07-04 ENCOUNTER — Other Ambulatory Visit: Payer: Self-pay | Admitting: Cardiology

## 2020-07-04 NOTE — Telephone Encounter (Signed)
-----   Message from Liliane Shi, [place] sent at 07/04/2020  7:58 AM EDT ----- Venous dopplers are neg for DVT. PLAN:  -See lab result note -Wear compression stockings, continue current medications, follow up as planned. -Send copy to PCP.   Richardson Dopp, PA-C    07/04/2020 7:57 AM

## 2020-07-04 NOTE — Telephone Encounter (Signed)
Addendum to previous note: Patient also has c/o numbness in the right leg from knee to ankle since her surgery and 2 fingers on the right hand that has occurred in the last 2-3 weeks. F/u scheduled.

## 2020-07-04 NOTE — Telephone Encounter (Signed)
The patient has been notified of the result and verbalized understanding.  All questions (if any) were answered. Wilma Flavin, RN 07/04/2020 8:59 AM

## 2020-07-04 NOTE — Telephone Encounter (Signed)
Patient called c/o LLE swelling and pain. Patient had DVT scan done - no evidence of DVT found. Cards told her to f/u with vasc office. Encouraged patient to elevate legs and wear compression stockings. Per patient - her husband is in a wheelchair, so she has difficulty finding time to elevate her legs but "does it as much as she can" and she needs to purchase larger compression stockings since the ones she has are too tight. Scheduled her for f/u visit with PA next week.

## 2020-07-05 ENCOUNTER — Other Ambulatory Visit: Payer: Self-pay

## 2020-07-05 DIAGNOSIS — M791 Myalgia, unspecified site: Secondary | ICD-10-CM

## 2020-07-05 DIAGNOSIS — I739 Peripheral vascular disease, unspecified: Secondary | ICD-10-CM

## 2020-07-05 DIAGNOSIS — I493 Ventricular premature depolarization: Secondary | ICD-10-CM

## 2020-07-05 DIAGNOSIS — E7849 Other hyperlipidemia: Secondary | ICD-10-CM

## 2020-07-05 DIAGNOSIS — I10 Essential (primary) hypertension: Secondary | ICD-10-CM

## 2020-07-05 DIAGNOSIS — I2510 Atherosclerotic heart disease of native coronary artery without angina pectoris: Secondary | ICD-10-CM

## 2020-07-05 MED ORDER — EZETIMIBE 10 MG PO TABS
10.0000 mg | ORAL_TABLET | Freq: Every day | ORAL | 3 refills | Status: DC
Start: 2020-07-05 — End: 2021-04-26

## 2020-07-05 MED ORDER — VERAPAMIL HCL ER 240 MG PO TBCR: 240.0000 mg | EXTENDED_RELEASE_TABLET | Freq: Every day | ORAL | 3 refills | Status: DC

## 2020-07-06 DIAGNOSIS — I739 Peripheral vascular disease, unspecified: Secondary | ICD-10-CM | POA: Diagnosis not present

## 2020-07-06 DIAGNOSIS — G8929 Other chronic pain: Secondary | ICD-10-CM | POA: Diagnosis not present

## 2020-07-06 DIAGNOSIS — M549 Dorsalgia, unspecified: Secondary | ICD-10-CM | POA: Diagnosis not present

## 2020-07-06 DIAGNOSIS — R7303 Prediabetes: Secondary | ICD-10-CM | POA: Diagnosis not present

## 2020-07-06 DIAGNOSIS — Z79899 Other long term (current) drug therapy: Secondary | ICD-10-CM | POA: Diagnosis not present

## 2020-07-14 ENCOUNTER — Other Ambulatory Visit: Payer: Self-pay

## 2020-07-14 ENCOUNTER — Ambulatory Visit: Payer: Medicare Other | Admitting: Physician Assistant

## 2020-07-14 VITALS — BP 147/76 | HR 81 | Temp 98.7°F | Resp 20 | Ht 67.0 in | Wt 125.7 lb

## 2020-07-14 DIAGNOSIS — I739 Peripheral vascular disease, unspecified: Secondary | ICD-10-CM

## 2020-07-14 DIAGNOSIS — I872 Venous insufficiency (chronic) (peripheral): Secondary | ICD-10-CM | POA: Diagnosis not present

## 2020-07-14 DIAGNOSIS — G5621 Lesion of ulnar nerve, right upper limb: Secondary | ICD-10-CM

## 2020-07-14 NOTE — Progress Notes (Signed)
Office Note     CC:  follow up Requesting Provider:  Philmore Pali, NP  HPI: Marie Johnson is a 69 y.o. ([birth year]) female who presents for follow up of peripheral arterial disease. She is s/p right femoral endarterectomy with a retrograde right common iliac stent and a right common femoral to BK popliteal artery bypass with 6 mm PTFE on 03/18/20 by Dr. Carlis Abbott. She has had a lot of RLE swelling post operatively which was being managed by PCP with Diuretics. She feels that it is minimally helping at this time. She also continues to wear compression stockings/ ACE wraps on her legs and elevate when she can. She feels that the appearance of veins on her ankles/ feet has increased over the past 1 month and it has worried her. She had DVT study performed by Cardiologist due to increased swelling and it was negative. She does have known chronic venous insufficiency which she has known for several years. She additionally is complaining of tingling and numbness in both of her legs and achy pain in her left leg especially at night. She does not ambulate a lot due to having chronic bronchitis and being limited by her shortness of breath. She however does not describe any claudication like symptoms and she has no tissue loss or non healing wounds. She also is complaining of numbness in her right 4th and 5th fingers which she says started around 1 month ago also. She is right hand dominant. She does not have any pain but she says she keeps dropping items and can barely make a fist. She has discussed this with her PCP and Cardiologist who suggested it was nerve related but told her to follow up with [place] regarding her circulation.  History of 3.5 cm AAA. She denies any abdominal or back pain.  The pt is on a statin for cholesterol management.  The pt is on a daily aspirin. Other AC: none, just completed 3 month course of Plavix 2 days ago The pt is on ACE and Diuretic for hypertension.   The pt is not diabetic.    Tobacco hx: current, 0.25 ppd  Past Medical History:  Diagnosis Date  . AAA (abdominal aortic aneurysm) (HCC)    3.4 cm  . Anemia   . Anxiety   . Aortic atherosclerosis ([place])   . Arthritis   . Asthma   . CAD (coronary artery disease)    a. Nonobstructive CAD by cath 07/17/13.  . CHD (congenital heart disease)    pt unaware???  . Chest pain    a. Adm 10-07/2013 - CTA neg for PE/dissection, cath with nonobstructive disease, CP ?due to uncontrolled HTN vs coronary vasospasm.  . CHF (congestive heart failure) ([place])   . COPD (chronic obstructive pulmonary disease) ([place])   . Coronary artery disease   . Echocardiogram    Echocardiogram 06/2019: EF 60-65, impaired relaxation (Gr 1 DD), mild MAC, mild MR, mild to mod aortic valve sclerosis (no AS), trivial TR, mild LAE, normal RVSF, RVSP 30.7 (mildly elevated)  . GERD (gastroesophageal reflux disease)   . Hyperlipidemia   . Hypertension   . Neuropathy   . OA (osteoarthritis)   . Osteoporosis   . PAD (peripheral artery disease) ([place])    a. s/p L fem-pop bypass b. recent evaluation at [facility], unamenable to intervention  . PAD (peripheral artery disease) ([place])   . Palpitations 02/22/2016  . Pneumonia   . Pre-diabetes   . Premature ventricular contraction   .  PUD (peptic ulcer disease)   . PVD (peripheral vascular disease) ([place]) 07/17/2013  . Tobacco abuse   . Wears dentures   . Wears glasses     Past Surgical History:  Procedure Laterality Date  . ABDOMINAL AORTOGRAM W/LOWER EXTREMITY N/A 03/09/2020   Procedure: ABDOMINAL AORTOGRAM W/LOWER EXTREMITY;  Surgeon: [name], MD;  Location: [place] [facility];  Service: Cardiovascular;  Laterality: N/A;  . ABDOMINAL HYSTERECTOMY    . BREAST SURGERY     left cyst excision x 2  . CARDIAC CATHETERIZATION     several years ago, nonobstructive, "50-60% blockages"  . ENDARTERECTOMY FEMORAL Right 03/18/2020   Procedure: ENDARTERECTOMY FEMORAL WITH PROFUNDAPLASTY;  Surgeon: [name], MD;  Location: [place];  Service: Vascular;  Laterality: Right;  . FEMORAL-POPLITEAL BYPASS GRAFT Right 03/18/2020   Procedure: BYPASS GRAFT FEMORAL-POPLITEAL ARTERY using GORE PROPATEN VASCULAR GRAFT;  Surgeon: [name], MD;  Location: [place];  Service: Vascular;  Laterality: Right;  . HIP SURGERY     left - bars and screws placed  . HIP SURGERY    . INSERTION OF ILIAC STENT Right 03/18/2020   Procedure: INSERTION OF GORE VIABAHN  ILIAC STENT;  Surgeon: [name], MD;  Location: [place];  Service: Vascular;  Laterality: Right;  . LEFT HEART CATHETERIZATION WITH CORONARY ANGIOGRAM N/A 07/17/2013   Procedure: LEFT HEART CATHETERIZATION WITH CORONARY ANGIOGRAM;  Surgeon: Larey Dresser, MD;  Location: [facility];  Service: Cardiovascular;  Laterality: N/A;  . LOWER EXTREMITY ANGIOGRAM Right 03/18/2020   Procedure: RIGHT ILIAC ARTERIOGRAM WITH AN ILIAC STENT;  Surgeon: [name], MD;  Location: [place];  Service: Vascular;  Laterality: Right;  . MULTIPLE TOOTH EXTRACTIONS    . PATCH ANGIOPLASTY Right 03/18/2020   Procedure: PATCH ANGIOPLASTY using a [place] OF THE PROFUNDA;  Surgeon: [name], MD;  Location: [place];  Service: Vascular;  Laterality: Right;  . PERIPHERAL VASCULAR CATHETERIZATION N/A 07/16/2016   Procedure: Abdominal Aortogram w/Lower Extremity;  Surgeon: Rosetta Posner, MD;  Location: [place] [facility];  Service: Cardiovascular;  Laterality: N/A;  . PERIPHERAL VASCULAR INTERVENTION Left 03/09/2020   Procedure: PERIPHERAL VASCULAR INTERVENTION;  Surgeon: [name], MD;  Location: [place] [facility];  Service: Cardiovascular;  Laterality: Left;  common and external illiac   . ROTATOR CUFF REPAIR     right side, had torn ligaments as well  . TUBAL LIGATION    . VEIN BYPASS SURGERY     left leg  . WRIST ARTHROCENTESIS     left - broke in 3 places so has bars and screws placed    Social History    Socioeconomic History  . Marital status: Married    Spouse name: Not on file  . Number of children: Not on file  . Years of education: Not on file  . Highest education level: Not on file  Occupational History  . Not on file  Tobacco Use  . Smoking status: Current Every Day Smoker    Packs/day: 0.25    Years: 50.00    Pack years: 12.50    Types: Cigarettes  . Smokeless tobacco: Never Used  . Tobacco comment: 1-2 cigarettes Daily  Vaping Use  . Vaping Use: Never used  Substance and Sexual Activity  . Alcohol use: Never  . Drug use: Never  . Sexual activity: Not on file  Other Topics Concern  . Not on file  Social History Narrative   **  Merged History Encounter **       Pt lives in [place] with husband.  Retired Educational psychologist   Social Determinants of Radio broadcast assistant Strain:   . Difficulty of Paying Living Expenses: Not on file  Food Insecurity:   . Worried About Charity fundraiser in the Last Year: Not on file  . Ran Out of Food in the Last Year: Not on file  Transportation Needs:   . Lack of Transportation (Medical): Not on file  . Lack of Transportation (Non-Medical): Not on file  Physical Activity:   . Days of Exercise per Week: Not on file  . Minutes of Exercise per Session: Not on file  Stress:   . Feeling of Stress : Not on file  Social Connections:   . Frequency of Communication with Friends and Family: Not on file  . Frequency of Social Gatherings with Friends and Family: Not on file  . Attends Religious Services: Not on file  . Active Member of Clubs or Organizations: Not on file  . Attends Archivist Meetings: Not on file  . Marital Status: Not on file  Intimate Partner Violence:   . Fear of Current or Ex-Partner: Not on file  . Emotionally Abused: Not on file  . Physically Abused: Not on file  . Sexually Abused: Not on file    Family History  Problem Relation Age of Onset  . Cervical cancer Mother   . Heart disease Mother         MI in 5s  . Liver cancer Father   . Heart disease Father   . Lung cancer Maternal Aunt   . Prostate cancer Maternal Uncle        met. anal  . Breast cancer Maternal Grandmother   . Lung cancer Maternal Aunt   . Lung cancer Maternal Aunt   . Stomach cancer Maternal Aunt   . Colon cancer Maternal Aunt   . Lung cancer Maternal Uncle     Current Outpatient Medications  Medication Sig Dispense Refill  . albuterol (PROVENTIL HFA;VENTOLIN HFA) 108 (90 BASE) MCG/ACT inhaler Inhale 2 puffs into the lungs every 6 (six) hours as needed for wheezing.     [name] albuterol (PROVENTIL) (2.5 MG/3ML) 0.083% nebulizer solution Take 2.5 mg by nebulization every 6 (six) hours as needed for wheezing or shortness of breath.    [name] alendronate (FOSAMAX) 70 MG tablet Take 70 mg by mouth every Friday.     [name] allopurinol (ZYLOPRIM) 300 MG tablet Take 300 mg by mouth daily.     [name] aspirin EC 81 MG tablet Take 81 mg by mouth daily.     [name] atorvastatin (LIPITOR) 80 MG tablet Take 80 mg by mouth at bedtime.    . diphenhydrAMINE (BENADRYL ALLERGY) 25 MG tablet Take 50 mg by mouth at bedtime.    [name] EPIPEN 2-PAK 0.3 MG/0.3ML SOAJ injection Inject 0.3 mg into the muscle daily as needed. Use as directed as needed for allergies.    [name] ezetimibe (ZETIA) 10 MG tablet Take 1 tablet (10 mg total) by mouth daily. 90 tablet 3  . furosemide (LASIX) 40 MG tablet Take 1 tablet (40 mg total) by mouth 2 (two) times daily. 180 tablet 0  . gabapentin (NEURONTIN) 100 MG capsule Take 100 mg by mouth 3 (three) times daily.    [name] KLOR-CON M20 20 MEQ tablet TAKE 1 TABLET BY MOUTH EVERY DAY 90 tablet 3  . lactulose (CHRONULAC) 10 GM/15ML solution  Take 20 g by mouth as needed for moderate constipation.     . Menthol-Methyl Salicylate (MUSCLE RUB) 10-15 % CREA Apply 1 application topically daily as needed (for arthritis pain).    . nitroGLYCERIN (NITROSTAT) 0.4 MG SL tablet Place 1 tablet (0.4 mg total) under the tongue every 5 (five) minutes x 3  doses as needed for chest pain. 25 tablet 4  . Oxycodone HCl 10 MG TABS Take 1 tablet (10 mg total) by mouth 4 (four) times daily as needed (for pain). 20 tablet 0  . pantoprazole (PROTONIX) 40 MG tablet Take 40 mg by mouth 2 (two) times daily.    [name] senna-docusate (SENOKOT-S) 8.6-50 MG tablet Take 2 tablets by mouth at bedtime.    [name] spironolactone (ALDACTONE) 25 MG tablet Take 1 tablet (25 mg total) by mouth daily. 90 tablet 3  . tiotropium (SPIRIVA HANDIHALER) 18 MCG inhalation capsule Place 1 capsule (18 mcg total) into inhaler and inhale daily. (Patient taking differently: Place 18 mcg into inhaler and inhale every other day. ) 30 capsule 12  . verapamil (CALAN-SR) 240 MG CR tablet Take 1 tablet (240 mg total) by mouth at bedtime. 90 tablet 3   No current facility-administered medications for this visit.   Facility-Administered Medications Ordered in Other Visits  Medication Dose Route Frequency Provider Last Rate Last Admin  . DOBUTamine (DOBUTREX) 1,000 mcg/mL in dextrose 5% 250 mL infusion  30 mcg/kg/min Intravenous Titrated Dorothy Spark, MD        Allergies  Allergen Reactions  . Bee Venom Anaphylaxis  . Bee Venom Anaphylaxis and Other (See Comments)    Welts, also  . Insect Extract Allergy Skin Test Anaphylaxis and Other (See Comments)    Insect bites- Welts, also  . Nsaids Nausea Only and Other (See Comments)    Patient is not suppose to have this class of medication (liver)  . Latex Rash  . Latex Rash     REVIEW OF SYSTEMS:  _0  denotes positive finding, _1  denotes negative finding Cardiac  Comments:  Chest pain or chest pressure:    Shortness of breath upon exertion: X   Short of breath when lying flat:    Irregular heart rhythm:        Vascular    Pain in calf, thigh, or hip brought on by ambulation:    Pain in feet at night that wakes you up from your sleep:     Blood clot in your veins:    Leg swelling:  X       Pulmonary    Oxygen at home:      Productive cough:     Wheezing:  X       Neurologic    Sudden weakness in arms or legs:     Sudden numbness in arms or legs:     Sudden onset of difficulty speaking or slurred speech:    Temporary loss of vision in one eye:     Problems with dizziness:         Gastrointestinal    Blood in stool:     Vomited blood:         Genitourinary    Burning when urinating:     Blood in urine:        Psychiatric    Major depression:         Hematologic    Bleeding problems:    Problems with blood clotting too easily:  Skin    Rashes or ulcers:        Constitutional    Fever or chills:      PHYSICAL EXAMINATION:  Vitals:   07/14/20 0855  BP: (!) 147/76  Pulse: 81  Resp: 20  Temp: 98.7 F (37.1 C)  TempSrc: Temporal  SpO2: 99%  Weight: 125 lb 11.2 oz (57 kg)  Height: _0  (1.702 m)    General:  WDWN in NAD; vital signs documented above Gait: Not observed HENT: WNL, normocephalic Pulmonary: normal non-labored breathing , with wheezing Cardiac: regular HR, without  Murmurs without carotid bruit Abdomen: soft, NT, no masses Skin: without rashes , does have significant bruising on upper and lower extremities Vascular Exam/Pulses:  Right Left  Radial 2+ (normal) 2+ (normal)  Ulnar Not palpable Not palpable  Femoral 2+ (normal) 2+ (normal)  Popliteal Not palpable Not palpable  DP Not palpable Not palpable  PT Not palpable Not palpable  Doppler PT/ DP/ Pero right leg, brisk doppler PT/ DP and pero signal left foot Brisk Doppler right radial, ulnar, and palmar signals right hand. Right hand warm. 2/5 grip strength right hand Extremities: without ischemic changes, without Gangrene , without cellulitis; without open wounds; bilateral lower extremity edema right greater than left. Multiple reticular veins and varicose veins bilaterally. Both feet warm and well perfused Musculoskeletal: no muscle wasting or atrophy  Neurologic: A&O X 3;  No focal weakness or  paresthesias are detected Psychiatric:  The pt has Normal affect.   ASSESSMENT/PLAN:: 69 y.o. female here for follow up for her peripheral artery disease. Bilateral lower extremities are well perfused and warm with brisk doppler signals. Bypasses appear to be open clinically. she has known chronic venous insufficiency, which I feel is causing most of her symptoms. I have encouraged her to continue elevating her lower extremities. She can continue to use compression stockings or wrap her legs in ACE wrap daily. She has known AAA. She has no associated symptoms. She will have surveillance evaluation of this at her next visit.  -She will continue with just Aspirin 81 mg daily as well as Statin - I recommend she follow up with PCP regarding right upper extremity numbness. Her arm is well perfused. I think this is likely an ulnar nerve compression and she would benefit from ortho evaluation. I did recommend that she could get OTC wrist brace to see if that would help alleviate discomfort - she will keep her appointment with Dr. Carlis Abbott for April 2022 and she will have non invasive studies at this appointment including Aortoiliac duplex, RLE arterial duplex for bypass surveillance and ABI's  Karoline Caldwell, PA-C [facility] [phone]  Clinic MD:  Dr. Oneida Alar

## 2020-07-21 NOTE — Progress Notes (Signed)
Cardiology Office Note:    Date:  28/12/1322   ID:  Marie Johnson, Marie Johnson [birth year], MRN [id number]  PCP:  Philmore Pali, NP  [facility] Cardiologist:  Ena Dawley, MD  [facility] Electrophysiologist:  Thompson Grayer, MD   Referring MD: Philmore Pali, NP   Chief Complaint:  Follow-up (Edema, CHF)    Patient Profile:    Marie Johnson is a 69 y.o. female with:   Coronary artery disease ? Nonobstructive by cardiac catheterization in 2014 ? Dobutamineechocardiogram 7/17: Low risk ? Myoview8/2020: Low risk  Diastolic CHF  PVCs-high burden on Holter6/17 (16%) ? Highrisk for ablation>>treated with verapamil  Peripheral arterial disease ? S/pL femoral-pop bypass ? S/p L CIA stent and L EIA stent 6/21 ? S/p R CIA stent; R FA endarterectomy; R fem-pop (below the knee) bypass 7/21  AAA  COPD  Hypertension  Hyperlipidemia  Tobacco use  Aortic atherosclerosis  Prior CV studies: Echocardiogram 03/10/20 EF 55-60, inf HK, mild LVH, normal RVSF, trivial MR, AV sclerosis (no AS)  Echocardiogram 06/23/2019 EF 60-65, impaired relaxation, mild MAC, mild MR, trivial AI, mild to moderate aortic sclerosis, no aortic stenosis, mild LAE, RVSP 30.7  Myoview 04/22/2019 EF 70, normal perfusion, low risk  Dobutamine stress echocardiogram 03/22/2016 Normal dobutamine echo . Ambiant PVCls at rest and with infusion  Echocardiogram 03/22/2016 Mild LVH, EF 60-65, normal wall motion, grade 1 diastolic dysfunction, aortic sclerosis, trivial MR  Holter monitor 02/23/2016  Very frequent PVCs in pattern of bigeminy and trigeminy, total 41,000 PVCs in 48 hours.  Echocardiogram 07/18/2013 Mild to moderate LVH, EF 60, normal wall motion, mild LAE, normal RV SF  Cardiac catheterization 07/17/2013 LAD proximal 30-40 LCx ostial 40; OM1 30 ostial RCA proximal 30, mid 30  Myoview 07/16/2013 Normal stress nuclear study. LV Ejection Fraction: 73%   History of Present Illness:     Marie Johnson was last seen on 07/01/2020.  She had continued issues with lower extremity swelling.  She appeared to be volume overloaded.  However, I felt that her swelling was multifactorial and related to heart failure, venous insufficiency as well as recent revascularization.  I adjusted her furosemide.  Venous Dopplers were negative for DVT.  Her albumin and TSH were both normal.  Hemoglobin was fairly stable.  BNP was also normal.  I encouraged her to use lower extremity compression stockings.  She returns for follow-up.  She is here alone.  She is feeling better since last seen.  Her breathing is overall stable.  Her leg swelling is improved.  Her weights have been fairly stable although she has gained 2 pounds since yesterday.  She has occasional chest discomfort.  This seems to be related to asthma exacerbations.  She has not had syncope.  She did see vascular surgery 07/14/20.  Overall, her vascular status was stable.      Past Medical History:  Diagnosis Date  . AAA (abdominal aortic aneurysm) (HCC)    3.4 cm  . Anemia   . Anxiety   . Aortic atherosclerosis ([place])   . Arthritis   . Asthma   . CAD (coronary artery disease)    a. Nonobstructive CAD by cath 07/17/13.  . CHD (congenital heart disease)    pt unaware???  . Chest pain    a. Adm 10-07/2013 - CTA neg for PE/dissection, cath with nonobstructive disease, CP ?due to uncontrolled HTN vs coronary vasospasm.  . CHF (congestive heart failure) ([place])   . COPD (chronic obstructive pulmonary disease) (  [place])   . Coronary artery disease   . Echocardiogram    Echocardiogram 06/2019: EF 60-65, impaired relaxation (Gr 1 DD), mild MAC, mild MR, mild to mod aortic valve sclerosis (no AS), trivial TR, mild LAE, normal RVSF, RVSP 30.7 (mildly elevated)  . GERD (gastroesophageal reflux disease)   . Hyperlipidemia   . Hypertension   . Neuropathy   . OA (osteoarthritis)   . Osteoporosis   . PAD (peripheral artery disease) ([place])    a. s/p L  fem-pop bypass b. recent evaluation at [facility], unamenable to intervention  . PAD (peripheral artery disease) ([place])   . Palpitations 02/22/2016  . Pneumonia   . Pre-diabetes   . Premature ventricular contraction   . PUD (peptic ulcer disease)   . PVD (peripheral vascular disease) ([place]) 07/17/2013  . Tobacco abuse   . Wears dentures   . Wears glasses     Current Medications: Current Meds  Medication Sig  . albuterol (PROVENTIL HFA;VENTOLIN HFA) 108 (90 BASE) MCG/ACT inhaler Inhale 2 puffs into the lungs every 6 (six) hours as needed for wheezing.   [name] albuterol (PROVENTIL) (2.5 MG/3ML) 0.083% nebulizer solution Take 2.5 mg by nebulization every 6 (six) hours as needed for wheezing or shortness of breath.  [name] alendronate (FOSAMAX) 70 MG tablet Take 70 mg by mouth every Friday.   [name] allopurinol (ZYLOPRIM) 300 MG tablet Take 300 mg by mouth daily.   [name] aspirin EC 81 MG tablet Take 81 mg by mouth daily.   [name] atorvastatin (LIPITOR) 80 MG tablet Take 80 mg by mouth at bedtime.  . diphenhydrAMINE (BENADRYL ALLERGY) 25 MG tablet Take 50 mg by mouth at bedtime.  [name] EPIPEN 2-PAK 0.3 MG/0.3ML SOAJ injection Inject 0.3 mg into the muscle daily as needed. Use as directed as needed for allergies.  [name] ezetimibe (ZETIA) 10 MG tablet Take 1 tablet (10 mg total) by mouth daily.  . furosemide (LASIX) 40 MG tablet Take 1 tablet (40 mg total) by mouth 2 (two) times daily.  [name] gabapentin (NEURONTIN) 100 MG capsule Take 100 mg by mouth daily.  [name] KLOR-CON M20 20 MEQ tablet TAKE 1 TABLET BY MOUTH EVERY DAY  . lactulose (CHRONULAC) 10 GM/15ML solution Take 20 g by mouth as needed for moderate constipation.   . Menthol-Methyl Salicylate (MUSCLE RUB) 10-15 % CREA Apply 1 application topically daily as needed (for arthritis pain).  . nitroGLYCERIN (NITROSTAT) 0.4 MG SL tablet Place 1 tablet (0.4 mg total) under the tongue every 5 (five) minutes x 3 doses as needed for chest pain.  . Oxycodone HCl 10 MG TABS Take 1 tablet (10 mg  total) by mouth 4 (four) times daily as needed (for pain).  . pantoprazole (PROTONIX) 40 MG tablet Take 40 mg by mouth 2 (two) times daily.  [name] senna-docusate (SENOKOT-S) 8.6-50 MG tablet Take 2 tablets by mouth at bedtime.  [name] spironolactone (ALDACTONE) 25 MG tablet Take 1 tablet (25 mg total) by mouth daily.  [name] tiotropium (SPIRIVA HANDIHALER) 18 MCG inhalation capsule Place 1 capsule (18 mcg total) into inhaler and inhale daily.  . verapamil (CALAN-SR) 240 MG CR tablet Take 1 tablet (240 mg total) by mouth at bedtime.     Allergies:   Bee venom, Bee venom, Insect extract allergy skin test, Nsaids, Latex, and Latex   Social History   Tobacco Use  . Smoking status: Current Every Day Smoker    Packs/day: 0.25    Years: 50.00    Pack years: 12.50  Types: Cigarettes  . Smokeless tobacco: Never Used  . Tobacco comment: 1-2 cigarettes Daily  Vaping Use  . Vaping Use: Never used  Substance Use Topics  . Alcohol use: Never  . Drug use: Never     Family Hx: The patient's family history includes Breast cancer in her maternal grandmother; Cervical cancer in her mother; Colon cancer in her maternal aunt; Heart disease in her father and mother; Liver cancer in her father; Lung cancer in her maternal aunt, maternal aunt, maternal aunt, and maternal uncle; Prostate cancer in her maternal uncle; Stomach cancer in her maternal aunt.  ROS   EKGs/Labs/Other Test Reviewed:    EKG:  EKG is not ordered today.  The ekg ordered today demonstrates n/a  Recent Labs: 07/28/2019: Magnesium 1.8 07/01/2020: ALT 17; BUN 14; Creatinine, Ser 0.81; Hemoglobin 10.5; NT-Pro BNP 246; Platelets 361; Potassium 4.0; Sodium 134; TSH 0.964   Recent Lipid Panel Lab Results  Component Value Date/Time   CHOL 92 03/19/2020 12:34 PM   CHOL 113 06/09/2019 07:59 AM   TRIG 111 03/19/2020 12:34 PM   HDL 29 (L) 03/19/2020 12:34 PM   HDL 45 06/09/2019 07:59 AM   CHOLHDL 3.2 03/19/2020 12:34 PM   LDLCALC 41 03/19/2020  12:34 PM   LDLCALC 55 06/09/2019 07:59 AM      Risk Assessment/Calculations:      Physical Exam:    VS:  BP 130/70   Pulse 64   Ht _0  (1.727 m)   Wt 126 lb 3.2 oz (57.2 kg)   SpO2 99%   BMI 19.19 kg/m     Wt Readings from Last 3 Encounters:  07/22/20 126 lb 3.2 oz (57.2 kg)  07/14/20 125 lb 11.2 oz (57 kg)  07/01/20 123 lb (55.8 kg)     Constitutional:      Appearance: Healthy appearance. Not in distress.  Neck:     Vascular: JVD normal.  Pulmonary:     Effort: Pulmonary effort is normal.     Breath sounds: No wheezing. No rales.     Comments: Decreased breath sounds Cardiovascular:     Normal rate. Regular rhythm. Normal S1. Normal S2.     Murmurs: There is no murmur.  Edema:    Pretibial: bilateral trace edema of the pretibial area.    Comments: Compression stockings in place Abdominal:     Palpations: Abdomen is soft.  Skin:    General: Skin is warm and dry.  Neurological:     Mental Status: Alert and oriented to person, place and time.     Cranial Nerves: Cranial nerves are intact.      ASSESSMENT & PLAN:    1. Bilateral leg edema As previously noted, I suspect her swelling is all multifactorial and related to venous insufficiency, revascularization surgery as well as HFpEF.  I have advised her to continue to wear compression stockings.  2. Chronic heart failure with preserved ejection fraction (HCC) NYHA IIb.  Overall, volume status stable.  Continue current dose of furosemide.  We discussed the importance of daily weights and when to use extra furosemide.  3. Coronary artery disease involving native coronary artery of native heart without angina pectoris Nonobstructive disease by cardiac catheterization in 2014.  Myoview August 2020 was low risk.  She has occasional chest discomfort but this seems to be related to asthma exacerbation.  Continue aspirin, atorvastatin.  4. PVD (peripheral vascular disease) ([place]) Continue follow-up with vascular  surgery as planned.  5. Essential hypertension The  patient's blood pressure is controlled on her current regimen.  Continue current therapy.      Dispo:  Return in about 6 months (around 01/19/2021) for Routine Follow Up w/ Dr. Meda Coffee, in person.   Medication Adjustments/Labs and Tests Ordered: Current medicines are reviewed at length with the patient today.  Concerns regarding medicines are outlined above.  Tests Ordered: No orders of the defined types were placed in this encounter.  Medication Changes: No orders of the defined types were placed in this encounter.   Signed, Richardson Dopp, PA-C  07/22/2020 9:06 AM    [facility] [address], [place], [place]  [address] Phone: [phone]; Fax: [phone]

## 2020-07-22 ENCOUNTER — Ambulatory Visit: Payer: Medicare Other | Admitting: Physician Assistant

## 2020-07-22 ENCOUNTER — Encounter: Payer: Self-pay | Admitting: Physician Assistant

## 2020-07-22 ENCOUNTER — Other Ambulatory Visit: Payer: Self-pay

## 2020-07-22 VITALS — BP 130/70 | HR 64 | Ht 68.0 in | Wt 126.2 lb

## 2020-07-22 DIAGNOSIS — I2510 Atherosclerotic heart disease of native coronary artery without angina pectoris: Secondary | ICD-10-CM

## 2020-07-22 DIAGNOSIS — I5032 Chronic diastolic (congestive) heart failure: Secondary | ICD-10-CM | POA: Diagnosis not present

## 2020-07-22 DIAGNOSIS — I739 Peripheral vascular disease, unspecified: Secondary | ICD-10-CM

## 2020-07-22 DIAGNOSIS — R600 Localized edema: Secondary | ICD-10-CM

## 2020-07-22 DIAGNOSIS — I10 Essential (primary) hypertension: Secondary | ICD-10-CM | POA: Diagnosis not present

## 2020-07-22 NOTE — Patient Instructions (Signed)
Medication Instructions:  Your physician recommends that you continue on your current medications as directed. Please refer to the Current Medication list given to you today.  *If you need a refill on your cardiac medications before your next appointment, please call your pharmacy*  Lab Work: None ordered today  If you have labs (blood work) drawn today and your tests are completely normal, you will receive your results only by: [name] MyChart Message (if you have MyChart) OR . A paper copy in the mail If you have any lab test that is abnormal or we need to change your treatment, we will call you to review the results.  Testing/Procedures: None ordered today  Follow-Up: At [facility], you and your health needs are our priority.  As part of our continuing mission to provide you with exceptional heart care, we have created designated Provider Care Teams.  These Care Teams include your primary Cardiologist (physician) and Advanced Practice Providers (APPs -  Physician Assistants and Nurse Practitioners) who all work together to provide you with the care you need, when you need it.  Your next appointment:   6 month(s)  The format for your next appointment:   In Person  Provider:   Ena Dawley, MD

## 2020-08-09 DIAGNOSIS — G8929 Other chronic pain: Secondary | ICD-10-CM | POA: Diagnosis not present

## 2020-08-09 DIAGNOSIS — G5790 Unspecified mononeuropathy of unspecified lower limb: Secondary | ICD-10-CM | POA: Diagnosis not present

## 2020-08-09 DIAGNOSIS — I739 Peripheral vascular disease, unspecified: Secondary | ICD-10-CM | POA: Diagnosis not present

## 2020-08-09 DIAGNOSIS — M549 Dorsalgia, unspecified: Secondary | ICD-10-CM | POA: Diagnosis not present

## 2020-08-10 ENCOUNTER — Other Ambulatory Visit: Payer: Self-pay | Admitting: Cardiology

## 2020-08-10 DIAGNOSIS — I739 Peripheral vascular disease, unspecified: Secondary | ICD-10-CM

## 2020-08-10 DIAGNOSIS — I10 Essential (primary) hypertension: Secondary | ICD-10-CM

## 2020-08-10 DIAGNOSIS — I2510 Atherosclerotic heart disease of native coronary artery without angina pectoris: Secondary | ICD-10-CM

## 2020-08-10 DIAGNOSIS — Z720 Tobacco use: Secondary | ICD-10-CM

## 2020-08-10 DIAGNOSIS — I5032 Chronic diastolic (congestive) heart failure: Secondary | ICD-10-CM

## 2020-08-21 ENCOUNTER — Other Ambulatory Visit: Payer: Self-pay | Admitting: Physician Assistant

## 2020-08-21 DIAGNOSIS — I739 Peripheral vascular disease, unspecified: Secondary | ICD-10-CM

## 2020-08-21 DIAGNOSIS — I2510 Atherosclerotic heart disease of native coronary artery without angina pectoris: Secondary | ICD-10-CM

## 2020-08-21 DIAGNOSIS — I10 Essential (primary) hypertension: Secondary | ICD-10-CM

## 2020-08-21 DIAGNOSIS — E7849 Other hyperlipidemia: Secondary | ICD-10-CM

## 2020-08-21 DIAGNOSIS — M791 Myalgia, unspecified site: Secondary | ICD-10-CM

## 2020-09-07 DIAGNOSIS — M549 Dorsalgia, unspecified: Secondary | ICD-10-CM | POA: Diagnosis not present

## 2020-09-07 DIAGNOSIS — G8929 Other chronic pain: Secondary | ICD-10-CM | POA: Diagnosis not present

## 2020-09-07 DIAGNOSIS — J449 Chronic obstructive pulmonary disease, unspecified: Secondary | ICD-10-CM | POA: Diagnosis not present

## 2020-09-07 DIAGNOSIS — I739 Peripheral vascular disease, unspecified: Secondary | ICD-10-CM | POA: Diagnosis not present

## 2020-09-29 DIAGNOSIS — Z681 Body mass index (BMI) 19 or less, adult: Secondary | ICD-10-CM | POA: Diagnosis not present

## 2020-10-10 DIAGNOSIS — M62838 Other muscle spasm: Secondary | ICD-10-CM | POA: Diagnosis not present

## 2020-10-10 DIAGNOSIS — I5032 Chronic diastolic (congestive) heart failure: Secondary | ICD-10-CM | POA: Diagnosis not present

## 2020-10-10 DIAGNOSIS — G5790 Unspecified mononeuropathy of unspecified lower limb: Secondary | ICD-10-CM | POA: Diagnosis not present

## 2020-10-10 DIAGNOSIS — R7303 Prediabetes: Secondary | ICD-10-CM | POA: Diagnosis not present

## 2020-10-10 DIAGNOSIS — Z681 Body mass index (BMI) 19 or less, adult: Secondary | ICD-10-CM | POA: Diagnosis not present

## 2020-10-10 DIAGNOSIS — G8929 Other chronic pain: Secondary | ICD-10-CM | POA: Diagnosis not present

## 2020-10-10 DIAGNOSIS — E559 Vitamin D deficiency, unspecified: Secondary | ICD-10-CM | POA: Diagnosis not present

## 2020-10-10 DIAGNOSIS — M549 Dorsalgia, unspecified: Secondary | ICD-10-CM | POA: Diagnosis not present

## 2020-10-10 DIAGNOSIS — R636 Underweight: Secondary | ICD-10-CM | POA: Diagnosis not present

## 2020-10-10 DIAGNOSIS — J449 Chronic obstructive pulmonary disease, unspecified: Secondary | ICD-10-CM | POA: Diagnosis not present

## 2020-10-10 DIAGNOSIS — Z79899 Other long term (current) drug therapy: Secondary | ICD-10-CM | POA: Diagnosis not present

## 2020-10-10 DIAGNOSIS — I739 Peripheral vascular disease, unspecified: Secondary | ICD-10-CM | POA: Diagnosis not present

## 2020-11-10 DIAGNOSIS — M62838 Other muscle spasm: Secondary | ICD-10-CM | POA: Diagnosis not present

## 2020-11-10 DIAGNOSIS — G5790 Unspecified mononeuropathy of unspecified lower limb: Secondary | ICD-10-CM | POA: Diagnosis not present

## 2020-11-10 DIAGNOSIS — G8929 Other chronic pain: Secondary | ICD-10-CM | POA: Diagnosis not present

## 2020-11-10 DIAGNOSIS — Z681 Body mass index (BMI) 19 or less, adult: Secondary | ICD-10-CM | POA: Diagnosis not present

## 2020-11-10 DIAGNOSIS — M549 Dorsalgia, unspecified: Secondary | ICD-10-CM | POA: Diagnosis not present

## 2020-12-08 DIAGNOSIS — M62838 Other muscle spasm: Secondary | ICD-10-CM | POA: Diagnosis not present

## 2020-12-08 DIAGNOSIS — M549 Dorsalgia, unspecified: Secondary | ICD-10-CM | POA: Diagnosis not present

## 2020-12-08 DIAGNOSIS — G8929 Other chronic pain: Secondary | ICD-10-CM | POA: Diagnosis not present

## 2020-12-08 DIAGNOSIS — Z681 Body mass index (BMI) 19 or less, adult: Secondary | ICD-10-CM | POA: Diagnosis not present

## 2021-01-09 DIAGNOSIS — M549 Dorsalgia, unspecified: Secondary | ICD-10-CM | POA: Diagnosis not present

## 2021-01-09 DIAGNOSIS — M62838 Other muscle spasm: Secondary | ICD-10-CM | POA: Diagnosis not present

## 2021-01-09 DIAGNOSIS — Z79899 Other long term (current) drug therapy: Secondary | ICD-10-CM | POA: Diagnosis not present

## 2021-01-09 DIAGNOSIS — Z681 Body mass index (BMI) 19 or less, adult: Secondary | ICD-10-CM | POA: Diagnosis not present

## 2021-01-09 DIAGNOSIS — G8929 Other chronic pain: Secondary | ICD-10-CM | POA: Diagnosis not present

## 2021-01-17 ENCOUNTER — Ambulatory Visit: Payer: Medicare Other | Admitting: Vascular Surgery

## 2021-01-17 ENCOUNTER — Ambulatory Visit (INDEPENDENT_AMBULATORY_CARE_PROVIDER_SITE_OTHER)
Admission: RE | Admit: 2021-01-17 | Discharge: 2021-01-17 | Disposition: A | Discharge status: Home / Self Care | Payer: Medicare Other | Source: Ambulatory Visit | Attending: Vascular Surgery | Admitting: Vascular Surgery

## 2021-01-17 ENCOUNTER — Encounter: Payer: Self-pay | Admitting: Vascular Surgery

## 2021-01-17 ENCOUNTER — Other Ambulatory Visit: Payer: Self-pay

## 2021-01-17 ENCOUNTER — Ambulatory Visit (HOSPITAL_COMMUNITY)
Admission: RE | Admit: 2021-01-17 | Discharge: 2021-01-17 | Disposition: A | Discharge status: Home / Self Care | Payer: Medicare Other | Source: Ambulatory Visit | Attending: Vascular Surgery | Admitting: Vascular Surgery

## 2021-01-17 VITALS — BP 146/79 | HR 76 | Temp 98.2°F | Resp 14 | Ht 68.0 in | Wt 117.0 lb

## 2021-01-17 DIAGNOSIS — I739 Peripheral vascular disease, unspecified: Secondary | ICD-10-CM

## 2021-01-17 NOTE — Progress Notes (Signed)
Patient name: Marie Johnson MRN: [id number] DOB: [birth year] Sex: female  REASON FOR VISIT: Surveillance of bilateral iliac stents and bilateral lower extremity bypass  HPI: Marie Johnson is a 70 y.o. female with significant comorbidities as listed below that presents for 54-monthfollow-up for surveillance of her peripheral vascular disease.   She has somewhat complex history.  She initially had a left common femoral to above-knee popliteal bypass by Dr. EDonnetta Hutchingabout 20 years ago.  I initially saw her with bilateral lower extremity critical limb ischemia.  On 03/16/2020 she underwent left common and external iliac artery angioplasty with stent placement with a patent bypass that Dr. EDonnetta Hutchingpreviously performed.  At a later date on 03/18/2020, I performed right common femoral endarterectomy with retrograde right common iliac artery stenting and then a right common femoral to BK pop bypass with PTFE.  On follow-up today she has no new complaints.  She feels that her legs other than swelling are doing fairly well.  She is still smoking.  Past Medical History:  Diagnosis Date  . AAA (abdominal aortic aneurysm) (HCC)    3.4 cm  . Anemia   . Anxiety   . Aortic atherosclerosis ([place]   . Arthritis   . Asthma   . CAD (coronary artery disease)    a. Nonobstructive CAD by cath 07/17/13.  . CHD (congenital heart disease)    pt unaware???  . Chest pain    a. Adm 10-07/2013 - CTA neg for PE/dissection, cath with nonobstructive disease, CP ?due to uncontrolled HTN vs coronary vasospasm.  . CHF (congestive heart failure) ([place]   . COPD (chronic obstructive pulmonary disease) ([place]   . Coronary artery disease   . Echocardiogram    Echocardiogram 06/2019: EF 60-65, impaired relaxation (Gr 1 DD), mild MAC, mild MR, mild to mod aortic valve sclerosis (no AS), trivial TR, mild LAE, normal RVSF, RVSP 30.7 (mildly elevated)  . GERD (gastroesophageal reflux disease)   . Hyperlipidemia   . Hypertension   .  Neuropathy   . OA (osteoarthritis)   . Osteoporosis   . PAD (peripheral artery disease) ([place]    a. s/p L fem-pop bypass [facility]  . PAD (peripheral artery disease) ([place]   . Palpitations 02/22/2016  . Pneumonia   . Pre-diabetes   . Premature ventricular contraction   . PUD (peptic ulcer disease)   . PVD (peripheral vascular disease) ([place] 07/17/2013  . Tobacco abuse   . Wears dentures   . Wears glasses     Past Surgical History:  Procedure Laterality Date  . ABDOMINAL AORTOGRAM W/LOWER EXTREMITY N/A 03/09/2020   Procedure: ABDOMINAL AORTOGRAM W/LOWER EXTREMITY;  Surgeon: CMarty Heck MD;  [facility];  Service: Cardiovascular;  Laterality: N/A;  . ABDOMINAL HYSTERECTOMY    . BREAST SURGERY     left cyst excision x 2  . CARDIAC CATHETERIZATION     several years ago, nonobstructive, "50-60% blockages"  . ENDARTERECTOMY FEMORAL Right 03/18/2020   Procedure: ENDARTERECTOMY FEMORAL WITH PROFUNDAPLASTY;  Surgeon: CMarty Heck MD;  [place] [facility]  Service: Vascular;  Laterality: Right;  . FEMORAL-POPLITEAL BYPASS GRAFT Right 03/18/2020   Procedure: BYPASS GRAFT FEMORAL-POPLITEAL ARTERY using GORE PROPATEN VASCULAR GRAFT;  Surgeon: CMarty Heck MD;  [facility]  Service: Vascular;  Laterality: Right;  . HIP SURGERY     left - bars and screws placed  . HIP SURGERY    . INSERTION  OF ILIAC STENT Right 03/18/2020   Procedure: INSERTION OF GORE VIABAHN  ILIAC STENT;  Surgeon: [name], MD;  Location: [place];  Service: Vascular;  Laterality: Right;  . LEFT HEART CATHETERIZATION WITH CORONARY ANGIOGRAM N/A 07/17/2013   Procedure: LEFT HEART CATHETERIZATION WITH CORONARY ANGIOGRAM;  Surgeon: Larey Dresser, MD;  Location[facility] LAB;  Service: Cardiovascular;  Laterality: N/A;  . LOWER EXTREMITY ANGIOGRAM Right 03/18/2020   Procedure: RIGHT ILIAC ARTERIOGRAM WITH AN ILIAC STENT;  Surgeon:  [name], MD;  Location: [facility];  Service: Vascular;  Laterality: Right;  . MULTIPLE TOOTH EXTRACTIONS    . PATCH ANGIOPLASTY Right 03/18/2020   Procedure: PATCH ANGIOPLASTY using a [place] OF THE PROFUNDA;  Surgeon: [name], MD;  Location: [place];  Service: Vascular;  Laterality: Right;  . PERIPHERAL VASCULAR CATHETERIZATION N/A 07/16/2016   Procedure: Abdominal Aortogram w/Lower Extremity;  Surgeon: Rosetta Posner, MD;  Location: [place] CV LAB;  Service: Cardiovascular;  Laterality: N/A;  . PERIPHERAL VASCULAR INTERVENTION Left 03/09/2020   Procedure: PERIPHERAL VASCULAR INTERVENTION;  Surgeon: [name], MD;  Location: [place] CV LAB;  Service: Cardiovascular;  Laterality: Left;  common and external illiac   . ROTATOR CUFF REPAIR     right side, had torn ligaments as well  . TUBAL LIGATION    . VEIN BYPASS SURGERY     left leg  . WRIST ARTHROCENTESIS     left - broke in 3 places so has bars and screws placed    Family History  Problem Relation Age of Onset  . Cervical cancer Mother   . Heart disease Mother        MI in 52s  . Liver cancer Father   . Heart disease Father   . Lung cancer Maternal Aunt   . Prostate cancer Maternal Uncle        met. anal  . Breast cancer Maternal Grandmother   . Lung cancer Maternal Aunt   . Lung cancer Maternal Aunt   . Stomach cancer Maternal Aunt   . Colon cancer Maternal Aunt   . Lung cancer Maternal Uncle     SOCIAL HISTORY: Social History   Tobacco Use  . Smoking status: Current Every Day Smoker    Packs/day: 0.25    Years: 50.00    Pack years: 12.50    Types: Cigarettes  . Smokeless tobacco: Never Used  . Tobacco comment: 1-2 cigarettes Daily  Substance Use Topics  . Alcohol use: Never    Allergies  Allergen Reactions  . Bee Venom Anaphylaxis  . Bee Venom Anaphylaxis and Other (See Comments)    Welts, also  . Insect Extract Allergy Skin Test Anaphylaxis and Other (See  Comments)    Insect bites- Welts, also  . Nsaids Nausea Only and Other (See Comments)    Patient is not suppose to have this class of medication (liver)  . Latex Rash  . Latex Rash    Current Outpatient Medications  Medication Sig Dispense Refill  . albuterol (PROVENTIL HFA;VENTOLIN HFA) 108 (90 BASE) MCG/ACT inhaler Inhale 2 puffs into the lungs every 6 (six) hours as needed for wheezing.     [name] albuterol (PROVENTIL) (2.5 MG/3ML) 0.083% nebulizer solution Take 2.5 mg by nebulization every 6 (six) hours as needed for wheezing or shortness of breath.    [name] alendronate (FOSAMAX) 70 MG tablet Take 70 mg by mouth every Friday.     [name] allopurinol (ZYLOPRIM)  300 MG tablet Take 300 mg by mouth daily.     [name] aspirin EC 81 MG tablet Take 81 mg by mouth daily.     [name] atorvastatin (LIPITOR) 80 MG tablet Take 80 mg by mouth at bedtime.    . diphenhydrAMINE (BENADRYL) 25 MG tablet Take 50 mg by mouth at bedtime.    [name] EPIPEN 2-PAK 0.3 MG/0.3ML SOAJ injection Inject 0.3 mg into the muscle daily as needed. Use as directed as needed for allergies.    [name] ezetimibe (ZETIA) 10 MG tablet Take 1 tablet (10 mg total) by mouth daily. 90 tablet 3  . furosemide (LASIX) 40 MG tablet TAKE 1 TABLET BY MOUTH TWICE A DAY 180 tablet 3  . KLOR-CON M20 20 MEQ tablet TAKE 1 TABLET BY MOUTH EVERY DAY 90 tablet 3  . lactulose (CHRONULAC) 10 GM/15ML solution Take 20 g by mouth as needed for moderate constipation.     . Menthol-Methyl Salicylate (MUSCLE RUB) 10-15 % CREA Apply 1 application topically daily as needed (for arthritis pain).    . nitroGLYCERIN (NITROSTAT) 0.4 MG SL tablet Place 1 tablet (0.4 mg total) under the tongue every 5 (five) minutes x 3 doses as needed for chest pain. 25 tablet 4  . Oxycodone HCl 10 MG TABS Take 1 tablet (10 mg total) by mouth 4 (four) times daily as needed (for pain). 20 tablet 0  . pantoprazole (PROTONIX) 40 MG tablet Take 40 mg by mouth 2 (two) times daily.    [name] senna-docusate (SENOKOT-S)  8.6-50 MG tablet Take 2 tablets by mouth at bedtime.    [name] spironolactone (ALDACTONE) 25 MG tablet Take 1 tablet (25 mg total) by mouth daily. 90 tablet 3  . verapamil (CALAN-SR) 240 MG CR tablet Take 1 tablet (240 mg total) by mouth at bedtime. 90 tablet 3  . gabapentin (NEURONTIN) 100 MG capsule Take 100 mg by mouth daily. (Patient not taking: Reported on 01/17/2021)    . tiotropium (SPIRIVA HANDIHALER) 18 MCG inhalation capsule Place 1 capsule (18 mcg total) into inhaler and inhale daily. (Patient not taking: Reported on 01/17/2021) 30 capsule 12   No current facility-administered medications for this visit.   Facility-Administered Medications Ordered in Other Visits  Medication Dose Route Frequency Provider Last Rate Last Admin  . DOBUTamine (DOBUTREX) 1,000 mcg/mL in dextrose 5% 250 mL infusion  30 mcg/kg/min Intravenous Titrated Dorothy Spark, MD        REVIEW OF SYSTEMS:  _0  denotes positive finding, _1  denotes negative finding Cardiac  Comments:  Chest pain or chest pressure:    Shortness of breath upon exertion:    Short of breath when lying flat:    Irregular heart rhythm:        Vascular    Pain in calf, thigh, or hip brought on by ambulation:    Pain in feet at night that wakes you up from your sleep:     Blood clot in your veins:    Leg swelling:         Pulmonary    Oxygen at home:    Productive cough:     Wheezing:         Neurologic    Sudden weakness in arms or legs:     Sudden numbness in arms or legs:     Sudden onset of difficulty speaking or slurred speech:    Temporary loss of vision in one eye:     Problems with dizziness:  Gastrointestinal    Blood in stool:     Vomited blood:         Genitourinary    Burning when urinating:     Blood in urine:        Psychiatric    Major depression:         Hematologic    Bleeding problems:    Problems with blood clotting too easily:        Skin    Rashes or ulcers:        Constitutional     Fever or chills:      PHYSICAL EXAM: Vitals:   01/17/21 1108  BP: (!) 146/79  Pulse: 76  Resp: 14  Temp: 98.2 F (36.8 C)  TempSrc: Temporal  SpO2: 95%  Weight: 117 lb (53.1 kg)  Height: _0  (1.727 m)    GENERAL: The patient is a well-nourished female, in no acute distress. The vital signs are documented above. CARDIAC: There is a regular rate and rhythm.  VASCULAR:  Palpable femoral pulses bilaterally Right DP 1+ palpable Left pedal pulses nonpalpable No active tissue loss PULMONARY: No respiratory distress. ABDOMEN: Soft and non-tender. MUSCULOSKELETAL: There are no major deformities or cyanosis. NEUROLOGIC: No focal weakness or paresthesias are detected. SKIN: There are no ulcers or rashes noted. PSYCHIATRIC: The patient has a normal affect.  DATA:   Aortoiliac duplex today shows patent iliac stents bilaterally with no significant recurrent stenosis.  Right lower extremity arterial duplex shows a patent right leg bypass.  ABIs are 1.04 on the right biphasic and 0.83 on the left monophasic.  Assessment/Plan:  70 year old female with complex medical history that presents for interval follow-up of her previous vascular interventions for CLI.  She has bilateral common iliac stents as well as a left external iliac stent and also has a left common femoral to above-knee popliteal bypass with vein and a right common femoral to below-knee popliteal bypass with PTFE.  Based on duplex today her iliac stents and right leg bypass are all widely patent.  The left leg bypass was not imaged today but this was patent on arteriogram last year with no flow-limiting stenosis.  She has no new complaints.  I will see her in 6 months with aortoiliac duplex, ABIs, and bilateral lower extremity arterial duplex for continued surveillance.  Dicussed importance of smoking cessation.   [name], MD [facility] Office: [phone]

## 2021-01-19 ENCOUNTER — Other Ambulatory Visit: Payer: Self-pay

## 2021-01-19 DIAGNOSIS — I739 Peripheral vascular disease, unspecified: Secondary | ICD-10-CM

## 2021-01-31 NOTE — Progress Notes (Signed)
Cardiology Office Note:    Date:  5/78/4696   ID:  Marie Johnson, DOB [birth year], MRN [id number]  PCP:  Philmore Pali, NP   [facility] Providers Cardiologist:  Ena Dawley, MD (Inactive) Electrophysiologist:  Thompson Grayer, MD {  Referring MD: Philmore Pali, NP    History of Present Illness:    Marie Johnson is a 70 y.o. female with a hx of nonobstructive CAD, diastolic heart failure, PAD s/p L fem-pop bypass, L CIA and EIA stenting 02/2020, R CIA stent, RFA endartecetomy and right fem-pop bypass, AAA, COPD, HTN, HLD, and tobacco use who was previously followed by Dr. Meda Coffee who now returns to clinic for follow-up.  Last saw Marie Johnson on 07/22/21 where her lower extremity edema was improving after increasing lasix to 48m daily. No other changes made at that time.  Today, the patient states that she overall being feeling okay. She is suffering from seasonal allergies which has made her shortness of breath worse. LE edema significantly improved. Continues to have claudication but it is overall improved since her recent surgery. Able to walk about 1 "country" block before needing to stop to rest both because of shortness of breath and claudication. No chest pain, orthopnea, lightheadedness, dizziness, palpitations. Blood pressure is well controlled at home 120/70s at home. Did not take medication this AM, which is why blood pressure is elevated. Patient is trying to quit smoking but continues to smoke about 1/2 pack per day. Has not had lung cancer screening.   Past Medical History:  Diagnosis Date  . AAA (abdominal aortic aneurysm) (HCC)    3.4 cm  . Anemia   . Anxiety   . Aortic atherosclerosis ([facility]   . Arthritis   . Asthma   . CAD (coronary artery disease)    a. Nonobstructive CAD by cath 07/17/13.  . CHD (congenital heart disease)    pt unaware???  . Chest pain    a. Adm 10-07/2013 - CTA neg for PE/dissection, cath with nonobstructive disease, CP ?due to uncontrolled  HTN vs coronary vasospasm.  . CHF (congestive heart failure) ([place]   . COPD (chronic obstructive pulmonary disease) ([place]   . Coronary artery disease   . Echocardiogram    Echocardiogram 06/2019: EF 60-65, impaired relaxation (Gr 1 DD), mild MAC, mild MR, mild to mod aortic valve sclerosis (no AS), trivial TR, mild LAE, normal RVSF, RVSP 30.7 (mildly elevated)  . GERD (gastroesophageal reflux disease)   . Hyperlipidemia   . Hypertension   . Neuropathy   . OA (osteoarthritis)   . Osteoporosis   . PAD (peripheral artery disease) ([place]    a. s/p L fem-pop bypass [facility]  . PAD (peripheral artery disease) ([place]   . Palpitations 02/22/2016  . Pneumonia   . Pre-diabetes   . Premature ventricular contraction   . PUD (peptic ulcer disease)   . PVD (peripheral vascular disease) ([place] 07/17/2013  . Tobacco abuse   . Wears dentures   . Wears glasses     Past Surgical History:  Procedure Laterality Date  . ABDOMINAL AORTOGRAM W/LOWER EXTREMITY N/A 03/09/2020   Procedure: ABDOMINAL AORTOGRAM W/LOWER EXTREMITY;  Surgeon: CMarty Heck MD;  [facility];  Service: Cardiovascular;  Laterality: N/A;  . ABDOMINAL HYSTERECTOMY    . BREAST SURGERY     left cyst excision x 2  . CARDIAC CATHETERIZATION     several years ago, nonobstructive, "50-60% blockages"  .  ENDARTERECTOMY FEMORAL Right 03/18/2020   Procedure: ENDARTERECTOMY FEMORAL WITH PROFUNDAPLASTY;  Surgeon: [name], MD;  Location: [place];  Service: Vascular;  Laterality: Right;  . FEMORAL-POPLITEAL BYPASS GRAFT Right 03/18/2020   Procedure: BYPASS GRAFT FEMORAL-POPLITEAL ARTERY using GORE PROPATEN VASCULAR GRAFT;  Surgeon: [name], MD;  Location: [place];  Service: Vascular;  Laterality: Right;  . HIP SURGERY     left - bars and screws placed  . HIP SURGERY    . INSERTION OF ILIAC STENT Right 03/18/2020   Procedure: INSERTION OF GORE VIABAHN  ILIAC  STENT;  Surgeon: [name], MD;  Location: [place];  Service: Vascular;  Laterality: Right;  . LEFT HEART CATHETERIZATION WITH CORONARY ANGIOGRAM N/A 07/17/2013   Procedure: LEFT HEART CATHETERIZATION WITH CORONARY ANGIOGRAM;  Surgeon: Larey Dresser, MD;  Location: [facility];  Service: Cardiovascular;  Laterality: N/A;  . LOWER EXTREMITY ANGIOGRAM Right 03/18/2020   Procedure: RIGHT ILIAC ARTERIOGRAM WITH AN ILIAC STENT;  Surgeon: [name], MD;  Location: [place];  Service: Vascular;  Laterality: Right;  . MULTIPLE TOOTH EXTRACTIONS    . PATCH ANGIOPLASTY Right 03/18/2020   Procedure: PATCH ANGIOPLASTY using a [place] OF THE PROFUNDA;  Surgeon: [name], MD;  Location: [place];  Service: Vascular;  Laterality: Right;  . PERIPHERAL VASCULAR CATHETERIZATION N/A 07/16/2016   Procedure: Abdominal Aortogram w/Lower Extremity;  Surgeon: Rosetta Posner, MD;  Location: [place] [facility];  Service: Cardiovascular;  Laterality: N/A;  . PERIPHERAL VASCULAR INTERVENTION Left 03/09/2020   Procedure: PERIPHERAL VASCULAR INTERVENTION;  Surgeon: [name], MD;  Location: [place] [facility];  Service: Cardiovascular;  Laterality: Left;  common and external illiac   . ROTATOR CUFF REPAIR     right side, had torn ligaments as well  . TUBAL LIGATION    . VEIN BYPASS SURGERY     left leg  . WRIST ARTHROCENTESIS     left - broke in 3 places so has bars and screws placed    Current Medications: Current Meds  Medication Sig  . albuterol (PROVENTIL HFA;VENTOLIN HFA) 108 (90 BASE) MCG/ACT inhaler Inhale 2 puffs into the lungs every 6 (six) hours as needed for wheezing.   [name] albuterol (PROVENTIL) (2.5 MG/3ML) 0.083% nebulizer solution Take 2.5 mg by nebulization every 6 (six) hours as needed for wheezing or shortness of breath.  [name] alendronate (FOSAMAX) 70 MG tablet Take 70 mg by mouth every Friday.   [name] allopurinol (ZYLOPRIM) 300 MG tablet Take 300 mg by mouth  daily.   [name] aspirin EC 81 MG tablet Take 81 mg by mouth daily.   [name] atorvastatin (LIPITOR) 80 MG tablet Take 80 mg by mouth at bedtime.  . dapagliflozin propanediol (FARXIGA) 10 MG TABS tablet Take 1 tablet (10 mg total) by mouth daily before breakfast.  . diphenhydrAMINE (BENADRYL) 25 MG tablet Take 50 mg by mouth at bedtime.  [name] EPIPEN 2-PAK 0.3 MG/0.3ML SOAJ injection Inject 0.3 mg into the muscle daily as needed. Use as directed as needed for allergies.  [name] ezetimibe (ZETIA) 10 MG tablet Take 1 tablet (10 mg total) by mouth daily.  . furosemide (LASIX) 40 MG tablet TAKE 1 TABLET BY MOUTH TWICE A DAY  . KLOR-CON M20 20 MEQ tablet TAKE 1 TABLET BY MOUTH EVERY DAY  . lactulose (CHRONULAC) 10 GM/15ML solution Take 20 g by mouth as needed for moderate constipation.   . Menthol-Methyl Salicylate (MUSCLE RUB) 10-15 % CREA  Apply 1 application topically daily as needed (for arthritis pain).  . nitroGLYCERIN (NITROSTAT) 0.4 MG SL tablet Place 1 tablet (0.4 mg total) under the tongue every 5 (five) minutes x 3 doses as needed for chest pain.  . Oxycodone HCl 10 MG TABS Take 1 tablet (10 mg total) by mouth 4 (four) times daily as needed (for pain).  . pantoprazole (PROTONIX) 40 MG tablet Take 40 mg by mouth 2 (two) times daily.  [name] senna-docusate (SENOKOT-S) 8.6-50 MG tablet Take 2 tablets by mouth at bedtime.  [name] spironolactone (ALDACTONE) 25 MG tablet Take 1 tablet (25 mg total) by mouth daily.  . verapamil (CALAN-SR) 240 MG CR tablet Take 1 tablet (240 mg total) by mouth at bedtime.     Allergies:   Bee venom, Bee venom, Insect extract allergy skin test, Nsaids, Latex, and Latex   Social History   Socioeconomic History  . Marital status: Married    Spouse name: Not on file  . Number of children: Not on file  . Years of education: Not on file  . Highest education level: Not on file  Occupational History  . Not on file  Tobacco Use  . Smoking status: Current Every Day Smoker    Packs/day: 0.25     Years: 50.00    Pack years: 12.50    Types: Cigarettes  . Smokeless tobacco: Never Used  . Tobacco comment: 1-2 cigarettes Daily  Vaping Use  . Vaping Use: Never used  Substance and Sexual Activity  . Alcohol use: Never  . Drug use: Never  . Sexual activity: Not on file  Other Topics Concern  . Not on file  Social History Narrative   ** Merged History Encounter **       Pt lives in [place] with husband.  Retired Production designer, theatre/television/film of Radio broadcast assistant Strain: Not on [employer] Insecurity: Not on file  Transportation Needs: Not on file  Physical Activity: Not on file  Stress: Not on file  Social Connections: Not on file     Family History: The patient's family history includes Breast cancer in her maternal grandmother; Cervical cancer in her mother; Colon cancer in her maternal aunt; Heart disease in her father and mother; Liver cancer in her father; Lung cancer in her maternal aunt, maternal aunt, maternal aunt, and maternal uncle; Prostate cancer in her maternal uncle; Stomach cancer in her maternal aunt.  ROS:   Please see the history of present illness.    Review of Systems  Constitutional: Negative for chills and fever.  Respiratory: Positive for shortness of breath.   Cardiovascular: Positive for claudication and leg swelling. Negative for chest pain, palpitations, orthopnea and PND.  Gastrointestinal: Positive for heartburn. Negative for nausea and vomiting.  Genitourinary: Negative for dysuria.  Musculoskeletal: Positive for joint pain.  Neurological: Negative for dizziness and loss of consciousness.  Endo/Heme/Allergies: Negative for polydipsia.  Psychiatric/Behavioral: Negative for depression.    EKGs/Labs/Other Studies Reviewed:    The following studies were reviewed today: Echocardiogram 03/10/20 EF 55-60, inf HK, mild LVH, normal RVSF, trivial MR, AV sclerosis (no AS)  Echocardiogram 06/23/2019 EF 60-65, impaired relaxation,  mild MAC, mild MR, trivial AI, mild to moderate aortic sclerosis, no aortic stenosis, mild LAE, RVSP 30.7  Myoview 04/22/2019 EF 70, normal perfusion, low risk  Dobutamine stress echocardiogram 03/22/2016 Normal dobutamine echo . Ambiant PVCls at rest and with infusion  Echocardiogram 03/22/2016 Mild LVH, EF 60-65, normal wall motion,  grade 1 diastolic dysfunction, aortic sclerosis, trivial MR  Holter monitor 02/23/2016  Very frequent PVCs in pattern of bigeminy and trigeminy, total 41,000 PVCs in 48 hours.  Echocardiogram 07/18/2013 Mild to moderate LVH, EF 60, normal wall motion, mild LAE, normal RV SF  Cardiac catheterization 07/17/2013 LAD proximal 30-40 LCx ostial 40; OM1 30 ostial RCA proximal 30, mid 30  Myoview 07/16/2013 Normal stress nuclear study. LV Ejection Fraction: 73%   EKG:  EKG is  ordered today.  The ekg ordered today demonstrates with ventricular trigeminy HR 77.  Recent Labs: 07/01/2020: ALT 17; BUN 14; Creatinine, Ser 0.81; Hemoglobin 10.5; NT-Pro BNP 246; Platelets 361; Potassium 4.0; Sodium 134; TSH 0.964  Recent Lipid Panel    Component Value Date/Time   CHOL 92 03/19/2020 1234   CHOL 113 06/09/2019 0759   TRIG 111 03/19/2020 1234   HDL 29 (L) 03/19/2020 1234   HDL 45 06/09/2019 0759   CHOLHDL 3.2 03/19/2020 1234   VLDL 22 03/19/2020 1234   LDLCALC 41 03/19/2020 1234   LDLCALC 55 06/09/2019 0759     Physical Exam:    VS:  BP (!) 152/78   Pulse 77   Ht _0  (1.727 m)   Wt 117 lb 3.2 oz (53.2 kg)   SpO2 99%   BMI 17.82 kg/m     Wt Readings from Last 3 Encounters:  02/02/21 117 lb 3.2 oz (53.2 kg)  01/17/21 117 lb (53.1 kg)  07/22/20 126 lb 3.2 oz (57.2 kg)     GEN:  Comfortable, NAD HEENT: Normal NECK: No JVD; No carotid bruits CARDIAC: RRR, 1/6 systolic murmur. No rubs, gallops RESPIRATORY:  Diminished but clear ABDOMEN: Soft, non-tender, non-distended MUSCULOSKELETAL: Trace bilateral edema. Chronic venous stasis changes   SKIN: Warm and dry NEUROLOGIC:  Alert and oriented x 3 PSYCHIATRIC:  Normal affect   ASSESSMENT:    1. Coronary artery disease involving native coronary artery of native heart without angina pectoris   2. SOB (shortness of breath)   3. Smoker   4. Frequent PVCs   5. Essential hypertension   6. Other hyperlipidemia   7. Medication management   8. PAD (peripheral artery disease) ([place])   9. Chronic heart failure with preserved ejection fraction (HCC)   10. Tobacco abuse    PLAN:    In order of problems listed above:  #Chronic diastolic heart failure: TTE 03/10/20 with LVEF 55-60%, mild LVH, aortic sclerosis, normal RAP. Currently compensated with NYHA class IIb symptoms.  -Decrease lasix to 75m BID now that starting farxiga -Start farxiga 143mdaily -Continue spironolactone 2551maily -Check BMET today -Low Na diet  #Nonobstructive CAD: Has no known obstructive disease but worsening SOB recently. Has multiple possible etiologies of worsening dyspnea (COPD, diastolic heart failure, PVD), however, given significant risk of worsening CAD and abnormal WM in inferior wall on TTE 02/2020, will re-check lexiscan. -Check myoview given SOB and known CAD -Continue ASA 86m37mily -Continue lipitor 80mg74mly -Continue zetia 10mg 26my  #Frequent PVCs: In trigeminy currently. Previously well controlled on verapamil. -Repeat 3 day zio to reassess burden of PVCs -Check lexiscan as above -Continue verapamil 240mg d26m  #PAD: S/p PAD s/p L fem-pop bypass, L CIA and EIA stenting 02/2020, R CIA stent, RFA endartecetomy and right fem-pop bypass. Followed by Vascular Surgery. -Management per vascular surgery -Continue ASA 86mg da73m-Continue lipitor 80mg dai69mContinue zetia 10mg dail17mill need carotid ultrasound at next visit; declined today due to testing needed as above  #  AAA: Followed by Vascular surgery. -Continue ASA 7m daily -Continue lipitor 865mdaily -Continue zetia  1072maily  #HTN: -Continue verapamil 240m20mily -Continue spironolactone 25mg67mly  #HLD: -Continue ASA 81mg 33my -Continue lipitor 80mg d38m -Continue zetia 10mg da35m #Tobacco Abuse: Over 50 pack year smoking history with current tobacco use.  -CT chest lung cancer screening   Shared Decision Making/Informed Consent The risks [chest pain, shortness of breath, cardiac arrhythmias, dizziness, blood pressure fluctuations, myocardial infarction, stroke/transient ischemic attack, nausea, vomiting, allergic reaction, radiation exposure, metallic taste sensation and life-threatening complications (estimated to be 1 in 10,000)], benefits (risk stratification, diagnosing coronary artery disease, treatment guidance) and alternatives of a nuclear stress test were discussed in detail with Ms. Steinbach aBozard agrees to proceed.    Medication Adjustments/Labs and Tests Ordered: Current medicines are reviewed at length with the patient today.  Concerns regarding medicines are outlined above.  Orders Placed This Encounter  Procedures  . CT CHEST LUNG CA SCREEN LOW DOSE W/O CM  . Basic metabolic panel  . Lipid Profile  . Cardiac Stress Test: Informed Consent Details: Physician/Practitioner Attestation; Transcribe to consent form and obtain patient signature  . LONG TERM MONITOR (3-14 DAYS)  . MYOCARDIAL PERFUSION IMAGING  . EKG 12-Lead   Meds ordered this encounter  Medications  . dapagliflozin propanediol (FARXIGA) 10 MG TABS tablet    Sig: Take 1 tablet (10 mg total) by mouth daily before breakfast.    Dispense:  30 tablet    Refill:  3    Patient Instructions  Medication Instructions:   PLEASE TAKE YOUR LASIX TWICE DAILY ONLY, AS DR. PEMBERTOJohney Frame YOU TO.  START TAKING FARXIGA 10 MG BY MOUTH DAILY BEFORE BREAKFAST  *If you need a refill on your cardiac medications before your next appointment, please call your pharmacy*   Lab Work:  TODAY--BMET AND LIPIDS  If you  have labs (blood work) drawn today and your tests are completely normal, you will receive your results only by: . MyCharMarland Kitchen Message (if you have MyChart) OR . A paper copy in the mail If you have any lab test that is abnormal or we need to change your treatment, we will call you to review the results.   Testing/Procedures:  CT CHEST LUNG CA SCREEN LOW DOSE W/O CM TO BE DONE HERE IN THE OFFICE  Your physician has requested that you have a lexiscan myoview. For further information please visit [email or web address] follow instruction sheet, as given.   ZIO XT- Long Term Monitor Instructions   Your physician has requested you wear a ZIO patch monitor for 3___ days.  This is a single patch monitor.   [employer] supplies one patch monitor per enrollment. Additional stickers are not available. Please do not apply patch if you will be having a Nuclear Stress Test, Echocardiogram, Cardiac CT, MRI, or Chest Xray during the period you would be wearing the monitor. The patch cannot be worn during these tests. You cannot remove and re-apply the ZIO XT patch monitor.  Your ZIO patch monitor will be sent Fed Ex from [employer] to your home address. It may take 3-5 days to receive your monitor after you have been enrolled.  Once you have received your monitor, please review the enclosed instructions. Your monitor has already been registered assigning a specific monitor serial # to you.  Billing and Patient Assistance Program Information   We have supplied [employer] with any of your insurance information on file for billing  purposes. [employer] offers a sliding scale Patient Assistance Program for patients that do not have insurance, or whose insurance does not completely cover the cost of the ZIO monitor.   You must apply for the Patient Assistance Program to qualify for this discounted rate.     To apply, please call [employer] at [phone], select option 4, then select option 2, and ask to  apply for Patient Assistance Program.  Theodore Demark will ask your household income, and how many people are in your household.  They will quote your out-of-pocket cost based on that information.  [employer] will also be able to set up a 54-month interest-free payment plan if needed.  Applying the monitor   Shave hair from upper left chest.  Hold abrader disc by orange tab. Rub abrader in 40 strokes over the upper left chest as indicated in your monitor instructions.  Clean area with 4 enclosed alcohol pads. Let dry.  Apply patch as indicated in monitor instructions. Patch will be placed under collarbone on left side of chest with arrow pointing upward.  Rub patch adhesive wings for 2 minutes. Remove white label marked "1". Remove the white label marked "2". Rub patch adhesive wings for 2 additional minutes.  While looking in a mirror, press and release button in center of patch. A small green light will flash 3-4 times. This will be your only indicator that the monitor has been turned on. ?  Do not shower for the first 24 hours. You may shower after the first 24 hours.  Press the button if you feel a symptom. You will hear a small click. Record Date, Time and Symptom in the Patient Logbook.  When you are ready to remove the patch, follow instructions on the last 2 pages of the Patient Logbook. Stick patch monitor onto the last page of Patient Logbook.  Place Patient Logbook in the blue and white box.  Use locking tab on box and tape box closed securely.  The blue and white box has prepaid postage on it. Please place it in the mailbox as soon as possible. Your physician should have your test results approximately 7 days after the monitor has been [facility]  Call [place]at [phone] you have questions regarding your ZIO XT patch monitor. Call them immediately if you see an orange light blinking on your monitor.  If your monitor falls off in less than 4 days,  contact our Monitor department at [phone] ?If your monitor becomes loose or falls off after 4 days call [employer] at [phone] suggestions on securing your monitor.?   Follow-Up:  3 MONTHS IN THE OFFICE WITH AN EXTENDER PER DR. PJohney Frame      Signed, HFreada Bergeron MD  02/02/2021 10:39 AM    [place]

## 2021-02-02 ENCOUNTER — Telehealth: Payer: Self-pay | Admitting: *Deleted

## 2021-02-02 ENCOUNTER — Encounter: Payer: Self-pay | Admitting: Cardiology

## 2021-02-02 ENCOUNTER — Other Ambulatory Visit: Payer: Self-pay

## 2021-02-02 ENCOUNTER — Encounter: Payer: Self-pay | Admitting: *Deleted

## 2021-02-02 ENCOUNTER — Telehealth: Payer: Self-pay

## 2021-02-02 ENCOUNTER — Ambulatory Visit (INDEPENDENT_AMBULATORY_CARE_PROVIDER_SITE_OTHER): Payer: Medicare Other

## 2021-02-02 ENCOUNTER — Ambulatory Visit: Payer: Medicare Other | Admitting: Cardiology

## 2021-02-02 VITALS — BP 152/78 | HR 77 | Ht 68.0 in | Wt 117.2 lb

## 2021-02-02 DIAGNOSIS — I10 Essential (primary) hypertension: Secondary | ICD-10-CM

## 2021-02-02 DIAGNOSIS — Z720 Tobacco use: Secondary | ICD-10-CM

## 2021-02-02 DIAGNOSIS — I5032 Chronic diastolic (congestive) heart failure: Secondary | ICD-10-CM | POA: Diagnosis not present

## 2021-02-02 DIAGNOSIS — I493 Ventricular premature depolarization: Secondary | ICD-10-CM

## 2021-02-02 DIAGNOSIS — Z79899 Other long term (current) drug therapy: Secondary | ICD-10-CM | POA: Diagnosis not present

## 2021-02-02 DIAGNOSIS — I2510 Atherosclerotic heart disease of native coronary artery without angina pectoris: Secondary | ICD-10-CM | POA: Diagnosis not present

## 2021-02-02 DIAGNOSIS — E7849 Other hyperlipidemia: Secondary | ICD-10-CM

## 2021-02-02 DIAGNOSIS — F17200 Nicotine dependence, unspecified, uncomplicated: Secondary | ICD-10-CM

## 2021-02-02 DIAGNOSIS — I739 Peripheral vascular disease, unspecified: Secondary | ICD-10-CM | POA: Diagnosis not present

## 2021-02-02 DIAGNOSIS — R0602 Shortness of breath: Secondary | ICD-10-CM

## 2021-02-02 LAB — BASIC METABOLIC PANEL
BUN/Creatinine Ratio: 29 — ABNORMAL HIGH (ref 12–28)
BUN: 18 mg/dL (ref 8–27)
CO2: 22 mmol/L (ref 20–29)
Calcium: 9.3 mg/dL (ref 8.7–10.3)
Chloride: 88 mmol/L — ABNORMAL LOW (ref 96–106)
Creatinine, Ser: 0.62 mg/dL (ref 0.57–1.00)
Glucose: 92 mg/dL (ref 65–99)
Potassium: 4.3 mmol/L (ref 3.5–5.2)
Sodium: 122 mmol/L — ABNORMAL LOW (ref 134–144)
eGFR: 95 mL/min/{1.73_m2} (ref >59)

## 2021-02-02 LAB — LIPID PANEL
Chol/HDL Ratio: 2.6 ratio (ref 0.0–4.4)
Cholesterol, Total: 125 mg/dL (ref 100–199)
HDL: 49 mg/dL (ref >39)
LDL Chol Calc (NIH): 62 mg/dL (ref 0–99)
Triglycerides: 66 mg/dL (ref 0–149)
VLDL Cholesterol Cal: 14 mg/dL (ref 5–40)

## 2021-02-02 MED ORDER — DAPAGLIFLOZIN PROPANEDIOL 10 MG PO TABS: 10.0000 mg | ORAL_TABLET | Freq: Every day | ORAL | 3 refills | Status: DC

## 2021-02-02 NOTE — Telephone Encounter (Signed)
**Note De-Identified Stephinie Battisti Obfuscation** I started a [place] PA through covermymeds and received the following message: Alaycia Eardley Key: [id number] - PA Case ID: [id number] Outcome: This medication or product is on your plan's list of covered drugs. Prior authorization is not required at this time.  Drug: Wilder Glade 10MG  tablets [employer] Medicare Part D Electronic Prior Authorization Form (2017 NCPDP)

## 2021-02-02 NOTE — Progress Notes (Unsigned)
Patient enrolled for [employer] to ship a 3 day ZIO XT long term holter monitor to her home. 

## 2021-02-02 NOTE — Telephone Encounter (Signed)
-----   Message from Allyn, LPN sent at 1/60/7371  9:59 AM EDT ----- Regarding: RE: new start Farxiga I have done this PA, no PA required. Thanks, Jeani Hawking ----- Message ----- From: Nuala Alpha, LPN Sent: 0/62/6948   9:18 AM EDT To: Deliah Boston Via, LPN, Leeroy Bock, RPH-CPP, # Subject: new start Wilder Glade                              This pt was started on Farxiga 10 mg today.  Cathy gave samples while in clinic. Not sure if this will need further pt assistance or PA.  Could you help if so?  Thanks a bunch, [employer]

## 2021-02-02 NOTE — Telephone Encounter (Addendum)
**Note De-identified Meila Berke Obfuscation** -----  **Note De-Identified Brave Dack Obfuscation** Message from Nuala Alpha, LPN sent at 1/85/6314  9:17 AM EDT ----- Regarding: new start Farxiga This pt was started on Farxiga 10 mg today.  Cathy gave samples while in clinic. Not sure if this will need further pt assistance or PA.

## 2021-02-02 NOTE — Patient Instructions (Signed)
Medication Instructions:   PLEASE TAKE YOUR LASIX TWICE DAILY ONLY, AS DR. Johney Frame ADVISED YOU TO.  START TAKING FARXIGA 10 MG BY MOUTH DAILY BEFORE BREAKFAST  *If you need a refill on your cardiac medications before your next appointment, please call your pharmacy*   Lab Work:  TODAY--BMET AND LIPIDS  If you have labs (blood work) drawn today and your tests are completely normal, you will receive your results only by: [name] MyChart Message (if you have MyChart) OR . A paper copy in the mail If you have any lab test that is abnormal or we need to change your treatment, we will call you to review the results.   Testing/Procedures:  CT CHEST LUNG CA SCREEN LOW DOSE W/O CM TO BE DONE HERE IN THE OFFICE  Your physician has requested that you have a lexiscan myoview. For further information please visit [email or web address]. Please follow instruction sheet, as given.   ZIO XT- Long Term Monitor Instructions   Your physician has requested you wear a ZIO patch monitor for 3___ days.  This is a single patch monitor.   [employer] supplies one patch monitor per enrollment. Additional stickers are not available. Please do not apply patch if you will be having a Nuclear Stress Test, Echocardiogram, Cardiac CT, MRI, or Chest Xray during the period you would be wearing the monitor. The patch cannot be worn during these tests. You cannot remove and re-apply the ZIO XT patch monitor.  Your ZIO patch monitor will be sent Fed Ex from [employer] directly to your home address. It may take 3-5 days to receive your monitor after you have been enrolled.  Once you have received your monitor, please review the enclosed instructions. Your monitor has already been registered assigning a specific monitor serial # to you.  Billing and Patient Assistance Program Information   We have supplied [employer] with any of your insurance information on file for billing purposes. [employer] offers a sliding scale Patient  Assistance Program for patients that do not have insurance, or whose insurance does not completely cover the cost of the ZIO monitor.   You must apply for the Patient Assistance Program to qualify for this discounted rate.     To apply, please call [employer] at [phone], select option 4, then select option 2, and ask to apply for Patient Assistance Program.  Theodore Demark will ask your household income, and how many people are in your household.  They will quote your out-of-pocket cost based on that information.  [employer] will also be able to set up a 75-month, interest-free payment plan if needed.  Applying the monitor   Shave hair from upper left chest.  Hold abrader disc by orange tab. Rub abrader in 40 strokes over the upper left chest as indicated in your monitor instructions.  Clean area with 4 enclosed alcohol pads. Let dry.  Apply patch as indicated in monitor instructions. Patch will be placed under collarbone on left side of chest with arrow pointing upward.  Rub patch adhesive wings for 2 minutes. Remove white label marked "1". Remove the white label marked "2". Rub patch adhesive wings for 2 additional minutes.  While looking in a mirror, press and release button in center of patch. A small green light will flash 3-4 times. This will be your only indicator that the monitor has been turned on. ?  Do not shower for the first 24 hours. You may shower after the first 24 hours.  Press the button if you feel  a symptom. You will hear a small click. Record Date, Time and Symptom in the Patient Logbook.  When you are ready to remove the patch, follow instructions on the last 2 pages of the Patient Logbook. Stick patch monitor onto the last page of Patient Logbook.  Place Patient Logbook in the blue and white box.  Use locking tab on box and tape box closed securely.  The blue and white box has prepaid postage on it. Please place it in the mailbox as soon as possible. Your physician should have your test  results approximately 7 days after the monitor has been mailed back to [facility].  Call [facility] at [phone] if you have questions regarding your ZIO XT patch monitor. Call them immediately if you see an orange light blinking on your monitor.  If your monitor falls off in less than 4 days, contact our Monitor department at [phone]. ?If your monitor becomes loose or falls off after 4 days call [employer] at [phone] for suggestions on securing your monitor.?   Follow-Up:  3 MONTHS IN THE OFFICE WITH AN EXTENDER PER DR. Johney Frame

## 2021-02-03 ENCOUNTER — Telehealth: Payer: Self-pay | Admitting: *Deleted

## 2021-02-03 DIAGNOSIS — Z79899 Other long term (current) drug therapy: Secondary | ICD-10-CM

## 2021-02-03 DIAGNOSIS — I5032 Chronic diastolic (congestive) heart failure: Secondary | ICD-10-CM

## 2021-02-03 DIAGNOSIS — E871 Hypo-osmolality and hyponatremia: Secondary | ICD-10-CM

## 2021-02-03 DIAGNOSIS — Z0189 Encounter for other specified special examinations: Secondary | ICD-10-CM

## 2021-02-03 MED ORDER — FUROSEMIDE 40 MG PO TABS: 40.0000 mg | ORAL_TABLET | Freq: Every day | ORAL | 0 refills | Status: DC

## 2021-02-03 NOTE — Telephone Encounter (Signed)
-----   Message from Freada Bergeron, MD sent at 02/03/2021  8:48 AM EDT ----- Her sodium is low at 122. Unclear reason as to why. Does she have a history of low sodium in the past? For now, lets have her decrease her lasix to 40mg  daily for 5 days and then repeat BMET next week to verify that this is a true value. Let me know if her swelling is worse.   If her sodium remains persistently low on repeat labs next week, we should get her in with nephrology to check urine studies.

## 2021-02-03 NOTE — Telephone Encounter (Signed)
Pt made aware of lab results and recommendations per Dr. Johney Frame. Advised the pt to decrease her lasix to taking 40 mg po daily for 5 days and come in for repeat labs to check a BMET and PRO-BNP next week. Advised the pt to keep [place] posted in the interim if her swelling worsens with decreased lasix to po daily. Advised the pt that she should be eating a nutritious diet, including getting enough healthy protein in her diet. Asked pt if she has a history of low sodium and she said she does, as well as her mother had the same issue when she was alive.  Pt states past MD's advised her to just increase the salt in her diet.  Scheduled the pt to come in for repeat labs on next Wednesday 5/25 to check BMET and PRO-BNP.  Will make Dr. Johney Frame aware of pt history of low sodium level.  Pt verbalized understanding and agrees with this plan.   Dr. Johney Frame made aware of pts history with low sodium.  She should follow all recommendations as mentioned above, which pt is aware of.

## 2021-02-08 ENCOUNTER — Other Ambulatory Visit: Payer: Medicare Other | Admitting: *Deleted

## 2021-02-08 ENCOUNTER — Other Ambulatory Visit: Payer: Self-pay

## 2021-02-08 DIAGNOSIS — I5032 Chronic diastolic (congestive) heart failure: Secondary | ICD-10-CM | POA: Diagnosis not present

## 2021-02-08 DIAGNOSIS — Z79899 Other long term (current) drug therapy: Secondary | ICD-10-CM

## 2021-02-08 DIAGNOSIS — E871 Hypo-osmolality and hyponatremia: Secondary | ICD-10-CM | POA: Diagnosis not present

## 2021-02-08 DIAGNOSIS — Z0189 Encounter for other specified special examinations: Secondary | ICD-10-CM

## 2021-02-09 LAB — PRO B NATRIURETIC PEPTIDE: NT-Pro BNP: 145 pg/mL (ref 0–301)

## 2021-02-09 LAB — BASIC METABOLIC PANEL
BUN/Creatinine Ratio: 22 (ref 12–28)
BUN: 18 mg/dL (ref 8–27)
CO2: 23 mmol/L (ref 20–29)
Calcium: 9 mg/dL (ref 8.7–10.3)
Chloride: 94 mmol/L — ABNORMAL LOW (ref 96–106)
Creatinine, Ser: 0.81 mg/dL (ref 0.57–1.00)
Glucose: 91 mg/dL (ref 65–99)
Potassium: 3.6 mmol/L (ref 3.5–5.2)
Sodium: 133 mmol/L — ABNORMAL LOW (ref 134–144)
eGFR: 78 mL/min/{1.73_m2} (ref >59)

## 2021-02-14 ENCOUNTER — Telehealth (HOSPITAL_COMMUNITY): Payer: Self-pay | Admitting: *Deleted

## 2021-02-14 DIAGNOSIS — G5790 Unspecified mononeuropathy of unspecified lower limb: Secondary | ICD-10-CM | POA: Diagnosis not present

## 2021-02-14 DIAGNOSIS — R7303 Prediabetes: Secondary | ICD-10-CM | POA: Diagnosis not present

## 2021-02-14 DIAGNOSIS — G8929 Other chronic pain: Secondary | ICD-10-CM | POA: Diagnosis not present

## 2021-02-14 DIAGNOSIS — Z79899 Other long term (current) drug therapy: Secondary | ICD-10-CM | POA: Diagnosis not present

## 2021-02-14 DIAGNOSIS — I739 Peripheral vascular disease, unspecified: Secondary | ICD-10-CM | POA: Diagnosis not present

## 2021-02-14 DIAGNOSIS — M62838 Other muscle spasm: Secondary | ICD-10-CM | POA: Diagnosis not present

## 2021-02-14 DIAGNOSIS — I5032 Chronic diastolic (congestive) heart failure: Secondary | ICD-10-CM | POA: Diagnosis not present

## 2021-02-14 DIAGNOSIS — I714 Abdominal aortic aneurysm, without rupture: Secondary | ICD-10-CM | POA: Diagnosis not present

## 2021-02-14 DIAGNOSIS — M549 Dorsalgia, unspecified: Secondary | ICD-10-CM | POA: Diagnosis not present

## 2021-02-14 DIAGNOSIS — E559 Vitamin D deficiency, unspecified: Secondary | ICD-10-CM | POA: Diagnosis not present

## 2021-02-14 DIAGNOSIS — J449 Chronic obstructive pulmonary disease, unspecified: Secondary | ICD-10-CM | POA: Diagnosis not present

## 2021-02-14 NOTE — Telephone Encounter (Signed)
Patient given detailed instructions per Myocardial Perfusion Study Information Sheet for the test on 02/17/2021 at 7:30. Patient notified to arrive 15 minutes early and that it is imperative to arrive on time for appointment to keep from having the test rescheduled.  If you need to cancel or reschedule your appointment, please call the office within 24 hours of your appointment. . Patient verbalized understanding.Marie Johnson

## 2021-02-17 ENCOUNTER — Ambulatory Visit (HOSPITAL_COMMUNITY): Payer: Medicare Other | Attending: Cardiovascular Disease

## 2021-02-17 ENCOUNTER — Other Ambulatory Visit: Payer: Self-pay

## 2021-02-17 ENCOUNTER — Inpatient Hospital Stay: Admission: RE | Admit: 2021-02-17 | Payer: Medicare Other | Source: Ambulatory Visit

## 2021-02-17 ENCOUNTER — Telehealth: Payer: Self-pay

## 2021-02-17 ENCOUNTER — Ambulatory Visit (INDEPENDENT_AMBULATORY_CARE_PROVIDER_SITE_OTHER)
Admission: RE | Admit: 2021-02-17 | Discharge: 2021-02-17 | Disposition: A | Discharge status: Home / Self Care | Payer: Medicare Other | Source: Ambulatory Visit | Attending: Cardiology | Admitting: Cardiology

## 2021-02-17 DIAGNOSIS — Z122 Encounter for screening for malignant neoplasm of respiratory organs: Secondary | ICD-10-CM

## 2021-02-17 DIAGNOSIS — R0602 Shortness of breath: Secondary | ICD-10-CM

## 2021-02-17 DIAGNOSIS — F17210 Nicotine dependence, cigarettes, uncomplicated: Secondary | ICD-10-CM | POA: Diagnosis not present

## 2021-02-17 DIAGNOSIS — F17200 Nicotine dependence, unspecified, uncomplicated: Secondary | ICD-10-CM

## 2021-02-17 DIAGNOSIS — I2510 Atherosclerotic heart disease of native coronary artery without angina pectoris: Secondary | ICD-10-CM | POA: Diagnosis not present

## 2021-02-17 DIAGNOSIS — Z87891 Personal history of nicotine dependence: Secondary | ICD-10-CM | POA: Diagnosis not present

## 2021-02-17 LAB — MYOCARDIAL PERFUSION IMAGING
LV dias vol: 56 mL (ref 46–106)
LV sys vol: 16 mL
Peak HR: 97 {beats}/min
Rest HR: 80 {beats}/min
SDS: 2
SRS: 3
SSS: 5
TID: 0.89

## 2021-02-17 MED ORDER — TECHNETIUM TC 99M TETROFOSMIN IV KIT
31.4000 | PACK | Freq: Once | INTRAVENOUS | Status: AC | PRN
Start: 2021-02-17 — End: 2021-02-17
  Administered 2021-02-17: 31.4 via INTRAVENOUS
  Filled 2021-02-17: qty 32

## 2021-02-17 MED ORDER — TECHNETIUM TC 99M TETROFOSMIN IV KIT
10.2000 | PACK | Freq: Once | INTRAVENOUS | Status: AC | PRN
Start: 2021-02-17 — End: 2021-02-17
  Administered 2021-02-17: 10.2 via INTRAVENOUS
  Filled 2021-02-17: qty 11

## 2021-02-17 MED ORDER — REGADENOSON 0.4 MG/5ML IV SOLN
0.4000 mg | Freq: Once | INTRAVENOUS | Status: AC
  Administered 2021-02-17: 0.4 mg via INTRAVENOUS

## 2021-02-17 NOTE — Telephone Encounter (Signed)
-----   Message from Freada Bergeron, MD sent at 02/17/2021  3:31 PM EDT ----- Her lung scan shows no suspicious lesions, which is great news! It did show a mildly dilated aorta which we will just monitor with her yearly CT scans to ensure it remains stable. She should continue the aspirin and lipitor for the calcium in her aorta and heart arteries to prevent the plaque from progressing.

## 2021-02-17 NOTE — Telephone Encounter (Signed)
Attempted phone call to pt.  Left voicemail message to contact triage at [phone]. 

## 2021-02-19 DIAGNOSIS — I493 Ventricular premature depolarization: Secondary | ICD-10-CM

## 2021-02-20 NOTE — Telephone Encounter (Signed)
Returned call to patient about results of test.  Lung scan shows no suspicious lesions and a mildly dilated aorta that we will continue to monitor.  Patient advised to continue the aspirin and Lipitor.  Patient verbally acknowledged instructions and appreciative of call.[name]

## 2021-02-20 NOTE — Telephone Encounter (Signed)
Follow up:    Patient returning a call back concerning her results.

## 2021-02-28 DIAGNOSIS — I493 Ventricular premature depolarization: Secondary | ICD-10-CM | POA: Diagnosis not present

## 2021-03-09 DIAGNOSIS — E785 Hyperlipidemia, unspecified: Secondary | ICD-10-CM | POA: Diagnosis not present

## 2021-03-09 DIAGNOSIS — Z0000 Encounter for general adult medical examination without abnormal findings: Secondary | ICD-10-CM | POA: Diagnosis not present

## 2021-03-09 DIAGNOSIS — Z9181 History of falling: Secondary | ICD-10-CM | POA: Diagnosis not present

## 2021-03-09 DIAGNOSIS — Z139 Encounter for screening, unspecified: Secondary | ICD-10-CM | POA: Diagnosis not present

## 2021-03-16 DIAGNOSIS — G8929 Other chronic pain: Secondary | ICD-10-CM | POA: Diagnosis not present

## 2021-03-16 DIAGNOSIS — M62838 Other muscle spasm: Secondary | ICD-10-CM | POA: Diagnosis not present

## 2021-03-16 DIAGNOSIS — I739 Peripheral vascular disease, unspecified: Secondary | ICD-10-CM | POA: Diagnosis not present

## 2021-03-16 DIAGNOSIS — M549 Dorsalgia, unspecified: Secondary | ICD-10-CM | POA: Diagnosis not present

## 2021-03-16 DIAGNOSIS — Z681 Body mass index (BMI) 19 or less, adult: Secondary | ICD-10-CM | POA: Diagnosis not present

## 2021-03-16 DIAGNOSIS — Z79899 Other long term (current) drug therapy: Secondary | ICD-10-CM | POA: Diagnosis not present

## 2021-04-14 DIAGNOSIS — H6591 Unspecified nonsuppurative otitis media, right ear: Secondary | ICD-10-CM | POA: Diagnosis not present

## 2021-04-14 DIAGNOSIS — I10 Essential (primary) hypertension: Secondary | ICD-10-CM | POA: Diagnosis not present

## 2021-04-14 DIAGNOSIS — Z681 Body mass index (BMI) 19 or less, adult: Secondary | ICD-10-CM | POA: Diagnosis not present

## 2021-04-14 DIAGNOSIS — M549 Dorsalgia, unspecified: Secondary | ICD-10-CM | POA: Diagnosis not present

## 2021-04-14 DIAGNOSIS — G8929 Other chronic pain: Secondary | ICD-10-CM | POA: Diagnosis not present

## 2021-04-14 DIAGNOSIS — I739 Peripheral vascular disease, unspecified: Secondary | ICD-10-CM | POA: Diagnosis not present

## 2021-04-14 DIAGNOSIS — K5900 Constipation, unspecified: Secondary | ICD-10-CM | POA: Diagnosis not present

## 2021-04-25 NOTE — Progress Notes (Signed)
Cardiology Office Note:    Date:  11/12/3333   ID:  Marie Johnson, DOB [birth year], MRN [id number]  PCP:  Marie Pali, NP   [facility] Providers Cardiologist:  Marie Bergeron, MD Cardiology APP:  Marie Shi, PA-C  Electrophysiologist:  Marie Grayer, MD      Referring MD: Marie Pali, NP   Chief Complaint:  Follow-up (CHF, CAD)    Patient Profile:    Marie Johnson is a 70 y.o. 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  Predominant rhythm was NSR with average HR 92 (ranging from 59-150bpm)  There were 3 runs of SVT with the longest lasting 7 beats at average rate of 126bpm; fastest was 7 beats at rate 150bpm  Patient triggered events mainly correlated with sinus tachycardia, PACs and PVCs  Rare SVEs (<1%) and rare PVCs (<1%)  Chest CT 02/17/21 4.4 cm distal descending thoracic aortic aneurysm.  Aortic Atherosclerosis   GATED SPECT MYO PERF W/LEXISCAN STRESS 1D 02/17/2021 1.  Normal MPI study without evidence of ischemia or infarction. 2.  Normal left ventricular function, EF> 65%. 3.  This is a low risk study.  Echocardiogram 03/10/20 EF 55-60,  inf HK, mild LVH, normal RVSF, trivial MR, AV sclerosis (no AS)   Holter monitor 02/23/2016 Very frequent PVCs in pattern of bigeminy and trigeminy, total 41,000 PVCs in 48 hours.   Cardiac catheterization 07/17/2013 LAD proximal 30-40 LCx ostial 40; OM1 30 ostial RCA proximal 30, mid 30   History of Present Illness: Marie Johnson was last seen by Dr. Johney Frame in 5/22.  She was started on Dapagliflozin and her Furosemide was reduced.  She had symptoms of worsening shortness of breath.  A Myoview was obtained and was low risk.  She had a lot of PVCs on her EKG and a 3 day Zio showed NSR and < 1% PVCs.  Given her ongoing tobacco abuse, she was set up with a Chest CT for lung CA screening. This was neg for CA but did show a 44 mm thoracic aortic aneurysm.  She returns for f/u.  She is here alone.  Her breathing is overall stable.  She does have a lot of issues with constipation.  If she strains hard, she will get chest pain.  Otherwise, she denies chest discomfort.  She has not had syncope.  She sleeps on 2-3 pillows chronically.  Her lower extremity edema is improved.        Past Medical History:  Diagnosis Date   AAA (abdominal aortic aneurysm) (HCC)    3.4 cm   Anemia    Anxiety    Aortic atherosclerosis (HCC)    Arthritis    Asthma    CAD (coronary artery disease)  a. Nonobstructive CAD by cath 07/17/13.   CHD (congenital heart disease)    pt unaware???   Chest pain    a. Adm 10-07/2013 - CTA neg for PE/dissection, cath with nonobstructive disease, CP ?due to uncontrolled HTN vs coronary vasospasm.   CHF (congestive heart failure) (HCC)    COPD (chronic obstructive pulmonary disease) (HCC)    Coronary artery disease    Echocardiogram    Echocardiogram 06/2019: EF 60-65, impaired relaxation (Gr 1 DD), mild MAC, mild MR, mild to mod aortic valve sclerosis (no AS), trivial TR, mild LAE, normal RVSF, RVSP 30.7 (mildly elevated)   GERD (gastroesophageal reflux disease)    Hyperlipidemia     Hypertension    Neuropathy    OA (osteoarthritis)    Osteoporosis    PAD (peripheral artery disease) (HCC)    a. s/p L fem-pop bypass b. recent evaluation at [facility], unamenable to intervention   PAD (peripheral artery disease) ([place])    Palpitations 02/22/2016   Pneumonia    Pre-diabetes    Premature ventricular contraction    PUD (peptic ulcer disease)    PVD (peripheral vascular disease) ([place]) 07/17/2013   Tobacco abuse    Wears dentures    Wears glasses     Current Medications: Current Meds  Medication Sig   albuterol (PROVENTIL HFA;VENTOLIN HFA) 108 (90 BASE) MCG/ACT inhaler Inhale 2 puffs into the lungs every 6 (six) hours as needed for wheezing.    albuterol (PROVENTIL) (2.5 MG/3ML) 0.083% nebulizer solution Take 2.5 mg by nebulization every 6 (six) hours as needed for wheezing or shortness of breath.   alendronate (FOSAMAX) 70 MG tablet Take 70 mg by mouth every Friday.    allopurinol (ZYLOPRIM) 300 MG tablet Take 300 mg by mouth daily.    aspirin EC 81 MG tablet Take 81 mg by mouth daily.    atorvastatin (LIPITOR) 80 MG tablet Take 80 mg by mouth at bedtime.   dapagliflozin propanediol (FARXIGA) 10 MG TABS tablet Take 1 tablet (10 mg total) by mouth daily before breakfast.   diphenhydrAMINE (BENADRYL) 25 MG tablet Take 50 mg by mouth at bedtime.   EPIPEN 2-PAK 0.3 MG/0.3ML SOAJ injection Inject 0.3 mg into the muscle daily as needed. Use as directed as needed for allergies.   furosemide (LASIX) 40 MG tablet Take 1 tablet (40 mg total) by mouth daily. For 5 days only then repeat labs.   KLOR-CON M20 20 MEQ tablet TAKE 1 TABLET BY MOUTH EVERY DAY   lactulose (CHRONULAC) 10 GM/15ML solution Take 20 g by mouth as needed for moderate constipation.    Menthol-Methyl Salicylate (MUSCLE RUB) 10-15 % CREA Apply 1 application topically daily as needed (for arthritis pain).   nitroGLYCERIN (NITROSTAT) 0.4 MG SL tablet Place 1 tablet (0.4 mg total) under the tongue every 5 (five) minutes  x 3 doses as needed for chest pain.   Oxycodone HCl 10 MG TABS Take 1 tablet (10 mg total) by mouth 4 (four) times daily as needed (for pain).   pantoprazole (PROTONIX) 40 MG tablet Take 40 mg by mouth 2 (two) times daily.   senna-docusate (SENOKOT-S) 8.6-50 MG tablet Take 2 tablets by mouth at bedtime.   spironolactone (ALDACTONE) 25 MG tablet Take 1 tablet (25 mg total) by mouth daily.   verapamil (CALAN-SR) 240 MG CR tablet Take 1 tablet (240 mg total) by mouth at bedtime.   [DISCONTINUED] ezetimibe (ZETIA) 10 MG tablet Take 1 tablet (10 mg total) by mouth daily.  Allergies:   Bee venom, Bee venom, Insect extract allergy skin test, Nsaids, Latex, and Latex   Social History   Tobacco Use   Smoking status: Every Day    Packs/day: 0.25    Years: 50.00    Pack years: 12.50    Types: Cigarettes   Smokeless tobacco: Never   Tobacco comments:    1-2 cigarettes Daily  Vaping Use   Vaping Use: Never used  Substance Use Topics   Alcohol use: Never   Drug use: Never     Family Hx: The patient's family history includes Breast cancer in her maternal grandmother; Cervical cancer in her mother; Colon cancer in her maternal aunt; Heart disease in her father and mother; Liver cancer in her father; Lung cancer in her maternal aunt, maternal aunt, maternal aunt, and maternal uncle; Prostate cancer in her maternal uncle; Stomach cancer in her maternal aunt.  Review of Systems  Skin:  Negative for rash.  Gastrointestinal:  Positive for constipation. Negative for hematochezia.  Genitourinary:  Negative for dysuria, hematuria and urgency.    EKGs/Labs/Other Test Reviewed:    EKG:  EKG is not ordered today.  The ekg ordered today demonstrates n/a  Recent Labs: 07/01/2020: ALT 17; Hemoglobin 10.5; Platelets 361; TSH 0.964 02/08/2021: BUN 18; Creatinine, Ser 0.81; NT-Pro BNP 145; Potassium 3.6; Sodium 133   Recent Lipid Panel Lab Results  Component Value Date/Time   CHOL 125 02/02/2021  09:47 AM   TRIG 66 02/02/2021 09:47 AM   HDL 49 02/02/2021 09:47 AM   LDLCALC 62 02/02/2021 09:47 AM      Risk Assessment/Calculations:      Physical Exam:    VS:  BP 120/70   Pulse 88   Ht _0  (1.727 m)   Wt 110 lb 9.6 oz (50.2 kg)   SpO2 95%   BMI 16.82 kg/m     Wt Readings from Last 3 Encounters:  04/26/21 110 lb 9.6 oz (50.2 kg)  02/17/21 117 lb (53.1 kg)  02/02/21 117 lb 3.2 oz (53.2 kg)     Constitutional:      Appearance: Healthy appearance. Not in distress.  Neck:     Vascular: JVD normal.  Pulmonary:     Effort: Pulmonary effort is normal.     Breath sounds: No wheezing. No rales.  Cardiovascular:     Normal rate. Regular rhythm. Normal S1. Normal S2.      Murmurs: There is no murmur.  Edema:    Peripheral edema present.    Ankle: bilateral trace edema of the ankle. Abdominal:     General: There is distension.     Palpations: Abdomen is soft.  Skin:    General: Skin is warm and dry.  Neurological:     Mental Status: Alert and oriented to person, place and time.     Cranial Nerves: Cranial nerves are intact.       ASSESSMENT & PLAN:    1. Chronic heart failure with preserved ejection fraction (HCC) EF 55-60 by echocardiogram 6/21.  Her dyspnea is multifactorial mainly related to COPD from ongoing tobacco abuse.  Overall, volume status is stable.  She is NYHA IIb-III.  Her blood pressure is well controlled.  Therefore, I do not think she needs Entresto added to her medical regimen.  Continue current dose of furosemide, spironolactone, dapagliflozin.  Follow-up in 6 months.  2. Coronary artery disease involving native coronary artery of native heart without angina pectoris Nonobstructive coronary disease by cardiac catheterization in  2014.  Myoview in 6/22 was low risk.  Overall, she is doing well without anginal symptoms.  Continue aspirin, atorvastatin.  3. Thoracic aortic aneurysm without rupture (HCC) 4.4 cm by recent noncontrast CT.  As noted,  blood pressure is well controlled.  Arrange chest CTA in 1 year.  4. Frequent PVCs Managed with verapamil.  Recent monitor in 6/22 demonstrated <1% PVCs.  She had no significant arrhythmias.  5. PVD (peripheral vascular disease) with claudication ([place]) Continue follow-up with vascular surgery as planned.  Carotid ultrasound was discussed at last visit but deferred until now.  I will make sure she has a carotid US arranged to rule out significant ICA stenosis.  Continue aspirin, atorvastatin.  6. AAA (abdominal aortic aneurysm) without rupture (HCC) 3.3 cm by ultrasound in 2017.  7. Tobacco abuse She is trying to quit.  8. Essential hypertension The patient's blood pressure is controlled on her current regimen.  Continue current therapy.   9. Mixed hyperlipidemia LDL optimal on most recent lab work.  Continue current Rx.    10. Hyponatremia Sodium was significantly low on recent lab work.  Repeat sodium was improved on lower dose furosemide.  Obtain follow-up BMET today.  11. Other constipation He has significant issues with constipation.  She was able to get lactulose from a relative that has been taking that without significant improvement.  She has not had any vomiting.  We discussed referral to gastroenterology.  She plans to see her PCP soon and will request referral.  Dispo:  Return in about 6 months (around 10/27/2021) for Routine Follow Up, w/ Dr. Johney Frame, or Richardson Dopp, PA-C.   Medication Adjustments/Labs and Tests Ordered: Current medicines are reviewed at length with the patient today.  Concerns regarding medicines are outlined above.  Tests Ordered: Orders Placed This Encounter  Procedures   CT ANGIO CHEST AORTA W/CM & OR WO/CM   Basic metabolic panel   VAS US CAROTID    Medication Changes: Meds ordered this encounter  Medications   ezetimibe (ZETIA) 10 MG tablet    Sig: Take 1 tablet (10 mg total) by mouth daily.    Dispense:  90 tablet    Refill:  3      Signed, Richardson Dopp, PA-C  04/26/2021 8:55 AM    [facility] [address], [place], [place]  [address] Phone: [phone]; Fax: [phone]

## 2021-04-26 ENCOUNTER — Other Ambulatory Visit: Payer: Self-pay

## 2021-04-26 ENCOUNTER — Ambulatory Visit: Payer: Medicare Other | Admitting: Physician Assistant

## 2021-04-26 ENCOUNTER — Encounter: Payer: Self-pay | Admitting: Physician Assistant

## 2021-04-26 VITALS — BP 120/70 | HR 88 | Ht 68.0 in | Wt 110.6 lb

## 2021-04-26 DIAGNOSIS — I5032 Chronic diastolic (congestive) heart failure: Secondary | ICD-10-CM | POA: Diagnosis not present

## 2021-04-26 DIAGNOSIS — I712 Thoracic aortic aneurysm, without rupture, unspecified: Secondary | ICD-10-CM

## 2021-04-26 DIAGNOSIS — I714 Abdominal aortic aneurysm, without rupture, unspecified: Secondary | ICD-10-CM

## 2021-04-26 DIAGNOSIS — I2510 Atherosclerotic heart disease of native coronary artery without angina pectoris: Secondary | ICD-10-CM | POA: Diagnosis not present

## 2021-04-26 DIAGNOSIS — I493 Ventricular premature depolarization: Secondary | ICD-10-CM

## 2021-04-26 DIAGNOSIS — I10 Essential (primary) hypertension: Secondary | ICD-10-CM | POA: Diagnosis not present

## 2021-04-26 DIAGNOSIS — Z720 Tobacco use: Secondary | ICD-10-CM

## 2021-04-26 DIAGNOSIS — E782 Mixed hyperlipidemia: Secondary | ICD-10-CM

## 2021-04-26 DIAGNOSIS — I739 Peripheral vascular disease, unspecified: Secondary | ICD-10-CM

## 2021-04-26 DIAGNOSIS — K5909 Other constipation: Secondary | ICD-10-CM | POA: Diagnosis not present

## 2021-04-26 DIAGNOSIS — E871 Hypo-osmolality and hyponatremia: Secondary | ICD-10-CM

## 2021-04-26 LAB — BASIC METABOLIC PANEL
BUN/Creatinine Ratio: 18 (ref 12–28)
BUN: 14 mg/dL (ref 8–27)
CO2: 23 mmol/L (ref 20–29)
Calcium: 9.4 mg/dL (ref 8.7–10.3)
Chloride: 98 mmol/L (ref 96–106)
Creatinine, Ser: 0.78 mg/dL (ref 0.57–1.00)
Glucose: 94 mg/dL (ref 65–99)
Potassium: 4.7 mmol/L (ref 3.5–5.2)
Sodium: 136 mmol/L (ref 134–144)
eGFR: 81 mL/min/{1.73_m2} (ref >59)

## 2021-04-26 MED ORDER — EZETIMIBE 10 MG PO TABS: 10.0000 mg | ORAL_TABLET | Freq: Every day | ORAL | 3 refills | Status: DC

## 2021-04-26 NOTE — Patient Instructions (Addendum)
Medication Instructions:  Your physician recommends that you continue on your current medications as directed. Please refer to the Current Medication list given to you today.  Labwork: You will have labs drawn today: BMP  Testing/Procedures:  Carotid Ultrasound at our [place] location  Chest CT in one year to follow up on your thoracic aneurysm   Follow-Up:  Monday October 30, 2021 at 8:15 am with Dr. Johney Frame  Any Other Special Instructions Will Be Listed Below (If Applicable).     If you need a refill on your cardiac medications before your next appointment, please call your pharmacy.

## 2021-05-09 DIAGNOSIS — Z79891 Long term (current) use of opiate analgesic: Secondary | ICD-10-CM | POA: Diagnosis not present

## 2021-05-09 DIAGNOSIS — M5459 Other low back pain: Secondary | ICD-10-CM | POA: Diagnosis not present

## 2021-05-09 DIAGNOSIS — G894 Chronic pain syndrome: Secondary | ICD-10-CM | POA: Diagnosis not present

## 2021-05-09 DIAGNOSIS — Z79899 Other long term (current) drug therapy: Secondary | ICD-10-CM | POA: Diagnosis not present

## 2021-05-09 DIAGNOSIS — G629 Polyneuropathy, unspecified: Secondary | ICD-10-CM | POA: Diagnosis not present

## 2021-05-09 DIAGNOSIS — M25559 Pain in unspecified hip: Secondary | ICD-10-CM | POA: Diagnosis not present

## 2021-05-09 DIAGNOSIS — M79606 Pain in leg, unspecified: Secondary | ICD-10-CM | POA: Diagnosis not present

## 2021-05-11 ENCOUNTER — Other Ambulatory Visit: Payer: Self-pay

## 2021-05-11 ENCOUNTER — Other Ambulatory Visit: Payer: Self-pay | Admitting: Physician Assistant

## 2021-05-11 ENCOUNTER — Ambulatory Visit
Admission: RE | Admit: 2021-05-11 | Discharge: 2021-05-11 | Disposition: A | Discharge status: Home / Self Care | Payer: Medicare Other | Source: Ambulatory Visit | Attending: Physician Assistant | Admitting: Physician Assistant

## 2021-05-11 ENCOUNTER — Ambulatory Visit (HOSPITAL_COMMUNITY)
Admission: RE | Admit: 2021-05-11 | Discharge: 2021-05-11 | Disposition: A | Discharge status: Home / Self Care | Payer: Medicare Other | Source: Ambulatory Visit | Attending: Cardiology | Admitting: Cardiology

## 2021-05-11 DIAGNOSIS — I739 Peripheral vascular disease, unspecified: Secondary | ICD-10-CM

## 2021-05-11 DIAGNOSIS — R52 Pain, unspecified: Secondary | ICD-10-CM

## 2021-05-11 DIAGNOSIS — M545 Low back pain, unspecified: Secondary | ICD-10-CM | POA: Diagnosis not present

## 2021-05-11 DIAGNOSIS — Z720 Tobacco use: Secondary | ICD-10-CM | POA: Insufficient documentation

## 2021-05-11 DIAGNOSIS — I6523 Occlusion and stenosis of bilateral carotid arteries: Secondary | ICD-10-CM | POA: Diagnosis not present

## 2021-05-11 DIAGNOSIS — M25552 Pain in left hip: Secondary | ICD-10-CM | POA: Diagnosis not present

## 2021-05-12 ENCOUNTER — Encounter: Payer: Self-pay | Admitting: Physician Assistant

## 2021-05-16 ENCOUNTER — Other Ambulatory Visit: Payer: Self-pay | Admitting: *Deleted

## 2021-05-16 DIAGNOSIS — E041 Nontoxic single thyroid nodule: Secondary | ICD-10-CM

## 2021-05-17 ENCOUNTER — Other Ambulatory Visit: Payer: Medicare Other

## 2021-05-18 ENCOUNTER — Ambulatory Visit
Admission: RE | Admit: 2021-05-18 | Discharge: 2021-05-18 | Disposition: A | Discharge status: Home / Self Care | Payer: Medicare Other | Source: Ambulatory Visit | Attending: Physician Assistant | Admitting: Physician Assistant

## 2021-05-18 DIAGNOSIS — E041 Nontoxic single thyroid nodule: Secondary | ICD-10-CM | POA: Diagnosis not present

## 2021-05-23 DIAGNOSIS — I712 Thoracic aortic aneurysm, without rupture: Secondary | ICD-10-CM | POA: Diagnosis not present

## 2021-05-23 DIAGNOSIS — I5030 Unspecified diastolic (congestive) heart failure: Secondary | ICD-10-CM | POA: Diagnosis not present

## 2021-05-23 DIAGNOSIS — Z681 Body mass index (BMI) 19 or less, adult: Secondary | ICD-10-CM | POA: Diagnosis not present

## 2021-05-23 DIAGNOSIS — I700 Atherosclerosis of aorta: Secondary | ICD-10-CM | POA: Diagnosis not present

## 2021-05-23 DIAGNOSIS — R490 Dysphonia: Secondary | ICD-10-CM | POA: Diagnosis not present

## 2021-05-23 DIAGNOSIS — J432 Centrilobular emphysema: Secondary | ICD-10-CM | POA: Diagnosis not present

## 2021-05-23 DIAGNOSIS — E041 Nontoxic single thyroid nodule: Secondary | ICD-10-CM | POA: Diagnosis not present

## 2021-05-23 DIAGNOSIS — E042 Nontoxic multinodular goiter: Secondary | ICD-10-CM | POA: Diagnosis not present

## 2021-05-25 ENCOUNTER — Other Ambulatory Visit: Payer: Self-pay | Admitting: Family Medicine

## 2021-05-25 DIAGNOSIS — E041 Nontoxic single thyroid nodule: Secondary | ICD-10-CM

## 2021-06-06 ENCOUNTER — Other Ambulatory Visit (HOSPITAL_COMMUNITY)
Admission: RE | Admit: 2021-06-06 | Discharge: 2021-06-06 | Disposition: A | Discharge status: Home / Self Care | Payer: Medicare Other | Source: Ambulatory Visit | Attending: Family Medicine | Admitting: Family Medicine

## 2021-06-06 ENCOUNTER — Ambulatory Visit
Admission: RE | Admit: 2021-06-06 | Discharge: 2021-06-06 | Disposition: A | Discharge status: Home / Self Care | Payer: Medicare Other | Source: Ambulatory Visit | Attending: Family Medicine | Admitting: Family Medicine

## 2021-06-06 DIAGNOSIS — E041 Nontoxic single thyroid nodule: Secondary | ICD-10-CM | POA: Diagnosis not present

## 2021-06-06 DIAGNOSIS — D34 Benign neoplasm of thyroid gland: Secondary | ICD-10-CM | POA: Diagnosis not present

## 2021-06-07 LAB — CYTOLOGY - NON PAP

## 2021-06-13 DIAGNOSIS — G629 Polyneuropathy, unspecified: Secondary | ICD-10-CM | POA: Diagnosis not present

## 2021-06-13 DIAGNOSIS — G894 Chronic pain syndrome: Secondary | ICD-10-CM | POA: Diagnosis not present

## 2021-06-13 DIAGNOSIS — M7060 Trochanteric bursitis, unspecified hip: Secondary | ICD-10-CM | POA: Diagnosis not present

## 2021-06-13 DIAGNOSIS — M47817 Spondylosis without myelopathy or radiculopathy, lumbosacral region: Secondary | ICD-10-CM | POA: Diagnosis not present

## 2021-07-08 ENCOUNTER — Other Ambulatory Visit: Payer: Self-pay | Admitting: Physician Assistant

## 2021-07-08 DIAGNOSIS — I739 Peripheral vascular disease, unspecified: Secondary | ICD-10-CM

## 2021-07-08 DIAGNOSIS — I493 Ventricular premature depolarization: Secondary | ICD-10-CM

## 2021-07-08 DIAGNOSIS — M791 Myalgia, unspecified site: Secondary | ICD-10-CM

## 2021-07-08 DIAGNOSIS — E7849 Other hyperlipidemia: Secondary | ICD-10-CM

## 2021-07-08 DIAGNOSIS — I2510 Atherosclerotic heart disease of native coronary artery without angina pectoris: Secondary | ICD-10-CM

## 2021-07-08 DIAGNOSIS — I10 Essential (primary) hypertension: Secondary | ICD-10-CM

## 2021-07-13 DIAGNOSIS — M7060 Trochanteric bursitis, unspecified hip: Secondary | ICD-10-CM | POA: Diagnosis not present

## 2021-07-13 DIAGNOSIS — M47817 Spondylosis without myelopathy or radiculopathy, lumbosacral region: Secondary | ICD-10-CM | POA: Diagnosis not present

## 2021-07-13 DIAGNOSIS — G629 Polyneuropathy, unspecified: Secondary | ICD-10-CM | POA: Diagnosis not present

## 2021-07-13 DIAGNOSIS — G894 Chronic pain syndrome: Secondary | ICD-10-CM | POA: Diagnosis not present

## 2021-07-17 DIAGNOSIS — I10 Essential (primary) hypertension: Secondary | ICD-10-CM | POA: Diagnosis not present

## 2021-07-17 DIAGNOSIS — Z23 Encounter for immunization: Secondary | ICD-10-CM | POA: Diagnosis not present

## 2021-07-17 DIAGNOSIS — E559 Vitamin D deficiency, unspecified: Secondary | ICD-10-CM | POA: Diagnosis not present

## 2021-07-17 DIAGNOSIS — M109 Gout, unspecified: Secondary | ICD-10-CM | POA: Diagnosis not present

## 2021-07-17 DIAGNOSIS — Z681 Body mass index (BMI) 19 or less, adult: Secondary | ICD-10-CM | POA: Diagnosis not present

## 2021-07-17 DIAGNOSIS — Z79899 Other long term (current) drug therapy: Secondary | ICD-10-CM | POA: Diagnosis not present

## 2021-07-17 DIAGNOSIS — R490 Dysphonia: Secondary | ICD-10-CM | POA: Diagnosis not present

## 2021-07-17 DIAGNOSIS — E041 Nontoxic single thyroid nodule: Secondary | ICD-10-CM | POA: Diagnosis not present

## 2021-07-17 DIAGNOSIS — E7800 Pure hypercholesterolemia, unspecified: Secondary | ICD-10-CM | POA: Diagnosis not present

## 2021-07-18 ENCOUNTER — Ambulatory Visit: Payer: Medicare Other | Admitting: Vascular Surgery

## 2021-07-18 ENCOUNTER — Encounter: Payer: Self-pay | Admitting: Vascular Surgery

## 2021-07-18 ENCOUNTER — Ambulatory Visit (HOSPITAL_COMMUNITY)
Admission: RE | Admit: 2021-07-18 | Discharge: 2021-07-18 | Disposition: A | Discharge status: Home / Self Care | Payer: Medicare Other | Source: Ambulatory Visit | Attending: Vascular Surgery | Admitting: Vascular Surgery

## 2021-07-18 ENCOUNTER — Other Ambulatory Visit: Payer: Self-pay

## 2021-07-18 ENCOUNTER — Ambulatory Visit (INDEPENDENT_AMBULATORY_CARE_PROVIDER_SITE_OTHER)
Admission: RE | Admit: 2021-07-18 | Discharge: 2021-07-18 | Disposition: A | Discharge status: Home / Self Care | Payer: Medicare Other | Source: Ambulatory Visit | Attending: Vascular Surgery | Admitting: Vascular Surgery

## 2021-07-18 VITALS — BP 150/84 | HR 82 | Temp 98.3°F | Wt 115.6 lb

## 2021-07-18 DIAGNOSIS — I739 Peripheral vascular disease, unspecified: Secondary | ICD-10-CM

## 2021-07-18 NOTE — Progress Notes (Signed)
Patient name: Marie Johnson MRN: [id number] DOB: 09-18-1950 Sex: female  REASON FOR VISIT: 6 month follow-up for surveillance of bilateral iliac stents and bilateral lower extremity bypass  HPI: Marie Johnson is a 70 y.o. female with significant comorbidities as listed below that presents for 6 month follow-up for surveillance of her peripheral vascular disease.   She has somewhat complex history.  She initially had a left common femoral to above-knee popliteal bypass by Dr. Donnetta Hutching about 20 years ago.  I initially saw her with bilateral lower extremity critical limb ischemia.  On 03/16/2020 she underwent left common and external iliac artery angioplasty with stent placement with a patent bypass that Dr. Donnetta Hutching had previously performed.  At a later date on 03/18/2020, I performed right common femoral endarterectomy with retrograde right common iliac artery stenting and then a right common femoral to below knee pop bypass with PTFE.  On follow-up today she has no new complaints.  She has new hoarseness and is being referred to ENT.  She is still smoking about 4 cigarettes a day  Past Medical History:  Diagnosis Date   AAA (abdominal aortic aneurysm)    3.4 cm   Anemia    Anxiety    Aortic atherosclerosis (HCC)    Arthritis    Asthma    CAD (coronary artery disease)    a. Nonobstructive CAD by cath 07/17/13.   Carotid artery disease ([place])    Carotid US 8/22: Bilateral ICA 1-39; right subclavian stenosis; right thyroid nodule   CHD (congenital heart disease)    pt unaware???   Chest pain    a. Adm 10-07/2013 - CTA neg for PE/dissection, cath with nonobstructive disease, CP ?due to uncontrolled HTN vs coronary vasospasm.   CHF (congestive heart failure) (HCC)    COPD (chronic obstructive pulmonary disease) (HCC)    Coronary artery disease    Echocardiogram    Echocardiogram 06/2019: EF 60-65, impaired relaxation (Gr 1 DD), mild MAC, mild MR, mild to mod aortic valve sclerosis (no AS),  trivial TR, mild LAE, normal RVSF, RVSP 30.7 (mildly elevated)   GERD (gastroesophageal reflux disease)    Hyperlipidemia    Hypertension    Neuropathy    OA (osteoarthritis)    Osteoporosis    PAD (peripheral artery disease) (HCC)    a. s/p L fem-pop bypass b. recent evaluation at [facility], unamenable to intervention   PAD (peripheral artery disease) ([place])    Palpitations 02/22/2016   Pneumonia    Pre-diabetes    Premature ventricular contraction    PUD (peptic ulcer disease)    PVD (peripheral vascular disease) ([place]) 07/17/2013   Tobacco abuse    Wears dentures    Wears glasses     Past Surgical History:  Procedure Laterality Date   ABDOMINAL AORTOGRAM W/LOWER EXTREMITY N/A 03/09/2020   Procedure: ABDOMINAL AORTOGRAM W/LOWER EXTREMITY;  Surgeon: [name], MD;  Location: [place] [facility];  Service: Cardiovascular;  Laterality: N/A;   ABDOMINAL HYSTERECTOMY     BREAST SURGERY     left cyst excision x 2   CARDIAC CATHETERIZATION     several years ago, nonobstructive, "50-60% blockages"   ENDARTERECTOMY FEMORAL Right 03/18/2020   Procedure: ENDARTERECTOMY FEMORAL WITH PROFUNDAPLASTY;  Surgeon: [name], MD;  Location: [facility] OR;  Service: Vascular;  Laterality: Right;   FEMORAL-POPLITEAL BYPASS GRAFT Right 03/18/2020   Procedure: BYPASS GRAFT FEMORAL-POPLITEAL ARTERY using GORE PROPATEN VASCULAR GRAFT;  Surgeon: [name], MD;  Location: [facility]  OR;  Service: Vascular;  Laterality: Right;   HIP SURGERY     left - bars and screws placed   [place] Right 03/18/2020   Procedure: INSERTION OF GORE VIABAHN  ILIAC STENT;  Surgeon: [name], MD;  Location: [place];  Service: Vascular;  Laterality: Right;   LEFT HEART CATHETERIZATION WITH CORONARY ANGIOGRAM N/A 07/17/2013   Procedure: LEFT HEART CATHETERIZATION WITH CORONARY ANGIOGRAM;  Surgeon: Larey Dresser, MD;  Location: [facility] CATH LAB;  Service: Cardiovascular;  Laterality:  N/A;   LOWER EXTREMITY ANGIOGRAM Right 03/18/2020   Procedure: RIGHT ILIAC ARTERIOGRAM WITH AN ILIAC STENT;  Surgeon: [name], MD;  Location: [place];  Service: Vascular;  Laterality: Right;   MULTIPLE TOOTH EXTRACTIONS     PATCH ANGIOPLASTY Right 03/18/2020   Procedure: PATCH ANGIOPLASTY using a [place] OF THE PROFUNDA;  Surgeon: [name], MD;  Location: [place];  Service: Vascular;  Laterality: Right;   PERIPHERAL VASCULAR CATHETERIZATION N/A 07/16/2016   Procedure: Abdominal Aortogram w/Lower Extremity;  Surgeon: Rosetta Posner, MD;  Location: [place] CV LAB;  Service: Cardiovascular;  Laterality: N/A;   PERIPHERAL VASCULAR INTERVENTION Left 03/09/2020   Procedure: PERIPHERAL VASCULAR INTERVENTION;  Surgeon: [name], MD;  Location: [place] CV [facility];  Service: Cardiovascular;  Laterality: Left;  common and external illiac    ROTATOR CUFF REPAIR     right side, had torn ligaments as well   TUBAL LIGATION     VEIN BYPASS SURGERY     left leg   WRIST ARTHROCENTESIS     left - broke in 3 places so has bars and screws placed    Family History  Problem Relation Age of Onset   Cervical cancer Mother    Heart disease Mother        MI in 48s   Liver cancer Father    Heart disease Father    Lung cancer Maternal Aunt    Prostate cancer Maternal Uncle        met. anal   Breast cancer Maternal Grandmother    Lung cancer Maternal Aunt    Lung cancer Maternal Aunt    Stomach cancer Maternal Aunt    Colon cancer Maternal Aunt    Lung cancer Maternal Uncle     SOCIAL HISTORY: Social History   Tobacco Use   Smoking status: Every Day    Packs/day: 0.25    Years: 50.00    Pack years: 12.50    Types: Cigarettes   Smokeless tobacco: Never   Tobacco comments:    1-2 cigarettes Daily  Substance Use Topics   Alcohol use: Never    Allergies  Allergen Reactions   Bee Venom Anaphylaxis   Bee Venom Anaphylaxis and Other (See Comments)     Welts, also   Insect Extract Allergy Skin Test Anaphylaxis and Other (See Comments)    Insect bites- Welts, also   Nsaids Nausea Only and Other (See Comments)    Patient is not suppose to have this class of medication (liver)   Latex Rash   Latex Rash    Current Outpatient Medications  Medication Sig Dispense Refill   albuterol (PROVENTIL HFA;VENTOLIN HFA) 108 (90 BASE) MCG/ACT inhaler Inhale 2 puffs into the lungs every 6 (six) hours as needed for wheezing.      albuterol (PROVENTIL) (2.5 MG/3ML) 0.083% nebulizer solution Take 2.5 mg by nebulization every 6 (six) hours as  needed for wheezing or shortness of breath.     alendronate (FOSAMAX) 70 MG tablet Take 70 mg by mouth every Friday.      allopurinol (ZYLOPRIM) 300 MG tablet Take 300 mg by mouth daily.      aspirin EC 81 MG tablet Take 81 mg by mouth daily.      atorvastatin (LIPITOR) 80 MG tablet Take 80 mg by mouth at bedtime.     cholecalciferol (VITAMIN D3) 25 MCG (1000 UNIT) tablet Take 5,000 Units by mouth daily.     diphenhydrAMINE (BENADRYL) 25 MG tablet Take 50 mg by mouth at bedtime.     EPIPEN 2-PAK 0.3 MG/0.3ML SOAJ injection Inject 0.3 mg into the muscle daily as needed. Use as directed as needed for allergies.     ezetimibe (ZETIA) 10 MG tablet Take 1 tablet (10 mg total) by mouth daily. 90 tablet 3   furosemide (LASIX) 40 MG tablet Take 1 tablet (40 mg total) by mouth daily. For 5 days only then repeat labs. 30 tablet 0   KLOR-CON M20 20 MEQ tablet TAKE 1 TABLET BY MOUTH EVERY DAY 90 tablet 3   lactulose (CHRONULAC) 10 GM/15ML solution Take 20 g by mouth as needed for moderate constipation.      Menthol-Methyl Salicylate (MUSCLE RUB) 10-15 % CREA Apply 1 application topically daily as needed (for arthritis pain).     nitroGLYCERIN (NITROSTAT) 0.4 MG SL tablet Place 1 tablet (0.4 mg total) under the tongue every 5 (five) minutes x 3 doses as needed for chest pain. 25 tablet 4   Oxycodone HCl 10 MG TABS Take 1 tablet (10 mg  total) by mouth 4 (four) times daily as needed (for pain). 20 tablet 0   pantoprazole (PROTONIX) 40 MG tablet Take 40 mg by mouth 2 (two) times daily.     senna-docusate (SENOKOT-S) 8.6-50 MG tablet Take 2 tablets by mouth at bedtime.     spironolactone (ALDACTONE) 25 MG tablet Take 1 tablet (25 mg total) by mouth daily. 90 tablet 3   verapamil (CALAN-SR) 240 MG CR tablet TAKE 1 TABLET BY MOUTH AT BEDTIME. 90 tablet 3   dapagliflozin propanediol (FARXIGA) 10 MG TABS tablet Take 1 tablet (10 mg total) by mouth daily before breakfast. (Patient not taking: Reported on 07/18/2021) 30 tablet 3   No current facility-administered medications for this visit.   Facility-Administered Medications Ordered in Other Visits  Medication Dose Route Frequency Provider Last Rate Last Admin   DOBUTamine (DOBUTREX) 1,000 mcg/mL in dextrose 5% 250 mL infusion  30 mcg/kg/min Intravenous Titrated Dorothy Spark, MD        REVIEW OF SYSTEMS:  _0  denotes positive finding, _1  denotes negative finding Cardiac  Comments:  Chest pain or chest pressure:    Shortness of breath upon exertion:    Short of breath when lying flat:    Irregular heart rhythm:        Vascular    Pain in calf, thigh, or hip brought on by ambulation:    Pain in feet at night that wakes you up from your sleep:     Blood clot in your veins:    Leg swelling:         Pulmonary    Oxygen at home:    Productive cough:     Wheezing:         Neurologic    Sudden weakness in arms or legs:     Sudden numbness in arms or legs:  Sudden onset of difficulty speaking or slurred speech:    Temporary loss of vision in one eye:     Problems with dizziness:         Gastrointestinal    Blood in stool:     Vomited blood:         Genitourinary    Burning when urinating:     Blood in urine:        Psychiatric    Major depression:         Hematologic    Bleeding problems:    Problems with blood clotting too easily:        Skin     Rashes or ulcers:        Constitutional    Fever or chills:      PHYSICAL EXAM: Vitals:   07/18/21 0920  BP: (!) 150/84  Pulse: 82  Temp: 98.3 F (36.8 C)  TempSrc: Skin  SpO2: 95%  Weight: 115 lb 9.6 oz (52.4 kg)    GENERAL: The patient is a well-nourished female, in no acute distress. The vital signs are documented above. CARDIAC: There is a regular rate and rhythm.  VASCULAR:  Palpable femoral pulses bilaterally Right DP 1+ palpable Left pedal pulses nonpalpable but brisk peroneal signal at the ankle PULMONARY: No respiratory distress. ABDOMEN: Soft and non-tender. MUSCULOSKELETAL: There are no major deformities or cyanosis. NEUROLOGIC: No focal weakness or paresthesias are detected. SKIN: There are no ulcers or rashes noted.   DATA:   Iliac duplex today shows no stenosis in her iliac stents and these appear widely patent.  Right lower extremity arterial duplex shows bypass widely patent.  ABIs today are 1.01 on the right triphasic and 0.81 on the left biphasic  Assessment/Plan:  70 year old female with complex medical history that presents for interval follow-up of her previous vascular interventions for CLI.  She has bilateral common iliac stents as well as a left external iliac stent and also has a left common femoral to above-knee popliteal bypass with vein and a right common femoral to below-knee popliteal bypass with PTFE.  Based on duplex today her iliac stents and right leg bypass are all widely patent.    She has a palpable right DP pulse.  Her left peroneal signal is excellent and sounds biphasic.  Although the left leg bypass was not evaluated, it appears patent by exam.  No significant left leg symptoms.  I think she is doing well from a vascular standpoint.  I again discussed the importance of smoking cessation.  She will need to stay on aspirin.  I will see her in 1 year with bilateral aortoiliac duplex as well as bilateral lower extremity arterial  duplexes and ABIs for surveillance.   [name], MD [facility] Office: [phone]

## 2021-08-01 DIAGNOSIS — R200 Anesthesia of skin: Secondary | ICD-10-CM | POA: Diagnosis not present

## 2021-08-01 DIAGNOSIS — M79609 Pain in unspecified limb: Secondary | ICD-10-CM | POA: Diagnosis not present

## 2021-08-01 DIAGNOSIS — M545 Low back pain, unspecified: Secondary | ICD-10-CM | POA: Diagnosis not present

## 2021-08-14 DIAGNOSIS — M7060 Trochanteric bursitis, unspecified hip: Secondary | ICD-10-CM | POA: Diagnosis not present

## 2021-08-14 DIAGNOSIS — G629 Polyneuropathy, unspecified: Secondary | ICD-10-CM | POA: Diagnosis not present

## 2021-08-14 DIAGNOSIS — M47817 Spondylosis without myelopathy or radiculopathy, lumbosacral region: Secondary | ICD-10-CM | POA: Diagnosis not present

## 2021-08-14 DIAGNOSIS — G894 Chronic pain syndrome: Secondary | ICD-10-CM | POA: Diagnosis not present

## 2021-08-18 ENCOUNTER — Other Ambulatory Visit: Payer: Self-pay | Admitting: *Deleted

## 2021-08-18 DIAGNOSIS — E7849 Other hyperlipidemia: Secondary | ICD-10-CM

## 2021-08-18 DIAGNOSIS — I2510 Atherosclerotic heart disease of native coronary artery without angina pectoris: Secondary | ICD-10-CM

## 2021-08-18 DIAGNOSIS — I739 Peripheral vascular disease, unspecified: Secondary | ICD-10-CM

## 2021-08-18 DIAGNOSIS — I10 Essential (primary) hypertension: Secondary | ICD-10-CM

## 2021-08-18 DIAGNOSIS — M791 Myalgia, unspecified site: Secondary | ICD-10-CM

## 2021-08-18 MED ORDER — POTASSIUM CHLORIDE CRYS ER 20 MEQ PO TBCR
20.0000 meq | EXTENDED_RELEASE_TABLET | Freq: Every day | ORAL | 3 refills | Status: DC
Start: 2021-08-18 — End: 2022-07-27

## 2021-09-13 DIAGNOSIS — G629 Polyneuropathy, unspecified: Secondary | ICD-10-CM | POA: Diagnosis not present

## 2021-09-13 DIAGNOSIS — M47817 Spondylosis without myelopathy or radiculopathy, lumbosacral region: Secondary | ICD-10-CM | POA: Diagnosis not present

## 2021-09-13 DIAGNOSIS — M7060 Trochanteric bursitis, unspecified hip: Secondary | ICD-10-CM | POA: Diagnosis not present

## 2021-09-13 DIAGNOSIS — G894 Chronic pain syndrome: Secondary | ICD-10-CM | POA: Diagnosis not present

## 2021-10-03 DIAGNOSIS — L570 Actinic keratosis: Secondary | ICD-10-CM | POA: Diagnosis not present

## 2021-10-03 DIAGNOSIS — L820 Inflamed seborrheic keratosis: Secondary | ICD-10-CM | POA: Diagnosis not present

## 2021-10-09 ENCOUNTER — Other Ambulatory Visit: Payer: Self-pay | Admitting: *Deleted

## 2021-10-09 DIAGNOSIS — I5032 Chronic diastolic (congestive) heart failure: Secondary | ICD-10-CM

## 2021-10-09 DIAGNOSIS — E871 Hypo-osmolality and hyponatremia: Secondary | ICD-10-CM

## 2021-10-09 DIAGNOSIS — Z79899 Other long term (current) drug therapy: Secondary | ICD-10-CM

## 2021-10-09 DIAGNOSIS — Z0189 Encounter for other specified special examinations: Secondary | ICD-10-CM

## 2021-10-09 NOTE — Telephone Encounter (Signed)
error 

## 2021-10-10 DIAGNOSIS — Z1231 Encounter for screening mammogram for malignant neoplasm of breast: Secondary | ICD-10-CM | POA: Diagnosis not present

## 2021-10-10 DIAGNOSIS — Z681 Body mass index (BMI) 19 or less, adult: Secondary | ICD-10-CM | POA: Diagnosis not present

## 2021-10-18 NOTE — Progress Notes (Signed)
Cardiology Office Note:    Date:  03/17/7792   ID:  JOHNESHA Johnson, DOB [birth year], MRN [id number]  PCP:  Leonides Sake, MD   [facility] Providers Cardiologist:  Freada Bergeron, MD Cardiology APP:  Liliane Shi, PA-C  Electrophysiologist:  Thompson Grayer, MD  {  Referring MD: Philmore Pali, NP    History of Present Illness:    Marie Johnson is a 71 y.o. female with a hx of nonobstructive CAD, diastolic heart failure, PAD s/p L fem-pop bypass, L CIA and EIA stenting 02/2020, R CIA stent, RFA endartecetomy and right fem-pop bypass, AAA, COPD, HTN, HLD, and tobacco use who was previously followed by Dr. Meda Coffee who now returns to clinic for follow-up.  Was seen by me on 01/2021 where she was having worsening DOE and ECG showed frequent PVCs. We obtained myoview which was low risk. Zio showed NSR, <1% PVC burden. CT for lung cancer screening revealed thoracic aneurysm 9m.   Last seen in clinic by SRichardson Doppon 04/2021. Breathing was stable and LE edema was improved.  Today, the patient states she has been having significant LE pain due to chronic PAD, peripheral neuropathy, and arthritis. Has been changed from oxycodone to morphine which has resulted in significant constipation. Has been managed by [facility]. Occasional GERD but no exertional chest pain. Has chronic dyspnea on exertion which has worsened with the cold weather. No palpitations, syncope, or lightheadedness. Continued trace LE edema. LE dopplers 07/2021 normal right ABI, abnormal TBI. Left ABI with mild LE disease and abnormal TBI. Notably, has gained 9lbs over the past 2 weeks. Has noticed some orthopnea but chronically sleeps propped up. No PND. Mild abdominal distension.   Past Medical History:  Diagnosis Date   AAA (abdominal aortic aneurysm)    3.4 cm   Anemia    Anxiety    Aortic atherosclerosis (HCC)    Arthritis    Asthma    CAD (coronary artery disease)    a. Nonobstructive CAD by cath 07/17/13.    Carotid artery disease ([place]    Carotid [place]: Bilateral ICA 1-39; right subclavian stenosis; right thyroid nodule   CHD (congenital heart disease)    pt unaware???   Chest pain    a. Adm 10-07/2013 - CTA neg for PE/dissection, cath with nonobstructive disease, CP ?due to uncontrolled HTN vs coronary vasospasm.   CHF (congestive heart failure) (HCC)    COPD (chronic obstructive pulmonary disease) (HCC)    Coronary artery disease    Echocardiogram    Echocardiogram 06/2019: EF 60-65, impaired relaxation (Gr 1 DD), mild MAC, mild MR, mild to mod aortic valve sclerosis (no AS), trivial TR, mild LAE, normal RVSF, RVSP 30.7 (mildly elevated)   GERD (gastroesophageal reflux disease)    Hyperlipidemia    Hypertension    Neuropathy    OA (osteoarthritis)    Osteoporosis    PAD (peripheral artery disease) (HCC)    a. s/p L fem-pop bypass [facility]   PAD (peripheral artery disease) ([place]    Palpitations 02/22/2016   Pneumonia    Pre-diabetes    Premature ventricular contraction    PUD (peptic ulcer disease)    PVD [place] vascular disease) [place] 07/17/2013   Tobacco abuse    Wears dentures    Wears glasses     Past Surgical History:  Procedure Laterality Date   ABDOMINAL AORTOGRAM W/LOWER EXTREMITY N/A 03/09/2020   Procedure: ABDOMINAL AORTOGRAM  W/LOWER EXTREMITY;  Surgeon: [name], MD;  Location: [place] [facility];  Service: Cardiovascular;  Laterality: N/A;   ABDOMINAL HYSTERECTOMY     BREAST SURGERY     left cyst excision x 2   CARDIAC CATHETERIZATION     several years ago, nonobstructive, "50-60% blockages"   ENDARTERECTOMY FEMORAL Right 03/18/2020   Procedure: ENDARTERECTOMY FEMORAL WITH PROFUNDAPLASTY;  Surgeon: [name], MD;  Location: [place];  Service: Vascular;  Laterality: Right;   FEMORAL-POPLITEAL BYPASS GRAFT Right 03/18/2020   Procedure: BYPASS GRAFT FEMORAL-POPLITEAL ARTERY using GORE PROPATEN  VASCULAR GRAFT;  Surgeon: [name], MD;  Location: [place];  Service: Vascular;  Laterality: Right;   HIP SURGERY     left - bars and screws placed   [place] Right 03/18/2020   Procedure: INSERTION OF GORE VIABAHN  ILIAC STENT;  Surgeon: [name], MD;  Location: [place];  Service: Vascular;  Laterality: Right;   [place] N/A 07/17/2013   Procedure: LEFT HEART CATHETERIZATION WITH CORONARY ANGIOGRAM;  Surgeon: Larey Dresser, MD;  Location: [facility];  Service: Cardiovascular;  Laterality: N/A;   LOWER EXTREMITY ANGIOGRAM Right 03/18/2020   Procedure: RIGHT ILIAC ARTERIOGRAM WITH AN ILIAC STENT;  Surgeon: [name], MD;  Location: [place];  Service: Vascular;  Laterality: Right;   MULTIPLE TOOTH EXTRACTIONS     PATCH ANGIOPLASTY Right 03/18/2020   Procedure: PATCH ANGIOPLASTY using a [place] OF THE PROFUNDA;  Surgeon: [name], MD;  Location: [place];  Service: Vascular;  Laterality: Right;   PERIPHERAL VASCULAR CATHETERIZATION N/A 07/16/2016   Procedure: Abdominal Aortogram w/Lower Extremity;  Surgeon: Rosetta Posner, MD;  Location: [place] [facility];  Service: Cardiovascular;  Laterality: N/A;   PERIPHERAL VASCULAR INTERVENTION Left 03/09/2020   Procedure: PERIPHERAL VASCULAR INTERVENTION;  Surgeon: [name], MD;  Location: [place] [facility];  Service: Cardiovascular;  Laterality: Left;  common and external illiac    ROTATOR CUFF REPAIR     right side, had torn ligaments as well   TUBAL LIGATION     VEIN BYPASS SURGERY     left leg   WRIST ARTHROCENTESIS     left - broke in 3 places so has bars and screws placed    Current Medications: Current Meds  Medication Sig   albuterol (PROVENTIL HFA;VENTOLIN HFA) 108 (90 BASE) MCG/ACT inhaler Inhale 2 puffs into the lungs every 6 (six) hours as needed for wheezing.    albuterol (PROVENTIL) (2.5 MG/3ML) 0.083%  nebulizer solution Take 2.5 mg by nebulization every 6 (six) hours as needed for wheezing or shortness of breath.   alendronate (FOSAMAX) 70 MG tablet Take 70 mg by mouth every Friday.    allopurinol (ZYLOPRIM) 300 MG tablet Take 300 mg by mouth daily.    aspirin EC 81 MG tablet Take 81 mg by mouth daily.    atorvastatin (LIPITOR) 80 MG tablet Take 80 mg by mouth at bedtime.   cholecalciferol (VITAMIN D3) 25 MCG (1000 UNIT) tablet Take 5,000 Units by mouth daily.   dapagliflozin propanediol (FARXIGA) 10 MG TABS tablet Take 1 tablet (10 mg total) by mouth daily before breakfast.   diphenhydrAMINE (BENADRYL) 25 MG tablet Take 50 mg by mouth at bedtime.   EPIPEN 2-PAK 0.3 MG/0.3ML SOAJ injection Inject 0.3 mg into the muscle daily as needed. Use as directed as needed for allergies.  ezetimibe (ZETIA) 10 MG tablet Take 1 tablet (10 mg total) by mouth daily.   furosemide (LASIX) 40 MG tablet Take 1 tablet (40 mg total) by mouth daily. For 5 days only then repeat labs.   lactulose (CHRONULAC) 10 GM/15ML solution Take 20 g by mouth as needed for moderate constipation.    Menthol-Methyl Salicylate (MUSCLE RUB) 10-15 % CREA Apply 1 application topically daily as needed (for arthritis pain).   morphine (MSIR) 15 MG tablet Take 15 mg by mouth 6 (six) times daily.   nitroGLYCERIN (NITROSTAT) 0.4 MG SL tablet Place 1 tablet (0.4 mg total) under the tongue every 5 (five) minutes x 3 doses as needed for chest pain.   pantoprazole (PROTONIX) 40 MG tablet Take 40 mg by mouth 2 (two) times daily.   potassium chloride SA (KLOR-CON M20) 20 MEQ tablet Take 1 tablet (20 mEq total) by mouth daily.   senna-docusate (SENOKOT-S) 8.6-50 MG tablet Take 2 tablets by mouth at bedtime.   spironolactone (ALDACTONE) 25 MG tablet Take 1 tablet (25 mg total) by mouth daily.   verapamil (CALAN-SR) 240 MG CR tablet TAKE 1 TABLET BY MOUTH AT BEDTIME.     Allergies:   Bee venom, Bee venom, Insect extract allergy skin test, Nsaids,  Latex, and Latex   Social History   Socioeconomic History   Marital status: Married    Spouse name: Not on file   Number of children: Not on file   Years of education: Not on file   Highest education level: Not on file  Occupational History   Not on file  Tobacco Use   Smoking status: Every Day    Packs/day: 0.25    Years: 50.00    Pack years: 12.50    Types: Cigarettes   Smokeless tobacco: Never   Tobacco comments:    1-2 cigarettes Daily  Vaping Use   Vaping Use: Never used  Substance and Sexual Activity   Alcohol use: Never   Drug use: Never   Sexual activity: Not on file  Other Topics Concern   Not on file  Social History Narrative   ** Merged History Encounter **       Pt lives in [place] with husband.  Retired Production designer, theatre/television/film of Radio broadcast assistant Strain: Not on [employer] Insecurity: Not on file  Transportation Needs: Not on file  Physical Activity: Not on file  Stress: Not on file  Social Connections: Not on file     Family History: The patient's family history includes Breast cancer in her maternal grandmother; Cervical cancer in her mother; Colon cancer in her maternal aunt; Heart disease in her father and mother; Liver cancer in her father; Lung cancer in her maternal aunt, maternal aunt, maternal aunt, and maternal uncle; Prostate cancer in her maternal uncle; Stomach cancer in her maternal aunt.  ROS:   Please see the history of present illness.    Review of Systems  Constitutional:  Negative for chills and fever.  Respiratory:  Positive for shortness of breath.   Cardiovascular:  Positive for claudication and leg swelling. Negative for chest pain, palpitations, orthopnea and PND.  Gastrointestinal:  Positive for heartburn. Negative for nausea and vomiting.  Genitourinary:  Negative for dysuria.  Musculoskeletal:  Positive for joint pain.  Neurological:  Negative for dizziness and loss of consciousness.   Endo/Heme/Allergies:  Negative for polydipsia.  Psychiatric/Behavioral:  Negative for depression.    EKGs/Labs/Other Studies Reviewed:  The following studies were reviewed today: LONG TERM MONITOR (3-7 DAYS) INTERPRETATION 02/28/2021 Narrative  Patch wear time was 3 days and 4 hours.  Predominant rhythm was NSR with average HR 92 (ranging from 59-150bpm)  There were 3 runs of SVT with the longest lasting 7 beats at average rate of 126bpm; fastest was 7 beats at rate 150bpm  Patient triggered events mainly correlated with sinus tachycardia, PACs and PVCs  Rare SVEs (<1%) and rare PVCs (<1%)   Chest CT 02/17/21 4.4 cm distal descending thoracic aortic aneurysm.  Aortic Atherosclerosis    GATED SPECT MYO PERF W/LEXISCAN STRESS 1D 02/17/2021 1.  Normal MPI study without evidence of ischemia or infarction. 2.  Normal left ventricular function, EF> 65%. 3.  This is a low risk study. Echocardiogram 03/10/20 EF 55-60, inf HK, mild LVH, normal RVSF, trivial MR, AV sclerosis (no AS)   Echocardiogram 06/23/2019 EF 60-65, impaired relaxation, mild MAC, mild MR, trivial AI, mild to moderate aortic sclerosis, no aortic stenosis, mild LAE, RVSP 30.7   Myoview 04/22/2019 EF 70, normal perfusion, low risk   Dobutamine stress echocardiogram 03/22/2016 Normal dobutamine echo . Ambiant PVCls at rest and with infusion   Echocardiogram 03/22/2016 Mild LVH, EF 60-65, normal wall motion, grade 1 diastolic dysfunction, aortic sclerosis, trivial MR   Holter monitor 02/23/2016 Very frequent PVCs in pattern of bigeminy and trigeminy, total 41,000 PVCs in 48 hours.   Echocardiogram 07/18/2013 Mild to moderate LVH, EF 60, normal wall motion, mild LAE, normal RV SF   Cardiac catheterization 07/17/2013 LAD proximal 30-40 LCx ostial 40; OM1 30 ostial RCA proximal 30, mid 30   Myoview 07/16/2013 Normal stress nuclear study.  LV Ejection Fraction: 73%    EKG:  No new tracing  Recent Labs: 02/08/2021:  NT-Pro BNP 145 04/26/2021: BUN 14; Creatinine, Ser 0.78; Potassium 4.7; Sodium 136  Recent Lipid Panel    Component Value Date/Time   CHOL 125 02/02/2021 0947   TRIG 66 02/02/2021 0947   HDL 49 02/02/2021 0947   CHOLHDL 2.6 02/02/2021 0947   CHOLHDL 3.2 03/19/2020 1234   VLDL 22 03/19/2020 1234   LDLCALC 62 02/02/2021 0947     Physical Exam:    VS:  BP (!) 150/80    Pulse 72    Ht _0  (1.727 m)    Wt 123 lb 6.4 oz (56 kg)    SpO2 97%    BMI 18.76 kg/m     Wt Readings from Last 3 Encounters:  10/30/21 123 lb 6.4 oz (56 kg)  07/18/21 115 lb 9.6 oz (52.4 kg)  04/26/21 110 lb 9.6 oz (50.2 kg)     GEN:  Comfortable, NAD HEENT: Normal NECK: No JVD; No carotid bruits CARDIAC: RRR, 1/6 systolic murmur. No rubs, gallops RESPIRATORY:  Diminished but clear ABDOMEN: Soft, non-tender, non-distended MUSCULOSKELETAL: Trace bilateral edema. Chronic venous stasis changes  SKIN: Warm and dry NEUROLOGIC:  Alert and oriented x 3 PSYCHIATRIC:  Normal affect   ASSESSMENT:    1. Chronic heart failure with preserved ejection fraction ([place])   2. PVD (peripheral vascular disease) ([place])   3. Tobacco abuse   4. Coronary artery disease involving native coronary artery of native heart without angina pectoris   5. Aneurysm of ascending aorta without rupture   6. Essential hypertension   7. Bilateral carotid artery stenosis    PLAN:    In order of problems listed above:  #Acute on Chronic diastolic heart failure: TTE 03/10/20 with LVEF 55-60%, mild  LVH, aortic sclerosis, normal RAP. Reports 9lbs weight gain with mild LE edema. Does not appear overtly decompensated but may have some intraabdominal fluid. Will check BNP and adjust lasix dosing accordingly. -Check BNP today  -Will adjust lasix dosing pending labs; continue lasix 69m daily for now as does not appear grossly overloaded on exam -Continue farxiga 128mdaily -Continue spironolactone 2559maily -Low Na diet  #Nonobstructive  CAD: Nonobstructive coronary disease by cardiac catheterization in 2014.  Myoview in 6/22 was low risk. No anginal symptoms. -Continue ASA 45m55mily -Continue lipitor 80mg22mly -Continue zetia 10mg 11my  #PVCs: <1% burden on monitor. On verapamil. -Continue verapamil 240mg d90m  #PAD: S/p PAD s/p L fem-pop bypass, L CIA and EIA stenting 02/2020, R CIA stent, RFA endartecetomy and right fem-pop bypass. Followed by Vascular Surgery. -Management per vascular surgery -Continue ASA 45mg da52m-Continue lipitor 80mg dai41mContinue zetia 10mg dail23mThoracic Aortic Aneurysm: #AAA: Followed by Vascular surgery. Thoracic aortic aneurysm measures 44mm by CT51m2022. -Continue ASA 45mg daily 73mtinue lipitor 80mg daily -62minue zetia 10mg daily -Y32my monitoring  #HTN: Elevated in the office but better controlled at home. -Continue verapamil 240mg daily -Co28mue spironolactone 25mg daily -Can60must if BP persistently >130/90 at home  #HLD: -Continue ASA 45mg daily -Cont45m lipitor 80mg daily -Conti102mzetia 10mg daily -LDL 668mobacco Abuse: Over 50 pack year smoking history with current tobacco use.  -Will continue with yearly CT screenings   Medication Adjustments/Labs and Tests Ordered: Current medicines are reviewed at length with the patient today.  Concerns regarding medicines are outlined above.  Orders Placed This Encounter  Procedures   Basic metabolic panel   Pro b natriuretic peptide (BNP)   No orders of the defined types were placed in this encounter.   Patient Instructions  Medication Instructions:  Your physician recommends that you continue on your current medications as directed. Please refer to the Current Medication list given to you today.  *If you need a refill on your cardiac medications before your next appointment, please call your pharmacy*   Lab Work: TODAY:  BMET & PRO BNP  If you have labs (blood work) drawn today and your  tests are completely normal, you will receive your results only by: MyChart Message (ifRound Toprt) OR A paper copy in the mail If you have any lab test that is abnormal or we need to change your treatment, we will call you to review the results.   Testing/Procedures: None ordered   Follow-Up: At [facility], [facility] needs are our priority.  As part of our continuing mission to provide you with exceptional heart care, we have created designated Provider Care Teams.  These Care Teams include your primary Cardiologist (physician) and Advanced Practice Providers (APPs -  Physician Assistants and Nurse Practitioners) who all work together to provide you with the care you need, when you need it.  We recommend signing up for the patient portal called "MyChart".  Sign up information is provided on this After Visit Summary.  MyChart is used to connect with patients for Virtual Visits (Telemedicine).  Patients are able to view lab/test results, encounter notes, upcoming appointments, etc.  Non-urgent messages can be sent to your provider as well.   To learn more about what you can do with MyChart, go to [email or web address]   3 month(s)  01/26/22 ARRIVE AT 8:25  The format for your next appointment:   In  Person  Provider:   Freada Bergeron, MD  or Melina Copa, PA-C, Ermalinda Barrios, PA-C, or Christen Bame, NP         Other Instructions    Signed, Freada Bergeron, MD  10/30/2021 10:17 AM    [place]

## 2021-10-24 DIAGNOSIS — Z79899 Other long term (current) drug therapy: Secondary | ICD-10-CM | POA: Diagnosis not present

## 2021-10-24 DIAGNOSIS — Z79891 Long term (current) use of opiate analgesic: Secondary | ICD-10-CM | POA: Diagnosis not present

## 2021-10-24 DIAGNOSIS — M47817 Spondylosis without myelopathy or radiculopathy, lumbosacral region: Secondary | ICD-10-CM | POA: Diagnosis not present

## 2021-10-24 DIAGNOSIS — G894 Chronic pain syndrome: Secondary | ICD-10-CM | POA: Diagnosis not present

## 2021-10-24 DIAGNOSIS — M160 Bilateral primary osteoarthritis of hip: Secondary | ICD-10-CM | POA: Diagnosis not present

## 2021-10-24 DIAGNOSIS — G629 Polyneuropathy, unspecified: Secondary | ICD-10-CM | POA: Diagnosis not present

## 2021-10-30 ENCOUNTER — Encounter: Payer: Self-pay | Admitting: Cardiology

## 2021-10-30 ENCOUNTER — Other Ambulatory Visit: Payer: Self-pay

## 2021-10-30 ENCOUNTER — Ambulatory Visit: Payer: Medicare Other | Admitting: Cardiology

## 2021-10-30 VITALS — BP 150/80 | HR 72 | Ht 68.0 in | Wt 123.4 lb

## 2021-10-30 DIAGNOSIS — I2510 Atherosclerotic heart disease of native coronary artery without angina pectoris: Secondary | ICD-10-CM | POA: Diagnosis not present

## 2021-10-30 DIAGNOSIS — I5032 Chronic diastolic (congestive) heart failure: Secondary | ICD-10-CM | POA: Diagnosis not present

## 2021-10-30 DIAGNOSIS — I739 Peripheral vascular disease, unspecified: Secondary | ICD-10-CM

## 2021-10-30 DIAGNOSIS — Z720 Tobacco use: Secondary | ICD-10-CM

## 2021-10-30 DIAGNOSIS — I10 Essential (primary) hypertension: Secondary | ICD-10-CM

## 2021-10-30 DIAGNOSIS — I7121 Aneurysm of the ascending aorta, without rupture: Secondary | ICD-10-CM

## 2021-10-30 DIAGNOSIS — I6523 Occlusion and stenosis of bilateral carotid arteries: Secondary | ICD-10-CM

## 2021-10-30 NOTE — Patient Instructions (Addendum)
Medication Instructions:  Your physician recommends that you continue on your current medications as directed. Please refer to the Current Medication list given to you today.  *If you need a refill on your cardiac medications before your next appointment, please call your pharmacy*   Lab Work: TODAY:  BMET & PRO BNP  If you have labs (blood work) drawn today and your tests are completely normal, you will receive your results only by: [place] (if you have MyChart) OR A paper copy in the mail If you have any lab test that is abnormal or we need to change your treatment, we will call you to review the results.   Testing/Procedures: None ordered   Follow-Up: At [facility], you and your health needs are our priority.  As part of our continuing mission to provide you with exceptional heart care, we have created designated Provider Care Teams.  These Care Teams include your primary Cardiologist (physician) and Advanced Practice Providers (APPs -  Physician Assistants and Nurse Practitioners) who all work together to provide you with the care you need, when you need it.  We recommend signing up for the patient portal called "MyChart".  Sign up information is provided on this After Visit Summary.  MyChart is used to connect with patients for Virtual Visits (Telemedicine).  Patients are able to view lab/test results, encounter notes, upcoming appointments, etc.  Non-urgent messages can be sent to your provider as well.   To learn more about what you can do with MyChart, go to [email or web address].    Your next appointment:   3 month(s)  01/26/22 ARRIVE AT 8:25  The format for your next appointment:   In Person  Provider:   Freada Bergeron, MD  or Melina Copa, PA-C, Ermalinda Barrios, PA-C, or Christen Bame, NP         Other Instructions

## 2021-10-31 LAB — BASIC METABOLIC PANEL
BUN/Creatinine Ratio: 30 — ABNORMAL HIGH (ref 12–28)
BUN: 20 mg/dL (ref 8–27)
CO2: 28 mmol/L (ref 20–29)
Calcium: 9.5 mg/dL (ref 8.7–10.3)
Chloride: 96 mmol/L (ref 96–106)
Creatinine, Ser: 0.66 mg/dL (ref 0.57–1.00)
Glucose: 88 mg/dL (ref 70–99)
Potassium: 4.2 mmol/L (ref 3.5–5.2)
Sodium: 134 mmol/L (ref 134–144)
eGFR: 93 mL/min/{1.73_m2} (ref >59)

## 2021-10-31 LAB — PRO B NATRIURETIC PEPTIDE: NT-Pro BNP: 239 pg/mL (ref 0–301)

## 2021-11-05 IMAGING — CR DG LUMBAR SPINE COMPLETE 4+V
5 series · 5 of 5 positions shown · non-contrast
Comparison: None.

CLINICAL DATA: Chronic low back pain with bilateral lower extremity
radiculopathy, left greater than right

EXAM:
LUMBAR SPINE - COMPLETE 4+ VIEW

[t l-spine a.p.]
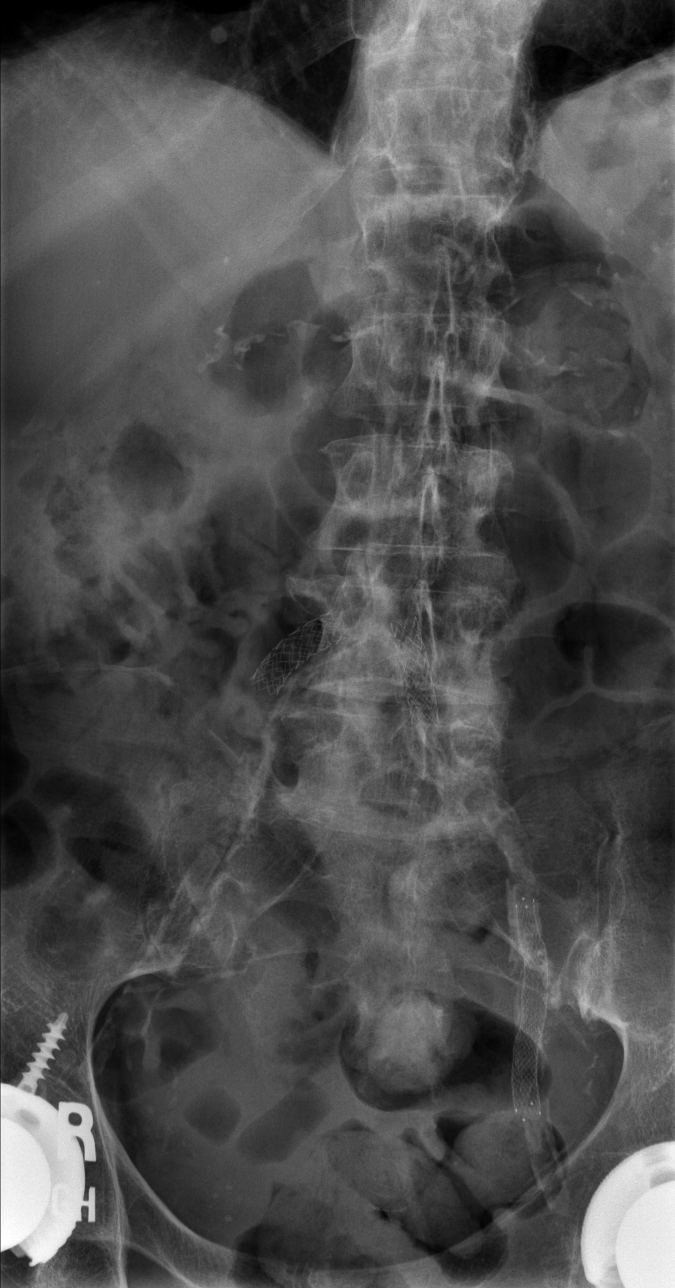

[t l-spine oblique exposure (1 of 2)]
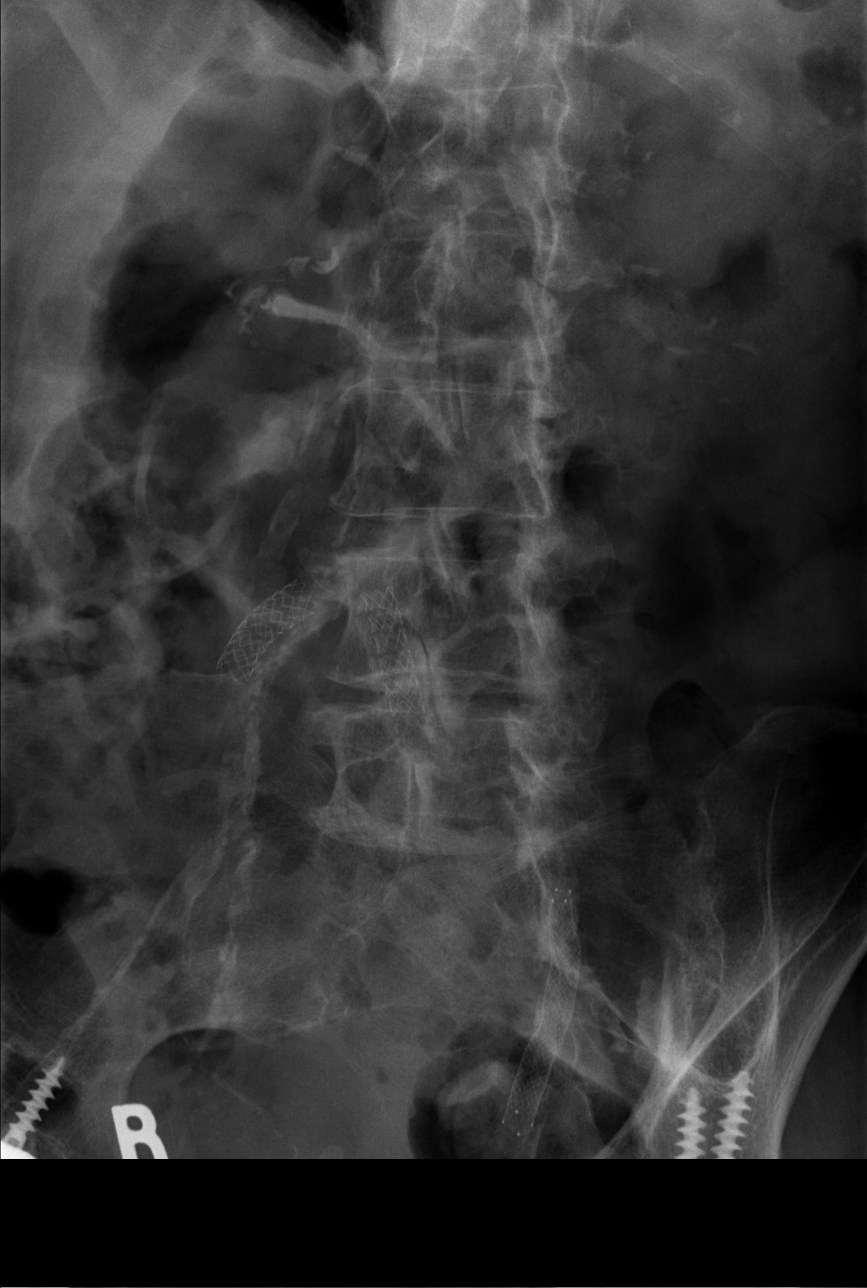

[t l-spine oblique exposure (2 of 2)]
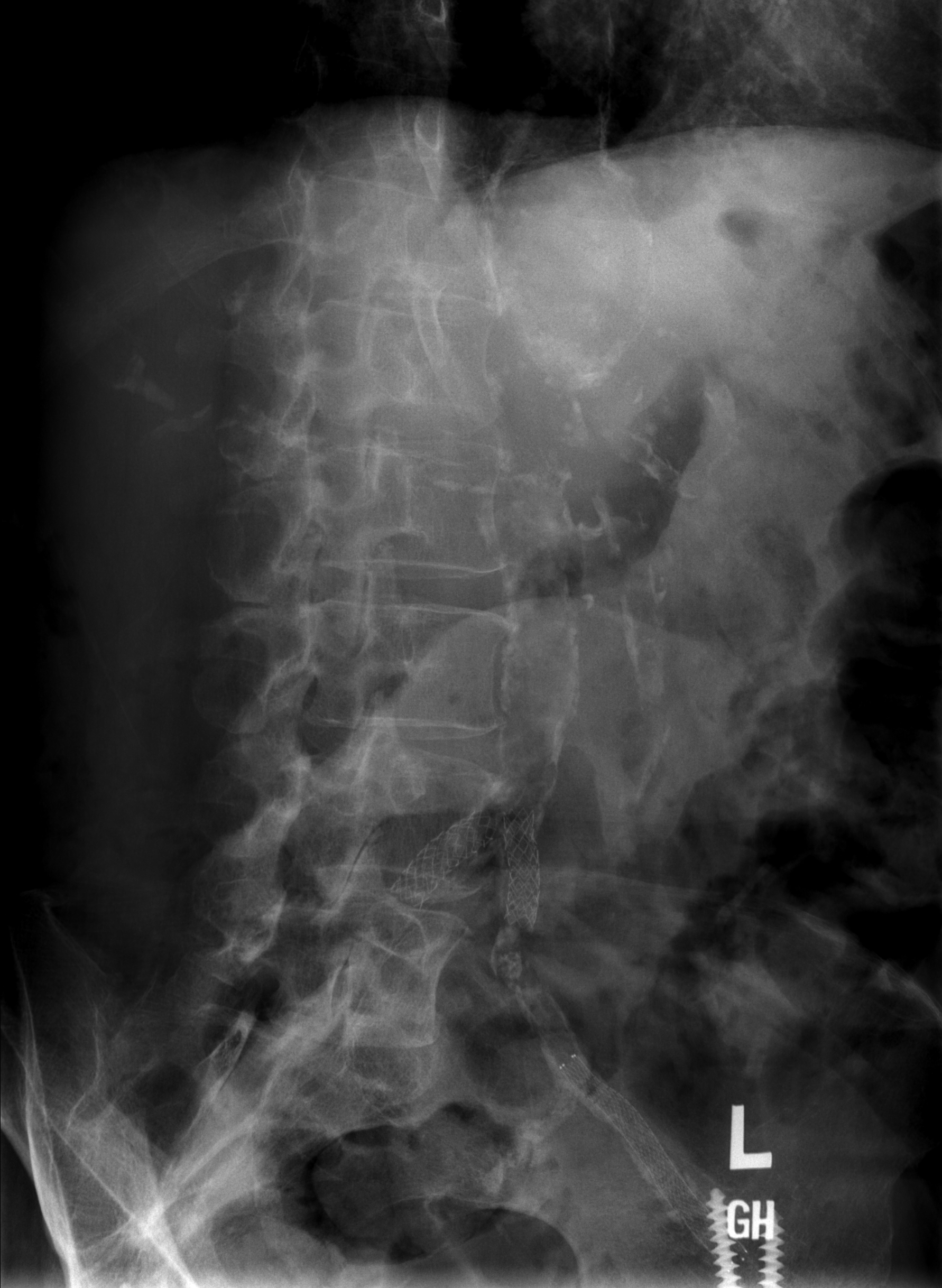

[t l-spine lat]
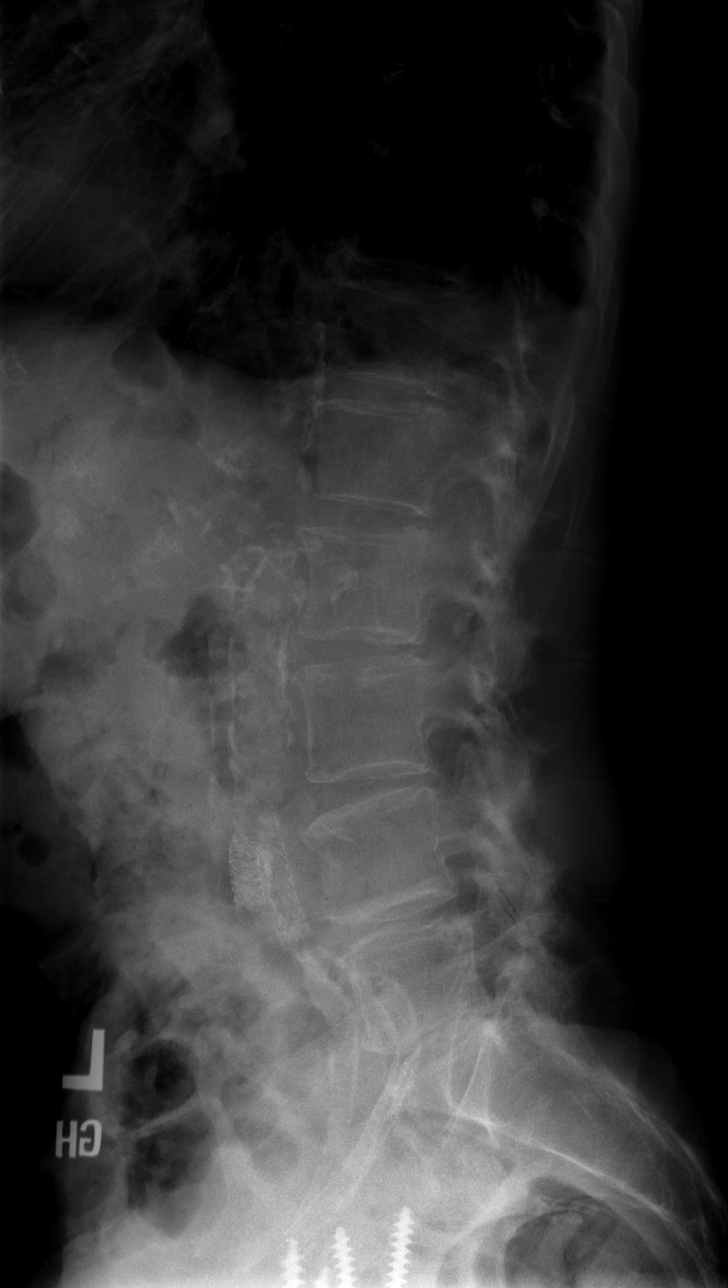

[t l-spine l5-s1 spot]
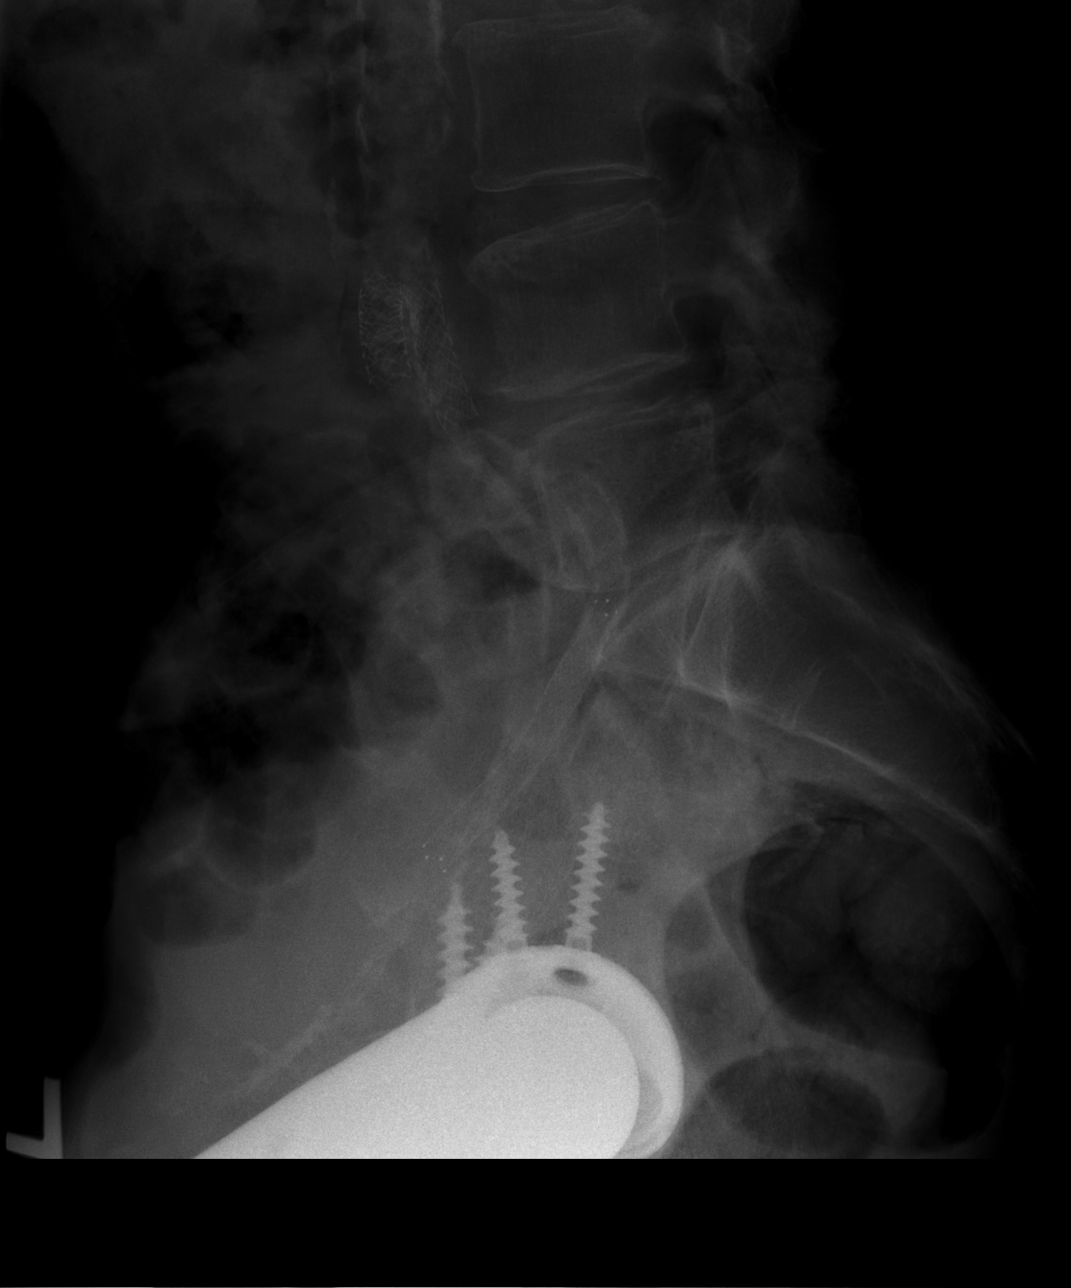

[5 of 5 positions shown; findings below may reference images not displayed]

FINDINGS: Frontal, bilateral oblique, lateral views of the lumbar spine are
obtained. There are 5 non-rib-bearing lumbar type vertebral bodies
in normal anatomic alignment. There are no acute displaced
fractures. There is mild spondylosis at L4-5 and L5-S1. Moderate to
severe facet hypertrophic changes are seen from L3-4 through L5-S1.
Sacroiliac joints are unremarkable. Bilateral hip arthroplasties are
partially visualized. There are extensive vascular calcifications.
IMPRESSION: 1. Lower lumbar spondylosis and facet hypertrophy as above. No acute
bony abnormality.

## 2021-11-09 DIAGNOSIS — J381 Polyp of vocal cord and larynx: Secondary | ICD-10-CM | POA: Diagnosis not present

## 2021-11-09 DIAGNOSIS — F17210 Nicotine dependence, cigarettes, uncomplicated: Secondary | ICD-10-CM | POA: Diagnosis not present

## 2021-11-09 DIAGNOSIS — R490 Dysphonia: Secondary | ICD-10-CM | POA: Diagnosis not present

## 2021-11-20 DIAGNOSIS — J432 Centrilobular emphysema: Secondary | ICD-10-CM | POA: Diagnosis not present

## 2021-11-20 DIAGNOSIS — I7123 Aneurysm of the descending thoracic aorta, without rupture: Secondary | ICD-10-CM | POA: Diagnosis not present

## 2021-11-20 DIAGNOSIS — I739 Peripheral vascular disease, unspecified: Secondary | ICD-10-CM | POA: Diagnosis not present

## 2021-11-20 DIAGNOSIS — I700 Atherosclerosis of aorta: Secondary | ICD-10-CM | POA: Diagnosis not present

## 2021-11-20 DIAGNOSIS — I10 Essential (primary) hypertension: Secondary | ICD-10-CM | POA: Diagnosis not present

## 2021-11-20 DIAGNOSIS — R7303 Prediabetes: Secondary | ICD-10-CM | POA: Diagnosis not present

## 2021-11-20 DIAGNOSIS — Z79899 Other long term (current) drug therapy: Secondary | ICD-10-CM | POA: Diagnosis not present

## 2021-11-20 DIAGNOSIS — I5030 Unspecified diastolic (congestive) heart failure: Secondary | ICD-10-CM | POA: Diagnosis not present

## 2021-11-20 DIAGNOSIS — M109 Gout, unspecified: Secondary | ICD-10-CM | POA: Diagnosis not present

## 2021-11-20 DIAGNOSIS — E7800 Pure hypercholesterolemia, unspecified: Secondary | ICD-10-CM | POA: Diagnosis not present

## 2021-11-30 DIAGNOSIS — I739 Peripheral vascular disease, unspecified: Secondary | ICD-10-CM | POA: Diagnosis not present

## 2021-11-30 DIAGNOSIS — G894 Chronic pain syndrome: Secondary | ICD-10-CM | POA: Diagnosis not present

## 2021-11-30 DIAGNOSIS — G629 Polyneuropathy, unspecified: Secondary | ICD-10-CM | POA: Diagnosis not present

## 2021-12-20 DIAGNOSIS — F17210 Nicotine dependence, cigarettes, uncomplicated: Secondary | ICD-10-CM | POA: Diagnosis not present

## 2021-12-20 DIAGNOSIS — M79605 Pain in left leg: Secondary | ICD-10-CM | POA: Diagnosis not present

## 2021-12-20 DIAGNOSIS — Z79891 Long term (current) use of opiate analgesic: Secondary | ICD-10-CM | POA: Diagnosis not present

## 2021-12-20 DIAGNOSIS — M79604 Pain in right leg: Secondary | ICD-10-CM | POA: Diagnosis not present

## 2021-12-20 DIAGNOSIS — G8929 Other chronic pain: Secondary | ICD-10-CM | POA: Diagnosis not present

## 2021-12-20 DIAGNOSIS — Z79899 Other long term (current) drug therapy: Secondary | ICD-10-CM | POA: Diagnosis not present

## 2021-12-20 DIAGNOSIS — M25551 Pain in right hip: Secondary | ICD-10-CM | POA: Diagnosis not present

## 2021-12-20 DIAGNOSIS — M25552 Pain in left hip: Secondary | ICD-10-CM | POA: Diagnosis not present

## 2022-01-17 DIAGNOSIS — Z681 Body mass index (BMI) 19 or less, adult: Secondary | ICD-10-CM | POA: Diagnosis not present

## 2022-01-17 DIAGNOSIS — I739 Peripheral vascular disease, unspecified: Secondary | ICD-10-CM | POA: Diagnosis not present

## 2022-01-17 DIAGNOSIS — G8929 Other chronic pain: Secondary | ICD-10-CM | POA: Diagnosis not present

## 2022-01-17 DIAGNOSIS — R030 Elevated blood-pressure reading, without diagnosis of hypertension: Secondary | ICD-10-CM | POA: Diagnosis not present

## 2022-01-17 DIAGNOSIS — Z0000 Encounter for general adult medical examination without abnormal findings: Secondary | ICD-10-CM | POA: Diagnosis not present

## 2022-01-17 DIAGNOSIS — M25551 Pain in right hip: Secondary | ICD-10-CM | POA: Diagnosis not present

## 2022-01-17 DIAGNOSIS — M25552 Pain in left hip: Secondary | ICD-10-CM | POA: Diagnosis not present

## 2022-01-17 DIAGNOSIS — Z79899 Other long term (current) drug therapy: Secondary | ICD-10-CM | POA: Diagnosis not present

## 2022-01-23 NOTE — Progress Notes (Deleted)
?Cardiology Office Note:   ? ?Date:  01/23/2022  ? ?ID:  Marie Johnson, DOB [birth year], MRN [id number] ? ?PCP:  Leonides Sake, MD ?  ?[facility] Providers ?Cardiologist:  Freada Bergeron, MD ?Cardiology APP:  Liliane Shi, PA-C  ?Electrophysiologist:  Thompson Grayer, MD  ?{ ? ?Referring MD: Leonides Sake, MD  ? ? ?History of Present Illness:   ? ?Marie Johnson is a 71 y.o. female with a hx of nonobstructive CAD, diastolic heart failure, PAD s/p L fem-pop bypass, L CIA and EIA stenting 02/2020, R CIA stent, RFA endartecetomy and right fem-pop bypass, AAA, COPD, HTN, HLD, and tobacco use who was previously followed by Dr. Meda Coffee who now returns to clinic for follow-up. ? ?Was seen by me on 01/2021 where she was having worsening DOE and ECG showed frequent PVCs. We obtained myoview which was low risk. Zio showed NSR, <1% PVC burden. CT for lung cancer screening revealed thoracic aneurysm 55m.  ? ?Last seen in clinic on 10/2021 where she was having LE pain due to chronic PAD, peripheral neuopathy and arthritis. Was seeing pain center. LE dopplers 07/2021 normal right ABI, abnormal TBI. Left ABI with mild LE disease and abnormal TBI. BNP normal. ? ?Today, *** ? ? ?Past Medical History:  ?Diagnosis Date  ? AAA (abdominal aortic aneurysm)   ? 3.4 cm  ? Anemia   ? Anxiety   ? Aortic atherosclerosis ([place]   ? Arthritis   ? Asthma   ? CAD (coronary artery disease)   ? a. Nonobstructive CAD by cath 07/17/13.  ? Carotid artery disease ([place]   ? Carotid UKorea8/22: Bilateral ICA 1-39; right subclavian stenosis; right thyroid nodule  ? CHD (congenital heart disease)   ? pt unaware???  ? Chest pain   ? a. Adm 10-07/2013 - CTA neg for PE/dissection, cath with nonobstructive disease, CP ?due to uncontrolled HTN vs coronary vasospasm.  ? CHF (congestive heart failure) ([place]   ? COPD (chronic obstructive pulmonary disease) ([place]   ? Coronary artery disease   ? Echocardiogram   ? Echocardiogram 06/2019: EF 60-65, impaired  relaxation (Gr 1 DD), mild MAC, mild MR, mild to mod aortic valve sclerosis (no AS), trivial TR, mild LAE, normal RVSF, RVSP 30.7 (mildly elevated)  ? GERD (gastroesophageal reflux disease)   ? Hyperlipidemia   ? Hypertension   ? Neuropathy   ? OA (osteoarthritis)   ? Osteoporosis   ? PAD (peripheral artery disease) ([place]   ? a. s/p L [facility]  ? PAD (peripheral artery disease) ([place]   ? Palpitations 02/22/2016  ? Pneumonia   ? Pre-diabetes   ? Premature ventricular contraction   ? PUD (peptic ulcer disease)   ? PVD (peripheral vascular disease) ([facility] 07/17/2013  ? Tobacco abuse   ? Wears dentures   ? Wears glasses   ? ? ?Past Surgical History:  ?Procedure Laterality Date  ? ABDOMINAL AORTOGRAM W/LOWER EXTREMITY N/A 03/09/2020  ? Procedure: ABDOMINAL AORTOGRAM W/LOWER EXTREMITY;  Surgeon: CMarty Heck MD;  [facility];  Service: Cardiovascular;  Laterality: N/A;  ? ABDOMINAL HYSTERECTOMY    ? BREAST SURGERY    ? left cyst excision x 2  ? CARDIAC CATHETERIZATION    ? several years ago, nonobstructive, "50-60% blockages"  ? ENDARTERECTOMY FEMORAL Right 03/18/2020  ? Procedure: ENDARTERECTOMY FEMORAL WITH PROFUNDAPLASTY;  Surgeon: CMarty Heck MD;  [place]  Service:  Vascular;  Laterality: Right;  ? FEMORAL-POPLITEAL BYPASS GRAFT Right 03/18/2020  ? Procedure: BYPASS GRAFT FEMORAL-POPLITEAL ARTERY using GORE PROPATEN VASCULAR GRAFT;  Surgeon: [name], MD;  Location: [place];  Service: Vascular;  Laterality: Right;  ? HIP SURGERY    ? left - bars and screws placed  ? HIP SURGERY    ? INSERTION OF ILIAC STENT Right 03/18/2020  ? Procedure: INSERTION OF GORE VIABAHN  ILIAC STENT;  Surgeon: [name], MD;  Location: [place];  Service: Vascular;  Laterality: Right;  ? LEFT HEART CATHETERIZATION WITH CORONARY ANGIOGRAM N/A 07/17/2013  ? Procedure: LEFT HEART CATHETERIZATION WITH CORONARY ANGIOGRAM;  Surgeon:  Larey Dresser, MD;  Location: [facility] CATH LAB;  Service: Cardiovascular;  Laterality: N/A;  ? LOWER EXTREMITY ANGIOGRAM Right 03/18/2020  ? Procedure: RIGHT ILIAC ARTERIOGRAM WITH AN ILIAC STENT;  Surgeon: [name], MD;  Location: [place];  Service: Vascular;  Laterality: Right;  ? MULTIPLE TOOTH EXTRACTIONS    ? PATCH ANGIOPLASTY Right 03/18/2020  ? Procedure: PATCH ANGIOPLASTY using a [place] OF THE PROFUNDA;  Surgeon: [name], MD;  Location: [place];  Service: Vascular;  Laterality: Right;  ? PERIPHERAL VASCULAR CATHETERIZATION N/A 07/16/2016  ? Procedure: Abdominal Aortogram w/Lower Extremity;  Surgeon: Rosetta Posner, MD;  Location: [place] [facility] LAB;  Service: Cardiovascular;  Laterality: N/A;  ? PERIPHERAL VASCULAR INTERVENTION Left 03/09/2020  ? Procedure: PERIPHERAL VASCULAR INTERVENTION;  Surgeon: [name], MD;  Location: [place] CV LAB;  Service: Cardiovascular;  Laterality: Left;  common and external illiac   ? ROTATOR CUFF REPAIR    ? right side, had torn ligaments as well  ? TUBAL LIGATION    ? VEIN BYPASS SURGERY    ? left leg  ? WRIST ARTHROCENTESIS    ? left - broke in 3 places so has bars and screws placed  ? ? ?Current Medications: ?No outpatient medications have been marked as taking for the 01/26/22 encounter (Appointment) with Freada Bergeron, MD.  ?  ? ?Allergies:   Bee venom, Bee venom, Insect extract allergy skin test, Nsaids, Latex, and Latex  ? ?Social History  ? ?Socioeconomic History  ? Marital status: Married  ?  Spouse name: Not on file  ? Number of children: Not on file  ? Years of education: Not on file  ? Highest education level: Not on file  ?Occupational History  ? Not on file  ?Tobacco Use  ? Smoking status: Every Day  ?  Packs/day: 0.25  ?  Years: 50.00  ?  Pack years: 12.50  ?  Types: Cigarettes  ? Smokeless tobacco: Never  ? Tobacco comments:  ?  1-2 cigarettes Daily  ?Vaping Use  ? Vaping Use: Never used  ?Substance and Sexual  Activity  ? Alcohol use: Never  ? Drug use: Never  ? Sexual activity: Not on file  ?Other Topics Concern  ? Not on file  ?Social History Narrative  ? ** Merged History Encounter **  ?    ? Pt lives in [place] with husband.  Retired Educational psychologist  ? ?Social Determinants of Health  ? ?Financial Resource Strain: Not on file  ?Food Insecurity: Not on file  ?Transportation Needs: Not on file  ?Physical Activity: Not on file  ?Stress: Not on file  ?Social Connections: Not on file  ?  ? ?Family History: ?The patient's family history includes Breast cancer in her maternal grandmother; Cervical cancer in her  mother; Colon cancer in her maternal aunt; Heart disease in her father and mother; Liver cancer in her father; Lung cancer in her maternal aunt, maternal aunt, maternal aunt, and maternal uncle; Prostate cancer in her maternal uncle; Stomach cancer in her maternal aunt. ? ?ROS:   ?Please see the history of present illness.    ?Review of Systems  ?Constitutional:  Negative for chills and fever.  ?Respiratory:  Positive for shortness of breath.   ?Cardiovascular:  Positive for claudication and leg swelling. Negative for chest pain, palpitations, orthopnea and PND.  ?Gastrointestinal:  Positive for heartburn. Negative for nausea and vomiting.  ?Genitourinary:  Negative for dysuria.  ?Musculoskeletal:  Positive for joint pain.  ?Neurological:  Negative for dizziness and loss of consciousness.  ?Endo/Heme/Allergies:  Negative for polydipsia.  ?Psychiatric/Behavioral:  Negative for depression.   ? ?EKGs/Labs/Other Studies Reviewed:   ? ?The following studies were reviewed today: ?LONG TERM MONITOR (3-7 DAYS) INTERPRETATION 02/28/2021 ?Narrative ?? Patch wear time was 3 days and 4 hours. ?? Predominant rhythm was NSR with average HR 92 (ranging from 59-150bpm) ?? There were 3 runs of SVT with the longest lasting 7 beats at average rate of 126bpm; fastest was 7 beats at rate 150bpm ?? Patient triggered events mainly correlated  with sinus tachycardia, PACs and PVCs ?? Rare SVEs (<1%) and rare PVCs (<1%) ?  ?Chest CT 02/17/21 ?4.4 cm distal descending thoracic aortic aneurysm.  ?Aortic Atherosclerosis  ?  ?GATED SPECT MYO PERF W/LE

## 2022-01-26 ENCOUNTER — Ambulatory Visit: Payer: Medicare Other | Admitting: Cardiology

## 2022-01-26 ENCOUNTER — Encounter: Payer: Self-pay | Admitting: Cardiology

## 2022-01-26 VITALS — BP 152/80 | HR 82 | Ht 68.0 in | Wt 125.6 lb

## 2022-01-26 DIAGNOSIS — Z720 Tobacco use: Secondary | ICD-10-CM

## 2022-01-26 DIAGNOSIS — I7121 Aneurysm of the ascending aorta, without rupture: Secondary | ICD-10-CM | POA: Diagnosis not present

## 2022-01-26 DIAGNOSIS — I739 Peripheral vascular disease, unspecified: Secondary | ICD-10-CM | POA: Diagnosis not present

## 2022-01-26 DIAGNOSIS — I10 Essential (primary) hypertension: Secondary | ICD-10-CM

## 2022-01-26 DIAGNOSIS — I493 Ventricular premature depolarization: Secondary | ICD-10-CM

## 2022-01-26 DIAGNOSIS — I2510 Atherosclerotic heart disease of native coronary artery without angina pectoris: Secondary | ICD-10-CM | POA: Diagnosis not present

## 2022-01-26 DIAGNOSIS — I5032 Chronic diastolic (congestive) heart failure: Secondary | ICD-10-CM

## 2022-01-26 NOTE — Patient Instructions (Signed)
Medication Instructions:   Your physician recommends that you continue on your current medications as directed. Please refer to the Current Medication list given to you today.  *If you need a refill on your cardiac medications before your next appointment, please call your pharmacy*   Follow-Up: At [facility], you and your health needs are our priority.  As part of our continuing mission to provide you with exceptional heart care, we have created designated Provider Care Teams.  These Care Teams include your primary Cardiologist (physician) and Advanced Practice Providers (APPs -  Physician Assistants and Nurse Practitioners) who all work together to provide you with the care you need, when you need it.  We recommend signing up for the patient portal called "MyChart".  Sign up information is provided on this After Visit Summary.  MyChart is used to connect with patients for Virtual Visits (Telemedicine).  Patients are able to view lab/test results, encounter notes, upcoming appointments, etc.  Non-urgent messages can be sent to your provider as well.   To learn more about what you can do with MyChart, go to [email or web address].    Your next appointment:   6 month(s)  The format for your next appointment:   In Person  Provider:   Heather E Pemberton, MD {   Important Information About Sugar       

## 2022-01-26 NOTE — Progress Notes (Signed)
?Cardiology Office Note:   ? ?Date:  01/26/2022  ? ?ID:  Marie Johnson, DOB [birth year], MRN [id number] ? ?PCP:  Leonides Sake, MD ?  ?[facility] Providers ?Cardiologist:  Freada Bergeron, MD ?Cardiology APP:  Liliane Shi, PA-C  ?Electrophysiologist:  Thompson Grayer, MD  ?{ ? ?Referring MD: Leonides Sake, MD  ? ? ?History of Present Illness:   ? ?Marie Johnson is a 71 y.o. female with a hx of nonobstructive CAD, diastolic heart failure, PAD s/p L fem-pop bypass, L CIA and EIA stenting 02/2020, R CIA stent, RFA endartecetomy and right fem-pop bypass, AAA, COPD, HTN, HLD, and tobacco use who was previously followed by Dr. Meda Coffee who now returns to clinic for follow-up. ? ?Was seen by me on 01/2021 where she was having worsening DOE and ECG showed frequent PVCs. We obtained myoview which was low risk. Zio showed NSR, <1% PVC burden. CT for lung cancer screening revealed thoracic aneurysm 28m.  ? ?Last seen in clinic on 10/2021 where she was having LE pain due to chronic PAD, peripheral neuopathy and arthritis. Was seeing pain center. LE dopplers 07/2021 normal right ABI, abnormal TBI. Left ABI with mild LE disease and abnormal TBI. BNP normal. ? ?Today, the patient states that she believes she is okay. She is concerned about her bilateral LE edema with erythema and soreness/numbness. Her swelling has been stable. She has compression socks which she uses as much as she can, 1-2 times a week. Sometimes they seem to help. However, they are difficult to wear and may cause pain.  ? ?She endorses COPD at baseline, so she is short of breath. Lately with the seasonal changes her shortness of breath and fatigue have worsened. She takes her inhalers and may take benadryl at night for her allergies. Right now she considers her breathing to be normal for her. When she climbs stairs or exerts such as pushing her husband in a wheelchair, she may become short winded and need to rest and use her inhaler. This usually  improves her breathing. ? ?Additionally she believes her neuropathy has worsened. Typically she needs to take 4 tablets of oxycodone every day. ? ?Her blood pressure usually runs 130/70-75 at home. She usually checks this first thing in the morning before she eats. If she notices a headache, this usually means her BP is high so she will check it.  ? ?Regarding her diet, she continues to monitor her sodium intake. Any salt she adds to her meals will be minimal. ? ?In her regimen, she tried taking FIranonce but she was unable to tolerate it as it made her feel ill. ? ?She denies any palpitations, chest pain. No lightheadedness, syncope, orthopnea, or PND. ? ? ?Past Medical History:  ?Diagnosis Date  ? AAA (abdominal aortic aneurysm) ([place]   ? 3.4 cm  ? Anemia   ? Anxiety   ? Aortic atherosclerosis ([place]   ? Arthritis   ? Asthma   ? CAD (coronary artery disease)   ? a. Nonobstructive CAD by cath 07/17/13.  ? Carotid artery disease ([place]   ? Carotid UKorea8/22: Bilateral ICA 1-39; right subclavian stenosis; right thyroid nodule  ? CHD (congenital heart disease)   ? pt unaware???  ? Chest pain   ? a. Adm 10-07/2013 - CTA neg for PE/dissection, cath with nonobstructive disease, CP ?due to uncontrolled HTN vs coronary vasospasm.  ? CHF (congestive heart failure) ([place]   ? COPD (chronic obstructive pulmonary disease) (  [place])   ? Coronary artery disease   ? Echocardiogram   ? Echocardiogram 06/2019: EF 60-65, impaired relaxation (Gr 1 DD), mild MAC, mild MR, mild to mod aortic valve sclerosis (no AS), trivial TR, mild LAE, normal RVSF, RVSP 30.7 (mildly elevated)  ? GERD (gastroesophageal reflux disease)   ? Hyperlipidemia   ? Hypertension   ? Neuropathy   ? OA (osteoarthritis)   ? Osteoporosis   ? PAD (peripheral artery disease) ([place])   ? a. s/p L fem-pop bypass b. recent evaluation at [facility], unamenable to intervention  ? PAD (peripheral artery disease) ([place])   ? Palpitations 02/22/2016  ? Pneumonia   ? Pre-diabetes   ?  Premature ventricular contraction   ? PUD (peptic ulcer disease)   ? PVD (peripheral vascular disease) ([place]) 07/17/2013  ? Tobacco abuse   ? Wears dentures   ? Wears glasses   ? ? ?Past Surgical History:  ?Procedure Laterality Date  ? ABDOMINAL AORTOGRAM W/LOWER EXTREMITY N/A 03/09/2020  ? Procedure: ABDOMINAL AORTOGRAM W/LOWER EXTREMITY;  Surgeon: [name], MD;  Location: [place] CV LAB;  Service: Cardiovascular;  Laterality: N/A;  ? ABDOMINAL HYSTERECTOMY    ? BREAST SURGERY    ? left cyst excision x 2  ? CARDIAC CATHETERIZATION    ? several years ago, nonobstructive, "50-60% blockages"  ? ENDARTERECTOMY FEMORAL Right 03/18/2020  ? Procedure: ENDARTERECTOMY FEMORAL WITH PROFUNDAPLASTY;  Surgeon: [name], MD;  Location: [place];  Service: Vascular;  Laterality: Right;  ? FEMORAL-POPLITEAL BYPASS GRAFT Right 03/18/2020  ? Procedure: BYPASS GRAFT FEMORAL-POPLITEAL ARTERY using GORE PROPATEN VASCULAR GRAFT;  Surgeon: [name], MD;  Location: [place];  Service: Vascular;  Laterality: Right;  ? HIP SURGERY    ? left - bars and screws placed  ? HIP SURGERY    ? INSERTION OF ILIAC STENT Right 03/18/2020  ? Procedure: INSERTION OF GORE VIABAHN  ILIAC STENT;  Surgeon: [name], MD;  Location: [place];  Service: Vascular;  Laterality: Right;  ? LEFT HEART CATHETERIZATION WITH CORONARY ANGIOGRAM N/A 07/17/2013  ? Procedure: LEFT HEART CATHETERIZATION WITH CORONARY ANGIOGRAM;  Surgeon: Larey Dresser, MD;  Location: [facility] CATH LAB;  Service: Cardiovascular;  Laterality: N/A;  ? LOWER EXTREMITY ANGIOGRAM Right 03/18/2020  ? Procedure: RIGHT ILIAC ARTERIOGRAM WITH AN ILIAC STENT;  Surgeon: [name], MD;  Location: [place];  Service: Vascular;  Laterality: Right;  ? MULTIPLE TOOTH EXTRACTIONS    ? PATCH ANGIOPLASTY Right 03/18/2020  ? Procedure: PATCH ANGIOPLASTY using a [place] OF THE PROFUNDA;  Surgeon: [name], MD;  Location: [place];  Service: Vascular;   Laterality: Right;  ? PERIPHERAL VASCULAR CATHETERIZATION N/A 07/16/2016  ? Procedure: Abdominal Aortogram w/Lower Extremity;  Surgeon: Rosetta Posner, MD;  Location: [place] CV LAB;  Service: Cardiovascular;  Laterality: N/A;  ? PERIPHERAL VASCULAR INTERVENTION Left 03/09/2020  ? Procedure: PERIPHERAL VASCULAR INTERVENTION;  Surgeon: [name], MD;  Location: [place] CV LAB;  Service: Cardiovascular;  Laterality: Left;  common and external illiac   ? ROTATOR CUFF REPAIR    ? right side, had torn ligaments as well  ? TUBAL LIGATION    ? VEIN BYPASS SURGERY    ? left leg  ? WRIST ARTHROCENTESIS    ? left - broke in 3 places so has bars and screws placed  ? ? ?Current Medications: ?Current Meds  ?Medication Sig  ? albuterol (PROVENTIL HFA;VENTOLIN HFA) 108 (90 BASE)  MCG/ACT inhaler Inhale 2 puffs into the lungs every 6 (six) hours as needed for wheezing.   ? albuterol (PROVENTIL) (2.5 MG/3ML) 0.083% nebulizer solution Take 2.5 mg by nebulization every 6 (six) hours as needed for wheezing or shortness of breath.  ? alendronate (FOSAMAX) 70 MG tablet Take 70 mg by mouth every Friday.   ? allopurinol (ZYLOPRIM) 300 MG tablet Take 300 mg by mouth daily.   ? aspirin EC 81 MG tablet Take 81 mg by mouth daily.   ? atorvastatin (LIPITOR) 80 MG tablet Take 80 mg by mouth at bedtime.  ? cholecalciferol (VITAMIN D3) 25 MCG (1000 UNIT) tablet Take 5,000 Units by mouth daily.  ? diphenhydrAMINE (BENADRYL) 25 MG tablet Take 50 mg by mouth at bedtime.  ? EPIPEN 2-PAK 0.3 MG/0.3ML SOAJ injection Inject 0.3 mg into the muscle daily as needed. Use as directed as needed for allergies.  ? ezetimibe (ZETIA) 10 MG tablet Take 1 tablet (10 mg total) by mouth daily.  ? furosemide (LASIX) 40 MG tablet Take 1 tablet (40 mg total) by mouth daily. For 5 days only then repeat labs.  ? lactulose (CHRONULAC) 10 GM/15ML solution Take 20 g by mouth as needed for moderate constipation.   ? Menthol-Methyl Salicylate (MUSCLE RUB) 10-15 %  CREA Apply 1 application topically daily as needed (for arthritis pain).  ? nitroGLYCERIN (NITROSTAT) 0.4 MG SL tablet Place 1 tablet (0.4 mg total) under the tongue every 5 (five) minutes x 3 doses as needed

## 2022-02-02 DIAGNOSIS — M810 Age-related osteoporosis without current pathological fracture: Secondary | ICD-10-CM | POA: Diagnosis not present

## 2022-02-06 DIAGNOSIS — F17210 Nicotine dependence, cigarettes, uncomplicated: Secondary | ICD-10-CM | POA: Diagnosis not present

## 2022-02-06 DIAGNOSIS — J3489 Other specified disorders of nose and nasal sinuses: Secondary | ICD-10-CM | POA: Diagnosis not present

## 2022-02-06 DIAGNOSIS — K219 Gastro-esophageal reflux disease without esophagitis: Secondary | ICD-10-CM | POA: Diagnosis not present

## 2022-02-14 DIAGNOSIS — S80812A Abrasion, left lower leg, initial encounter: Secondary | ICD-10-CM | POA: Diagnosis not present

## 2022-02-14 DIAGNOSIS — I739 Peripheral vascular disease, unspecified: Secondary | ICD-10-CM | POA: Diagnosis not present

## 2022-02-14 DIAGNOSIS — L089 Local infection of the skin and subcutaneous tissue, unspecified: Secondary | ICD-10-CM | POA: Diagnosis not present

## 2022-02-14 DIAGNOSIS — L03116 Cellulitis of left lower limb: Secondary | ICD-10-CM | POA: Diagnosis not present

## 2022-02-15 ENCOUNTER — Telehealth: Payer: Self-pay | Admitting: Cardiology

## 2022-02-15 NOTE — Telephone Encounter (Signed)
Placed call to pt regarding message below. CTA order has been in place since 04/2021, pt was just confused when our CT dept called to cancel hers here, since they are relocating.  She understands we will get it scheduled at [place] and someone will call her with the information. Smith Robert, [facility], is working on this.  Pt was very gracious for the call back.

## 2022-02-15 NOTE — Telephone Encounter (Signed)
Patient called stating she needs Dr. Johney Frame to enter the order for her CT scan. She is having it done at [place]

## 2022-02-15 NOTE — Telephone Encounter (Signed)
Looks like [employer] PA-C ordered for pt to have a CT. Will route to [employer] and covering for further follow-up.

## 2022-02-19 DIAGNOSIS — R030 Elevated blood-pressure reading, without diagnosis of hypertension: Secondary | ICD-10-CM | POA: Diagnosis not present

## 2022-02-19 DIAGNOSIS — L03116 Cellulitis of left lower limb: Secondary | ICD-10-CM | POA: Diagnosis not present

## 2022-02-19 DIAGNOSIS — I739 Peripheral vascular disease, unspecified: Secondary | ICD-10-CM | POA: Diagnosis not present

## 2022-02-19 DIAGNOSIS — S80812D Abrasion, left lower leg, subsequent encounter: Secondary | ICD-10-CM | POA: Diagnosis not present

## 2022-02-19 DIAGNOSIS — M25551 Pain in right hip: Secondary | ICD-10-CM | POA: Diagnosis not present

## 2022-02-19 DIAGNOSIS — Z681 Body mass index (BMI) 19 or less, adult: Secondary | ICD-10-CM | POA: Diagnosis not present

## 2022-02-19 DIAGNOSIS — G8929 Other chronic pain: Secondary | ICD-10-CM | POA: Diagnosis not present

## 2022-02-19 DIAGNOSIS — M25552 Pain in left hip: Secondary | ICD-10-CM | POA: Diagnosis not present

## 2022-02-19 DIAGNOSIS — Z79899 Other long term (current) drug therapy: Secondary | ICD-10-CM | POA: Diagnosis not present

## 2022-02-25 DIAGNOSIS — Z79899 Other long term (current) drug therapy: Secondary | ICD-10-CM | POA: Diagnosis not present

## 2022-03-12 DIAGNOSIS — Z9181 History of falling: Secondary | ICD-10-CM | POA: Diagnosis not present

## 2022-03-12 DIAGNOSIS — Z0000 Encounter for general adult medical examination without abnormal findings: Secondary | ICD-10-CM | POA: Diagnosis not present

## 2022-03-12 DIAGNOSIS — E785 Hyperlipidemia, unspecified: Secondary | ICD-10-CM | POA: Diagnosis not present

## 2022-03-27 DIAGNOSIS — Z79891 Long term (current) use of opiate analgesic: Secondary | ICD-10-CM | POA: Diagnosis not present

## 2022-03-27 DIAGNOSIS — R030 Elevated blood-pressure reading, without diagnosis of hypertension: Secondary | ICD-10-CM | POA: Diagnosis not present

## 2022-03-27 DIAGNOSIS — M25552 Pain in left hip: Secondary | ICD-10-CM | POA: Diagnosis not present

## 2022-03-27 DIAGNOSIS — G8929 Other chronic pain: Secondary | ICD-10-CM | POA: Diagnosis not present

## 2022-03-27 DIAGNOSIS — Z79899 Other long term (current) drug therapy: Secondary | ICD-10-CM | POA: Diagnosis not present

## 2022-03-27 DIAGNOSIS — Z681 Body mass index (BMI) 19 or less, adult: Secondary | ICD-10-CM | POA: Diagnosis not present

## 2022-03-27 DIAGNOSIS — M25551 Pain in right hip: Secondary | ICD-10-CM | POA: Diagnosis not present

## 2022-03-27 DIAGNOSIS — I739 Peripheral vascular disease, unspecified: Secondary | ICD-10-CM | POA: Diagnosis not present

## 2022-03-29 DIAGNOSIS — Z79899 Other long term (current) drug therapy: Secondary | ICD-10-CM | POA: Diagnosis not present

## 2022-04-17 ENCOUNTER — Other Ambulatory Visit: Payer: Medicare Other

## 2022-04-17 ENCOUNTER — Other Ambulatory Visit: Payer: Self-pay | Admitting: *Deleted

## 2022-04-17 DIAGNOSIS — I7121 Aneurysm of the ascending aorta, without rupture: Secondary | ICD-10-CM

## 2022-04-17 DIAGNOSIS — I2510 Atherosclerotic heart disease of native coronary artery without angina pectoris: Secondary | ICD-10-CM | POA: Diagnosis not present

## 2022-04-17 DIAGNOSIS — Z0189 Encounter for other specified special examinations: Secondary | ICD-10-CM

## 2022-04-17 LAB — BASIC METABOLIC PANEL
BUN/Creatinine Ratio: 30 — ABNORMAL HIGH (ref 12–28)
BUN: 20 mg/dL (ref 8–27)
CO2: 24 mmol/L (ref 20–29)
Calcium: 9 mg/dL (ref 8.7–10.3)
Chloride: 97 mmol/L (ref 96–106)
Creatinine, Ser: 0.67 mg/dL (ref 0.57–1.00)
Glucose: 93 mg/dL (ref 70–99)
Potassium: 3.7 mmol/L (ref 3.5–5.2)
Sodium: 133 mmol/L — ABNORMAL LOW (ref 134–144)
eGFR: 93 mL/min/{1.73_m2} (ref >59)

## 2022-04-17 NOTE — Progress Notes (Signed)
Order for BMET placed on this pt to have drawn by our lab today. BMET is for upcoming [place], ordered by Richardson Dopp PA-C and per CT protocol. BMET will be drawn on the pt now by lab.

## 2022-04-18 NOTE — Progress Notes (Signed)
Pt has been made aware of normal result and verbalized understanding.  jw

## 2022-04-20 ENCOUNTER — Ambulatory Visit
Admission: RE | Admit: 2022-04-20 | Discharge: 2022-04-20 | Disposition: A | Discharge status: Home / Self Care | Payer: Medicare Other | Source: Ambulatory Visit | Attending: Physician Assistant | Admitting: Physician Assistant

## 2022-04-20 ENCOUNTER — Inpatient Hospital Stay: Admission: RE | Admit: 2022-04-20 | Payer: Medicare Other | Source: Ambulatory Visit

## 2022-04-20 DIAGNOSIS — I2510 Atherosclerotic heart disease of native coronary artery without angina pectoris: Secondary | ICD-10-CM | POA: Diagnosis not present

## 2022-04-20 DIAGNOSIS — I714 Abdominal aortic aneurysm, without rupture, unspecified: Secondary | ICD-10-CM | POA: Diagnosis not present

## 2022-04-20 DIAGNOSIS — I712 Thoracic aortic aneurysm, without rupture, unspecified: Secondary | ICD-10-CM

## 2022-04-20 DIAGNOSIS — J439 Emphysema, unspecified: Secondary | ICD-10-CM | POA: Diagnosis not present

## 2022-04-20 MED ORDER — IOPAMIDOL (ISOVUE-370) INJECTION 76%
75.0000 mL | Freq: Once | INTRAVENOUS | Status: AC | PRN
  Administered 2022-04-20: 75 mL via INTRAVENOUS

## 2022-04-22 ENCOUNTER — Encounter: Payer: Self-pay | Admitting: Physician Assistant

## 2022-04-22 DIAGNOSIS — I712 Thoracic aortic aneurysm, without rupture, unspecified: Secondary | ICD-10-CM | POA: Insufficient documentation

## 2022-04-24 ENCOUNTER — Telehealth: Payer: Self-pay | Admitting: *Deleted

## 2022-04-24 DIAGNOSIS — I7121 Aneurysm of the ascending aorta, without rupture: Secondary | ICD-10-CM

## 2022-04-24 NOTE — Telephone Encounter (Signed)
-----   Message from Liliane Shi, [place] sent at 04/24/2022  7:43 AM EDT ----- Discussed with Dr. Johney Frame. We would like to refer her to cardiothoracic surgery to follow her aneurysm. Please place referral. 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 Richardson Dopp, PA-C    04/24/2022 7:42 AM

## 2022-04-24 NOTE — Progress Notes (Signed)
Discussed with Dr. Johney Frame. We would like to refer her to cardiothoracic surgery to follow her aneurysm. Please place referral. 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 Richardson Dopp, PA-C    04/24/2022 7:42 AM

## 2022-04-27 DIAGNOSIS — M25551 Pain in right hip: Secondary | ICD-10-CM | POA: Diagnosis not present

## 2022-04-27 DIAGNOSIS — I739 Peripheral vascular disease, unspecified: Secondary | ICD-10-CM | POA: Diagnosis not present

## 2022-04-27 DIAGNOSIS — M25552 Pain in left hip: Secondary | ICD-10-CM | POA: Diagnosis not present

## 2022-04-27 DIAGNOSIS — R030 Elevated blood-pressure reading, without diagnosis of hypertension: Secondary | ICD-10-CM | POA: Diagnosis not present

## 2022-04-27 DIAGNOSIS — Z681 Body mass index (BMI) 19 or less, adult: Secondary | ICD-10-CM | POA: Diagnosis not present

## 2022-04-27 DIAGNOSIS — Z79891 Long term (current) use of opiate analgesic: Secondary | ICD-10-CM | POA: Diagnosis not present

## 2022-04-27 DIAGNOSIS — G8929 Other chronic pain: Secondary | ICD-10-CM | POA: Diagnosis not present

## 2022-04-27 DIAGNOSIS — Z79899 Other long term (current) drug therapy: Secondary | ICD-10-CM | POA: Diagnosis not present

## 2022-04-27 DIAGNOSIS — Z6820 Body mass index (BMI) 20.0-20.9, adult: Secondary | ICD-10-CM | POA: Diagnosis not present

## 2022-05-17 ENCOUNTER — Telehealth: Payer: Self-pay

## 2022-05-17 NOTE — Patient Outreach (Signed)
  Care Coordination   Initial Visit Note   01/09/9562 Name: Marie Johnson MRN: [id number] DOB: [birth year]  Marie Johnson is a 71 y.o. year old female who sees Hamrick, Lorin Mercy, MD for primary care. I spoke with  Marie Johnson by phone today.  What matters to the patients health and wellness today?  Placed call to patient and reviewed [facility] care coordination program.  Patient reports that she is doing well and denies any needs at this time.     SDOH assessments and interventions completed:  No     Care Coordination Interventions Activated:  No  Care Coordination Interventions:  No, not indicated   Follow up plan: No further intervention required.   Encounter Outcome:  Pt. Refused   Tomasa Rand, RN, BSN, CEN [facility] [employer] [phone]

## 2022-05-23 DIAGNOSIS — L97921 Non-pressure chronic ulcer of unspecified part of left lower leg limited to breakdown of skin: Secondary | ICD-10-CM | POA: Diagnosis not present

## 2022-05-23 DIAGNOSIS — J449 Chronic obstructive pulmonary disease, unspecified: Secondary | ICD-10-CM | POA: Diagnosis not present

## 2022-05-23 DIAGNOSIS — E042 Nontoxic multinodular goiter: Secondary | ICD-10-CM | POA: Diagnosis not present

## 2022-05-23 DIAGNOSIS — Z139 Encounter for screening, unspecified: Secondary | ICD-10-CM | POA: Diagnosis not present

## 2022-05-23 DIAGNOSIS — M109 Gout, unspecified: Secondary | ICD-10-CM | POA: Diagnosis not present

## 2022-05-23 DIAGNOSIS — E559 Vitamin D deficiency, unspecified: Secondary | ICD-10-CM | POA: Diagnosis not present

## 2022-05-23 DIAGNOSIS — S80812D Abrasion, left lower leg, subsequent encounter: Secondary | ICD-10-CM | POA: Diagnosis not present

## 2022-05-23 DIAGNOSIS — I10 Essential (primary) hypertension: Secondary | ICD-10-CM | POA: Diagnosis not present

## 2022-05-23 DIAGNOSIS — E7800 Pure hypercholesterolemia, unspecified: Secondary | ICD-10-CM | POA: Diagnosis not present

## 2022-05-23 DIAGNOSIS — R7303 Prediabetes: Secondary | ICD-10-CM | POA: Diagnosis not present

## 2022-05-24 ENCOUNTER — Other Ambulatory Visit: Payer: Self-pay | Admitting: Family Medicine

## 2022-05-24 DIAGNOSIS — E042 Nontoxic multinodular goiter: Secondary | ICD-10-CM

## 2022-05-25 NOTE — Progress Notes (Unsigned)
[address]       [place] [address]             [phone]        PCP is Hamrick, Lorin Mercy, MD Referring Provider is Hamrick, Lorin Mercy, MD  Chief Complaint: Ascending thoracic aortic aneurysm   HPI: This is a 71 year old female with a past medical history of AAA, COPD, CHF, CAD mild MR, mild to moderate aortic sclerosis, GERD, hyperlipidemia, hypertension,   neuropathy, tobacco abuse, PAD, OA,  osteoporosis, and pre diabetes who on CTA done 04/20/2022 was found to have a 14 x 8 mm focal saccular aneurysm or penetrating atherosclerotic ulcer involving the left side of proximal portion of the  transverse aortic arch and a 4.4 cm focal fusiform aneurysm.  Patient denies chest or back pain, chest pressure, chest tightness, or shortness of breath.  Past Medical History:  Diagnosis Date   AAA (abdominal aortic aneurysm) (HCC)    3.4 cm   Anemia    Anxiety    Aortic atherosclerosis (HCC)    Arthritis    Asthma    CAD (coronary artery disease)    a. Nonobstructive CAD by cath 07/17/13.   Carotid artery disease ([place])    Carotid US 8/22: Bilateral ICA 1-39; right subclavian stenosis; right thyroid nodule   CHD (congenital heart disease)    pt unaware???   Chest pain    a. Adm 10-07/2013 - CTA neg for PE/dissection, cath with nonobstructive disease, CP ?due to uncontrolled HTN vs coronary vasospasm.   CHF (congestive heart failure) (HCC)    COPD (chronic obstructive pulmonary disease) (HCC)    Coronary artery disease    Echocardiogram    Echocardiogram 06/2019: EF 60-65, impaired relaxation (Gr 1 DD), mild MAC, mild MR, mild to mod aortic valve sclerosis (no AS), trivial TR, mild LAE, normal RVSF, RVSP 30.7 (mildly elevated)   GERD (gastroesophageal reflux disease)    Hyperlipidemia    Hypertension    Neuropathy    OA (osteoarthritis)    Osteoporosis    PAD (peripheral artery disease) (HCC)    a. s/p L fem-pop bypass b. recent evaluation at [facility],  unamenable to intervention   PAD (peripheral artery disease) ([place])    Palpitations 02/22/2016   Pneumonia    Pre-diabetes    Premature ventricular contraction    PUD (peptic ulcer disease)    PVD (peripheral vascular disease) ([place]) 07/17/2013   Thoracic aortic aneurysm (HCC)    Chest/aorta CTA 04/2022: 14 x 8 focal saccular aneurysm versus penetrating atherosclerotic ulcer involving left side of proximal portion of transverse aortic arch; descending thoracic aorta and proximal abdominal aorta aneurysm 4.4 cm; aortic atherosclerosis; emphysema   Tobacco abuse    Wears dentures    Wears glasses     Past Surgical History:  Procedure Laterality Date   ABDOMINAL AORTOGRAM W/LOWER EXTREMITY N/A 03/09/2020   Procedure: ABDOMINAL AORTOGRAM W/LOWER EXTREMITY;  Surgeon: [name], MD;  Location: [place] [facility];  Service: Cardiovascular;  Laterality: N/A;   ABDOMINAL HYSTERECTOMY     BREAST SURGERY     left cyst excision x 2   CARDIAC CATHETERIZATION     several years ago, nonobstructive, "50-60% blockages"   ENDARTERECTOMY FEMORAL Right 03/18/2020   Procedure: ENDARTERECTOMY FEMORAL WITH PROFUNDAPLASTY;  Surgeon: [name], MD;  Location: [facility] OR;  Service: Vascular;  Laterality: Right;   FEMORAL-POPLITEAL BYPASS GRAFT Right 03/18/2020   Procedure: BYPASS GRAFT  FEMORAL-POPLITEAL ARTERY using GORE PROPATEN VASCULAR GRAFT;  Surgeon: [name], MD;  Location: [place];  Service: Vascular;  Laterality: Right;   HIP SURGERY     left - bars and screws placed   [place] Right 03/18/2020   Procedure: INSERTION OF [place];  Surgeon: [name], MD;  Location: [place];  Service: Vascular;  Laterality: Right;   [place] N/A 07/17/2013   Procedure: LEFT HEART CATHETERIZATION WITH CORONARY ANGIOGRAM;  Surgeon: Larey Dresser, MD;  Location: [facility];  Service: Cardiovascular;   Laterality: N/A;   LOWER EXTREMITY ANGIOGRAM Right 03/18/2020   Procedure: RIGHT ILIAC ARTERIOGRAM WITH AN ILIAC STENT;  Surgeon: [name], MD;  Location: [place];  Service: Vascular;  Laterality: Right;   MULTIPLE TOOTH EXTRACTIONS     PATCH ANGIOPLASTY Right 03/18/2020   Procedure: PATCH ANGIOPLASTY using a [place] OF THE PROFUNDA;  Surgeon: [name], MD;  Location: [place];  Service: Vascular;  Laterality: Right;   PERIPHERAL VASCULAR CATHETERIZATION N/A 07/16/2016   Procedure: Abdominal Aortogram w/Lower Extremity;  Surgeon: Rosetta Posner, MD;  Location: [place] [facility];  Service: Cardiovascular;  Laterality: N/A;   PERIPHERAL VASCULAR INTERVENTION Left 03/09/2020   Procedure: PERIPHERAL VASCULAR INTERVENTION;  Surgeon: [name], MD;  Location: [place] [facility];  Service: Cardiovascular;  Laterality: Left;  common and external illiac    ROTATOR CUFF REPAIR     right side, had torn ligaments as well   TUBAL LIGATION     VEIN BYPASS SURGERY     left leg   WRIST ARTHROCENTESIS     left - broke in 3 places so has bars and screws placed    Family History  Problem Relation Age of Onset   Cervical cancer Mother    Heart disease Mother        MI in 77s   Liver cancer Father    Heart disease Father    Lung cancer Maternal Aunt    Prostate cancer Maternal Uncle        met. anal   Breast cancer Maternal Grandmother    Lung cancer Maternal Aunt    Lung cancer Maternal Aunt    Stomach cancer Maternal Aunt    Colon cancer Maternal Aunt    Lung cancer Maternal Uncle     Social History: Social History   Tobacco Use   Smoking status: Every Day    Packs/day: 0.25    Years: 50.00    Total pack years: 12.50    Types: Cigarettes   Smokeless tobacco: Never   Tobacco comments:    1-2 cigarettes Daily  Vaping Use   Vaping Use: Never used  Substance Use Topics   Alcohol use: Never   Drug use: Never    Current Outpatient Medications   Medication Sig Dispense Refill   albuterol (PROVENTIL HFA;VENTOLIN HFA) 108 (90 BASE) MCG/ACT inhaler Inhale 2 puffs into the lungs every 6 (six) hours as needed for wheezing.      albuterol (PROVENTIL) (2.5 MG/3ML) 0.083% nebulizer solution Take 2.5 mg by nebulization every 6 (six) hours as needed for wheezing or shortness of breath.     alendronate (FOSAMAX) 70 MG tablet Take 70 mg by mouth every Friday.      allopurinol (ZYLOPRIM) 300 MG tablet Take 300 mg by mouth daily.      aspirin  EC 81 MG tablet Take 81 mg by mouth daily.      atorvastatin (LIPITOR) 80 MG tablet Take 80 mg by mouth at bedtime.     cholecalciferol (VITAMIN D3) 25 MCG (1000 UNIT) tablet Take 5,000 Units by mouth daily.     dapagliflozin propanediol (FARXIGA) 10 MG TABS tablet Take 1 tablet (10 mg total) by mouth daily before breakfast. (Patient not taking: Reported on 01/26/2022) 30 tablet 3   diphenhydrAMINE (BENADRYL) 25 MG tablet Take 50 mg by mouth at bedtime.     EPIPEN 2-PAK 0.3 MG/0.3ML SOAJ injection Inject 0.3 mg into the muscle daily as needed. Use as directed as needed for allergies.     ezetimibe (ZETIA) 10 MG tablet Take 1 tablet (10 mg total) by mouth daily. 90 tablet 3   furosemide (LASIX) 40 MG tablet Take 1 tablet (40 mg total) by mouth daily. For 5 days only then repeat labs. 30 tablet 0   lactulose (CHRONULAC) 10 GM/15ML solution Take 20 g by mouth as needed for moderate constipation.      Menthol-Methyl Salicylate (MUSCLE RUB) 10-15 % CREA Apply 1 application topically daily as needed (for arthritis pain).     morphine (MSIR) 15 MG tablet Take 15 mg by mouth 6 (six) times daily. (Patient not taking: Reported on 01/26/2022)     nitroGLYCERIN (NITROSTAT) 0.4 MG SL tablet Place 1 tablet (0.4 mg total) under the tongue every 5 (five) minutes x 3 doses as needed for chest pain. 25 tablet 4   Oxycodone HCl 10 MG TABS Take 1 tablet (10 mg total) by mouth 4 (four) times daily as needed (for pain). 20 tablet 0    pantoprazole (PROTONIX) 40 MG tablet Take 40 mg by mouth 2 (two) times daily.     potassium chloride SA (KLOR-CON M20) 20 MEQ tablet Take 1 tablet (20 mEq total) by mouth daily. 90 tablet 3   senna-docusate (SENOKOT-S) 8.6-50 MG tablet Take 2 tablets by mouth at bedtime.     spironolactone (ALDACTONE) 25 MG tablet Take 1 tablet (25 mg total) by mouth daily. 90 tablet 3   verapamil (CALAN-SR) 240 MG CR tablet TAKE 1 TABLET BY MOUTH AT BEDTIME. 90 tablet 3    Facility-Administered Medications Ordered in Other Visits  Medication Dose Route Frequency Provider Last Rate Last Admin   DOBUTamine (DOBUTREX) 1,000 mcg/mL in dextrose 5% 250 mL infusion  30 mcg/kg/min Intravenous Titrated Dorothy Spark, MD        Allergies  Allergen Reactions   Bee Venom Anaphylaxis   Bee Venom Anaphylaxis and Other (See Comments)    Welts, also   Insect Extract Anaphylaxis and Other (See Comments)    Insect bites- Welts, also   Nsaids Nausea Only and Other (See Comments)    Patient is not suppose to have this class of medication (liver)   Latex Rash   Latex Rash    Review of Systems:               Cardiac Review of Systems: Y or  [    ]= no             Chest Pain [  Y, improved  ]  Resting SOB [  Y ]      Exertional SOB  [ Y ]  Orthopnea [  ]             Pedal Edema Aqua.Slicker   ]     Palpitations [ Y ]  Syncope  [  ]    Presyncope [   ]             General Review of Systems: [Y] = yes [  ]=no Constitional: recent weight change [  ]; anorexia [  ]; fatigue [  ]; nausea Aqua.Slicker  ]; night sweats [  ]; fever [  ]; or chills [  ]                                                               Dental: Last Dentist visit:              Eye : blurred vision [  ]; diplopia [   ]; vision changes [  ];  Amaurosis fugax[  ]; Resp: cough [  ];  wheezing[  ];  hemoptysis[  ]; shortness of breath[Y  ]; paroxysmal nocturnal dyspnea[  ]; dyspnea on exertion[  ]; or orthopnea[ Y ];  GI:  gallstones[  ], vomiting[  ];  dysphagia[   ]; melena[  ];  hematochezia [  ]; heartburn[  ];   Hx of  Colonoscopy[  ]; GU: kidney stones [  ]; hematuria[  ];   dysuria [  ];  nocturia[  ];  history of     obstruction [  ]; urinary frequency [  ]             Skin: rash, swelling[N  ];, hair loss[  ];  peripheral edema[y  ];  or itching[  ]; Musculosketetal: myalgias[  ];  joint swelling[  ];  joint erythema[  ];  joint pain[  ];  back pain[  ];             Heme/Lymph: bruising[  ];  bleeding[  ];  anemia[  ];  Neuro: TIA[  ];  headaches[  ];  stroke[  ];  vertigo[  ];  seizures[  ];   paresthesias[  ];  difficulty walking[  ];             Psych:depression[  ]; anxiety[  ];             Endocrine: diabetes[Y  ];  thyroid dysfunction[  ];   Vital Signs:  Physical Exam: CV Pulmonary Abdomen Extremities Neurologic  Diagnostic Tests:   CLINICAL DATA:  Thoracic aortic aneurysm.   EXAM: CT ANGIOGRAPHY CHEST WITH CONTRAST   TECHNIQUE: Multidetector CT imaging of the chest was performed using the standard protocol during bolus administration of intravenous contrast. Multiplanar CT image reconstructions and MIPs were obtained to evaluate the vascular anatomy.   RADIATION DOSE REDUCTION: This exam was performed according to the departmental dose-optimization program which includes automated exposure control, adjustment of the mA and/or kV according to patient size and/or use of iterative reconstruction technique.   CONTRAST:  54m ISOVUE-370 IOPAMIDOL (ISOVUE-370) INJECTION 76%   COMPARISON:  February 17, 2021.   FINDINGS: Cardiovascular: Atherosclerosis of thoracic aorta is noted without dissection. Great vessels are widely patent without significant stenosis. There is noted a 14 x 8 mm focal saccular aneurysm or penetrating atherosclerotic ulcer involving the left side of proximal portion of the transverse aortic arch. Also noted is 4.4 cm focal fusiform aneurysm involving the junction of the distal descending thoracic aorta  and proximal abdominal aorta. Normal cardiac size. No pericardial effusion. Moderate coronary artery calcifications are noted.   Mediastinum/Nodes: No enlarged mediastinal, hilar, or axillary lymph nodes. Thyroid gland, trachea, and esophagus demonstrate no significant findings.   Lungs/Pleura: No pneumothorax or pleural effusion is noted. Mild emphysematous changes noted throughout both lungs. No acute consolidation is noted. Mild bronchial thickening is noted in bilateral lower lobe bronchi suggesting bronchitis.   Upper Abdomen: No acute abnormality.   Musculoskeletal: No chest wall abnormality. No acute or significant osseous findings.   Review of the MIP images confirms the above findings.   IMPRESSION: Grossly stable 14 x 8 mm focal saccular aneurysm or penetrating atherosclerotic ulcer is seen involving the left side of the proximal portion of the transverse aortic arch.   Also noted is stable 4.4 cm focal fusiform aneurysm involving the distal descending thoracic aorta and proximal abdominal aorta. Recommend annual imaging followup by CTA or MRA. This recommendation follows 2010 ACCF/AHA/AATS/ACR/ASA/SCA/SCAI/SIR/STS/SVM Guidelines for the Diagnosis and Management of Patients with Thoracic Aortic Disease. Circulation. 2010; 121: E527-P824. Aortic aneurysm NOS (ICD10-I71.9).   Mild bronchial wall thickening is noted in bilateral lower lobe bronchi concerning for bronchitis.   Aortic Atherosclerosis (ICD10-I70.0) and Emphysema (ICD10-J43.9).     Electronically Signed   By: Marijo Conception M.D.   On: 04/20/2022 11:37    Impression and Plan: Risk Modification in those with ascending thoracic aortic aneurysm:  Continue good control of blood pressure (prefer SBP 130/80 or less)-on Verapamil 240 CR mg daily  2. Avoid fluoroquinolone antibiotics (I.e Ciprofloxacin, Avelox, Levofloxacin, Ofloxacin)  3.  Use of statin (to decrease cardiovascular risk)-on Zetia 10 mg  daily and Atorvastatin 80 mg daily  4.  Exercise and activity limitations is individualized, but in general, contact sports are to be  avoided and one should avoid heavy lifting (defined as half of ideal body weight) and exercises involving sustained Valsalva maneuver.  5. Counseling for those suspected of having genetically mediated disease. First-degree relatives of those with TAA disease should be screened as well as those who have a connective tissue disease (I.e with Marfan syndrome, Ehlers-Danlos syndrome,  and Loeys-Dietz syndrome) or a  bicuspid aortic valve,have an increased risk for  complications related to TAA. The above does not apply  6. If one has tobacco abuse, smoking cessation is highly encouraged.      Nani Skillern, PA-C [facility] [phone]

## 2022-05-28 ENCOUNTER — Institutional Professional Consult (permissible substitution): Payer: Medicare Other | Admitting: Physician Assistant

## 2022-05-28 ENCOUNTER — Ambulatory Visit
Admission: RE | Admit: 2022-05-28 | Discharge: 2022-05-28 | Disposition: A | Discharge status: Home / Self Care | Payer: Medicare Other | Source: Ambulatory Visit | Attending: Family Medicine | Admitting: Family Medicine

## 2022-05-28 VITALS — BP 147/53 | HR 90 | Resp 22 | Ht 68.0 in | Wt 131.0 lb

## 2022-05-28 DIAGNOSIS — E042 Nontoxic multinodular goiter: Secondary | ICD-10-CM

## 2022-05-28 DIAGNOSIS — I712 Thoracic aortic aneurysm, without rupture, unspecified: Secondary | ICD-10-CM | POA: Diagnosis not present

## 2022-05-28 DIAGNOSIS — E041 Nontoxic single thyroid nodule: Secondary | ICD-10-CM | POA: Diagnosis not present

## 2022-05-28 NOTE — Progress Notes (Signed)
[name] [address]             [phone]      Marie Johnson [id number] Oct 14, 1950   History of Present Illness:  Marie Johnson is a 71 y.o. female with a hx of CAD, diastolic heart failure, PAD, hypertension, hyperlipidemia, COPD, carotid artery disease, mild MR, mild to moderate aortic sclerosis, AAA, GERD, neuropathy, tobacco abuse, osteoarthritis, osteoporosis, and pre-diabetes. She was referred to cardiothoracic surgery by Dr. Kathlen Mody after she was found to have a 14 x 8 mm focal saccular aneurysm or penetrating atherosclerotic ulcer involving the left side of proximal portion of the transverse aortic arch and a 4.4 cm focal fusiform aneurysm on CTA done 04/20/2022 . An echo from 03/10/2020 shows a tricuspid aortic valve. Patient states her mother had an aneurysm that almost ruptured and was operated on, possibly TAA or AAA disease with dissection that was operated on. No connective tissue disorder to her knowledge. She states she knew about her descending aneurysm and it has been followed by her primary care. CT chest in 2022 shows 4.4 descending thoracic aneurysm. Patient complains of shortness of breath likely due to COPD that improves with her inhaler. She admits to weight gain and LE swelling for which she takes spironolactone. Patient denies chest pain, dizziness or syncope. She states that she was recently scratched by her dog and has a left lower extremity infection that is being followed by PCP.  Past Medical History:  Diagnosis Date   AAA (abdominal aortic aneurysm) (HCC)    3.4 cm   Anemia    Anxiety    Aortic atherosclerosis (HCC)    Arthritis    Asthma    CAD (coronary artery disease)    a. Nonobstructive CAD by cath 07/17/13.   Carotid artery disease ([place])    Carotid US 8/22: Bilateral ICA 1-39; right subclavian stenosis; right thyroid nodule   CHD (congenital heart disease)    pt unaware???   Chest pain    a. Adm  10-07/2013 - CTA neg for PE/dissection, cath with nonobstructive disease, CP ?due to uncontrolled HTN vs coronary vasospasm.   CHF (congestive heart failure) (HCC)    COPD (chronic obstructive pulmonary disease) (HCC)    Coronary artery disease    Echocardiogram    Echocardiogram 06/2019: EF 60-65, impaired relaxation (Gr 1 DD), mild MAC, mild MR, mild to mod aortic valve sclerosis (no AS), trivial TR, mild LAE, normal RVSF, RVSP 30.7 (mildly elevated)   GERD (gastroesophageal reflux disease)    Hyperlipidemia    Hypertension    Neuropathy    OA (osteoarthritis)    Osteoporosis    PAD (peripheral artery disease) (HCC)    a. s/p L fem-pop bypass b. recent evaluation at [facility], unamenable to intervention   PAD (peripheral artery disease) ([place])    Palpitations 02/22/2016   Pneumonia    Pre-diabetes    Premature ventricular contraction    PUD (peptic ulcer disease)    PVD (peripheral vascular disease) ([place]) 07/17/2013   Thoracic aortic aneurysm (HCC)    Chest/aorta CTA 04/2022: 14 x 8 focal saccular aneurysm versus penetrating atherosclerotic ulcer involving left side of proximal portion of transverse aortic arch; descending thoracic aorta and proximal abdominal aorta aneurysm 4.4 cm; aortic atherosclerosis; emphysema   Tobacco abuse    Wears dentures    Wears glasses     Current Outpatient Medications on File Prior to  Visit  Medication Sig Dispense Refill   albuterol (PROVENTIL HFA;VENTOLIN HFA) 108 (90 BASE) MCG/ACT inhaler Inhale 2 puffs into the lungs every 6 (six) hours as needed for wheezing.      albuterol (PROVENTIL) (2.5 MG/3ML) 0.083% nebulizer solution Take 2.5 mg by nebulization every 6 (six) hours as needed for wheezing or shortness of breath.     alendronate (FOSAMAX) 70 MG tablet Take 70 mg by mouth every Friday.      allopurinol (ZYLOPRIM) 300 MG tablet Take 300 mg by mouth daily.      aspirin EC 81 MG tablet Take 81 mg by mouth daily.      atorvastatin (LIPITOR) 80 MG  tablet Take 80 mg by mouth at bedtime.     cholecalciferol (VITAMIN D3) 25 MCG (1000 UNIT) tablet Take 5,000 Units by mouth daily.     dapagliflozin propanediol (FARXIGA) 10 MG TABS tablet Take 1 tablet (10 mg total) by mouth daily before breakfast. (Patient not taking: Reported on 01/26/2022) 30 tablet 3   diphenhydrAMINE (BENADRYL) 25 MG tablet Take 50 mg by mouth at bedtime.     EPIPEN 2-PAK 0.3 MG/0.3ML SOAJ injection Inject 0.3 mg into the muscle daily as needed. Use as directed as needed for allergies.     ezetimibe (ZETIA) 10 MG tablet Take 1 tablet (10 mg total) by mouth daily. 90 tablet 3   furosemide (LASIX) 40 MG tablet Take 1 tablet (40 mg total) by mouth daily. For 5 days only then repeat labs. 30 tablet 0   lactulose (CHRONULAC) 10 GM/15ML solution Take 20 g by mouth as needed for moderate constipation.      Menthol-Methyl Salicylate (MUSCLE RUB) 10-15 % CREA Apply 1 application topically daily as needed (for arthritis pain).     morphine (MSIR) 15 MG tablet Take 15 mg by mouth 6 (six) times daily. (Patient not taking: Reported on 01/26/2022)     nitroGLYCERIN (NITROSTAT) 0.4 MG SL tablet Place 1 tablet (0.4 mg total) under the tongue every 5 (five) minutes x 3 doses as needed for chest pain. 25 tablet 4   Oxycodone HCl 10 MG TABS Take 1 tablet (10 mg total) by mouth 4 (four) times daily as needed (for pain). 20 tablet 0   pantoprazole (PROTONIX) 40 MG tablet Take 40 mg by mouth 2 (two) times daily.     potassium chloride SA (KLOR-CON M20) 20 MEQ tablet Take 1 tablet (20 mEq total) by mouth daily. 90 tablet 3   senna-docusate (SENOKOT-S) 8.6-50 MG tablet Take 2 tablets by mouth at bedtime.     spironolactone (ALDACTONE) 25 MG tablet Take 1 tablet (25 mg total) by mouth daily. 90 tablet 3   verapamil (CALAN-SR) 240 MG CR tablet TAKE 1 TABLET BY MOUTH AT BEDTIME. 90 tablet 3   Current Facility-Administered Medications on File Prior to Visit  Medication Dose Route Frequency Provider Last  Rate Last Admin   DOBUTamine (DOBUTREX) 1,000 mcg/mL in dextrose 5% 250 mL infusion  30 mcg/kg/min Intravenous Titrated Dorothy Spark, MD       Vitals: Vitals:   05/28/22 1345  BP: (!) 147/53  Pulse: 90  Resp: (!) 22  SpO2: 94%     Review of Systems  Constitutional:  Positive for malaise/fatigue. Negative for chills and fever.  HENT:  Positive for congestion.   Eyes:  Negative for blurred vision and double vision.  Respiratory:  Positive for cough, sputum production, shortness of breath and wheezing.   Cardiovascular:  Positive for  leg swelling. Negative for chest pain and palpitations.  Gastrointestinal:  Positive for constipation and heartburn. Negative for nausea and vomiting.  Musculoskeletal:  Positive for joint pain.  Skin:  Negative for rash.  Neurological:  Positive for loss of consciousness and headaches. Negative for dizziness.  Endo/Heme/Allergies:  Bruises/bleeds easily.  Psychiatric/Behavioral:  Negative for depression. The patient is nervous/anxious.     Physical exam: General: alert, no distress Neuro: Grossly intact Neck: No carotid bruits CV: RRR, 1/6 systolic murmur, no rubs or gallops Pulm: Slightly diminished throughout, otherwise clear to auscultation bilaterally GI: Distension, nontender, normal BS Extremities: 2+ pedal pulses bilaterally, 1+ edema, LLE wrapped due to infection  CTA Results:  CLINICAL DATA:  Thoracic aortic aneurysm.   EXAM: CT ANGIOGRAPHY CHEST WITH CONTRAST   TECHNIQUE: Multidetector CT imaging of the chest was performed using the standard protocol during bolus administration of intravenous contrast. Multiplanar CT image reconstructions and MIPs were obtained to evaluate the vascular anatomy.   RADIATION DOSE REDUCTION: This exam was performed according to the departmental dose-optimization program which includes automated exposure control, adjustment of the mA and/or kV according to patient size and/or use of iterative  reconstruction technique.   CONTRAST:  70m ISOVUE-370 IOPAMIDOL (ISOVUE-370) INJECTION 76%   COMPARISON:  February 17, 2021.   FINDINGS: Cardiovascular: Atherosclerosis of thoracic aorta is noted without dissection. Great vessels are widely patent without significant stenosis. There is noted a 14 x 8 mm focal saccular aneurysm or penetrating atherosclerotic ulcer involving the left side of proximal portion of the transverse aortic arch. Also noted is 4.4 cm focal fusiform aneurysm involving the junction of the distal descending thoracic aorta and proximal abdominal aorta. Normal cardiac size. No pericardial effusion. Moderate coronary artery calcifications are noted.   Mediastinum/Nodes: No enlarged mediastinal, hilar, or axillary lymph nodes. Thyroid gland, trachea, and esophagus demonstrate no significant findings.   Lungs/Pleura: No pneumothorax or pleural effusion is noted. Mild emphysematous changes noted throughout both lungs. No acute consolidation is noted. Mild bronchial thickening is noted in bilateral lower lobe bronchi suggesting bronchitis.   Upper Abdomen: No acute abnormality.   Musculoskeletal: No chest wall abnormality. No acute or significant osseous findings.   Review of the MIP images confirms the above findings.   IMPRESSION: Grossly stable 14 x 8 mm focal saccular aneurysm or penetrating atherosclerotic ulcer is seen involving the left side of the proximal portion of the transverse aortic arch.   Also noted is stable 4.4 cm focal fusiform aneurysm involving the distal descending thoracic aorta and proximal abdominal aorta. Recommend annual imaging followup by CTA or MRA. This recommendation follows 2010 ACCF/AHA/AATS/ACR/ASA/SCA/SCAI/SIR/STS/SVM Guidelines for the Diagnosis and Management of Patients with Thoracic Aortic Disease. Circulation. 2010; 121:: L381-O175 Aortic aneurysm NOS (ICD10-I71.9).   Mild bronchial wall thickening is noted in bilateral  lower lobe bronchi concerning for bronchitis.   Aortic Atherosclerosis (ICD10-I70.0) and Emphysema (ICD10-J43.9).     Electronically Signed   By: JMarijo ConceptionM.D.   On: 04/20/2022 11:37   A/P:  Thoracic aortic aneurysm: On CTA from 04/20/2022 there is a grossly stable 14 x 8 mm focal saccular aneurysm or penetrating atherosclerotic ulcer seen involving the left side of the proximal portion of the transverse aortic arch there is also noted to be a stable 4.4 cm focal fusiform aneurysm involving the distal descending thoracic aorta and proximal abdominal aorta. She has no chest pain, dizziness or syncope. She has a family history of TAA or AAA disease. Will begin annual surveillance,  plan for follow up with CTA in 1 year. Will also refer to vascular for descending aneurysm.  COPD: She has shortness of breath and wheezing that seems to be due to COPD, states it improves with inhaler. Continue inhaler and follow up with primary care.  Prediabetes: discussed importance of healthy diet and exercise. Followed closely by PCP.  LLE infection: Continue to follow up with PCP  Risk Modification in those with ascending thoracic aortic aneurysm:   Continue good control of blood pressure (prefer SBP 130/80 or less) - on Verapamil 240 CR mg daily 147/53 today. Despite Medical intervention if the patient notices persistently elevated blood pressure readings. They are instructed to contact their Primary Care Physician   2. Avoid fluoroquinolone antibiotics (I.e Ciprofloxacin, Avelox, Levofloxacin, Ofloxacin) as this can potentially increase your risk of Aortic Rupture and/or Dissection   3.  Use of statin (to decrease cardiovascular risk) - on Zetia 10 mg daily and Atorvastatin 80 mg daily for hyperlipidemia. Followed closely by PCP.   4.  Exercise and activity limitations is individualized, but in general, contact sports are to be avoided and one should avoid heavy lifting (defined as half of ideal  body weight) and exercises involving sustained Valsalva maneuver.   5. Counseling for those suspected of having genetically mediated disease. First-degree relatives of those with TAA disease should be screened as well as those who have a connective tissue disease (I.e with Marfan syndrome, Ehlers-Danlos syndrome, and Loeys-Dietz syndrome) or a  bicuspid aortic valve, have an increased risk for complications related to TAA.   6. Tobacco abuse: smoking cessation was advised, counseled the patient on the dangers of tobacco abuse, advised to quit smoking and reviewed strategies to maximize success.  7. Patient educated on signs and symptoms of Aortic Dissection, handout also provided in AVS  Magdalene River, PA-C 05/28/22

## 2022-05-28 NOTE — Patient Instructions (Addendum)
Risk Modification in those with ascending thoracic aortic aneurysm:   Continue good control of blood pressure (prefer SBP 130/80 or less). Despite medical intervention if the patient notices persistently elevated blood pressure readings.  They are instructed to contact their Primary Care Physician   2. Avoid fluoroquinolone antibiotics (I.e Ciprofloxacin, Avelox, Levofloxacin, Ofloxacin) as this can potentially increase your risk of Aortic Rupture and/or Dissection   3.  Use of statin (to decrease cardiovascular risk) - on Zetia 10 mg daily and Atorvastatin 80 mg daily   4.  Exercise and activity limitations is individualized, but in general, contact sports are to be avoided and one should avoid heavy lifting (defined as half of ideal body weight) and exercises involving sustained Valsalva maneuver.   5. Counseling for those suspected of having genetically mediated disease. First-degree relatives of those with TAA disease should be screened as well as those who have a connective tissue disease (I.e with Marfan syndrome, Ehlers-Danlos syndrome,  and Loeys-Dietz syndrome) or a  bicuspid aortic valve, have an increased risk for complications related to TAA.   6. Smoking cessation was advised, counseled the patient on the dangers of tobacco abuse, advised to quit smoking and reviewed strategies to maximize success.

## 2022-05-29 ENCOUNTER — Encounter: Payer: Self-pay | Admitting: Vascular Surgery

## 2022-05-29 ENCOUNTER — Ambulatory Visit: Payer: Medicare Other | Admitting: Vascular Surgery

## 2022-05-29 VITALS — BP 136/69 | HR 86 | Temp 97.9°F | Resp 16 | Ht 68.0 in | Wt 129.0 lb

## 2022-05-29 DIAGNOSIS — R030 Elevated blood-pressure reading, without diagnosis of hypertension: Secondary | ICD-10-CM | POA: Diagnosis not present

## 2022-05-29 DIAGNOSIS — M25551 Pain in right hip: Secondary | ICD-10-CM | POA: Diagnosis not present

## 2022-05-29 DIAGNOSIS — I712 Thoracic aortic aneurysm, without rupture, unspecified: Secondary | ICD-10-CM

## 2022-05-29 DIAGNOSIS — Z79899 Other long term (current) drug therapy: Secondary | ICD-10-CM | POA: Diagnosis not present

## 2022-05-29 DIAGNOSIS — G8929 Other chronic pain: Secondary | ICD-10-CM | POA: Diagnosis not present

## 2022-05-29 DIAGNOSIS — M25552 Pain in left hip: Secondary | ICD-10-CM | POA: Diagnosis not present

## 2022-05-29 DIAGNOSIS — Z79891 Long term (current) use of opiate analgesic: Secondary | ICD-10-CM | POA: Diagnosis not present

## 2022-05-29 DIAGNOSIS — Z6820 Body mass index (BMI) 20.0-20.9, adult: Secondary | ICD-10-CM | POA: Diagnosis not present

## 2022-05-29 DIAGNOSIS — Z681 Body mass index (BMI) 19 or less, adult: Secondary | ICD-10-CM | POA: Diagnosis not present

## 2022-05-29 DIAGNOSIS — I739 Peripheral vascular disease, unspecified: Secondary | ICD-10-CM | POA: Diagnosis not present

## 2022-05-29 NOTE — Progress Notes (Signed)
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  Not having any new symptoms.  No smoking.  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  They plan follow-up CT in a year.  Patient states she was told to see me about the descending thoracic aneurysm.  She has somewhat complex history.  She initially had a left common femoral to above-knee popliteal bypass by Dr. Donnetta Hutching about 20 years ago.  I initially saw her with bilateral lower extremity critical limb ischemia.  On 03/16/2020 she underwent left common and external iliac artery angioplasty with stent placement with a patent bypass that Dr. Donnetta Hutching had previously performed.  At a later date on 03/18/2020, I performed right common femoral endarterectomy with retrograde right common iliac artery stenting and then a right common femoral to below knee pop bypass with PTFE.    Past Medical History:  Diagnosis Date   AAA (abdominal aortic aneurysm) (HCC)    3.4 cm   Anemia    Anxiety    Aortic atherosclerosis (HCC)    Arthritis    Asthma    CAD (coronary artery disease)    a. Nonobstructive CAD by cath 07/17/13.   Carotid artery disease ([place])    Carotid US 8/22: Bilateral ICA 1-39; right subclavian stenosis; right thyroid nodule   CHD (congenital heart disease)    pt unaware???   Chest pain    a. Adm 10-07/2013 - CTA neg for PE/dissection, cath with nonobstructive disease, CP ?due to uncontrolled HTN vs coronary vasospasm.   CHF (congestive heart failure) (HCC)    COPD (chronic obstructive  pulmonary disease) (HCC)    Coronary artery disease    Echocardiogram    Echocardiogram 06/2019: EF 60-65, impaired relaxation (Gr 1 DD), mild MAC, mild MR, mild to mod aortic valve sclerosis (no AS), trivial TR, mild LAE, normal RVSF, RVSP 30.7 (mildly elevated)   GERD (gastroesophageal reflux disease)    Hyperlipidemia    Hypertension    Neuropathy    OA (osteoarthritis)    Osteoporosis    PAD (peripheral artery disease) (HCC)    a. s/p L fem-pop bypass b. recent evaluation at [facility], unamenable to intervention   PAD (peripheral artery disease) ([place])    Palpitations 02/22/2016   Pneumonia    Pre-diabetes    Premature ventricular contraction    PUD (peptic ulcer disease)    PVD (peripheral vascular disease) ([place]) 07/17/2013   Thoracic aortic aneurysm (HCC)    Chest/aorta CTA 04/2022: 14 x 8 focal saccular aneurysm versus penetrating atherosclerotic ulcer involving left side of proximal portion of transverse aortic arch; descending thoracic aorta and proximal abdominal aorta aneurysm 4.4 cm; aortic atherosclerosis; emphysema   Tobacco abuse    Wears dentures    Wears glasses     Past Surgical History:  Procedure Laterality Date   ABDOMINAL AORTOGRAM W/LOWER EXTREMITY N/A 03/09/2020   Procedure: ABDOMINAL AORTOGRAM W/LOWER EXTREMITY;  Surgeon: [name], MD;  Location: [place] [facility];  Service: Cardiovascular;  Laterality: N/A;  ABDOMINAL HYSTERECTOMY     BREAST SURGERY     left cyst excision x 2   CARDIAC CATHETERIZATION     several years ago, nonobstructive, "50-60% blockages"   ENDARTERECTOMY FEMORAL Right 03/18/2020   Procedure: ENDARTERECTOMY FEMORAL WITH PROFUNDAPLASTY;  Surgeon: [name], MD;  Location: [place];  Service: Vascular;  Laterality: Right;   FEMORAL-POPLITEAL BYPASS GRAFT Right 03/18/2020   Procedure: BYPASS GRAFT FEMORAL-POPLITEAL ARTERY using GORE PROPATEN VASCULAR GRAFT;  Surgeon: [name], MD;  Location: [place];  Service:  Vascular;  Laterality: Right;   HIP SURGERY     left - bars and screws placed   [place] Right 03/18/2020   Procedure: INSERTION OF GORE VIABAHN  ILIAC STENT;  Surgeon: [name], MD;  Location: [place];  Service: Vascular;  Laterality: Right;   [place] N/A 07/17/2013   Procedure: LEFT HEART CATHETERIZATION WITH CORONARY ANGIOGRAM;  Surgeon: Larey Dresser, MD;  Location: [facility] CATH LAB;  Service: Cardiovascular;  Laterality: N/A;   LOWER EXTREMITY ANGIOGRAM Right 03/18/2020   Procedure: RIGHT ILIAC ARTERIOGRAM WITH AN ILIAC STENT;  Surgeon: [name], MD;  Location: [place];  Service: Vascular;  Laterality: Right;   MULTIPLE TOOTH EXTRACTIONS     PATCH ANGIOPLASTY Right 03/18/2020   Procedure: PATCH ANGIOPLASTY using a [place] OF THE PROFUNDA;  Surgeon: [name], MD;  Location: [place];  Service: Vascular;  Laterality: Right;   PERIPHERAL VASCULAR CATHETERIZATION N/A 07/16/2016   Procedure: Abdominal Aortogram w/Lower Extremity;  Surgeon: Rosetta Posner, MD;  Location: [place] [facility];  Service: Cardiovascular;  Laterality: N/A;   PERIPHERAL VASCULAR INTERVENTION Left 03/09/2020   Procedure: PERIPHERAL VASCULAR INTERVENTION;  Surgeon: [name], MD;  Location: [place] [facility];  Service: Cardiovascular;  Laterality: Left;  common and external illiac    ROTATOR CUFF REPAIR     right side, had torn ligaments as well   TUBAL LIGATION     VEIN BYPASS SURGERY     left leg   WRIST ARTHROCENTESIS     left - broke in 3 places so has bars and screws placed    Family History  Problem Relation Age of Onset   Cervical cancer Mother    Heart disease Mother        MI in 41s   Liver cancer Father    Heart disease Father    Lung cancer Maternal Aunt    Prostate cancer Maternal Uncle        met. anal   Breast cancer Maternal Grandmother    Lung cancer Maternal Aunt    Lung  cancer Maternal Aunt    Stomach cancer Maternal Aunt    Colon cancer Maternal Aunt    Lung cancer Maternal Uncle     SOCIAL HISTORY: Social History   Tobacco Use   Smoking status: Every Day    Packs/day: 0.25    Years: 50.00    Total pack years: 12.50    Types: Cigarettes   Smokeless tobacco: Never   Tobacco comments:    1-2 cigarettes Daily  Substance Use Topics   Alcohol use: Never    Allergies  Allergen Reactions   Bee Venom Anaphylaxis   Bee Venom Anaphylaxis and Other (See Comments)    Welts, also   Insect Extract Anaphylaxis and Other (See Comments)    Insect bites- Welts, also   Nsaids  Nausea Only and Other (See Comments)    Patient is not suppose to have this class of medication (liver)   Latex Rash   Latex Rash    Current Outpatient Medications  Medication Sig Dispense Refill   albuterol (PROVENTIL HFA;VENTOLIN HFA) 108 (90 BASE) MCG/ACT inhaler Inhale 2 puffs into the lungs every 6 (six) hours as needed for wheezing.      albuterol (PROVENTIL) (2.5 MG/3ML) 0.083% nebulizer solution Take 2.5 mg by nebulization every 6 (six) hours as needed for wheezing or shortness of breath.     alendronate (FOSAMAX) 70 MG tablet Take 70 mg by mouth every Friday.      allopurinol (ZYLOPRIM) 300 MG tablet Take 300 mg by mouth daily.      aspirin EC 81 MG tablet Take 81 mg by mouth daily.      atorvastatin (LIPITOR) 80 MG tablet Take 80 mg by mouth at bedtime.     cholecalciferol (VITAMIN D3) 25 MCG (1000 UNIT) tablet Take 5,000 Units by mouth daily.     diphenhydrAMINE (BENADRYL) 25 MG tablet Take 50 mg by mouth at bedtime.     EPIPEN 2-PAK 0.3 MG/0.3ML SOAJ injection Inject 0.3 mg into the muscle daily as needed. Use as directed as needed for allergies.     ezetimibe (ZETIA) 10 MG tablet Take 1 tablet (10 mg total) by mouth daily. 90 tablet 3   furosemide (LASIX) 40 MG tablet Take 1 tablet (40 mg total) by mouth daily. For 5 days only then repeat labs. 30 tablet 0   lactulose  (CHRONULAC) 10 GM/15ML solution Take 20 g by mouth as needed for moderate constipation.      Menthol-Methyl Salicylate (MUSCLE RUB) 10-15 % CREA Apply 1 application topically daily as needed (for arthritis pain).     nitroGLYCERIN (NITROSTAT) 0.4 MG SL tablet Place 1 tablet (0.4 mg total) under the tongue every 5 (five) minutes x 3 doses as needed for chest pain. 25 tablet 4   Oxycodone HCl 10 MG TABS Take 1 tablet (10 mg total) by mouth 4 (four) times daily as needed (for pain). 20 tablet 0   pantoprazole (PROTONIX) 40 MG tablet Take 40 mg by mouth 2 (two) times daily.     potassium chloride SA (KLOR-CON M20) 20 MEQ tablet Take 1 tablet (20 mEq total) by mouth daily. 90 tablet 3   senna-docusate (SENOKOT-S) 8.6-50 MG tablet Take 2 tablets by mouth at bedtime.     spironolactone (ALDACTONE) 25 MG tablet Take 1 tablet (25 mg total) by mouth daily. 90 tablet 3   verapamil (CALAN-SR) 240 MG CR tablet TAKE 1 TABLET BY MOUTH AT BEDTIME. 90 tablet 3   No current facility-administered medications for this visit.   Facility-Administered Medications Ordered in Other Visits  Medication Dose Route Frequency Provider Last Rate Last Admin   DOBUTamine (DOBUTREX) 1,000 mcg/mL in dextrose 5% 250 mL infusion  30 mcg/kg/min Intravenous Titrated Dorothy Spark, MD        REVIEW OF SYSTEMS:  _0  denotes positive finding, _1  denotes negative finding Cardiac  Comments:  Chest pain or chest pressure:    Shortness of breath upon exertion:    Short of breath when lying flat:    Irregular heart rhythm:        Vascular    Pain in calf, thigh, or hip brought on by ambulation:    Pain in feet at night that wakes you up from your sleep:     Blood clot  in your veins:    Leg swelling:         Pulmonary    Oxygen at home:    Productive cough:     Wheezing:         Neurologic    Sudden weakness in arms or legs:     Sudden numbness in arms or legs:     Sudden onset of difficulty speaking or slurred speech:     Temporary loss of vision in one eye:     Problems with dizziness:         Gastrointestinal    Blood in stool:     Vomited blood:         Genitourinary    Burning when urinating:     Blood in urine:        Psychiatric    Major depression:         Hematologic    Bleeding problems:    Problems with blood clotting too easily:        Skin    Rashes or ulcers:        Constitutional    Fever or chills:      PHYSICAL EXAM: Vitals:   05/29/22 0953  BP: 136/69  Pulse: 86  Resp: 16  Temp: 97.9 F (36.6 C)  TempSrc: Temporal  SpO2: 91%  Weight: 129 lb (58.5 kg)  Height: _0  (1.727 m)    GENERAL: The patient is a well-nourished female, in no acute distress. The vital signs are documented above. CARDIAC: There is a regular rate and rhythm.  VASCULAR:  Palpable femoral pulses bilaterally Right DP 1+ palpable Left pedal pulses nonpalpable but brisk DP doppler signal with unna dressing in place PULMONARY: No respiratory distress. ABDOMEN: Soft and non-tender. MUSCULOSKELETAL: There are no major deformities or cyanosis. NEUROLOGIC: No focal weakness or paresthesias are detected. SKIN: There are no ulcers or rashes noted.   DATA:   CTA chest reviewed from 04/20/2022 with a stable 4.4 cm aneurysm in the distal descending thoracic aorta stable over the past year.  Assessment/Plan:   71 y.o. 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  She was seen by CT surgery yesterday for the aortic arch aneurysm.  Patient states she was told to see me for the descending thoracic aneurysm.  Discussed I do not see any interval change over the last year.  Indication for surgical intervention.  Discussed descending  thoracic aneurysms are typically repaired at 5.5 to 6 cm.  I agree with follow-up in 1 year for this and can look at the CT scan ordered by CT surgery.  She is due for surveillance of her bilateral lower extremity bypass grafts as well as iliac stents in November 2023.  I will arrange this with the office today.  I again discussed the importance of smoking cessation.    [name], MD [facility] Office: [phone]

## 2022-05-31 DIAGNOSIS — Z79899 Other long term (current) drug therapy: Secondary | ICD-10-CM | POA: Diagnosis not present

## 2022-05-31 DIAGNOSIS — L97921 Non-pressure chronic ulcer of unspecified part of left lower leg limited to breakdown of skin: Secondary | ICD-10-CM | POA: Diagnosis not present

## 2022-06-07 DIAGNOSIS — L97921 Non-pressure chronic ulcer of unspecified part of left lower leg limited to breakdown of skin: Secondary | ICD-10-CM | POA: Diagnosis not present

## 2022-06-11 DIAGNOSIS — J384 Edema of larynx: Secondary | ICD-10-CM | POA: Diagnosis not present

## 2022-06-11 DIAGNOSIS — J383 Other diseases of vocal cords: Secondary | ICD-10-CM | POA: Diagnosis not present

## 2022-06-11 DIAGNOSIS — R490 Dysphonia: Secondary | ICD-10-CM | POA: Diagnosis not present

## 2022-06-14 DIAGNOSIS — R609 Edema, unspecified: Secondary | ICD-10-CM | POA: Diagnosis not present

## 2022-06-14 DIAGNOSIS — L97921 Non-pressure chronic ulcer of unspecified part of left lower leg limited to breakdown of skin: Secondary | ICD-10-CM | POA: Diagnosis not present

## 2022-06-14 DIAGNOSIS — Z681 Body mass index (BMI) 19 or less, adult: Secondary | ICD-10-CM | POA: Diagnosis not present

## 2022-06-28 DIAGNOSIS — M25551 Pain in right hip: Secondary | ICD-10-CM | POA: Diagnosis not present

## 2022-06-28 DIAGNOSIS — Z681 Body mass index (BMI) 19 or less, adult: Secondary | ICD-10-CM | POA: Diagnosis not present

## 2022-06-28 DIAGNOSIS — R030 Elevated blood-pressure reading, without diagnosis of hypertension: Secondary | ICD-10-CM | POA: Diagnosis not present

## 2022-06-28 DIAGNOSIS — M25552 Pain in left hip: Secondary | ICD-10-CM | POA: Diagnosis not present

## 2022-06-28 DIAGNOSIS — I739 Peripheral vascular disease, unspecified: Secondary | ICD-10-CM | POA: Diagnosis not present

## 2022-06-28 DIAGNOSIS — Z79891 Long term (current) use of opiate analgesic: Secondary | ICD-10-CM | POA: Diagnosis not present

## 2022-06-28 DIAGNOSIS — Z6820 Body mass index (BMI) 20.0-20.9, adult: Secondary | ICD-10-CM | POA: Diagnosis not present

## 2022-06-28 DIAGNOSIS — Z79899 Other long term (current) drug therapy: Secondary | ICD-10-CM | POA: Diagnosis not present

## 2022-07-01 ENCOUNTER — Other Ambulatory Visit: Payer: Self-pay | Admitting: Physician Assistant

## 2022-07-01 DIAGNOSIS — E7849 Other hyperlipidemia: Secondary | ICD-10-CM

## 2022-07-01 DIAGNOSIS — I739 Peripheral vascular disease, unspecified: Secondary | ICD-10-CM

## 2022-07-01 DIAGNOSIS — I2510 Atherosclerotic heart disease of native coronary artery without angina pectoris: Secondary | ICD-10-CM

## 2022-07-01 DIAGNOSIS — I10 Essential (primary) hypertension: Secondary | ICD-10-CM

## 2022-07-01 DIAGNOSIS — I493 Ventricular premature depolarization: Secondary | ICD-10-CM

## 2022-07-01 DIAGNOSIS — M791 Myalgia, unspecified site: Secondary | ICD-10-CM

## 2022-07-05 ENCOUNTER — Other Ambulatory Visit: Payer: Self-pay | Admitting: *Deleted

## 2022-07-05 DIAGNOSIS — I739 Peripheral vascular disease, unspecified: Secondary | ICD-10-CM

## 2022-07-24 ENCOUNTER — Other Ambulatory Visit: Payer: Self-pay | Admitting: Vascular Surgery

## 2022-07-24 ENCOUNTER — Ambulatory Visit (INDEPENDENT_AMBULATORY_CARE_PROVIDER_SITE_OTHER)
Admission: RE | Admit: 2022-07-24 | Discharge: 2022-07-24 | Disposition: A | Discharge status: Home / Self Care | Payer: Medicare Other | Source: Ambulatory Visit | Attending: Vascular Surgery | Admitting: Vascular Surgery

## 2022-07-24 ENCOUNTER — Other Ambulatory Visit: Payer: Self-pay

## 2022-07-24 ENCOUNTER — Ambulatory Visit: Payer: Medicare Other | Admitting: Vascular Surgery

## 2022-07-24 ENCOUNTER — Ambulatory Visit (HOSPITAL_COMMUNITY)
Admission: RE | Admit: 2022-07-24 | Discharge: 2022-07-24 | Disposition: A | Discharge status: Home / Self Care | Payer: Medicare Other | Source: Ambulatory Visit | Attending: Vascular Surgery | Admitting: Vascular Surgery

## 2022-07-24 ENCOUNTER — Encounter: Payer: Self-pay | Admitting: Vascular Surgery

## 2022-07-24 VITALS — BP 172/79 | HR 87 | Temp 98.2°F | Resp 20 | Ht 68.0 in | Wt 126.0 lb

## 2022-07-24 DIAGNOSIS — I70229 Atherosclerosis of native arteries of extremities with rest pain, unspecified extremity: Secondary | ICD-10-CM

## 2022-07-24 DIAGNOSIS — I739 Peripheral vascular disease, unspecified: Secondary | ICD-10-CM

## 2022-07-24 DIAGNOSIS — Z9889 Other specified postprocedural states: Secondary | ICD-10-CM | POA: Diagnosis not present

## 2022-07-24 NOTE — Progress Notes (Signed)
Patient name: Marie Johnson MRN: [id number] DOB: 04/17/1951 Sex: female  REASON FOR VISIT: 1 year follow-up for for surveillance of bilateral lower extremity bypass grafts  HPI: Marie Johnson is a 71 y.o. female with multiple comorbidities as listed below including CAD, COPD, HTN, HLD, PAD and tobacco abuse the presents for 1 year follow-up and surveillance of her bilateral lower extremity bypass grafts.  She reports having more pain in her left leg over the last several months now worse over the past few weeks.  She also had a wound from a dog bite on her left shin.  Still smoking 6 cigarettes a day.  She has somewhat complex history.  She initially had a left common femoral to above-knee popliteal bypass by Dr. Donnetta Hutching about 20 years ago.  I initially saw her with bilateral lower extremity critical limb ischemia.  On 03/16/2020 she underwent left common and external iliac artery angioplasty with stent placement with a patent bypass that Dr. Donnetta Hutching had previously performed.  At a later date on 03/18/2020, I performed right common femoral endarterectomy with retrograde right common iliac artery stenting and then a right common femoral to below knee pop bypass with PTFE.  Also seen for descending thoracic aneurysm. 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  Past Medical History:  Diagnosis Date   AAA (abdominal aortic aneurysm) (HCC)    3.4 cm   Anemia    Anxiety    Aortic atherosclerosis (HCC)    Arthritis    Asthma    CAD (coronary artery disease)    a. Nonobstructive CAD by cath 07/17/13.   Carotid artery disease ([place])    Carotid US 8/22: Bilateral ICA 1-39; right subclavian stenosis; right thyroid nodule   CHD (congenital heart  disease)    pt unaware???   Chest pain    a. Adm 10-07/2013 - CTA neg for PE/dissection, cath with nonobstructive disease, CP ?due to uncontrolled HTN vs coronary vasospasm.   CHF (congestive heart failure) (HCC)    COPD (chronic obstructive pulmonary disease) (HCC)    Coronary artery disease    Echocardiogram    Echocardiogram 06/2019: EF 60-65, impaired relaxation (Gr 1 DD), mild MAC, mild MR, mild to mod aortic valve sclerosis (no AS), trivial TR, mild LAE, normal RVSF, RVSP 30.7 (mildly elevated)   GERD (gastroesophageal reflux disease)    Hyperlipidemia    Hypertension    Neuropathy    OA (osteoarthritis)    Osteoporosis    PAD (peripheral artery disease) (HCC)    a. s/p L fem-pop bypass b. recent evaluation at [facility], unamenable to intervention   PAD (peripheral artery disease) ([place])    Palpitations 02/22/2016   Pneumonia    Pre-diabetes    Premature ventricular contraction    PUD (peptic ulcer disease)    PVD (peripheral vascular disease) ([place]) 07/17/2013   Thoracic aortic aneurysm (HCC)    Chest/aorta CTA 04/2022: 14 x 8 focal saccular aneurysm versus penetrating atherosclerotic ulcer involving left side of proximal portion of transverse aortic arch; descending thoracic aorta and proximal abdominal aorta aneurysm 4.4 cm; aortic atherosclerosis; emphysema   Tobacco abuse    Wears dentures    Wears glasses  Past Surgical History:  Procedure Laterality Date   ABDOMINAL AORTOGRAM W/LOWER EXTREMITY N/A 03/09/2020   Procedure: ABDOMINAL AORTOGRAM W/LOWER EXTREMITY;  Surgeon: [name], MD;  Location: [place] [facility];  Service: Cardiovascular;  Laterality: N/A;   ABDOMINAL HYSTERECTOMY     BREAST SURGERY     left cyst excision x 2   CARDIAC CATHETERIZATION     several years ago, nonobstructive, "50-60% blockages"   ENDARTERECTOMY FEMORAL Right 03/18/2020   Procedure: ENDARTERECTOMY FEMORAL WITH PROFUNDAPLASTY;  Surgeon: [name], MD;  Location: [place];   Service: Vascular;  Laterality: Right;   FEMORAL-POPLITEAL BYPASS GRAFT Right 03/18/2020   Procedure: BYPASS GRAFT FEMORAL-POPLITEAL ARTERY using GORE PROPATEN VASCULAR GRAFT;  Surgeon: [name], MD;  Location: [place];  Service: Vascular;  Laterality: Right;   HIP SURGERY     left - bars and screws placed   [place] Right 03/18/2020   Procedure: INSERTION OF GORE VIABAHN  ILIAC STENT;  Surgeon: [name], MD;  Location: [place];  Service: Vascular;  Laterality: Right;   [place] N/A 07/17/2013   Procedure: LEFT HEART CATHETERIZATION WITH CORONARY ANGIOGRAM;  Surgeon: Larey Dresser, MD;  Location: [facility] CATH LAB;  Service: Cardiovascular;  Laterality: N/A;   LOWER EXTREMITY ANGIOGRAM Right 03/18/2020   Procedure: RIGHT ILIAC ARTERIOGRAM WITH AN ILIAC STENT;  Surgeon: [name], MD;  Location: [place];  Service: Vascular;  Laterality: Right;   MULTIPLE TOOTH EXTRACTIONS     PATCH ANGIOPLASTY Right 03/18/2020   Procedure: PATCH ANGIOPLASTY using a [place] OF THE PROFUNDA;  Surgeon: [name], MD;  Location: [place];  Service: Vascular;  Laterality: Right;   PERIPHERAL VASCULAR CATHETERIZATION N/A 07/16/2016   Procedure: Abdominal Aortogram w/Lower Extremity;  Surgeon: Rosetta Posner, MD;  Location: [place] [facility];  Service: Cardiovascular;  Laterality: N/A;   PERIPHERAL VASCULAR INTERVENTION Left 03/09/2020   Procedure: PERIPHERAL VASCULAR INTERVENTION;  Surgeon: [name], MD;  Location: [place] [facility];  Service: Cardiovascular;  Laterality: Left;  common and external illiac    ROTATOR CUFF REPAIR     right side, had torn ligaments as well   TUBAL LIGATION     VEIN BYPASS SURGERY     left leg   WRIST ARTHROCENTESIS     left - broke in 3 places so has bars and screws placed    Family History  Problem Relation Age of Onset   Cervical cancer Mother    Heart  disease Mother        MI in 15s   Liver cancer Father    Heart disease Father    Lung cancer Maternal Aunt    Prostate cancer Maternal Uncle        met. anal   Breast cancer Maternal Grandmother    Lung cancer Maternal Aunt    Lung cancer Maternal Aunt    Stomach cancer Maternal Aunt    Colon cancer Maternal Aunt    Lung cancer Maternal Uncle     SOCIAL HISTORY: Social History   Tobacco Use   Smoking status: Every Day    Years: 50.00    Types: Cigarettes   Smokeless tobacco: Never  Substance Use Topics   Alcohol use: Never    Allergies  Allergen Reactions   Bee Venom Anaphylaxis   Bee Venom Anaphylaxis and Other (See Comments)    Welts,  also   Insect Extract Anaphylaxis and Other (See Comments)    Insect bites- Welts, also   Nsaids Nausea Only and Other (See Comments)    Patient is not suppose to have this class of medication (liver)   Latex Rash   Latex Rash    Current Outpatient Medications  Medication Sig Dispense Refill   albuterol (PROVENTIL HFA;VENTOLIN HFA) 108 (90 BASE) MCG/ACT inhaler Inhale 2 puffs into the lungs every 6 (six) hours as needed for wheezing.      albuterol (PROVENTIL) (2.5 MG/3ML) 0.083% nebulizer solution Take 2.5 mg by nebulization every 6 (six) hours as needed for wheezing or shortness of breath.     alendronate (FOSAMAX) 70 MG tablet Take 70 mg by mouth every Friday.      allopurinol (ZYLOPRIM) 300 MG tablet Take 300 mg by mouth daily.      aspirin EC 81 MG tablet Take 81 mg by mouth daily.      atorvastatin (LIPITOR) 80 MG tablet Take 80 mg by mouth at bedtime.     cholecalciferol (VITAMIN D3) 25 MCG (1000 UNIT) tablet Take 5,000 Units by mouth daily.     diphenhydrAMINE (BENADRYL) 25 MG tablet Take 50 mg by mouth at bedtime.     EPIPEN 2-PAK 0.3 MG/0.3ML SOAJ injection Inject 0.3 mg into the muscle daily as needed. Use as directed as needed for allergies.     ezetimibe (ZETIA) 10 MG tablet TAKE 1 TABLET BY MOUTH EVERY DAY 90 tablet 2    furosemide (LASIX) 40 MG tablet Take 1 tablet (40 mg total) by mouth daily. For 5 days only then repeat labs. 30 tablet 0   lactulose (CHRONULAC) 10 GM/15ML solution Take 20 g by mouth as needed for moderate constipation.      Menthol-Methyl Salicylate (MUSCLE RUB) 10-15 % CREA Apply 1 application topically daily as needed (for arthritis pain).     nitroGLYCERIN (NITROSTAT) 0.4 MG SL tablet Place 1 tablet (0.4 mg total) under the tongue every 5 (five) minutes x 3 doses as needed for chest pain. 25 tablet 4   Oxycodone HCl 10 MG TABS Take 1 tablet (10 mg total) by mouth 4 (four) times daily as needed (for pain). 20 tablet 0   pantoprazole (PROTONIX) 40 MG tablet Take 40 mg by mouth 2 (two) times daily.     potassium chloride SA (KLOR-CON M20) 20 MEQ tablet Take 1 tablet (20 mEq total) by mouth daily. 90 tablet 3   senna-docusate (SENOKOT-S) 8.6-50 MG tablet Take 2 tablets by mouth at bedtime.     spironolactone (ALDACTONE) 25 MG tablet Take 1 tablet (25 mg total) by mouth daily. 90 tablet 3   verapamil (CALAN-SR) 240 MG CR tablet TAKE 1 TABLET BY MOUTH EVERYDAY AT BEDTIME 90 tablet 2   No current facility-administered medications for this visit.   Facility-Administered Medications Ordered in Other Visits  Medication Dose Route Frequency Provider Last Rate Last Admin   DOBUTamine (DOBUTREX) 1,000 mcg/mL in dextrose 5% 250 mL infusion  30 mcg/kg/min Intravenous Titrated Dorothy Spark, MD        REVIEW OF SYSTEMS:  _0  denotes positive finding, _1  denotes negative finding Cardiac  Comments:  Chest pain or chest pressure:    Shortness of breath upon exertion:    Short of breath when lying flat:    Irregular heart rhythm:        Vascular    Pain in calf, thigh, or hip brought on by ambulation:  Pain in feet at night that wakes you up from your sleep:     Blood clot in your veins:    Leg swelling:         Pulmonary    Oxygen at home:    Productive cough:     Wheezing:          Neurologic    Sudden weakness in arms or legs:     Sudden numbness in arms or legs:     Sudden onset of difficulty speaking or slurred speech:    Temporary loss of vision in one eye:     Problems with dizziness:         Gastrointestinal    Blood in stool:     Vomited blood:         Genitourinary    Burning when urinating:     Blood in urine:        Psychiatric    Major depression:         Hematologic    Bleeding problems:    Problems with blood clotting too easily:        Skin    Rashes or ulcers:        Constitutional    Fever or chills:      PHYSICAL EXAM: Vitals:   07/24/22 0920  BP: (!) 172/79  Pulse: 87  Resp: 20  Temp: 98.2 F (36.8 C)  SpO2: 93%  Weight: 126 lb (57.2 kg)  Height: _0  (1.727 m)    GENERAL: The patient is a well-nourished female, in no acute distress. The vital signs are documented above. CARDIAC: There is a regular rate and rhythm.  VASCULAR:  Palpable femoral pulses bilaterally Brisk triphasic right PT signal No palpable left pedal pulses, monophasic PT/DP signal Left shin wound as pictured PULMONARY: No respiratory distress. ABDOMEN: Soft and non-tender. MUSCULOSKELETAL: There are no major deformities or cyanosis. NEUROLOGIC: No focal weakness or paresthesias are detected.     DATA:   Bilateral iliac stents are patent with no stenosis.  Left leg bypass appears occluded.  Right leg bypass patent with no stenosis  ABIs 0.96 on the right triphasic and 0.4 on the left dampened monophasic  Assessment/Plan:  71 y.o. female presents for surveillance of her PAD.  Her bilateral iliac stents appear patent with no identified stenosis.  Her right leg bypass remains patent with no stenosis.  On the left her bypass is occluded.  I am unclear about the acuity of this as she has been somewhat of a poor historian.  She initially describes several months of symptoms in the left foot and then states it has only been a couple weeks.  She  does have a nonhealing dog bite on her left shin as pictured above.  I have recommended aortogram with lower extremity arteriogram with a focus on the left leg.  Discussed will evaluate if she is a thrombolytics candidate versus other endovascular invention versus needing a redo bypass if she has a target.  Risk and benefits discussed.  We will get scheduled for Thursday in the Cath Lab with me.   [name], MD [facility] [place] Office: [phone]

## 2022-07-26 ENCOUNTER — Encounter (HOSPITAL_COMMUNITY)
Admission: AD | Disposition: A | Discharge status: Home / Self Care | Payer: Self-pay | Source: Home / Self Care | Attending: Vascular Surgery

## 2022-07-26 ENCOUNTER — Other Ambulatory Visit: Payer: Self-pay

## 2022-07-26 ENCOUNTER — Observation Stay (HOSPITAL_COMMUNITY)
Admission: AD | Admit: 2022-07-26 | Discharge: 2022-07-27 | Disposition: A | Discharge status: Home / Self Care | Payer: Medicare Other | Attending: Vascular Surgery | Admitting: Vascular Surgery

## 2022-07-26 DIAGNOSIS — W540XXA Bitten by dog, initial encounter: Secondary | ICD-10-CM | POA: Diagnosis not present

## 2022-07-26 DIAGNOSIS — S81852A Open bite, left lower leg, initial encounter: Secondary | ICD-10-CM | POA: Diagnosis not present

## 2022-07-26 DIAGNOSIS — E785 Hyperlipidemia, unspecified: Secondary | ICD-10-CM | POA: Insufficient documentation

## 2022-07-26 DIAGNOSIS — I2510 Atherosclerotic heart disease of native coronary artery without angina pectoris: Secondary | ICD-10-CM | POA: Insufficient documentation

## 2022-07-26 DIAGNOSIS — J449 Chronic obstructive pulmonary disease, unspecified: Secondary | ICD-10-CM | POA: Diagnosis not present

## 2022-07-26 DIAGNOSIS — I10 Essential (primary) hypertension: Secondary | ICD-10-CM | POA: Insufficient documentation

## 2022-07-26 DIAGNOSIS — I70222 Atherosclerosis of native arteries of extremities with rest pain, left leg: Principal | ICD-10-CM | POA: Diagnosis present

## 2022-07-26 DIAGNOSIS — I7123 Aneurysm of the descending thoracic aorta, without rupture: Secondary | ICD-10-CM | POA: Diagnosis not present

## 2022-07-26 DIAGNOSIS — F17210 Nicotine dependence, cigarettes, uncomplicated: Secondary | ICD-10-CM | POA: Diagnosis not present

## 2022-07-26 DIAGNOSIS — I739 Peripheral vascular disease, unspecified: Secondary | ICD-10-CM | POA: Diagnosis present

## 2022-07-26 DIAGNOSIS — T82858A Stenosis of vascular prosthetic devices, implants and grafts, initial encounter: Secondary | ICD-10-CM | POA: Diagnosis not present

## 2022-07-26 DIAGNOSIS — I70229 Atherosclerosis of native arteries of extremities with rest pain, unspecified extremity: Secondary | ICD-10-CM

## 2022-07-26 DIAGNOSIS — Z9889 Other specified postprocedural states: Secondary | ICD-10-CM

## 2022-07-26 HISTORY — PX: ABDOMINAL AORTOGRAM W/LOWER EXTREMITY: CATH118223

## 2022-07-26 LAB — POCT I-STAT, CHEM 8
BUN: 27 mg/dL — ABNORMAL HIGH (ref 8–23)
Calcium, Ion: 1.2 mmol/L (ref 1.15–1.40)
Chloride: 98 mmol/L (ref 98–111)
Creatinine, Ser: 0.7 mg/dL (ref 0.44–1.00)
Glucose, Bld: 80 mg/dL (ref 70–99)
HCT: 45 % (ref 36.0–46.0)
Hemoglobin: 15.3 g/dL — ABNORMAL HIGH (ref 12.0–15.0)
Potassium: 3.7 mmol/L (ref 3.5–5.1)
Sodium: 136 mmol/L (ref 135–145)
TCO2: 27 mmol/L (ref 22–32)

## 2022-07-26 LAB — POCT ACTIVATED CLOTTING TIME
Activated Clotting Time: 233 seconds
Activated Clotting Time: 191 seconds
Activated Clotting Time: 179 seconds

## 2022-07-26 SURGERY — ABDOMINAL AORTOGRAM W/LOWER EXTREMITY
Anesthesia: LOCAL

## 2022-07-26 MED ORDER — SODIUM CHLORIDE 0.9% FLUSH: 3.0000 mL | Freq: Two times a day (BID) | INTRAVENOUS | Status: DC

## 2022-07-26 MED ORDER — CLOPIDOGREL BISULFATE 300 MG PO TABS
ORAL_TABLET | ORAL | Status: AC
  Filled 2022-07-26: qty 1

## 2022-07-26 MED ORDER — LIDOCAINE HCL (PF) 1 % IJ SOLN
INTRAMUSCULAR | Status: AC
  Filled 2022-07-26: qty 30

## 2022-07-26 MED ORDER — IODIXANOL 320 MG/ML IV SOLN
INTRAVENOUS | Status: DC | PRN
  Administered 2022-07-26: 135 mL

## 2022-07-26 MED ORDER — FENTANYL CITRATE (PF) 100 MCG/2ML IJ SOLN
INTRAMUSCULAR | Status: DC | PRN
  Administered 2022-07-26 (×3): 25 ug via INTRAVENOUS

## 2022-07-26 MED ORDER — ATORVASTATIN CALCIUM 80 MG PO TABS
80.0000 mg | ORAL_TABLET | Freq: Every day | ORAL | Status: DC
  Administered 2022-07-26: 80 mg via ORAL
  Filled 2022-07-26: qty 1

## 2022-07-26 MED ORDER — ACETAMINOPHEN 325 MG PO TABS: 650.0000 mg | ORAL_TABLET | ORAL | Status: DC | PRN

## 2022-07-26 MED ORDER — ALBUTEROL SULFATE (2.5 MG/3ML) 0.083% IN NEBU: 2.5000 mg | INHALATION_SOLUTION | Freq: Four times a day (QID) | RESPIRATORY_TRACT | Status: DC | PRN

## 2022-07-26 MED ORDER — HEPARIN SODIUM (PORCINE) 1000 UNIT/ML IJ SOLN
INTRAMUSCULAR | Status: AC
  Filled 2022-07-26: qty 10

## 2022-07-26 MED ORDER — MIDAZOLAM HCL 2 MG/2ML IJ SOLN
INTRAMUSCULAR | Status: DC | PRN
  Administered 2022-07-26: 1 mg via INTRAVENOUS

## 2022-07-26 MED ORDER — LABETALOL HCL 5 MG/ML IV SOLN
INTRAVENOUS | Status: AC
  Filled 2022-07-26: qty 4

## 2022-07-26 MED ORDER — HEPARIN (PORCINE) IN NACL 1000-0.9 UT/500ML-% IV SOLN
INTRAVENOUS | Status: AC
  Filled 2022-07-26: qty 1000

## 2022-07-26 MED ORDER — HEPARIN (PORCINE) IN NACL 1000-0.9 UT/500ML-% IV SOLN
INTRAVENOUS | Status: DC | PRN
  Administered 2022-07-26 (×2): 500 mL

## 2022-07-26 MED ORDER — SPIRONOLACTONE 25 MG PO TABS
25.0000 mg | ORAL_TABLET | Freq: Every day | ORAL | Status: DC
  Administered 2022-07-27: 25 mg via ORAL
  Filled 2022-07-26: qty 1

## 2022-07-26 MED ORDER — MIDAZOLAM HCL 2 MG/2ML IJ SOLN
INTRAMUSCULAR | Status: AC
  Filled 2022-07-26: qty 2

## 2022-07-26 MED ORDER — HYDRALAZINE HCL 20 MG/ML IJ SOLN
INTRAMUSCULAR | Status: AC
  Filled 2022-07-26: qty 1

## 2022-07-26 MED ORDER — FENTANYL CITRATE (PF) 100 MCG/2ML IJ SOLN
INTRAMUSCULAR | Status: AC
  Filled 2022-07-26: qty 2

## 2022-07-26 MED ORDER — CLOPIDOGREL BISULFATE 300 MG PO TABS
ORAL_TABLET | ORAL | Status: DC | PRN
  Administered 2022-07-26: 300 mg via ORAL

## 2022-07-26 MED ORDER — MORPHINE SULFATE (PF) 2 MG/ML IV SOLN: 2.0000 mg | INTRAVENOUS | Status: DC | PRN

## 2022-07-26 MED ORDER — SODIUM CHLORIDE 0.9 % IV SOLN: INTRAVENOUS | Status: DC

## 2022-07-26 MED ORDER — LABETALOL HCL 5 MG/ML IV SOLN
10.0000 mg | INTRAVENOUS | Status: DC | PRN
  Administered 2022-07-26: 10 mg via INTRAVENOUS

## 2022-07-26 MED ORDER — OXYCODONE HCL 5 MG PO TABS
ORAL_TABLET | ORAL | Status: AC
  Filled 2022-07-26: qty 2

## 2022-07-26 MED ORDER — SODIUM CHLORIDE 0.9 % IV SOLN: 250.0000 mL | INTRAVENOUS | Status: DC | PRN

## 2022-07-26 MED ORDER — CLOPIDOGREL BISULFATE 75 MG PO TABS
300.0000 mg | ORAL_TABLET | Freq: Once | ORAL | Status: AC
  Administered 2022-07-26: 300 mg via ORAL
  Filled 2022-07-26: qty 4

## 2022-07-26 MED ORDER — HYDRALAZINE HCL 20 MG/ML IJ SOLN
5.0000 mg | INTRAMUSCULAR | Status: DC | PRN
  Administered 2022-07-26: 5 mg via INTRAVENOUS

## 2022-07-26 MED ORDER — SODIUM CHLORIDE 0.9% FLUSH: 3.0000 mL | INTRAVENOUS | Status: DC | PRN

## 2022-07-26 MED ORDER — ASPIRIN 81 MG PO CHEW
CHEWABLE_TABLET | ORAL | Status: AC
  Filled 2022-07-26: qty 1

## 2022-07-26 MED ORDER — ONDANSETRON HCL 4 MG/2ML IJ SOLN: 4.0000 mg | Freq: Four times a day (QID) | INTRAMUSCULAR | Status: DC | PRN

## 2022-07-26 MED ORDER — HEPARIN SODIUM (PORCINE) 1000 UNIT/ML IJ SOLN
INTRAMUSCULAR | Status: DC | PRN
  Administered 2022-07-26: 6000 [IU] via INTRAVENOUS
  Administered 2022-07-26: 2000 [IU] via INTRAVENOUS

## 2022-07-26 MED ORDER — OXYCODONE HCL 5 MG PO TABS
10.0000 mg | ORAL_TABLET | Freq: Four times a day (QID) | ORAL | Status: DC | PRN
  Administered 2022-07-26 – 2022-07-27 (×3): 10 mg via ORAL
  Filled 2022-07-26 (×2): qty 2

## 2022-07-26 MED ORDER — ASPIRIN 81 MG PO CHEW
CHEWABLE_TABLET | ORAL | Status: DC | PRN
  Administered 2022-07-26: 81 mg via ORAL

## 2022-07-26 MED ORDER — CLOPIDOGREL BISULFATE 75 MG PO TABS: 75.0000 mg | ORAL_TABLET | Freq: Every day | ORAL | Status: DC

## 2022-07-26 MED ORDER — ASPIRIN 81 MG PO TBEC
81.0000 mg | DELAYED_RELEASE_TABLET | Freq: Every day | ORAL | Status: DC
  Administered 2022-07-27: 81 mg via ORAL
  Filled 2022-07-26: qty 1

## 2022-07-26 MED ORDER — SODIUM CHLORIDE 0.9 % IV SOLN: INTRAVENOUS | Status: AC

## 2022-07-26 MED ORDER — LIDOCAINE HCL (PF) 1 % IJ SOLN
INTRAMUSCULAR | Status: DC | PRN
  Administered 2022-07-26: 15 mL

## 2022-07-26 SURGICAL SUPPLY — 32 items
BALLN STERLING OTW 4X40X135 (BALLOONS) ×1
BALLN STERLING OTW 5X220X150 (BALLOONS) ×1
BALLN STERLING OTW 5X80X135 (BALLOONS) ×1
BALLN STERLING OTW 6X60X135 (BALLOONS) ×1
BALLOON STERLING OTW 4X40X135 (BALLOONS) IMPLANT
BALLOON STERLING OTW 5X220X150 (BALLOONS) IMPLANT
BALLOON STERLING OTW 5X80X135 (BALLOONS) IMPLANT
BALLOON STERLING OTW 6X60X135 (BALLOONS) IMPLANT
CATH JETI 6FR 120 (CATHETERS) IMPLANT
CATH OMNI FLUSH 5F 65CM (CATHETERS) IMPLANT
CATH QUICKCROSS .035X135CM (MICROCATHETER) IMPLANT
CATH QUICKCROSS SUPP .035X90CM (MICROCATHETER) IMPLANT
DEVICE TORQUE .025-.038 (MISCELLANEOUS) IMPLANT
GLIDEWIRE ADV .035X260CM (WIRE) IMPLANT
GUIDEWIRE ANGLED .035X260CM (WIRE) IMPLANT
KIT ACCESSORY JETI (KITS) IMPLANT
KIT ENCORE 26 ADVANTAGE (KITS) IMPLANT
KIT MICROPUNCTURE NIT STIFF (SHEATH) IMPLANT
KIT PV (KITS) ×1 IMPLANT
SHEATH PINNACLE 5F 10CM (SHEATH) IMPLANT
SHEATH PINNACLE 6F 10CM (SHEATH) IMPLANT
SHEATH PINNACLE MP 6F 45CM (SHEATH) IMPLANT
SHEATH PINNACLE ST 6F 45CM (SHEATH) IMPLANT
SHEATH PROBE COVER 6X72 (BAG) IMPLANT
STENT VIABAHN 6X150X120 (Permanent Stent) IMPLANT
STENT VIABAHN 6X250X120 (Permanent Stent) IMPLANT
SYR MEDRAD MARK V 150ML (SYRINGE) IMPLANT
TRANSDUCER W/STOPCOCK (MISCELLANEOUS) ×1 IMPLANT
TRAY PV CATH (CUSTOM PROCEDURE TRAY) ×1 IMPLANT
WIRE BENTSON .035X145CM (WIRE) IMPLANT
WIRE G V18X300CM (WIRE) IMPLANT
WIRE ROSEN-J .035X260CM (WIRE) IMPLANT

## 2022-07-26 NOTE — Progress Notes (Signed)
Site area: right groin  Site Prior to Removal:  Level 0  Pressure Applied For 30 MINUTES    Minutes Beginning at 1805  Manual:   Yes.    Patient Status During Pull:  Stable, but with frequent cough  Post Pull Groin Site:  Level 0  Post Pull Instructions Given:  Yes.    Post Pull Pulses Present:  Yes.    Dressing Applied:  Yes.    Comments:  Bed rest started at [place] X 4 hr. Small hematoma , level 1 , was expressed with manual pressure.

## 2022-07-26 NOTE — Progress Notes (Signed)
Patient with 800 cc of urine on bladder scan is retaining with urge to void and unable to do so. On-call physician notified for catheterization order.

## 2022-07-26 NOTE — Op Note (Signed)
Patient name: Marie Johnson MRN: [id number] DOB: September 18, 1950 Sex: female  07/26/2022 Pre-operative Diagnosis: Critical limb ischemia of the left lower extremity with occluded left common femoral to above-knee popliteal prosthetic bypass Post-operative diagnosis:  Same Surgeon:  [name], MD Procedure Performed: 1.  Ultrasound-guided access right common femoral artery 2.  Aortogram with catheter selection of aorta 3.  Left lower extremity arteriogram with selection of third order branches 4.  Left femoral-popliteal bypass percutaneous mechanical thrombectomy (JETI) 5.  Angioplasty and stent of left common femoral to above-knee popliteal bypass into the native above-knee popliteal artery (6 mm x 25 cm Viabahn and 6 mm x 15 cm Viabahn all postdilated with a 5 mm Sterling) 6.  91 minutes of monitored moderate conscious sedation time  Indications: Patient is a 71 year old female who previously underwent a left common femoral to above-knee popliteal bypass by Dr. Donnetta Hutching about 20 years ago.  She has had additional stenting of her iliac arteries for inflow of the bypass.  On Tuesday she was noted to have a occlusion of her bypass.  She presents today for percutaneous intervention to try and salvage the bypass after risk benefits discussed.  Findings:   Aortogram showed a very diseased infrarenal aorta with diseased bilateral iliac arteries.  There was no flow-limiting stenosis in her iliac arteries that we could visualize.  The left common femoral and profunda were patent.  Left common femoral to above-knee popliteal bypass had was occluded with a a stump proximally with acute appearing thrombus.  She did reconstitute an above-knee popliteal artery distal to the occluded bypass.  Dominant runoff in the peroneal artery with single vessel runoff  .  Ultimately we were able to get down into the occluded left leg bypass and performed percutaneous mechanical thrombectomy using the JETI device.   There appeared to be a significant chronic disease component within the bypass as well as there were multiple segments of high-grade stenosis that made passing catheters and wires difficult including in the mid segment of the bypass as well as the distal anastomosis.  Ultimately we realigned the bypass with 6 mm Viabahn stents postdilated with a 5 mm balloons and we stented from the native above-knee popliteal artery distal to the bypass all the way through the bypass up to the proximal anastomosis.  Excellent flow through the bypass at completion.  Preserved peroneal runoff distally.   Procedure:  The patient was identified in the holding area and taken to room 8.  The patient was then placed supine on the table and prepped and draped in the usual sterile fashion.  A time out was called.  The patient received Versed and fentanyl for moderate sedation.  I was present for all of moderate sedation.  Vital signs were monitored including heart rate, respiratory rate, oxygenation and blood pressure.  Ultrasound was used to evaluate the right common femoral artery.  It was patent .  A digital ultrasound image was acquired.  A micropuncture needle was used to access the right common femoral artery under ultrasound guidance.  An 018 wire was advanced without resistance and a micropuncture sheath was placed.  The 018 wire was removed and a benson wire was placed.  The micropuncture sheath was exchanged for a 5 french sheath.  An omniflush catheter was advanced over the wire to the level of L-1.  An abdominal angiogram was obtained.  Next, using the omniflush catheter and a benson wire, the aortic bifurcation was crossed and the catheter was  placed into the left external iliac artery and left leg runoff was obtained.  After evaluating images we elected to try and intervene on the occluded bypass.  We then used a Glidewire advantage to get into the bypass from contralateral groin access.  We placed a long 6 [place] Ansell  sheath in the right groin over the aortic bifurcation into the left iliac artery.  Patient was given 100 units/kg IV heparin.  We checked an ACT to maintain greater than 250.  Ultimately I then used a quick cross catheter to get down through the occluded bypass.  I had a lot of trouble crossing the distal anastomosis of the bypass onto the above-knee popliteal artery as this was heavily diseased.  Finally I was able to get a wire into the native artery distally with a V18.  I then used the JETI percutaneous thrombectomy device with aspiration and made a total of 2 passes through the bypass retrieving all acute appearing thrombus.  I shot an image and there was still no flow through the bypass.  I then angioplastied the distal anastomosis onto the above-knee popliteal artery with a 4 mm Sterling and there was still no flow in the bypass.  I ultimately elected to realign the bypass with Viabahn stents.  I placed a 6 mm x 25 cm Viabahn distally across the anastomosis into the bypass and then a second 6 mm x 15 cm Viabahn up to the proximal anastomosis on the common femoral artery.  All this was postdilated with a 5 mm Mustang.  Excellent results.  There was preserved peroneal runoff distally.  Wires and catheters were removed.  Taken to holding to have sheath removed.  Plan: Excellent results after intervention today.  Her left leg bypass is now patent.  Will load on Plavix and do dual antiplatelet therapy with statin therapy.   [name], MD [facility] Office: [phone]

## 2022-07-26 NOTE — H&P (Signed)
History and Physical Interval Note:  95/09/8839 6:60 PM  ZELTA ENFIELD  has presented today for surgery, with the diagnosis of ischemia.  The various methods of treatment have been discussed with the patient and family. After consideration of risks, benefits and other options for treatment, the patient has consented to  Procedure(s): ABDOMINAL AORTOGRAM W/LOWER EXTREMITY (N/A) as a surgical intervention.  The patient's history has been reviewed, patient examined, no change in status, stable for surgery.  I have reviewed the patient's chart and labs.  Questions were answered to the patient's satisfaction.     [name]     Patient name: Marie Johnson MRN: [id number]        DOB: [birth year]          Sex: female   REASON FOR VISIT: 1 year follow-up for for surveillance of bilateral lower extremity bypass grafts   HPI: ELLARIE PICKING is a 71 y.o. female with multiple comorbidities as listed below including CAD, COPD, HTN, HLD, PAD and tobacco abuse the presents for 1 year follow-up and surveillance of her bilateral lower extremity bypass grafts.  She reports having more pain in her left leg over the last several months now worse over the past few weeks.  She also had a wound from a dog bite on her left shin.  Still smoking 6 cigarettes a day.   She has somewhat complex history.  She initially had a left common femoral to above-knee popliteal bypass by Dr. Donnetta Hutching about 20 years ago.  I initially saw her with bilateral lower extremity critical limb ischemia.  On 03/16/2020 she underwent left common and external iliac artery angioplasty with stent placement with a patent bypass that Dr. Donnetta Hutching had previously performed.  At a later date on 03/18/2020, I performed right common femoral endarterectomy with retrograde right common iliac artery stenting and then a right common femoral to below knee pop bypass with PTFE.   Also seen for descending thoracic aneurysm. 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       Past Medical History:  Diagnosis Date   AAA (abdominal aortic aneurysm) (HCC)      3.4 cm   Anemia     Anxiety     Aortic atherosclerosis (HCC)     Arthritis     Asthma     CAD (coronary artery disease)      a. Nonobstructive CAD by cath 07/17/13.   Carotid artery disease ([place])      Carotid US 8/22: Bilateral ICA 1-39; right subclavian stenosis; right thyroid nodule   CHD (congenital heart disease)      pt unaware???   Chest pain      a. Adm 10-07/2013 - CTA neg for PE/dissection, cath with nonobstructive disease, CP ?due to uncontrolled HTN vs coronary vasospasm.   CHF (congestive heart failure) (HCC)     COPD (chronic obstructive pulmonary disease) (HCC)     Coronary artery disease     Echocardiogram      Echocardiogram 06/2019: EF 60-65, impaired relaxation (Gr 1 DD), mild MAC, mild MR, mild to mod aortic valve sclerosis (no AS), trivial TR, mild LAE, normal RVSF, RVSP 30.7 (mildly elevated)  GERD (gastroesophageal reflux disease)     Hyperlipidemia     Hypertension     Neuropathy     OA (osteoarthritis)     Osteoporosis     PAD (peripheral artery disease) (HCC)      a. s/p L fem-pop bypass b. recent evaluation at [facility], unamenable to intervention   PAD (peripheral artery disease) ([place])     Palpitations 02/22/2016   Pneumonia     Pre-diabetes     Premature ventricular contraction     PUD (peptic ulcer disease)     PVD (peripheral vascular disease) ([place]) 07/17/2013   Thoracic aortic aneurysm (HCC)      Chest/aorta CTA 04/2022: 14 x 8 focal saccular aneurysm versus penetrating atherosclerotic ulcer involving left side of proximal portion of transverse aortic arch; descending thoracic aorta and proximal  abdominal aorta aneurysm 4.4 cm; aortic atherosclerosis; emphysema   Tobacco abuse     Wears dentures     Wears glasses             Past Surgical History:  Procedure Laterality Date   ABDOMINAL AORTOGRAM W/LOWER EXTREMITY N/A 03/09/2020    Procedure: ABDOMINAL AORTOGRAM W/LOWER EXTREMITY;  Surgeon: [name], MD;  Location: [facility];  Service: Cardiovascular;  Laterality: N/A;   ABDOMINAL HYSTERECTOMY       BREAST SURGERY        left cyst excision x 2   CARDIAC CATHETERIZATION        several years ago, nonobstructive, "50-60% blockages"   ENDARTERECTOMY FEMORAL Right 03/18/2020    Procedure: ENDARTERECTOMY FEMORAL WITH PROFUNDAPLASTY;  Surgeon: [name], MD;  Location: [facility];  Service: Vascular;  Laterality: Right;   FEMORAL-POPLITEAL BYPASS GRAFT Right 03/18/2020    Procedure: BYPASS GRAFT FEMORAL-POPLITEAL ARTERY using GORE PROPATEN VASCULAR GRAFT;  Surgeon: [name], MD;  Location: [facility];  Service: Vascular;  Laterality: Right;   HIP SURGERY        left - bars and screws placed   [place] Right 03/18/2020    Procedure: INSERTION OF GORE VIABAHN  ILIAC STENT;  Surgeon: [name], MD;  Location: [facility];  Service: Vascular;  Laterality: Right;   [place] N/A 07/17/2013    Procedure: LEFT HEART CATHETERIZATION WITH CORONARY ANGIOGRAM;  Surgeon: Larey Dresser, MD;  Location: [facility];  Service: Cardiovascular;  Laterality: N/A;   LOWER EXTREMITY ANGIOGRAM Right 03/18/2020    Procedure: RIGHT ILIAC ARTERIOGRAM WITH AN ILIAC STENT;  Surgeon: [name], MD;  Location: [facility];  Service: Vascular;  Laterality: Right;   MULTIPLE TOOTH EXTRACTIONS       PATCH ANGIOPLASTY Right 03/18/2020    Procedure: PATCH ANGIOPLASTY using a [place] OF THE PROFUNDA;  Surgeon: [name], MD;  Location: [facility];  Service: Vascular;  Laterality: Right;    PERIPHERAL VASCULAR CATHETERIZATION N/A 07/16/2016    Procedure: Abdominal Aortogram w/Lower Extremity;  Surgeon: Rosetta Posner, MD;  Location: [facility];  Service: Cardiovascular;  Laterality: N/A;   PERIPHERAL VASCULAR INTERVENTION Left 03/09/2020    Procedure: PERIPHERAL VASCULAR INTERVENTION;  Surgeon: [name], MD;  Location: [facility];  Service: Cardiovascular;  Laterality: Left;  common and external illiac    ROTATOR CUFF REPAIR        right side, had torn ligaments as well   TUBAL LIGATION  VEIN BYPASS SURGERY        left leg   WRIST ARTHROCENTESIS        left - broke in 3 places so has bars and screws placed           Family History  Problem Relation Age of Onset   Cervical cancer Mother     Heart disease Mother          MI in 70s   Liver cancer Father     Heart disease Father     Lung cancer Maternal Aunt     Prostate cancer Maternal Uncle          met. anal   Breast cancer Maternal Grandmother     Lung cancer Maternal Aunt     Lung cancer Maternal Aunt     Stomach cancer Maternal Aunt     Colon cancer Maternal Aunt     Lung cancer Maternal Uncle        SOCIAL HISTORY: Social History         Tobacco Use   Smoking status: Every Day      Years: 50.00      Types: Cigarettes   Smokeless tobacco: Never  Substance Use Topics   Alcohol use: Never           Allergies  Allergen Reactions   Bee Venom Anaphylaxis   Bee Venom Anaphylaxis and Other (See Comments)      Welts, also   Insect Extract Anaphylaxis and Other (See Comments)      Insect bites- Welts, also   Nsaids Nausea Only and Other (See Comments)      Patient is not suppose to have this class of medication (liver)   Latex Rash   Latex Rash            Current Outpatient Medications  Medication Sig Dispense Refill   albuterol (PROVENTIL HFA;VENTOLIN HFA) 108 (90 BASE) MCG/ACT inhaler Inhale 2 puffs into the lungs every 6 (six) hours as needed for wheezing.         albuterol (PROVENTIL) (2.5 MG/3ML) 0.083% nebulizer solution Take 2.5 mg by nebulization every 6 (six) hours as needed for wheezing or shortness of breath.       alendronate (FOSAMAX) 70 MG tablet Take 70 mg by mouth every Friday.        allopurinol (ZYLOPRIM) 300 MG tablet Take 300 mg by mouth daily.        aspirin EC 81 MG tablet Take 81 mg by mouth daily.        atorvastatin (LIPITOR) 80 MG tablet Take 80 mg by mouth at bedtime.       cholecalciferol (VITAMIN D3) 25 MCG (1000 UNIT) tablet Take 5,000 Units by mouth daily.       diphenhydrAMINE (BENADRYL) 25 MG tablet Take 50 mg by mouth at bedtime.       EPIPEN 2-PAK 0.3 MG/0.3ML SOAJ injection Inject 0.3 mg into the muscle daily as needed. Use as directed as needed for allergies.       ezetimibe (ZETIA) 10 MG tablet TAKE 1 TABLET BY MOUTH EVERY DAY 90 tablet 2   furosemide (LASIX) 40 MG tablet Take 1 tablet (40 mg total) by mouth daily. For 5 days only then repeat labs. 30 tablet 0   lactulose (CHRONULAC) 10 GM/15ML solution Take 20 g by mouth as needed for moderate constipation.        Menthol-Methyl Salicylate (MUSCLE RUB) 10-15 % CREA Apply 1 application topically  daily as needed (for arthritis pain).       nitroGLYCERIN (NITROSTAT) 0.4 MG SL tablet Place 1 tablet (0.4 mg total) under the tongue every 5 (five) minutes x 3 doses as needed for chest pain. 25 tablet 4   Oxycodone HCl 10 MG TABS Take 1 tablet (10 mg total) by mouth 4 (four) times daily as needed (for pain). 20 tablet 0   pantoprazole (PROTONIX) 40 MG tablet Take 40 mg by mouth 2 (two) times daily.       potassium chloride SA (KLOR-CON M20) 20 MEQ tablet Take 1 tablet (20 mEq total) by mouth daily. 90 tablet 3   senna-docusate (SENOKOT-S) 8.6-50 MG tablet Take 2 tablets by mouth at bedtime.       spironolactone (ALDACTONE) 25 MG tablet Take 1 tablet (25 mg total) by mouth daily. 90 tablet 3   verapamil (CALAN-SR) 240 MG CR tablet TAKE 1 TABLET BY MOUTH EVERYDAY AT BEDTIME 90  tablet 2    No current facility-administered medications for this visit.             Facility-Administered Medications Ordered in Other Visits  Medication Dose Route Frequency Provider Last Rate Last Admin   DOBUTamine (DOBUTREX) 1,000 mcg/mL in dextrose 5% 250 mL infusion  30 mcg/kg/min Intravenous Titrated Dorothy Spark, MD          REVIEW OF SYSTEMS:  _0  denotes positive finding, _1  denotes negative finding Cardiac   Comments:  Chest pain or chest pressure:      Shortness of breath upon exertion:      Short of breath when lying flat:      Irregular heart rhythm:             Vascular      Pain in calf, thigh, or hip brought on by ambulation:      Pain in feet at night that wakes you up from your sleep:       Blood clot in your veins:      Leg swelling:              Pulmonary      Oxygen at home:      Productive cough:       Wheezing:              Neurologic      Sudden weakness in arms or legs:       Sudden numbness in arms or legs:       Sudden onset of difficulty speaking or slurred speech:      Temporary loss of vision in one eye:       Problems with dizziness:              Gastrointestinal      Blood in stool:       Vomited blood:              Genitourinary      Burning when urinating:       Blood in urine:             Psychiatric      Major depression:              Hematologic      Bleeding problems:      Problems with blood clotting too easily:             Skin      Rashes or ulcers:  Constitutional      Fever or chills:          PHYSICAL EXAM:    Vitals:    07/24/22 0920  BP: (!) 172/79  Pulse: 87  Resp: 20  Temp: 98.2 F (36.8 C)  SpO2: 93%  Weight: 126 lb (57.2 kg)  Height: _0  (1.727 m)      GENERAL: The patient is a well-nourished female, in no acute distress. The vital signs are documented above. CARDIAC: There is a regular rate and rhythm.  VASCULAR:  Palpable femoral pulses bilaterally Brisk triphasic  right PT signal No palpable left pedal pulses, monophasic PT/DP signal Left shin wound as pictured PULMONARY: No respiratory distress. ABDOMEN: Soft and non-tender. MUSCULOSKELETAL: There are no major deformities or cyanosis. NEUROLOGIC: No focal weakness or paresthesias are detected.        DATA:    Bilateral iliac stents are patent with no stenosis.   Left leg bypass appears occluded.   Right leg bypass patent with no stenosis   ABIs 0.96 on the right triphasic and 0.4 on the left dampened monophasic   Assessment/Plan:   71 y.o. female presents for surveillance of her PAD.  Her bilateral iliac stents appear patent with no identified stenosis.  Her right leg bypass remains patent with no stenosis.  On the left her bypass is occluded.  I am unclear about the acuity of this as she has been somewhat of a poor historian.  She initially describes several months of symptoms in the left foot and then states it has only been a couple weeks.  She does have a nonhealing dog bite on her left shin as pictured above.  I have recommended aortogram with lower extremity arteriogram with a focus on the left leg.  Discussed will evaluate if she is a thrombolytics candidate versus other endovascular invention versus needing a redo bypass if she has a target.  Risk and benefits discussed.  We will get scheduled for Thursday in the Cath Lab with me.     [name], MD [facility] Office: [phone]

## 2022-07-26 NOTE — Progress Notes (Signed)
Patient in pain related to urine retention. In and Out catheterization performed with 1000 cc's out, and zero residual following catheterization. Patient reports relief, we will monitor with an expectation that patient will void within the next 6 hours.

## 2022-07-27 ENCOUNTER — Other Ambulatory Visit: Payer: Self-pay

## 2022-07-27 ENCOUNTER — Encounter (HOSPITAL_COMMUNITY): Payer: Self-pay | Admitting: Vascular Surgery

## 2022-07-27 DIAGNOSIS — E785 Hyperlipidemia, unspecified: Secondary | ICD-10-CM | POA: Diagnosis not present

## 2022-07-27 DIAGNOSIS — I2510 Atherosclerotic heart disease of native coronary artery without angina pectoris: Secondary | ICD-10-CM

## 2022-07-27 DIAGNOSIS — I7123 Aneurysm of the descending thoracic aorta, without rupture: Secondary | ICD-10-CM | POA: Diagnosis not present

## 2022-07-27 DIAGNOSIS — I10 Essential (primary) hypertension: Secondary | ICD-10-CM

## 2022-07-27 DIAGNOSIS — I739 Peripheral vascular disease, unspecified: Secondary | ICD-10-CM

## 2022-07-27 DIAGNOSIS — I70222 Atherosclerosis of native arteries of extremities with rest pain, left leg: Secondary | ICD-10-CM | POA: Diagnosis not present

## 2022-07-27 DIAGNOSIS — F17210 Nicotine dependence, cigarettes, uncomplicated: Secondary | ICD-10-CM | POA: Diagnosis not present

## 2022-07-27 DIAGNOSIS — E7849 Other hyperlipidemia: Secondary | ICD-10-CM

## 2022-07-27 DIAGNOSIS — M791 Myalgia, unspecified site: Secondary | ICD-10-CM

## 2022-07-27 DIAGNOSIS — J449 Chronic obstructive pulmonary disease, unspecified: Secondary | ICD-10-CM | POA: Diagnosis not present

## 2022-07-27 DIAGNOSIS — S81852A Open bite, left lower leg, initial encounter: Secondary | ICD-10-CM | POA: Diagnosis not present

## 2022-07-27 LAB — CBC
HCT: 35.9 % — ABNORMAL LOW (ref 36.0–46.0)
Hemoglobin: 12.7 g/dL (ref 12.0–15.0)
MCH: 34.1 pg — ABNORMAL HIGH (ref 26.0–34.0)
MCHC: 35.4 g/dL (ref 30.0–36.0)
MCV: 96.5 fL (ref 80.0–100.0)
Platelets: 209 10*3/uL (ref 150–400)
RBC: 3.72 MIL/uL — ABNORMAL LOW (ref 3.87–5.11)
RDW: 19.9 % — ABNORMAL HIGH (ref 11.5–15.5)
WBC: 5.8 10*3/uL (ref 4.0–10.5)
nRBC: 0.3 % — ABNORMAL HIGH (ref 0.0–0.2)

## 2022-07-27 LAB — BASIC METABOLIC PANEL
Anion gap: 9 (ref 5–15)
BUN: 15 mg/dL (ref 8–23)
CO2: 21 mmol/L — ABNORMAL LOW (ref 22–32)
Calcium: 8.6 mg/dL — ABNORMAL LOW (ref 8.9–10.3)
Chloride: 104 mmol/L (ref 98–111)
Creatinine, Ser: 0.65 mg/dL (ref 0.44–1.00)
GFR, Estimated: >60 mL/min (ref >60)
Glucose, Bld: 101 mg/dL — ABNORMAL HIGH (ref 70–99)
Potassium: 3.3 mmol/L — ABNORMAL LOW (ref 3.5–5.1)
Sodium: 134 mmol/L — ABNORMAL LOW (ref 135–145)

## 2022-07-27 MED ORDER — POTASSIUM CHLORIDE CRYS ER 20 MEQ PO TBCR: 20.0000 meq | EXTENDED_RELEASE_TABLET | Freq: Every day | ORAL | 1 refills | Status: DC

## 2022-07-27 MED ORDER — CLOPIDOGREL BISULFATE 75 MG PO TABS: 75.0000 mg | ORAL_TABLET | Freq: Every day | ORAL | 11 refills | Status: DC

## 2022-07-27 NOTE — Plan of Care (Signed)
  Problem: Activity: Goal: Ability to return to baseline activity level will improve Outcome: Adequate for Discharge   Problem: Cardiovascular: Goal: Ability to achieve and maintain adequate cardiovascular perfusion will improve Outcome: Adequate for Discharge Goal: Vascular access site(s) Level 0-1 will be maintained Outcome: Adequate for Discharge   Problem: Education: Goal: Understanding of CV disease, CV risk reduction, and recovery process will improve Outcome: Adequate for Discharge Goal: Individualized Educational Video(s) Outcome: Adequate for Discharge   Problem: Health Behavior/Discharge Planning: Goal: Ability to safely manage health-related needs after discharge will improve Outcome: Adequate for Discharge   Problem: Education: Goal: Knowledge of General Education information will improve Description: Including pain rating scale, medication(s)/side effects and non-pharmacologic comfort measures Outcome: Adequate for Discharge   Problem: Health Behavior/Discharge Planning: Goal: Ability to manage health-related needs will improve Outcome: Adequate for Discharge   Problem: Clinical Measurements: Goal: Ability to maintain clinical measurements within normal limits will improve Outcome: Adequate for Discharge Goal: Will remain free from infection Outcome: Adequate for Discharge Goal: Diagnostic test results will improve Outcome: Adequate for Discharge Goal: Respiratory complications will improve Outcome: Adequate for Discharge Goal: Cardiovascular complication will be avoided Outcome: Adequate for Discharge   Problem: Activity: Goal: Risk for activity intolerance will decrease Outcome: Adequate for Discharge   Problem: Nutrition: Goal: Adequate nutrition will be maintained Outcome: Adequate for Discharge   Problem: Coping: Goal: Level of anxiety will decrease Outcome: Adequate for Discharge   Problem: Elimination: Goal: Will not experience complications  related to bowel motility Outcome: Adequate for Discharge Goal: Will not experience complications related to urinary retention Outcome: Adequate for Discharge   Problem: Pain Managment: Goal: General experience of comfort will improve Outcome: Adequate for Discharge   Problem: Safety: Goal: Ability to remain free from injury will improve Outcome: Adequate for Discharge   Problem: Skin Integrity: Goal: Risk for impaired skin integrity will decrease Outcome: Adequate for Discharge

## 2022-07-27 NOTE — Progress Notes (Signed)
Explained discharge instructions to patient. Reviewed follow up appointment and next medication administration times. Also reviewed education. Patient verbalized having an understanding for instructions given. All belongings are in the patient's possession.. IV and telemetry were removed by floor staff.  No other needs verbalized. Transported downstairs for discharge.

## 2022-07-27 NOTE — Discharge Instructions (Signed)
° °  [facility] ° °Discharge Instructions ° °Lower Extremity Angiogram; Angioplasty/Stenting ° °Please refer to the following instructions for your post-procedure care. Your surgeon or physician assistant will discuss any changes with you. ° °Activity ° °Avoid lifting more than 8 pounds (1 gallons of milk) for 72 hours (3 days) after your procedure. You may walk as much as you can tolerate. It's OK to drive after 72 hours. ° °Bathing/Showering ° °You may shower the day after your procedure. If you have a bandage, you may remove it at 24- 48 hours. Clean your incision site with mild soap and water. Pat the area dry with a clean towel. ° °Diet ° °Resume your pre-procedure diet. There are no special food restrictions following this procedure. All patients with peripheral vascular disease should follow a low fat/low cholesterol diet. In order to heal from your surgery, it is CRITICAL to get adequate nutrition. Your body requires vitamins, minerals, and protein. Vegetables are the best source of vitamins and minerals. Vegetables also provide the perfect balance of protein. Processed food has little nutritional value, so try to avoid this. ° °Medications ° °Resume taking all of your medications unless your doctor tells you not to. If your incision is causing pain, you may take over-the-counter pain relievers such as acetaminophen (Tylenol) ° °Follow Up ° °Follow up will be arranged at the time of your procedure. You may have an office visit scheduled or may be scheduled for surgery. Ask your surgeon if you have any questions. ° °Please call us immediately for any of the following conditions: °•Severe or worsening pain your legs or feet at rest or with walking. °•Increased pain, redness, drainage at your groin puncture site. °•Fever of 101 degrees or higher. °•If you have any mild or slow bleeding from your puncture site: lie down, apply firm constant pressure over the area with a piece of  gauze or a clean wash cloth for 30 minutes- no peeking!, call 911 right away if you are still bleeding after 30 minutes, or if the bleeding is heavy and unmanageable. ° °Reduce your risk factors of vascular disease: ° °Stop smoking. If you would like help call [facility] at 1-800-QUIT-NOW (1-800-784-8669) or [place] at 336-586-4000. °Manage your cholesterol °Maintain a desired weight °Control your diabetes °Keep your blood pressure down ° °If you have any questions, please call the office at [phone] ° °

## 2022-07-27 NOTE — Care Management Obs Status (Signed)
[facility] NOTIFICATION   Patient Details  Name: Marie Johnson MRN: [id number] Date of Birth: May 06, 1951   Medicare Observation Status Notification Given:  Yes    Joanne Chars, LCSW 07/27/2022, 10:02 AM

## 2022-07-27 NOTE — Care Management CC44 (Signed)
Condition Code 44 Documentation Completed  Patient Details  Name: AYODELE HARTSOCK MRN: [id number] Date of Birth: 12-15-1950   Condition Code 44 given:  Yes Patient signature on Condition Code 44 notice:  Yes Documentation of 2 MD's agreement:  Yes Code 44 added to claim:  Yes    Joanne Chars, LCSW 07/27/2022, 10:02 AM

## 2022-07-27 NOTE — Progress Notes (Addendum)
[facility]  Subjective  -    Objective (!) 153/92 88 97.8 F (36.6 C) (Oral) 14 96%  Intake/Output Summary (Last 24 hours) at 07/27/2022 8416 Last data filed at 07/26/2022 2140 Gross per 24 hour  Intake 201.25 ml  Output 1000 ml  Net -798.75 ml   Left LE brisk peroneal/AT doppler signal Right groin soft without hematoma  Lungs non labored breathing Voiding post in/out cath for retention   Assessment/Planning: POD # 1 Left femoral-popliteal bypass percutaneous mechanical thrombectomy and  Angioplasty and stent of left common femoral to above-knee popliteal bypass into the native above-knee popliteal artery (6 mm x 25 cm Viabahn and 6 mm x 15 cm Viabahn all postdilated with a 5 mm Sterling)   Successful re vascularization of the left LE bypass Brisk doppler signals She will be discharged on ASA, Plavix and daily Stain.  Increase activity as tolerates. F/U in 4 weeks with Dr. Carlis Abbott.  Roxy Horseman 07/27/2022 7:12 AM --  Laboratory Lab Results: Recent Labs    07/26/22 1031 07/27/22 0112  WBC  --  5.8  HGB 15.3* 12.7  HCT 45.0 35.9*  PLT  --  209   BMET Recent Labs    07/26/22 1031 07/27/22 0112  NA 136 134*  K 3.7 3.3*  CL 98 104  CO2  --  21*  GLUCOSE 80 101*  BUN 27* 15  CREATININE 0.70 0.65  CALCIUM  --  8.6*    COAG Lab Results  Component Value Date   INR 1.1 03/15/2020   INR 1.1 03/09/2020   INR 1.1 03/08/2020   No results found for: "PTT"  I have seen and evaluated the patient. I agree with the PA note as documented above.  Postprocedure day 1 status post JETI percutaneous thrombectomy of occluded left common femoral to above-knee popliteal bypass with then relining with Viabahn stents for chronic disease.  Bypass is now patent.  She has a brisk triphasic peroneal signal in the left foot.  Right groin is clean and dry with no hematoma after sheath was pulled.  Discussed aspirin statin Plavix.  Smoking  cessation.  We will arrange follow-up in 1 month with left leg arterial duplex and ABIs.  I&O overnight and has now voided.  [name], MD [facility] Office: [phone]

## 2022-07-28 DIAGNOSIS — Z79899 Other long term (current) drug therapy: Secondary | ICD-10-CM | POA: Diagnosis not present

## 2022-07-28 DIAGNOSIS — M25552 Pain in left hip: Secondary | ICD-10-CM | POA: Diagnosis not present

## 2022-07-28 DIAGNOSIS — G8929 Other chronic pain: Secondary | ICD-10-CM | POA: Diagnosis not present

## 2022-07-28 DIAGNOSIS — Z681 Body mass index (BMI) 19 or less, adult: Secondary | ICD-10-CM | POA: Diagnosis not present

## 2022-07-28 DIAGNOSIS — Z6820 Body mass index (BMI) 20.0-20.9, adult: Secondary | ICD-10-CM | POA: Diagnosis not present

## 2022-07-28 DIAGNOSIS — I739 Peripheral vascular disease, unspecified: Secondary | ICD-10-CM | POA: Diagnosis not present

## 2022-07-28 DIAGNOSIS — Z79891 Long term (current) use of opiate analgesic: Secondary | ICD-10-CM | POA: Diagnosis not present

## 2022-07-28 DIAGNOSIS — R030 Elevated blood-pressure reading, without diagnosis of hypertension: Secondary | ICD-10-CM | POA: Diagnosis not present

## 2022-07-28 DIAGNOSIS — M25551 Pain in right hip: Secondary | ICD-10-CM | POA: Diagnosis not present

## 2022-07-31 DIAGNOSIS — I739 Peripheral vascular disease, unspecified: Secondary | ICD-10-CM | POA: Diagnosis not present

## 2022-07-31 DIAGNOSIS — L97929 Non-pressure chronic ulcer of unspecified part of left lower leg with unspecified severity: Secondary | ICD-10-CM | POA: Diagnosis not present

## 2022-07-31 DIAGNOSIS — I70248 Atherosclerosis of native arteries of left leg with ulceration of other part of lower left leg: Secondary | ICD-10-CM | POA: Diagnosis not present

## 2022-08-01 NOTE — Discharge Summary (Signed)
[facility] Discharge Summary   Patient ID:  Marie Johnson MRN: [id number] DOB/AGE: 73-07-1951 71 y.o.  Admit date: 07/26/2022 Discharge date: 07/27/22 Date of Surgery: 07/26/2022 Surgeon: Surgeon(s): [name], MD  Admission Diagnosis: Critical limb ischemia of left lower extremity ([place]) [I70.222] PAD (peripheral artery disease) ([place]) [I73.9]  Discharge Diagnoses:  Critical limb ischemia of left lower extremity ([place]) [I70.222] PAD (peripheral artery disease) ([place]) [I73.9]  Secondary Diagnoses: Past Medical History:  Diagnosis Date   AAA (abdominal aortic aneurysm) ([place])    3.4 cm   Anemia    Anxiety    Aortic atherosclerosis (HCC)    Arthritis    Asthma    CAD (coronary artery disease)    a. Nonobstructive CAD by cath 07/17/13.   Carotid artery disease ([place])    Carotid US 8/22: Bilateral ICA 1-39; right subclavian stenosis; right thyroid nodule   CHD (congenital heart disease)    pt unaware???   Chest pain    a. Adm 10-07/2013 - CTA neg for PE/dissection, cath with nonobstructive disease, CP ?due to uncontrolled HTN vs coronary vasospasm.   CHF (congestive heart failure) (HCC)    COPD (chronic obstructive pulmonary disease) (HCC)    Coronary artery disease    Echocardiogram    Echocardiogram 06/2019: EF 60-65, impaired relaxation (Gr 1 DD), mild MAC, mild MR, mild to mod aortic valve sclerosis (no AS), trivial TR, mild LAE, normal RVSF, RVSP 30.7 (mildly elevated)   GERD (gastroesophageal reflux disease)    Hyperlipidemia    Hypertension    Neuropathy    OA (osteoarthritis)    Osteoporosis    PAD (peripheral artery disease) (HCC)    a. s/p L fem-pop bypass b. recent evaluation at [facility], unamenable to intervention   PAD (peripheral artery disease) ([place])    Palpitations 02/22/2016   Pneumonia    Pre-diabetes    Premature ventricular contraction    PUD (peptic ulcer disease)    PVD (peripheral vascular disease) ([place]) 07/17/2013    Thoracic aortic aneurysm (HCC)    Chest/aorta CTA 04/2022: 14 x 8 focal saccular aneurysm versus penetrating atherosclerotic ulcer involving left side of proximal portion of transverse aortic arch; descending thoracic aorta and proximal abdominal aorta aneurysm 4.4 cm; aortic atherosclerosis; emphysema   Tobacco abuse    Wears dentures    Wears glasses     Procedure(s): ABDOMINAL AORTOGRAM W/LOWER EXTREMITY  Discharged Condition: stable  HPI: Patient is a 71 year old female who previously underwent a left common femoral to above-knee popliteal bypass by Dr. Donnetta Hutching about 20 years ago.  She has had additional stenting of her iliac arteries for inflow of the bypass.  At her office visit 07/24/22 she was noted to have a occlusion of her bypass. She was schedule for surgical intervention.     Hospital Course:  [name] is a 71 y.o. female is S/P Left Procedure(s): ABDOMINAL AORTOGRAM W/LOWER EXTREMITY  Left femoral-popliteal bypass percutaneous mechanical thrombectomy and  Angioplasty and stent of left common femoral to above-knee popliteal bypass into the native above-knee popliteal artery (6 mm x 25 cm Viabahn and 6 mm x 15 cm Viabahn all postdilated with a 5 mm Sterling)   Successful re vascularization of the left LE bypass Brisk doppler signals She will be discharged on ASA, Plavix and daily Stain.  Increase activity as tolerates. F/U in 4 weeks  left leg arterial duplex and ABIs with  Dr. Carlis Abbott.  Significant Diagnostic Studies: CBC Lab Results  Component Value  Date   WBC 5.8 07/27/2022   HGB 12.7 07/27/2022   HCT 35.9 (L) 07/27/2022   MCV 96.5 07/27/2022   PLT 209 07/27/2022    BMET    Component Value Date/Time   NA 134 (L) 07/27/2022 0112   NA 133 (L) 04/17/2022 0740   K 3.3 (L) 07/27/2022 0112   CL 104 07/27/2022 0112   CO2 21 (L) 07/27/2022 0112   GLUCOSE 101 (H) 07/27/2022 0112   BUN 15 07/27/2022 0112   BUN 20 04/17/2022 0740   CREATININE 0.65 07/27/2022 0112    CREATININE 0.83 02/22/2016 1007   CALCIUM 8.6 (L) 07/27/2022 0112   GFRNONAA >60 07/27/2022 0112   GFRAA 85 07/01/2020 1141   COAG Lab Results  Component Value Date   INR 1.1 03/15/2020   INR 1.1 03/09/2020   INR 1.1 03/08/2020     Disposition:  Discharge to :Home Discharge Instructions     Admit to Inpatient (patient's expected length of stay will be greater than 2 midnights or inpatient only procedure)   Complete by: As directed    [place]: [facility]: Progressive   Admit to Progressive based on following criteria: CARDIOVASCULAR & THORACIC of moderate stability with acute coronary syndrome symptoms/low risk myocardial infarction/hypertensive urgency/arrhythmias/heart failure potentially compromising stability and stable post cardiovascular intervention patients.   Covid Evaluation: Asymptomatic - no recent exposure (last 10 days) testing not required   Diagnosis: PAD (peripheral artery disease) (HCC)   Level of Care: Telemetry   Admitting Physician: [name]   Estimated length of stay: inpatient only procedure   Certification: I certify this patient is being admitted for an inpatient-only procedure   Attending Physician: [name]   Call MD for:  redness, tenderness, or signs of infection (pain, swelling, bleeding, redness, odor or green/yellow discharge around incision site)   Complete by: As directed    Call MD for:  severe or increased pain, loss or decreased feeling  in affected limb(s)   Complete by: As directed    Call MD for:  temperature >100.5   Complete by: As directed    Resume previous diet   Complete by: As directed       Allergies as of 07/27/2022       Reactions   Bee Venom Anaphylaxis   Insect Extract Anaphylaxis, Other (See Comments)   Insect bites- Welts, also   Nsaids Nausea Only, Other (See Comments)   Patient is not suppose to have this class of medication (liver)   Latex Rash         Medication List     TAKE these medications    albuterol (2.5 MG/3ML) 0.083% nebulizer solution Commonly known as: PROVENTIL Take 2.5 mg by nebulization every 6 (six) hours as needed for wheezing or shortness of breath.   albuterol 108 (90 Base) MCG/ACT inhaler Commonly known as: VENTOLIN HFA Inhale 2 puffs into the lungs every 6 (six) hours as needed for wheezing or shortness of breath.   alendronate 70 MG tablet Commonly known as: FOSAMAX Take 70 mg by mouth every Monday.   allopurinol 300 MG tablet Commonly known as: ZYLOPRIM Take 300 mg by mouth daily.   aspirin EC 81 MG tablet Take 81 mg by mouth daily.   atorvastatin 80 MG tablet Commonly known as: LIPITOR Take 80 mg by mouth at bedtime.   clopidogrel 75 MG tablet Commonly known as: Plavix Take 1 tablet (75 mg total) by  mouth daily.   cyanocobalamin 1000 MCG tablet Commonly known as: VITAMIN B12 Take 1,000 mcg by mouth daily.   diphenhydrAMINE 25 MG tablet Commonly known as: BENADRYL Take 50 mg by mouth at bedtime.   EpiPen 2-Pak 0.3 mg/0.3 mL [place] injection Generic drug: EPINEPHrine Inject 0.3 mg into the muscle daily as needed for anaphylaxis. Use as directed as needed for allergies.   ezetimibe 10 MG tablet Commonly known as: ZETIA TAKE 1 TABLET BY MOUTH EVERY DAY   furosemide 40 MG tablet Commonly known as: LASIX Take 1 tablet (40 mg total) by mouth daily. For 5 days only then repeat labs.   lactulose 10 GM/15ML solution Commonly known as: CHRONULAC Take 20 g by mouth 2 (two) times daily.   Muscle Rub 10-15 % Crea Apply 1 application topically daily as needed (for arthritis pain).   nitroGLYCERIN 0.4 MG SL tablet Commonly known as: NITROSTAT Place 1 tablet (0.4 mg total) under the tongue every 5 (five) minutes x 3 doses as needed for chest pain.   Oxycodone HCl 10 MG Tabs Take 1 tablet (10 mg total) by mouth 4 (four) times daily as needed (for pain).   pantoprazole 40 MG  tablet Commonly known as: PROTONIX Take 40 mg by mouth 2 (two) times daily.   senna-docusate 8.6-50 MG tablet Commonly known as: Senokot-S Take 2 tablets by mouth at bedtime.   spironolactone 25 MG tablet Commonly known as: ALDACTONE Take 1 tablet (25 mg total) by mouth daily.   verapamil 240 MG CR tablet Commonly known as: CALAN-SR TAKE 1 TABLET BY MOUTH EVERYDAY AT BEDTIME   Vitamin D 125 MCG (5000 UT) Caps Take 5,000 Units by mouth daily.       Verbal and written Discharge instructions given to the patient. Wound care per Discharge AVS  Follow-up Information     [name], MD Follow up in 4 week(s).   Specialty: Vascular Surgery Why: Office will call you to arrange your appt (sent) Contact information: [place] [address] [phone]         Hamrick, Lorin Mercy, MD Follow up.   Specialty: Family Medicine Contact information: [place] [address] [phone]                 Signed: Roxy Horseman 08/01/2022, 6:35 AM

## 2022-08-14 ENCOUNTER — Ambulatory Visit: Payer: Medicare Other | Admitting: Cardiology

## 2022-08-14 NOTE — Progress Notes (Signed)
Cardiology Office Note:    Date:  98/92/1194   ID:  Marie Johnson, DOB [birth year], MRN [id number]  PCP:  Leonides Sake, MD   [facility] Providers Cardiologist:  Freada Bergeron, MD Cardiology APP:  Liliane Shi, PA-C  Electrophysiologist:  Thompson Grayer, MD  {  Referring MD: Leonides Sake, MD    History of Present Illness:    Marie Johnson is a 71 y.o. female with a hx of nonobstructive CAD, diastolic heart failure, PAD s/p L fem-pop bypass, L CIA and EIA stenting 02/2020, R CIA stent, RFA endartecetomy and right fem-pop bypass, AAA, COPD, HTN, HLD, and tobacco use who was previously followed by Dr. Meda Coffee who now returns to clinic for follow-up.  Was seen by me on 01/2021 where she was having worsening DOE and ECG showed frequent PVCs. We obtained myoview which was low risk. Zio showed NSR, <1% PVC burden. CT for lung cancer screening revealed thoracic aneurysm 59m.   Seen in clinic on 10/2021 where she was having LE pain due to chronic PAD, peripheral neuopathy and arthritis. Was seeing pain center. LE dopplers 07/2021 normal right ABI, abnormal TBI. Left ABI with mild LE disease and abnormal TBI. BNP normal.  Was last seen in clinic on 01/2022 where she was stable from a CV standpoint. Continued to have neuropathic pain and chronic DOE. CTA aorta 04/2022 showed 4 x 8 mm focal saccular aneurysm or penetrating atherosclerotic ulcer is seen involving the left side of the proximal portion of the transverse aortic arch and stable 4.4cm focal fusiform descending aortic aneurysm.   Was hospitalized on 11/9-11/10/23 for occluded popliteal bypass. She underwent Left femoral-popliteal bypass percutaneous mechanical thrombectomy and Angioplasty and stent of left common femoral to above-knee popliteal bypass into the native above-knee popliteal artery (6 mm x 25 cm Viabahn and 6 mm x 15 cm Viabahn all postdilated with a 5 mm Sterling) with Dr. CCarlis Abbott  Today, the patient states  that she is doing okay. Her LLE wound is starting to heal. Has some LLE edema. Continues to have significant dyspnea on exertion, which has worsened with the change of weather. No chest pain.   Reports that she has been having some blood tinged sputum from coughing.  Otherwise, blood pressure is well controlled. Tolerating medications and them as prescribed. Working on cutting back on smoking now 4-5 per day.    Past Medical History:  Diagnosis Date   AAA (abdominal aortic aneurysm) (HCC)    3.4 cm   Anemia    Anxiety    Aortic atherosclerosis (HCC)    Arthritis    Asthma    CAD (coronary artery disease)    a. Nonobstructive CAD by cath 07/17/13.   Carotid artery disease ([place]    Carotid [place]: Bilateral ICA 1-39; right subclavian stenosis; right thyroid nodule   CHD (congenital heart disease)    pt unaware???   Chest pain    a. Adm 10-07/2013 - CTA neg for PE/dissection, cath with nonobstructive disease, CP ?due to uncontrolled HTN vs coronary vasospasm.   CHF (congestive heart failure) (HCC)    COPD (chronic obstructive pulmonary disease) (HCC)    Coronary artery disease    Echocardiogram    Echocardiogram 06/2019: EF 60-65, impaired relaxation (Gr 1 DD), mild MAC, mild MR, mild to mod aortic valve sclerosis (no AS), trivial TR, mild LAE, normal RVSF, RVSP 30.7 (mildly elevated)   GERD (gastroesophageal reflux disease)    Hyperlipidemia  Hypertension    Neuropathy    OA (osteoarthritis)    Osteoporosis    PAD (peripheral artery disease) (HCC)    a. s/p L fem-pop bypass b. recent evaluation at [facility], unamenable to intervention   PAD (peripheral artery disease) ([place])    Palpitations 02/22/2016   Pneumonia    Pre-diabetes    Premature ventricular contraction    PUD (peptic ulcer disease)    PVD (peripheral vascular disease) ([place]) 07/17/2013   Thoracic aortic aneurysm (HCC)    Chest/aorta CTA 04/2022: 14 x 8 focal saccular aneurysm versus penetrating atherosclerotic ulcer  involving left side of proximal portion of transverse aortic arch; descending thoracic aorta and proximal abdominal aorta aneurysm 4.4 cm; aortic atherosclerosis; emphysema   Tobacco abuse    Wears dentures    Wears glasses     Past Surgical History:  Procedure Laterality Date   ABDOMINAL AORTOGRAM W/LOWER EXTREMITY N/A 03/09/2020   Procedure: ABDOMINAL AORTOGRAM W/LOWER EXTREMITY;  Surgeon: [name], MD;  Location: [place] [facility];  Service: Cardiovascular;  Laterality: N/A;   ABDOMINAL AORTOGRAM W/LOWER EXTREMITY N/A 07/26/2022   Procedure: ABDOMINAL AORTOGRAM W/LOWER EXTREMITY;  Surgeon: [name], MD;  Location: [place] [facility];  Service: Cardiovascular;  Laterality: N/A;   ABDOMINAL HYSTERECTOMY     BREAST SURGERY     left cyst excision x 2   CARDIAC CATHETERIZATION     several years ago, nonobstructive, "50-60% blockages"   ENDARTERECTOMY FEMORAL Right 03/18/2020   Procedure: ENDARTERECTOMY FEMORAL WITH PROFUNDAPLASTY;  Surgeon: [name], MD;  Location: [place];  Service: Vascular;  Laterality: Right;   FEMORAL-POPLITEAL BYPASS GRAFT Right 03/18/2020   Procedure: BYPASS GRAFT FEMORAL-POPLITEAL ARTERY using GORE PROPATEN VASCULAR GRAFT;  Surgeon: [name], MD;  Location: [place];  Service: Vascular;  Laterality: Right;   HIP SURGERY     left - bars and screws placed   [place] Right 03/18/2020   Procedure: INSERTION OF GORE VIABAHN  ILIAC STENT;  Surgeon: [name], MD;  Location: [place];  Service: Vascular;  Laterality: Right;   [place] N/A 07/17/2013   Procedure: LEFT HEART CATHETERIZATION WITH CORONARY ANGIOGRAM;  Surgeon: Larey Dresser, MD;  Location: [facility] C[facility];  Service: Cardiovascular;  Laterality: N/A;   LOWER EXTREMITY ANGIOGRAM Right 03/18/2020   Procedure: RIGHT ILIAC ARTERIOGRAM WITH AN ILIAC STENT;  Surgeon: [name], MD;  Location:  [place];  Service: Vascular;  Laterality: Right;   MULTIPLE TOOTH EXTRACTIONS     PATCH ANGIOPLASTY Right 03/18/2020   Procedure: PATCH ANGIOPLASTY using a [place] OF THE PROFUNDA;  Surgeon: [name], MD;  Location: [place];  Service: Vascular;  Laterality: Right;   PERIPHERAL VASCULAR CATHETERIZATION N/A 07/16/2016   Procedure: Abdominal Aortogram w/Lower Extremity;  Surgeon: Rosetta Posner, MD;  Location: [place] CV [facility];  Service: Cardiovascular;  Laterality: N/A;   PERIPHERAL VASCULAR INTERVENTION Left 03/09/2020   Procedure: PERIPHERAL VASCULAR INTERVENTION;  Surgeon: [name], MD;  Location: [place] [facility];  Service: Cardiovascular;  Laterality: Left;  common and external illiac    ROTATOR CUFF REPAIR     right side, had torn ligaments as well   TUBAL LIGATION     VEIN BYPASS SURGERY     left leg   WRIST ARTHROCENTESIS     left - broke in 3 places so has bars and screws  placed    Current Medications: Current Meds  Medication Sig   albuterol (PROVENTIL HFA;VENTOLIN HFA) 108 (90 BASE) MCG/ACT inhaler Inhale 2 puffs into the lungs every 6 (six) hours as needed for wheezing or shortness of breath.   albuterol (PROVENTIL) (2.5 MG/3ML) 0.083% nebulizer solution Take 2.5 mg by nebulization every 6 (six) hours as needed for wheezing or shortness of breath.   alendronate (FOSAMAX) 70 MG tablet Take 70 mg by mouth every Monday.   allopurinol (ZYLOPRIM) 300 MG tablet Take 300 mg by mouth daily.    aspirin EC 81 MG tablet Take 81 mg by mouth daily.    atorvastatin (LIPITOR) 80 MG tablet Take 80 mg by mouth at bedtime.   Cholecalciferol (VITAMIN D) 125 MCG (5000 UT) CAPS Take 5,000 Units by mouth daily.   clopidogrel (PLAVIX) 75 MG tablet Take 1 tablet (75 mg total) by mouth daily.   cyanocobalamin (VITAMIN B12) 1000 MCG tablet Take 1,000 mcg by mouth daily.   diphenhydrAMINE (BENADRYL) 25 MG tablet Take 50 mg by mouth at bedtime.   EPIPEN 2-PAK 0.3  MG/0.3ML SOAJ injection Inject 0.3 mg into the muscle daily as needed for anaphylaxis. Use as directed as needed for allergies.   ezetimibe (ZETIA) 10 MG tablet TAKE 1 TABLET BY MOUTH EVERY DAY   FLUAD QUADRIVALENT 0.5 ML injection    furosemide (LASIX) 40 MG tablet Take 1 tablet (40 mg total) by mouth daily. For 5 days only then repeat labs.   lactulose (CHRONULAC) 10 GM/15ML solution Take 20 g by mouth 2 (two) times daily.   Menthol-Methyl Salicylate (MUSCLE RUB) 10-15 % CREA Apply 1 application topically daily as needed (for arthritis pain).   nitroGLYCERIN (NITROSTAT) 0.4 MG SL tablet Place 1 tablet (0.4 mg total) under the tongue every 5 (five) minutes x 3 doses as needed for chest pain.   Oxycodone HCl 10 MG TABS Take 1 tablet (10 mg total) by mouth 4 (four) times daily as needed (for pain).   pantoprazole (PROTONIX) 40 MG tablet Take 40 mg by mouth 2 (two) times daily.   potassium chloride SA (KLOR-CON M20) 20 MEQ tablet Take 1 tablet (20 mEq total) by mouth daily.   senna-docusate (SENOKOT-S) 8.6-50 MG tablet Take 2 tablets by mouth at bedtime.   spironolactone (ALDACTONE) 25 MG tablet Take 1 tablet (25 mg total) by mouth daily.   verapamil (CALAN-SR) 240 MG CR tablet TAKE 1 TABLET BY MOUTH EVERYDAY AT BEDTIME     Allergies:   Bee venom, Insect extract, Dapagliflozin, Gabapentin, Nsaids, Pregabalin, and Latex   Social History   Socioeconomic History   Marital status: Married    Spouse name: Not on file   Number of children: Not on file   Years of education: Not on file   Highest education level: Not on file  Occupational History   Not on file  Tobacco Use   Smoking status: Every Day    Years: 50.00    Types: Cigarettes   Smokeless tobacco: Never  Vaping Use   Vaping Use: Never used  Substance and Sexual Activity   Alcohol use: Never   Drug use: Never   Sexual activity: Not on file  Other Topics Concern   Not on file  Social History Narrative   ** Merged History  Encounter **       Pt lives in [place] with husband.  Retired Educational psychologist   Social Determinants of Radio broadcast assistant Strain: Not on [employer]  Insecurity: Not on file  Transportation Needs: Not on file  Physical Activity: Not on file  Stress: Not on file  Social Connections: Not on file     Family History: The patient's family history includes Breast cancer in her maternal grandmother; Cervical cancer in her mother; Colon cancer in her maternal aunt; Heart disease in her father and mother; Liver cancer in her father; Lung cancer in her maternal aunt, maternal aunt, maternal aunt, and maternal uncle; Prostate cancer in her maternal uncle; Stomach cancer in her maternal aunt.  ROS:   Please see the history of present illness.    Review of Systems  Constitutional:  Negative for chills and fever.  Respiratory:  Positive for shortness of breath.   Cardiovascular:  Positive for claudication and leg swelling. Negative for chest pain, palpitations, orthopnea and PND.  Gastrointestinal:  Positive for heartburn. Negative for nausea and vomiting.  Genitourinary:  Negative for dysuria.  Musculoskeletal:  Positive for joint pain.  Neurological:  Negative for dizziness and loss of consciousness.  Endo/Heme/Allergies:  Positive for environmental allergies. Negative for polydipsia.  Psychiatric/Behavioral:  Negative for depression.     EKGs/Labs/Other Studies Reviewed:    The following studies were reviewed today: CTA Aorta 04/2022: IMPRESSION: Grossly stable 14 x 8 mm focal saccular aneurysm or penetrating atherosclerotic ulcer is seen involving the left side of the proximal portion of the transverse aortic arch.   Also noted is stable 4.4 cm focal fusiform aneurysm involving the distal descending thoracic aorta and proximal abdominal aorta. Recommend annual imaging followup by CTA or MRA. This recommendation follows 2010 ACCF/AHA/AATS/ACR/ASA/SCA/SCAI/SIR/STS/SVM  Guidelines for the Diagnosis and Management of Patients with Thoracic Aortic Disease. Circulation. 2010; 121: J497-W263. Aortic aneurysm NOS (ICD10-I71.9).   Mild bronchial wall thickening is noted in bilateral lower lobe bronchi concerning for bronchitis.   Aortic Atherosclerosis (ICD10-I70.0) and Emphysema (ICD10-J43.9).  ABI Doppler 07/18/2021: Bilateral ABIs appear essentially unchanged compared to prior study on  01/17/2021.     Summary:  Right: Resting right ankle-brachial index is within normal range. No  evidence of significant right lower extremity arterial disease. The right  toe-brachial index is abnormal.   Left: Resting left ankle-brachial index indicates mild left lower  extremity arterial disease. The left toe-brachial index is abnormal.  LE Arterial Doppler 07/18/2021: Summary:  Right: Patent right fem-pop bypass graft with no evidence of stenosis.  Occluded right posterior tibial artery.   Abdominal Aorta Studay 07/18/2021: Summary:  Stenosis: +-------------------+-----------+  Location           Stent        +-------------------+-----------+  Right Common Iliac no stenosis  +-------------------+-----------+  Left Common Iliac  no stenosis  +-------------------+-----------+  Left External Iliacno stenosis  +-------------------+-----------+   Bilateral Carotid Doppler 05/11/2021: Summary:  Right Carotid: Velocities in the right ICA are consistent with a 1-39%  stenosis.                 Non-hemodynamically significant plaque <50% noted in the  CCA. The                 ECA appears >50% stenosed.   Left Carotid: Velocities in the left ICA are consistent with a 1-39%  stenosis.                Non-hemodynamically significant plaque <50% noted in the  CCA. The                ECA appears >50% stenosed.   Vertebrals:  Bilateral vertebral arteries demonstrate antegrade flow.  Subclavians: Right subclavian artery flow was disturbed. Normal flow                hemodynamics were seen in the left subclavian artery.   Incidental findings: Complex lesion with increase vascularity noted in  the mid pole of the right thyroid, measuring 1.6 x 1.3 x 1.9 cm.   LONG TERM MONITOR (3-7 DAYS) INTERPRETATION 02/28/2021 Narrative  Patch wear time was 3 days and 4 hours.  Predominant rhythm was NSR with average HR 92 (ranging from 59-150bpm)  There were 3 runs of SVT with the longest lasting 7 beats at average rate of 126bpm; fastest was 7 beats at rate 150bpm  Patient triggered events mainly correlated with sinus tachycardia, PACs and PVCs  Rare SVEs (<1%) and rare PVCs (<1%)   Chest CT 02/17/21 4.4 cm distal descending thoracic aortic aneurysm.  Aortic Atherosclerosis    GATED SPECT MYO PERF W/LEXISCAN STRESS 1D 02/17/2021 1.  Normal MPI study without evidence of ischemia or infarction. 2.  Normal left ventricular function, EF> 65%. 3.  This is a low risk study.  Echocardiogram 03/10/20 EF 55-60, inf HK, mild LVH, normal RVSF, trivial MR, AV sclerosis (no AS)   Echocardiogram 06/23/2019 EF 60-65, impaired relaxation, mild MAC, mild MR, trivial AI, mild to moderate aortic sclerosis, no aortic stenosis, mild LAE, RVSP 30.7   Myoview 04/22/2019 EF 70, normal perfusion, low risk   Dobutamine stress echocardiogram 03/22/2016 Normal dobutamine echo . Ambiant PVCls at rest and with infusion   Echocardiogram 03/22/2016 Mild LVH, EF 60-65, normal wall motion, grade 1 diastolic dysfunction, aortic sclerosis, trivial MR   Holter monitor 02/23/2016 Very frequent PVCs in pattern of bigeminy and trigeminy, total 41,000 PVCs in 48 hours.   Echocardiogram 07/18/2013 Mild to moderate LVH, EF 60, normal wall motion, mild LAE, normal RV SF   Cardiac catheterization 07/17/2013 LAD proximal 30-40 LCx ostial 40; OM1 30 ostial RCA proximal 30, mid 30   Myoview 07/16/2013 Normal stress nuclear study.  LV Ejection Fraction: 73%    EKG:  EKG is personally  reviewed. 01/26/2022:  NSR, first degree AVB, HR 82   Recent Labs: 10/30/2021: NT-Pro BNP 239 07/27/2022: BUN 15; Creatinine, Ser 0.65; Hemoglobin 12.7; Platelets 209; Potassium 3.3; Sodium 134   Recent Lipid Panel    Component Value Date/Time   CHOL 125 02/02/2021 0947   TRIG 66 02/02/2021 0947   HDL 49 02/02/2021 0947   CHOLHDL 2.6 02/02/2021 0947   CHOLHDL 3.2 03/19/2020 1234   VLDL 22 03/19/2020 1234   LDLCALC 62 02/02/2021 0947     Physical Exam:    VS:  BP 130/62   Pulse 77   Ht 5' 7.5" (1.715 m)   Wt 126 lb 9.6 oz (57.4 kg)   SpO2 91%   BMI 19.54 kg/m     Wt Readings from Last 3 Encounters:  08/16/22 126 lb 9.6 oz (57.4 kg)  07/26/22 126 lb (57.2 kg)  07/24/22 126 lb (57.2 kg)     GEN:  Comfortable, NAD HEENT: Normal NECK: No JVD; No carotid bruits CARDIAC: RRR, 1/6 systolic murmur. No rubs, gallops RESPIRATORY:  Diminished but clear ABDOMEN: Soft, non-tender, non-distended MUSCULOSKELETAL: + L>R bilateral edema with erythema. Chronic venous stasis changes  SKIN: Warm and dry NEUROLOGIC:  Alert and oriented x 3 PSYCHIATRIC:  Normal affect   ASSESSMENT:    1. Coronary artery disease involving native coronary artery of native heart without angina pectoris  2. Chronic heart failure with preserved ejection fraction ([place])   3. PVD (peripheral vascular disease) ([place])   4. Aneurysm of ascending aorta without rupture ([place])   5. Tobacco abuse   6. Essential hypertension   7. PAD (peripheral artery disease) (HCC)     PLAN:    In order of problems listed above:  #Chronic diastolic heart failure: TTE 03/10/20 with LVEF 55-60%, mild LVH, aortic sclerosis, normal RAP. Currently, stable with NYHA class II-III symptoms. Euvolemic on exam.  -Continue lasix 49m daily -Did not tolerate farixga -Continue spironolactone 271mdaily -Low Na diet  #Nonobstructive CAD: Nonobstructive coronary disease by cardiac catheterization in 2014.  Myoview in 6/22 was low  risk. No anginal symptoms. -Continue ASA 8139maily -Continue lipitor 11m29mily -Continue zetia 10mg52mly  #PVCs: <1% burden on monitor. On verapamil. -Continue verapamil 240mg 63my  #PAD: S/p left femoral-popliteal bypass percutaneous mechanical thrombectomy and Angioplasty and stent of left common femoral to above-knee popliteal bypass into the native above-knee popliteal artery (6 mm x 25 cm Viabahn and 6 mm x 15 cm Viabahn all postdilated with a 5 mm Sterling) with Dr. Clark Carlis Abbott/9/23.  -Management per vascular surgery -Continue ASA 81mg d33m -Continue lipitor 11mg da42m-Continue zetia 10mg dai50m#Thoracic Aortic Aneurysm: #AAA: CTA aorta 04/2022 showed 4 x 8 mm focal saccular aneurysm or penetrating atherosclerotic ulcer is seen involving the left side of the proximal portion of the transverse aortic arch and stable 4.4cm focal fusiform descending aortic aneurysm. Followed by Vascular surgery and CT surgery. -Continue ASA 81mg dail3montinue lipitor 11mg daily57mntinue zetia 10mg daily 34mnned for CTA on 04/2023  #HTN: Fairly well controlled at 130s at home. -Continue verapamil 240mg daily -85minue spironolactone 25mg daily -C61mdjust if BP persistently >130/90 at home  #HLD: Well controlled and at goal -Continue lipitor 11mg daily -Co49mue zetia 10mg daily -LDL19m09/2023  #Tobacco Abuse: Over 50 pack year smoking history with current tobacco use.  -Will continue with yearly CT screenings  #Mild Carotid Artery Disease: RICA and LICA 1-39% in 08/20225-27%l pl78/2423repeat 04/2023 -Continue ASA 81mg daily -Cont32m lipitor 11mg daily -Conti72mzetia 10mg daily   Follo9m:  6months  Medication23monthments/Labs and Tests Ordered: Current medicines are reviewed at length with the patient today.  Concerns regarding medicines are outlined above.   No orders of the defined types were placed in this encounter.  No orders of the defined types were  placed in this encounter.  Patient Instructions  Medication Instructions:  NO CHANGES *If you need a refill on your cardiac medications before your next appointment, please call your pharmacy*   Lab Work: NONE If you have labs (blood work) drawn today and your tests are completely normal, you will receive your results only by: MyChart Message (if [place]) OR A paper copy in the mail If you have any lab test that is abnormal or we need to change your treatment, we will call you to review the results.   Testing/Procedures: NONE   Follow-Up: At [facility].  As part of our continuing mission to provide you with exceptional heart care, we have created designated Provider Care Teams.  These Care Teams include your primary Cardiologist (physician) and Advanced Practice Providers (APPs -  Physician Assistants and Nurse Practitioners) who all work together to provide you with the care you need, when you need it.  We recommend signing up for  the patient portal called "MyChart".  Sign up information is provided on this After Visit Summary.  MyChart is used to connect with patients for Virtual Visits (Telemedicine).  Patients are able to view lab/test results, encounter notes, upcoming appointments, etc.  Non-urgent messages can be sent to your provider as well.   To learn more about what you can do with MyChart, go to [email or web address].    Your next appointment:   6 month(s)  The format for your next appointment:   In Person  Provider:   Freada Bergeron, MD     Other Instructions NONE  Important Information About Sugar          Signed, Freada Bergeron, MD  08/16/2022 9:05 AM    [place]

## 2022-08-16 ENCOUNTER — Encounter: Payer: Self-pay | Admitting: Cardiology

## 2022-08-16 ENCOUNTER — Ambulatory Visit: Payer: Medicare Other | Attending: Cardiology | Admitting: Cardiology

## 2022-08-16 VITALS — BP 130/62 | HR 77 | Ht 67.5 in | Wt 126.6 lb

## 2022-08-16 DIAGNOSIS — I10 Essential (primary) hypertension: Secondary | ICD-10-CM | POA: Diagnosis not present

## 2022-08-16 DIAGNOSIS — I739 Peripheral vascular disease, unspecified: Secondary | ICD-10-CM

## 2022-08-16 DIAGNOSIS — I5032 Chronic diastolic (congestive) heart failure: Secondary | ICD-10-CM

## 2022-08-16 DIAGNOSIS — I2510 Atherosclerotic heart disease of native coronary artery without angina pectoris: Secondary | ICD-10-CM | POA: Diagnosis not present

## 2022-08-16 DIAGNOSIS — I7121 Aneurysm of the ascending aorta, without rupture: Secondary | ICD-10-CM

## 2022-08-16 DIAGNOSIS — Z720 Tobacco use: Secondary | ICD-10-CM

## 2022-08-16 NOTE — Patient Instructions (Signed)
Medication Instructions:  NO CHANGES *If you need a refill on your cardiac medications before your next appointment, please call your pharmacy*   Lab Work: NONE If you have labs (blood work) drawn today and your tests are completely normal, you will receive your results only by: [place] (if you have MyChart) OR A paper copy in the mail If you have any lab test that is abnormal or we need to change your treatment, we will call you to review the results.   Testing/Procedures: NONE   Follow-Up: At [facility], you and your health needs are our priority.  As part of our continuing mission to provide you with exceptional heart care, we have created designated Provider Care Teams.  These Care Teams include your primary Cardiologist (physician) and Advanced Practice Providers (APPs -  Physician Assistants and Nurse Practitioners) who all work together to provide you with the care you need, when you need it.  We recommend signing up for the patient portal called "MyChart".  Sign up information is provided on this After Visit Summary.  MyChart is used to connect with patients for Virtual Visits (Telemedicine).  Patients are able to view lab/test results, encounter notes, upcoming appointments, etc.  Non-urgent messages can be sent to your provider as well.   To learn more about what you can do with MyChart, go to [email or web address].    Your next appointment:   6 month(s)  The format for your next appointment:   In Person  Provider:   Freada Bergeron, MD     Other Instructions NONE  Important Information About Sugar

## 2022-08-20 DIAGNOSIS — Z79891 Long term (current) use of opiate analgesic: Secondary | ICD-10-CM | POA: Diagnosis not present

## 2022-08-20 DIAGNOSIS — M25552 Pain in left hip: Secondary | ICD-10-CM | POA: Diagnosis not present

## 2022-08-20 DIAGNOSIS — G8929 Other chronic pain: Secondary | ICD-10-CM | POA: Diagnosis not present

## 2022-08-20 DIAGNOSIS — M25551 Pain in right hip: Secondary | ICD-10-CM | POA: Diagnosis not present

## 2022-08-20 DIAGNOSIS — R030 Elevated blood-pressure reading, without diagnosis of hypertension: Secondary | ICD-10-CM | POA: Diagnosis not present

## 2022-08-20 DIAGNOSIS — Z6820 Body mass index (BMI) 20.0-20.9, adult: Secondary | ICD-10-CM | POA: Diagnosis not present

## 2022-08-20 DIAGNOSIS — Z79899 Other long term (current) drug therapy: Secondary | ICD-10-CM | POA: Diagnosis not present

## 2022-08-20 DIAGNOSIS — Z681 Body mass index (BMI) 19 or less, adult: Secondary | ICD-10-CM | POA: Diagnosis not present

## 2022-08-20 DIAGNOSIS — I739 Peripheral vascular disease, unspecified: Secondary | ICD-10-CM | POA: Diagnosis not present

## 2022-08-21 ENCOUNTER — Other Ambulatory Visit: Payer: Self-pay | Admitting: *Deleted

## 2022-08-21 DIAGNOSIS — I739 Peripheral vascular disease, unspecified: Secondary | ICD-10-CM

## 2022-08-28 ENCOUNTER — Ambulatory Visit (HOSPITAL_COMMUNITY)
Admission: RE | Admit: 2022-08-28 | Discharge: 2022-08-28 | Disposition: A | Discharge status: Home / Self Care | Payer: Medicare Other | Source: Ambulatory Visit | Attending: Vascular Surgery | Admitting: Vascular Surgery

## 2022-08-28 ENCOUNTER — Ambulatory Visit (INDEPENDENT_AMBULATORY_CARE_PROVIDER_SITE_OTHER)
Admission: RE | Admit: 2022-08-28 | Discharge: 2022-08-28 | Disposition: A | Discharge status: Home / Self Care | Payer: Medicare Other | Source: Ambulatory Visit | Attending: Vascular Surgery | Admitting: Vascular Surgery

## 2022-08-28 ENCOUNTER — Encounter: Payer: Self-pay | Admitting: Vascular Surgery

## 2022-08-28 ENCOUNTER — Ambulatory Visit (INDEPENDENT_AMBULATORY_CARE_PROVIDER_SITE_OTHER): Payer: Medicare Other | Admitting: Vascular Surgery

## 2022-08-28 VITALS — BP 168/74 | HR 81 | Temp 97.4°F | Resp 14 | Ht 68.0 in | Wt 122.0 lb

## 2022-08-28 DIAGNOSIS — I70222 Atherosclerosis of native arteries of extremities with rest pain, left leg: Secondary | ICD-10-CM

## 2022-08-28 DIAGNOSIS — I739 Peripheral vascular disease, unspecified: Secondary | ICD-10-CM

## 2022-08-28 NOTE — Progress Notes (Signed)
Patient name: Marie Johnson MRN: [id number] DOB: 09-19-1950 Sex: female  REASON FOR VISIT: Hospital follow-up   HPI: Marie Johnson is a 71 y.o. female with multiple comorbidities as listed below including CAD, COPD, HTN, HLD, PAD and tobacco abuse the presents for hospital follow-up.  She most recently on 07/26/2022 underwent recanalization of occluded left common femoral to above-knee popliteal bypass with restenting in the setting of critical limb ischemia with tissue loss.  Patient states her left foot wound is healing.  Foot feels a lot better.  She has somewhat complex history.  She initially had a left common femoral to above-knee popliteal bypass by Dr. Donnetta Hutching about 20 years ago.  I initially saw her with bilateral lower extremity critical limb ischemia.  On 03/16/2020 she underwent left common and external iliac artery angioplasty with stent placement with a patent bypass that Dr. Donnetta Hutching had previously performed.  At a later date on 03/18/2020, I performed right common femoral endarterectomy with retrograde right common iliac artery stenting and then a right common femoral to below knee pop bypass with PTFE.  Also seen for descending thoracic aneurysm. 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  Past Medical History:  Diagnosis Date   AAA (abdominal aortic aneurysm) (HCC)    3.4 cm   Anemia    Anxiety    Aortic atherosclerosis (HCC)    Arthritis    Asthma    CAD (coronary artery disease)    a. Nonobstructive CAD by cath 07/17/13.   Carotid artery disease ([place])    Carotid US 8/22: Bilateral ICA 1-39; right subclavian stenosis; right thyroid nodule   CHD (congenital heart disease)    pt unaware???   Chest pain    a. Adm  10-07/2013 - CTA neg for PE/dissection, cath with nonobstructive disease, CP ?due to uncontrolled HTN vs coronary vasospasm.   CHF (congestive heart failure) (HCC)    COPD (chronic obstructive pulmonary disease) (HCC)    Coronary artery disease    Echocardiogram    Echocardiogram 06/2019: EF 60-65, impaired relaxation (Gr 1 DD), mild MAC, mild MR, mild to mod aortic valve sclerosis (no AS), trivial TR, mild LAE, normal RVSF, RVSP 30.7 (mildly elevated)   GERD (gastroesophageal reflux disease)    Hyperlipidemia    Hypertension    Neuropathy    OA (osteoarthritis)    Osteoporosis    PAD (peripheral artery disease) (HCC)    a. s/p L fem-pop bypass b. recent evaluation at [facility], unamenable to intervention   PAD (peripheral artery disease) ([place])    Palpitations 02/22/2016   Pneumonia    Pre-diabetes    Premature ventricular contraction    PUD (peptic ulcer disease)    PVD (peripheral vascular disease) ([place]) 07/17/2013   Thoracic aortic aneurysm (HCC)    Chest/aorta CTA 04/2022: 14 x 8 focal saccular aneurysm versus penetrating atherosclerotic ulcer involving left side of proximal portion of transverse aortic arch; descending thoracic aorta and proximal abdominal aorta aneurysm 4.4 cm; aortic atherosclerosis; emphysema   Tobacco abuse    Wears dentures    Wears glasses     Past Surgical History:  Procedure Laterality Date   ABDOMINAL AORTOGRAM W/LOWER EXTREMITY N/A 03/09/2020  Procedure: ABDOMINAL AORTOGRAM W/LOWER EXTREMITY;  Surgeon: [name], MD;  Location: [place] [facility];  Service: Cardiovascular;  Laterality: N/A;   ABDOMINAL AORTOGRAM W/LOWER EXTREMITY N/A 07/26/2022   Procedure: ABDOMINAL AORTOGRAM W/LOWER EXTREMITY;  Surgeon: [name], MD;  Location: [place] [facility];  Service: Cardiovascular;  Laterality: N/A;   ABDOMINAL HYSTERECTOMY     BREAST SURGERY     left cyst excision x 2   CARDIAC CATHETERIZATION     several years ago, nonobstructive,  "50-60% blockages"   ENDARTERECTOMY FEMORAL Right 03/18/2020   Procedure: ENDARTERECTOMY FEMORAL WITH PROFUNDAPLASTY;  Surgeon: [name], MD;  Location: [place];  Service: Vascular;  Laterality: Right;   FEMORAL-POPLITEAL BYPASS GRAFT Right 03/18/2020   Procedure: BYPASS GRAFT FEMORAL-POPLITEAL ARTERY using GORE PROPATEN VASCULAR GRAFT;  Surgeon: [name], MD;  Location: [place];  Service: Vascular;  Laterality: Right;   HIP SURGERY     left - bars and screws placed   [place] Right 03/18/2020   Procedure: INSERTION OF GORE VIABAHN  ILIAC STENT;  Surgeon: [name], MD;  Location: [place];  Service: Vascular;  Laterality: Right;   [place] N/A 07/17/2013   Procedure: LEFT HEART CATHETERIZATION WITH CORONARY ANGIOGRAM;  Surgeon: Larey Dresser, MD;  Location: [facility];  Service: Cardiovascular;  Laterality: N/A;   LOWER EXTREMITY ANGIOGRAM Right 03/18/2020   Procedure: RIGHT ILIAC ARTERIOGRAM WITH AN ILIAC STENT;  Surgeon: [name], MD;  Location: [place];  Service: Vascular;  Laterality: Right;   MULTIPLE TOOTH EXTRACTIONS     PATCH ANGIOPLASTY Right 03/18/2020   Procedure: PATCH ANGIOPLASTY using a [place] OF THE PROFUNDA;  Surgeon: [name], MD;  Location: [place];  Service: Vascular;  Laterality: Right;   PERIPHERAL VASCULAR CATHETERIZATION N/A 07/16/2016   Procedure: Abdominal Aortogram w/Lower Extremity;  Surgeon: Rosetta Posner, MD;  Location: [place] [facility];  Service: Cardiovascular;  Laterality: N/A;   PERIPHERAL VASCULAR INTERVENTION Left 03/09/2020   Procedure: PERIPHERAL VASCULAR INTERVENTION;  Surgeon: [name], MD;  Location: [place] [facility];  Service: Cardiovascular;  Laterality: Left;  common and external illiac    ROTATOR CUFF REPAIR     right side, had torn ligaments as well   TUBAL LIGATION     VEIN BYPASS SURGERY     left leg    WRIST ARTHROCENTESIS     left - broke in 3 places so has bars and screws placed    Family History  Problem Relation Age of Onset   Cervical cancer Mother    Heart disease Mother        MI in 16s   Liver cancer Father    Heart disease Father    Lung cancer Maternal Aunt    Prostate cancer Maternal Uncle        met. anal   Breast cancer Maternal Grandmother    Lung cancer Maternal Aunt    Lung cancer Maternal Aunt    Stomach cancer Maternal Aunt    Colon cancer Maternal Aunt    Lung cancer Maternal Uncle     SOCIAL HISTORY: Social History   Tobacco Use   Smoking status: Every Day    Years: 50.00    Types: Cigarettes   Smokeless tobacco: Never  Substance Use Topics   Alcohol use: Never    Allergies  Allergen Reactions  Bee Venom Anaphylaxis and Other (See Comments)    Welts, also    Insect bites- Welts, also   Insect Extract Anaphylaxis and Other (See Comments)    Insect bites- Welts, also   Dapagliflozin Other (See Comments)   Gabapentin Other (See Comments)   Nsaids Nausea Only and Other (See Comments)    Patient is not suppose to have this class of medication (liver)   Pregabalin Other (See Comments)   Latex Rash and Other (See Comments)    Current Outpatient Medications  Medication Sig Dispense Refill   albuterol (PROVENTIL HFA;VENTOLIN HFA) 108 (90 BASE) MCG/ACT inhaler Inhale 2 puffs into the lungs every 6 (six) hours as needed for wheezing or shortness of breath.     albuterol (PROVENTIL) (2.5 MG/3ML) 0.083% nebulizer solution Take 2.5 mg by nebulization every 6 (six) hours as needed for wheezing or shortness of breath.     alendronate (FOSAMAX) 70 MG tablet Take 70 mg by mouth every Monday.     allopurinol (ZYLOPRIM) 300 MG tablet Take 300 mg by mouth daily.      aspirin EC 81 MG tablet Take 81 mg by mouth daily.      atorvastatin (LIPITOR) 80 MG tablet Take 80 mg by mouth at bedtime.     calcium carbonate (ANTACID) 500 MG chewable tablet TAKE 1  TABLET BY MOUTH TWICE A DAY FOR 30 DAYS     Cholecalciferol (VITAMIN D) 125 MCG (5000 UT) CAPS Take 5,000 Units by mouth daily.     clopidogrel (PLAVIX) 75 MG tablet Take 1 tablet (75 mg total) by mouth daily. 30 tablet 11   cyanocobalamin (VITAMIN B12) 1000 MCG tablet Take 1,000 mcg by mouth daily.     diphenhydrAMINE (BENADRYL) 25 MG tablet Take 50 mg by mouth at bedtime.     EPIPEN 2-PAK 0.3 MG/0.3ML SOAJ injection Inject 0.3 mg into the muscle daily as needed for anaphylaxis. Use as directed as needed for allergies.     ezetimibe (ZETIA) 10 MG tablet TAKE 1 TABLET BY MOUTH EVERY DAY 90 tablet 2   FLUAD QUADRIVALENT 0.5 ML injection      furosemide (LASIX) 40 MG tablet Take 1 tablet (40 mg total) by mouth daily. For 5 days only then repeat labs. 30 tablet 0   lactulose (CHRONULAC) 10 GM/15ML solution Take 20 g by mouth 2 (two) times daily.     Menthol-Methyl Salicylate (MUSCLE RUB) 10-15 % CREA Apply 1 application topically daily as needed (for arthritis pain).     nitroGLYCERIN (NITROSTAT) 0.4 MG SL tablet Place 1 tablet (0.4 mg total) under the tongue every 5 (five) minutes x 3 doses as needed for chest pain. 25 tablet 4   Oxycodone HCl 10 MG TABS Take 1 tablet (10 mg total) by mouth 4 (four) times daily as needed (for pain). 20 tablet 0   pantoprazole (PROTONIX) 40 MG tablet Take 40 mg by mouth 2 (two) times daily.     potassium chloride SA (KLOR-CON M20) 20 MEQ tablet Take 1 tablet (20 mEq total) by mouth daily. 90 tablet 1   senna-docusate (SENOKOT-S) 8.6-50 MG tablet Take 2 tablets by mouth at bedtime.     spironolactone (ALDACTONE) 25 MG tablet Take 1 tablet (25 mg total) by mouth daily. 90 tablet 3   verapamil (CALAN-SR) 240 MG CR tablet TAKE 1 TABLET BY MOUTH EVERYDAY AT BEDTIME 90 tablet 2   No current facility-administered medications for this visit.   Facility-Administered Medications Ordered in Other Visits  Medication Dose Route Frequency Provider Last Rate Last Admin    DOBUTamine (DOBUTREX) 1,000 mcg/mL in dextrose 5% 250 mL infusion  30 mcg/kg/min Intravenous Titrated Dorothy Spark, MD        REVIEW OF SYSTEMS:  _0  denotes positive finding, _1  denotes negative finding Cardiac  Comments:  Chest pain or chest pressure:    Shortness of breath upon exertion:    Short of breath when lying flat:    Irregular heart rhythm:        Vascular    Pain in calf, thigh, or hip brought on by ambulation:    Pain in feet at night that wakes you up from your sleep:     Blood clot in your veins:    Leg swelling:         Pulmonary    Oxygen at home:    Productive cough:     Wheezing:         Neurologic    Sudden weakness in arms or legs:     Sudden numbness in arms or legs:     Sudden onset of difficulty speaking or slurred speech:    Temporary loss of vision in one eye:     Problems with dizziness:         Gastrointestinal    Blood in stool:     Vomited blood:         Genitourinary    Burning when urinating:     Blood in urine:        Psychiatric    Major depression:         Hematologic    Bleeding problems:    Problems with blood clotting too easily:        Skin    Rashes or ulcers:        Constitutional    Fever or chills:      PHYSICAL EXAM: Vitals:   08/28/22 0857  BP: (!) 168/74  Pulse: 81  Resp: 14  Temp: (!) 97.4 F (36.3 C)  TempSrc: Temporal  SpO2: 94%  Weight: 122 lb (55.3 kg)  Height: _2  (1.727 m)    GENERAL: The patient is a well-nourished female, in no acute distress. The vital signs are documented above. CARDIAC: There is a regular rate and rhythm.  VASCULAR:  Palpable femoral pulses bilaterally Brisk left DP and PT signals Left shin wound healing PULMONARY: No respiratory distress. ABDOMEN: Soft and non-tender. MUSCULOSKELETAL: There are no major deformities or cyanosis. NEUROLOGIC: No focal weakness or paresthesias are detected.     DATA:   Left lower extremity arterial duplex shows a  high-grade stenosis in the proximal bypass graft just distal to the common femoral artery with a velocity of 322 suggesting greater than 70% stenosis.  ABI 1.04 biphasic and 1.06 monophasic   Assessment/Plan:  71 y.o. female presents for hospital follow-up.  She most recently underwent recanalization of an occluded left common femoral to above-knee popliteal bypass placed by Dr. Donnetta Hutching about 20 years ago.  Ultimately her duplex today shows that the Viabahn's placed in the left common femoral to above-knee popliteal bypass are patent although there appears to be a persistent high-grade stenosis in the proximal graft just distal to the common femoral artery.  I have recommended another aortogram, lower extremity arteriogram, with possible intervention given there is fairly sluggish velocities in the left leg bypass graft that would put her at high risk for early failure.  I also discussed the importance  of smoking cessation.  She will continue aspirin and Plavix.  Will get her scheduled after Christmas at her request.   [name], MD [facility]: [phone]

## 2022-09-03 ENCOUNTER — Other Ambulatory Visit: Payer: Self-pay

## 2022-09-03 DIAGNOSIS — I739 Peripheral vascular disease, unspecified: Secondary | ICD-10-CM

## 2022-09-03 DIAGNOSIS — I70222 Atherosclerosis of native arteries of extremities with rest pain, left leg: Secondary | ICD-10-CM

## 2022-09-07 ENCOUNTER — Other Ambulatory Visit: Payer: Self-pay

## 2022-09-07 ENCOUNTER — Telehealth: Payer: Self-pay

## 2022-09-07 DIAGNOSIS — Z0189 Encounter for other specified special examinations: Secondary | ICD-10-CM

## 2022-09-07 DIAGNOSIS — I5032 Chronic diastolic (congestive) heart failure: Secondary | ICD-10-CM

## 2022-09-07 DIAGNOSIS — Z79899 Other long term (current) drug therapy: Secondary | ICD-10-CM

## 2022-09-07 DIAGNOSIS — E871 Hypo-osmolality and hyponatremia: Secondary | ICD-10-CM

## 2022-09-07 MED ORDER — FUROSEMIDE 40 MG PO TABS: 40.0000 mg | ORAL_TABLET | Freq: Every day | ORAL | 1 refills | Status: DC

## 2022-09-07 NOTE — Telephone Encounter (Signed)
Rx sent to pharmacy   

## 2022-09-07 NOTE — Telephone Encounter (Signed)
Fax received from [facility] requesting refill on Furosemide '40mg'$ , 1 TAB BID. Per chart patient's last rx for Furosemide was 02/03/21, 1 TAB QD, #30, 0 refills. Based on this we have not prescribed this medication in >5yr Per lab result 02/08/21 patient was to continue current dose of '40mg'$  1 TAB QD.   Per patient she has been taking Furosemide daily, although her current bottle is dated 06/2021. Patient states she does not refill her old pill bottles with new pills. She is unsure the last time she picked this up from the pharmacy.  Per [facility] her last fill was #180 07/12/21.    Please advise. Thanks!

## 2022-09-12 ENCOUNTER — Other Ambulatory Visit: Payer: Self-pay

## 2022-09-12 DIAGNOSIS — I5032 Chronic diastolic (congestive) heart failure: Secondary | ICD-10-CM

## 2022-09-12 DIAGNOSIS — E871 Hypo-osmolality and hyponatremia: Secondary | ICD-10-CM

## 2022-09-12 DIAGNOSIS — Z79899 Other long term (current) drug therapy: Secondary | ICD-10-CM

## 2022-09-12 DIAGNOSIS — Z0189 Encounter for other specified special examinations: Secondary | ICD-10-CM

## 2022-09-12 MED ORDER — FUROSEMIDE 40 MG PO TABS: 40.0000 mg | ORAL_TABLET | Freq: Every day | ORAL | 3 refills | Status: DC

## 2022-09-20 ENCOUNTER — Ambulatory Visit (HOSPITAL_COMMUNITY)
Admission: RE | Admit: 2022-09-20 | Discharge: 2022-09-20 | Disposition: A | Discharge status: Home / Self Care | Payer: Medicare Other | Attending: Vascular Surgery | Admitting: Vascular Surgery

## 2022-09-20 ENCOUNTER — Other Ambulatory Visit: Payer: Self-pay

## 2022-09-20 ENCOUNTER — Encounter (HOSPITAL_COMMUNITY): Payer: Self-pay | Admitting: Vascular Surgery

## 2022-09-20 ENCOUNTER — Encounter (HOSPITAL_COMMUNITY)
Admission: RE | Disposition: A | Discharge status: Home / Self Care | Payer: Self-pay | Source: Home / Self Care | Attending: Vascular Surgery

## 2022-09-20 DIAGNOSIS — I10 Essential (primary) hypertension: Secondary | ICD-10-CM | POA: Diagnosis not present

## 2022-09-20 DIAGNOSIS — T82858A Stenosis of vascular prosthetic devices, implants and grafts, initial encounter: Secondary | ICD-10-CM | POA: Diagnosis not present

## 2022-09-20 DIAGNOSIS — J449 Chronic obstructive pulmonary disease, unspecified: Secondary | ICD-10-CM | POA: Diagnosis not present

## 2022-09-20 DIAGNOSIS — F17210 Nicotine dependence, cigarettes, uncomplicated: Secondary | ICD-10-CM | POA: Diagnosis not present

## 2022-09-20 DIAGNOSIS — I70222 Atherosclerosis of native arteries of extremities with rest pain, left leg: Secondary | ICD-10-CM

## 2022-09-20 DIAGNOSIS — L97529 Non-pressure chronic ulcer of other part of left foot with unspecified severity: Secondary | ICD-10-CM | POA: Insufficient documentation

## 2022-09-20 DIAGNOSIS — I739 Peripheral vascular disease, unspecified: Secondary | ICD-10-CM

## 2022-09-20 DIAGNOSIS — I70245 Atherosclerosis of native arteries of left leg with ulceration of other part of foot: Secondary | ICD-10-CM | POA: Diagnosis not present

## 2022-09-20 DIAGNOSIS — I2510 Atherosclerotic heart disease of native coronary artery without angina pectoris: Secondary | ICD-10-CM | POA: Diagnosis not present

## 2022-09-20 DIAGNOSIS — I714 Abdominal aortic aneurysm, without rupture, unspecified: Secondary | ICD-10-CM | POA: Insufficient documentation

## 2022-09-20 DIAGNOSIS — E785 Hyperlipidemia, unspecified: Secondary | ICD-10-CM | POA: Insufficient documentation

## 2022-09-20 DIAGNOSIS — I70402 Unspecified atherosclerosis of autologous vein bypass graft(s) of the extremities, left leg: Secondary | ICD-10-CM | POA: Diagnosis not present

## 2022-09-20 HISTORY — PX: ABDOMINAL AORTOGRAM W/LOWER EXTREMITY: CATH118223

## 2022-09-20 LAB — POCT I-STAT, CHEM 8
BUN: 16 mg/dL (ref 8–23)
Calcium, Ion: 1.17 mmol/L (ref 1.15–1.40)
Chloride: 101 mmol/L (ref 98–111)
Creatinine, Ser: 0.6 mg/dL (ref 0.44–1.00)
Glucose, Bld: 98 mg/dL (ref 70–99)
HCT: 41 % (ref 36.0–46.0)
Hemoglobin: 13.9 g/dL (ref 12.0–15.0)
Potassium: 3.3 mmol/L — ABNORMAL LOW (ref 3.5–5.1)
Sodium: 137 mmol/L (ref 135–145)
TCO2: 27 mmol/L (ref 22–32)

## 2022-09-20 SURGERY — ABDOMINAL AORTOGRAM W/LOWER EXTREMITY
Anesthesia: LOCAL

## 2022-09-20 MED ORDER — ASPIRIN 81 MG PO CHEW
CHEWABLE_TABLET | ORAL | Status: AC
  Filled 2022-09-20: qty 1

## 2022-09-20 MED ORDER — IODIXANOL 320 MG/ML IV SOLN
INTRAVENOUS | Status: DC | PRN
  Administered 2022-09-20: 65 mL via INTRA_ARTERIAL

## 2022-09-20 MED ORDER — LABETALOL HCL 5 MG/ML IV SOLN: 10.0000 mg | INTRAVENOUS | Status: DC | PRN

## 2022-09-20 MED ORDER — SODIUM CHLORIDE 0.9% FLUSH: 3.0000 mL | INTRAVENOUS | Status: DC | PRN

## 2022-09-20 MED ORDER — FENTANYL CITRATE (PF) 100 MCG/2ML IJ SOLN
INTRAMUSCULAR | Status: DC | PRN
  Administered 2022-09-20: 25 ug via INTRAVENOUS

## 2022-09-20 MED ORDER — MIDAZOLAM HCL 2 MG/2ML IJ SOLN
INTRAMUSCULAR | Status: DC | PRN
  Administered 2022-09-20: 1 mg via INTRAVENOUS

## 2022-09-20 MED ORDER — HEPARIN (PORCINE) IN NACL 1000-0.9 UT/500ML-% IV SOLN
INTRAVENOUS | Status: AC
  Filled 2022-09-20: qty 1000

## 2022-09-20 MED ORDER — HYDRALAZINE HCL 20 MG/ML IJ SOLN: 5.0000 mg | INTRAMUSCULAR | Status: DC | PRN

## 2022-09-20 MED ORDER — LIDOCAINE HCL (PF) 1 % IJ SOLN
INTRAMUSCULAR | Status: AC
  Filled 2022-09-20: qty 30

## 2022-09-20 MED ORDER — MIDAZOLAM HCL 2 MG/2ML IJ SOLN
INTRAMUSCULAR | Status: AC
  Filled 2022-09-20: qty 2

## 2022-09-20 MED ORDER — LIDOCAINE HCL (PF) 1 % IJ SOLN
INTRAMUSCULAR | Status: DC | PRN
  Administered 2022-09-20: 10 mL

## 2022-09-20 MED ORDER — SODIUM CHLORIDE 0.9 % IV SOLN: INTRAVENOUS | Status: DC

## 2022-09-20 MED ORDER — ONDANSETRON HCL 4 MG/2ML IJ SOLN: 4.0000 mg | Freq: Four times a day (QID) | INTRAMUSCULAR | Status: DC | PRN

## 2022-09-20 MED ORDER — ASPIRIN 81 MG PO CHEW
CHEWABLE_TABLET | ORAL | Status: DC | PRN
  Administered 2022-09-20: 81 mg via ORAL

## 2022-09-20 MED ORDER — FENTANYL CITRATE (PF) 100 MCG/2ML IJ SOLN
INTRAMUSCULAR | Status: AC
  Filled 2022-09-20: qty 2

## 2022-09-20 MED ORDER — ACETAMINOPHEN 325 MG PO TABS: 650.0000 mg | ORAL_TABLET | ORAL | Status: DC | PRN

## 2022-09-20 MED ORDER — SODIUM CHLORIDE 0.9 % IV SOLN: 250.0000 mL | INTRAVENOUS | Status: DC | PRN

## 2022-09-20 MED ORDER — HEPARIN (PORCINE) IN NACL 1000-0.9 UT/500ML-% IV SOLN
INTRAVENOUS | Status: DC | PRN
  Administered 2022-09-20 (×2): 500 mL

## 2022-09-20 MED ORDER — SODIUM CHLORIDE 0.9% FLUSH: 3.0000 mL | Freq: Two times a day (BID) | INTRAVENOUS | Status: DC

## 2022-09-20 MED ORDER — SODIUM CHLORIDE 0.9 % IV SOLN: INTRAVENOUS | Status: AC

## 2022-09-20 SURGICAL SUPPLY — 8 items
CATH OMNI FLUSH 5F 65CM (CATHETERS) IMPLANT
KIT MICROPUNCTURE NIT STIFF (SHEATH) IMPLANT
KIT PV (KITS) ×1 IMPLANT
SHEATH PINNACLE 5F 10CM (SHEATH) IMPLANT
SYR MEDRAD MARK V 150ML (SYRINGE) IMPLANT
TRANSDUCER W/STOPCOCK (MISCELLANEOUS) ×1 IMPLANT
TRAY PV CATH (CUSTOM PROCEDURE TRAY) ×1 IMPLANT
WIRE BENTSON .035X145CM (WIRE) IMPLANT

## 2022-09-20 NOTE — H&P (Signed)
History and Physical Interval Note:  11/22/8586 5:02 AM  Marie Johnson  has presented today for surgery, with the diagnosis of ischemia with wound left leg.  The various methods of treatment have been discussed with the patient and family. After consideration of risks, benefits and other options for treatment, the patient has consented to  Procedure(s): ABDOMINAL AORTOGRAM W/LOWER EXTREMITY (N/A) as a surgical intervention.  The patient's history has been reviewed, patient examined, no change in status, stable for surgery.  I have reviewed the patient's chart and labs.  Questions were answered to the patient's satisfaction.     [name]     Patient name: Marie Johnson MRN: [id number]        DOB: 09-17-1951          Sex: female   REASON FOR VISIT: Hospital follow-up    HPI: Marie Johnson is a 72 y.o. female with multiple comorbidities as listed below including CAD, COPD, HTN, HLD, PAD and tobacco abuse the presents for hospital follow-up.  She most recently on 07/26/2022 underwent recanalization of occluded left common femoral to above-knee popliteal bypass with restenting in the setting of critical limb ischemia with tissue loss.  Patient states her left foot wound is healing.  Foot feels a lot better.   She has somewhat complex history.  She initially had a left common femoral to above-knee popliteal bypass by Dr. Donnetta Hutching about 20 years ago.  I initially saw her with bilateral lower extremity critical limb ischemia.  On 03/16/2020 she underwent left common and external iliac artery angioplasty with stent placement with a patent bypass that Dr. Donnetta Hutching had previously performed.  At a later date on 03/18/2020, I performed right common femoral endarterectomy with retrograde right common iliac artery stenting and then a right common femoral to below knee pop bypass with PTFE.   Also seen for descending thoracic aneurysm. She had a CTA chest on 04/20/2022 with findings of a 14 x 8 mm focal saccular  aneurysm in the left side of the proximal transverse aortic arch and also a 4.4 cm fusiform aneurysm in the distal descending thoracic aorta both stable compared to a CT in 02/16/21.  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       Past Medical History:  Diagnosis Date   AAA (abdominal aortic aneurysm) (HCC)      3.4 cm   Anemia     Anxiety     Aortic atherosclerosis (HCC)     Arthritis     Asthma     CAD (coronary artery disease)      a. Nonobstructive CAD by cath 07/17/13.   Carotid artery disease ([place])      Carotid US 8/22: Bilateral ICA 1-39; right subclavian stenosis; right thyroid nodule   CHD (congenital heart disease)      pt unaware???   Chest pain      a. Adm 10-07/2013 - CTA neg for PE/dissection, cath with nonobstructive disease, CP ?due to uncontrolled HTN vs coronary vasospasm.   CHF (congestive heart failure) (HCC)     COPD (chronic obstructive pulmonary disease) (HCC)     Coronary artery disease     Echocardiogram      Echocardiogram 06/2019: EF 60-65, impaired relaxation (Gr 1 DD), mild MAC, mild MR, mild to mod aortic valve sclerosis (no AS), trivial TR, mild LAE, normal RVSF, RVSP 30.7 (mildly elevated)   GERD (gastroesophageal reflux disease)     Hyperlipidemia  Hypertension     Neuropathy     OA (osteoarthritis)     Osteoporosis     PAD (peripheral artery disease) (HCC)      a. s/p L fem-pop bypass b. recent evaluation at [facility], unamenable to intervention   PAD (peripheral artery disease) ([place])     Palpitations 02/22/2016   Pneumonia     Pre-diabetes     Premature ventricular contraction     PUD (peptic ulcer disease)     PVD (peripheral vascular disease) ([place]) 07/17/2013   Thoracic aortic aneurysm (HCC)      Chest/aorta CTA 04/2022: 14 x 8 focal saccular aneurysm versus penetrating atherosclerotic ulcer involving left side of proximal portion of transverse aortic arch; descending thoracic aorta and proximal abdominal aorta aneurysm 4.4 cm; aortic  atherosclerosis; emphysema   Tobacco abuse     Wears dentures     Wears glasses             Past Surgical History:  Procedure Laterality Date   ABDOMINAL AORTOGRAM W/LOWER EXTREMITY N/A 03/09/2020    Procedure: ABDOMINAL AORTOGRAM W/LOWER EXTREMITY;  Surgeon: [name], MD;  Location: [place] [facility];  Service: Cardiovascular;  Laterality: N/A;   ABDOMINAL AORTOGRAM W/LOWER EXTREMITY N/A 07/26/2022    Procedure: ABDOMINAL AORTOGRAM W/LOWER EXTREMITY;  Surgeon: [name], MD;  Location: [place] [facility];  Service: Cardiovascular;  Laterality: N/A;   ABDOMINAL HYSTERECTOMY       BREAST SURGERY        left cyst excision x 2   CARDIAC CATHETERIZATION        several years ago, nonobstructive, "50-60% blockages"   ENDARTERECTOMY FEMORAL Right 03/18/2020    Procedure: ENDARTERECTOMY FEMORAL WITH PROFUNDAPLASTY;  Surgeon: [name], MD;  Location: [place];  Service: Vascular;  Laterality: Right;   FEMORAL-POPLITEAL BYPASS GRAFT Right 03/18/2020    Procedure: BYPASS GRAFT FEMORAL-POPLITEAL ARTERY using GORE PROPATEN VASCULAR GRAFT;  Surgeon: [name], MD;  Location: [place];  Service: Vascular;  Laterality: Right;   HIP SURGERY        left - bars and screws placed   [place] Right 03/18/2020    Procedure: INSERTION OF GORE VIABAHN  ILIAC STENT;  Surgeon: [name], MD;  Location: [place];  Service: Vascular;  Laterality: Right;   [place] N/A 07/17/2013    Procedure: LEFT HEART CATHETERIZATION WITH CORONARY ANGIOGRAM;  Surgeon: Larey Dresser, MD;  Location: [facility] C[facility];  Service: Cardiovascular;  Laterality: N/A;   LOWER EXTREMITY ANGIOGRAM Right 03/18/2020    Procedure: RIGHT ILIAC ARTERIOGRAM WITH AN ILIAC STENT;  Surgeon: [name], MD;  Location: [place];  Service: Vascular;  Laterality: Right;   MULTIPLE TOOTH EXTRACTIONS       PATCH ANGIOPLASTY Right  03/18/2020    Procedure: PATCH ANGIOPLASTY using a [place] OF THE PROFUNDA;  Surgeon: [name], MD;  Location: [place];  Service: Vascular;  Laterality: Right;   PERIPHERAL VASCULAR CATHETERIZATION N/A 07/16/2016    Procedure: Abdominal Aortogram w/Lower Extremity;  Surgeon: Rosetta Posner, MD;  Location: [place] [facility];  Service: Cardiovascular;  Laterality: N/A;   PERIPHERAL VASCULAR INTERVENTION Left 03/09/2020    Procedure: PERIPHERAL VASCULAR INTERVENTION;  Surgeon: [name], MD;  Location: [place] [facility];  Service: Cardiovascular;  Laterality: Left;  common and external illiac    ROTATOR CUFF  REPAIR        right side, had torn ligaments as well   TUBAL LIGATION       VEIN BYPASS SURGERY        left leg   WRIST ARTHROCENTESIS        left - broke in 3 places so has bars and screws placed           Family History  Problem Relation Age of Onset   Cervical cancer Mother     Heart disease Mother          MI in 5s   Liver cancer Father     Heart disease Father     Lung cancer Maternal Aunt     Prostate cancer Maternal Uncle          met. anal   Breast cancer Maternal Grandmother     Lung cancer Maternal Aunt     Lung cancer Maternal Aunt     Stomach cancer Maternal Aunt     Colon cancer Maternal Aunt     Lung cancer Maternal Uncle        SOCIAL HISTORY: Social History         Tobacco Use   Smoking status: Every Day      Years: 50.00      Types: Cigarettes   Smokeless tobacco: Never  Substance Use Topics   Alcohol use: Never           Allergies  Allergen Reactions   Bee Venom Anaphylaxis and Other (See Comments)      Welts, also    Insect bites- Welts, also   Insect Extract Anaphylaxis and Other (See Comments)      Insect bites- Welts, also   Dapagliflozin Other (See Comments)   Gabapentin Other (See Comments)   Nsaids Nausea Only and Other (See Comments)      Patient is not suppose to have this class of medication  (liver)   Pregabalin Other (See Comments)   Latex Rash and Other (See Comments)            Current Outpatient Medications  Medication Sig Dispense Refill   albuterol (PROVENTIL HFA;VENTOLIN HFA) 108 (90 BASE) MCG/ACT inhaler Inhale 2 puffs into the lungs every 6 (six) hours as needed for wheezing or shortness of breath.       albuterol (PROVENTIL) (2.5 MG/3ML) 0.083% nebulizer solution Take 2.5 mg by nebulization every 6 (six) hours as needed for wheezing or shortness of breath.       alendronate (FOSAMAX) 70 MG tablet Take 70 mg by mouth every Monday.       allopurinol (ZYLOPRIM) 300 MG tablet Take 300 mg by mouth daily.        aspirin EC 81 MG tablet Take 81 mg by mouth daily.        atorvastatin (LIPITOR) 80 MG tablet Take 80 mg by mouth at bedtime.       calcium carbonate (ANTACID) 500 MG chewable tablet TAKE 1 TABLET BY MOUTH TWICE A DAY FOR 30 DAYS       Cholecalciferol (VITAMIN D) 125 MCG (5000 UT) CAPS Take 5,000 Units by mouth daily.       clopidogrel (PLAVIX) 75 MG tablet Take 1 tablet (75 mg total) by mouth daily. 30 tablet 11   cyanocobalamin (VITAMIN B12) 1000 MCG tablet Take 1,000 mcg by mouth daily.       diphenhydrAMINE (BENADRYL) 25 MG tablet Take 50 mg by mouth at bedtime.  EPIPEN 2-PAK 0.3 MG/0.3ML SOAJ injection Inject 0.3 mg into the muscle daily as needed for anaphylaxis. Use as directed as needed for allergies.       ezetimibe (ZETIA) 10 MG tablet TAKE 1 TABLET BY MOUTH EVERY DAY 90 tablet 2   FLUAD QUADRIVALENT 0.5 ML injection         furosemide (LASIX) 40 MG tablet Take 1 tablet (40 mg total) by mouth daily. For 5 days only then repeat labs. 30 tablet 0   lactulose (CHRONULAC) 10 GM/15ML solution Take 20 g by mouth 2 (two) times daily.       Menthol-Methyl Salicylate (MUSCLE RUB) 10-15 % CREA Apply 1 application topically daily as needed (for arthritis pain).       nitroGLYCERIN (NITROSTAT) 0.4 MG SL tablet Place 1 tablet (0.4 mg total) under the tongue every 5  (five) minutes x 3 doses as needed for chest pain. 25 tablet 4   Oxycodone HCl 10 MG TABS Take 1 tablet (10 mg total) by mouth 4 (four) times daily as needed (for pain). 20 tablet 0   pantoprazole (PROTONIX) 40 MG tablet Take 40 mg by mouth 2 (two) times daily.       potassium chloride SA (KLOR-CON M20) 20 MEQ tablet Take 1 tablet (20 mEq total) by mouth daily. 90 tablet 1   senna-docusate (SENOKOT-S) 8.6-50 MG tablet Take 2 tablets by mouth at bedtime.       spironolactone (ALDACTONE) 25 MG tablet Take 1 tablet (25 mg total) by mouth daily. 90 tablet 3   verapamil (CALAN-SR) 240 MG CR tablet TAKE 1 TABLET BY MOUTH EVERYDAY AT BEDTIME 90 tablet 2    No current facility-administered medications for this visit.             Facility-Administered Medications Ordered in Other Visits  Medication Dose Route Frequency Provider Last Rate Last Admin   DOBUTamine (DOBUTREX) 1,000 mcg/mL in dextrose 5% 250 mL infusion  30 mcg/kg/min Intravenous Titrated Dorothy Spark, MD          REVIEW OF SYSTEMS:  _0  denotes positive finding, _1  denotes negative finding Cardiac   Comments:  Chest pain or chest pressure:      Shortness of breath upon exertion:      Short of breath when lying flat:      Irregular heart rhythm:             Vascular      Pain in calf, thigh, or hip brought on by ambulation:      Pain in feet at night that wakes you up from your sleep:       Blood clot in your veins:      Leg swelling:              Pulmonary      Oxygen at home:      Productive cough:       Wheezing:              Neurologic      Sudden weakness in arms or legs:       Sudden numbness in arms or legs:       Sudden onset of difficulty speaking or slurred speech:      Temporary loss of vision in one eye:       Problems with dizziness:              Gastrointestinal      Blood in stool:  Vomited blood:              Genitourinary      Burning when urinating:       Blood in urine:              Psychiatric      Major depression:              Hematologic      Bleeding problems:      Problems with blood clotting too easily:             Skin      Rashes or ulcers:             Constitutional      Fever or chills:          PHYSICAL EXAM:    Vitals:    08/28/22 0857  BP: (!) 168/74  Pulse: 81  Resp: 14  Temp: (!) 97.4 F (36.3 C)  TempSrc: Temporal  SpO2: 94%  Weight: 122 lb (55.3 kg)  Height: _0  (1.727 m)      GENERAL: The patient is a well-nourished female, in no acute distress. The vital signs are documented above. CARDIAC: There is a regular rate and rhythm.  VASCULAR:  Palpable femoral pulses bilaterally Brisk left DP and PT signals Left shin wound healing PULMONARY: No respiratory distress. ABDOMEN: Soft and non-tender. MUSCULOSKELETAL: There are no major deformities or cyanosis. NEUROLOGIC: No focal weakness or paresthesias are detected.        DATA:    Left lower extremity arterial duplex shows a high-grade stenosis in the proximal bypass graft just distal to the common femoral artery with a velocity of 322 suggesting greater than 70% stenosis.   ABI 1.04 biphasic and 1.06 monophasic    Assessment/Plan:   72 y.o. female presents for hospital follow-up.  She most recently underwent recanalization of an occluded left common femoral to above-knee popliteal bypass placed by Dr. Donnetta Hutching about 20 years ago.  Ultimately her duplex today shows that the Viabahn's placed in the left common femoral to above-knee popliteal bypass are patent although there appears to be a persistent high-grade stenosis in the proximal graft just distal to the common femoral artery.  I have recommended another aortogram, lower extremity arteriogram, with possible intervention given there is fairly sluggish velocities in the left leg bypass graft that would put her at high risk for early failure.  I also discussed the importance of smoking cessation.  She will continue aspirin and  Plavix.  Will get her scheduled after Christmas at her request.     [name], MD [facility]: [phone]

## 2022-09-20 NOTE — Op Note (Signed)
    Patient name: Marie Johnson MRN: [id number] DOB: 1950/09/30 Sex: female  09/20/2022 Pre-operative Diagnosis: Concern for high-grade stenosis in proximal left common femoral to above-knee popliteal artery bypass Post-operative diagnosis:  Same Surgeon:  [name], MD Procedure Performed: 1.  Ultrasound-guided access right common femoral artery 2.  Aortogram with catheter selection of aorta 3.  Left lower extremity arteriogram with selection of second-order branches 4.  23 minutes of monitored moderate conscious sedation time  Indications: 72 year old female that previously had a left common femoral to above-knee popliteal artery bypass by Dr. Donnetta Hutching about 20 years ago.  This ultimately occluded earlier this year and she developed a foot wound and underwent relining of the left leg bypass with Viabahn's.  On recent surveillance there was concern for high-grade stenosis in the proximal bypass.  She presents today for evaluation of the left leg bypass after risk benefits discussed.  Findings:   Aortogram showed diffusely diseased and calcified aortoiliac segment.  The left common and external iliac stents were widely patent without any flow-limiting stenosis.  The left common femoral was diseased but patent as well as the profunda without flow limiting stenosis.  The left common femoral to above-knee popliteal bypass that has been relined with Viabahn's is widely patent with brisk flow.  I did not see any evidence of a proximal stenosis.  The bypass was widely patent.  Runoff distally is through peroneal and that is very brisk into the foot.  No intervention was performed.   Procedure:  The patient was identified in the holding area and taken to room 8.  The patient was then placed supine on the table and prepped and draped in the usual sterile fashion.  A time out was called.  The patient received Versed and fentanyl for moderate conscious sedation.  Vital signs were monitored including  heart rate, respiratory rate, oxygenation and blood pressure.  I was present for all of moderate sedation.  Ultrasound was used to evaluate the right common femoral artery.  It was patent .  A digital ultrasound image was acquired.  A micropuncture needle was used to access the right common femoral artery under ultrasound guidance.  An 018 wire was advanced without resistance and a micropuncture sheath was placed.  The 018 wire was removed and a benson wire was placed.  The micropuncture sheath was exchanged for a 5 french sheath.  An omniflush catheter was advanced over the wire to the level of L-1.  An abdominal angiogram was obtained.  Next, using the omniflush catheter and a benson wire, the aortic bifurcation was crossed and the catheter was placed into theleft external iliac artery and left runoff was obtained.  No evidence of stenosis was identified in the bypass with brisk flow.  No intervention was performed.  Wires and catheters were removed.  She will be taken to holding to have the sheath removed.  Plan: No evidence of high-grade proximal stenosis in the left leg bypass since it was relined.  Discussed she stay on aspirin Plavix statin.  I will see her in 1 month for wound check to make sure her left foot wound is healing .   [name], MD [facility] [place] Office: [phone]

## 2022-09-20 NOTE — Progress Notes (Signed)
Site area: Right groin a 5 french arterial sheath was removed  Site Prior to Removal:  Level 0  Pressure Applied For 20 MINUTES    Bedrest Beginning at 0900am X 4 hours  Manual:   Yes.    Patient Status During Pull:  stable  Post Pull Groin Site:  Level 0  Post Pull Instructions Given:  Yes.    Post Pull Pulses Present:  Yes.    Dressing Applied:  Yes.    Comments:

## 2022-09-25 DIAGNOSIS — M25552 Pain in left hip: Secondary | ICD-10-CM | POA: Diagnosis not present

## 2022-09-25 DIAGNOSIS — Z79899 Other long term (current) drug therapy: Secondary | ICD-10-CM | POA: Diagnosis not present

## 2022-09-25 DIAGNOSIS — M25551 Pain in right hip: Secondary | ICD-10-CM | POA: Diagnosis not present

## 2022-09-25 DIAGNOSIS — Z79891 Long term (current) use of opiate analgesic: Secondary | ICD-10-CM | POA: Diagnosis not present

## 2022-09-25 DIAGNOSIS — R030 Elevated blood-pressure reading, without diagnosis of hypertension: Secondary | ICD-10-CM | POA: Diagnosis not present

## 2022-09-25 DIAGNOSIS — I739 Peripheral vascular disease, unspecified: Secondary | ICD-10-CM | POA: Diagnosis not present

## 2022-09-25 DIAGNOSIS — G8929 Other chronic pain: Secondary | ICD-10-CM | POA: Diagnosis not present

## 2022-09-25 DIAGNOSIS — Z6820 Body mass index (BMI) 20.0-20.9, adult: Secondary | ICD-10-CM | POA: Diagnosis not present

## 2022-09-25 DIAGNOSIS — Z681 Body mass index (BMI) 19 or less, adult: Secondary | ICD-10-CM | POA: Diagnosis not present

## 2022-10-17 NOTE — Progress Notes (Signed)
Office Note     CC:  follow up Requesting Provider:  Leonides Sake, MD  HPI: Marie Johnson is a 72 y.o. (01/26/1951) female who presents for follow up wound check and she is s/p Aortogram, arteriogram of left lower extremity on 09/20/22 by Dr. Carlis Abbott. Findings were: Aortogram showed diffusely diseased and calcified aortoiliac segment.  The left common and external iliac stents were widely patent without any flow-limiting stenosis. The left common femoral was diseased but patent as well as the profunda without flow limiting stenosis.  The left common femoral to above-knee popliteal bypass that has been relined with Viabahn's is widely patent with brisk flow.  I did not see any evidence of a proximal stenosis.  The bypass was widely patent.  Runoff distally is through peroneal and that is very brisk into the foot. No intervention was performed. This was performed secondary to high velocities identified on duplex follow up of her bypass graft with concerns of impending graft failure.   She does have a wound on her left foot that was present at her last visit. There was concern on duplex with low velocities in her LLE bypass graft that there was impending failure of the bypass and in setting of wound Arteriogram was recommended. However as stated above no intervention was needed as bypass looked patent. Today she reports pain in her feet and around ankles- burning, stinging, tingling. Worse at night time. She does not tolerate Gabapentin or lyrica due to nausea and vomiting.   She has complex history that includes the following: left common femoral to above-knee popliteal bypass by Dr. Donnetta Hutching about 20 years ago. Dr. Carlis Abbott then initially saw her with bilateral lower extremity critical limb ischemia.  On 03/16/2020 she underwent left common and external iliac artery angioplasty with stent placement with a patent bypass that Dr. Donnetta Hutching had previously performed.  On 03/18/2020, Dr. Carlis Abbott performed right common  femoral endarterectomy with retrograde right common iliac artery stenting and then a right common femoral to below knee pop bypass with PTFE.   Also seen for descending thoracic aneurysm. 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  The pt is on a statin for cholesterol management.  The pt is on a daily aspirin.   Other AC:  Plavix The pt is on ACEI for hypertension.   The pt is not diabetic.  Tobacco hx:  current, 1/2 ppd  Past Medical History:  Diagnosis Date   AAA (abdominal aortic aneurysm) (HCC)    3.4 cm   Anemia    Anxiety    Aortic atherosclerosis (HCC)    Arthritis    Asthma    CAD (coronary artery disease)    a. Nonobstructive CAD by cath 07/17/13.   Carotid artery disease ([place])    Carotid US 8/22: Bilateral ICA 1-39; right subclavian stenosis; right thyroid nodule   CHD (congenital heart disease)    pt unaware???   Chest pain    a. Adm 10-07/2013 - CTA neg for PE/dissection, cath with nonobstructive disease, CP ?due to uncontrolled HTN vs coronary vasospasm.   CHF (congestive heart failure) (HCC)    COPD (chronic obstructive pulmonary disease) (HCC)    Coronary artery disease  Echocardiogram    Echocardiogram 06/2019: EF 60-65, impaired relaxation (Gr 1 DD), mild MAC, mild MR, mild to mod aortic valve sclerosis (no AS), trivial TR, mild LAE, normal RVSF, RVSP 30.7 (mildly elevated)   GERD (gastroesophageal reflux disease)    Hyperlipidemia    Hypertension    Neuropathy    OA (osteoarthritis)    Osteoporosis    PAD (peripheral artery disease) (HCC)    a. s/p L fem-pop bypass b. recent evaluation at [facility], unamenable to intervention   PAD (peripheral artery disease) ([place])    Palpitations 02/22/2016    Pneumonia    Pre-diabetes    Premature ventricular contraction    PUD (peptic ulcer disease)    PVD (peripheral vascular disease) ([place]) 07/17/2013   Thoracic aortic aneurysm (HCC)    Chest/aorta CTA 04/2022: 14 x 8 focal saccular aneurysm versus penetrating atherosclerotic ulcer involving left side of proximal portion of transverse aortic arch; descending thoracic aorta and proximal abdominal aorta aneurysm 4.4 cm; aortic atherosclerosis; emphysema   Tobacco abuse    Wears dentures    Wears glasses     Past Surgical History:  Procedure Laterality Date   ABDOMINAL AORTOGRAM W/LOWER EXTREMITY N/A 03/09/2020   Procedure: ABDOMINAL AORTOGRAM W/LOWER EXTREMITY;  Surgeon: [name], MD;  Location: [place] [facility];  Service: Cardiovascular;  Laterality: N/A;   ABDOMINAL AORTOGRAM W/LOWER EXTREMITY N/A 07/26/2022   Procedure: ABDOMINAL AORTOGRAM W/LOWER EXTREMITY;  Surgeon: [name], MD;  Location: [place] [facility];  Service: Cardiovascular;  Laterality: N/A;   ABDOMINAL AORTOGRAM W/LOWER EXTREMITY N/A 09/20/2022   Procedure: ABDOMINAL AORTOGRAM W/LOWER EXTREMITY;  Surgeon: [name], MD;  Location: [place] [facility];  Service: Cardiovascular;  Laterality: N/A;   ABDOMINAL HYSTERECTOMY     BREAST SURGERY     left cyst excision x 2   CARDIAC CATHETERIZATION     several years ago, nonobstructive, "50-60% blockages"   ENDARTERECTOMY FEMORAL Right 03/18/2020   Procedure: ENDARTERECTOMY FEMORAL WITH PROFUNDAPLASTY;  Surgeon: [name], MD;  Location: [place];  Service: Vascular;  Laterality: Right;   FEMORAL-POPLITEAL BYPASS GRAFT Right 03/18/2020   Procedure: BYPASS GRAFT FEMORAL-POPLITEAL ARTERY using GORE PROPATEN VASCULAR GRAFT;  Surgeon: [name], MD;  Location: [place];  Service: Vascular;  Laterality: Right;   HIP SURGERY     left - bars and screws placed   [place] Right 03/18/2020   Procedure: INSERTION OF  GORE VIABAHN  ILIAC STENT;  Surgeon: [name], MD;  Location: [place];  Service: Vascular;  Laterality: Right;   [place] N/A 07/17/2013   Procedure: LEFT HEART CATHETERIZATION WITH CORONARY ANGIOGRAM;  Surgeon: Larey Dresser, MD;  Location: [facility] CATH LAB;  Service: Cardiovascular;  Laterality: N/A;   LOWER EXTREMITY ANGIOGRAM Right 03/18/2020   Procedure: RIGHT ILIAC ARTERIOGRAM WITH AN ILIAC STENT;  Surgeon: [name], MD;  Location: [place];  Service: Vascular;  Laterality: Right;   MULTIPLE TOOTH EXTRACTIONS     PATCH ANGIOPLASTY Right 03/18/2020   Procedure: PATCH ANGIOPLASTY using a [place] OF THE PROFUNDA;  Surgeon: [name], MD;  Location: [place];  Service: Vascular;  Laterality: Right;   PERIPHERAL VASCULAR CATHETERIZATION N/A 07/16/2016   Procedure: Abdominal Aortogram w/Lower Extremity;  Surgeon: Rosetta Posner, MD;  Location: [place] [facility];  Service: Cardiovascular;  Laterality: N/A;   PERIPHERAL VASCULAR INTERVENTION Left 03/09/2020  Procedure: PERIPHERAL VASCULAR INTERVENTION;  Surgeon: [name], MD;  Location: [place] [facility];  Service: Cardiovascular;  Laterality: Left;  common and external illiac    ROTATOR CUFF REPAIR     right side, had torn ligaments as well   TUBAL LIGATION     VEIN BYPASS SURGERY     left leg   WRIST ARTHROCENTESIS     left - broke in 3 places so has bars and screws placed    Social History   Socioeconomic History   Marital status: Married    Spouse name: Not on file   Number of children: Not on file   Years of education: Not on file   Highest education level: Not on file  Occupational History   Not on file  Tobacco Use   Smoking status: Every Day    Years: 50.00    Types: Cigarettes    Passive exposure: Current (Spouse is a smoker)   Smokeless tobacco: Never  Vaping Use   Vaping Use: Never used  Substance and Sexual Activity   Alcohol  use: Never   Drug use: Never   Sexual activity: Not on file  Other Topics Concern   Not on file  Social History Narrative   ** Merged History Encounter **       Pt lives in [place] with husband.  Retired Production designer, theatre/television/film of Radio broadcast assistant Strain: Not on [employer] Insecurity: Not on file  Transportation Needs: Not on file  Physical Activity: Not on file  Stress: Not on file  Social Connections: Not on file  Intimate Partner Violence: Not on file    Family History  Problem Relation Age of Onset   Cervical cancer Mother    Heart disease Mother        MI in 82s   Liver cancer Father    Heart disease Father    Lung cancer Maternal Aunt    Prostate cancer Maternal Uncle        met. anal   Breast cancer Maternal Grandmother    Lung cancer Maternal Aunt    Lung cancer Maternal Aunt    Stomach cancer Maternal Aunt    Colon cancer Maternal Aunt    Lung cancer Maternal Uncle     Current Outpatient Medications  Medication Sig Dispense Refill   albuterol (PROVENTIL HFA;VENTOLIN HFA) 108 (90 BASE) MCG/ACT inhaler Inhale 2 puffs into the lungs every 6 (six) hours as needed for wheezing or shortness of breath.     albuterol (PROVENTIL) (2.5 MG/3ML) 0.083% nebulizer solution Take 2.5 mg by nebulization every 6 (six) hours as needed for wheezing or shortness of breath.     alendronate (FOSAMAX) 70 MG tablet Take 70 mg by mouth every Wednesday.     allopurinol (ZYLOPRIM) 300 MG tablet Take 300 mg by mouth every evening.     aspirin EC 81 MG tablet Take 81 mg by mouth in the morning.     atorvastatin (LIPITOR) 80 MG tablet Take 80 mg by mouth at bedtime.     calcium carbonate (ANTACID) 500 MG chewable tablet Chew 1 tablet by mouth 2 (two) times daily as needed for heartburn or indigestion.     Cholecalciferol (VITAMIN D) 125 MCG (5000 UT) CAPS Take 5,000 Units by mouth in the morning.     clopidogrel (PLAVIX) 75 MG tablet Take 1 tablet (75 mg total) by  mouth daily. 30 tablet 11   cyanocobalamin (VITAMIN  B12) 1000 MCG tablet Take 1,000 mcg by mouth daily.     diphenhydrAMINE (BENADRYL) 25 MG tablet Take 50 mg by mouth at bedtime.     EPIPEN 2-PAK 0.3 MG/0.3ML SOAJ injection Inject 0.3 mg into the muscle daily as needed for anaphylaxis. Use as directed as needed for allergies.     ezetimibe (ZETIA) 10 MG tablet TAKE 1 TABLET BY MOUTH EVERY DAY (Patient taking differently: Take 10 mg by mouth at bedtime.) 90 tablet 2   furosemide (LASIX) 40 MG tablet Take 1 tablet (40 mg total) by mouth daily. 90 tablet 3   lactulose (CHRONULAC) 10 GM/15ML solution Take 20 g by mouth 2 (two) times daily.     Menthol-Methyl Salicylate (MUSCLE RUB) 10-15 % CREA Apply 1 application topically daily as needed (for arthritis pain).     nitroGLYCERIN (NITROSTAT) 0.4 MG SL tablet Place 1 tablet (0.4 mg total) under the tongue every 5 (five) minutes x 3 doses as needed for chest pain. 25 tablet 4   Oxycodone HCl 10 MG TABS Take 1 tablet (10 mg total) by mouth 4 (four) times daily as needed (for pain). 20 tablet 0   pantoprazole (PROTONIX) 40 MG tablet Take 40 mg by mouth 2 (two) times daily.     potassium chloride SA (KLOR-CON M20) 20 MEQ tablet Take 1 tablet (20 mEq total) by mouth daily. 90 tablet 1   senna-docusate (SENOKOT-S) 8.6-50 MG tablet Take 2 tablets by mouth at bedtime.     spironolactone (ALDACTONE) 25 MG tablet Take 1 tablet (25 mg total) by mouth daily. 90 tablet 3   verapamil (CALAN-SR) 240 MG CR tablet TAKE 1 TABLET BY MOUTH EVERYDAY AT BEDTIME 90 tablet 2   No current facility-administered medications for this visit.   Facility-Administered Medications Ordered in Other Visits  Medication Dose Route Frequency Provider Last Rate Last Admin   DOBUTamine (DOBUTREX) 1,000 mcg/mL in dextrose 5% 250 mL infusion  30 mcg/kg/min Intravenous Titrated Dorothy Spark, MD        Allergies  Allergen Reactions   Bee Venom Anaphylaxis and Other (See Comments)     Welts, also    Insect bites- Welts, also   Insect Extract Anaphylaxis and Other (See Comments)    Insect bites- Welts, also   [place] [Dapagliflozin] Nausea And Vomiting   Lyrica [Pregabalin] Nausea And Vomiting   Neurontin [Gabapentin] Nausea And Vomiting and Other (See Comments)    Hypertension/dizziness   Nsaids Nausea Only and Other (See Comments)    Patient is not suppose to have this class of medication (liver)   Latex Rash and Other (See Comments)     REVIEW OF SYSTEMS:  [X]  denotes positive finding, [ ]  denotes negative finding Cardiac  Comments:  Chest pain or chest pressure:    Shortness of breath upon exertion:    Short of breath when lying flat:    Irregular heart rhythm:        Vascular    Pain in calf, thigh, or hip brought on by ambulation:    Pain in feet at night that wakes you up from your sleep:     Blood clot in your veins:    Leg swelling:         Pulmonary    Oxygen at home:    Productive cough:     Wheezing:         Neurologic    Sudden weakness in arms or legs:     Sudden numbness in arms  or legs:     Sudden onset of difficulty speaking or slurred speech:    Temporary loss of vision in one eye:     Problems with dizziness:         Gastrointestinal    Blood in stool:     Vomited blood:         Genitourinary    Burning when urinating:     Blood in urine:        Psychiatric    Major depression:         Hematologic    Bleeding problems:    Problems with blood clotting too easily:        Skin    Rashes or ulcers:        Constitutional    Fever or chills:      PHYSICAL EXAMINATION:  Vitals:   10/23/22 0849  BP: (!) 144/68  Pulse: 80  Resp: 20  Temp: 98.3 F (36.8 C)  TempSrc: Temporal  SpO2: 100%  Weight: 126 lb 3.2 oz (57.2 kg)  Height: 5' 7"  (1.702 m)    General:  WDWN in NAD; vital signs documented above Gait: Normal HENT: WNL, normocephalic Pulmonary: normal non-labored breathing , with inspiratory  wheezing Cardiac: regular HR, without  Murmurs without carotid bruit Abdomen: soft, NT, no masses Vascular Exam/Pulses:  Right Left  Radial 2+ (normal) 2+ (normal)  Femoral 2+ (normal) 2+ (normal)  Popliteal 2+ (normal) 2+ (normal)  DP absent absent  PT absent absent  Left shin wound healed Extremities: without ischemic changes, without Gangrene , without cellulitis; without open wounds;  Musculoskeletal: no muscle wasting or atrophy  Neurologic: A&O X 3;  No focal weakness or paresthesias are detected Psychiatric:  The pt has Normal affect.  ASSESSMENT/PLAN:: 72 y.o. female here for follow up wound check and she is s/p Aortogram, arteriogram of left lower extremity on 09/20/22 by Dr. Carlis Abbott. No intervention was indicated. Her left shin wound has healed. She continues to have neuropathic pain in both her feet/legs but no claudication or rest pain.  - Encourage her to continue maintain walking regimen/exercise - Continue Aspirin, Statin, Plavix - Encourage smoking cessation - She will be due for follow up in September for her descending thoracic aneurysm  - Follow up with CT surgery for surveillance of ascending thoracic aortic aneurysm - She will follow up in 6 months with ABI and bilateral lower extremity bypass graft duplex - She knows to call for earlier follow up if she has any new or concerning symptoms   Karoline Caldwell, PA-C [facility] [phone]  Clinic MD:   Roxanne Mins

## 2022-10-23 ENCOUNTER — Ambulatory Visit (INDEPENDENT_AMBULATORY_CARE_PROVIDER_SITE_OTHER): Payer: Medicare Other | Admitting: Physician Assistant

## 2022-10-23 VITALS — BP 144/68 | HR 80 | Temp 98.3°F | Resp 20 | Ht 67.0 in | Wt 126.2 lb

## 2022-10-23 DIAGNOSIS — I7123 Aneurysm of the descending thoracic aorta, without rupture: Secondary | ICD-10-CM | POA: Diagnosis not present

## 2022-10-23 DIAGNOSIS — I739 Peripheral vascular disease, unspecified: Secondary | ICD-10-CM

## 2022-10-26 ENCOUNTER — Other Ambulatory Visit: Payer: Self-pay

## 2022-10-26 DIAGNOSIS — I70222 Atherosclerosis of native arteries of extremities with rest pain, left leg: Secondary | ICD-10-CM

## 2022-10-26 DIAGNOSIS — I739 Peripheral vascular disease, unspecified: Secondary | ICD-10-CM | POA: Diagnosis not present

## 2022-10-26 DIAGNOSIS — Z681 Body mass index (BMI) 19 or less, adult: Secondary | ICD-10-CM | POA: Diagnosis not present

## 2022-10-26 DIAGNOSIS — Z9889 Other specified postprocedural states: Secondary | ICD-10-CM

## 2022-10-26 DIAGNOSIS — Z79891 Long term (current) use of opiate analgesic: Secondary | ICD-10-CM | POA: Diagnosis not present

## 2022-10-26 DIAGNOSIS — R030 Elevated blood-pressure reading, without diagnosis of hypertension: Secondary | ICD-10-CM | POA: Diagnosis not present

## 2022-10-26 DIAGNOSIS — I7123 Aneurysm of the descending thoracic aorta, without rupture: Secondary | ICD-10-CM

## 2022-10-26 DIAGNOSIS — M25552 Pain in left hip: Secondary | ICD-10-CM | POA: Diagnosis not present

## 2022-10-26 DIAGNOSIS — G8929 Other chronic pain: Secondary | ICD-10-CM | POA: Diagnosis not present

## 2022-10-26 DIAGNOSIS — M25551 Pain in right hip: Secondary | ICD-10-CM | POA: Diagnosis not present

## 2022-10-26 DIAGNOSIS — Z79899 Other long term (current) drug therapy: Secondary | ICD-10-CM | POA: Diagnosis not present

## 2022-11-13 DIAGNOSIS — Z1231 Encounter for screening mammogram for malignant neoplasm of breast: Secondary | ICD-10-CM | POA: Diagnosis not present

## 2022-11-13 DIAGNOSIS — Z6820 Body mass index (BMI) 20.0-20.9, adult: Secondary | ICD-10-CM | POA: Diagnosis not present

## 2023-01-23 DIAGNOSIS — G8929 Other chronic pain: Secondary | ICD-10-CM | POA: Diagnosis not present

## 2023-01-23 DIAGNOSIS — M25551 Pain in right hip: Secondary | ICD-10-CM | POA: Diagnosis not present

## 2023-01-23 DIAGNOSIS — R030 Elevated blood-pressure reading, without diagnosis of hypertension: Secondary | ICD-10-CM | POA: Diagnosis not present

## 2023-01-23 DIAGNOSIS — Z681 Body mass index (BMI) 19 or less, adult: Secondary | ICD-10-CM | POA: Diagnosis not present

## 2023-01-23 DIAGNOSIS — I739 Peripheral vascular disease, unspecified: Secondary | ICD-10-CM | POA: Diagnosis not present

## 2023-01-23 DIAGNOSIS — M25552 Pain in left hip: Secondary | ICD-10-CM | POA: Diagnosis not present

## 2023-01-29 NOTE — Progress Notes (Signed)
Fax received from [facility] on 01/07/23 for medical clearance/medication hold for direct laryngoscopy with biopsy to be signed by C. Chestine Spore, MD.  Provider signed on 01/10/23, form faxed back to sender on 01/10/23, verified successful.  Fax number changed, re faxed to new fax number on 01/29/23, verified successful, sent to scan center.

## 2023-02-04 ENCOUNTER — Other Ambulatory Visit: Payer: Self-pay | Admitting: Otolaryngology

## 2023-02-04 DIAGNOSIS — L570 Actinic keratosis: Secondary | ICD-10-CM | POA: Diagnosis not present

## 2023-02-04 DIAGNOSIS — L578 Other skin changes due to chronic exposure to nonionizing radiation: Secondary | ICD-10-CM | POA: Diagnosis not present

## 2023-02-14 ENCOUNTER — Ambulatory Visit: Payer: Medicare Other | Admitting: Cardiology

## 2023-02-18 ENCOUNTER — Other Ambulatory Visit: Payer: Self-pay

## 2023-02-18 DIAGNOSIS — I739 Peripheral vascular disease, unspecified: Secondary | ICD-10-CM

## 2023-02-18 DIAGNOSIS — I2510 Atherosclerotic heart disease of native coronary artery without angina pectoris: Secondary | ICD-10-CM

## 2023-02-18 DIAGNOSIS — E7849 Other hyperlipidemia: Secondary | ICD-10-CM

## 2023-02-18 DIAGNOSIS — M791 Myalgia, unspecified site: Secondary | ICD-10-CM

## 2023-02-18 DIAGNOSIS — I10 Essential (primary) hypertension: Secondary | ICD-10-CM

## 2023-02-18 MED ORDER — POTASSIUM CHLORIDE CRYS ER 20 MEQ PO TBCR: 20.0000 meq | EXTENDED_RELEASE_TABLET | Freq: Every day | ORAL | 1 refills | Status: DC

## 2023-02-22 DIAGNOSIS — R030 Elevated blood-pressure reading, without diagnosis of hypertension: Secondary | ICD-10-CM | POA: Diagnosis not present

## 2023-02-22 DIAGNOSIS — I739 Peripheral vascular disease, unspecified: Secondary | ICD-10-CM | POA: Diagnosis not present

## 2023-02-22 DIAGNOSIS — M25552 Pain in left hip: Secondary | ICD-10-CM | POA: Diagnosis not present

## 2023-02-22 DIAGNOSIS — Z681 Body mass index (BMI) 19 or less, adult: Secondary | ICD-10-CM | POA: Diagnosis not present

## 2023-02-22 DIAGNOSIS — M25551 Pain in right hip: Secondary | ICD-10-CM | POA: Diagnosis not present

## 2023-02-22 DIAGNOSIS — G8929 Other chronic pain: Secondary | ICD-10-CM | POA: Diagnosis not present

## 2023-03-12 ENCOUNTER — Other Ambulatory Visit: Payer: Self-pay

## 2023-03-12 ENCOUNTER — Encounter (HOSPITAL_COMMUNITY): Payer: Self-pay | Admitting: Otolaryngology

## 2023-03-12 NOTE — Progress Notes (Signed)
Marie Johnson denies chest pain or shortness of breath at rest.  Patient denies having any s/s of Covid in her household, also denies any known exposure to Covid. Marie Johnson denies  any s/s of upper or lower respiratory in the past 8 weeks. Marie Johnson does cough some, patient reports that she has been doing this for years, it is productive, mainly clear. Patient is a smoker and has asthma.  Marie Johnson has seen a pulmonologist in the past, patient states she needs to get another one.  Marie Johnson's PCP is Dr.Maura Hammrick, cardiologist is Dr. Laurance Flatten.

## 2023-03-12 NOTE — Anesthesia Preprocedure Evaluation (Addendum)
Anesthesia Evaluation  Patient identified by MRN, date of birth, ID band Patient awake    Reviewed: Allergy & Precautions, NPO status , Patient's Chart, lab work & pertinent test results  History of Anesthesia Complications Negative for: history of anesthetic complications  Airway Mallampati: II  TM Distance: >3 FB Neck ROM: Full    Dental  (+) Edentulous Lower, Edentulous Upper   Pulmonary shortness of breath, with exertion and at rest, asthma , COPD,  COPD inhaler, Current Smoker   Pulmonary exam normal        Cardiovascular hypertension, Pt. on medications + CAD, + Peripheral Vascular Disease and +CHF  Normal cardiovascular exam     Neuro/Psych   Anxiety        GI/Hepatic Neg liver ROS, PUD,GERD  Medicated,,  Endo/Other  negative endocrine ROS    Renal/GU negative Renal ROS     Musculoskeletal  (+) Arthritis ,    Abdominal   Peds  Hematology negative hematology ROS (+)   Anesthesia Other Findings Lesion of epiglottis  Reproductive/Obstetrics                             Anesthesia Physical Anesthesia Plan  ASA: 3  Anesthesia Plan: General   Post-op Pain Management: Tylenol PO (pre-op)*   Induction: Intravenous  PONV Risk Score and Plan: 2 and Treatment may vary due to age or medical condition, Dexamethasone, Ondansetron, Propofol infusion and TIVA  Airway Management Planned: Oral ETT  Additional Equipment: None  Intra-op Plan:   Post-operative Plan: Extubation in OR  Informed Consent: I have reviewed the patients History and Physical, chart, labs and discussed the procedure including the risks, benefits and alternatives for the proposed anesthesia with the patient or authorized representative who has indicated his/her understanding and acceptance.     Dental advisory given  Plan Discussed with: CRNA  Anesthesia Plan Comments: (See PAT note from 6/25 by Sherlie Ban  PA-C )        Anesthesia Quick Evaluation

## 2023-03-12 NOTE — Progress Notes (Signed)
Case: [id number] Date/Time: 03/13/23 1457   Procedure: DIRECT LARYNGOSCOPY WITH BIOPSY (Bilateral)   Anesthesia type: General   Pre-op diagnosis:      Laryngopharyngeal reflux      Dysphonia     Lesion of epiglottis   [facility]: [facility]OM 09 / [facility]   Surgeons: Christia Reading, MD       DISCUSSION: Marie Johnson is a 72 year old female who is being evaluated prior to surgery listed above.  Patient has been following with ENT for hoarseness and has been having periodic in office laryngoscopies. The last one on 12/31/22 showed a superficial ulceration with raised irregular borders on the epiglottis and so she is scheduled for biopsy.  PMH significant for every day smoking, asthma/COPD (uses inhalers/nebs), GERD (medicated), PAD s/p L fem-pop bypass, L CIA and EIA stenting 02/2020, R CIA stent, RFA endartecetomy and right fem-pop bypass, carotid artery disease, TAA, AAA, non-obstructive CAD, CHF, pre-diabetes.   Patient is followed by Vascular surgery for PAD and AAA. Last seen on 10/23/22 for follow up after an aortogram and arteriogram of left lower extremity on 09/20/22. Her bypass appeared patent. She will be due for follow up in September for her descending thoracic aneurysm. Clearance form signed by Dr. Chestine Spore on 01/10/23 that patient may hold Plavix for 3-5 days and to continue ASA if able.  Patient follows with CT surgery for TAA measuring 4.4cm. Last imaged on 04/20/22 and recommendations are to follow annually.  Patient follows with Cardiology for nonobstructive CAD and diastolic CHF. Last seen on 08/16/22. She reported chronic DOE which she previously had a stress test for in 2022 which was low risk. BP was well controlled. Noted to be euvolemic on exam. No significant changes made at this visit and she was advised to f/u in 6 months (follow up appointment is on 04/04/23)    VS:     10/23/2022    8:49 AM 09/20/2022   11:59 AM 09/20/2022   10:59 AM  Vitals with BMI  Height 5\' 7"     Weight 126 lbs 3  oz    BMI 19.76    Systolic 144 120 454  Diastolic 68 56 56  Pulse 80 80 75     PROVIDERS: Hamrick, Durward Fortes, MD Cardiology: Laurance Flatten, MD Vascular: Sherald Hess, MD CT surgery: Aloha Gell PA-C  LABS: Obtain DOS (all labs ordered are listed, but only abnormal results are displayed)  Labs Reviewed - No data to display   IMAGES:  CTA Chest/Aorta 04/20/22:  IMPRESSION: Grossly stable 14 x 8 mm focal saccular aneurysm or penetrating atherosclerotic ulcer is seen involving the left side of the proximal portion of the transverse aortic arch.   Also noted is stable 4.4 cm focal fusiform aneurysm involving the distal descending thoracic aorta and proximal abdominal aorta. Recommend annual imaging followup by CTA or MRA. This recommendation follows 2010 ACCF/AHA/AATS/ACR/ASA/SCA/SCAI/SIR/STS/SVM Guidelines for the Diagnosis and Management of Patients with Thoracic Aortic Disease. Circulation. 2010; 121: U981-X914. Aortic aneurysm NOS (ICD10-I71.9).   Mild bronchial wall thickening is noted in bilateral lower lobe bronchi concerning for bronchitis.   Aortic Atherosclerosis (ICD10-I70.0) and Emphysema (ICD10-J43.9).   EKG 01/26/2022:    NSR, first degree AVB, HR 82    CV:  Aortogram 09/20/22:  The left common femoral was diseased but patent as well as the profunda without flow limiting stenosis.  The left common femoral to above-knee popliteal bypass that has been relined with Viabahn's is widely patent with brisk flow.  I  did not see any evidence of a proximal stenosis.  The bypass was widely patent.  Runoff distally is through peroneal and that is very brisk into the foot.  No intervention was performed.   Echo 03/10/20:  IMPRESSIONS     1. Left ventricular ejection fraction, by estimation, is 55 to 60%. The  left ventricle has normal function. The left ventricle demonstrates  regional wall motion abnormalities (see scoring diagram/findings for   description). Basal to mid inferior  hypokinesis. There is mild left ventricular hypertrophy. Left ventricular  diastolic parameters are indeterminate.   2. Right ventricular systolic function is normal. The right ventricular  size is normal. Tricuspid regurgitation signal is inadequate for assessing  PA pressure.   3. The mitral valve is normal in structure. Trivial mitral valve  regurgitation.   4. The aortic valve is tricuspid. Aortic valve regurgitation is not  visualized. Mild to moderate aortic valve sclerosis/calcification is  present, without any evidence of aortic stenosis.   5. The inferior vena cava is normal in size with greater than 50%  respiratory variability, suggesting right atrial pressure of 3 mmHg.    Bilateral Carotid Doppler 05/11/2021:  Summary:  Right Carotid: Velocities in the right ICA are consistent with a 1-39%  stenosis.                 Non-hemodynamically significant plaque <50% noted in the  CCA. The                 ECA appears >50% stenosed.   Left Carotid: Velocities in the left ICA are consistent with a 1-39%  stenosis.                Non-hemodynamically significant plaque <50% noted in the  CCA. The                ECA appears >50% stenosed.   Vertebrals:  Bilateral vertebral arteries demonstrate antegrade flow.  Subclavians: Right subclavian artery flow was disturbed. Normal flow               hemodynamics were seen in the left subclavian artery.   Incidental findings: Complex lesion with increase vascularity noted in  the mid pole of the right thyroid, measuring 1.6 x 1.3 x 1.9 cm.    LONG TERM MONITOR (3-7 DAYS) INTERPRETATION 02/28/2021 Narrative  Patch wear time was 3 days and 4 hours.  Predominant rhythm was NSR with average HR 92 (ranging from 59-150bpm)  There were 3 runs of SVT with the longest lasting 7 beats at average rate of 126bpm; fastest was 7 beats at rate 150bpm  Patient triggered events mainly correlated with sinus  tachycardia, PACs and PVCs  Rare SVEs (<1%) and rare PVCs (<1%)   NM Stress test  02/17/2021 1.  Normal MPI study without evidence of ischemia or infarction. 2.  Normal left ventricular function, EF> 65%. 3.  This is a low risk study.    Cardiac catheterization 07/17/2013 LAD proximal 30-40 LCx ostial 40; OM1 30 ostial RCA proximal 30, mid 30   Past Medical History:  Diagnosis Date   AAA (abdominal aortic aneurysm) (HCC)    3.4 cm   Anemia    Anxiety    Aortic atherosclerosis (HCC)    Arthritis    Asthma    CAD (coronary artery disease)    a. Nonobstructive CAD by cath 07/17/13.   Carotid artery disease (HCC)    Carotid US 8/22: Bilateral ICA 1-39; right subclavian stenosis; right  thyroid nodule   CHD (congenital heart disease)    pt unaware???   Chest pain    a. Adm 10-07/2013 - CTA neg for PE/dissection, cath with nonobstructive disease, CP ?due to uncontrolled HTN vs coronary vasospasm.   CHF (congestive heart failure) (HCC)    COPD (chronic obstructive pulmonary disease) (HCC)    Coronary artery disease    Echocardiogram    Echocardiogram 06/2019: EF 60-65, impaired relaxation (Gr 1 DD), mild MAC, mild MR, mild to mod aortic valve sclerosis (no AS), trivial TR, mild LAE, normal RVSF, RVSP 30.7 (mildly elevated)   GERD (gastroesophageal reflux disease)    Hyperlipidemia    Hypertension    Neuropathy    OA (osteoarthritis)    Osteoporosis    PAD (peripheral artery disease) (HCC)    a. s/p L fem-pop bypass b. recent evaluation at [facility], unamenable to intervention   PAD (peripheral artery disease) (HCC)    Palpitations 02/22/2016   Pneumonia    Pre-diabetes    Premature ventricular contraction    PUD (peptic ulcer disease)    PVD (peripheral vascular disease) (HCC) 07/17/2013   Thoracic aortic aneurysm (HCC)    Chest/aorta CTA 04/2022: 14 x 8 focal saccular aneurysm versus penetrating atherosclerotic ulcer involving left side of proximal portion of transverse aortic  arch; descending thoracic aorta and proximal abdominal aorta aneurysm 4.4 cm; aortic atherosclerosis; emphysema   Tobacco abuse    Wears dentures    Wears glasses     Past Surgical History:  Procedure Laterality Date   ABDOMINAL AORTOGRAM W/LOWER EXTREMITY N/A 03/09/2020   Procedure: ABDOMINAL AORTOGRAM W/LOWER EXTREMITY;  Surgeon: Cephus Shelling, MD;  [facility];  Service: Cardiovascular;  Laterality: N/A;   ABDOMINAL AORTOGRAM W/LOWER EXTREMITY N/A 07/26/2022   Procedure: ABDOMINAL AORTOGRAM W/LOWER EXTREMITY;  Surgeon: Cephus Shelling, MD;  [facility];  Service: Cardiovascular;  Laterality: N/A;   ABDOMINAL AORTOGRAM W/LOWER EXTREMITY N/A 09/20/2022   Procedure: ABDOMINAL AORTOGRAM W/LOWER EXTREMITY;  Surgeon: Cephus Shelling, MD;  [facility];  Service: Cardiovascular;  Laterality: N/A;   ABDOMINAL HYSTERECTOMY     BREAST SURGERY     left cyst excision x 2   CARDIAC CATHETERIZATION     several years ago, nonobstructive, "50-60% blockages"   ENDARTERECTOMY FEMORAL Right 03/18/2020   Procedure: ENDARTERECTOMY FEMORAL WITH PROFUNDAPLASTY;  Surgeon: Cephus Shelling, MD;  Location: [facility] OR;  Service: Vascular;  Laterality: Right;   FEMORAL-POPLITEAL BYPASS GRAFT Right 03/18/2020   Procedure: BYPASS GRAFT FEMORAL-POPLITEAL ARTERY using GORE PROPATEN VASCULAR GRAFT;  Surgeon: Cephus Shelling, MD;  Location: [facility];  Service: Vascular;  Laterality: Right;   HIP SURGERY     left - bars and screws placed   HIP SURGERY     INSERTION OF ILIAC STENT Right 03/18/2020   Procedure: INSERTION OF GORE VIABAHN  ILIAC STENT;  Surgeon: Cephus Shelling, MD;  Location: [facility];  Service: Vascular;  Laterality: Right;   LEFT HEART CATHETERIZATION WITH CORONARY ANGIOGRAM N/A 07/17/2013   Procedure: LEFT HEART CATHETERIZATION WITH CORONARY ANGIOGRAM;  Surgeon: Laurey Morale, MD;  Location: [facility] CATH LAB;  Service: Cardiovascular;   Laterality: N/A;   LOWER EXTREMITY ANGIOGRAM Right 03/18/2020   Procedure: RIGHT ILIAC ARTERIOGRAM WITH AN ILIAC STENT;  Surgeon: Cephus Shelling, MD;  Location: [facility] OR;  Service: Vascular;  Laterality: Right;   MULTIPLE TOOTH EXTRACTIONS     PATCH ANGIOPLASTY Right 03/18/2020   Procedure: [facility]  ANGIOPLASTY using a XENOSURE BIOLOGIC PATCH OF THE PROFUNDA;  Surgeon: Cephus Shelling, MD;  Location: [facility] OR;  Service: Vascular;  Laterality: Right;   PERIPHERAL VASCULAR CATHETERIZATION N/A 07/16/2016   Procedure: Abdominal Aortogram w/Lower Extremity;  Surgeon: Larina Earthly, MD;  Location: [facility] INVASIVE CV LAB;  Service: Cardiovascular;  Laterality: N/A;   PERIPHERAL VASCULAR INTERVENTION Left 03/09/2020   Procedure: PERIPHERAL VASCULAR INTERVENTION;  Surgeon: Cephus Shelling, MD;  [facility];  Service: Cardiovascular;  Laterality: Left;  common and external illiac    ROTATOR CUFF REPAIR     right side, had torn ligaments as well   TUBAL LIGATION     VEIN BYPASS SURGERY     left leg   WRIST ARTHROCENTESIS     left - broke in 3 places so has bars and screws placed    MEDICATIONS: No current facility-administered medications for this encounter.    albuterol (PROVENTIL HFA;VENTOLIN HFA) 108 (90 BASE) MCG/ACT inhaler   albuterol (PROVENTIL) (2.5 MG/3ML) 0.083% nebulizer solution   alendronate (FOSAMAX) 70 MG tablet   allopurinol (ZYLOPRIM) 300 MG tablet   aspirin EC 81 MG tablet   atorvastatin (LIPITOR) 80 MG tablet   calcium carbonate (ANTACID) 500 MG chewable tablet   Cholecalciferol (VITAMIN D) 125 MCG (5000 UT) CAPS   clopidogrel (PLAVIX) 75 MG tablet   cyanocobalamin (VITAMIN B12) 1000 MCG tablet   EPIPEN 2-PAK 0.3 MG/0.3ML SOAJ injection   ezetimibe (ZETIA) 10 MG tablet   furosemide (LASIX) 40 MG tablet   lactulose (CHRONULAC) 10 GM/15ML solution   melatonin 5 MG TABS   Menthol-Methyl Salicylate (MUSCLE RUB) 10-15 % CREA   nitroGLYCERIN (NITROSTAT) 0.4 MG SL  tablet   Oxycodone HCl 10 MG TABS   pantoprazole (PROTONIX) 40 MG tablet   potassium chloride SA (KLOR-CON M20) 20 MEQ tablet   senna-docusate (SENOKOT-S) 8.6-50 MG tablet   spironolactone (ALDACTONE) 25 MG tablet   verapamil (CALAN-SR) 240 MG CR tablet    DOBUTamine (DOBUTREX) 1,000 mcg/mL in dextrose 5% 250 mL infusion

## 2023-03-13 ENCOUNTER — Ambulatory Visit (HOSPITAL_COMMUNITY)
Admission: RE | Admit: 2023-03-13 | Discharge: 2023-03-13 | Disposition: A | Discharge status: Home / Self Care | Payer: Medicare Other | Attending: Otolaryngology | Admitting: Otolaryngology

## 2023-03-13 ENCOUNTER — Ambulatory Visit (HOSPITAL_BASED_OUTPATIENT_CLINIC_OR_DEPARTMENT_OTHER): Payer: Medicare Other | Admitting: Medical

## 2023-03-13 ENCOUNTER — Other Ambulatory Visit: Payer: Self-pay

## 2023-03-13 ENCOUNTER — Encounter (HOSPITAL_COMMUNITY)
Admission: RE | Disposition: A | Discharge status: Home / Self Care | Payer: Medicare Other | Source: Home / Self Care | Attending: Otolaryngology

## 2023-03-13 ENCOUNTER — Encounter (HOSPITAL_COMMUNITY): Payer: Self-pay | Admitting: Otolaryngology

## 2023-03-13 ENCOUNTER — Ambulatory Visit (HOSPITAL_COMMUNITY): Payer: Medicare Other | Admitting: Medical

## 2023-03-13 DIAGNOSIS — F17210 Nicotine dependence, cigarettes, uncomplicated: Secondary | ICD-10-CM | POA: Diagnosis not present

## 2023-03-13 DIAGNOSIS — J449 Chronic obstructive pulmonary disease, unspecified: Secondary | ICD-10-CM | POA: Insufficient documentation

## 2023-03-13 DIAGNOSIS — R7303 Prediabetes: Secondary | ICD-10-CM | POA: Diagnosis not present

## 2023-03-13 DIAGNOSIS — I2510 Atherosclerotic heart disease of native coronary artery without angina pectoris: Secondary | ICD-10-CM

## 2023-03-13 DIAGNOSIS — J439 Emphysema, unspecified: Secondary | ICD-10-CM | POA: Diagnosis not present

## 2023-03-13 DIAGNOSIS — Z79899 Other long term (current) drug therapy: Secondary | ICD-10-CM | POA: Diagnosis not present

## 2023-03-13 DIAGNOSIS — C321 Malignant neoplasm of supraglottis: Secondary | ICD-10-CM | POA: Insufficient documentation

## 2023-03-13 DIAGNOSIS — J387 Other diseases of larynx: Secondary | ICD-10-CM

## 2023-03-13 DIAGNOSIS — I739 Peripheral vascular disease, unspecified: Secondary | ICD-10-CM | POA: Insufficient documentation

## 2023-03-13 DIAGNOSIS — K1370 Unspecified lesions of oral mucosa: Secondary | ICD-10-CM | POA: Diagnosis present

## 2023-03-13 DIAGNOSIS — Z7902 Long term (current) use of antithrombotics/antiplatelets: Secondary | ICD-10-CM | POA: Diagnosis not present

## 2023-03-13 DIAGNOSIS — R490 Dysphonia: Secondary | ICD-10-CM | POA: Diagnosis not present

## 2023-03-13 DIAGNOSIS — I509 Heart failure, unspecified: Secondary | ICD-10-CM

## 2023-03-13 DIAGNOSIS — I110 Hypertensive heart disease with heart failure: Secondary | ICD-10-CM

## 2023-03-13 DIAGNOSIS — C320 Malignant neoplasm of glottis: Secondary | ICD-10-CM | POA: Diagnosis not present

## 2023-03-13 DIAGNOSIS — I5030 Unspecified diastolic (congestive) heart failure: Secondary | ICD-10-CM | POA: Diagnosis not present

## 2023-03-13 DIAGNOSIS — K219 Gastro-esophageal reflux disease without esophagitis: Secondary | ICD-10-CM | POA: Insufficient documentation

## 2023-03-13 DIAGNOSIS — I5032 Chronic diastolic (congestive) heart failure: Secondary | ICD-10-CM | POA: Diagnosis not present

## 2023-03-13 DIAGNOSIS — J383 Other diseases of vocal cords: Secondary | ICD-10-CM | POA: Diagnosis not present

## 2023-03-13 HISTORY — DX: Malignant (primary) neoplasm, unspecified: C80.1

## 2023-03-13 HISTORY — PX: DIRECT LARYNGOSCOPY: SHX5326

## 2023-03-13 HISTORY — DX: Personal history of other medical treatment: Z92.89

## 2023-03-13 HISTORY — DX: Concussion with loss of consciousness status unknown, initial encounter: S06.0XAA

## 2023-03-13 LAB — BASIC METABOLIC PANEL
Anion gap: 10 (ref 5–15)
BUN: 13 mg/dL (ref 8–23)
CO2: 25 mmol/L (ref 22–32)
Calcium: 8.8 mg/dL — ABNORMAL LOW (ref 8.9–10.3)
Chloride: 100 mmol/L (ref 98–111)
Creatinine, Ser: 0.87 mg/dL (ref 0.44–1.00)
GFR, Estimated: >60 mL/min (ref >60)
Glucose, Bld: 83 mg/dL (ref 70–99)
Potassium: 3.6 mmol/L (ref 3.5–5.1)
Sodium: 135 mmol/L (ref 135–145)

## 2023-03-13 LAB — CBC
HCT: 41.8 % (ref 36.0–46.0)
Hemoglobin: 13.7 g/dL (ref 12.0–15.0)
MCH: 32.5 pg (ref 26.0–34.0)
MCHC: 32.8 g/dL (ref 30.0–36.0)
MCV: 99.3 fL (ref 80.0–100.0)
Platelets: 250 10*3/uL (ref 150–400)
RBC: 4.21 MIL/uL (ref 3.87–5.11)
RDW: 21.7 % — ABNORMAL HIGH (ref 11.5–15.5)
WBC: 7.1 10*3/uL (ref 4.0–10.5)
nRBC: 0.3 % — ABNORMAL HIGH (ref 0.0–0.2)

## 2023-03-13 SURGERY — LARYNGOSCOPY, DIRECT
Anesthesia: General | Site: Throat | Laterality: Bilateral

## 2023-03-13 MED ORDER — ROCURONIUM BROMIDE 10 MG/ML (PF) SYRINGE
PREFILLED_SYRINGE | INTRAVENOUS | Status: AC
  Filled 2023-03-13: qty 10

## 2023-03-13 MED ORDER — EPINEPHRINE HCL (NASAL) 0.1 % NA SOLN
NASAL | Status: AC
  Filled 2023-03-13: qty 30

## 2023-03-13 MED ORDER — 0.9 % SODIUM CHLORIDE (POUR BTL) OPTIME
TOPICAL | Status: DC | PRN
  Administered 2023-03-13: 1000 mL

## 2023-03-13 MED ORDER — LIDOCAINE 2% (20 MG/ML) 5 ML SYRINGE
INTRAMUSCULAR | Status: DC | PRN
  Administered 2023-03-13: 80 mg via INTRAVENOUS

## 2023-03-13 MED ORDER — CEFAZOLIN SODIUM-DEXTROSE 2-4 GM/100ML-% IV SOLN
2.0000 g | INTRAVENOUS | Status: AC
  Administered 2023-03-13: 2 g via INTRAVENOUS
  Filled 2023-03-13: qty 100

## 2023-03-13 MED ORDER — SUGAMMADEX SODIUM 200 MG/2ML IV SOLN
INTRAVENOUS | Status: DC | PRN
  Administered 2023-03-13: 200 mg via INTRAVENOUS

## 2023-03-13 MED ORDER — DEXAMETHASONE SODIUM PHOSPHATE 10 MG/ML IJ SOLN
INTRAMUSCULAR | Status: DC | PRN
  Administered 2023-03-13: 10 mg via INTRAVENOUS

## 2023-03-13 MED ORDER — ONDANSETRON HCL 4 MG/2ML IJ SOLN
INTRAMUSCULAR | Status: AC
  Filled 2023-03-13: qty 2

## 2023-03-13 MED ORDER — FENTANYL CITRATE (PF) 250 MCG/5ML IJ SOLN
INTRAMUSCULAR | Status: AC
  Filled 2023-03-13: qty 5

## 2023-03-13 MED ORDER — ROCURONIUM BROMIDE 10 MG/ML (PF) SYRINGE
PREFILLED_SYRINGE | INTRAVENOUS | Status: DC | PRN
  Administered 2023-03-13: 40 mg via INTRAVENOUS

## 2023-03-13 MED ORDER — DEXAMETHASONE SODIUM PHOSPHATE 10 MG/ML IJ SOLN
INTRAMUSCULAR | Status: AC
  Filled 2023-03-13: qty 1

## 2023-03-13 MED ORDER — ORAL CARE MOUTH RINSE: 15.0000 mL | Freq: Once | OROMUCOSAL | Status: AC

## 2023-03-13 MED ORDER — OXYMETAZOLINE HCL 0.05 % NA SOLN
NASAL | Status: AC
  Filled 2023-03-13: qty 30

## 2023-03-13 MED ORDER — LACTATED RINGERS IV SOLN: INTRAVENOUS | Status: DC

## 2023-03-13 MED ORDER — ALBUTEROL SULFATE HFA 108 (90 BASE) MCG/ACT IN AERS
INHALATION_SPRAY | RESPIRATORY_TRACT | Status: AC
  Filled 2023-03-13: qty 6.7

## 2023-03-13 MED ORDER — PROPOFOL 10 MG/ML IV BOLUS
INTRAVENOUS | Status: DC | PRN
  Administered 2023-03-13: 100 ug/kg/min via INTRAVENOUS
  Administered 2023-03-13: 80 mg via INTRAVENOUS
  Administered 2023-03-13: 50 mg via INTRAVENOUS

## 2023-03-13 MED ORDER — ONDANSETRON HCL 4 MG/2ML IJ SOLN
INTRAMUSCULAR | Status: DC | PRN
  Administered 2023-03-13: 4 mg via INTRAVENOUS

## 2023-03-13 MED ORDER — LIDOCAINE 2% (20 MG/ML) 5 ML SYRINGE
INTRAMUSCULAR | Status: AC
  Filled 2023-03-13: qty 5

## 2023-03-13 MED ORDER — CHLORHEXIDINE GLUCONATE 0.12 % MT SOLN
15.0000 mL | Freq: Once | OROMUCOSAL | Status: AC
  Administered 2023-03-13: 15 mL via OROMUCOSAL
  Filled 2023-03-13: qty 15

## 2023-03-13 MED ORDER — ALBUTEROL SULFATE HFA 108 (90 BASE) MCG/ACT IN AERS
INHALATION_SPRAY | RESPIRATORY_TRACT | Status: DC | PRN
  Administered 2023-03-13: 6 via RESPIRATORY_TRACT

## 2023-03-13 MED ORDER — FENTANYL CITRATE (PF) 250 MCG/5ML IJ SOLN
INTRAMUSCULAR | Status: DC | PRN
  Administered 2023-03-13: 50 ug via INTRAVENOUS

## 2023-03-13 SURGICAL SUPPLY — 28 items
BAG COUNTER SPONGE SURGICOUNT (BAG) ×1 IMPLANT
BAG SPNG CNTER NS LX DISP (BAG)
BALLN PULM 15 16.5 18X75 (BALLOONS)
BALLOON PULM 15 16.5 18X75 (BALLOONS) IMPLANT
CANISTER SUCT 3000ML PPV (MISCELLANEOUS) ×1 IMPLANT
CNTNR URN SCR LID CUP LEK RST (MISCELLANEOUS) IMPLANT
CONT SPEC 4OZ STRL OR WHT (MISCELLANEOUS)
COVER BACK TABLE 60X90IN (DRAPES) ×1 IMPLANT
COVER MAYO STAND STRL (DRAPES) ×1 IMPLANT
DRAPE HALF SHEET 40X57 (DRAPES) ×1 IMPLANT
GAUZE 4X4 16PLY ~~LOC~~+RFID DBL (SPONGE) ×1 IMPLANT
GLOVE BIO SURGEON STRL SZ7.5 (GLOVE) ×1 IMPLANT
GOWN STRL REUS W/ TWL LRG LVL3 (GOWN DISPOSABLE) IMPLANT
GOWN STRL REUS W/TWL LRG LVL3 (GOWN DISPOSABLE)
GUARD TEETH (MISCELLANEOUS) ×1 IMPLANT
KIT TURNOVER KIT B (KITS) ×1 IMPLANT
NDL HYPO 25GX1X1/2 BEV (NEEDLE) IMPLANT
NDL TRANS ORAL INJECTION (NEEDLE) IMPLANT
NEEDLE HYPO 25GX1X1/2 BEV (NEEDLE) IMPLANT
NEEDLE TRANS ORAL INJECTION (NEEDLE) IMPLANT
NS IRRIG 1000ML POUR BTL (IV SOLUTION) ×1 IMPLANT
PAD ARMBOARD 7.5X6 YLW CONV (MISCELLANEOUS) ×2 IMPLANT
PATTIES SURGICAL .5 X3 (DISPOSABLE) IMPLANT
POSITIONER HEAD DONUT 9IN (MISCELLANEOUS) IMPLANT
SOL ANTI FOG 6CC (MISCELLANEOUS) IMPLANT
SURGILUBE 2OZ TUBE FLIPTOP (MISCELLANEOUS) IMPLANT
TOWEL GREEN STERILE FF (TOWEL DISPOSABLE) ×2 IMPLANT
TUBE CONNECTING 12X1/4 (SUCTIONS) ×1 IMPLANT

## 2023-03-13 NOTE — Anesthesia Postprocedure Evaluation (Signed)
Anesthesia Post Note  Patient: Marie Johnson  Procedure(s) Performed: MICRO DIRECT LARYNGOSCOPY WITH BIOPSY (Bilateral: Throat)     Patient location during evaluation: PACU Anesthesia Type: General Level of consciousness: awake and alert Pain management: pain level controlled Vital Signs Assessment: post-procedure vital signs reviewed and stable Respiratory status: spontaneous breathing, nonlabored ventilation and respiratory function stable Cardiovascular status: blood pressure returned to baseline Postop Assessment: no apparent nausea or vomiting Anesthetic complications: no   There were no known notable events for this encounter.  Last Vitals:  Vitals:   03/13/23 1610 03/13/23 1615  BP:  (!) 170/89  Pulse: 85 86  Resp: 17 15  Temp:  36.9 C  SpO2: 98% 98%    Last Pain:  Vitals:   03/13/23 1615  TempSrc:   PainSc: 0-No pain                 Shanda Howells

## 2023-03-13 NOTE — Transfer of Care (Signed)
Immediate Anesthesia Transfer of Care Note  Patient: Marie Johnson  Procedure(s) Performed: MICRO DIRECT LARYNGOSCOPY WITH BIOPSY (Bilateral: Throat)  Patient Location: PACU  Anesthesia Type:General  Level of Consciousness: awake, alert , and oriented  Airway & Oxygen Therapy: Patient connected to face mask oxygen  Post-op Assessment: Report given to RN, Post -op Vital signs reviewed and stable, and Patient moving all extremities X 4  Post vital signs: Reviewed and stable  Last Vitals:  Vitals Value Taken Time  BP 157/72   Temp    Pulse 94 03/13/23 1533  Resp 17 03/13/23 1533  SpO2 98 % 03/13/23 1533  Vitals shown include unvalidated device data.  Last Pain:  Vitals:   03/13/23 1331  TempSrc:   PainSc: 7          Complications: There were no known notable events for this encounter.

## 2023-03-13 NOTE — Anesthesia Procedure Notes (Signed)
Procedure Name: Intubation Date/Time: 03/13/2023 2:59 PM  Performed by: Sharyn Dross, CRNAPre-anesthesia Checklist: Patient identified, Emergency Drugs available, Suction available and Patient being monitored Patient Re-evaluated:Patient Re-evaluated prior to induction Oxygen Delivery Method: Circle system utilized Preoxygenation: Pre-oxygenation with 100% oxygen Induction Type: IV induction Ventilation: Mask ventilation without difficulty and Oral airway inserted - appropriate to patient size Laryngoscope Size: 4 and Mac Grade View: Grade I Tube type: Oral Tube size: 6.0 mm Number of attempts: 1 Airway Equipment and Method: Stylet and Oral airway Placement Confirmation: ETT inserted through vocal cords under direct vision, positive ETCO2 and breath sounds checked- equal and bilateral Secured at: 22 cm Tube secured with: Tape Dental Injury: Teeth and Oropharynx as per pre-operative assessment

## 2023-03-13 NOTE — Op Note (Signed)
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   COMPLICATIONS:  None.   INDICATIONS:  The patient is a 72 year old female with a history of hoarseness found to be associated with left sulcus vocalis.  In follow-up, an irregular lesion was noted on the laryngeal surface of the epiglottis.  She presents to the operating room for surgical management.   FINDINGS:  Round, firm lesion of laryngeal surface of epiglottis.  Left true vocal fold with linear sulcus vocalis with some inflammation.   DESCRIPTION OF PROCEDURE:  The patient was identified in the holding room, informed consent having been obtained including discussion of risks, benefits and alternatives, the patient was brought to the operative suite and put the operative table in the supine position.  Anesthesia was induced and the patient was intubated by the Anesthesia team without difficulty.  The eyes were taped closed and bed was turned 90 degrees from anesthesia.  The patient was given intravenous steroids during the  case.  A damp gauze was placed over the upper gum and a Dedo laryngoscope was placed into the supraglottic position and suspended to Mayo stand using the Lewy arm.  Photodocumentation was performed with the zero degree telescope.  Biopsies were taken from the laryngeal surface of the epiglottis using cup forceps.  Likewise, a couple of biopsies were taken from the left true vocal fold.   Specimens were passed to nursing for pathology.  The laryngoscope was then taking out of suspension and used to evaluate the various areas of the pharynx and larynx including the vallecula, pyriform sinuses, and post-cricoid space.  The scope was then removed from the patient's mouth while suctioning the airway.  The gauze was removed and the patient was  turned back to Anesthesia for wakeup, was extubated, and taken to the recovery room in stable condition.

## 2023-03-13 NOTE — Brief Op Note (Signed)
03/13/2023  3:21 PM  PATIENT:  Marie Johnson  72 y.o. female  PRE-OPERATIVE DIAGNOSIS:  Lesion of epiglottis  POST-OPERATIVE DIAGNOSIS:  Lesion of epiglottis  PROCEDURE:  Procedure(s): MICRO DIRECT LARYNGOSCOPY WITH BIOPSY (Bilateral)  SURGEON:  Surgeon(s) and Role:    * Christia Reading, MD - Primary  PHYSICIAN ASSISTANT:   ASSISTANTS: none   ANESTHESIA:   general  EBL:  Minimal   BLOOD ADMINISTERED:none  DRAINS: none   LOCAL MEDICATIONS USED:  NONE  SPECIMEN:  Source of Specimen:  Laryngeal surface of epiglottis, left true vocal fold  DISPOSITION OF SPECIMEN:  PATHOLOGY  COUNTS:  YES  TOURNIQUET:  * No tourniquets in log *  DICTATION: .Note written in EPIC  PLAN OF CARE: Discharge to home after PACU  PATIENT DISPOSITION:  PACU - hemodynamically stable.   Delay start of Pharmacological VTE agent (>24hrs) due to surgical blood loss or risk of bleeding: no

## 2023-03-13 NOTE — H&P (Signed)
Marie Johnson is an 72 y.o. female.   Chief Complaint: Epiglottis lesion HPI: 72 year old female recently found to have an irregular area on her epiglottis by fiberoptic exam.  Past Medical History:  Diagnosis Date   AAA (abdominal aortic aneurysm) (HCC)    3.4 cm   Anemia    Anxiety    Aortic atherosclerosis (HCC)    Arthritis    Asthma    CAD (coronary artery disease)    a. Nonobstructive CAD by cath 07/17/13.   Cancer [facility])    Carotid artery disease (HCC)    Carotid US 8/22: Bilateral ICA 1-39; right subclavian stenosis; right thyroid nodule   CHD (congenital heart disease)    pt unaware???   Chest pain    a. Adm 10-07/2013 - CTA neg for PE/dissection, cath with nonobstructive disease, CP ?due to uncontrolled HTN vs coronary vasospasm.   CHF (congestive heart failure) (HCC)    Closed head injury with concussion    COPD (chronic obstructive pulmonary disease) (HCC)    Coronary artery disease    Echocardiogram    Echocardiogram 06/2019: EF 60-65, impaired relaxation (Gr 1 DD), mild MAC, mild MR, mild to mod aortic valve sclerosis (no AS), trivial TR, mild LAE, normal RVSF, RVSP 30.7 (mildly elevated)   GERD (gastroesophageal reflux disease)    History of blood transfusion    childbirth, Hysterectomy   Hyperlipidemia    Hypertension    Neuropathy    OA (osteoarthritis)    Osteoporosis    PAD (peripheral artery disease) (HCC)    a. s/p L fem-pop bypass b. recent evaluation at [facility], unamenable to intervention   PAD (peripheral artery disease) (HCC)    Palpitations 02/22/2016   Pneumonia    Pre-diabetes    Premature ventricular contraction    PUD (peptic ulcer disease)    PVD (peripheral vascular disease) (HCC) 07/17/2013   Thoracic aortic aneurysm (HCC)    Chest/aorta CTA 04/2022: 14 x 8 focal saccular aneurysm versus penetrating atherosclerotic ulcer involving left side of proximal portion of transverse aortic arch; descending thoracic aorta and proximal abdominal aorta  aneurysm 4.4 cm; aortic atherosclerosis; emphysema   Tobacco abuse    Wears dentures    Wears glasses     Past Surgical History:  Procedure Laterality Date   ABDOMINAL AORTOGRAM W/LOWER EXTREMITY N/A 03/09/2020   Procedure: ABDOMINAL AORTOGRAM W/LOWER EXTREMITY;  Surgeon: Cephus Shelling, MD;  Location: [facility] INVASIVE CV LAB;  Service: Cardiovascular;  Laterality: N/A;   ABDOMINAL AORTOGRAM W/LOWER EXTREMITY N/A 07/26/2022   Procedure: ABDOMINAL AORTOGRAM W/LOWER EXTREMITY;  Surgeon: Cephus Shelling, MD;  Location: [facility] CV [facility];  Service: Cardiovascular;  Laterality: N/A;   ABDOMINAL AORTOGRAM W/LOWER EXTREMITY N/A 09/20/2022   Procedure: ABDOMINAL AORTOGRAM W/LOWER EXTREMITY;  Surgeon: Cephus Shelling, MD;  Location: [facility];  Service: Cardiovascular;  Laterality: N/A;   ABDOMINAL HYSTERECTOMY     BREAST SURGERY     left cyst excision x 2   CARDIAC CATHETERIZATION     several years ago, nonobstructive, "50-60% blockages"   ENDARTERECTOMY FEMORAL Right 03/18/2020   Procedure: ENDARTERECTOMY FEMORAL WITH PROFUNDAPLASTY;  Surgeon: Cephus Shelling, MD;  Location: [facility] OR;  Service: Vascular;  Laterality: Right;   FEMORAL-POPLITEAL BYPASS GRAFT Right 03/18/2020   Procedure: BYPASS GRAFT FEMORAL-POPLITEAL ARTERY using GORE PROPATEN VASCULAR GRAFT;  Surgeon: Cephus Shelling, MD;  Location: [facility] OR;  Service: Vascular;  Laterality: Right;   HIP SURGERY     left -  bars and screws placed   HIP SURGERY     INSERTION OF ILIAC STENT Right 03/18/2020   Procedure: INSERTION OF GORE VIABAHN  ILIAC STENT;  Surgeon: Cephus Shelling, MD;  Location: [facility] OR;  Service: Vascular;  Laterality: Right;   LEFT HEART CATHETERIZATION WITH CORONARY ANGIOGRAM N/A 07/17/2013   Procedure: LEFT HEART CATHETERIZATION WITH CORONARY ANGIOGRAM;  Surgeon: Laurey Morale, MD;  Location: [facility] CATH LAB;  Service: Cardiovascular;  Laterality: N/A;   LOWER EXTREMITY ANGIOGRAM Right 03/18/2020    Procedure: RIGHT ILIAC ARTERIOGRAM WITH AN ILIAC STENT;  Surgeon: Cephus Shelling, MD;  Location: [facility] OR;  Service: Vascular;  Laterality: Right;   MULTIPLE TOOTH EXTRACTIONS     PATCH ANGIOPLASTY Right 03/18/2020   Procedure: PATCH ANGIOPLASTY using a Livia Snellen BIOLOGIC PATCH OF THE PROFUNDA;  Surgeon: Cephus Shelling, MD;  Location: [facility] OR;  Service: Vascular;  Laterality: Right;   PERIPHERAL VASCULAR CATHETERIZATION N/A 07/16/2016   Procedure: Abdominal Aortogram w/Lower Extremity;  Surgeon: Larina Earthly, MD;  Location[facility] INVASIVE CV LAB;  Service: Cardiovascular;  Laterality: N/A;   PERIPHERAL VASCULAR INTERVENTION Left 03/09/2020   Procedure: PERIPHERAL VASCULAR INTERVENTION;  Surgeon: Cephus Shelling, MD;  Location: [facility];  Service: Cardiovascular;  Laterality: Left;  common and external illiac    ROTATOR CUFF REPAIR     right side, had torn ligaments as well   TUBAL LIGATION     VEIN BYPASS SURGERY     left leg   WRIST ARTHROCENTESIS     left - broke in 3 places so has bars and screws placed    Family History  Problem Relation Age of Onset   Cervical cancer Mother    Heart disease Mother        MI in 32s   Liver cancer Father    Heart disease Father    Lung cancer Maternal Aunt    Prostate cancer Maternal Uncle        met. anal   Breast cancer Maternal Grandmother    Lung cancer Maternal Aunt    Lung cancer Maternal Aunt    Stomach cancer Maternal Aunt    Colon cancer Maternal Aunt    Lung cancer Maternal Uncle    Social History:  reports that she has been smoking cigarettes. She has a 28.50 pack-year smoking history. She has been exposed to tobacco smoke. She has never used smokeless tobacco. She reports that she does not drink alcohol and does not use drugs.  Allergies:  Allergies  Allergen Reactions   Bee Venom Anaphylaxis and Other (See Comments)    Welts, also    Insect bites- Welts, also   Insect Extract Anaphylaxis and Other (See  Comments)    Insect bites- Welts, also   Farxiga [Dapagliflozin] Nausea And Vomiting   Lyrica [Pregabalin] Nausea And Vomiting   Neurontin [Gabapentin] Nausea And Vomiting and Other (See Comments)    Hypertension/dizziness   Nsaids Nausea Only and Other (See Comments)    Patient is not suppose to have this class of medication (liver)   Latex Rash and Other (See Comments)    Medications Prior to Admission  Medication Sig Dispense Refill   albuterol (PROVENTIL HFA;VENTOLIN HFA) 108 (90 BASE) MCG/ACT inhaler Inhale 2 puffs into the lungs every 6 (six) hours as needed for wheezing or shortness of breath.     alendronate (FOSAMAX) 70 MG tablet Take 70 mg by mouth every Wednesday.     allopurinol (ZYLOPRIM) 300  MG tablet Take 300 mg by mouth every evening.     aspirin EC 81 MG tablet Take 81 mg by mouth in the morning.     atorvastatin (LIPITOR) 80 MG tablet Take 80 mg by mouth at bedtime.     calcium carbonate (ANTACID) 500 MG chewable tablet Chew 1 tablet by mouth 2 (two) times daily as needed for heartburn or indigestion.     Cholecalciferol (VITAMIN D) 125 MCG (5000 UT) CAPS Take 5,000 Units by mouth in the morning.     clopidogrel (PLAVIX) 75 MG tablet Take 1 tablet (75 mg total) by mouth daily. 30 tablet 11   cyanocobalamin (VITAMIN B12) 1000 MCG tablet Take 1,000 mcg by mouth daily.     EPIPEN 2-PAK 0.3 MG/0.3ML SOAJ injection Inject 0.3 mg into the muscle daily as needed for anaphylaxis. Use as directed as needed for allergies.     ezetimibe (ZETIA) 10 MG tablet TAKE 1 TABLET BY MOUTH EVERY DAY (Patient taking differently: Take 10 mg by mouth at bedtime.) 90 tablet 2   furosemide (LASIX) 40 MG tablet Take 1 tablet (40 mg total) by mouth daily. 90 tablet 3   lactulose (CHRONULAC) 10 GM/15ML solution Take 20 g by mouth 2 (two) times daily.     melatonin 5 MG TABS Take 5 mg by mouth at bedtime.     Menthol-Methyl Salicylate (MUSCLE RUB) 10-15 % CREA Apply 1 application topically daily as  needed (for arthritis pain).     Oxycodone HCl 10 MG TABS Take 1 tablet (10 mg total) by mouth 4 (four) times daily as needed (for pain). 20 tablet 0   pantoprazole (PROTONIX) 40 MG tablet Take 40 mg by mouth 2 (two) times daily.     potassium chloride SA (KLOR-CON M20) 20 MEQ tablet Take 1 tablet (20 mEq total) by mouth daily. 90 tablet 1   senna-docusate (SENOKOT-S) 8.6-50 MG tablet Take 2 tablets by mouth at bedtime.     spironolactone (ALDACTONE) 25 MG tablet Take 1 tablet (25 mg total) by mouth daily. 90 tablet 3   verapamil (CALAN-SR) 240 MG CR tablet TAKE 1 TABLET BY MOUTH EVERYDAY AT BEDTIME 90 tablet 2   albuterol (PROVENTIL) (2.5 MG/3ML) 0.083% nebulizer solution Take 2.5 mg by nebulization every 6 (six) hours as needed for wheezing or shortness of breath.     nitroGLYCERIN (NITROSTAT) 0.4 MG SL tablet Place 1 tablet (0.4 mg total) under the tongue every 5 (five) minutes x 3 doses as needed for chest pain. 25 tablet 4    No results found for this or any previous visit (from the past 48 hour(s)). No results found.  Review of Systems  All other systems reviewed and are negative.   Blood pressure (!) 161/75, pulse 91, temperature 98 F (36.7 C), temperature source Oral, resp. rate 20, height 5\' 8"  (1.727 m), weight 56.7 kg, SpO2 99 %. Physical Exam Constitutional:      Appearance: Normal appearance. She is normal weight.  HENT:     Head: Normocephalic and atraumatic.     Right Ear: External ear normal.     Left Ear: External ear normal.     Nose: Nose normal.     Mouth/Throat:     Mouth: Mucous membranes are moist.     Pharynx: Oropharynx is clear.  Eyes:     Extraocular Movements: Extraocular movements intact.     Conjunctiva/sclera: Conjunctivae normal.     Pupils: Pupils are equal, round, and reactive to light.  Cardiovascular:     Rate and Rhythm: Normal rate.  Pulmonary:     Effort: Pulmonary effort is normal.  Musculoskeletal:     Cervical back: Normal range of  motion.  Skin:    General: Skin is warm and dry.  Neurological:     General: No focal deficit present.     Mental Status: She is alert and oriented to person, place, and time.  Psychiatric:        Mood and Affect: Mood normal.        Behavior: Behavior normal.        Thought Content: Thought content normal.        Judgment: Judgment normal.      Assessment/Plan Epiglottis lesion  To OR for SMDL with biopsy.  Christia Reading, MD 03/13/2023, 2:09 PM

## 2023-03-14 ENCOUNTER — Encounter (HOSPITAL_COMMUNITY): Payer: Self-pay | Admitting: Otolaryngology

## 2023-03-15 LAB — SURGICAL PATHOLOGY

## 2023-03-25 DIAGNOSIS — M25552 Pain in left hip: Secondary | ICD-10-CM | POA: Diagnosis not present

## 2023-03-25 DIAGNOSIS — I739 Peripheral vascular disease, unspecified: Secondary | ICD-10-CM | POA: Diagnosis not present

## 2023-03-25 DIAGNOSIS — G8929 Other chronic pain: Secondary | ICD-10-CM | POA: Diagnosis not present

## 2023-03-25 DIAGNOSIS — M25551 Pain in right hip: Secondary | ICD-10-CM | POA: Diagnosis not present

## 2023-03-29 ENCOUNTER — Other Ambulatory Visit: Payer: Self-pay | Admitting: *Deleted

## 2023-03-29 DIAGNOSIS — I493 Ventricular premature depolarization: Secondary | ICD-10-CM

## 2023-03-29 DIAGNOSIS — E7849 Other hyperlipidemia: Secondary | ICD-10-CM

## 2023-03-29 DIAGNOSIS — I10 Essential (primary) hypertension: Secondary | ICD-10-CM

## 2023-03-29 DIAGNOSIS — I739 Peripheral vascular disease, unspecified: Secondary | ICD-10-CM

## 2023-03-29 DIAGNOSIS — M791 Myalgia, unspecified site: Secondary | ICD-10-CM

## 2023-03-29 DIAGNOSIS — I2510 Atherosclerotic heart disease of native coronary artery without angina pectoris: Secondary | ICD-10-CM

## 2023-03-29 MED ORDER — EZETIMIBE 10 MG PO TABS: 10.0000 mg | ORAL_TABLET | Freq: Every day | ORAL | 2 refills | Status: DC

## 2023-03-29 MED ORDER — VERAPAMIL HCL ER 240 MG PO TBCR
240.0000 mg | EXTENDED_RELEASE_TABLET | Freq: Every day | ORAL | 2 refills | Status: DC
Start: 2023-03-29 — End: 2024-02-11

## 2023-04-01 NOTE — Progress Notes (Signed)
Cardiology Office Note:   Date:  04/04/2023  ID:  Marie Johnson, DOB 1951/07/31, MRN [id number] PCP: Ailene Ravel, MD  [facility] Providers Cardiologist:  Meriam Sprague, MD Cardiology APP:  Beatrice Lecher, PA-C  Electrophysiologist:  Hillis Range, MD (Inactive)    History of Present Illness:   Marie Johnson is a 72 y.o. female with history of nonobstructive CAD (per 2014 LHC), diastolic CHF, PAD (s/p L fem-pop bypass, L CIA and EIA stenting 02/2020, R CIA stent, RFA endarterectomy and right fem-pop bypass), AAA, COPD, hypertension, hyperlipidemia, tobacco use. Patient last seen in clinic by Dr. Shari Prows on 08/16/22. At that time, patient was s/p hospitalization for occluded popliteal bypass and underwent left femoral-popliteal bypass percutaneous mechanical thrombectomy and Angioplasty and stent of left common femoral to above-knee popliteal bypass into the native above-knee popliteal artery (6 mm x 25 cm Viabahn and 6 mm x 15 cm Viabahn all postdilated with a 5 mm Sterling) with Dr. Chestine Spore. Patient reported having cut back to 4-5 cigarettes per day at that visit. She was continued on Lasix 40mg  daily along with Spironolactone 25mg  daily. Not on SGLT2 with prior intolerance.   Prior to today's visit, patient underwent suspended microdirect laryngoscopy with biopsy on 6/26 with Dr. Jenne Pane. Biopsy was concerning for cancer and patient has a PET study pending. She continues to smoke about 1/2 pack per day but is still working to quit. She does report chronic dyspnea, slightly worse over the past several months with weather change and she finds that she is using her albuterol inhaler as much as 4 times per day. She was placed on Spiriva and believes this has helped a little with dyspnea. Regarding HFpEF symptoms, leg swelling is stable on Lasix and Spironolactone. She still has some orthopnea but denies changes to number of pillows at night (3).  Her exertional tolerance remains low due  to dyspnea but is not acutely changed and she denies chest pain, palpitations, melena, hematuria, hemoptysis, diaphoresis, weakness, presyncope, syncope, and PND.     Studies Reviewed:    EKG:        No ECG today.  Risk Assessment/Calculations:     HYPERTENSION CONTROL Vitals:   04/04/23 0814 04/04/23 0845  BP: (!) 140/82 (!) 142/80    The patient's blood pressure is elevated above target today.  In order to address the patient's elevated BP: Blood pressure will be monitored at home to determine if medication changes need to be made.           Physical Exam:   VS:  BP (!) 142/80 (BP Location: Right Arm, Patient Position: Sitting)   Pulse 86   Ht 5\' 7"  (1.702 m)   Wt 128 lb 6.4 oz (58.2 kg)   SpO2 (!) 86% Comment: Provider made aware  BMI 20.11 kg/m    Wt Readings from Last 3 Encounters:  04/04/23 128 lb 6.4 oz (58.2 kg)  03/13/23 125 lb (56.7 kg)  10/23/22 126 lb 3.2 oz (57.2 kg)     Physical Exam Vitals reviewed.  Constitutional:      Appearance: Normal appearance.  HENT:     Head: Normocephalic.  Eyes:     Pupils: Pupils are equal, round, and reactive to light.  Cardiovascular:     Rate and Rhythm: Normal rate and regular rhythm.     Pulses: Normal pulses.     Comments: Isolated ectopic beats on auscultation Pulmonary:     Effort: Pulmonary effort is normal.  No respiratory distress.     Breath sounds: Wheezing (faint upper lobes bilaterally) present.  Abdominal:     General: Abdomen is flat.     Palpations: Abdomen is soft.  Musculoskeletal:     Right lower leg: Edema (trace-1+) present.     Left lower leg: Edema (trace-1+) present.  Skin:    General: Skin is warm and dry.     Capillary Refill: Capillary refill takes 2 to 3 seconds.  Neurological:     General: No focal deficit present.     Mental Status: She is alert and oriented to person, place, and time.  Psychiatric:        Mood and Affect: Mood normal.        Behavior: Behavior normal.         Thought Content: Thought content normal.        Judgment: Judgment normal.      ASSESSMENT AND PLAN:    Chronic diastolic CHF  Last TTE in June of 2021 showed LVEF 55-60% with mild LVH, aortic sclerosis. Reports class II-III symptoms. No evidence of excess volume on exam today.  Continue Lasix 40mg  daily Continue Spironolactone 25mg   No SGLT2. Previously tried and intolerant.   Non-obstructive CAD  Patient with non-obstructive disease found in 2014 on LHC. Had reassuring myoview in 2022 which was low risk. Continues to report no anginal symptoms.   Continue ASA 81mg , Lipitor 80mg , Zetia 10mg  Also on Plavix 75mg  for PAD  PVCs  Patient without significant symptoms of PVCs, will continue with Verapamil 240mg  once daily.   PAD  S/p left femoral-popliteal bypass percutaneous mechanical thrombectomy and Angioplasty and stent of left common femoral to above-knee popliteal bypass into the native above-knee popliteal artery (6 mm x 25 cm Viabahn and 6 mm x 15 cm Viabahn all postdilated with a 5 mm Sterling) with Dr. Chestine Spore on 07/26/22. She also underwent US guided aortogram with catheter, lower left extremity arteriogram with Dr. Chestine Spore in January, no intervention necessary at that time.   Management per vascular surgery Continue with ASA 81mg , Plavix 75mg , and cholesterol management as below.  Thoracic Aortic Aneurysm AAA  Patient with CTA aorta in August of 2023 that showed 4x8 mm focal saccular aneurysm or penetrating atherosclerotic ulcer on left side of proximal portion of transverse aortic arch with stable 4.4 cm focal fusiform descending aortic aneurysm.   Continue to follow with vascular surgery Will also continue ASA 81mg , Plavix 75mg , lipid management as below.  HTN  BP today is elevated today, 140/82, 142/80. Patient has not taken her BP medication today and reports normal readings at home <130/80 mmHg.  Continue Verapamil 240mg  once daily Continue Spironolactone  25mg  once daily. Patient will keep BP log over the next week and we will follow up on readings. If elevated, would need additional therapy.   HLD  Last LDL 59 (11/27/22), at goal.  Continue Lipitor 80mg , Zetia 10mg . Continue low fat/healthy fat diet  Tobacco use Dyspnea  Patient with over 50 pack year history, continues to smoke 1/2 pack per day. Physical exam and hx concerning for progressing COPD. O2 readings in clinic today ~88%.  Check annual CT chest lung for cancer screening. Given lower O2 saturation and increased albuterol use, will place pulmonology referral.   Mild carotid artery disease  RICA and LICA 1-39% as of August 2022. Will order repeat carotid duplex today.   Continue ASA 81mg , Plavix 75mg , Lipitor 80mg , Zetia 10mg   Signed, Perlie Gold, PA-C

## 2023-04-04 ENCOUNTER — Encounter: Payer: Self-pay | Admitting: Cardiology

## 2023-04-04 ENCOUNTER — Ambulatory Visit: Payer: Medicare Other | Attending: Cardiology | Admitting: Cardiology

## 2023-04-04 VITALS — BP 142/80 | HR 86 | Ht 67.0 in | Wt 128.4 lb

## 2023-04-04 DIAGNOSIS — Z720 Tobacco use: Secondary | ICD-10-CM

## 2023-04-04 DIAGNOSIS — I6523 Occlusion and stenosis of bilateral carotid arteries: Secondary | ICD-10-CM | POA: Diagnosis not present

## 2023-04-04 DIAGNOSIS — I739 Peripheral vascular disease, unspecified: Secondary | ICD-10-CM | POA: Diagnosis not present

## 2023-04-04 DIAGNOSIS — J42 Unspecified chronic bronchitis: Secondary | ICD-10-CM

## 2023-04-04 NOTE — Patient Instructions (Signed)
Medication Instructions:  Your physician recommends that you continue on your current medications as directed. Please refer to the Current Medication list given to you today.  *If you need a refill on your cardiac medications before your next appointment, please call your pharmacy*   Testing/Procedures: [place] of carotid arteries Your physician has requested that you have a carotid duplex. This test is an ultrasound of the carotid arteries in your neck. It looks at blood flow through these arteries that supply the brain with blood. Allow one hour for this exam. There are no restrictions or special instructions.   Lung CT Non-Cardiac CT scanning, (CAT scanning), is a noninvasive, special x-ray that produces cross-sectional images of the body using x-rays and a computer. CT scans help physicians diagnose and treat medical conditions. For some CT exams, a contrast material is used to enhance visibility in the area of the body being studied. CT scans provide greater clarity and reveal more details than regular x-ray exams.    Follow-Up: At [facility], you and your health needs are our priority.  As part of our continuing mission to provide you with exceptional heart care, we have created designated Provider Care Teams.  These Care Teams include your primary Cardiologist (physician) and Advanced Practice Providers (APPs -  Physician Assistants and Nurse Practitioners) who all work together to provide you with the care you need, when you need it.  We recommend signing up for the patient portal called "MyChart".  Sign up information is provided on this After Visit Summary.  MyChart is used to connect with patients for Virtual Visits (Telemedicine).  Patients are able to view lab/test results, encounter notes, upcoming appointments, etc.  Non-urgent messages can be sent to your provider as well.   To learn more about what you can do with MyChart, go to [email or web address].    Your next  appointment:   6 month(s)  Provider:   Perlie Gold

## 2023-04-05 ENCOUNTER — Other Ambulatory Visit (HOSPITAL_COMMUNITY): Payer: Self-pay | Admitting: Otolaryngology

## 2023-04-05 DIAGNOSIS — C329 Malignant neoplasm of larynx, unspecified: Secondary | ICD-10-CM

## 2023-04-06 ENCOUNTER — Inpatient Hospital Stay (HOSPITAL_COMMUNITY)
Admission: EM | Admit: 2023-04-06 | Discharge: 2023-04-11 | DRG: 191 | Disposition: A | Discharge status: Home / Self Care | Payer: Medicare Other | Attending: Internal Medicine | Admitting: Internal Medicine

## 2023-04-06 ENCOUNTER — Other Ambulatory Visit: Payer: Self-pay

## 2023-04-06 ENCOUNTER — Emergency Department (HOSPITAL_COMMUNITY): Payer: Medicare Other

## 2023-04-06 DIAGNOSIS — I2510 Atherosclerotic heart disease of native coronary artery without angina pectoris: Secondary | ICD-10-CM | POA: Diagnosis present

## 2023-04-06 DIAGNOSIS — Z7902 Long term (current) use of antithrombotics/antiplatelets: Secondary | ICD-10-CM

## 2023-04-06 DIAGNOSIS — Z7983 Long term (current) use of bisphosphonates: Secondary | ICD-10-CM | POA: Diagnosis not present

## 2023-04-06 DIAGNOSIS — I714 Abdominal aortic aneurysm, without rupture, unspecified: Secondary | ICD-10-CM | POA: Diagnosis present

## 2023-04-06 DIAGNOSIS — G629 Polyneuropathy, unspecified: Secondary | ICD-10-CM | POA: Diagnosis not present

## 2023-04-06 DIAGNOSIS — Z8709 Personal history of other diseases of the respiratory system: Secondary | ICD-10-CM | POA: Diagnosis not present

## 2023-04-06 DIAGNOSIS — Z8521 Personal history of malignant neoplasm of larynx: Secondary | ICD-10-CM | POA: Diagnosis not present

## 2023-04-06 DIAGNOSIS — Z7982 Long term (current) use of aspirin: Secondary | ICD-10-CM | POA: Diagnosis not present

## 2023-04-06 DIAGNOSIS — Z886 Allergy status to analgesic agent status: Secondary | ICD-10-CM

## 2023-04-06 DIAGNOSIS — J439 Emphysema, unspecified: Secondary | ICD-10-CM | POA: Diagnosis present

## 2023-04-06 DIAGNOSIS — Z91030 Bee allergy status: Secondary | ICD-10-CM

## 2023-04-06 DIAGNOSIS — I358 Other nonrheumatic aortic valve disorders: Secondary | ICD-10-CM | POA: Diagnosis not present

## 2023-04-06 DIAGNOSIS — J441 Chronic obstructive pulmonary disease with (acute) exacerbation: Secondary | ICD-10-CM | POA: Diagnosis not present

## 2023-04-06 DIAGNOSIS — I10 Essential (primary) hypertension: Secondary | ICD-10-CM | POA: Diagnosis not present

## 2023-04-06 DIAGNOSIS — Z801 Family history of malignant neoplasm of trachea, bronchus and lung: Secondary | ICD-10-CM

## 2023-04-06 DIAGNOSIS — R0602 Shortness of breath: Secondary | ICD-10-CM | POA: Diagnosis not present

## 2023-04-06 DIAGNOSIS — I214 Non-ST elevation (NSTEMI) myocardial infarction: Secondary | ICD-10-CM

## 2023-04-06 DIAGNOSIS — R0600 Dyspnea, unspecified: Principal | ICD-10-CM

## 2023-04-06 DIAGNOSIS — R0902 Hypoxemia: Secondary | ICD-10-CM | POA: Diagnosis not present

## 2023-04-06 DIAGNOSIS — I77810 Thoracic aortic ectasia: Secondary | ICD-10-CM | POA: Diagnosis not present

## 2023-04-06 DIAGNOSIS — I4891 Unspecified atrial fibrillation: Secondary | ICD-10-CM | POA: Diagnosis not present

## 2023-04-06 DIAGNOSIS — I517 Cardiomegaly: Secondary | ICD-10-CM | POA: Diagnosis not present

## 2023-04-06 DIAGNOSIS — M1990 Unspecified osteoarthritis, unspecified site: Secondary | ICD-10-CM | POA: Diagnosis present

## 2023-04-06 DIAGNOSIS — Z8249 Family history of ischemic heart disease and other diseases of the circulatory system: Secondary | ICD-10-CM

## 2023-04-06 DIAGNOSIS — Z8049 Family history of malignant neoplasm of other genital organs: Secondary | ICD-10-CM

## 2023-04-06 DIAGNOSIS — I110 Hypertensive heart disease with heart failure: Secondary | ICD-10-CM | POA: Diagnosis present

## 2023-04-06 DIAGNOSIS — Z1152 Encounter for screening for COVID-19: Secondary | ICD-10-CM

## 2023-04-06 DIAGNOSIS — J42 Unspecified chronic bronchitis: Secondary | ICD-10-CM

## 2023-04-06 DIAGNOSIS — Z888 Allergy status to other drugs, medicaments and biological substances status: Secondary | ICD-10-CM

## 2023-04-06 DIAGNOSIS — M810 Age-related osteoporosis without current pathological fracture: Secondary | ICD-10-CM | POA: Diagnosis present

## 2023-04-06 DIAGNOSIS — Z803 Family history of malignant neoplasm of breast: Secondary | ICD-10-CM | POA: Diagnosis not present

## 2023-04-06 DIAGNOSIS — I5032 Chronic diastolic (congestive) heart failure: Secondary | ICD-10-CM | POA: Diagnosis present

## 2023-04-06 DIAGNOSIS — Z800 Family history of malignant neoplasm of digestive organs: Secondary | ICD-10-CM

## 2023-04-06 DIAGNOSIS — I5A Non-ischemic myocardial injury (non-traumatic): Secondary | ICD-10-CM | POA: Diagnosis not present

## 2023-04-06 DIAGNOSIS — J449 Chronic obstructive pulmonary disease, unspecified: Secondary | ICD-10-CM | POA: Diagnosis present

## 2023-04-06 DIAGNOSIS — Z91040 Latex allergy status: Secondary | ICD-10-CM

## 2023-04-06 DIAGNOSIS — I739 Peripheral vascular disease, unspecified: Secondary | ICD-10-CM | POA: Diagnosis present

## 2023-04-06 DIAGNOSIS — R6889 Other general symptoms and signs: Secondary | ICD-10-CM | POA: Diagnosis not present

## 2023-04-06 DIAGNOSIS — F17210 Nicotine dependence, cigarettes, uncomplicated: Secondary | ICD-10-CM | POA: Diagnosis not present

## 2023-04-06 DIAGNOSIS — Z8711 Personal history of peptic ulcer disease: Secondary | ICD-10-CM

## 2023-04-06 DIAGNOSIS — I2489 Other forms of acute ischemic heart disease: Secondary | ICD-10-CM | POA: Diagnosis not present

## 2023-04-06 DIAGNOSIS — I700 Atherosclerosis of aorta: Secondary | ICD-10-CM | POA: Diagnosis not present

## 2023-04-06 DIAGNOSIS — Z79899 Other long term (current) drug therapy: Secondary | ICD-10-CM

## 2023-04-06 DIAGNOSIS — R7303 Prediabetes: Secondary | ICD-10-CM | POA: Diagnosis present

## 2023-04-06 DIAGNOSIS — R064 Hyperventilation: Secondary | ICD-10-CM | POA: Diagnosis not present

## 2023-04-06 DIAGNOSIS — Z743 Need for continuous supervision: Secondary | ICD-10-CM | POA: Diagnosis not present

## 2023-04-06 DIAGNOSIS — E785 Hyperlipidemia, unspecified: Secondary | ICD-10-CM | POA: Diagnosis not present

## 2023-04-06 DIAGNOSIS — J90 Pleural effusion, not elsewhere classified: Secondary | ICD-10-CM | POA: Diagnosis not present

## 2023-04-06 DIAGNOSIS — K219 Gastro-esophageal reflux disease without esophagitis: Secondary | ICD-10-CM | POA: Diagnosis present

## 2023-04-06 DIAGNOSIS — C329 Malignant neoplasm of larynx, unspecified: Secondary | ICD-10-CM | POA: Diagnosis not present

## 2023-04-06 DIAGNOSIS — J9811 Atelectasis: Secondary | ICD-10-CM | POA: Diagnosis not present

## 2023-04-06 DIAGNOSIS — R7989 Other specified abnormal findings of blood chemistry: Secondary | ICD-10-CM | POA: Diagnosis not present

## 2023-04-06 DIAGNOSIS — I25118 Atherosclerotic heart disease of native coronary artery with other forms of angina pectoris: Secondary | ICD-10-CM | POA: Diagnosis present

## 2023-04-06 DIAGNOSIS — F419 Anxiety disorder, unspecified: Secondary | ICD-10-CM | POA: Diagnosis present

## 2023-04-06 DIAGNOSIS — R935 Abnormal findings on diagnostic imaging of other abdominal regions, including retroperitoneum: Secondary | ICD-10-CM | POA: Diagnosis not present

## 2023-04-06 LAB — COMPREHENSIVE METABOLIC PANEL
ALT: 20 U/L (ref 0–44)
AST: 23 U/L (ref 15–41)
Albumin: 3.1 g/dL — ABNORMAL LOW (ref 3.5–5.0)
Alkaline Phosphatase: 47 U/L (ref 38–126)
Anion gap: 11 (ref 5–15)
BUN: 10 mg/dL (ref 8–23)
CO2: 26 mmol/L (ref 22–32)
Calcium: 8.6 mg/dL — ABNORMAL LOW (ref 8.9–10.3)
Chloride: 100 mmol/L (ref 98–111)
Creatinine, Ser: 0.68 mg/dL (ref 0.44–1.00)
GFR, Estimated: >60 mL/min (ref >60)
Glucose, Bld: 100 mg/dL — ABNORMAL HIGH (ref 70–99)
Potassium: 3.1 mmol/L — ABNORMAL LOW (ref 3.5–5.1)
Sodium: 137 mmol/L (ref 135–145)
Total Bilirubin: 0.7 mg/dL (ref 0.3–1.2)
Total Protein: 6.4 g/dL — ABNORMAL LOW (ref 6.5–8.1)

## 2023-04-06 LAB — RESP PANEL BY RT-PCR (RSV, FLU A&B, COVID)  RVPGX2
Influenza A by PCR: NEGATIVE
Influenza B by PCR: NEGATIVE
Resp Syncytial Virus by PCR: NEGATIVE
SARS Coronavirus 2 by RT PCR: NEGATIVE

## 2023-04-06 LAB — CBC
HCT: 38.3 % (ref 36.0–46.0)
Hemoglobin: 12.9 g/dL (ref 12.0–15.0)
MCH: 32.7 pg (ref 26.0–34.0)
MCHC: 33.7 g/dL (ref 30.0–36.0)
MCV: 97.2 fL (ref 80.0–100.0)
Platelets: 244 10*3/uL (ref 150–400)
RBC: 3.94 MIL/uL (ref 3.87–5.11)
RDW: 21.2 % — ABNORMAL HIGH (ref 11.5–15.5)
WBC: 10.5 10*3/uL (ref 4.0–10.5)
nRBC: 0 % (ref 0.0–0.2)

## 2023-04-06 LAB — TROPONIN I (HIGH SENSITIVITY)
Troponin I (High Sensitivity): 62 ng/L — ABNORMAL HIGH (ref <18)
Troponin I (High Sensitivity): 112 ng/L (ref <18)

## 2023-04-06 LAB — LACTIC ACID, PLASMA: Lactic Acid, Venous: 1 mmol/L (ref 0.5–1.9)

## 2023-04-06 LAB — D-DIMER, QUANTITATIVE: D-Dimer, Quant: 1.22 ug/mL-FEU — ABNORMAL HIGH (ref 0.00–0.50)

## 2023-04-06 LAB — BRAIN NATRIURETIC PEPTIDE: B Natriuretic Peptide: 265.9 pg/mL — ABNORMAL HIGH (ref 0.0–100.0)

## 2023-04-06 MED ORDER — ALBUTEROL SULFATE (2.5 MG/3ML) 0.083% IN NEBU
2.5000 mg | INHALATION_SOLUTION | Freq: Four times a day (QID) | RESPIRATORY_TRACT | Status: DC | PRN
  Administered 2023-04-07: 2.5 mg via RESPIRATORY_TRACT
  Filled 2023-04-06: qty 3

## 2023-04-06 MED ORDER — ASPIRIN 81 MG PO TBEC
81.0000 mg | DELAYED_RELEASE_TABLET | Freq: Every day | ORAL | Status: DC
  Administered 2023-04-07 – 2023-04-11 (×5): 81 mg via ORAL
  Filled 2023-04-06 (×5): qty 1

## 2023-04-06 MED ORDER — HEPARIN (PORCINE) 25000 UT/250ML-% IV SOLN
900.0000 [IU]/h | INTRAVENOUS | Status: DC
  Administered 2023-04-06: 700 [IU]/h via INTRAVENOUS
  Filled 2023-04-06: qty 250

## 2023-04-06 MED ORDER — ATORVASTATIN CALCIUM 80 MG PO TABS
80.0000 mg | ORAL_TABLET | Freq: Every day | ORAL | Status: DC
  Administered 2023-04-07 – 2023-04-10 (×5): 80 mg via ORAL
  Filled 2023-04-06 (×4): qty 1
  Filled 2023-04-06: qty 2

## 2023-04-06 MED ORDER — ASPIRIN 81 MG PO CHEW
324.0000 mg | CHEWABLE_TABLET | Freq: Once | ORAL | Status: AC
  Administered 2023-04-06: 324 mg via ORAL

## 2023-04-06 MED ORDER — CLOPIDOGREL BISULFATE 75 MG PO TABS
75.0000 mg | ORAL_TABLET | Freq: Every day | ORAL | Status: DC
  Administered 2023-04-07 – 2023-04-10 (×5): 75 mg via ORAL
  Filled 2023-04-06 (×5): qty 1

## 2023-04-06 MED ORDER — ASPIRIN 81 MG PO CHEW
CHEWABLE_TABLET | ORAL | Status: AC
  Filled 2023-04-06: qty 4

## 2023-04-06 MED ORDER — ALBUTEROL SULFATE (2.5 MG/3ML) 0.083% IN NEBU: 3.0000 mL | INHALATION_SOLUTION | Freq: Four times a day (QID) | RESPIRATORY_TRACT | Status: DC | PRN

## 2023-04-06 MED ORDER — POTASSIUM CHLORIDE CRYS ER 20 MEQ PO TBCR
40.0000 meq | EXTENDED_RELEASE_TABLET | Freq: Once | ORAL | Status: AC
  Administered 2023-04-06: 40 meq via ORAL
  Filled 2023-04-06: qty 2

## 2023-04-06 MED ORDER — ACETAMINOPHEN 325 MG PO TABS: 650.0000 mg | ORAL_TABLET | ORAL | Status: DC | PRN

## 2023-04-06 MED ORDER — NITROGLYCERIN 0.4 MG SL SUBL
0.4000 mg | SUBLINGUAL_TABLET | SUBLINGUAL | Status: DC | PRN
  Administered 2023-04-06: 0.4 mg via SUBLINGUAL
  Filled 2023-04-06: qty 1

## 2023-04-06 MED ORDER — IOHEXOL 350 MG/ML SOLN
75.0000 mL | Freq: Once | INTRAVENOUS | Status: AC | PRN
  Administered 2023-04-06: 75 mL via INTRAVENOUS

## 2023-04-06 MED ORDER — ONDANSETRON HCL 4 MG/2ML IJ SOLN: 4.0000 mg | Freq: Four times a day (QID) | INTRAMUSCULAR | Status: DC | PRN

## 2023-04-06 MED ORDER — METOPROLOL TARTRATE 5 MG/5ML IV SOLN
5.0000 mg | Freq: Once | INTRAVENOUS | Status: AC
  Administered 2023-04-07: 5 mg via INTRAVENOUS
  Filled 2023-04-06: qty 5

## 2023-04-06 MED ORDER — IPRATROPIUM-ALBUTEROL 0.5-2.5 (3) MG/3ML IN SOLN
3.0000 mL | Freq: Once | RESPIRATORY_TRACT | Status: AC
  Administered 2023-04-06: 3 mL via RESPIRATORY_TRACT
  Filled 2023-04-06: qty 3

## 2023-04-06 MED ORDER — TIOTROPIUM BROMIDE MONOHYDRATE 2.5 MCG/ACT IN AERS: 2.0000 | INHALATION_SPRAY | Freq: Every day | RESPIRATORY_TRACT | Status: DC

## 2023-04-06 MED ORDER — OXYCODONE HCL 5 MG PO TABS
10.0000 mg | ORAL_TABLET | Freq: Four times a day (QID) | ORAL | Status: DC | PRN
  Administered 2023-04-07 – 2023-04-11 (×12): 10 mg via ORAL
  Filled 2023-04-06 (×12): qty 2

## 2023-04-06 MED ORDER — HEPARIN BOLUS VIA INFUSION
3000.0000 [IU] | Freq: Once | INTRAVENOUS | Status: AC
  Administered 2023-04-06: 3000 [IU] via INTRAVENOUS
  Filled 2023-04-06: qty 3000

## 2023-04-06 NOTE — Assessment & Plan Note (Signed)
Got SL NTG x1 in ED Getting metoprolol 5mg  IV X1 now as well Will hold home verapamil until cardiology can assess and make recs.

## 2023-04-06 NOTE — ED Notes (Signed)
EDP notified of critical trop of [id number],RN

## 2023-04-06 NOTE — ED Provider Notes (Signed)
[place] EMERGENCY DEPARTMENT AT [facility] Provider Note   CSN: [id number] Arrival date & time: 04/06/23  1618     History  Chief Complaint  Patient presents with   Shortness of Breath    Pt coming from home with shob. 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 No treatment started. Pt has hx of CHF, COPD, Afib. Pt denies chest pain.    Marie Johnson is a 72 y.o. female.  72 year old female with a history of CHF, PAD status post bypass, nonobstructive CAD, laryngeal cancer, COPD not on home oxygen who presents emergency department with shortness of breath.  Several hours prior to arrival reports that she had acute onset of shortness of breath.  No recent fevers, runny nose, sore throat, cough, or chest pain.  Tried an inhaler at home which mildly improved her symptoms. When ems arrived they found her O2 sats to be 90 and placed on 2 L nasal cannula.  No additional interventions were given en route. Currently being worked up for laryngeal cancer but does not feel that her upper airway is closing.  Did have laryngeal cancer that was biopsied 1 month ago.  Denies any new neck pain or swelling after the procedure.       Home Medications Prior to Admission medications   Medication Sig Start Date End Date Taking? Authorizing Provider  albuterol (PROVENTIL HFA;VENTOLIN HFA) 108 (90 BASE) MCG/ACT inhaler Inhale 2 puffs into the lungs every 6 (six) hours as needed for wheezing or shortness of breath.   Yes [provider]  albuterol (PROVENTIL) (2.5 MG/3ML) 0.083% nebulizer solution Take 2.5 mg by nebulization every 6 (six) hours as needed for wheezing or shortness of breath.   Yes [provider]  alendronate (FOSAMAX) 70 MG tablet Take 70 mg by mouth every Wednesday. 02/03/16   [provider]  allopurinol (ZYLOPRIM) 300 MG tablet Take 300 mg by mouth every evening. 08/02/17   [provider]  aspirin EC 81 MG tablet Take 81 mg by mouth  in the morning.    [provider]  atorvastatin (LIPITOR) 80 MG tablet Take 80 mg by mouth at bedtime. 02/04/20   [provider]  calcium carbonate (ANTACID) 500 MG chewable tablet Chew 1 tablet by mouth 2 (two) times daily as needed for heartburn or indigestion.    [provider]  Cholecalciferol (VITAMIN D) 125 MCG (5000 UT) CAPS Take 5,000 Units by mouth in the morning.    [provider]  clopidogrel (PLAVIX) 75 MG tablet Take 1 tablet (75 mg total) by mouth daily. 07/27/22 07/27/23  Lars Mage, PA-C  cyanocobalamin (VITAMIN B12) 1000 MCG tablet Take 1,000 mcg by mouth daily.    [provider]  EPIPEN 2-PAK 0.3 MG/0.3ML SOAJ injection Inject 0.3 mg into the muscle daily as needed for anaphylaxis. Use as directed as needed for allergies. 12/14/15   [provider]  ezetimibe (ZETIA) 10 MG tablet Take 1 tablet (10 mg total) by mouth daily. 03/29/23   Meriam Sprague, MD  furosemide (LASIX) 40 MG tablet Take 1 tablet (40 mg total) by mouth daily. 09/12/22   Meriam Sprague, MD  lactulose (CHRONULAC) 10 GM/15ML solution Take 20 g by mouth 2 (two) times daily. 10/29/19   [provider]  melatonin 5 MG TABS Take 5 mg by mouth at bedtime.    [provider]  Menthol-Methyl Salicylate (MUSCLE RUB) 10-15 % CREA Apply 1 application topically daily as  needed (for arthritis pain).    [provider]  nitroGLYCERIN (NITROSTAT) 0.4 MG SL tablet Place 1 tablet (0.4 mg total) under the tongue every 5 (five) minutes x 3 doses as needed for chest pain. 04/26/16   Gypsy Balsam K, NP  Oxycodone HCl 10 MG TABS Take 1 tablet (10 mg total) by mouth 4 (four) times daily as needed (for pain). 03/22/20   Emilie Rutter, PA-C  pantoprazole (PROTONIX) 40 MG tablet Take 40 mg by mouth 2 (two) times daily.    [provider]  potassium chloride SA (KLOR-CON M20) 20 MEQ tablet Take 1 tablet (20 mEq total) by mouth daily.  02/18/23   Meriam Sprague, MD  senna-docusate (SENOKOT-S) 8.6-50 MG tablet Take 2 tablets by mouth at bedtime.    [provider]  SPIRIVA RESPIMAT 2.5 MCG/ACT AERS SMARTSIG:2 Puff(s) Via Inhaler Daily 04/03/23   [provider]  spironolactone (ALDACTONE) 25 MG tablet Take 1 tablet (25 mg total) by mouth daily. 09/03/17   Lars Masson, MD  verapamil (CALAN-SR) 240 MG CR tablet Take 1 tablet (240 mg total) by mouth daily. 03/29/23   Meriam Sprague, MD      Allergies    Bee venom, Insect extract, Farxiga [dapagliflozin], Lyrica [pregabalin], Neurontin [gabapentin], Nsaids, and Latex    Review of Systems   Review of Systems  Physical Exam Updated Vital Signs BP (!) 154/87   Pulse (!) 113   Temp 99.2 F (37.3 C) (Oral)   Resp (!) 21   SpO2 93%  Physical Exam Vitals and nursing note reviewed.  Constitutional:      General: She is not in acute distress.    Appearance: She is well-developed.  HENT:     Head: Normocephalic and atraumatic.     Right Ear: External ear normal.     Left Ear: External ear normal.     Nose: Nose normal.     Mouth/Throat:     Mouth: Mucous membranes are moist.     Pharynx: Oropharynx is clear. No oropharyngeal exudate or posterior oropharyngeal erythema.  Eyes:     Extraocular Movements: Extraocular movements intact.     Conjunctiva/sclera: Conjunctivae normal.     Pupils: Pupils are equal, round, and reactive to light.  Cardiovascular:     Rate and Rhythm: Tachycardia present. Rhythm irregular.     Heart sounds: No murmur heard. Pulmonary:     Effort: Pulmonary effort is normal. No respiratory distress.     Breath sounds: Normal breath sounds. No stridor. No wheezing.     Comments: Speaking in full sentences.  On 2 L nasal cannula. Musculoskeletal:     Cervical back: Normal range of motion and neck supple.     Right lower leg: Edema present.     Left lower leg: Edema present.  Skin:    General: Skin is warm and  dry.  Neurological:     Mental Status: She is alert and oriented to person, place, and time. Mental status is at baseline.  Psychiatric:        Mood and Affect: Mood normal.     ED Results / Procedures / Treatments   Labs (all labs ordered are listed, but only abnormal results are displayed) Labs Reviewed  COMPREHENSIVE METABOLIC PANEL - Abnormal; Notable for the following components:      Result Value   Potassium 3.1 (*)    Glucose, Bld 100 (*)    Calcium 8.6 (*)    Total Protein  6.4 (*)    Albumin 3.1 (*)    All other components within normal limits  CBC - Abnormal; Notable for the following components:   RDW 21.2 (*)    All other components within normal limits  BRAIN NATRIURETIC PEPTIDE - Abnormal; Notable for the following components:   B Natriuretic Peptide 265.9 (*)    All other components within normal limits  D-DIMER, QUANTITATIVE - Abnormal; Notable for the following components:   D-Dimer, Quant 1.22 (*)    All other components within normal limits  TROPONIN I (HIGH SENSITIVITY) - Abnormal; Notable for the following components:   Troponin I (High Sensitivity) 62 (*)    All other components within normal limits  TROPONIN I (HIGH SENSITIVITY) - Abnormal; Notable for the following components:   Troponin I (High Sensitivity) 112 (*)    All other components within normal limits  RESP PANEL BY RT-PCR (RSV, FLU A&B, COVID)  [id number]    EKG EKG Interpretation Date/Time:  Saturday April 06 2023 16:56:52 EDT Ventricular Rate:  108 PR Interval:  160 QRS Duration:  84 QT Interval:  341 QTC Calculation: 457 R Axis:   -84  Text Interpretation: Sinus tachycardia Left anterior fascicular block Anterior infarct, old Confirmed by Vonita Moss [phone]) on 04/06/2023 6:02:16 PM  Radiology CT Angio Chest PE W and/or Wo Contrast  Result Date: 04/06/2023 CLINICAL DATA:  Positive D-dimer and shortness of breath, initial encounter EXAM: CT ANGIOGRAPHY CHEST WITH CONTRAST  TECHNIQUE: Multidetector CT imaging of the chest was performed using the standard protocol during bolus administration of intravenous contrast. Multiplanar CT image reconstructions and MIPs were obtained to evaluate the vascular anatomy. RADIATION DOSE REDUCTION: This exam was performed according to the departmental dose-optimization program which includes automated exposure control, adjustment of the mA and/or kV according to patient size and/or use of iterative reconstruction technique. CONTRAST:  75mL OMNIPAQUE IOHEXOL 350 MG/ML SOLN COMPARISON:  Chest x-ray from earlier in the same day, CT from 04/20/2022 FINDINGS: Cardiovascular: Atherosclerotic calcifications of the thoracic aorta are noted. No ascending aneurysmal dilatation is seen. No significant aortic opacification is noted to suggest dissection. Descending aorta demonstrates stable dilatation at the level of the aortic hiatus measuring up to 4.5 cm. This is relatively stable from the prior exam. Heart is mildly enlarged in size. The pulmonary artery shows a normal branching pattern bilaterally. No filling defect to suggest pulmonary embolism is noted. Scattered coronary calcifications are noted. 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 Mild contour deformity is noted suggesting stability. Mediastinum/Nodes: Thoracic inlet is within normal limits. No sizable hilar or mediastinal adenopathy is noted. Calcified lymph nodes are noted within the hila and mediastinum consistent with prior granulomatous disease. Lungs/Pleura: Lungs are well aerated bilaterally. Scattered calcified granulomas are seen. Mild emphysematous change is noted. Mild right basilar atelectasis is noted. Upper Abdomen: Visualized upper abdomen shows calcified splenic granulomas. No other focal abnormality is seen. Vascular calcifications are noted. Musculoskeletal: No acute  rib abnormality is noted. Degenerative changes of the thoracic spine are seen. No compression deformity is noted. Review of the MIP images confirms the above findings. IMPRESSION: No evidence of pulmonary emboli. 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 The overall morphology of the aorta is stable however. Changes consistent with prior granulomatous disease. Aortic Atherosclerosis (ICD10-I70.0) and Emphysema (ICD10-J43.9).  Electronically Signed   By: Alcide Clever M.D.   On: 04/06/2023 20:40   DG Chest Port 1 View  Result Date: 04/06/2023 CLINICAL DATA:  Shortness of breath EXAM: PORTABLE CHEST 1 VIEW COMPARISON:  Chest x-ray 03/18/2019 FINDINGS: There some atelectasis in the lung bases. There is a small left pleural effusion. There is no pneumothorax or acute fracture. The heart is mildly enlarged, unchanged. IMPRESSION: 1. Small left pleural effusion. 2. Bibasilar atelectasis. Electronically Signed   By: Darliss Cheney M.D.   On: 04/06/2023 17:27    Procedures Procedures    Medications Ordered in ED Medications  potassium chloride SA (KLOR-CON M) CR tablet 40 mEq (has no administration in time range)  aspirin chewable tablet 324 mg (has no administration in time range)  ipratropium-albuterol (DUONEB) 0.5-2.5 (3) MG/3ML nebulizer solution 3 mL (3 mLs Nebulization Given 04/06/23 1827)  iohexol (OMNIPAQUE) 350 MG/ML injection 75 mL (75 mLs Intravenous Contrast Given 04/06/23 2026)    ED Course/ Medical Decision Making/ A&P Clinical Course as of 04/06/23 2131  Sat Apr 06, 2023  2107 Discussed with ENT Dr. Jenne Pane.  He was the physician that performed the biopsy.  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  Does not feel that it would be responsible for her shortness of breath.  Is recommending oncology follow-up and if she is  admitted may be beneficial to have inpatient oncology see her. [RP]  2114 Dr Julian Reil from hospitalist to admit.  [RP]  2127 Dr Hyacinth Meeker from cardiology has reviewed the case. Recommends treating as an nstemi until proven otherwise. Will start her on a heparin drip at this time.  [RP]    Clinical Course User Index [RP] Rondel Baton, MD                             Medical Decision Making Amount and/or Complexity of Data Reviewed Labs: ordered. Radiology: ordered.  Risk OTC drugs. Prescription drug management. Decision regarding hospitalization.   NAOMY ESHAM is a 72 y.o. female with comorbidities that complicate the patient evaluation including CHF, PAD status post bypass, nonobstructive CAD, laryngeal cancer, COPD not on home oxygen who presents emergency department with shortness of breath.    Initial Ddx:  MI, PE, COPD exacerbation, CHF exacerbation, laryngeal cancer/airway obstruction  MDM/Course:  Patient presents emergency department with acute onset of shortness of breath.  She later clarified that she thinks that this has been worsening over the past 3 days.  No chest pain.  On exam does not have any stridor or signs of an upper airway obstruction.  No wheezing to suggest COPD exacerbation.  Had elevated troponin that continue to rise.  EKG did not show any ischemic changes.  Was given aspirin in case this was an anginal equivalent and cardiology was consulted.  They recommended starting her on a heparin drip at this time.  Had a CTA after D-dimer was elevated so she had a CTA that did not show any explanation for her shortness of breath and did not show any evidence of PE.  Which upon re-evaluation patient remained stable.  Patient was admitted to medicine for further management.  Did also discussed the patient with ENT who felt that her laryngeal mass was very small and is likely not the cause of her symptoms especially since he is not having any stridor.  This patient  presents to the ED  for concern of complaints listed in HPI, this involves an extensive number of treatment options, and is a complaint that carries with it a high risk of complications and morbidity. Disposition including potential need for admission considered.   Dispo: Admit to Floor  Additional history obtained from daughter Records reviewed Outpatient Clinic Notes The following labs were independently interpreted: Serial Troponins and show  elevations concerning for NSTEMI I independently reviewed the following imaging with scope of interpretation limited to determining acute life threatening conditions related to emergency care: Chest x-ray and agree with the radiologist interpretation with the following exceptions: none I personally reviewed and interpreted the pt's EKG: see above for interpretation  I have reviewed the patients home medications and made adjustments as needed Consults: Cardiology, Hospitalist, and ENT Social Determinants of health:  Elderly    CRITICAL CARE Performed by: Rondel Baton   Total critical care time: 30 minutes  Critical care time was exclusive of separately billable procedures and treating other patients.  Critical care was necessary to treat or prevent imminent or life-threatening deterioration.  Critical care was time spent personally by me on the following activities: development of treatment plan with patient and/or surrogate as well as nursing, discussions with consultants, evaluation of patient's response to treatment, examination of patient, obtaining history from patient or surrogate, ordering and performing treatments and interventions, ordering and review of laboratory studies, ordering and review of radiographic studies, pulse oximetry and re-evaluation of patient's condition.     Final Clinical Impression(s) / ED Diagnoses Final diagnoses:  Dyspnea, unspecified type  NSTEMI (non-ST elevated myocardial infarction) [facility])  History of  laryngeal cancer    Rx / DC Orders ED Discharge Orders     None         Rondel Baton, MD 04/06/23 2131

## 2023-04-06 NOTE — H&P (Signed)
History and Physical    Patient: Marie Johnson:[id number] DOB: [birth year] DOA: 04/06/2023 DOS: the patient was seen and examined on 04/06/2023 PCP: HamrickDurward Fortes, MD  Patient coming from: Home  Chief Complaint:  Chief Complaint  Patient presents with   Shortness of Breath    Pt coming from home with shob. 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 No treatment started. Pt has hx of CHF, COPD, Afib. Pt denies chest pain.   HPI: Marie Johnson is a 72 y.o. female with medical history significant of dCHF, PAD s/p bypass, non occlusive CAD, recently diagnosed laryngeal CA.  Pt in to ED with c/o sudden onset of SOB earlier this evening.  No recent fevers, URI symptoms, cough, sore throat.  No chest "pain" but does have "pressure" over her L chest sub sternally, this worse with activity.  No radiation.  This onset with the onset of SOB.  Inhaler at home didn't provide much relief.  EMS called.  EMS noted O2 sat of 90, put her on 2L via [place].  In ED: Got neb treatment without much improvement.  Not wheezing.  Trop has elevated from 62 to 112.  EKG with s.tach. Review of Systems: As mentioned in the history of present illness. All other systems reviewed and are negative. Past Medical History:  Diagnosis Date   AAA (abdominal aortic aneurysm) (HCC)    3.4 cm   Anemia    Anxiety    Aortic atherosclerosis (HCC)    Arthritis    Asthma    CAD (coronary artery disease)    a. Nonobstructive CAD by cath 07/17/13.   Cancer [facility])    Carotid artery disease (HCC)    Carotid US 8/22: Bilateral ICA 1-39; right subclavian stenosis; right thyroid nodule   CHD (congenital heart disease)    pt unaware???   Chest pain    a. Adm 10-07/2013 - CTA neg for PE/dissection, cath with nonobstructive disease, CP ?due to uncontrolled HTN vs coronary vasospasm.   CHF (congestive heart failure) (HCC)    Closed head injury with concussion    COPD (chronic obstructive pulmonary disease) (HCC)     Coronary artery disease    Echocardiogram    Echocardiogram 06/2019: EF 60-65, impaired relaxation (Gr 1 DD), mild MAC, mild MR, mild to mod aortic valve sclerosis (no AS), trivial TR, mild LAE, normal RVSF, RVSP 30.7 (mildly elevated)   GERD (gastroesophageal reflux disease)    History of blood transfusion    childbirth, Hysterectomy   Hyperlipidemia    Hypertension    Neuropathy    OA (osteoarthritis)    Osteoporosis    PAD (peripheral artery disease) (HCC)    a. s/p L fem-pop bypass b. recent evaluation at [facility], unamenable to intervention   PAD (peripheral artery disease) (HCC)    Palpitations 02/22/2016   Pneumonia    Pre-diabetes    Premature ventricular contraction    PUD (peptic ulcer disease)    PVD (peripheral vascular disease) (HCC) 07/17/2013   Thoracic aortic aneurysm (HCC)    Chest/aorta CTA 04/2022: 14 x 8 focal saccular aneurysm versus penetrating atherosclerotic ulcer involving left side of proximal portion of transverse aortic arch; descending thoracic aorta and proximal abdominal aorta aneurysm 4.4 cm; aortic atherosclerosis; emphysema   Tobacco abuse    Wears dentures    Wears glasses    Past Surgical History:  Procedure Laterality Date   ABDOMINAL AORTOGRAM W/LOWER EXTREMITY N/A 03/09/2020   Procedure: ABDOMINAL AORTOGRAM W/LOWER EXTREMITY;  Surgeon: Cephus Shelling, MD;  Location: [facility] INVASIVE CV LAB;  Service: Cardiovascular;  Laterality: N/A;   ABDOMINAL AORTOGRAM W/LOWER EXTREMITY N/A 07/26/2022   Procedure: ABDOMINAL AORTOGRAM W/LOWER EXTREMITY;  Surgeon: Cephus Shelling, MD;  Location: [facility] INVASIVE CV LAB;  Service: Cardiovascular;  Laterality: N/A;   ABDOMINAL AORTOGRAM W/LOWER EXTREMITY N/A 09/20/2022   Procedure: ABDOMINAL AORTOGRAM W/LOWER EXTREMITY;  Surgeon: Cephus Shelling, MD;  Location: [facility] INVASIVE CV LAB;  Service: Cardiovascular;  Laterality: N/A;   ABDOMINAL HYSTERECTOMY     BREAST SURGERY     left cyst excision x 2   CARDIAC  CATHETERIZATION     several years ago, nonobstructive, "50-60% blockages"   DIRECT LARYNGOSCOPY Bilateral 03/13/2023   Procedure: MICRO DIRECT LARYNGOSCOPY WITH BIOPSY;  Surgeon: Christia Reading, MD;  Location[facility] OR;  Service: ENT;  Laterality: Bilateral;   ENDARTERECTOMY FEMORAL Right 03/18/2020   Procedure: ENDARTERECTOMY FEMORAL WITH PROFUNDAPLASTY;  Surgeon: Cephus Shelling, MD;  Location: [facility] OR;  Service: Vascular;  Laterality: Right;   FEMORAL-POPLITEAL BYPASS GRAFT Right 03/18/2020   Procedure: BYPASS GRAFT FEMORAL-POPLITEAL ARTERY using GORE PROPATEN VASCULAR GRAFT;  Surgeon: Cephus Shelling, MD;  Location: [facility] OR;  Service: Vascular;  Laterality: Right;   HIP SURGERY     left - bars and screws placed   HIP SURGERY     INSERTION OF ILIAC STENT Right 03/18/2020   Procedure: INSERTION OF GORE VIABAHN  ILIAC STENT;  Surgeon: Cephus Shelling, MD;  Location: [facility] OR;  Service: Vascular;  Laterality: Right;   LEFT HEART CATHETERIZATION WITH CORONARY ANGIOGRAM N/A 07/17/2013   Procedure: LEFT HEART CATHETERIZATION WITH CORONARY ANGIOGRAM;  Surgeon: Laurey Morale, MD;  Location: [facility] CATH LAB;  Service: Cardiovascular;  Laterality: N/A;   LOWER EXTREMITY ANGIOGRAM Right 03/18/2020   Procedure: RIGHT ILIAC ARTERIOGRAM WITH AN ILIAC STENT;  Surgeon: Cephus Shelling, MD;  Location: [facility] OR;  Service: Vascular;  Laterality: Right;   MULTIPLE TOOTH EXTRACTIONS     PATCH ANGIOPLASTY Right 03/18/2020   Procedure: PATCH ANGIOPLASTY using a Livia Snellen BIOLOGIC PATCH OF THE PROFUNDA;  Surgeon: Cephus Shelling, MD;  Location: [facility] OR;  Service: Vascular;  Laterality: Right;   PERIPHERAL VASCULAR CATHETERIZATION N/A 07/16/2016   Procedure: Abdominal Aortogram w/Lower Extremity;  Surgeon: Larina Earthly, MD;  Location: [facility] INVASIVE CV LAB;  Service: Cardiovascular;  Laterality: N/A;   PERIPHERAL VASCULAR INTERVENTION Left 03/09/2020   Procedure: PERIPHERAL VASCULAR INTERVENTION;  Surgeon: Cephus Shelling, MD;  Location: [facility] INVASIVE CV LAB;  Service: Cardiovascular;  Laterality: Left;  common and external illiac    ROTATOR CUFF REPAIR     right side, had torn ligaments as well   TUBAL LIGATION     VEIN BYPASS SURGERY     left leg   WRIST ARTHROCENTESIS     left - broke in 3 places so has bars and screws placed   Social History:  reports that she has been smoking cigarettes. She has a 28.5 pack-year smoking history. She has been exposed to tobacco smoke. She has never used smokeless tobacco. She reports that she does not drink alcohol and does not use drugs.  Allergies  Allergen Reactions   Bee Venom Anaphylaxis and Other (See Comments)    Welts, also    Insect bites- Welts, also   Insect Extract Anaphylaxis and Other (See Comments)    Insect bites- Welts, also   [place] [Dapagliflozin] Nausea And Vomiting   Lyrica [Pregabalin] Nausea And Vomiting  Neurontin [Gabapentin] Nausea And Vomiting and Other (See Comments)    Hypertension/dizziness   Nsaids Nausea Only and Other (See Comments)    Patient is not suppose to have this class of medication (liver)  Can tolerate 81mg , which she is taking   Latex Rash and Other (See Comments)    Family History  Problem Relation Age of Onset   Cervical cancer Mother    Heart disease Mother        MI in 80s   Liver cancer Father    Heart disease Father    Lung cancer Maternal Aunt    Prostate cancer Maternal Uncle        met. anal   Breast cancer Maternal Grandmother    Lung cancer Maternal Aunt    Lung cancer Maternal Aunt    Stomach cancer Maternal Aunt    Colon cancer Maternal Aunt    Lung cancer Maternal Uncle     Prior to Admission medications   Medication Sig Start Date End Date Taking? Authorizing Provider  albuterol (PROVENTIL HFA;VENTOLIN HFA) 108 (90 BASE) MCG/ACT inhaler Inhale 2 puffs into the lungs every 6 (six) hours as needed for wheezing or shortness of breath.   Yes [provider]  albuterol  (PROVENTIL) (2.5 MG/3ML) 0.083% nebulizer solution Take 2.5 mg by nebulization every 6 (six) hours as needed for wheezing or shortness of breath.   Yes [provider]  alendronate (FOSAMAX) 70 MG tablet Take 70 mg by mouth every Wednesday. 02/03/16  Yes [provider]  allopurinol (ZYLOPRIM) 300 MG tablet Take 300 mg by mouth every evening. 08/02/17  Yes [provider]  aspirin EC 81 MG tablet Take 81 mg by mouth in the morning.   Yes [provider]  atorvastatin (LIPITOR) 80 MG tablet Take 80 mg by mouth at bedtime. 02/04/20  Yes [provider]  calcium carbonate (ANTACID) 500 MG chewable tablet Chew 1 tablet by mouth 2 (two) times daily as needed for heartburn or indigestion.   Yes [provider]  Cholecalciferol (VITAMIN D) 125 MCG (5000 UT) CAPS Take 5,000 Units by mouth in the morning.   Yes [provider]  clopidogrel (PLAVIX) 75 MG tablet Take 1 tablet (75 mg total) by mouth daily. Patient taking differently: Take 75 mg by mouth at bedtime. 07/27/22 07/27/23 Yes Lars Mage, PA-C  cyanocobalamin (VITAMIN B12) 1000 MCG tablet Take 1,000 mcg by mouth daily.   Yes [provider]  ezetimibe (ZETIA) 10 MG tablet Take 1 tablet (10 mg total) by mouth daily. Patient taking differently: Take 10 mg by mouth at bedtime. 03/29/23  Yes Meriam Sprague, MD  furosemide (LASIX) 40 MG tablet Take 1 tablet (40 mg total) by mouth daily. 09/12/22  Yes Meriam Sprague, MD  lactulose (CHRONULAC) 10 GM/15ML solution Take 20 g by mouth 2 (two) times daily as needed for moderate constipation. 10/29/19  Yes [provider]  melatonin 5 MG TABS Take 5 mg by mouth at bedtime.   Yes [provider]  Menthol-Methyl Salicylate (MUSCLE RUB) 10-15 % CREA Apply 1 application topically daily as needed (for arthritis pain).   Yes [provider]  nitroGLYCERIN (NITROSTAT) 0.4 MG SL tablet Place 1 tablet (0.4 mg  total) under the tongue every 5 (five) minutes x 3 doses as needed for chest pain. 04/26/16  Yes Seiler, Amber K, NP  Oxycodone HCl 10 MG TABS Take 1 tablet (10 mg total) by mouth 4 (four) times daily  as needed (for pain). 03/22/20  Yes Emilie Rutter, PA-C  pantoprazole (PROTONIX) 40 MG tablet Take 40 mg by mouth 2 (two) times daily.   Yes [provider]  potassium chloride SA (KLOR-CON M20) 20 MEQ tablet Take 1 tablet (20 mEq total) by mouth daily. 02/18/23  Yes Pemberton, Kathlynn Grate, MD  senna-docusate (SENOKOT-S) 8.6-50 MG tablet Take 2 tablets by mouth at bedtime.   Yes [provider]  SPIRIVA RESPIMAT 2.5 MCG/ACT AERS Take 2 puffs by mouth daily. 04/03/23  Yes [provider]  spironolactone (ALDACTONE) 25 MG tablet Take 1 tablet (25 mg total) by mouth daily. 09/03/17  Yes Lars Masson, MD  verapamil (CALAN-SR) 240 MG CR tablet Take 1 tablet (240 mg total) by mouth daily. 03/29/23  Yes Pemberton, Kathlynn Grate, MD  EPIPEN 2-PAK 0.3 MG/0.3ML SOAJ injection Inject 0.3 mg into the muscle daily as needed for anaphylaxis. Use as directed as needed for allergies. Patient not taking: Reported on 04/06/2023 12/14/15   [provider]    Physical Exam: Vitals:   04/06/23 1657 04/06/23 1658 04/06/23 1930 04/06/23 2126  BP:   (!) 154/87   Pulse:   (!) 113   Resp:   (!) 21   Temp: 98.7 F (37.1 C)   99.2 F (37.3 C)  TempSrc: Oral   Oral  SpO2:  93% 93%    Constitutional: NAD, calm, comfortable Respiratory: BS diminished throughout, but decent air movement, no wheezing audible. Cardiovascular: Tachycardic Abdomen: no tenderness, no masses palpated. No hepatosplenomegaly. Bowel sounds positive.  Neurologic: CN 2-12 grossly intact. Sensation intact, DTR normal. Strength 5/5 in all 4.  Psychiatric: Normal judgment and insight. Alert and oriented x 3. Normal mood.   Data Reviewed:    Labs on Admission: I have personally reviewed following labs and imaging  studies  CBC: Recent Labs  Lab 04/06/23 1755  WBC 10.5  HGB 12.9  HCT 38.3  MCV 97.2  PLT 244   Basic Metabolic Panel: Recent Labs  Lab 04/06/23 1755  NA 137  K 3.1*  CL 100  CO2 26  GLUCOSE 100*  BUN 10  CREATININE 0.68  CALCIUM 8.6*   GFR: Estimated Creatinine Clearance: 57.5 mL/min (by C-G formula based on SCr of 0.68 mg/dL). Liver Function Tests: Recent Labs  Lab 04/06/23 1755  AST 23  ALT 20  ALKPHOS 47  BILITOT 0.7  PROT 6.4*  ALBUMIN 3.1*   No results for input(s): "LIPASE", "AMYLASE" in the last 168 hours. No results for input(s): "AMMONIA" in the last 168 hours. Coagulation Profile: No results for input(s): "INR", "PROTIME" in the last 168 hours. Cardiac Enzymes: No results for input(s): "CKTOTAL", "CKMB", "CKMBINDEX", "TROPONINI" in the last 168 hours. BNP (last 3 results) No results for input(s): "PROBNP" in the last 8760 hours. HbA1C: No results for input(s): "HGBA1C" in the last 72 hours. CBG: No results for input(s): "GLUCAP" in the last 168 hours. Lipid Profile: No results for input(s): "CHOL", "HDL", "LDLCALC", "TRIG", "CHOLHDL", "LDLDIRECT" in the last 72 hours. Thyroid Function Tests: No results for input(s): "TSH", "T4TOTAL", "FREET4", "T3FREE", "THYROIDAB" in the last 72 hours. Anemia Panel: No results for input(s): "VITAMINB12", "FOLATE", "FERRITIN", "TIBC", "IRON", "RETICCTPCT" in the last 72 hours. Urine analysis:    Component Value Date/Time   COLORURINE STRAW (A) 03/15/2020 1007   APPEARANCEUR CLEAR 03/15/2020 1007   LABSPEC 1.005 03/15/2020 1007   PHURINE 6.0 03/15/2020 1007   GLUCOSEU NEGATIVE 03/15/2020 1007   HGBUR SMALL (A) 03/15/2020 1007  BILIRUBINUR NEGATIVE 03/15/2020 1007   KETONESUR NEGATIVE 03/15/2020 1007   PROTEINUR NEGATIVE 03/15/2020 1007   NITRITE NEGATIVE 03/15/2020 1007   LEUKOCYTESUR NEGATIVE 03/15/2020 1007    Radiological Exams on Admission: CT Angio Chest PE W and/or Wo Contrast  Result Date:  04/06/2023 CLINICAL DATA:  Positive D-dimer and shortness of breath, initial encounter EXAM: CT ANGIOGRAPHY CHEST WITH CONTRAST TECHNIQUE: Multidetector CT imaging of the chest was performed using the standard protocol during bolus administration of intravenous contrast. Multiplanar CT image reconstructions and MIPs were obtained to evaluate the vascular anatomy. RADIATION DOSE REDUCTION: This exam was performed according to the departmental dose-optimization program which includes automated exposure control, adjustment of the mA and/or kV according to patient size and/or use of iterative reconstruction technique. CONTRAST:  75mL OMNIPAQUE IOHEXOL 350 MG/ML SOLN COMPARISON:  Chest x-ray from earlier in the same day, CT from 04/20/2022 FINDINGS: Cardiovascular: Atherosclerotic calcifications of the thoracic aorta are noted. No ascending aneurysmal dilatation is seen. No significant aortic opacification is noted to suggest dissection. Descending aorta demonstrates stable dilatation at the level of the aortic hiatus measuring up to 4.5 cm. This is relatively stable from the prior exam. Heart is mildly enlarged in size. The pulmonary artery shows a normal branching pattern bilaterally. No filling defect to suggest pulmonary embolism is noted. Scattered coronary calcifications are noted. 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 Mild contour deformity is noted suggesting stability. Mediastinum/Nodes: Thoracic inlet is within normal limits. No sizable hilar or mediastinal adenopathy is noted. Calcified lymph nodes are noted within the hila and mediastinum consistent with prior granulomatous disease. Lungs/Pleura: Lungs are well aerated bilaterally. Scattered calcified granulomas are seen. Mild emphysematous change is noted. Mild right basilar atelectasis is noted. Upper Abdomen: Visualized upper abdomen shows  calcified splenic granulomas. No other focal abnormality is seen. Vascular calcifications are noted. Musculoskeletal: No acute rib abnormality is noted. Degenerative changes of the thoracic spine are seen. No compression deformity is noted. Review of the MIP images confirms the above findings. IMPRESSION: No evidence of pulmonary emboli. 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 The overall morphology of the aorta is stable however. Changes consistent with prior granulomatous disease. Aortic Atherosclerosis (ICD10-I70.0) and Emphysema (ICD10-J43.9). Electronically Signed   By: Alcide Clever M.D.   On: 04/06/2023 20:40   DG Chest Port 1 View  Result Date: 04/06/2023 CLINICAL DATA:  Shortness of breath EXAM: PORTABLE CHEST 1 VIEW COMPARISON:  Chest x-ray 03/18/2019 FINDINGS: There some atelectasis in the lung bases. There is a small left pleural effusion. There is no pneumothorax or acute fracture. The heart is mildly enlarged, unchanged. IMPRESSION: 1. Small left pleural effusion. 2. Bibasilar atelectasis. Electronically Signed   By: Darliss Cheney M.D.   On: 04/06/2023 17:27    EKG: Independently reviewed.   Assessment and Plan: * NSTEMI (non-ST elevated myocardial infarction) (HCC) Suspicious for NSTEMI with SOB being anginal equivalent.  Pt does have chest pressure when she gets up and moves.  Symptoms (including SOB and chest pressure) seem to have improved significantly after SL NTG x1 (when neb treatments and other interventions havent helped much), this further raises suspicion for a cardiac / coronary event. Trop has risen since presentation from 30 to 112 CTA chest is neg for PE No contrast in aorta, but aorta appears  grossly unchanged from prior exams per radiologist. NSTEMI pathway Tele monitor Cards coming to evaluate: Recd heparin gtt which has been  started SL NTG PRN for the moment Giving 5mg  IV metoprolol x1 now for persistent tachycardia to 120s-130s with BP 160/100 currently. Cont lipitor Got ASA in ED, cont ASA 81 daily Cont at bedtime plavix which she takes for PAD NPO after MN  Laryngeal cancer [facility]) Recent diagnosis on biopsy by ENT. EDP d/w Dr. 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 Dr. Jenne Pane notes that the mass was very small on scope He isnt concerned that mass could be occluding airway at this time, doubts that this is cause of her presentation today.  COPD (chronic obstructive pulmonary disease) (HCC) Pt with known COPD and ongoing smoking.  However: no wheezing on todays presentation, and current room O2 sat of 88%, while low, does appear c/w her baseline as documented by cardiologist office note x2 days ago. COPD exacerbation is possible but seems less likely than cardiac cause of presentation today. Cont home scheduled and PRN nebs  Chronic diastolic CHF (congestive heart failure) (HCC) BNP slightly elevated at 265 today, but no evidence of pulmonary edema or volume overload on imaging studies. Dont think CHF exacerbation is responsible for todays presentation either. Cards coming to see patient as well Hold home diuretics until they can evaluate and make recs.  Hypertension Got SL NTG x1 in ED Getting metoprolol 5mg  IV X1 now as well Will hold home verapamil until cardiology can assess and make recs.      Advance Care Planning:   Code Status: Full Code  Consults: Cards  Family Communication: Daughter at bedside  Severity of Illness: The appropriate patient status for this patient is OBSERVATION. Observation status is judged to be reasonable and necessary in order to provide the required intensity of service to ensure the patient's safety. The patient's presenting symptoms, physical exam findings, and initial radiographic and laboratory data in the context of their medical  condition is felt to place them at decreased risk for further clinical deterioration. Furthermore, it is anticipated that the patient will be medically stable for discharge from the hospital within 2 midnights of admission.   Author: Hillary Bow., DO 04/06/2023 11:47 PM  For on call review [email or web address].

## 2023-04-06 NOTE — H&P (Incomplete)
History and Physical    Patient: Marie Johnson WUJ:[id number] DOB: [birth year] DOA: 04/06/2023 DOS: the patient was seen and examined on 04/06/2023 PCP: Ailene Ravel, MD  Patient coming from: Home  Chief Complaint:  Chief Complaint  Patient presents with  . Shortness of Breath    Pt coming from home with shob. 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 No treatment started. Pt has hx of CHF, COPD, Afib. Pt denies chest pain.   HPI: Marie Johnson is a 72 y.o. female with medical history significant of dCHF, PAD s/p bypass, non occlusive CAD, recently diagnosed laryngeal CA.  Pt in to ED with c/o sudden onset of SOB earlier this evening.  No recent fevers, URI symptoms, cough, sore throat.  No chest "pain" but does have "pressure" over her L chest sub sternally, this worse with activity.  No radiation.  This onset with the onset of SOB.  Inhaler at home didn't provide much relief.  EMS called.  EMS noted O2 sat of 90, put her on 2L via [place].  In ED: Got neb treatment without much improvement.  Not wheezing.  Trop has elevated from 62 to 112.  EKG with s.tach. Review of Systems: As mentioned in the history of present illness. All other systems reviewed and are negative. Past Medical History:  Diagnosis Date  . AAA (abdominal aortic aneurysm) (HCC)    3.4 cm  . Anemia   . Anxiety   . Aortic atherosclerosis (HCC)   . Arthritis   . Asthma   . CAD (coronary artery disease)    a. Nonobstructive CAD by cath 07/17/13.  . Cancer (HCC)   . Carotid artery disease (HCC)    Carotid US 8/22: Bilateral ICA 1-39; right subclavian stenosis; right thyroid nodule  . CHD (congenital heart disease)    pt unaware???  . Chest pain    a. Adm 10-07/2013 - CTA neg for PE/dissection, cath with nonobstructive disease, CP ?due to uncontrolled HTN vs coronary vasospasm.  . CHF (congestive heart failure) (HCC)   . Closed head injury with concussion   . COPD (chronic obstructive pulmonary  disease) (HCC)   . Coronary artery disease   . Echocardiogram    Echocardiogram 06/2019: EF 60-65, impaired relaxation (Gr 1 DD), mild MAC, mild MR, mild to mod aortic valve sclerosis (no AS), trivial TR, mild LAE, normal RVSF, RVSP 30.7 (mildly elevated)  . GERD (gastroesophageal reflux disease)   . History of blood transfusion    childbirth, Hysterectomy  . Hyperlipidemia   . Hypertension   . Neuropathy   . OA (osteoarthritis)   . Osteoporosis   . PAD (peripheral artery disease) (HCC)    a. s/p L fem-pop bypass b. recent evaluation at [facility], unamenable to intervention  . PAD (peripheral artery disease) (HCC)   . Palpitations 02/22/2016  . Pneumonia   . Pre-diabetes   . Premature ventricular contraction   . PUD (peptic ulcer disease)   . PVD (peripheral vascular disease) (HCC) 07/17/2013  . Thoracic aortic aneurysm (HCC)    Chest/aorta CTA 04/2022: 14 x 8 focal saccular aneurysm versus penetrating atherosclerotic ulcer involving left side of proximal portion of transverse aortic arch; descending thoracic aorta and proximal abdominal aorta aneurysm 4.4 cm; aortic atherosclerosis; emphysema  . Tobacco abuse   . Wears dentures   . Wears glasses    Past Surgical History:  Procedure Laterality Date  . ABDOMINAL AORTOGRAM W/LOWER EXTREMITY N/A 03/09/2020   Procedure: ABDOMINAL AORTOGRAM W/LOWER EXTREMITY;  Surgeon: Cephus Shelling, MD;  Location: [facility] INVASIVE CV LAB;  Service: Cardiovascular;  Laterality: N/A;  . ABDOMINAL AORTOGRAM W/LOWER EXTREMITY N/A 07/26/2022   Procedure: ABDOMINAL AORTOGRAM W/LOWER EXTREMITY;  Surgeon: Cephus Shelling, MD;  Location: [facility] INVASIVE CV LAB;  Service: Cardiovascular;  Laterality: N/A;  . ABDOMINAL AORTOGRAM W/LOWER EXTREMITY N/A 09/20/2022   Procedure: ABDOMINAL AORTOGRAM W/LOWER EXTREMITY;  Surgeon: Cephus Shelling, MD;  Location: [facility] INVASIVE CV LAB;  Service: Cardiovascular;  Laterality: N/A;  . ABDOMINAL HYSTERECTOMY    . BREAST SURGERY      left cyst excision x 2  . CARDIAC CATHETERIZATION     several years ago, nonobstructive, "50-60% blockages"  . DIRECT LARYNGOSCOPY Bilateral 03/13/2023   Procedure: MICRO DIRECT LARYNGOSCOPY WITH BIOPSY;  Surgeon: Christia Reading, MD;  Location: [facility] OR;  Service: ENT;  Laterality: Bilateral;  . ENDARTERECTOMY FEMORAL Right 03/18/2020   Procedure: ENDARTERECTOMY FEMORAL WITH PROFUNDAPLASTY;  Surgeon: Cephus Shelling, MD;  Location: [facility] OR;  Service: Vascular;  Laterality: Right;  . FEMORAL-POPLITEAL BYPASS GRAFT Right 03/18/2020   Procedure: BYPASS GRAFT FEMORAL-POPLITEAL ARTERY using GORE PROPATEN VASCULAR GRAFT;  Surgeon: Cephus Shelling, MD;  Location: [facility] OR;  Service: Vascular;  Laterality: Right;  . HIP SURGERY     left - bars and screws placed  . HIP SURGERY    . INSERTION OF ILIAC STENT Right 03/18/2020   Procedure: INSERTION OF GORE VIABAHN  ILIAC STENT;  Surgeon: Cephus Shelling, MD;  Location: [facility] OR;  Service: Vascular;  Laterality: Right;  . LEFT HEART CATHETERIZATION WITH CORONARY ANGIOGRAM N/A 07/17/2013   Procedure: LEFT HEART CATHETERIZATION WITH CORONARY ANGIOGRAM;  Surgeon: Laurey Morale, MD;  Location: [facility] CATH LAB;  Service: Cardiovascular;  Laterality: N/A;  . LOWER EXTREMITY ANGIOGRAM Right 03/18/2020   Procedure: RIGHT ILIAC ARTERIOGRAM WITH AN ILIAC STENT;  Surgeon: Cephus Shelling, MD;  Location: [facility] OR;  Service: Vascular;  Laterality: Right;  . MULTIPLE TOOTH EXTRACTIONS    . PATCH ANGIOPLASTY Right 03/18/2020   Procedure: PATCH ANGIOPLASTY using a XENOSURE BIOLOGIC PATCH OF THE PROFUNDA;  Surgeon: Cephus Shelling, MD;  Location: [facility] OR;  Service: Vascular;  Laterality: Right;  . PERIPHERAL VASCULAR CATHETERIZATION N/A 07/16/2016   Procedure: Abdominal Aortogram w/Lower Extremity;  Surgeon: Larina Earthly, MD;  Location: [facility] INVASIVE CV LAB;  Service: Cardiovascular;  Laterality: N/A;  . PERIPHERAL VASCULAR INTERVENTION Left 03/09/2020   Procedure: PERIPHERAL  VASCULAR INTERVENTION;  Surgeon: Cephus Shelling, MD;  Location: [facility] INVASIVE CV LAB;  Service: Cardiovascular;  Laterality: Left;  common and external illiac   . ROTATOR CUFF REPAIR     right side, had torn ligaments as well  . TUBAL LIGATION    . VEIN BYPASS SURGERY     left leg  . WRIST ARTHROCENTESIS     left - broke in 3 places so has bars and screws placed   Social History:  reports that she has been smoking cigarettes. She has a 28.5 pack-year smoking history. She has been exposed to tobacco smoke. She has never used smokeless tobacco. She reports that she does not drink alcohol and does not use drugs.  Allergies  Allergen Reactions  . Bee Venom Anaphylaxis and Other (See Comments)    Welts, also    Insect bites- Welts, also  . Insect Extract Anaphylaxis and Other (See Comments)    Insect bites- Welts, also  . Marcelline Deist [Dapagliflozin] Nausea And Vomiting  . Lyrica [Pregabalin] Nausea And Vomiting  .  Neurontin [Gabapentin] Nausea And Vomiting and Other (See Comments)    Hypertension/dizziness  . Nsaids Nausea Only and Other (See Comments)    Patient is not suppose to have this class of medication (liver)  Can tolerate 81mg , which she is taking  . Latex Rash and Other (See Comments)    Family History  Problem Relation Age of Onset  . Cervical cancer Mother   . Heart disease Mother        MI in 3s  . Liver cancer Father   . Heart disease Father   . Lung cancer Maternal Aunt   . Prostate cancer Maternal Uncle        met. anal  . Breast cancer Maternal Grandmother   . Lung cancer Maternal Aunt   . Lung cancer Maternal Aunt   . Stomach cancer Maternal Aunt   . Colon cancer Maternal Aunt   . Lung cancer Maternal Uncle     Prior to Admission medications   Medication Sig Start Date End Date Taking? Authorizing Provider  albuterol (PROVENTIL HFA;VENTOLIN HFA) 108 (90 BASE) MCG/ACT inhaler Inhale 2 puffs into the lungs every 6 (six) hours as needed for wheezing or  shortness of breath.   Yes [provider]  albuterol (PROVENTIL) (2.5 MG/3ML) 0.083% nebulizer solution Take 2.5 mg by nebulization every 6 (six) hours as needed for wheezing or shortness of breath.   Yes [provider]  alendronate (FOSAMAX) 70 MG tablet Take 70 mg by mouth every Wednesday. 02/03/16  Yes [provider]  allopurinol (ZYLOPRIM) 300 MG tablet Take 300 mg by mouth every evening. 08/02/17  Yes [provider]  aspirin EC 81 MG tablet Take 81 mg by mouth in the morning.   Yes [provider]  atorvastatin (LIPITOR) 80 MG tablet Take 80 mg by mouth at bedtime. 02/04/20  Yes [provider]  calcium carbonate (ANTACID) 500 MG chewable tablet Chew 1 tablet by mouth 2 (two) times daily as needed for heartburn or indigestion.   Yes [provider]  Cholecalciferol (VITAMIN D) 125 MCG (5000 UT) CAPS Take 5,000 Units by mouth in the morning.   Yes [provider]  clopidogrel (PLAVIX) 75 MG tablet Take 1 tablet (75 mg total) by mouth daily. Patient taking differently: Take 75 mg by mouth at bedtime. 07/27/22 07/27/23 Yes Lars Mage, PA-C  cyanocobalamin (VITAMIN B12) 1000 MCG tablet Take 1,000 mcg by mouth daily.   Yes [provider]  ezetimibe (ZETIA) 10 MG tablet Take 1 tablet (10 mg total) by mouth daily. Patient taking differently: Take 10 mg by mouth at bedtime. 03/29/23  Yes Meriam Sprague, MD  furosemide (LASIX) 40 MG tablet Take 1 tablet (40 mg total) by mouth daily. 09/12/22  Yes Meriam Sprague, MD  lactulose (CHRONULAC) 10 GM/15ML solution Take 20 g by mouth 2 (two) times daily as needed for moderate constipation. 10/29/19  Yes [provider]  melatonin 5 MG TABS Take 5 mg by mouth at bedtime.   Yes [provider]  Menthol-Methyl Salicylate (MUSCLE RUB) 10-15 % CREA Apply 1 application topically daily as needed (for arthritis pain).   Yes [provider]   nitroGLYCERIN (NITROSTAT) 0.4 MG SL tablet Place 1 tablet (0.4 mg total) under the tongue every 5 (five) minutes x 3 doses as needed for chest pain. 04/26/16  Yes Seiler, Amber K, NP  Oxycodone HCl 10 MG TABS Take 1 tablet (10 mg total) by mouth 4 (four) times daily  as needed (for pain). 03/22/20  Yes Emilie Rutter, PA-C  pantoprazole (PROTONIX) 40 MG tablet Take 40 mg by mouth 2 (two) times daily.   Yes [provider]  potassium chloride SA (KLOR-CON M20) 20 MEQ tablet Take 1 tablet (20 mEq total) by mouth daily. 02/18/23  Yes Pemberton, Kathlynn Grate, MD  senna-docusate (SENOKOT-S) 8.6-50 MG tablet Take 2 tablets by mouth at bedtime.   Yes [provider]  SPIRIVA RESPIMAT 2.5 MCG/ACT AERS Take 2 puffs by mouth daily. 04/03/23  Yes [provider]  spironolactone (ALDACTONE) 25 MG tablet Take 1 tablet (25 mg total) by mouth daily. 09/03/17  Yes Lars Masson, MD  verapamil (CALAN-SR) 240 MG CR tablet Take 1 tablet (240 mg total) by mouth daily. 03/29/23  Yes Pemberton, Kathlynn Grate, MD  EPIPEN 2-PAK 0.3 MG/0.3ML SOAJ injection Inject 0.3 mg into the muscle daily as needed for anaphylaxis. Use as directed as needed for allergies. Patient not taking: Reported on 04/06/2023 12/14/15   [provider]    Physical Exam: Vitals:   04/06/23 1657 04/06/23 1658 04/06/23 1930 04/06/23 2126  BP:   (!) 154/87   Pulse:   (!) 113   Resp:   (!) 21   Temp: 98.7 F (37.1 C)   99.2 F (37.3 C)  TempSrc: Oral   Oral  SpO2:  93% 93%    Constitutional: NAD, calm, comfortable Respiratory: BS diminished throughout, but decent air movement, no wheezing audible. Cardiovascular: Tachycardic Abdomen: no tenderness, no masses palpated. No hepatosplenomegaly. Bowel sounds positive.  Neurologic: CN 2-12 grossly intact. Sensation intact, DTR normal. Strength 5/5 in all 4.  Psychiatric: Normal judgment and insight. Alert and oriented x 3. Normal mood.   Data Reviewed: {Tip this will  not be part of the note when signed- Document your independent interpretation of telemetry tracing, EKG, lab, Radiology test or any other diagnostic tests. Add any new diagnostic test ordered today. (Optional):[id number]}   Labs on Admission: I have personally reviewed following labs and imaging studies  CBC: Recent Labs  Lab 04/06/23 1755  WBC 10.5  HGB 12.9  HCT 38.3  MCV 97.2  PLT 244   Basic Metabolic Panel: Recent Labs  Lab 04/06/23 1755  NA 137  K 3.1*  CL 100  CO2 26  GLUCOSE 100*  BUN 10  CREATININE 0.68  CALCIUM 8.6*   GFR: Estimated Creatinine Clearance: 57.5 mL/min (by C-G formula based on SCr of 0.68 mg/dL). Liver Function Tests: Recent Labs  Lab 04/06/23 1755  AST 23  ALT 20  ALKPHOS 47  BILITOT 0.7  PROT 6.4*  ALBUMIN 3.1*   No results for input(s): "LIPASE", "AMYLASE" in the last 168 hours. No results for input(s): "AMMONIA" in the last 168 hours. Coagulation Profile: No results for input(s): "INR", "PROTIME" in the last 168 hours. Cardiac Enzymes: No results for input(s): "CKTOTAL", "CKMB", "CKMBINDEX", "TROPONINI" in the last 168 hours. BNP (last 3 results) No results for input(s): "PROBNP" in the last 8760 hours. HbA1C: No results for input(s): "HGBA1C" in the last 72 hours. CBG: No results for input(s): "GLUCAP" in the last 168 hours. Lipid Profile: No results for input(s): "CHOL", "HDL", "LDLCALC", "TRIG", "CHOLHDL", "LDLDIRECT" in the last 72 hours. Thyroid Function Tests: No results for input(s): "TSH", "T4TOTAL", "FREET4", "T3FREE", "THYROIDAB" in the last 72 hours. Anemia Panel: No results for input(s): "VITAMINB12", "FOLATE", "FERRITIN", "TIBC", "IRON", "RETICCTPCT" in the last 72 hours. Urine analysis:    Component Value Date/Time   COLORURINE STRAW (  A) 03/15/2020 1007   APPEARANCEUR CLEAR 03/15/2020 1007   LABSPEC 1.005 03/15/2020 1007   PHURINE 6.0 03/15/2020 1007   GLUCOSEU NEGATIVE 03/15/2020 1007   HGBUR SMALL (A) 03/15/2020  1007   BILIRUBINUR NEGATIVE 03/15/2020 1007   KETONESUR NEGATIVE 03/15/2020 1007   PROTEINUR NEGATIVE 03/15/2020 1007   NITRITE NEGATIVE 03/15/2020 1007   LEUKOCYTESUR NEGATIVE 03/15/2020 1007    Radiological Exams on Admission: CT Angio Chest PE W and/or Wo Contrast  Result Date: 04/06/2023 CLINICAL DATA:  Positive D-dimer and shortness of breath, initial encounter EXAM: CT ANGIOGRAPHY CHEST WITH CONTRAST TECHNIQUE: Multidetector CT imaging of the chest was performed using the standard protocol during bolus administration of intravenous contrast. Multiplanar CT image reconstructions and MIPs were obtained to evaluate the vascular anatomy. RADIATION DOSE REDUCTION: This exam was performed according to the departmental dose-optimization program which includes automated exposure control, adjustment of the mA and/or kV according to patient size and/or use of iterative reconstruction technique. CONTRAST:  75mL OMNIPAQUE IOHEXOL 350 MG/ML SOLN COMPARISON:  Chest x-ray from earlier in the same day, CT from 04/20/2022 FINDINGS: Cardiovascular: Atherosclerotic calcifications of the thoracic aorta are noted. No ascending aneurysmal dilatation is seen. No significant aortic opacification is noted to suggest dissection. Descending aorta demonstrates stable dilatation at the level of the aortic hiatus measuring up to 4.5 cm. This is relatively stable from the prior exam. Heart is mildly enlarged in size. The pulmonary artery shows a normal branching pattern bilaterally. No filling defect to suggest pulmonary embolism is noted. Scattered coronary calcifications are noted. 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 Mild contour deformity is noted suggesting stability. Mediastinum/Nodes: Thoracic inlet is within normal limits. No sizable hilar or mediastinal adenopathy is noted. Calcified lymph nodes are  noted within the hila and mediastinum consistent with prior granulomatous disease. Lungs/Pleura: Lungs are well aerated bilaterally. Scattered calcified granulomas are seen. Mild emphysematous change is noted. Mild right basilar atelectasis is noted. Upper Abdomen: Visualized upper abdomen shows calcified splenic granulomas. No other focal abnormality is seen. Vascular calcifications are noted. Musculoskeletal: No acute rib abnormality is noted. Degenerative changes of the thoracic spine are seen. No compression deformity is noted. Review of the MIP images confirms the above findings. IMPRESSION: No evidence of pulmonary emboli. 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 The overall morphology of the aorta is stable however. Changes consistent with prior granulomatous disease. Aortic Atherosclerosis (ICD10-I70.0) and Emphysema (ICD10-J43.9). Electronically Signed   By: Alcide Clever M.D.   On: 04/06/2023 20:40   DG Chest Port 1 View  Result Date: 04/06/2023 CLINICAL DATA:  Shortness of breath EXAM: PORTABLE CHEST 1 VIEW COMPARISON:  Chest x-ray 03/18/2019 FINDINGS: There some atelectasis in the lung bases. There is a small left pleural effusion. There is no pneumothorax or acute fracture. The heart is mildly enlarged, unchanged. IMPRESSION: 1. Small left pleural effusion. 2. Bibasilar atelectasis. Electronically Signed   By: Darliss Cheney M.D.   On: 04/06/2023 17:27    EKG: Independently reviewed.   Assessment and Plan: * NSTEMI (non-ST elevated myocardial infarction) (HCC) Suspicious for NSTEMI with SOB being anginal equivalent.  Pt does have chest pressure when she gets up and moves.  Symptoms (including SOB and chest pressure) seem to have improved significantly after SL NTG x1 (when neb treatments and other  interventions havent helped much), this further raises suspicion  for a cardiac / coronary event. Trop has risen since presentation from 34 to 112 CTA chest is neg for PE No contrast in aorta, but aorta appears grossly unchanged from prior exams per radiologist. NSTEMI pathway Tele monitor Cards coming to evaluate: Recd heparin gtt which has been started SL NTG PRN for the moment Giving 5mg  IV metoprolol x1 now for persistent tachycardia to 120s-130s with BP 160/100 currently. Cont lipitor Got ASA in ED, cont ASA 81 daily Cont at bedtime plavix which she takes for PAD NPO after MN  Laryngeal cancer [facility]) Recent diagnosis on biopsy by ENT. EDP d/w Dr. 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 Dr. Jenne Pane notes that the mass was very small on scope He isnt concerned that mass could be occluding airway at this time, doubts that this is cause of her presentation today.  COPD (chronic obstructive pulmonary disease) (HCC) Pt with known COPD and ongoing smoking.  However: no wheezing on todays presentation, and current room O2 sat of 88%, while low, does appear c/w her baseline as documented by cardiologist office note x2 days ago. COPD exacerbation is possible but seems less likely than cardiac cause of presentation today. Cont home scheduled and PRN nebs  Chronic diastolic CHF (congestive heart failure) (HCC) BNP slightly elevated at 265 today, but no evidence of pulmonary edema or volume overload on imaging studies. Dont think CHF exacerbation is responsible for todays presentation either. Cards coming to see patient as well Hold home diuretics until they can evaluate and make recs.  Hypertension Got SL NTG x1 in ED Getting metoprolol 5mg  IV X1 now as well Will hold home verapamil until cardiology can assess and make recs.      Advance Care Planning:   Code Status: Full Code  Consults: Cards  Family Communication: Daughter at bedside  Severity of Illness: The appropriate patient status for this patient is  OBSERVATION. Observation status is judged to be reasonable and necessary in order to provide the required intensity of service to ensure the patient's safety. The patient's presenting symptoms, physical exam findings, and initial radiographic and laboratory data in the context of their medical condition is felt to place them at decreased risk for further clinical deterioration. Furthermore, it is anticipated that the patient will be medically stable for discharge from the hospital within 2 midnights of admission.   Author: Hillary Bow., DO 04/06/2023 11:47 PM  For on call review [email or web address].

## 2023-04-06 NOTE — Consult Note (Signed)
Cardiology Consultation:   Patient ID: Marie Johnson MRN: [id number]; DOB: 04/17/1951  Admit date: 04/06/2023 Date of Consult: 04/06/2023  Primary Care Provider: Ailene Ravel, MD Primary Cardiologist: Meriam Sprague, MD *** Primary Electrophysiologist:  Hillis Range, MD (Inactive) ***   Patient Profile:   Marie Johnson is a 72 y.o. female with a hx of *** who is being seen today for the evaluation of *** at the request of ***.  History of Present Illness:   Marie Johnson is a 72 y.o. female who ***   In the emergency department, ***     Past Medical History:  Diagnosis Date   AAA (abdominal aortic aneurysm) (HCC)    3.4 cm   Anemia    Anxiety    Aortic atherosclerosis (HCC)    Arthritis    Asthma    CAD (coronary artery disease)    a. Nonobstructive CAD by cath 07/17/13.   Cancer [facility])    Carotid artery disease (HCC)    Carotid US 8/22: Bilateral ICA 1-39; right subclavian stenosis; right thyroid nodule   CHD (congenital heart disease)    pt unaware???   Chest pain    a. Adm 10-07/2013 - CTA neg for PE/dissection, cath with nonobstructive disease, CP ?due to uncontrolled HTN vs coronary vasospasm.   CHF (congestive heart failure) (HCC)    Closed head injury with concussion    COPD (chronic obstructive pulmonary disease) (HCC)    Coronary artery disease    Echocardiogram    Echocardiogram 06/2019: EF 60-65, impaired relaxation (Gr 1 DD), mild MAC, mild MR, mild to mod aortic valve sclerosis (no AS), trivial TR, mild LAE, normal RVSF, RVSP 30.7 (mildly elevated)   GERD (gastroesophageal reflux disease)    History of blood transfusion    childbirth, Hysterectomy   Hyperlipidemia    Hypertension    Neuropathy    OA (osteoarthritis)    Osteoporosis    PAD (peripheral artery disease) (HCC)    a. s/p L fem-pop bypass b. recent evaluation at [facility], unamenable to intervention   PAD (peripheral artery disease) (HCC)    Palpitations 02/22/2016   Pneumonia     Pre-diabetes    Premature ventricular contraction    PUD (peptic ulcer disease)    PVD (peripheral vascular disease) (HCC) 07/17/2013   Thoracic aortic aneurysm (HCC)    Chest/aorta CTA 04/2022: 14 x 8 focal saccular aneurysm versus penetrating atherosclerotic ulcer involving left side of proximal portion of transverse aortic arch; descending thoracic aorta and proximal abdominal aorta aneurysm 4.4 cm; aortic atherosclerosis; emphysema   Tobacco abuse    Wears dentures    Wears glasses      Past Surgical History:  Procedure Laterality Date   ABDOMINAL AORTOGRAM W/LOWER EXTREMITY N/A 03/09/2020   Procedure: ABDOMINAL AORTOGRAM W/LOWER EXTREMITY;  Surgeon: Cephus Shelling, MD;  Location: [facility] INV[facility];  Service: Cardiovascular;  Laterality: N/A;   ABDOMINAL AORTOGRAM W/LOWER EXTREMITY N/A 07/26/2022   Procedure: ABDOMINAL AORTOGRAM W/LOWER EXTREMITY;  Surgeon: Cephus Shelling, MD;  Location: [facility];  Service: Cardiovascular;  Laterality: N/A;   ABDOMINAL AORTOGRAM W/LOWER EXTREMITY N/A 09/20/2022   Procedure: ABDOMINAL AORTOGRAM W/LOWER EXTREMITY;  Surgeon: Cephus Shelling, MD;  Location: [facility];  Service: Cardiovascular;  Laterality: N/A;   ABDOMINAL HYSTERECTOMY     BREAST SURGERY     left cyst excision x 2   CARDIAC CATHETERIZATION     several years ago, nonobstructive, "50-60% blockages"  DIRECT LARYNGOSCOPY Bilateral 03/13/2023   Procedure: MICRO DIRECT LARYNGOSCOPY WITH BIOPSY;  Surgeon: Christia Reading, MD;  Location: [facility] OR;  Service: ENT;  Laterality: Bilateral;   ENDARTERECTOMY FEMORAL Right 03/18/2020   Procedure: ENDARTERECTOMY FEMORAL WITH PROFUNDAPLASTY;  Surgeon: Cephus Shelling, MD;  Location: [facility] OR;  Service: Vascular;  Laterality: Right;   FEMORAL-POPLITEAL BYPASS GRAFT Right 03/18/2020   Procedure: BYPASS GRAFT FEMORAL-POPLITEAL ARTERY using GORE PROPATEN VASCULAR GRAFT;  Surgeon: Cephus Shelling, MD;  Location: [facility] OR;  Service:  Vascular;  Laterality: Right;   HIP SURGERY     left - bars and screws placed   HIP SURGERY     INSERTION OF ILIAC STENT Right 03/18/2020   Procedure: INSERTION OF GORE VIABAHN  ILIAC STENT;  Surgeon: Cephus Shelling, MD;  Location: [facility] OR;  Service: Vascular;  Laterality: Right;   LEFT HEART CATHETERIZATION WITH CORONARY ANGIOGRAM N/A 07/17/2013   Procedure: LEFT HEART CATHETERIZATION WITH CORONARY ANGIOGRAM;  Surgeon: Laurey Morale, MD;  Location: [facility] CATH LAB;  Service: Cardiovascular;  Laterality: N/A;   LOWER EXTREMITY ANGIOGRAM Right 03/18/2020   Procedure: RIGHT ILIAC ARTERIOGRAM WITH AN ILIAC STENT;  Surgeon: Cephus Shelling, MD;  Location: [facility] OR;  Service: Vascular;  Laterality: Right;   MULTIPLE TOOTH EXTRACTIONS     PATCH ANGIOPLASTY Right 03/18/2020   Procedure: PATCH ANGIOPLASTY using a Livia Snellen BIOLOGIC PATCH OF THE PROFUNDA;  Surgeon: Cephus Shelling, MD;  Location: [facility] OR;  Service: Vascular;  Laterality: Right;   PERIPHERAL VASCULAR CATHETERIZATION N/A 07/16/2016   Procedure: Abdominal Aortogram w/Lower Extremity;  Surgeon: Larina Earthly, MD;  Location: [facility] INVASIVE CV LAB;  Service: Cardiovascular;  Laterality: N/A;   PERIPHERAL VASCULAR INTERVENTION Left 03/09/2020   Procedure: PERIPHERAL VASCULAR INTERVENTION;  Surgeon: Cephus Shelling, MD;  Location: [facility];  Service: Cardiovascular;  Laterality: Left;  common and external illiac    ROTATOR CUFF REPAIR     right side, had torn ligaments as well   TUBAL LIGATION     VEIN BYPASS SURGERY     left leg   WRIST ARTHROCENTESIS     left - broke in 3 places so has bars and screws placed     {Home Medications (Optional):21181}  Inpatient Medications: Scheduled Meds:  aspirin  324 mg Oral Once   heparin  3,000 Units Intravenous Once   potassium chloride  40 mEq Oral Once   Continuous Infusions:  heparin     PRN Meds:   Allergies:    Allergies  Allergen Reactions   Bee Venom Anaphylaxis and  Other (See Comments)    Welts, also    Insect bites- Welts, also   Insect Extract Anaphylaxis and Other (See Comments)    Insect bites- Welts, also   Farxiga [Dapagliflozin] Nausea And Vomiting   Lyrica [Pregabalin] Nausea And Vomiting   Neurontin [Gabapentin] Nausea And Vomiting and Other (See Comments)    Hypertension/dizziness   Nsaids Nausea Only and Other (See Comments)    Patient is not suppose to have this class of medication (liver)  Can tolerate 81mg , which she is taking   Latex Rash and Other (See Comments)    Social History:   Social History   Socioeconomic History   Marital status: Married    Spouse name: Not on file   Number of children: Not on file   Years of education: Not on file   Highest education level: Not on file  Occupational History   Not on  file  Tobacco Use   Smoking status: Every Day    Current packs/day: 0.50    Average packs/day: 0.5 packs/day for 57.0 years (28.5 ttl pk-yrs)    Types: Cigarettes    Passive exposure: Current (Spouse is a smoker)   Smokeless tobacco: Never  Vaping Use   Vaping status: Never Used  Substance and Sexual Activity   Alcohol use: Never   Drug use: Never   Sexual activity: Not on file  Other Topics Concern   Not on file  Social History Narrative   ** Merged History Encounter **       Pt lives in [place] with husband.  Retired Child psychotherapist   Social Determinants of Corporate investment banker Strain: Not on [employer] Insecurity: Low Risk  (12/31/2022)   [facility]   Food vital sign    Within the past 12 months, you worried that your food would run out before you got money to buy more: Never true    Within the past 12 months, the food you bought just didn't last and you didn't have money to get more. : Never true  Transportation Needs: No Transportation Needs (12/31/2022)   [facility]   Transportation    In the past 12 months, has lack of reliable  transportation kept you from medical appointments, meetings, work or from getting things needed for daily living? : No  Physical Activity: Not on file  Stress: Not on file  Social Connections: Not on file  Intimate Partner Violence: Not on file    Family History:   *** Family History  Problem Relation Age of Onset   Cervical cancer Mother    Heart disease Mother        MI in 37s   Liver cancer Father    Heart disease Father    Lung cancer Maternal Aunt    Prostate cancer Maternal Uncle        met. anal   Breast cancer Maternal Grandmother    Lung cancer Maternal Aunt    Lung cancer Maternal Aunt    Stomach cancer Maternal Aunt    Colon cancer Maternal Aunt    Lung cancer Maternal Uncle      Review of Systems: All other review of systems are negative unless otherwise noted in the HPI as above.   Physical Exam/Data:   Vitals:   04/06/23 1657 04/06/23 1658 04/06/23 1930 04/06/23 2126  BP:   (!) 154/87   Pulse:   (!) 113   Resp:   (!) 21   Temp: 98.7 F (37.1 C)   99.2 F (37.3 C)  TempSrc: Oral   Oral  SpO2:  93% 93%    No intake or output data in the 24 hours ending 04/06/23 2157 There were no vitals filed for this visit. There is no height or weight on file to calculate BMI.  {Physical FAOZ:3086578}  EKG:  The EKG was personally reviewed and demonstrates:  *** Telemetry:  Telemetry was personally reviewed and demonstrates:  ***  Relevant CV Studies: ***  Laboratory Data:  Chemistry Recent Labs  Lab 04/06/23 1755  NA 137  K 3.1*  CL 100  CO2 26  GLUCOSE 100*  BUN 10  CREATININE 0.68  CALCIUM 8.6*  GFRNONAA >60  ANIONGAP 11    Recent Labs  Lab 04/06/23 1755  PROT 6.4*  ALBUMIN 3.1*  AST 23  ALT 20  ALKPHOS 47  BILITOT 0.7   Hematology Recent Labs  Lab 04/06/23 1755  WBC 10.5  RBC 3.94  HGB 12.9  HCT 38.3  MCV 97.2  MCH 32.7  MCHC 33.7  RDW 21.2*  PLT 244   Cardiac EnzymesNo results for input(s): "TROPONINI" in the last 168  hours. No results for input(s): "TROPIPOC" in the last 168 hours.  BNP Recent Labs  Lab 04/06/23 1756  BNP 265.9*    DDimer  Recent Labs  Lab 04/06/23 1755  DDIMER 1.22*    Radiology/Studies:  CT Angio Chest PE W and/or Wo Contrast  Result Date: 04/06/2023 CLINICAL DATA:  Positive D-dimer and shortness of breath, initial encounter EXAM: CT ANGIOGRAPHY CHEST WITH CONTRAST TECHNIQUE: Multidetector CT imaging of the chest was performed using the standard protocol during bolus administration of intravenous contrast. Multiplanar CT image reconstructions and MIPs were obtained to evaluate the vascular anatomy. RADIATION DOSE REDUCTION: This exam was performed according to the departmental dose-optimization program which includes automated exposure control, adjustment of the mA and/or kV according to patient size and/or use of iterative reconstruction technique. CONTRAST:  75mL OMNIPAQUE IOHEXOL 350 MG/ML SOLN COMPARISON:  Chest x-ray from earlier in the same day, CT from 04/20/2022 FINDINGS: Cardiovascular: Atherosclerotic calcifications of the thoracic aorta are noted. No ascending aneurysmal dilatation is seen. No significant aortic opacification is noted to suggest dissection. Descending aorta demonstrates stable dilatation at the level of the aortic hiatus measuring up to 4.5 cm. This is relatively stable from the prior exam. Heart is mildly enlarged in size. The pulmonary artery shows a normal branching pattern bilaterally. No filling defect to suggest pulmonary embolism is noted. Scattered coronary calcifications are noted. 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 Mild contour deformity is noted suggesting stability. Mediastinum/Nodes: Thoracic inlet is within normal limits. No sizable hilar or mediastinal adenopathy is noted. Calcified lymph nodes are noted within the hila and  mediastinum consistent with prior granulomatous disease. Lungs/Pleura: Lungs are well aerated bilaterally. Scattered calcified granulomas are seen. Mild emphysematous change is noted. Mild right basilar atelectasis is noted. Upper Abdomen: Visualized upper abdomen shows calcified splenic granulomas. No other focal abnormality is seen. Vascular calcifications are noted. Musculoskeletal: No acute rib abnormality is noted. Degenerative changes of the thoracic spine are seen. No compression deformity is noted. Review of the MIP images confirms the above findings. IMPRESSION: No evidence of pulmonary emboli. 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 The overall morphology of the aorta is stable however. Changes consistent with prior granulomatous disease. Aortic Atherosclerosis (ICD10-I70.0) and Emphysema (ICD10-J43.9). Electronically Signed   By: Alcide Clever M.D.   On: 04/06/2023 20:40   DG Chest Port 1 View  Result Date: 04/06/2023 CLINICAL DATA:  Shortness of breath EXAM: PORTABLE CHEST 1 VIEW COMPARISON:  Chest x-ray 03/18/2019 FINDINGS: There some atelectasis in the lung bases. There is a small left pleural effusion. There is no pneumothorax or acute fracture. The heart is mildly enlarged, unchanged. IMPRESSION: 1. Small left pleural effusion. 2. Bibasilar atelectasis. Electronically Signed   By: Darliss Cheney M.D.   On: 04/06/2023 17:27    Assessment and Plan:   *** ***  Recommendations: -*** -*** -***   {Are we signing off today?:210360402}  For questions or updates, please contact [facility] Please consult [email or web address] for contact info under  Signed, Joellen Jersey, MD  04/06/2023 9:57 PM

## 2023-04-06 NOTE — ED Notes (Signed)
Patient transported to CT 

## 2023-04-06 NOTE — Assessment & Plan Note (Signed)
Recent diagnosis on biopsy by ENT. EDP d/w Dr. 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 Dr. Jenne Pane notes that the mass was very small on scope He isnt concerned that mass could be occluding airway at this time, doubts that this is cause of her presentation today.

## 2023-04-06 NOTE — Assessment & Plan Note (Addendum)
Pt with known COPD and ongoing smoking.  However: no wheezing on todays presentation, and current room O2 sat of 88%, while low, does appear c/w her baseline as documented by cardiologist office note x2 days ago. COPD exacerbation is possible but seems less likely than cardiac cause of presentation today. Cont home scheduled and PRN nebs

## 2023-04-06 NOTE — Progress Notes (Signed)
ANTICOAGULATION CONSULT NOTE - Initial Consult  Pharmacy Consult for Heparin Indication: chest pain/ACS  Allergies  Allergen Reactions   Bee Venom Anaphylaxis and Other (See Comments)    Welts, also    Insect bites- Welts, also   Insect Extract Anaphylaxis and Other (See Comments)    Insect bites- Welts, also   Farxiga [Dapagliflozin] Nausea And Vomiting   Lyrica [Pregabalin] Nausea And Vomiting   Neurontin [Gabapentin] Nausea And Vomiting and Other (See Comments)    Hypertension/dizziness   Nsaids Nausea Only and Other (See Comments)    Patient is not suppose to have this class of medication (liver)  Can tolerate 81mg , which she is taking   Latex Rash and Other (See Comments)    Patient Measurements:   Heparin Dosing Weight: 58.2 kg  Vital Signs: Temp: 99.2 F (37.3 C) (07/20 2126) Temp Source: Oral (07/20 2126) BP: 154/87 (07/20 1930) Pulse Rate: 113 (07/20 1930)  Labs: Recent Labs    04/06/23 1755 04/06/23 1910  HGB 12.9  --   HCT 38.3  --   PLT 244  --   CREATININE 0.68  --   TROPONINIHS 62* 112*    Estimated Creatinine Clearance: 57.5 mL/min (by C-G formula based on SCr of 0.68 mg/dL).   Medical History: Past Medical History:  Diagnosis Date   AAA (abdominal aortic aneurysm) (HCC)    3.4 cm   Anemia    Anxiety    Aortic atherosclerosis (HCC)    Arthritis    Asthma    CAD (coronary artery disease)    a. Nonobstructive CAD by cath 07/17/13.   Cancer [facility])    Carotid artery disease (HCC)    Carotid US 8/22: Bilateral ICA 1-39; right subclavian stenosis; right thyroid nodule   CHD (congenital heart disease)    pt unaware???   Chest pain    a. Adm 10-07/2013 - CTA neg for PE/dissection, cath with nonobstructive disease, CP ?due to uncontrolled HTN vs coronary vasospasm.   CHF (congestive heart failure) (HCC)    Closed head injury with concussion    COPD (chronic obstructive pulmonary disease) (HCC)    Coronary artery disease    Echocardiogram     Echocardiogram 06/2019: EF 60-65, impaired relaxation (Gr 1 DD), mild MAC, mild MR, mild to mod aortic valve sclerosis (no AS), trivial TR, mild LAE, normal RVSF, RVSP 30.7 (mildly elevated)   GERD (gastroesophageal reflux disease)    History of blood transfusion    childbirth, Hysterectomy   Hyperlipidemia    Hypertension    Neuropathy    OA (osteoarthritis)    Osteoporosis    PAD (peripheral artery disease) (HCC)    a. s/p L fem-pop bypass b. recent evaluation at [facility], unamenable to intervention   PAD (peripheral artery disease) (HCC)    Palpitations 02/22/2016   Pneumonia    Pre-diabetes    Premature ventricular contraction    PUD (peptic ulcer disease)    PVD (peripheral vascular disease) (HCC) 07/17/2013   Thoracic aortic aneurysm (HCC)    Chest/aorta CTA 04/2022: 14 x 8 focal saccular aneurysm versus penetrating atherosclerotic ulcer involving left side of proximal portion of transverse aortic arch; descending thoracic aorta and proximal abdominal aorta aneurysm 4.4 cm; aortic atherosclerosis; emphysema   Tobacco abuse    Wears dentures    Wears glasses     Medications:  (Not in a hospital admission)  Scheduled:   aspirin  324 mg Oral Once   potassium chloride  40 mEq Oral Once  Infusions:  PRN:   Assessment: 85 yof with a history of HF, PAD s/p bypass, CAD, laryngeal cancer, COPD, AAA, HTN, HLD, tobacco use. Patient is presenting with SOB. Heparin per pharmacy consult placed for chest pain/ACS.  CTA PE negative for PE - stable dilatation of descending thoracic aorta noted - "overall morphology of aorta is stable"  Patient is not on anticoagulation prior to arrival.  Hgb 12.9; plt 244  Goal of Therapy:  Heparin level 0.3-0.7 units/ml Monitor platelets by anticoagulation protocol: Yes   Plan:  Give IV heparin 3000 units bolus x 1 Start heparin infusion at 700 units/hr Check anti-Xa level in 8 hours and daily while on heparin Continue to monitor H&H and  platelets  Delmar Landau, PharmD, BCPS 04/06/2023 9:34 PM ED Clinical Pharmacist -  [phone]

## 2023-04-06 NOTE — Assessment & Plan Note (Addendum)
Suspicious for NSTEMI with SOB being anginal equivalent.  Pt does have chest pressure when she gets up and moves.  Symptoms (including SOB and chest pressure) seem to have improved significantly after SL NTG x1 (when neb treatments and other interventions havent helped much), this further raises suspicion for a cardiac / coronary event. Trop has risen since presentation from 67 to 112 CTA chest is neg for PE No contrast in aorta, but aorta appears grossly unchanged from prior exams per radiologist. NSTEMI pathway Tele monitor Cards coming to evaluate: Recd heparin gtt which has been started SL NTG PRN for the moment Giving 5mg  IV metoprolol x1 now for persistent tachycardia to 120s-130s with BP 160/100 currently. Cont lipitor Got ASA in ED, cont ASA 81 daily Cont at bedtime plavix which she takes for PAD NPO after MN

## 2023-04-06 NOTE — ED Notes (Signed)
Admitting Provider at bedside. 

## 2023-04-06 NOTE — Consult Note (Incomplete)
Cardiology Consultation:   Patient ID: Marie Johnson MRN: [id number]; DOB: 05-Jun-1951  Admit date: 04/06/2023 Date of Consult: 04/06/2023  Primary Care Provider: Ailene Ravel, MD Primary Cardiologist: Meriam Sprague, MD  Primary Electrophysiologist:  Hillis Range, MD (Inactive)    Patient Profile:   Marie Johnson is a 72 y.o. female with a hx of CHF, nonobstructive CAD, peripheral artery disease status post lower extremity bypass, COPD, active smoker, and laryngeal cancer who is being seen today for the evaluation of shortness of breath at the request of emergency department hospital medicine.  History of Present Illness:   Marie Johnson is a 72 y.o. female who presents with worsening shortness of breath.  She has above medical problems including CHF, nonobstructive CAD, COPD, and laryngeal cancer.  Her shortness of breath began and worsened acutely over the hours prior to her arrival.  She has not had any other symptoms.  Initially she tried her inhalers at home which did not improve her symptoms much.  She had a recent biopsy of laryngeal cancer, however noted that upper airway was patent without any obstruction.   In the emergency department, ECG was without significant changes.  Laboratory evaluation revealed elevated troponin initially at 62 and repeat was 112.  She had a BNP of 265 and a D-dimer that was elevated.  She then underwent a CTA of her pulmonary arteries for a PE protocol that was negative for PE.  ENT was also consulted who thought that shortness of breath was not related to recent biopsy or laryngeal mass.  Thus cardiology was consulted for input as concerned this could be her anginal equivalent.     Past Medical History:  Diagnosis Date  . AAA (abdominal aortic aneurysm) (HCC)    3.4 cm  . Anemia   . Anxiety   . Aortic atherosclerosis (HCC)   . Arthritis   . Asthma   . CAD (coronary artery disease)    a. Nonobstructive CAD by cath 07/17/13.  . Cancer  (HCC)   . Carotid artery disease (HCC)    Carotid US 8/22: Bilateral ICA 1-39; right subclavian stenosis; right thyroid nodule  . CHD (congenital heart disease)    pt unaware???  . Chest pain    a. Adm 10-07/2013 - CTA neg for PE/dissection, cath with nonobstructive disease, CP ?due to uncontrolled HTN vs coronary vasospasm.  . CHF (congestive heart failure) (HCC)   . Closed head injury with concussion   . COPD (chronic obstructive pulmonary disease) (HCC)   . Coronary artery disease   . Echocardiogram    Echocardiogram 06/2019: EF 60-65, impaired relaxation (Gr 1 DD), mild MAC, mild MR, mild to mod aortic valve sclerosis (no AS), trivial TR, mild LAE, normal RVSF, RVSP 30.7 (mildly elevated)  . GERD (gastroesophageal reflux disease)   . History of blood transfusion    childbirth, Hysterectomy  . Hyperlipidemia   . Hypertension   . Neuropathy   . OA (osteoarthritis)   . Osteoporosis   . PAD (peripheral artery disease) (HCC)    a. s/p L fem-pop bypass b. recent evaluation at [facility], unamenable to intervention  . PAD (peripheral artery disease) (HCC)   . Palpitations 02/22/2016  . Pneumonia   . Pre-diabetes   . Premature ventricular contraction   . PUD (peptic ulcer disease)   . PVD (peripheral vascular disease) (HCC) 07/17/2013  . Thoracic aortic aneurysm (HCC)    Chest/aorta CTA 04/2022: 14 x 8 focal saccular aneurysm versus penetrating atherosclerotic ulcer  involving left side of proximal portion of transverse aortic arch; descending thoracic aorta and proximal abdominal aorta aneurysm 4.4 cm; aortic atherosclerosis; emphysema  . Tobacco abuse   . Wears dentures   . Wears glasses      Past Surgical History:  Procedure Laterality Date  . ABDOMINAL AORTOGRAM W/LOWER EXTREMITY N/A 03/09/2020   Procedure: ABDOMINAL AORTOGRAM W/LOWER EXTREMITY;  Surgeon: Cephus Shelling, MD;  Location: [facility] INVASIVE CV LAB;  Service: Cardiovascular;  Laterality: N/A;  . ABDOMINAL AORTOGRAM W/LOWER  EXTREMITY N/A 07/26/2022   Procedure: ABDOMINAL AORTOGRAM W/LOWER EXTREMITY;  Surgeon: Cephus Shelling, MD;  Location: [facility] INVASIVE CV LAB;  Service: Cardiovascular;  Laterality: N/A;  . ABDOMINAL AORTOGRAM W/LOWER EXTREMITY N/A 09/20/2022   Procedure: ABDOMINAL AORTOGRAM W/LOWER EXTREMITY;  Surgeon: Cephus Shelling, MD;  Location: [facility] INVASIVE CV LAB;  Service: Cardiovascular;  Laterality: N/A;  . ABDOMINAL HYSTERECTOMY    . BREAST SURGERY     left cyst excision x 2  . CARDIAC CATHETERIZATION     several years ago, nonobstructive, "50-60% blockages"  . DIRECT LARYNGOSCOPY Bilateral 03/13/2023   Procedure: MICRO DIRECT LARYNGOSCOPY WITH BIOPSY;  Surgeon: Christia Reading, MD;  Location: [facility] OR;  Service: ENT;  Laterality: Bilateral;  . ENDARTERECTOMY FEMORAL Right 03/18/2020   Procedure: ENDARTERECTOMY FEMORAL WITH PROFUNDAPLASTY;  Surgeon: Cephus Shelling, MD;  Location: [facility] OR;  Service: Vascular;  Laterality: Right;  . FEMORAL-POPLITEAL BYPASS GRAFT Right 03/18/2020   Procedure: BYPASS GRAFT FEMORAL-POPLITEAL ARTERY using GORE PROPATEN VASCULAR GRAFT;  Surgeon: Cephus Shelling, MD;  Location: [facility] OR;  Service: Vascular;  Laterality: Right;  . HIP SURGERY     left - bars and screws placed  . HIP SURGERY    . INSERTION OF ILIAC STENT Right 03/18/2020   Procedure: INSERTION OF GORE VIABAHN  ILIAC STENT;  Surgeon: Cephus Shelling, MD;  Location: [facility] OR;  Service: Vascular;  Laterality: Right;  . LEFT HEART CATHETERIZATION WITH CORONARY ANGIOGRAM N/A 07/17/2013   Procedure: LEFT HEART CATHETERIZATION WITH CORONARY ANGIOGRAM;  Surgeon: Laurey Morale, MD;  Location: [facility] CATH LAB;  Service: Cardiovascular;  Laterality: N/A;  . LOWER EXTREMITY ANGIOGRAM Right 03/18/2020   Procedure: RIGHT ILIAC ARTERIOGRAM WITH AN ILIAC STENT;  Surgeon: Cephus Shelling, MD;  Location: [facility] OR;  Service: Vascular;  Laterality: Right;  . MULTIPLE TOOTH EXTRACTIONS    . PATCH ANGIOPLASTY Right 03/18/2020    Procedure: PATCH ANGIOPLASTY using a XENOSURE BIOLOGIC PATCH OF THE PROFUNDA;  Surgeon: Cephus Shelling, MD;  Location: [facility] OR;  Service: Vascular;  Laterality: Right;  . PERIPHERAL VASCULAR CATHETERIZATION N/A 07/16/2016   Procedure: Abdominal Aortogram w/Lower Extremity;  Surgeon: Larina Earthly, MD;  Location: [facility] INVASIVE CV LAB;  Service: Cardiovascular;  Laterality: N/A;  . PERIPHERAL VASCULAR INTERVENTION Left 03/09/2020   Procedure: PERIPHERAL VASCULAR INTERVENTION;  Surgeon: Cephus Shelling, MD;  Location: [facility] INVASIVE CV LAB;  Service: Cardiovascular;  Laterality: Left;  common and external illiac   . ROTATOR CUFF REPAIR     right side, had torn ligaments as well  . TUBAL LIGATION    . VEIN BYPASS SURGERY     left leg  . WRIST ARTHROCENTESIS     left - broke in 3 places so has bars and screws placed     Home Medications:  Prior to Admission medications   Medication Sig Start Date End Date Taking? Authorizing Provider  albuterol (PROVENTIL HFA;VENTOLIN HFA) 108 (90 BASE) MCG/ACT inhaler Inhale 2 puffs into the  lungs every 6 (six) hours as needed for wheezing or shortness of breath.   Yes [provider]  albuterol (PROVENTIL) (2.5 MG/3ML) 0.083% nebulizer solution Take 2.5 mg by nebulization every 6 (six) hours as needed for wheezing or shortness of breath.   Yes [provider]  alendronate (FOSAMAX) 70 MG tablet Take 70 mg by mouth every Wednesday. 02/03/16  Yes [provider]  allopurinol (ZYLOPRIM) 300 MG tablet Take 300 mg by mouth every evening. 08/02/17  Yes [provider]  aspirin EC 81 MG tablet Take 81 mg by mouth in the morning.   Yes [provider]  atorvastatin (LIPITOR) 80 MG tablet Take 80 mg by mouth at bedtime. 02/04/20  Yes [provider]  calcium carbonate (ANTACID) 500 MG chewable tablet Chew 1 tablet by mouth 2 (two) times daily as needed for heartburn or indigestion.   Yes [provider]   Cholecalciferol (VITAMIN D) 125 MCG (5000 UT) CAPS Take 5,000 Units by mouth in the morning.   Yes [provider]  clopidogrel (PLAVIX) 75 MG tablet Take 1 tablet (75 mg total) by mouth daily. Patient taking differently: Take 75 mg by mouth at bedtime. 07/27/22 07/27/23 Yes Lars Mage, PA-C  cyanocobalamin (VITAMIN B12) 1000 MCG tablet Take 1,000 mcg by mouth daily.   Yes [provider]  ezetimibe (ZETIA) 10 MG tablet Take 1 tablet (10 mg total) by mouth daily. Patient taking differently: Take 10 mg by mouth at bedtime. 03/29/23  Yes Meriam Sprague, MD  furosemide (LASIX) 40 MG tablet Take 1 tablet (40 mg total) by mouth daily. 09/12/22  Yes Meriam Sprague, MD  lactulose (CHRONULAC) 10 GM/15ML solution Take 20 g by mouth 2 (two) times daily as needed for moderate constipation. 10/29/19  Yes [provider]  melatonin 5 MG TABS Take 5 mg by mouth at bedtime.   Yes [provider]  Menthol-Methyl Salicylate (MUSCLE RUB) 10-15 % CREA Apply 1 application topically daily as needed (for arthritis pain).   Yes [provider]  nitroGLYCERIN (NITROSTAT) 0.4 MG SL tablet Place 1 tablet (0.4 mg total) under the tongue every 5 (five) minutes x 3 doses as needed for chest pain. 04/26/16  Yes Seiler, Amber K, NP  Oxycodone HCl 10 MG TABS Take 1 tablet (10 mg total) by mouth 4 (four) times daily as needed (for pain). 03/22/20  Yes Emilie Rutter, PA-C  pantoprazole (PROTONIX) 40 MG tablet Take 40 mg by mouth 2 (two) times daily.   Yes [provider]  potassium chloride SA (KLOR-CON M20) 20 MEQ tablet Take 1 tablet (20 mEq total) by mouth daily. 02/18/23  Yes Pemberton, Kathlynn Grate, MD  senna-docusate (SENOKOT-S) 8.6-50 MG tablet Take 2 tablets by mouth at bedtime.   Yes [provider]  SPIRIVA RESPIMAT 2.5 MCG/ACT AERS Take 2 puffs by mouth daily. 04/03/23  Yes [provider]  spironolactone (ALDACTONE) 25 MG tablet Take 1  tablet (25 mg total) by mouth daily. 09/03/17  Yes Lars Masson, MD  verapamil (CALAN-SR) 240 MG CR tablet Take 1 tablet (240 mg total) by mouth daily. 03/29/23  Yes Pemberton, Kathlynn Grate, MD  EPIPEN 2-PAK 0.3 MG/0.3ML SOAJ injection Inject 0.3 mg into the muscle daily as needed for anaphylaxis. Use as directed as needed for allergies. Patient not taking: Reported on 04/06/2023 12/14/15   [provider]    Inpatient Medications: Scheduled Meds: . aspirin  324 mg Oral Once  . heparin  3,000 Units Intravenous Once  . potassium chloride  40 mEq Oral Once   Continuous Infusions: . heparin     PRN Meds:   Allergies:    Allergies  Allergen Reactions  . Bee Venom Anaphylaxis and Other (See Comments)    Welts, also    Insect bites- Welts, also  . Insect Extract Anaphylaxis and Other (See Comments)    Insect bites- Welts, also  . Marcelline Deist [Dapagliflozin] Nausea And Vomiting  . Lyrica [Pregabalin] Nausea And Vomiting  . Neurontin [Gabapentin] Nausea And Vomiting and Other (See Comments)    Hypertension/dizziness  . Nsaids Nausea Only and Other (See Comments)    Patient is not suppose to have this class of medication (liver)  Can tolerate 81mg , which she is taking  . Latex Rash and Other (See Comments)    Social History:   Social History   Socioeconomic History  . Marital status: Married    Spouse name: Not on file  . Number of children: Not on file  . Years of education: Not on file  . Highest education level: Not on file  Occupational History  . Not on file  Tobacco Use  . Smoking status: Every Day    Current packs/day: 0.50    Average packs/day: 0.5 packs/day for 57.0 years (28.5 ttl pk-yrs)    Types: Cigarettes    Passive exposure: Current (Spouse is a smoker)  . Smokeless tobacco: Never  Vaping Use  . Vaping status: Never Used  Substance and Sexual Activity  . Alcohol use: Never  . Drug use: Never  . Sexual activity: Not on file  Other Topics Concern   . Not on file  Social History Narrative   ** Merged History Encounter **       Pt lives in [place] with husband.  Retired Child psychotherapist   Social Determinants of Corporate investment banker Strain: Not on [employer] Insecurity: Low Risk  (12/31/2022)   Received from [facility]   Food vital sign   . Within the past 12 months, you worried that your food would run out before you got money to buy more: Never true   . Within the past 12 months, the food you bought just didn't last and you didn't have money to get more. : Never true  Transportation Needs: No Transportation Needs (12/31/2022)   Received from [facility]   Transportation   . In the past 12 months, has lack of reliable transportation kept you from medical appointments, meetings, work or from getting things needed for daily living? : No  Physical Activity: Not on file  Stress: Not on file  Social Connections: Not on file  Intimate Partner Violence: Not on file    Family History:    Family History  Problem Relation Age of Onset  . Cervical cancer Mother   . Heart disease Mother        MI in 9s  . Liver cancer Father   . Heart disease Father   . Lung cancer Maternal Aunt   . Prostate cancer Maternal Uncle        met. anal  . Breast cancer Maternal Grandmother   . Lung cancer Maternal Aunt   . Lung cancer Maternal Aunt   . Stomach cancer Maternal Aunt   . Colon cancer Maternal Aunt   . Lung cancer Maternal Uncle      Review of Systems: All other review of systems are negative unless otherwise  noted in the HPI as above.   Physical Exam/Data:   Vitals:   04/06/23 1657 04/06/23 1658 04/06/23 1930 04/06/23 2126  BP:   (!) 154/87   Pulse:   (!) 113   Resp:   (!) 21   Temp: 98.7 F (37.1 C)   99.2 F (37.3 C)  TempSrc: Oral   Oral  SpO2:  93% 93%    No intake or output data in the 24 hours ending 04/06/23 2157 There were no vitals filed for this visit. There is no height or  weight on file to calculate BMI.  {Physical LKGM:0102725}  EKG:  The EKG was personally reviewed and demonstrates: Nonspecific T wave changes Telemetry:  Telemetry was personally reviewed and demonstrates: Sinus tachycardia  Relevant CV Studies: Echo 03/10/2020:  1. Left ventricular ejection fraction, by estimation, is 55 to 60%. The  left ventricle has normal function. The left ventricle demonstrates  regional wall motion abnormalities (see scoring diagram/findings for  description). Basal to mid inferior  hypokinesis. There is mild left ventricular hypertrophy. Left ventricular  diastolic parameters are indeterminate.   2. Right ventricular systolic function is normal. The right ventricular  size is normal. Tricuspid regurgitation signal is inadequate for assessing  PA pressure.   3. The mitral valve is normal in structure. Trivial mitral valve  regurgitation.   4. The aortic valve is tricuspid. Aortic valve regurgitation is not  visualized. Mild to moderate aortic valve sclerosis/calcification is  present, without any evidence of aortic stenosis.   5. The inferior vena cava is normal in size with greater than 50%  respiratory variability, suggesting right atrial pressure of 3 mmHg.   Laboratory Data:  Chemistry Recent Labs  Lab 04/06/23 1755  NA 137  K 3.1*  CL 100  CO2 26  GLUCOSE 100*  BUN 10  CREATININE 0.68  CALCIUM 8.6*  GFRNONAA >60  ANIONGAP 11    Recent Labs  Lab 04/06/23 1755  PROT 6.4*  ALBUMIN 3.1*  AST 23  ALT 20  ALKPHOS 47  BILITOT 0.7   Hematology Recent Labs  Lab 04/06/23 1755  WBC 10.5  RBC 3.94  HGB 12.9  HCT 38.3  MCV 97.2  MCH 32.7  MCHC 33.7  RDW 21.2*  PLT 244   Cardiac EnzymesNo results for input(s): "TROPONINI" in the last 168 hours. No results for input(s): "TROPIPOC" in the last 168 hours.  BNP Recent Labs  Lab 04/06/23 1756  BNP 265.9*    DDimer  Recent Labs  Lab 04/06/23 1755  DDIMER 1.22*     Radiology/Studies:  CT Angio Chest PE W and/or Wo Contrast  Result Date: 04/06/2023 CLINICAL DATA:  Positive D-dimer and shortness of breath, initial encounter EXAM: CT ANGIOGRAPHY CHEST WITH CONTRAST TECHNIQUE: Multidetector CT imaging of the chest was performed using the standard protocol during bolus administration of intravenous contrast. Multiplanar CT image reconstructions and MIPs were obtained to evaluate the vascular anatomy. RADIATION DOSE REDUCTION: This exam was performed according to the departmental dose-optimization program which includes automated exposure control, adjustment of the mA and/or kV according to patient size and/or use of iterative reconstruction technique. CONTRAST:  75mL OMNIPAQUE IOHEXOL 350 MG/ML SOLN COMPARISON:  Chest x-ray from earlier in the same day, CT from 04/20/2022 FINDINGS: Cardiovascular: Atherosclerotic calcifications of the thoracic aorta are noted. No ascending aneurysmal dilatation is seen. No significant aortic opacification is noted to suggest dissection. Descending aorta demonstrates stable dilatation at the level of the aortic hiatus measuring up to 4.5  cm. This is relatively stable from the prior exam. Heart is mildly enlarged in size. The pulmonary artery shows a normal branching pattern bilaterally. No filling defect to suggest pulmonary embolism is noted. Scattered coronary calcifications are noted. 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 Mild contour deformity is noted suggesting stability. Mediastinum/Nodes: Thoracic inlet is within normal limits. No sizable hilar or mediastinal adenopathy is noted. Calcified lymph nodes are noted within the hila and mediastinum consistent with prior granulomatous disease. Lungs/Pleura: Lungs are well aerated bilaterally. Scattered calcified granulomas are seen. Mild emphysematous change is noted. Mild  right basilar atelectasis is noted. Upper Abdomen: Visualized upper abdomen shows calcified splenic granulomas. No other focal abnormality is seen. Vascular calcifications are noted. Musculoskeletal: No acute rib abnormality is noted. Degenerative changes of the thoracic spine are seen. No compression deformity is noted. Review of the MIP images confirms the above findings. IMPRESSION: No evidence of pulmonary emboli. 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 The overall morphology of the aorta is stable however. Changes consistent with prior granulomatous disease. Aortic Atherosclerosis (ICD10-I70.0) and Emphysema (ICD10-J43.9). Electronically Signed   By: Alcide Clever M.D.   On: 04/06/2023 20:40   DG Chest Port 1 View  Result Date: 04/06/2023 CLINICAL DATA:  Shortness of breath EXAM: PORTABLE CHEST 1 VIEW COMPARISON:  Chest x-ray 03/18/2019 FINDINGS: There some atelectasis in the lung bases. There is a small left pleural effusion. There is no pneumothorax or acute fracture. The heart is mildly enlarged, unchanged. IMPRESSION: 1. Small left pleural effusion. 2. Bibasilar atelectasis. Electronically Signed   By: Darliss Cheney M.D.   On: 04/06/2023 17:27    Assessment and Plan:   NSTEMI Given that the patient was ruled out for pulmonary embolism and unlikely this is related to  Recommendations: -*** -*** -***   {Are we signing off today?:[id number]}  For questions or updates, please contact [facility] Please consult [email or web address] for contact info under     Signed, Joellen Jersey, MD  04/06/2023 9:57 PM

## 2023-04-06 NOTE — Assessment & Plan Note (Signed)
BNP slightly elevated at 265 today, but no evidence of pulmonary edema or volume overload on imaging studies. Dont think CHF exacerbation is responsible for todays presentation either. Cards coming to see patient as well Hold home diuretics until they can evaluate and make recs.

## 2023-04-07 ENCOUNTER — Encounter (HOSPITAL_COMMUNITY): Payer: Self-pay | Admitting: Internal Medicine

## 2023-04-07 ENCOUNTER — Inpatient Hospital Stay (HOSPITAL_COMMUNITY): Payer: Medicare Other

## 2023-04-07 DIAGNOSIS — Z7983 Long term (current) use of bisphosphonates: Secondary | ICD-10-CM | POA: Diagnosis not present

## 2023-04-07 DIAGNOSIS — Z803 Family history of malignant neoplasm of breast: Secondary | ICD-10-CM | POA: Diagnosis not present

## 2023-04-07 DIAGNOSIS — I63233 Cerebral infarction due to unspecified occlusion or stenosis of bilateral carotid arteries: Secondary | ICD-10-CM | POA: Diagnosis not present

## 2023-04-07 DIAGNOSIS — J439 Emphysema, unspecified: Secondary | ICD-10-CM | POA: Diagnosis not present

## 2023-04-07 DIAGNOSIS — I700 Atherosclerosis of aorta: Secondary | ICD-10-CM | POA: Diagnosis present

## 2023-04-07 DIAGNOSIS — Z8049 Family history of malignant neoplasm of other genital organs: Secondary | ICD-10-CM | POA: Diagnosis not present

## 2023-04-07 DIAGNOSIS — I5A Non-ischemic myocardial injury (non-traumatic): Secondary | ICD-10-CM | POA: Diagnosis not present

## 2023-04-07 DIAGNOSIS — Z1152 Encounter for screening for COVID-19: Secondary | ICD-10-CM | POA: Diagnosis not present

## 2023-04-07 DIAGNOSIS — I214 Non-ST elevation (NSTEMI) myocardial infarction: Secondary | ICD-10-CM

## 2023-04-07 DIAGNOSIS — R7989 Other specified abnormal findings of blood chemistry: Secondary | ICD-10-CM | POA: Diagnosis not present

## 2023-04-07 DIAGNOSIS — I110 Hypertensive heart disease with heart failure: Secondary | ICD-10-CM | POA: Diagnosis not present

## 2023-04-07 DIAGNOSIS — R0902 Hypoxemia: Secondary | ICD-10-CM | POA: Diagnosis present

## 2023-04-07 DIAGNOSIS — G629 Polyneuropathy, unspecified: Secondary | ICD-10-CM | POA: Diagnosis not present

## 2023-04-07 DIAGNOSIS — Z7982 Long term (current) use of aspirin: Secondary | ICD-10-CM | POA: Diagnosis not present

## 2023-04-07 DIAGNOSIS — Z800 Family history of malignant neoplasm of digestive organs: Secondary | ICD-10-CM | POA: Diagnosis not present

## 2023-04-07 DIAGNOSIS — F17210 Nicotine dependence, cigarettes, uncomplicated: Secondary | ICD-10-CM | POA: Diagnosis not present

## 2023-04-07 DIAGNOSIS — I5032 Chronic diastolic (congestive) heart failure: Secondary | ICD-10-CM | POA: Diagnosis not present

## 2023-04-07 DIAGNOSIS — Z79899 Other long term (current) drug therapy: Secondary | ICD-10-CM | POA: Diagnosis not present

## 2023-04-07 DIAGNOSIS — M810 Age-related osteoporosis without current pathological fracture: Secondary | ICD-10-CM | POA: Diagnosis present

## 2023-04-07 DIAGNOSIS — C329 Malignant neoplasm of larynx, unspecified: Secondary | ICD-10-CM | POA: Diagnosis not present

## 2023-04-07 DIAGNOSIS — I4891 Unspecified atrial fibrillation: Secondary | ICD-10-CM | POA: Diagnosis not present

## 2023-04-07 DIAGNOSIS — J441 Chronic obstructive pulmonary disease with (acute) exacerbation: Secondary | ICD-10-CM | POA: Diagnosis not present

## 2023-04-07 DIAGNOSIS — E785 Hyperlipidemia, unspecified: Secondary | ICD-10-CM | POA: Diagnosis not present

## 2023-04-07 DIAGNOSIS — I739 Peripheral vascular disease, unspecified: Secondary | ICD-10-CM | POA: Diagnosis present

## 2023-04-07 DIAGNOSIS — I2510 Atherosclerotic heart disease of native coronary artery without angina pectoris: Secondary | ICD-10-CM | POA: Diagnosis not present

## 2023-04-07 DIAGNOSIS — E042 Nontoxic multinodular goiter: Secondary | ICD-10-CM | POA: Diagnosis not present

## 2023-04-07 DIAGNOSIS — I2489 Other forms of acute ischemic heart disease: Secondary | ICD-10-CM | POA: Diagnosis not present

## 2023-04-07 DIAGNOSIS — Z8249 Family history of ischemic heart disease and other diseases of the circulatory system: Secondary | ICD-10-CM | POA: Diagnosis not present

## 2023-04-07 DIAGNOSIS — I358 Other nonrheumatic aortic valve disorders: Secondary | ICD-10-CM | POA: Diagnosis not present

## 2023-04-07 DIAGNOSIS — Z801 Family history of malignant neoplasm of trachea, bronchus and lung: Secondary | ICD-10-CM | POA: Diagnosis not present

## 2023-04-07 DIAGNOSIS — J351 Hypertrophy of tonsils: Secondary | ICD-10-CM | POA: Diagnosis not present

## 2023-04-07 LAB — MAGNESIUM: Magnesium: 1.8 mg/dL (ref 1.7–2.4)

## 2023-04-07 LAB — TROPONIN I (HIGH SENSITIVITY)
Troponin I (High Sensitivity): 190 ng/L (ref <18)
Troponin I (High Sensitivity): 196 ng/L (ref <18)

## 2023-04-07 LAB — ECHOCARDIOGRAM COMPLETE
AR max vel: 2.44 cm2
AV Area VTI: 3 cm2
AV Area mean vel: 2.48 cm2
AV Mean grad: 3 mmHg
AV Peak grad: 5.5 mmHg
Ao pk vel: 1.17 m/s
Area-P 1/2: 3.61 cm2
MV VTI: 2.6 cm2
S' Lateral: 2.8 cm

## 2023-04-07 LAB — RENAL FUNCTION PANEL
Albumin: 2.8 g/dL — ABNORMAL LOW (ref 3.5–5.0)
Anion gap: 7 (ref 5–15)
BUN: 12 mg/dL (ref 8–23)
CO2: 27 mmol/L (ref 22–32)
Calcium: 8.4 mg/dL — ABNORMAL LOW (ref 8.9–10.3)
Chloride: 97 mmol/L — ABNORMAL LOW (ref 98–111)
Creatinine, Ser: 0.68 mg/dL (ref 0.44–1.00)
GFR, Estimated: >60 mL/min (ref >60)
Glucose, Bld: 92 mg/dL (ref 70–99)
Phosphorus: 3 mg/dL (ref 2.5–4.6)
Potassium: 4.2 mmol/L (ref 3.5–5.1)
Sodium: 131 mmol/L — ABNORMAL LOW (ref 135–145)

## 2023-04-07 LAB — HEPARIN LEVEL (UNFRACTIONATED)
Heparin Unfractionated: <0.1 IU/mL — ABNORMAL LOW (ref 0.30–0.70)
Heparin Unfractionated: 0.17 IU/mL — ABNORMAL LOW (ref 0.30–0.70)

## 2023-04-07 LAB — PROCALCITONIN: Procalcitonin: <0.1 ng/mL

## 2023-04-07 MED ORDER — UMECLIDINIUM BROMIDE 62.5 MCG/ACT IN AEPB
1.0000 | INHALATION_SPRAY | Freq: Every day | RESPIRATORY_TRACT | Status: DC
  Filled 2023-04-07: qty 7

## 2023-04-07 MED ORDER — FUROSEMIDE 10 MG/ML IJ SOLN
40.0000 mg | Freq: Two times a day (BID) | INTRAMUSCULAR | Status: AC
  Administered 2023-04-07 – 2023-04-08 (×2): 40 mg via INTRAVENOUS
  Filled 2023-04-07 (×2): qty 4

## 2023-04-07 MED ORDER — METHYLPREDNISOLONE SODIUM SUCC 40 MG IJ SOLR
40.0000 mg | Freq: Two times a day (BID) | INTRAMUSCULAR | Status: DC
  Administered 2023-04-07 – 2023-04-11 (×8): 40 mg via INTRAVENOUS
  Filled 2023-04-07 (×9): qty 1

## 2023-04-07 MED ORDER — POTASSIUM CHLORIDE 20 MEQ PO PACK
40.0000 meq | PACK | ORAL | Status: AC
  Administered 2023-04-07 (×2): 40 meq via ORAL
  Filled 2023-04-07 (×2): qty 2

## 2023-04-07 MED ORDER — IPRATROPIUM-ALBUTEROL 0.5-2.5 (3) MG/3ML IN SOLN
3.0000 mL | Freq: Four times a day (QID) | RESPIRATORY_TRACT | Status: DC
  Administered 2023-04-08: 3 mL via RESPIRATORY_TRACT
  Filled 2023-04-07: qty 3

## 2023-04-07 MED ORDER — BUDESONIDE 0.25 MG/2ML IN SUSP
0.2500 mg | Freq: Two times a day (BID) | RESPIRATORY_TRACT | Status: DC
  Administered 2023-04-08 – 2023-04-11 (×7): 0.25 mg via RESPIRATORY_TRACT
  Filled 2023-04-07 (×7): qty 2

## 2023-04-07 MED ORDER — PERFLUTREN LIPID MICROSPHERE
1.0000 mL | INTRAVENOUS | Status: AC | PRN
  Administered 2023-04-07: 2 mL via INTRAVENOUS

## 2023-04-07 MED ORDER — HEPARIN BOLUS VIA INFUSION
1000.0000 [IU] | Freq: Once | INTRAVENOUS | Status: AC
  Administered 2023-04-07: 1000 [IU] via INTRAVENOUS
  Filled 2023-04-07: qty 1000

## 2023-04-07 MED ORDER — PANTOPRAZOLE SODIUM 40 MG PO TBEC
40.0000 mg | DELAYED_RELEASE_TABLET | Freq: Every day | ORAL | Status: DC
  Administered 2023-04-07 – 2023-04-11 (×5): 40 mg via ORAL
  Filled 2023-04-07 (×5): qty 1

## 2023-04-07 MED ORDER — HEPARIN (PORCINE) 25000 UT/250ML-% IV SOLN
1050.0000 [IU]/h | INTRAVENOUS | Status: DC
  Administered 2023-04-07: 1050 [IU]/h via INTRAVENOUS
  Filled 2023-04-07: qty 250

## 2023-04-07 NOTE — ED Notes (Signed)
Cardiologist at [facility]

## 2023-04-07 NOTE — Progress Notes (Signed)
ANTICOAGULATION CONSULT NOTE - Initial Consult  Pharmacy Consult for Heparin Indication: chest pain/ACS  Allergies  Allergen Reactions   Bee Venom Anaphylaxis and Other (See Comments)    Welts, also    Insect bites- Welts, also   Insect Extract Anaphylaxis and Other (See Comments)    Insect bites- Welts, also   Farxiga [Dapagliflozin] Nausea And Vomiting   Lyrica [Pregabalin] Nausea And Vomiting   Neurontin [Gabapentin] Nausea And Vomiting and Other (See Comments)    Hypertension/dizziness   Nsaids Nausea Only and Other (See Comments)    Patient is not suppose to have this class of medication (liver)  Can tolerate 81mg , which she is taking   Latex Rash and Other (See Comments)    Patient Measurements:   Heparin Dosing Weight: 58.2 kg  Vital Signs: Temp: 98.8 F (37.1 C) (07/21 0715) Temp Source: Oral (07/21 0715) BP: 158/101 (07/21 0800) Pulse Rate: 85 (07/21 0800)  Labs: Recent Labs    04/06/23 1755 04/06/23 1910 04/06/23 2316 04/07/23 0125 04/07/23 0810  HGB 12.9  --   --   --   --   HCT 38.3  --   --   --   --   PLT 244  --   --   --   --   HEPARINUNFRC  --   --   --   --  <0.10*  CREATININE 0.68  --   --   --   --   TROPONINIHS 62* 112* 190* 196*  --     Estimated Creatinine Clearance: 57.5 mL/min (by C-G formula based on SCr of 0.68 mg/dL).   Medical History: Past Medical History:  Diagnosis Date   AAA (abdominal aortic aneurysm) (HCC)    3.4 cm   Anemia    Anxiety    Aortic atherosclerosis (HCC)    Arthritis    Asthma    CAD (coronary artery disease)    a. Nonobstructive CAD by cath 07/17/13.   Cancer [facility])    Carotid artery disease (HCC)    Carotid US 8/22: Bilateral ICA 1-39; right subclavian stenosis; right thyroid nodule   CHD (congenital heart disease)    pt unaware???   Chest pain    a. Adm 10-07/2013 - CTA neg for PE/dissection, cath with nonobstructive disease, CP ?due to uncontrolled HTN vs coronary vasospasm.   CHF (congestive  heart failure) (HCC)    Closed head injury with concussion    COPD (chronic obstructive pulmonary disease) (HCC)    Coronary artery disease    Echocardiogram    Echocardiogram 06/2019: EF 60-65, impaired relaxation (Gr 1 DD), mild MAC, mild MR, mild to mod aortic valve sclerosis (no AS), trivial TR, mild LAE, normal RVSF, RVSP 30.7 (mildly elevated)   GERD (gastroesophageal reflux disease)    History of blood transfusion    childbirth, Hysterectomy   Hyperlipidemia    Hypertension    Neuropathy    OA (osteoarthritis)    Osteoporosis    PAD (peripheral artery disease) (HCC)    a. s/p L fem-pop bypass b. recent evaluation at [facility], unamenable to intervention   PAD (peripheral artery disease) (HCC)    Palpitations 02/22/2016   Pneumonia    Pre-diabetes    Premature ventricular contraction    PUD (peptic ulcer disease)    PVD (peripheral vascular disease) (HCC) 07/17/2013   Thoracic aortic aneurysm (HCC)    Chest/aorta CTA 04/2022: 14 x 8 focal saccular aneurysm versus penetrating atherosclerotic ulcer involving left side of proximal  portion of transverse aortic arch; descending thoracic aorta and proximal abdominal aorta aneurysm 4.4 cm; aortic atherosclerosis; emphysema   Tobacco abuse    Wears dentures    Wears glasses     Medications:  (Not in a hospital admission)  Scheduled:   aspirin EC  81 mg Oral Daily   atorvastatin  80 mg Oral QHS   clopidogrel  75 mg Oral QHS   Tiotropium Bromide Monohydrate  2 puff Oral Daily   Infusions:   heparin 700 Units/hr (04/06/23 2212)   PRN: acetaminophen, albuterol, albuterol, nitroGLYCERIN, ondansetron (ZOFRAN) IV, oxyCODONE  Assessment: 29 yof with a history of HF, PAD s/p bypass, CAD, laryngeal cancer, COPD, AAA, HTN, HLD, tobacco use. Patient is presenting with SOB. Heparin per pharmacy consult placed for chest pain/ACS.  CTA PE negative for PE - stable dilatation of descending thoracic aorta noted - "overall morphology of aorta is  stable"  Patient is not on anticoagulation prior to arrival.  Heparin level undetectable while on 700u/heparin. No pauses of issues with infusion per RN. HgB 12.9 and PLTS 244.   Goal of Therapy:  Heparin level 0.3-0.7 units/ml Monitor platelets by anticoagulation protocol: Yes   Plan:  Increase heparin to 900u/hr.  Check anti-Xa level in 8 hours and daily while on heparin Continue to monitor H&H and platelets F/u cath plans, likely Monday per cardiology notes.   Estill Batten, PharmD, BCCCP  04/07/2023 9:20 AM

## 2023-04-07 NOTE — Progress Notes (Signed)
ANTICOAGULATION CONSULT NOTE - Follow Up Consult  Pharmacy Consult for Heparin Indication: chest pain/ACS  Allergies  Allergen Reactions   Bee Venom Anaphylaxis and Other (See Comments)    Welts, also    Insect bites- Welts, also   Insect Extract Anaphylaxis and Other (See Comments)    Insect bites- Welts, also   Farxiga [Dapagliflozin] Nausea And Vomiting   Lyrica [Pregabalin] Nausea And Vomiting   Neurontin [Gabapentin] Nausea And Vomiting and Other (See Comments)    Hypertension/dizziness   Nsaids Nausea Only and Other (See Comments)    Patient is not suppose to have this class of medication (liver)  Can tolerate 81mg , which she is taking   Latex Rash and Other (See Comments)    Patient Measurements:   Heparin Dosing Weight: 58.2 kg  Vital Signs: Temp: 98.8 F (37.1 C) (07/21 1529) Temp Source: Oral (07/21 1529) BP: 150/86 (07/21 1529) Pulse Rate: 99 (07/21 1529)  Labs: Recent Labs    04/06/23 1755 04/06/23 1910 04/06/23 2316 04/07/23 0125 04/07/23 0810 04/07/23 1226 04/07/23 1836  HGB 12.9  --   --   --   --   --   --   HCT 38.3  --   --   --   --   --   --   PLT 244  --   --   --   --   --   --   HEPARINUNFRC  --   --   --   --  <0.10*  --  0.17*  CREATININE 0.68  --   --   --   --  0.68  --   TROPONINIHS 62* 112* 190* 196*  --   --   --     Estimated Creatinine Clearance: 57.5 mL/min (by C-G formula based on SCr of 0.68 mg/dL).   Medical History: Past Medical History:  Diagnosis Date   AAA (abdominal aortic aneurysm) (HCC)    3.4 cm   Anemia    Anxiety    Aortic atherosclerosis (HCC)    Arthritis    Asthma    CAD (coronary artery disease)    a. Nonobstructive CAD by cath 07/17/13.   Cancer [facility]    Carotid artery disease (HCC)    Carotid US 8/22: Bilateral ICA 1-39; right subclavian stenosis; right thyroid nodule   CHD (congenital heart disease)    pt unaware???   Chest pain    a. Adm 10-07/2013 - CTA neg for PE/dissection, cath with  nonobstructive disease, CP ?due to uncontrolled HTN vs coronary vasospasm.   CHF (congestive heart failure) (HCC)    Closed head injury with concussion    COPD (chronic obstructive pulmonary disease) (HCC)    Coronary artery disease    Echocardiogram    Echocardiogram 06/2019: EF 60-65, impaired relaxation (Gr 1 DD), mild MAC, mild MR, mild to mod aortic valve sclerosis (no AS), trivial TR, mild LAE, normal RVSF, RVSP 30.7 (mildly elevated)   GERD (gastroesophageal reflux disease)    History of blood transfusion    childbirth, Hysterectomy   Hyperlipidemia    Hypertension    Neuropathy    OA (osteoarthritis)    Osteoporosis    PAD (peripheral artery disease) (HCC)    a. s/p L fem-pop bypass b. recent evaluation at [facility], unamenable to intervention   PAD (peripheral artery disease) (HCC)    Palpitations 02/22/2016   Pneumonia    Pre-diabetes    Premature ventricular contraction    PUD (peptic ulcer  disease)    PVD (peripheral vascular disease) (HCC) 07/17/2013   Thoracic aortic aneurysm (HCC)    Chest/aorta CTA 04/2022: 14 x 8 focal saccular aneurysm versus penetrating atherosclerotic ulcer involving left side of proximal portion of transverse aortic arch; descending thoracic aorta and proximal abdominal aorta aneurysm 4.4 cm; aortic atherosclerosis; emphysema   Tobacco abuse    Wears dentures    Wears glasses     Medications:  Medications Prior to Admission  Medication Sig Dispense Refill Last Dose   albuterol (PROVENTIL HFA;VENTOLIN HFA) 108 (90 BASE) MCG/ACT inhaler Inhale 2 puffs into the lungs every 6 (six) hours as needed for wheezing or shortness of breath.   04/06/2023   albuterol (PROVENTIL) (2.5 MG/3ML) 0.083% nebulizer solution Take 2.5 mg by nebulization every 6 (six) hours as needed for wheezing or shortness of breath.   UNKNOWN   alendronate (FOSAMAX) 70 MG tablet Take 70 mg by mouth every Wednesday.   04/03/2023 at am   allopurinol (ZYLOPRIM) 300 MG tablet Take 300 mg  by mouth every evening.   04/05/2023 at pm   aspirin EC 81 MG tablet Take 81 mg by mouth in the morning.   04/06/2023 at am   atorvastatin (LIPITOR) 80 MG tablet Take 80 mg by mouth at bedtime.   04/05/2023 at pm   calcium carbonate (ANTACID) 500 MG chewable tablet Chew 1 tablet by mouth 2 (two) times daily as needed for heartburn or indigestion.   Past Week   Cholecalciferol (VITAMIN D) 125 MCG (5000 UT) CAPS Take 5,000 Units by mouth in the morning.   04/06/2023 at am   clopidogrel (PLAVIX) 75 MG tablet Take 1 tablet (75 mg total) by mouth daily. (Patient taking differently: Take 75 mg by mouth at bedtime.) 30 tablet 11 04/05/2023 at pm   cyanocobalamin (VITAMIN B12) 1000 MCG tablet Take 1,000 mcg by mouth daily.   04/06/2023 at am   ezetimibe (ZETIA) 10 MG tablet Take 1 tablet (10 mg total) by mouth daily. (Patient taking differently: Take 10 mg by mouth at bedtime.) 90 tablet 2 04/05/2023 at pm   furosemide (LASIX) 40 MG tablet Take 1 tablet (40 mg total) by mouth daily. 90 tablet 3 04/06/2023 at am   lactulose (CHRONULAC) 10 GM/15ML solution Take 20 g by mouth 2 (two) times daily as needed for moderate constipation.   04/04/2023   melatonin 5 MG TABS Take 5 mg by mouth at bedtime.   04/05/2023 at pm   Menthol-Methyl Salicylate (MUSCLE RUB) 10-15 % CREA Apply 1 application topically daily as needed (for arthritis pain).   Past Week   nitroGLYCERIN (NITROSTAT) 0.4 MG SL tablet Place 1 tablet (0.4 mg total) under the tongue every 5 (five) minutes x 3 doses as needed for chest pain. 25 tablet 4 UNKNOWN   Oxycodone HCl 10 MG TABS Take 1 tablet (10 mg total) by mouth 4 (four) times daily as needed (for pain). 20 tablet 0 04/06/2023 at am   pantoprazole (PROTONIX) 40 MG tablet Take 40 mg by mouth 2 (two) times daily.   04/06/2023 at am   potassium chloride SA (KLOR-CON M20) 20 MEQ tablet Take 1 tablet (20 mEq total) by mouth daily. 90 tablet 1 04/06/2023 at am   senna-docusate (SENOKOT-S) 8.6-50 MG tablet Take 2  tablets by mouth at bedtime.   04/05/2023 at pm   SPIRIVA RESPIMAT 2.5 MCG/ACT AERS Take 2 puffs by mouth daily.   04/06/2023 at am   spironolactone (ALDACTONE) 25 MG tablet Take  1 tablet (25 mg total) by mouth daily. 90 tablet 3 04/06/2023 at am   verapamil (CALAN-SR) 240 MG CR tablet Take 1 tablet (240 mg total) by mouth daily. 90 tablet 2 04/06/2023 at am   EPIPEN 2-PAK 0.3 MG/0.3ML SOAJ injection Inject 0.3 mg into the muscle daily as needed for anaphylaxis. Use as directed as needed for allergies. (Patient not taking: Reported on 04/06/2023)   Not Taking   Scheduled:   aspirin EC  81 mg Oral Daily   atorvastatin  80 mg Oral QHS   clopidogrel  75 mg Oral QHS   pantoprazole  40 mg Oral Daily   [START ON 04/08/2023] umeclidinium bromide  1 puff Inhalation Daily   Infusions:   heparin 900 Units/hr (04/07/23 0935)   PRN: acetaminophen, albuterol, albuterol, nitroGLYCERIN, ondansetron (ZOFRAN) IV, oxyCODONE  Assessment: 88 yof with a history of HF, PAD s/p bypass, CAD, laryngeal cancer, COPD, AAA, HTN, HLD, tobacco use. Patient is presenting with SOB. Heparin per pharmacy consult placed for chest pain/ACS.  CTA PE negative for PE - stable dilatation of descending thoracic aorta noted - "overall morphology of aorta is stable"  Patient is not on anticoagulation prior to arrival.  Heparin level 0.17 (subtherapeutic) on 900units/hr heparin. No pauses of issues with infusion or bleeding issues reported.   Goal of Therapy:  Heparin level 0.3-0.7 units/ml Monitor platelets by anticoagulation protocol: Yes   Plan:  Heparin 1000 units IV bolus Increase heparin to 1050 units/hr.  Check anti-Xa level in 8 hours and daily while on heparin Continue to monitor H&H and platelets F/u cath plans, likely Monday per cardiology notes.   Loralee Pacas, PharmD, BCPS 04/07/2023 7:08 PM  Please check AMION for all [facility] phone numbers After 10:00 PM, call Main Pharmacy [phone]

## 2023-04-07 NOTE — Progress Notes (Signed)
PROGRESS NOTE    Marie Johnson  VHQ:[id number] DOB: 17-Oct-1950 DOA: 04/06/2023 PCP: Ailene Ravel, MD  Outpatient Specialists:     Brief Narrative:  Patient is a 72 year old female past medical history significant for COPD, greater than 53 pack years, tobacco abuse, continuous, diastolic congestive heart failure, peripheral artery disease s/p bypass, nonocclusive coronary artery disease and recent diagnosis of laryngeal cancer.  Patient presented with 3-day history of difficulty breathing and left-sided chest pain, without any radiation.  Workup done on presentation revealed troponin of 196 today, cardiac BNP of 365.  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  Patient is on treatment for NSTEMI and COPD with exacerbation.  Cardiology input is appreciated.  Patient is on heparin drip.  Left heart catheterization is planned for tomorrow.  Echocardiogram revealed: 1. Small region of apical hypokinesis/akinesis. Basal inferior  hypokinesis. . Left ventricular ejection fraction, by estimation, is 55 to  60%. The left ventricle has normal function.   2. Right ventricular systolic function is normal. The right ventricular  size is normal.   3. Left atrial size was mildly dilated.   4. The mitral valve is normal in structure. Mild mitral valve  regurgitation.   5. The aortic valve is tricuspid. Aortic valve regurgitation is not  visualized. Aortic valve sclerosis is present, with no evidence of aortic  valve stenosis.   04/07/2023: Patient seen alongside patient's nurse.  COPD symptoms are improving.  No chest pain endorsed.  No other constitutional symptoms endorsed.   Assessment & Plan:   Principal Problem:   NSTEMI (non-ST  elevated myocardial infarction) (HCC) Active Problems:   CAD (coronary artery disease)   Hypertension   COPD (chronic obstructive pulmonary disease) (HCC)   Chronic diastolic CHF (congestive heart failure) (HCC)   Laryngeal cancer (HCC)    NSTEMI (non-ST elevated myocardial infarction) (HCC) Suspicious for NSTEMI with SOB being anginal equivalent.  Pt does have chest pressure when she gets up and moves.  Symptoms (including SOB and chest pressure) seem to have improved significantly after SL NTG x1 (when neb treatments and other interventions havent helped much), this further raises suspicion for a cardiac / coronary event. Trop has risen since presentation from 15 to 112 CTA chest is neg for PE No contrast in aorta, but aorta appears grossly unchanged from prior exams per radiologist. NSTEMI pathway Tele monitor Cards coming to evaluate: Recd heparin gtt which has been started SL NTG PRN for the moment Giving 5mg  IV metoprolol x1 now for persistent tachycardia to 120s-130s with BP 160/100 currently. Cont lipitor Got ASA in ED, cont ASA 81 daily Cont at bedtime plavix which she takes for PAD NPO after MN 04/07/2023: Cardiology input is appreciated.  For cardiac catheterization in the morning.  Patient remains on heparin drip.   Laryngeal cancer [facility]) Recent diagnosis on biopsy by ENT. EDP d/w Dr. 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 Dr. Jenne Pane notes that the mass was very small on scope He isnt concerned that mass could be occluding airway at this time, doubts that this is cause of her presentation today.   COPD (chronic obstructive pulmonary disease) (HCC) Pt with known COPD and ongoing smoking.  However: no wheezing on todays presentation, and current room O2 sat of 88%, while  low, does appear c/w her baseline as documented by cardiologist office note x2 days ago. COPD exacerbation is possible but seems less likely than cardiac cause of presentation  today. Cont home scheduled and PRN nebs 04/07/2023: Start patient on IV Solu-Medrol, nebs DuoNeb and Pulmicort.   Chronic diastolic CHF (congestive heart failure) (HCC) BNP slightly elevated at 265 today, but no evidence of pulmonary edema or volume overload on imaging studies. Dont think CHF exacerbation is responsible for todays presentation either. Cards coming to see patient as well Hold home diuretics until they can evaluate and make recs. 04/07/2023: IV Lasix 40 Mg every 12 x 2 doses.   Hypertension Got SL NTG x1 in ED Getting metoprolol 5mg  IV X1 now as well Will hold home verapamil until cardiology can assess and make recs.    DVT prophylaxis: Heparin drip Code Status: Full code. Family Communication:  Disposition Plan: Home eventually   Consultants:  Cardiology  Procedures:  Left heart catheterization tomorrow. Echocardiogram findings as documented above.  Antimicrobials:  None.   Subjective: Shortness of breath is improving.  Objective: Vitals:   04/07/23 1000 04/07/23 1019 04/07/23 1030 04/07/23 1111  BP: (!) 164/72  138/73   Pulse:   (!) 102   Resp: (!) 21  19   Temp:  98.3 F (36.8 C)    TempSrc:  Axillary    SpO2: 94%  96% 98%   No intake or output data in the 24 hours ending 04/07/23 1137 There were no vitals filed for this visit.  Examination: General exam: Patient is chronically ill looking.  Patient is thin. Respiratory system: Decreased air entry globally.   Cardiovascular system: S1 & S2 heard. Gastrointestinal system: Abdomen is soft and nontender. Central nervous system: Awake and alert.  Patient moves all extremities.. Extremities: Edema bilateral lower extremities.       Data Reviewed: I have personally reviewed following labs and imaging studies  CBC: Recent Labs  Lab 04/06/23 1755  WBC 10.5  HGB 12.9  HCT 38.3  MCV 97.2  PLT 244   Basic Metabolic Panel: Recent Labs  Lab 04/06/23 1755  NA 137  K 3.1*  CL 100  CO2 26   GLUCOSE 100*  BUN 10  CREATININE 0.68  CALCIUM 8.6*   GFR: Estimated Creatinine Clearance: 57.5 mL/min (by C-G formula based on SCr of 0.68 mg/dL). Liver Function Tests: Recent Labs  Lab 04/06/23 1755  AST 23  ALT 20  ALKPHOS 47  BILITOT 0.7  PROT 6.4*  ALBUMIN 3.1*   No results for input(s): "LIPASE", "AMYLASE" in the last 168 hours. No results for input(s): "AMMONIA" in the last 168 hours. Coagulation Profile: No results for input(s): "INR", "PROTIME" in the last 168 hours. Cardiac Enzymes: No results for input(s): "CKTOTAL", "CKMB", "CKMBINDEX", "TROPONINI" in the last 168 hours. BNP (last 3 results) No results for input(s): "PROBNP" in the last 8760 hours. HbA1C: No results for input(s): "HGBA1C" in the last 72 hours. CBG: No results for input(s): "GLUCAP" in the last 168 hours. Lipid Profile: No results for input(s): "CHOL", "HDL", "LDLCALC", "TRIG", "CHOLHDL", "LDLDIRECT" in the last 72 hours. Thyroid Function Tests: No results for input(s): "TSH", "T4TOTAL", "FREET4", "T3FREE", "THYROIDAB" in the last 72 hours. Anemia Panel: No results for input(s): "VITAMINB12", "FOLATE", "FERRITIN", "TIBC", "IRON", "RETICCTPCT" in the last 72 hours. Urine analysis:    Component Value Date/Time   COLORURINE STRAW (A) 03/15/2020 1007   APPEARANCEUR CLEAR 03/15/2020 1007   LABSPEC 1.005 03/15/2020 1007   PHURINE  6.0 03/15/2020 1007   GLUCOSEU NEGATIVE 03/15/2020 1007   HGBUR SMALL (A) 03/15/2020 1007   BILIRUBINUR NEGATIVE 03/15/2020 1007   KETONESUR NEGATIVE 03/15/2020 1007   PROTEINUR NEGATIVE 03/15/2020 1007   NITRITE NEGATIVE 03/15/2020 1007   LEUKOCYTESUR NEGATIVE 03/15/2020 1007   Sepsis Labs: @LABRCNTIP (procalcitonin:4,lacticidven:4)  ) Recent Results (from the past 240 hour(s))  Resp panel by RT-PCR (RSV, Flu A&B, Covid) Anterior Nasal Swab     Status: None   Collection Time: 04/06/23  5:55 PM   Specimen: Anterior Nasal Swab  Result Value Ref Range Status    SARS Coronavirus 2 by RT PCR NEGATIVE NEGATIVE Final   Influenza A by PCR NEGATIVE NEGATIVE Final   Influenza B by PCR NEGATIVE NEGATIVE Final    Comment: (NOTE) The Xpert Xpress SARS-CoV-2/FLU/RSV plus assay is intended as an aid in the diagnosis of influenza from Nasopharyngeal swab specimens and should not be used as a sole basis for treatment. Nasal washings and aspirates are unacceptable for Xpert Xpress SARS-CoV-2/FLU/RSV testing.  Fact Sheet for Patients: BloggerCourse.com  Fact Sheet for Healthcare Providers: SeriousBroker.it  This test is not yet approved or cleared by the [place] FDA and has been authorized for detection and/or diagnosis of SARS-CoV-2 by FDA under an Emergency Use Authorization (EUA). This EUA will remain in effect (meaning this test can be used) for the duration of the COVID-19 declaration under Section 564(b)(1) of the Act, 21 U.S.C. section 360bbb-3(b)(1), unless the authorization is terminated or revoked.     Resp Syncytial Virus by PCR NEGATIVE NEGATIVE Final    Comment: (NOTE) Fact Sheet for Patients: BloggerCourse.com  Fact Sheet for Healthcare Providers: SeriousBroker.it  This test is not yet approved or cleared by the [place] FDA and has been authorized for detection and/or diagnosis of SARS-CoV-2 by FDA under an Emergency Use Authorization (EUA). This EUA will remain in effect (meaning this test can be used) for the duration of the COVID-19 declaration under Section 564(b)(1) of the Act, 21 U.S.C. section 360bbb-3(b)(1), unless the authorization is terminated or revoked.  Performed at [facility], [address] [place], [place] [address]          Radiology Studies: CT Angio Chest PE W and/or Wo Contrast  Result Date: 04/06/2023 CLINICAL DATA:  Positive D-dimer and shortness of breath, initial encounter EXAM: CT  ANGIOGRAPHY CHEST WITH CONTRAST TECHNIQUE: Multidetector CT imaging of the chest was performed using the standard protocol during bolus administration of intravenous contrast. Multiplanar CT image reconstructions and MIPs were obtained to evaluate the vascular anatomy. RADIATION DOSE REDUCTION: This exam was performed according to the departmental dose-optimization program which includes automated exposure control, adjustment of the mA and/or kV according to patient size and/or use of iterative reconstruction technique. CONTRAST:  75mL OMNIPAQUE IOHEXOL 350 MG/ML SOLN COMPARISON:  Chest x-ray from earlier in the same day, CT from 04/20/2022 FINDINGS: Cardiovascular: Atherosclerotic calcifications of the thoracic aorta are noted. No ascending aneurysmal dilatation is seen. No significant aortic opacification is noted to suggest dissection. Descending aorta demonstrates stable dilatation at the level of the aortic hiatus measuring up to 4.5 cm. This is relatively stable from the prior exam. Heart is mildly enlarged in size. The pulmonary artery shows a normal branching pattern bilaterally. No filling defect to suggest pulmonary embolism is noted. Scattered coronary calcifications are noted. 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 Mild contour deformity  is noted suggesting stability. Mediastinum/Nodes: Thoracic inlet is within normal limits. No sizable hilar or mediastinal adenopathy is noted. Calcified lymph nodes are noted within the hila and mediastinum consistent with prior granulomatous disease. Lungs/Pleura: Lungs are well aerated bilaterally. Scattered calcified granulomas are seen. Mild emphysematous change is noted. Mild right basilar atelectasis is noted. Upper Abdomen: Visualized upper abdomen shows calcified splenic granulomas. No other focal abnormality is seen. Vascular calcifications are  noted. Musculoskeletal: No acute rib abnormality is noted. Degenerative changes of the thoracic spine are seen. No compression deformity is noted. Review of the MIP images confirms the above findings. IMPRESSION: No evidence of pulmonary emboli. 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 The overall morphology of the aorta is stable however. Changes consistent with prior granulomatous disease. Aortic Atherosclerosis (ICD10-I70.0) and Emphysema (ICD10-J43.9). Electronically Signed   By: Alcide Clever M.D.   On: 04/06/2023 20:40   DG Chest Port 1 View  Result Date: 04/06/2023 CLINICAL DATA:  Shortness of breath EXAM: PORTABLE CHEST 1 VIEW COMPARISON:  Chest x-ray 03/18/2019 FINDINGS: There some atelectasis in the lung bases. There is a small left pleural effusion. There is no pneumothorax or acute fracture. The heart is mildly enlarged, unchanged. IMPRESSION: 1. Small left pleural effusion. 2. Bibasilar atelectasis. Electronically Signed   By: Darliss Cheney M.D.   On: 04/06/2023 17:27        Scheduled Meds:  aspirin EC  81 mg Oral Daily   atorvastatin  80 mg Oral QHS   clopidogrel  75 mg Oral QHS   [START ON 04/08/2023] umeclidinium bromide  1 puff Inhalation Daily   Continuous Infusions:  heparin 900 Units/hr (04/07/23 0935)     LOS: 0 days    Time spent: 55 minutes.    Berton Mount, MD  [facility] Pager #: [phone] 7PM-7AM contact night coverage as above

## 2023-04-07 NOTE — Plan of Care (Signed)

## 2023-04-07 NOTE — ED Notes (Signed)
ED TO INPATIENT HANDOFF REPORT  ED Nurse Name and Phone #: [phone] Denton Ar RN  S Name/Age/Gender Marie Johnson 72 y.o. female Room/Bed: 037C/037C  Code Status   Code Status: Full Code  Home/SNF/Other Home Patient oriented to: self, place, time, and situation Is this baseline? Yes   Triage Complete: Triage complete  Chief Complaint NSTEMI (non-ST elevated myocardial infarction) (HCC) [I21.4]  Triage Note No notes on file   Allergies Allergies  Allergen Reactions   Bee Venom Anaphylaxis and Other (See Comments)    Welts, also    Insect bites- Welts, also   Insect Extract Anaphylaxis and Other (See Comments)    Insect bites- Welts, also   Farxiga [Dapagliflozin] Nausea And Vomiting   Lyrica [Pregabalin] Nausea And Vomiting   Neurontin [Gabapentin] Nausea And Vomiting and Other (See Comments)    Hypertension/dizziness   Nsaids Nausea Only and Other (See Comments)    Patient is not suppose to have this class of medication (liver)  Can tolerate 81mg , which she is taking   Latex Rash and Other (See Comments)    Level of Care/Admitting Diagnosis ED Disposition     ED Disposition  Admit   Condition  --   Comment  Hospital Area: [facility] [[id number]]  Level of Care: Telemetry Cardiac [[id number]]  May admit patient to [facility] or [name] if equivalent level of care is available:: No  Covid Evaluation: Asymptomatic - no recent exposure (last 10 days) testing not required  Diagnosis: NSTEMI (non-ST elevated myocardial infarction) [facility]) [[id number]]  Admitting Physician: Barnetta Chapel [[id number]]  Attending Physician: Berton Mount I [[id number]]  Certification:: I certify this patient will need inpatient services for at least 2 midnights  Estimated Length of Stay: 2          B Medical/Surgery History Past Medical History:  Diagnosis Date   AAA (abdominal aortic aneurysm) (HCC)    3.4 cm   Anemia    Anxiety    Aortic atherosclerosis  (HCC)    Arthritis    Asthma    CAD (coronary artery disease)    a. Nonobstructive CAD by cath 07/17/13.   Cancer [facility])    Carotid artery disease (HCC)    Carotid US 8/22: Bilateral ICA 1-39; right subclavian stenosis; right thyroid nodule   CHD (congenital heart disease)    pt unaware???   Chest pain    a. Adm 10-07/2013 - CTA neg for PE/dissection, cath with nonobstructive disease, CP ?due to uncontrolled HTN vs coronary vasospasm.   CHF (congestive heart failure) (HCC)    Closed head injury with concussion    COPD (chronic obstructive pulmonary disease) (HCC)    Coronary artery disease    Echocardiogram    Echocardiogram 06/2019: EF 60-65, impaired relaxation (Gr 1 DD), mild MAC, mild MR, mild to mod aortic valve sclerosis (no AS), trivial TR, mild LAE, normal RVSF, RVSP 30.7 (mildly elevated)   GERD (gastroesophageal reflux disease)    History of blood transfusion    childbirth, Hysterectomy   Hyperlipidemia    Hypertension    Neuropathy    OA (osteoarthritis)    Osteoporosis    PAD (peripheral artery disease) (HCC)    a. s/p L fem-pop bypass b. recent evaluation at [facility], unamenable to intervention   PAD (peripheral artery disease) (HCC)    Palpitations 02/22/2016   Pneumonia    Pre-diabetes    Premature ventricular contraction    PUD (peptic ulcer disease)    PVD (  peripheral vascular disease) (HCC) 07/17/2013   Thoracic aortic aneurysm (HCC)    Chest/aorta CTA 04/2022: 14 x 8 focal saccular aneurysm versus penetrating atherosclerotic ulcer involving left side of proximal portion of transverse aortic arch; descending thoracic aorta and proximal abdominal aorta aneurysm 4.4 cm; aortic atherosclerosis; emphysema   Tobacco abuse    Wears dentures    Wears glasses    Past Surgical History:  Procedure Laterality Date   ABDOMINAL AORTOGRAM W/LOWER EXTREMITY N/A 03/09/2020   Procedure: ABDOMINAL AORTOGRAM W/LOWER EXTREMITY;  Surgeon: Cephus Shelling, MD;  Location: [facility]  INVASIVE CV LAB;  Service: Cardiovascular;  Laterality: N/A;   ABDOMINAL AORTOGRAM W/LOWER EXTREMITY N/A 07/26/2022   Procedure: ABDOMINAL AORTOGRAM W/LOWER EXTREMITY;  Surgeon: Cephus Shelling, MD;  Location: [facility] INVASIVE CV LAB;  Service: Cardiovascular;  Laterality: N/A;   ABDOMINAL AORTOGRAM W/LOWER EXTREMITY N/A 09/20/2022   Procedure: ABDOMINAL AORTOGRAM W/LOWER EXTREMITY;  Surgeon: Cephus Shelling, MD;  Location: [facility] INVASIVE CV LAB;  Service: Cardiovascular;  Laterality: N/A;   ABDOMINAL HYSTERECTOMY     BREAST SURGERY     left cyst excision x 2   CARDIAC CATHETERIZATION     several years ago, nonobstructive, "50-60% blockages"   DIRECT LARYNGOSCOPY Bilateral 03/13/2023   Procedure: MICRO DIRECT LARYNGOSCOPY WITH BIOPSY;  Surgeon: Christia Reading, MD;  Location: [facility] OR;  Service: ENT;  Laterality: Bilateral;   ENDARTERECTOMY FEMORAL Right 03/18/2020   Procedure: ENDARTERECTOMY FEMORAL WITH PROFUNDAPLASTY;  Surgeon: Cephus Shelling, MD;  Location: [facility] OR;  Service: Vascular;  Laterality: Right;   FEMORAL-POPLITEAL BYPASS GRAFT Right 03/18/2020   Procedure: BYPASS GRAFT FEMORAL-POPLITEAL ARTERY using GORE PROPATEN VASCULAR GRAFT;  Surgeon: Cephus Shelling, MD;  Location: [facility] OR;  Service: Vascular;  Laterality: Right;   HIP SURGERY     left - bars and screws placed   HIP SURGERY     INSERTION OF ILIAC STENT Right 03/18/2020   Procedure: INSERTION OF GORE VIABAHN  ILIAC STENT;  Surgeon: Cephus Shelling, MD;  Location: [facility] OR;  Service: Vascular;  Laterality: Right;   LEFT HEART CATHETERIZATION WITH CORONARY ANGIOGRAM N/A 07/17/2013   Procedure: LEFT HEART CATHETERIZATION WITH CORONARY ANGIOGRAM;  Surgeon: Laurey Morale, MD;  Location: [facility] CATH LAB;  Service: Cardiovascular;  Laterality: N/A;   LOWER EXTREMITY ANGIOGRAM Right 03/18/2020   Procedure: RIGHT ILIAC ARTERIOGRAM WITH AN ILIAC STENT;  Surgeon: Cephus Shelling, MD;  Location: [facility] OR;  Service: Vascular;  Laterality:  Right;   MULTIPLE TOOTH EXTRACTIONS     PATCH ANGIOPLASTY Right 03/18/2020   Procedure: PATCH ANGIOPLASTY using a Livia Snellen BIOLOGIC PATCH OF THE PROFUNDA;  Surgeon: Cephus Shelling, MD;  Location: [facility] OR;  Service: Vascular;  Laterality: Right;   PERIPHERAL VASCULAR CATHETERIZATION N/A 07/16/2016   Procedure: Abdominal Aortogram w/Lower Extremity;  Surgeon: Larina Earthly, MD;  Location: [facility] INVASIVE CV LAB;  Service: Cardiovascular;  Laterality: N/A;   PERIPHERAL VASCULAR INTERVENTION Left 03/09/2020   Procedure: PERIPHERAL VASCULAR INTERVENTION;  Surgeon: Cephus Shelling, MD;  Location: [facility] INVASIVE CV LAB;  Service: Cardiovascular;  Laterality: Left;  common and external illiac    ROTATOR CUFF REPAIR     right side, had torn ligaments as well   TUBAL LIGATION     VEIN BYPASS SURGERY     left leg   WRIST ARTHROCENTESIS     left - broke in 3 places so has bars and screws placed     A IV Location/Drains/Wounds Patient Lines/Drains/Airways Status  Active Line/Drains/Airways     Name Placement date Placement time Site Days   Peripheral IV 04/06/23 18 G Anterior;Right Forearm 04/06/23  1754  Forearm  1   Peripheral IV 04/07/23 20 G Anterior;Left Forearm 04/07/23  0009  Forearm  less than 1            Intake/Output Last 24 hours No intake or output data in the 24 hours ending 04/07/23 1303  Labs/Imaging Results for orders placed or performed during the hospital encounter of 04/06/23 (from the past 48 hour(s))  Resp panel by RT-PCR (RSV, Flu A&B, Covid) Anterior Nasal Swab     Status: None   Collection Time: 04/06/23  5:55 PM   Specimen: Anterior Nasal Swab  Result Value Ref Range   SARS Coronavirus 2 by RT PCR NEGATIVE NEGATIVE   Influenza A by PCR NEGATIVE NEGATIVE   Influenza B by PCR NEGATIVE NEGATIVE    Comment: (NOTE) The Xpert Xpress SARS-CoV-2/FLU/RSV plus assay is intended as an aid in the diagnosis of influenza from Nasopharyngeal swab specimens and should  not be used as a sole basis for treatment. Nasal washings and aspirates are unacceptable for Xpert Xpress SARS-CoV-2/FLU/RSV testing.  Fact Sheet for Patients: BloggerCourse.com  Fact Sheet for Healthcare Providers: SeriousBroker.it  This test is not yet approved or cleared by the [place] FDA and has been authorized for detection and/or diagnosis of SARS-CoV-2 by FDA under an Emergency Use Authorization (EUA). This EUA will remain in effect (meaning this test can be used) for the duration of the COVID-19 declaration under Section 564(b)(1) of the Act, 21 U.S.C. section 360bbb-3(b)(1), unless the authorization is terminated or revoked.     Resp Syncytial Virus by PCR NEGATIVE NEGATIVE    Comment: (NOTE) Fact Sheet for Patients: BloggerCourse.com  Fact Sheet for Healthcare Providers: SeriousBroker.it  This test is not yet approved or cleared by the [place] FDA and has been authorized for detection and/or diagnosis of SARS-CoV-2 by FDA under an Emergency Use Authorization (EUA). This EUA will remain in effect (meaning this test can be used) for the duration of the COVID-19 declaration under Section 564(b)(1) of the Act, 21 U.S.C. section 360bbb-3(b)(1), unless the authorization is terminated or revoked.  Performed at [facility], [address] [place], [place] [address]   Comprehensive metabolic panel     Status: Abnormal   Collection Time: 04/06/23  5:55 PM  Result Value Ref Range   Sodium 137 135 - 145 mmol/L   Potassium 3.1 (L) 3.5 - 5.1 mmol/L   Chloride 100 98 - 111 mmol/L   CO2 26 22 - 32 mmol/L   Glucose, Bld 100 (H) 70 - 99 mg/dL    Comment: Glucose reference range applies only to samples taken after fasting for at least 8 hours.   BUN 10 8 - 23 mg/dL   Creatinine, Ser 1.91 0.44 - 1.00 mg/dL   Calcium 8.6 (L) 8.9 - 10.3 mg/dL   Total Protein 6.4  (L) 6.5 - 8.1 g/dL   Albumin 3.1 (L) 3.5 - 5.0 g/dL   AST 23 15 - 41 U/L   ALT 20 0 - 44 U/L   Alkaline Phosphatase 47 38 - 126 U/L   Total Bilirubin 0.7 0.3 - 1.2 mg/dL   GFR, Estimated >47 >82 mL/min    Comment: (NOTE) Calculated using the CKD-EPI Creatinine Equation (2021)    Anion gap 11 5 - 15    Comment: Performed at [facility], [address] [place], [place] [address]  CBC     Status: Abnormal   Collection Time: 04/06/23  5:55 PM  Result Value Ref Range   WBC 10.5 4.0 - 10.5 K/uL   RBC 3.94 3.87 - 5.11 MIL/uL   Hemoglobin 12.9 12.0 - 15.0 g/dL   HCT 60.4 54.0 - 98.1 %   MCV 97.2 80.0 - 100.0 fL   MCH 32.7 26.0 - 34.0 pg   MCHC 33.7 30.0 - 36.0 g/dL   RDW 19.1 (H) 47.8 - 29.5 %   Platelets 244 150 - 400 K/uL   nRBC 0.0 0.0 - 0.2 %    Comment: Performed at [facility], [address], [place], [place] [address]  Troponin I (High Sensitivity)     Status: Abnormal   Collection Time: 04/06/23  5:55 PM  Result Value Ref Range   Troponin I (High Sensitivity) 62 (H) <18 ng/L    Comment: (NOTE) Elevated high sensitivity troponin I (hsTnI) values and significant  changes across serial measurements may suggest ACS but many other  chronic and acute conditions are known to elevate hsTnI results.  Refer to the "Links" section for chest pain algorithms and additional  guidance. Performed at [facility], [address], [place], [place] [address]   D-dimer, quantitative     Status: Abnormal   Collection Time: 04/06/23  5:55 PM  Result Value Ref Range   D-Dimer, Quant 1.22 (H) 0.00 - 0.50 ug/mL-FEU    Comment: (NOTE) At the manufacturer cut-off value of 0.5 g/mL FEU, this assay has a negative predictive value of 95-100%.This assay is intended for use in conjunction with a clinical pretest probability (PTP) assessment model to exclude pulmonary embolism (PE) and deep venous thrombosis (DVT) in outpatients suspected of PE or DVT. Results should be  correlated with clinical presentation. Performed at [facility], [address], [place], [place] [address]   Brain natriuretic peptide     Status: Abnormal   Collection Time: 04/06/23  5:56 PM  Result Value Ref Range   B Natriuretic Peptide 265.9 (H) 0.0 - 100.0 pg/mL    Comment: Performed at [facility], [address], [place], [place] [address]  Troponin I (High Sensitivity)     Status: Abnormal   Collection Time: 04/06/23  7:10 PM  Result Value Ref Range   Troponin I (High Sensitivity) 112 (HH) <18 ng/L    Comment: CRITICAL RESULT CALLED TO, READ BACK BY AND VERIFIED WITH S,DAVIS RN @1954  04/06/23 E,BENTON (NOTE) Elevated high sensitivity troponin I (hsTnI) values and significant  changes across serial measurements may suggest ACS but many other  chronic and acute conditions are known to elevate hsTnI results.  Refer to the "Links" section for chest pain algorithms and additional  guidance. Performed at [facility], [address] [place], [place] [address]   Lactic acid, plasma     Status: None   Collection Time: 04/06/23 11:14 PM  Result Value Ref Range   Lactic Acid, Venous 1.0 0.5 - 1.9 mmol/L    Comment: Performed at [facility], [address], [place], [place] [address]  Procalcitonin     Status: None   Collection Time: 04/06/23 11:16 PM  Result Value Ref Range   Procalcitonin <0.10 ng/mL    Comment:        Interpretation: PCT (Procalcitonin) <= 0.5 ng/mL: Systemic infection (sepsis) is not likely. Local bacterial infection is possible. (NOTE)  Sepsis PCT Algorithm           Lower Respiratory Tract                                      Infection PCT Algorithm    ----------------------------     ----------------------------         PCT < 0.25 ng/mL                PCT < 0.10 ng/mL          Strongly encourage             Strongly discourage   discontinuation of antibiotics    initiation of antibiotics     ----------------------------     -----------------------------       PCT 0.25 - 0.50 ng/mL            PCT 0.10 - 0.25 ng/mL               OR       >80% decrease in PCT            Discourage initiation of                                            antibiotics      Encourage discontinuation           of antibiotics    ----------------------------     -----------------------------         PCT >= 0.50 ng/mL              PCT 0.26 - 0.50 ng/mL               AND        <80% decrease in PCT             Encourage initiation of                                             antibiotics       Encourage continuation           of antibiotics    ----------------------------     -----------------------------        PCT >= 0.50 ng/mL                  PCT > 0.50 ng/mL               AND         increase in PCT                  Strongly encourage                                      initiation of antibiotics    Strongly encourage escalation           of antibiotics                                     -----------------------------  PCT <= 0.25 ng/mL                                                 OR                                        > 80% decrease in PCT                                      Discontinue / Do not initiate                                             antibiotics  Performed at [facility], [address] [place], [place] [address]   Troponin I (High Sensitivity)     Status: Abnormal   Collection Time: 04/06/23 11:16 PM  Result Value Ref Range   Troponin I (High Sensitivity) 190 (HH) <18 ng/L    Comment: CRITICAL VALUE NOTED. VALUE IS CONSISTENT WITH PREVIOUSLY REPORTED/CALLED VALUE (NOTE) Elevated high sensitivity troponin I (hsTnI) values and significant  changes across serial measurements may suggest ACS but many other  chronic and acute conditions are known to elevate hsTnI results.  Refer to the "Links" section for chest pain  algorithms and additional  guidance. Performed at [facility], [address] [place], [place] [address]   Troponin I (High Sensitivity)     Status: Abnormal   Collection Time: 04/07/23  1:25 AM  Result Value Ref Range   Troponin I (High Sensitivity) 196 (HH) <18 ng/L    Comment: CRITICAL VALUE NOTED. VALUE IS CONSISTENT WITH PREVIOUSLY REPORTED/CALLED VALUE (NOTE) Elevated high sensitivity troponin I (hsTnI) values and significant  changes across serial measurements may suggest ACS but many other  chronic and acute conditions are known to elevate hsTnI results.  Refer to the "Links" section for chest pain algorithms and additional  guidance. Performed at [facility], [address] [place], [place] [address]   Heparin level (unfractionated)     Status: Abnormal   Collection Time: 04/07/23  8:10 AM  Result Value Ref Range   Heparin Unfractionated <0.10 (L) 0.30 - 0.70 IU/mL    Comment: (NOTE) The clinical reportable range upper limit is being lowered to >1.10 to align with the FDA approved guidance for the current laboratory assay.  If heparin results are below expected values, and patient dosage has  been confirmed, suggest follow up testing of antithrombin III levels. Performed at [facility], [address] [place], [place] [address]    CT Angio Chest PE W and/or Wo Contrast  Result Date: 04/06/2023 CLINICAL DATA:  Positive D-dimer and shortness of breath, initial encounter EXAM: CT ANGIOGRAPHY CHEST WITH CONTRAST TECHNIQUE: Multidetector CT imaging of the chest was performed using the standard protocol during bolus administration of intravenous contrast. Multiplanar CT image reconstructions and MIPs were obtained to evaluate the vascular anatomy. RADIATION DOSE REDUCTION: This exam was performed according to the departmental dose-optimization program which includes automated exposure control, adjustment of the mA and/or kV  according to patient size  and/or use of iterative reconstruction technique. CONTRAST:  75mL OMNIPAQUE IOHEXOL 350 MG/ML SOLN COMPARISON:  Chest x-ray from earlier in the same day, CT from 04/20/2022 FINDINGS: Cardiovascular: Atherosclerotic calcifications of the thoracic aorta are noted. No ascending aneurysmal dilatation is seen. No significant aortic opacification is noted to suggest dissection. Descending aorta demonstrates stable dilatation at the level of the aortic hiatus measuring up to 4.5 cm. This is relatively stable from the prior exam. Heart is mildly enlarged in size. The pulmonary artery shows a normal branching pattern bilaterally. No filling defect to suggest pulmonary embolism is noted. Scattered coronary calcifications are noted. 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 Mild contour deformity is noted suggesting stability. Mediastinum/Nodes: Thoracic inlet is within normal limits. No sizable hilar or mediastinal adenopathy is noted. Calcified lymph nodes are noted within the hila and mediastinum consistent with prior granulomatous disease. Lungs/Pleura: Lungs are well aerated bilaterally. Scattered calcified granulomas are seen. Mild emphysematous change is noted. Mild right basilar atelectasis is noted. Upper Abdomen: Visualized upper abdomen shows calcified splenic granulomas. No other focal abnormality is seen. Vascular calcifications are noted. Musculoskeletal: No acute rib abnormality is noted. Degenerative changes of the thoracic spine are seen. No compression deformity is noted. Review of the MIP images confirms the above findings. IMPRESSION: No evidence of pulmonary emboli. 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 The overall morphology of the aorta is stable however.  Changes consistent with prior granulomatous disease. Aortic Atherosclerosis (ICD10-I70.0) and Emphysema (ICD10-J43.9). Electronically Signed   By: Alcide Clever M.D.   On: 04/06/2023 20:40   DG Chest Port 1 View  Result Date: 04/06/2023 CLINICAL DATA:  Shortness of breath EXAM: PORTABLE CHEST 1 VIEW COMPARISON:  Chest x-ray 03/18/2019 FINDINGS: There some atelectasis in the lung bases. There is a small left pleural effusion. There is no pneumothorax or acute fracture. The heart is mildly enlarged, unchanged. IMPRESSION: 1. Small left pleural effusion. 2. Bibasilar atelectasis. Electronically Signed   By: Darliss Cheney M.D.   On: 04/06/2023 17:27    Pending Labs Unresulted Labs (From admission, onward)     Start     Ordered   04/08/23 0500  Heparin level (unfractionated)  Daily,   R      04/06/23 2138   04/07/23 1800  Heparin level (unfractionated)  Once-Timed,   TIMED        04/07/23 0923   04/07/23 1226  Renal function panel  ONCE - STAT,   STAT        04/07/23 1226   04/07/23 1226  Magnesium  ONCE - STAT,   STAT        04/07/23 1226   04/07/23 0200  Lipoprotein A (LPA)  Once,   R        04/07/23 0200   04/06/23 2255  Urinalysis, Complete w Microscopic -Urine, Clean Catch  Once,   R       Question:  Specimen Source  Answer:  Urine, Clean Catch   04/06/23 2254            Vitals/Pain Today's Vitals   04/07/23 1019 04/07/23 1030 04/07/23 1111 04/07/23 1115  BP:  138/73    Pulse:  (!) 102    Resp:  19    Temp: 98.3 F (36.8  C)     TempSrc: Axillary     SpO2:  96% 98%   PainSc:    8     Isolation Precautions No active isolations  Medications Medications  heparin ADULT infusion 100 units/mL (25000 units/264mL) (900 Units/hr Intravenous Rate/Dose Change 04/07/23 0935)  aspirin EC tablet 81 mg (81 mg Oral Given 04/07/23 0935)  nitroGLYCERIN (NITROSTAT) SL tablet 0.4 mg (0.4 mg Sublingual Given 04/06/23 2312)  ondansetron (ZOFRAN) injection 4 mg (has no administration in time  range)  atorvastatin (LIPITOR) tablet 80 mg (80 mg Oral Given 04/07/23 0030)  acetaminophen (TYLENOL) tablet 650 mg (has no administration in time range)  oxyCODONE (Oxy IR/ROXICODONE) immediate release tablet 10 mg (10 mg Oral Given 04/07/23 0818)  clopidogrel (PLAVIX) tablet 75 mg (75 mg Oral Given 04/07/23 0030)  albuterol (PROVENTIL) (2.5 MG/3ML) 0.083% nebulizer solution 2.5 mg (has no administration in time range)  albuterol (PROVENTIL) (2.5 MG/3ML) 0.083% nebulizer solution 3 mL (has no administration in time range)  umeclidinium bromide (INCRUSE ELLIPTA) 62.5 MCG/ACT 1 puff (has no administration in time range)  pantoprazole (PROTONIX) EC tablet 40 mg (has no administration in time range)  potassium chloride (KLOR-CON) packet 40 mEq (has no administration in time range)  ipratropium-albuterol (DUONEB) 0.5-2.5 (3) MG/3ML nebulizer solution 3 mL (3 mLs Nebulization Given 04/06/23 1827)  iohexol (OMNIPAQUE) 350 MG/ML injection 75 mL (75 mLs Intravenous Contrast Given 04/06/23 2026)  potassium chloride SA (KLOR-CON M) CR tablet 40 mEq (40 mEq Oral Given 04/06/23 2206)  aspirin chewable tablet 324 mg (324 mg Oral Given 04/06/23 2206)  heparin bolus via infusion 3,000 Units (3,000 Units Intravenous Bolus from Bag 04/06/23 2213)  metoprolol tartrate (LOPRESSOR) injection 5 mg (5 mg Intravenous Given 04/07/23 0010)    Mobility walks     Focused Assessments Cardiac Assessment Handoff:  Cardiac Rhythm: Normal sinus rhythm Lab Results  Component Value Date   CKTOTAL 207 03/08/2020   TROPONINI <0.30 07/17/2013   Lab Results  Component Value Date   DDIMER 1.22 (H) 04/06/2023   Does the Patient currently have chest pain? No   , Pulmonary Assessment Handoff:  Lung sounds: Bilateral Breath Sounds: Diminished L Breath Sounds: Diminished R Breath Sounds: Diminished O2 Device: Nasal Cannula O2 Flow Rate (L/min): 2 L/min    R Recommendations: See Admitting Provider Note  Report given to:    Additional Notes: Pt A&Ox4, not on O2 at baseline. Currently on 2L of O2. Very pleasant pt. No CP and never had CP. Last trop was 196.

## 2023-04-07 NOTE — ED Notes (Signed)
Called 2C to notify of pt being brought to inpatient floor.

## 2023-04-07 NOTE — Progress Notes (Signed)
Rounding Note    Patient Name: Marie Johnson Date of Encounter: 04/07/2023  [facility] Cardiologist: Meriam Sprague, MD   Subjective   Pt witn minimal chest pressure   Comes and goes Breating is fair  Inpatient Medications    Scheduled Meds:  aspirin EC  81 mg Oral Daily   atorvastatin  80 mg Oral QHS   clopidogrel  75 mg Oral QHS   Tiotropium Bromide Monohydrate  2 puff Oral Daily   Continuous Infusions:  heparin 700 Units/hr (04/06/23 2212)   PRN Meds: acetaminophen, albuterol, albuterol, nitroGLYCERIN, ondansetron (ZOFRAN) IV, oxyCODONE   Vital Signs    Vitals:   04/07/23 0315 04/07/23 0400 04/07/23 0430 04/07/23 0715  BP:  129/69 (!) 142/80 (!) 172/81  Pulse:  85 86 (!) 105  Resp:  (!) 21 (!) 21 19  Temp: 99.2 F (37.3 C)   98.8 F (37.1 C)  TempSrc: Oral   Oral  SpO2:  94% 96% 95%   No intake or output data in the 24 hours ending 04/07/23 0826    04/04/2023    8:14 AM 03/13/2023    1:14 PM 03/12/2023   12:50 PM  Last 3 Weights  Weight (lbs) 128 lb 6.4 oz 125 lb 124 lb  Weight (kg) 58.242 kg 56.7 kg 56.246 kg      Telemetry    SR  - Personally Reviewed  ECG    No new  - Personally Reviewed  Physical Exam   GEN: No acute distress.   Neck: JVP is normal  Cardiac: RRR, I/VI systolic murmur Respiratory: Decreased airflow   Wheezes  . GI: Soft, nontender, non-distended  MS: No edema; No deformity.    Labs    High Sensitivity Troponin:   Recent Labs  Lab 04/06/23 1755 04/06/23 1910 04/06/23 2316 04/07/23 0125  TROPONINIHS 62* 112* 190* 196*     Chemistry Recent Labs  Lab 04/06/23 1755  NA 137  K 3.1*  CL 100  CO2 26  GLUCOSE 100*  BUN 10  CREATININE 0.68  CALCIUM 8.6*  PROT 6.4*  ALBUMIN 3.1*  AST 23  ALT 20  ALKPHOS 47  BILITOT 0.7  GFRNONAA >60  ANIONGAP 11    Lipids No results for input(s): "CHOL", "TRIG", "HDL", "LABVLDL", "LDLCALC", "CHOLHDL" in the last 168 hours.  Hematology Recent Labs  Lab  04/06/23 1755  WBC 10.5  RBC 3.94  HGB 12.9  HCT 38.3  MCV 97.2  MCH 32.7  MCHC 33.7  RDW 21.2*  PLT 244   Thyroid No results for input(s): "TSH", "FREET4" in the last 168 hours.  BNP Recent Labs  Lab 04/06/23 1756  BNP 265.9*    DDimer  Recent Labs  Lab 04/06/23 1755  DDIMER 1.22*     Radiology    CT Angio Chest PE W and/or Wo Contrast  Result Date: 04/06/2023 CLINICAL DATA:  Positive D-dimer and shortness of breath, initial encounter EXAM: CT ANGIOGRAPHY CHEST WITH CONTRAST TECHNIQUE: Multidetector CT imaging of the chest was performed using the standard protocol during bolus administration of intravenous contrast. Multiplanar CT image reconstructions and MIPs were obtained to evaluate the vascular anatomy. RADIATION DOSE REDUCTION: This exam was performed according to the departmental dose-optimization program which includes automated exposure control, adjustment of the mA and/or kV according to patient size and/or use of iterative reconstruction technique. CONTRAST:  75mL OMNIPAQUE IOHEXOL 350 MG/ML SOLN COMPARISON:  Chest x-ray from earlier in the same day, CT from 04/20/2022  FINDINGS: Cardiovascular: Atherosclerotic calcifications of the thoracic aorta are noted. No ascending aneurysmal dilatation is seen. No significant aortic opacification is noted to suggest dissection. Descending aorta demonstrates stable dilatation at the level of the aortic hiatus measuring up to 4.5 cm. This is relatively stable from the prior exam. Heart is mildly enlarged in size. The pulmonary artery shows a normal branching pattern bilaterally. No filling defect to suggest pulmonary embolism is noted. Scattered coronary calcifications are noted. 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 Mild contour deformity is noted suggesting stability. Mediastinum/Nodes: Thoracic inlet is within normal  limits. No sizable hilar or mediastinal adenopathy is noted. Calcified lymph nodes are noted within the hila and mediastinum consistent with prior granulomatous disease. Lungs/Pleura: Lungs are well aerated bilaterally. Scattered calcified granulomas are seen. Mild emphysematous change is noted. Mild right basilar atelectasis is noted. Upper Abdomen: Visualized upper abdomen shows calcified splenic granulomas. No other focal abnormality is seen. Vascular calcifications are noted. Musculoskeletal: No acute rib abnormality is noted. Degenerative changes of the thoracic spine are seen. No compression deformity is noted. Review of the MIP images confirms the above findings. IMPRESSION: No evidence of pulmonary emboli. 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 The overall morphology of the aorta is stable however. Changes consistent with prior granulomatous disease. Aortic Atherosclerosis (ICD10-I70.0) and Emphysema (ICD10-J43.9). Electronically Signed   By: Alcide Clever M.D.   On: 04/06/2023 20:40   DG Chest Port 1 View  Result Date: 04/06/2023 CLINICAL DATA:  Shortness of breath EXAM: PORTABLE CHEST 1 VIEW COMPARISON:  Chest x-ray 03/18/2019 FINDINGS: There some atelectasis in the lung bases. There is a small left pleural effusion. There is no pneumothorax or acute fracture. The heart is mildly enlarged, unchanged. IMPRESSION: 1. Small left pleural effusion. 2. Bibasilar atelectasis. Electronically Signed   By: Darliss Cheney M.D.   On: 04/06/2023 17:27    Cardiac Studies   Echo ordered   Patient Profile     Marie Johnson is a 72 y.o. female with a hx of CHF, nonobstructive CAD, peripheral artery disease status post lower extremity bypass, COPD, active smoker, and laryngeal cancer who is being seen today for the evaluation of shortness of breath at the request of emergency  department hospital medicine.   Assessment & Plan    1  NSTEMI  Pt with SOB   Presented to ED     Trop 190, 196   Pt with nonobstructive CAD at cath in 2014  has CV dz  CT neg for PE    Pt with intermitt tightness   With this and trop elevation plan on LHC tomorrow Risks/benefits described  Pt understands and agrees to proceed.  Continue heparin  2  HTN  BP is labile  120s to 160s/ Will add low dose amlodipine   2.5 mg   Follow   3  Pulmonary  Significant COPD  Pt continues to smoke despite laryngeal cancer  Wheezing on exam, Volume status OK Primary team to Rx care    4  HL Keep on lipitor  5  PAD  Signficant dz hx  Continue ASA and plavix    For questions or updates, please contact [facility] Please consult [email or web address] for contact info under        Signed, Dietrich Pates, MD  04/07/2023, 8:26 AM

## 2023-04-08 ENCOUNTER — Encounter (HOSPITAL_COMMUNITY)
Admission: EM | Disposition: A | Discharge status: Home / Self Care | Payer: Self-pay | Source: Home / Self Care | Attending: Internal Medicine

## 2023-04-08 DIAGNOSIS — I2489 Other forms of acute ischemic heart disease: Secondary | ICD-10-CM | POA: Diagnosis not present

## 2023-04-08 DIAGNOSIS — I2510 Atherosclerotic heart disease of native coronary artery without angina pectoris: Secondary | ICD-10-CM | POA: Diagnosis not present

## 2023-04-08 HISTORY — PX: LEFT HEART CATH AND CORONARY ANGIOGRAPHY: CATH118249

## 2023-04-08 LAB — RENAL FUNCTION PANEL
Albumin: 3 g/dL — ABNORMAL LOW (ref 3.5–5.0)
Anion gap: 6 (ref 5–15)
BUN: 13 mg/dL (ref 8–23)
CO2: 25 mmol/L (ref 22–32)
Calcium: 8.7 mg/dL — ABNORMAL LOW (ref 8.9–10.3)
Chloride: 105 mmol/L (ref 98–111)
Creatinine, Ser: 0.76 mg/dL (ref 0.44–1.00)
GFR, Estimated: >60 mL/min (ref >60)
Glucose, Bld: 175 mg/dL — ABNORMAL HIGH (ref 70–99)
Phosphorus: 3.4 mg/dL (ref 2.5–4.6)
Potassium: 4.5 mmol/L (ref 3.5–5.1)
Sodium: 136 mmol/L (ref 135–145)

## 2023-04-08 LAB — HEPARIN LEVEL (UNFRACTIONATED): Heparin Unfractionated: 0.37 IU/mL (ref 0.30–0.70)

## 2023-04-08 LAB — CBC
HCT: 40.1 % (ref 36.0–46.0)
Hemoglobin: 13.2 g/dL (ref 12.0–15.0)
MCH: 32.4 pg (ref 26.0–34.0)
MCHC: 32.9 g/dL (ref 30.0–36.0)
MCV: 98.3 fL (ref 80.0–100.0)
Platelets: 243 10*3/uL (ref 150–400)
RBC: 4.08 MIL/uL (ref 3.87–5.11)
RDW: 21.6 % — ABNORMAL HIGH (ref 11.5–15.5)
WBC: 7 10*3/uL (ref 4.0–10.5)
nRBC: 0.3 % — ABNORMAL HIGH (ref 0.0–0.2)

## 2023-04-08 LAB — MAGNESIUM: Magnesium: 2 mg/dL (ref 1.7–2.4)

## 2023-04-08 SURGERY — LEFT HEART CATH AND CORONARY ANGIOGRAPHY
Anesthesia: LOCAL

## 2023-04-08 MED ORDER — ASPIRIN 81 MG PO CHEW: 81.0000 mg | CHEWABLE_TABLET | ORAL | Status: DC

## 2023-04-08 MED ORDER — MIDAZOLAM HCL 2 MG/2ML IJ SOLN
INTRAMUSCULAR | Status: AC
  Filled 2023-04-08: qty 2

## 2023-04-08 MED ORDER — SODIUM CHLORIDE 0.9% FLUSH
3.0000 mL | Freq: Two times a day (BID) | INTRAVENOUS | Status: DC
  Administered 2023-04-08 – 2023-04-11 (×6): 3 mL via INTRAVENOUS

## 2023-04-08 MED ORDER — FUROSEMIDE 40 MG PO TABS
40.0000 mg | ORAL_TABLET | Freq: Every day | ORAL | Status: DC
  Administered 2023-04-09 – 2023-04-11 (×3): 40 mg via ORAL
  Filled 2023-04-08 (×3): qty 1

## 2023-04-08 MED ORDER — SODIUM CHLORIDE 0.9 % IV SOLN: 250.0000 mL | INTRAVENOUS | Status: DC | PRN

## 2023-04-08 MED ORDER — HEPARIN (PORCINE) IN NACL 1000-0.9 UT/500ML-% IV SOLN
INTRAVENOUS | Status: DC | PRN
  Administered 2023-04-08 (×2): 500 mL

## 2023-04-08 MED ORDER — HEPARIN SODIUM (PORCINE) 1000 UNIT/ML IJ SOLN
INTRAMUSCULAR | Status: AC
  Filled 2023-04-08: qty 10

## 2023-04-08 MED ORDER — SODIUM CHLORIDE 0.9% FLUSH
3.0000 mL | Freq: Two times a day (BID) | INTRAVENOUS | Status: DC
  Administered 2023-04-08: 3 mL via INTRAVENOUS

## 2023-04-08 MED ORDER — VERAPAMIL HCL ER 240 MG PO TBCR
240.0000 mg | EXTENDED_RELEASE_TABLET | Freq: Every day | ORAL | Status: DC
  Administered 2023-04-08 – 2023-04-11 (×4): 240 mg via ORAL
  Filled 2023-04-08 (×4): qty 1

## 2023-04-08 MED ORDER — LIDOCAINE HCL (PF) 1 % IJ SOLN
INTRAMUSCULAR | Status: DC | PRN
  Administered 2023-04-08: 2 mL

## 2023-04-08 MED ORDER — SODIUM CHLORIDE 0.9 % IV SOLN: INTRAVENOUS | Status: AC

## 2023-04-08 MED ORDER — LABETALOL HCL 5 MG/ML IV SOLN: 10.0000 mg | INTRAVENOUS | Status: AC | PRN

## 2023-04-08 MED ORDER — HEPARIN SODIUM (PORCINE) 5000 UNIT/ML IJ SOLN
5000.0000 [IU] | Freq: Three times a day (TID) | INTRAMUSCULAR | Status: DC
  Administered 2023-04-08 – 2023-04-11 (×8): 5000 [IU] via SUBCUTANEOUS
  Filled 2023-04-08 (×8): qty 1

## 2023-04-08 MED ORDER — HYDRALAZINE HCL 20 MG/ML IJ SOLN: 10.0000 mg | INTRAMUSCULAR | Status: AC | PRN

## 2023-04-08 MED ORDER — IOHEXOL 350 MG/ML SOLN
INTRAVENOUS | Status: DC | PRN
  Administered 2023-04-08: 34 mL via INTRA_ARTERIAL

## 2023-04-08 MED ORDER — SPIRONOLACTONE 25 MG PO TABS
25.0000 mg | ORAL_TABLET | Freq: Every day | ORAL | Status: DC
  Administered 2023-04-08 – 2023-04-11 (×4): 25 mg via ORAL
  Filled 2023-04-08 (×4): qty 1

## 2023-04-08 MED ORDER — VERAPAMIL HCL 2.5 MG/ML IV SOLN
INTRAVENOUS | Status: AC
  Filled 2023-04-08: qty 2

## 2023-04-08 MED ORDER — SODIUM CHLORIDE 0.9% FLUSH: 3.0000 mL | INTRAVENOUS | Status: DC | PRN

## 2023-04-08 MED ORDER — LIDOCAINE HCL (PF) 1 % IJ SOLN
INTRAMUSCULAR | Status: AC
  Filled 2023-04-08: qty 30

## 2023-04-08 MED ORDER — VERAPAMIL HCL 2.5 MG/ML IV SOLN
INTRAVENOUS | Status: DC | PRN
  Administered 2023-04-08: 10 mL via INTRA_ARTERIAL

## 2023-04-08 MED ORDER — HEPARIN SODIUM (PORCINE) 1000 UNIT/ML IJ SOLN
INTRAMUSCULAR | Status: DC | PRN
  Administered 2023-04-08: 2500 [IU] via INTRAVENOUS

## 2023-04-08 MED ORDER — MIDAZOLAM HCL 2 MG/2ML IJ SOLN
INTRAMUSCULAR | Status: DC | PRN
  Administered 2023-04-08: 1 mg via INTRAVENOUS

## 2023-04-08 MED ORDER — FENTANYL CITRATE (PF) 100 MCG/2ML IJ SOLN
INTRAMUSCULAR | Status: DC | PRN
  Administered 2023-04-08: 25 ug via INTRAVENOUS

## 2023-04-08 MED ORDER — FENTANYL CITRATE (PF) 100 MCG/2ML IJ SOLN
INTRAMUSCULAR | Status: AC
  Filled 2023-04-08: qty 2

## 2023-04-08 MED ORDER — SODIUM CHLORIDE 0.9 % IV SOLN: INTRAVENOUS | Status: DC

## 2023-04-08 SURGICAL SUPPLY — 11 items
CATH INFINITI AMBI 5FR TG (CATHETERS) IMPLANT
GLIDESHEATH SLEND SS 6F .021 (SHEATH) IMPLANT
GUIDEWIRE INQWIRE 1.5J.035X260 (WIRE) IMPLANT
INQWIRE 1.5J .035X260CM (WIRE) ×1
KIT HEART LEFT (KITS) ×1 IMPLANT
KIT SINGLE USE MANIFOLD (KITS) IMPLANT
PACK CARDIAC CATHETERIZATION (CUSTOM PROCEDURE TRAY) ×1 IMPLANT
PROTECTION STATION PRESSURIZED (MISCELLANEOUS) ×1
SET ATX-X65L (MISCELLANEOUS) IMPLANT
SHEATH PROBE COVER 6X72 (BAG) IMPLANT
STATION PROTECTION PRESSURIZED (MISCELLANEOUS) IMPLANT

## 2023-04-08 NOTE — Brief Op Note (Signed)
   NAME: KIYO HEAL  MRN: [id number] 04/08/2023' 5:14 PM  Primary Care Physician: Ailene Ravel, MD Primary Cardiologist: Meriam Sprague, MD  Referring Cardiologist Dr. Thomasene Ripple  SURGEON:  Marykay Lex, MD - Primary  PROCEDURE:  Procedure(s): LEFT HEART CATH AND CORONARY ANGIOGRAPHY (N/A)  PATIENT:  Marie Johnson is a 72 y.o. female with a history of congestive heart failure, nonobstructive CAD, peripheral artery disease status post lower extremity bypass, COPD, active smoker presented for shortness of breath found to have elevated troponin.  She has been treated as possible non-STEMI is now referred for invasive evaluation with cardiac catheterization plus PCI.  PRE-OPERATIVE DIAGNOSIS:  NSTEMI -vs. Demand Ischemia  POST-OPERATIVE DIAGNOSIS:   Diffuse Coronary Calcification with only mild-moderate non-obstructive disease - NO Culprit Lesion to explain + Troponin -- DEMAND ISCHEMIA not NSTEMI Normal LVEDP ~11 mmHg  PROCEDURE PERFORMED Time Out: Verified patient identification, verified procedure, site/side was marked, verified correct patient position, special equipment/implants available, medications/allergies/relevent history reviewed, required imaging and test results available. Performed.  Access:  * RIGHT Radial Artery: 6 Fr sheath -- Seldinger technique using Micropuncture Kit -- Direct ultrasound guidance used.  Permanent image obtained and placed on chart. -- 10 mL radial cocktail IA; 2500 Units IV Heparin  Left Heart Catheterization: 5Fr TIG 4.0 Catheter was advanced or exchanged over a J-wire under direct fluoroscopic guidance into the ascending aorta; the catheter was advanced across the aortic valve first for left ventricular hemodynamics (no LV gram as she just had an echocardiogram).  The catheter then pulled back across aortic valve for pullback gradient and then used to engage first the Left, then Right Coronary Artery for selective  coronaryCineangiography  Review of initial angiography revealed: Mild to moderate diffuse nonobstructive disease with extensive calcification of the aorta, LAD, LCx etc.=> Troponin elevation likely not related to non-STEMI.  More related to Demand Ischemia.  Preparations are made for medical therapy  Upon completion of Angiogaphy, the catheter was removed completely out of the body over a wire, without complication.  Radial sheath removed in the Cardiac Catheterization lab with TR Band placed for hemostasis.  TR Band: 1705  Hours; 14 mL air; reverse Barbeau C  MEDICATIONS SQ Lidocaine 3 mL Radial Cocktail: 3 mg Verapmil in 10 mL NS Heparin: 2500 units   ANESTHESIA:   local and IV sedation;  MEDICATIONS USED:  LIDOCAINE 4 mL; 1 mg Versed, 25 mg fentanyl for sedation; ~50 mL contrast  EBL:  <50 mL  PATIENT DISPOSITION:  PACU - hemodynamically stable.  PLAN OF CARE:  Patient will return to nursing unit for ongoing care.  Treat underlying cause likely COPD exacerbation.  DICTATION: .Note written in EPIC    Delay start of Pharmacological VTE agent (>24hrs) due to surgical blood loss or risk of bleeding: not applicable    Bryan Lemma, MD

## 2023-04-08 NOTE — Progress Notes (Signed)
Progress Note  Patient Name: Marie Johnson Date of Encounter: 04/08/2023  Primary Cardiologist: Meriam Sprague, MD   Subjective   Patient seen and examined at her bedside. She is aware of her LHC today. Has no specific questions.   Inpatient Medications    Scheduled Meds:  aspirin EC  81 mg Oral Daily   atorvastatin  80 mg Oral QHS   budesonide (PULMICORT) nebulizer solution  0.25 mg Nebulization BID   clopidogrel  75 mg Oral QHS   furosemide  40 mg Intravenous Q12H   ipratropium-albuterol  3 mL Nebulization QID   methylPREDNISolone (SOLU-MEDROL) injection  40 mg Intravenous Q12H   pantoprazole  40 mg Oral Daily   Continuous Infusions:  heparin 1,050 Units/hr (04/08/23 0747)   PRN Meds: acetaminophen, albuterol, albuterol, nitroGLYCERIN, ondansetron (ZOFRAN) IV, oxyCODONE   Vital Signs    Vitals:   04/07/23 1900 04/07/23 2300 04/08/23 0445 04/08/23 0700  BP: (!) 152/75 132/89 (!) 159/79 (!) 130/93  Pulse: 86 80 79 96  Resp: 20 (!) 23 18 (!) 25  Temp: 99 F (37.2 C) 97.6 F (36.4 C) (!) 97.5 F (36.4 C) 97.8 F (36.6 C)  TempSrc: Oral Oral Oral Oral  SpO2: 98% 97%  98%    Intake/Output Summary (Last 24 hours) at 04/08/2023 6433 Last data filed at 04/08/2023 0747 Gross per 24 hour  Intake 128.88 ml  Output --  Net 128.88 ml   There were no vitals filed for this visit.  Telemetry    Sinus rhythm - Personally Reviewed  ECG    none - Personally Reviewed  Physical Exam   General: Comfortable Head: Atraumatic, normal size  Eyes: PEERLA, EOMI  Neck: Supple, normal JVD Cardiac: Normal S1, S2; RRR; no murmurs, rubs, or gallops Lungs: Clear to auscultation bilaterally Abd: Soft, nontender, no hepatomegaly  Ext: warm, no edema Musculoskeletal: No deformities, BUE and BLE strength normal and equal Skin: Warm and dry, no rashes   Neuro: Alert and oriented to person, place, time, and situation, CNII-XII grossly intact, no focal deficits  Psych:  Normal mood and affect   Labs    Chemistry Recent Labs  Lab 04/06/23 1755 04/07/23 1226  NA 137 131*  K 3.1* 4.2  CL 100 97*  CO2 26 27  GLUCOSE 100* 92  BUN 10 12  CREATININE 0.68 0.68  CALCIUM 8.6* 8.4*  PROT 6.4*  --   ALBUMIN 3.1* 2.8*  AST 23  --   ALT 20  --   ALKPHOS 47  --   BILITOT 0.7  --   GFRNONAA >60 >60  ANIONGAP 11 7     Hematology Recent Labs  Lab 04/06/23 1755  WBC 10.5  RBC 3.94  HGB 12.9  HCT 38.3  MCV 97.2  MCH 32.7  MCHC 33.7  RDW 21.2*  PLT 244    Cardiac EnzymesNo results for input(s): "TROPONINI" in the last 168 hours. No results for input(s): "TROPIPOC" in the last 168 hours.   BNP Recent Labs  Lab 04/06/23 1756  BNP 265.9*     DDimer  Recent Labs  Lab 04/06/23 1755  DDIMER 1.22*     Radiology    ECHOCARDIOGRAM COMPLETE  Result Date: 04/07/2023    ECHOCARDIOGRAM REPORT   Patient Name:   Marie Johnson Date of Exam: 04/07/2023 Medical Rec #:  [id number]      Height:       67.0 in Accession #:    [id number]  Weight:       128.4 lb Date of Birth:  01/26/1951      BSA:          1.675 m Patient Age:    71 years       BP:           138/73 mmHg Patient Gender: F              HR:           75 bpm. Exam Location:  Inpatient Procedure: 2D Echo, Color Doppler and Cardiac Doppler Indications:    Elevated Troponin  History:        Patient has prior history of Echocardiogram examinations, most                 recent 03/10/2020. CAD; COPD.  Sonographer:    Darlys Gales Referring Phys: [id number] JARED M GARDNER IMPRESSIONS  1. Small region of apical hypokinesis/akinesis. Basal inferior hypokinesis. . Left ventricular ejection fraction, by estimation, is 55 to 60%. The left ventricle has normal function.  2. Right ventricular systolic function is normal. The right ventricular size is normal.  3. Left atrial size was mildly dilated.  4. The mitral valve is normal in structure. Mild mitral valve regurgitation.  5. The aortic valve is tricuspid. Aortic  valve regurgitation is not visualized. Aortic valve sclerosis is present, with no evidence of aortic valve stenosis. FINDINGS  Left Ventricle: Small region of apical hypokinesis/akinesis. Basal inferior hypokinesis. Left ventricular ejection fraction, by estimation, is 55 to 60%. The left ventricle has normal function. The left ventricular internal cavity size was normal in size. There is no left ventricular hypertrophy. Right Ventricle: The right ventricular size is normal. Right vetricular wall thickness was not assessed. Right ventricular systolic function is normal. Left Atrium: Left atrial size was mildly dilated. Right Atrium: Right atrial size was normal in size. Pericardium: Trivial pericardial effusion is present. Mitral Valve: The mitral valve is normal in structure. There is mild thickening of the mitral valve leaflet(s). Mild mitral annular calcification. Mild mitral valve regurgitation. MV peak gradient, 5.3 mmHg. The mean mitral valve gradient is 3.0 mmHg. Tricuspid Valve: The tricuspid valve is normal in structure. Tricuspid valve regurgitation is trivial. Aortic Valve: The aortic valve is tricuspid. Aortic valve regurgitation is not visualized. Aortic valve sclerosis is present, with no evidence of aortic valve stenosis. Aortic valve mean gradient measures 3.0 mmHg. Aortic valve peak gradient measures 5.5  mmHg. Aortic valve area, by VTI measures 3.00 cm. Pulmonic Valve: The pulmonic valve was not well visualized. Pulmonic valve regurgitation is not visualized. Aorta: The aortic root is normal in size and structure. IAS/Shunts: No atrial level shunt detected by color flow Doppler.  LEFT VENTRICLE PLAX 2D LVIDd:         4.50 cm   Diastology LVIDs:         2.80 cm   LV e' medial:    5.33 cm/s LV PW:         0.80 cm   LV E/e' medial:  19.3 LV IVS:        0.80 cm   LV e' lateral:   4.68 cm/s LVOT diam:     1.80 cm   LV E/e' lateral: 22.0 LV SV:         67 LV SV Index:   40 LVOT Area:     2.54 cm  RIGHT  VENTRICLE RV S prime:     9.25 cm/s TAPSE (M-mode):  1.9 cm LEFT ATRIUM             Index        RIGHT ATRIUM          Index LA Vol (A2C):   53.9 ml 32.18 ml/m  RA Area:     9.48 cm LA Vol (A4C):   54.9 ml 32.77 ml/m  RA Volume:   15.90 ml 9.49 ml/m LA Biplane Vol: 56.6 ml 33.79 ml/m  AORTIC VALVE AV Area (Vmax):    2.44 cm AV Area (Vmean):   2.48 cm AV Area (VTI):     3.00 cm AV Vmax:           117.00 cm/s AV Vmean:          83.600 cm/s AV VTI:            0.223 m AV Peak Grad:      5.5 mmHg AV Mean Grad:      3.0 mmHg LVOT Vmax:         112.00 cm/s LVOT Vmean:        81.500 cm/s LVOT VTI:          0.263 m LVOT/AV VTI ratio: 1.18  AORTA Ao Root diam: 2.30 cm MITRAL VALVE MV Area (PHT): 3.61 cm     SHUNTS MV Area VTI:   2.60 cm     Systemic VTI:  0.26 m MV Peak grad:  5.3 mmHg     Systemic Diam: 1.80 cm MV Mean grad:  3.0 mmHg MV Vmax:       1.15 m/s MV Vmean:      85.5 cm/s MV Decel Time: 210 msec MV E velocity: 103.00 cm/s MV A velocity: 109.00 cm/s MV E/A ratio:  0.94 Dietrich Pates MD Electronically signed by Dietrich Pates MD Signature Date/Time: 04/07/2023/3:27:13 PM    Final    CT Angio Chest PE W and/or Wo Contrast  Result Date: 04/06/2023 CLINICAL DATA:  Positive D-dimer and shortness of breath, initial encounter EXAM: CT ANGIOGRAPHY CHEST WITH CONTRAST TECHNIQUE: Multidetector CT imaging of the chest was performed using the standard protocol during bolus administration of intravenous contrast. Multiplanar CT image reconstructions and MIPs were obtained to evaluate the vascular anatomy. RADIATION DOSE REDUCTION: This exam was performed according to the departmental dose-optimization program which includes automated exposure control, adjustment of the mA and/or kV according to patient size and/or use of iterative reconstruction technique. CONTRAST:  75mL OMNIPAQUE IOHEXOL 350 MG/ML SOLN COMPARISON:  Chest x-ray from earlier in the same day, CT from 04/20/2022 FINDINGS: Cardiovascular: Atherosclerotic  calcifications of the thoracic aorta are noted. No ascending aneurysmal dilatation is seen. No significant aortic opacification is noted to suggest dissection. Descending aorta demonstrates stable dilatation at the level of the aortic hiatus measuring up to 4.5 cm. This is relatively stable from the prior exam. Heart is mildly enlarged in size. The pulmonary artery shows a normal branching pattern bilaterally. No filling defect to suggest pulmonary embolism is noted. Scattered coronary calcifications are noted. 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 Mild contour deformity is noted suggesting stability. Mediastinum/Nodes: Thoracic inlet is within normal limits. No sizable hilar or mediastinal adenopathy is noted. Calcified lymph nodes are noted within the hila and mediastinum consistent with prior granulomatous disease. Lungs/Pleura: Lungs are well aerated bilaterally. Scattered calcified granulomas are seen. Mild emphysematous change is noted. Mild right basilar atelectasis is noted.  Upper Abdomen: Visualized upper abdomen shows calcified splenic granulomas. No other focal abnormality is seen. Vascular calcifications are noted. Musculoskeletal: No acute rib abnormality is noted. Degenerative changes of the thoracic spine are seen. No compression deformity is noted. Review of the MIP images confirms the above findings. IMPRESSION: No evidence of pulmonary emboli. 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 The overall morphology of the aorta is stable however. Changes consistent with prior granulomatous disease. Aortic Atherosclerosis (ICD10-I70.0) and Emphysema (ICD10-J43.9). Electronically Signed   By: Alcide Clever M.D.   On: 04/06/2023 20:40   DG Chest Port 1  View  Result Date: 04/06/2023 CLINICAL DATA:  Shortness of breath EXAM: PORTABLE CHEST 1 VIEW COMPARISON:  Chest x-ray 03/18/2019 FINDINGS: There some atelectasis in the lung bases. There is a small left pleural effusion. There is no pneumothorax or acute fracture. The heart is mildly enlarged, unchanged. IMPRESSION: 1. Small left pleural effusion. 2. Bibasilar atelectasis. Electronically Signed   By: Darliss Cheney M.D.   On: 04/06/2023 17:27    Cardiac Studies   TTE 04/04/2023 IMPRESSIONS     1. Small region of apical hypokinesis/akinesis. Basal inferior  hypokinesis. . Left ventricular ejection fraction, by estimation, is 55 to  60%. The left ventricle has normal function.   2. Right ventricular systolic function is normal. The right ventricular  size is normal.   3. Left atrial size was mildly dilated.   4. The mitral valve is normal in structure. Mild mitral valve  regurgitation.   5. The aortic valve is tricuspid. Aortic valve regurgitation is not  visualized. Aortic valve sclerosis is present, with no evidence of aortic  valve stenosis.   FINDINGS   Left Ventricle: Small region of apical hypokinesis/akinesis. Basal  inferior hypokinesis. Left ventricular ejection fraction, by estimation,  is 55 to 60%. The left ventricle has normal function. The left ventricular  internal cavity size was normal in  size. There is no left ventricular hypertrophy.   Right Ventricle: The right ventricular size is normal. Right vetricular  wall thickness was not assessed. Right ventricular systolic function is  normal.   Left Atrium: Left atrial size was mildly dilated.   Right Atrium: Right atrial size was normal in size.   Pericardium: Trivial pericardial effusion is present.   Mitral Valve: The mitral valve is normal in structure. There is mild  thickening of the mitral valve leaflet(s). Mild mitral annular  calcification. Mild mitral valve regurgitation. MV peak gradient, 5.3  mmHg. The  mean mitral valve gradient is 3.0 mmHg.   Tricuspid Valve: The tricuspid valve is normal in structure. Tricuspid  valve regurgitation is trivial.   Aortic Valve: The aortic valve is tricuspid. Aortic valve regurgitation is  not visualized. Aortic valve sclerosis is present, with no evidence of  aortic valve stenosis. Aortic valve mean gradient measures 3.0 mmHg.  Aortic valve peak gradient measures 5.5   mmHg. Aortic valve area, by VTI measures 3.00 cm.   Pulmonic Valve: The pulmonic valve was not well visualized. Pulmonic valve  regurgitation is not visualized.   Aorta: The aortic root is normal in size and structure.   IAS/Shunts: No atrial level shunt detected by color flow Doppler.   Patient Profile     72 y.o. female with a history of congestive heart failure, nonobstructive CAD, peripheral artery disease status post lower extremity bypass, COPD, active smoker presented for shortness of  breath found to have elevated troponin.  Assessment & Plan    NSTEMI HTN  COPD Hyperlipidemia PAD  Plan for cardiac catheterization today.  Informed Consent   Shared Decision Making/Informed Consent The risks [stroke (1 in 1000), death (1 in 1000), kidney failure [usually temporary] (1 in 500), bleeding (1 in 200), allergic reaction [possibly serious] (1 in 200)], benefits (diagnostic support and management of coronary artery disease) and alternatives of a cardiac catheterization were discussed in detail with Ms. Emanuele and she is willing to proceed.  Hypertensive this morning, blood pressure has been slightly elevated throughout the hospitalization.  May benefit from low-dose antihypertensive medication postcatheterization if still elevated.  Amlodipine 5 mg daily can be started.  COPD/tobacco use -smoking cessation advised.  Patient not amenable at this time to quit smoking.  Hyperlipidemia - continue with current statin medication.  Peripheral artery disease-continue aspirin and  Plavix.  She is also on a statin continue this.         For questions or updates, please contact [facility] Please consult [email or web address] for contact info under Cardiology/STEMI.      Signed, Shaniquia Brafford, DO  04/08/2023, 8:21 AM

## 2023-04-08 NOTE — Interval H&P Note (Signed)
History and Physical Interval Note:  04/08/2023 4:19 PM  Marie Johnson  has presented today for surgery, with the diagnosis of NSTEMI.  The various methods of treatment have been discussed with the patient and family. After consideration of risks, benefits and other options for treatment, the patient has consented to  Procedure(s): LEFT HEART CATH AND CORONARY ANGIOGRAPHY (N/A) PERCUTANEOUS CORONARY INTERVENTION   as a surgical intervention.  The patient's history has been reviewed, patient examined, no change in status, stable for surgery.  I have reviewed the patient's chart and labs.  Questions were answered to the patient's satisfaction.    Cath Lab Visit (complete for each Cath Lab visit)  Clinical Evaluation Leading to the Procedure:   ACS: Yes.    Non-ACS:    Anginal Classification: CCS IV  Anti-ischemic medical therapy: Minimal Therapy (1 class of medications)  Non-Invasive Test Results: No non-invasive testing performed  Prior CABG: No previous CABG   Bryan Lemma

## 2023-04-08 NOTE — Plan of Care (Signed)
  Problem: Education: Goal: Understanding of cardiac disease, CV risk reduction, and recovery process will improve Outcome: Progressing   Problem: Activity: Goal: Ability to tolerate increased activity will improve Outcome: Progressing   Problem: Cardiac: Goal: Ability to achieve and maintain adequate cardiovascular perfusion will improve Outcome: Progressing   Problem: Health Behavior/Discharge Planning: Goal: Ability to safely manage health-related needs after discharge will improve Outcome: Progressing   Problem: Activity: Goal: Ability to return to baseline activity level will improve Outcome: Progressing

## 2023-04-08 NOTE — Progress Notes (Signed)
PROGRESS NOTE    Marie Johnson  ZOX:[id number] DOB: [birth year] DOA: 04/06/2023 PCP: Ailene Ravel, MD  Outpatient Specialists:     Brief Narrative:  Patient is a 72 year old female past medical history significant for COPD, greater than 53 pack years, tobacco abuse, continuous, diastolic congestive heart failure, peripheral artery disease s/p bypass, nonocclusive coronary artery disease and recent diagnosis of laryngeal cancer.  Patient presented with 3-day history of difficulty breathing and left-sided chest pain, without any radiation.  Workup done on presentation revealed troponin of 196 today, cardiac BNP of 365.  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  Patient is on treatment for NSTEMI and COPD with exacerbation.  Cardiology input is appreciated.  Patient is on heparin drip.  Left heart catheterization is planned for tomorrow.  Echocardiogram revealed: 1. Small region of apical hypokinesis/akinesis. Basal inferior  hypokinesis. . Left ventricular ejection fraction, by estimation, is 55 to  60%. The left ventricle has normal function.   2. Right ventricular systolic function is normal. The right ventricular  size is normal.   3. Left atrial size was mildly dilated.   4. The mitral valve is normal in structure. Mild mitral valve  regurgitation.   5. The aortic valve is tricuspid. Aortic valve regurgitation is not  visualized. Aortic valve sclerosis is present, with no evidence of aortic  valve stenosis.   Cardiac catheterization done on 04/08/2023 revealed: Diffuse Coronary Calcification with only mild-moderate non-obstructive disease - NO Culprit Lesion to explain + Troponin -- DEMAND ISCHEMIA not NSTEMI Normal LVEDP  ~11 mmHg  04/08/2023: Patient seen alongside patient's nurse.  COPD symptoms have continued to improve.  No significant chest pain endorsed.  No fever or chills.  Patient voiced that she will stop smoking cigarettes!   Assessment & Plan:   Principal Problem:   Demand ischemia Active Problems:   CAD (coronary artery disease)   Hypertension   COPD (chronic obstructive pulmonary disease) (HCC)   Chronic diastolic CHF (congestive heart failure) (HCC)   Laryngeal cancer (HCC)   Demand ischemia:  -Cardiac catheterization ruled out NSTEMI (non-ST elevated myocardial infarction).  See above documentation.   -CTA chest is neg for PE No contrast in aorta, but aorta appears grossly unchanged from prior exams per radiologist. NSTEMI pathway Tele monitor Cards coming to evaluate: Recd heparin gtt which has been started SL NTG PRN for the moment Giving 5mg  IV metoprolol x1 now for persistent tachycardia to 120s-130s with BP 160/100 currently. Cont lipitor Got ASA in ED, cont ASA 81 daily Cont at bedtime plavix which she takes for PAD NPO after MN  04/08/2023: Optimize COPD exacerbation treatment.   Laryngeal cancer [facility]) Recent diagnosis on biopsy by ENT. EDP d/w Dr. 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 Dr. Jenne Pane notes that the mass was very small on scope He isnt concerned that mass could be occluding airway at this time, doubts that this is cause of her presentation today.   COPD (chronic obstructive pulmonary disease) (HCC) Pt with known COPD and ongoing smoking.  However: no wheezing on todays presentation, and current room O2 sat of 88%, while low, does appear c/w her baseline as documented by cardiologist office note x2 days ago. COPD exacerbation is possible but seems less likely than cardiac cause  of presentation today. Cont home scheduled and PRN nebs 04/07/2023: Start patient on IV Solu-Medrol, nebs DuoNeb and Pulmicort. 04/08/2023: Symptoms  continue to improve.  Continue above regimen.  Patient has intentions to quit cigarette smoking.  Follow-up with pulmonary team on discharge.   Chronic diastolic CHF (congestive heart failure) (HCC) BNP slightly elevated at 265 today, but no evidence of pulmonary edema or volume overload on imaging studies. Dont think CHF exacerbation is responsible for todays presentation either. Cards coming to see patient as well Hold home diuretics until they can evaluate and make recs. 04/07/2023: IV Lasix 40 Mg every 12 x 2 doses. 04/08/2023: Start patient on Lasix 40 Mg p.o. once daily from tomorrow (04/09/2023).   Hypertension -Restart Aldactone 25 Mg p.o. once daily and verapamil 240 Mg p.o. once daily. -Goal blood pressure should be less than 130/80 mmHg.  DVT prophylaxis: Patient was on heparin drip.  Heparin drip has been discontinued.  Start subcutaneous Lovenox from tomorrow. Code Status: Full code. Family Communication:  Disposition Plan: Home eventually   Consultants:  Cardiology  Procedures:  Left heart catheterization-see above documentation.. Echocardiogram findings as documented above.  Antimicrobials:  None.   Subjective: -Shortness of breath has continued to improve. - Significant chest pain reported.  Objective: Vitals:   04/08/23 1007 04/08/23 1141 04/08/23 1629 04/08/23 1725  BP:  (!) 156/99  (!) 149/81  Pulse:  78  84  Resp:  17  17  Temp:  97.6 F (36.4 C)  (!) 97.5 F (36.4 C)  TempSrc:  Oral  Oral  SpO2:   97% 96%  Weight: 55.7 kg       Intake/Output Summary (Last 24 hours) at 04/08/2023 1751 Last data filed at 04/08/2023 1510 Gross per 24 hour  Intake 618.96 ml  Output --  Net 618.96 ml   Filed Weights   04/08/23 1007  Weight: 55.7 kg    Examination: General exam: Patient is chronically ill looking.  Patient is thin. Respiratory system: Decreased air entry globally, but improving.   Cardiovascular system: S1 & S2 heard. Gastrointestinal system:  Abdomen is soft and nontender. Central nervous system: Awake and alert.  Patient moves all extremities.. Extremities: Edema bilateral lower extremities, improving.       Data Reviewed: I have personally reviewed following labs and imaging studies  CBC: Recent Labs  Lab 04/06/23 1755 04/08/23 0655  WBC 10.5 7.0  HGB 12.9 13.2  HCT 38.3 40.1  MCV 97.2 98.3  PLT 244 243   Basic Metabolic Panel: Recent Labs  Lab 04/06/23 1755 04/07/23 1226 04/08/23 0655  NA 137 131* 136  K 3.1* 4.2 4.5  CL 100 97* 105  CO2 26 27 25   GLUCOSE 100* 92 175*  BUN 10 12 13   CREATININE 0.68 0.68 0.76  CALCIUM 8.6* 8.4* 8.7*  MG  --  1.8 2.0  PHOS  --  3.0 3.4   GFR: Estimated Creatinine Clearance: 55.1 mL/min (by C-G formula based on SCr of 0.76 mg/dL). Liver Function Tests: Recent Labs  Lab 04/06/23 1755 04/07/23 1226 04/08/23 0655  AST 23  --   --   ALT 20  --   --   ALKPHOS 47  --   --   BILITOT 0.7  --   --   PROT 6.4*  --   --   ALBUMIN 3.1* 2.8* 3.0*   No results for input(s): "LIPASE", "AMYLASE" in the last 168 hours. No results for input(s): "AMMONIA" in the last 168 hours. Coagulation  Profile: No results for input(s): "INR", "PROTIME" in the last 168 hours. Cardiac Enzymes: No results for input(s): "CKTOTAL", "CKMB", "CKMBINDEX", "TROPONINI" in the last 168 hours. BNP (last 3 results) No results for input(s): "PROBNP" in the last 8760 hours. HbA1C: No results for input(s): "HGBA1C" in the last 72 hours. CBG: No results for input(s): "GLUCAP" in the last 168 hours. Lipid Profile: No results for input(s): "CHOL", "HDL", "LDLCALC", "TRIG", "CHOLHDL", "LDLDIRECT" in the last 72 hours. Thyroid Function Tests: No results for input(s): "TSH", "T4TOTAL", "FREET4", "T3FREE", "THYROIDAB" in the last 72 hours. Anemia Panel: No results for input(s): "VITAMINB12", "FOLATE", "FERRITIN", "TIBC", "IRON", "RETICCTPCT" in the last 72 hours. Urine analysis:    Component Value Date/Time    COLORURINE STRAW (A) 03/15/2020 1007   APPEARANCEUR CLEAR 03/15/2020 1007   LABSPEC 1.005 03/15/2020 1007   PHURINE 6.0 03/15/2020 1007   GLUCOSEU NEGATIVE 03/15/2020 1007   HGBUR SMALL (A) 03/15/2020 1007   BILIRUBINUR NEGATIVE 03/15/2020 1007   KETONESUR NEGATIVE 03/15/2020 1007   PROTEINUR NEGATIVE 03/15/2020 1007   NITRITE NEGATIVE 03/15/2020 1007   LEUKOCYTESUR NEGATIVE 03/15/2020 1007   Sepsis Labs: @LABRCNTIP (procalcitonin:4,lacticidven:4)  ) Recent Results (from the past 240 hour(s))  Resp panel by RT-PCR (RSV, Flu A&B, Covid) Anterior Nasal Swab     Status: None   Collection Time: 04/06/23  5:55 PM   Specimen: Anterior Nasal Swab  Result Value Ref Range Status   SARS Coronavirus 2 by RT PCR NEGATIVE NEGATIVE Final   Influenza A by PCR NEGATIVE NEGATIVE Final   Influenza B by PCR NEGATIVE NEGATIVE Final    Comment: (NOTE) The Xpert Xpress SARS-CoV-2/FLU/RSV plus assay is intended as an aid in the diagnosis of influenza from Nasopharyngeal swab specimens and should not be used as a sole basis for treatment. Nasal washings and aspirates are unacceptable for Xpert Xpress SARS-CoV-2/FLU/RSV testing.  Fact Sheet for Patients: BloggerCourse.com  Fact Sheet for Healthcare Providers: SeriousBroker.it  This test is not yet approved or cleared by the [place] FDA and has been authorized for detection and/or diagnosis of SARS-CoV-2 by FDA under an Emergency Use Authorization (EUA). This EUA will remain in effect (meaning this test can be used) for the duration of the COVID-19 declaration under Section 564(b)(1) of the Act, 21 U.S.C. section 360bbb-3(b)(1), unless the authorization is terminated or revoked.     Resp Syncytial Virus by PCR NEGATIVE NEGATIVE Final    Comment: (NOTE) Fact Sheet for Patients: BloggerCourse.com  Fact Sheet for Healthcare  Providers: SeriousBroker.it  This test is not yet approved or cleared by the [place] FDA and has been authorized for detection and/or diagnosis of SARS-CoV-2 by FDA under an Emergency Use Authorization (EUA). This EUA will remain in effect (meaning this test can be used) for the duration of the COVID-19 declaration under Section 564(b)(1) of the Act, 21 U.S.C. section 360bbb-3(b)(1), unless the authorization is terminated or revoked.  Performed at [facility], [address] [place], [place] [address]          Radiology Studies: ECHOCARDIOGRAM COMPLETE  Result Date: 04/07/2023    ECHOCARDIOGRAM REPORT   Patient Name:   Marie Johnson Date of Exam: 04/07/2023 Medical Rec #:  [id number]      Height:       67.0 in Accession #:    [id number]     Weight:       128.4 lb Date of Birth:  Apr 16, 1951      BSA:  1.675 m Patient Age:    71 years       BP:           138/73 mmHg Patient Gender: F              HR:           75 bpm. Exam Location:  Inpatient Procedure: 2D Echo, Color Doppler and Cardiac Doppler Indications:    Elevated Troponin  History:        Patient has prior history of Echocardiogram examinations, most                 recent 03/10/2020. CAD; COPD.  Sonographer:    Darlys Gales Referring Phys: [phone] JARED M GARDNER IMPRESSIONS  1. Small region of apical hypokinesis/akinesis. Basal inferior hypokinesis. . Left ventricular ejection fraction, by estimation, is 55 to 60%. The left ventricle has normal function.  2. Right ventricular systolic function is normal. The right ventricular size is normal.  3. Left atrial size was mildly dilated.  4. The mitral valve is normal in structure. Mild mitral valve regurgitation.  5. The aortic valve is tricuspid. Aortic valve regurgitation is not visualized. Aortic valve sclerosis is present, with no evidence of aortic valve stenosis. FINDINGS  Left Ventricle: Small region of apical hypokinesis/akinesis. Basal  inferior hypokinesis. Left ventricular ejection fraction, by estimation, is 55 to 60%. The left ventricle has normal function. The left ventricular internal cavity size was normal in size. There is no left ventricular hypertrophy. Right Ventricle: The right ventricular size is normal. Right vetricular wall thickness was not assessed. Right ventricular systolic function is normal. Left Atrium: Left atrial size was mildly dilated. Right Atrium: Right atrial size was normal in size. Pericardium: Trivial pericardial effusion is present. Mitral Valve: The mitral valve is normal in structure. There is mild thickening of the mitral valve leaflet(s). Mild mitral annular calcification. Mild mitral valve regurgitation. MV peak gradient, 5.3 mmHg. The mean mitral valve gradient is 3.0 mmHg. Tricuspid Valve: The tricuspid valve is normal in structure. Tricuspid valve regurgitation is trivial. Aortic Valve: The aortic valve is tricuspid. Aortic valve regurgitation is not visualized. Aortic valve sclerosis is present, with no evidence of aortic valve stenosis. Aortic valve mean gradient measures 3.0 mmHg. Aortic valve peak gradient measures 5.5  mmHg. Aortic valve area, by VTI measures 3.00 cm. Pulmonic Valve: The pulmonic valve was not well visualized. Pulmonic valve regurgitation is not visualized. Aorta: The aortic root is normal in size and structure. IAS/Shunts: No atrial level shunt detected by color flow Doppler.  LEFT VENTRICLE PLAX 2D LVIDd:         4.50 cm   Diastology LVIDs:         2.80 cm   LV e' medial:    5.33 cm/s LV PW:         0.80 cm   LV E/e' medial:  19.3 LV IVS:        0.80 cm   LV e' lateral:   4.68 cm/s LVOT diam:     1.80 cm   LV E/e' lateral: 22.0 LV SV:         67 LV SV Index:   40 LVOT Area:     2.54 cm  RIGHT VENTRICLE RV S prime:     9.25 cm/s TAPSE (M-mode): 1.9 cm LEFT ATRIUM             Index        RIGHT ATRIUM  Index LA Vol (A2C):   53.9 ml 32.18 ml/m  RA Area:     9.48 cm LA Vol  (A4C):   54.9 ml 32.77 ml/m  RA Volume:   15.90 ml 9.49 ml/m LA Biplane Vol: 56.6 ml 33.79 ml/m  AORTIC VALVE AV Area (Vmax):    2.44 cm AV Area (Vmean):   2.48 cm AV Area (VTI):     3.00 cm AV Vmax:           117.00 cm/s AV Vmean:          83.600 cm/s AV VTI:            0.223 m AV Peak Grad:      5.5 mmHg AV Mean Grad:      3.0 mmHg LVOT Vmax:         112.00 cm/s LVOT Vmean:        81.500 cm/s LVOT VTI:          0.263 m LVOT/AV VTI ratio: 1.18  AORTA Ao Root diam: 2.30 cm MITRAL VALVE MV Area (PHT): 3.61 cm     SHUNTS MV Area VTI:   2.60 cm     Systemic VTI:  0.26 m MV Peak grad:  5.3 mmHg     Systemic Diam: 1.80 cm MV Mean grad:  3.0 mmHg MV Vmax:       1.15 m/s MV Vmean:      85.5 cm/s MV Decel Time: 210 msec MV E velocity: 103.00 cm/s MV A velocity: 109.00 cm/s MV E/A ratio:  0.94 Dietrich Pates MD Electronically signed by Dietrich Pates MD Signature Date/Time: 04/07/2023/3:27:13 PM    Final    CT Angio Chest PE W and/or Wo Contrast  Result Date: 04/06/2023 CLINICAL DATA:  Positive D-dimer and shortness of breath, initial encounter EXAM: CT ANGIOGRAPHY CHEST WITH CONTRAST TECHNIQUE: Multidetector CT imaging of the chest was performed using the standard protocol during bolus administration of intravenous contrast. Multiplanar CT image reconstructions and MIPs were obtained to evaluate the vascular anatomy. RADIATION DOSE REDUCTION: This exam was performed according to the departmental dose-optimization program which includes automated exposure control, adjustment of the mA and/or kV according to patient size and/or use of iterative reconstruction technique. CONTRAST:  75mL OMNIPAQUE IOHEXOL 350 MG/ML SOLN COMPARISON:  Chest x-ray from earlier in the same day, CT from 04/20/2022 FINDINGS: Cardiovascular: Atherosclerotic calcifications of the thoracic aorta are noted. No ascending aneurysmal dilatation is seen. No significant aortic opacification is noted to suggest dissection. Descending aorta demonstrates  stable dilatation at the level of the aortic hiatus measuring up to 4.5 cm. This is relatively stable from the prior exam. Heart is mildly enlarged in size. The pulmonary artery shows a normal branching pattern bilaterally. No filling defect to suggest pulmonary embolism is noted. Scattered coronary calcifications are noted. 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 Mild contour deformity is noted suggesting stability. Mediastinum/Nodes: Thoracic inlet is within normal limits. No sizable hilar or mediastinal adenopathy is noted. Calcified lymph nodes are noted within the hila and mediastinum consistent with prior granulomatous disease. Lungs/Pleura: Lungs are well aerated bilaterally. Scattered calcified granulomas are seen. Mild emphysematous change is noted. Mild right basilar atelectasis is noted. Upper Abdomen: Visualized upper abdomen shows calcified splenic granulomas. No other focal abnormality is seen. Vascular calcifications are noted. Musculoskeletal: No acute rib abnormality is noted. Degenerative changes of the thoracic spine are seen.  No compression deformity is noted. Review of the MIP images confirms the above findings. IMPRESSION: No evidence of pulmonary emboli. 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 The overall morphology of the aorta is stable however. Changes consistent with prior granulomatous disease. Aortic Atherosclerosis (ICD10-I70.0) and Emphysema (ICD10-J43.9). Electronically Signed   By: Alcide Clever M.D.   On: 04/06/2023 20:40        Scheduled Meds:  aspirin EC  81 mg Oral Daily   atorvastatin  80 mg Oral QHS   budesonide (PULMICORT) nebulizer solution  0.25 mg Nebulization BID   clopidogrel  75 mg Oral QHS   heparin  5,000 Units  Subcutaneous Q8H   methylPREDNISolone (SOLU-MEDROL) injection  40 mg Intravenous Q12H   pantoprazole  40 mg Oral Daily   sodium chloride flush  3 mL Intravenous Q12H   Continuous Infusions:  sodium chloride     sodium chloride       LOS: 1 day    Time spent: 35 minutes.    Berton Mount, MD  [facility] Pager #: [phone] 7PM-7AM contact night coverage as above

## 2023-04-08 NOTE — TOC CM/SW Note (Signed)
Transition of Care [facility]) - Inpatient Brief Assessment   Patient Details  Name: Marie Johnson MRN: [id number] Date of Birth: 01-25-1951  [facility] CM/SW Contact:    Harriet Masson, RN Phone Number: 04/08/2023, 12:22 PM   Clinical Narrative: Heart cath scheduled today.  TOC following.   Transition of Care Asessment: Insurance and Status: Insurance coverage has been reviewed Patient has primary care physician: Yes Home environment has been reviewed: safe to discharge home Prior level of function:: independent Prior/Current Home Services: No current home services Social Determinants of Health Reivew: SDOH reviewed no interventions necessary Readmission risk has been reviewed: Yes Transition of care needs: no transition of care needs at this time

## 2023-04-08 NOTE — Progress Notes (Signed)
Mobility Specialist Progress Note:   04/08/23 1400  Mobility  Activity Ambulated with assistance in hallway  Level of Assistance Contact guard assist, steadying assist  Assistive Device Front wheel walker  Distance Ambulated (ft) 200 ft  Activity Response Tolerated well  Mobility Referral Yes  $Mobility charge 1 Mobility  Mobility Specialist Start Time (ACUTE ONLY) 1420  Mobility Specialist Stop Time (ACUTE ONLY) 1440  Mobility Specialist Time Calculation (min) (ACUTE ONLY) 20 min    Pre Mobility: 77 HR,  155/87 (109) BP,  97% SpO2 During Mobility: 109 HR,  94% SpO2 Post Mobility:  94 HR,  94% SpO2  Pt received in bed on 3L O2, agreeable to mobility. Mod I for bed mobility. Ambulated w/ RW and contact guard. Asymptomatic throughout. Pt left on EOB with call bell. NT aware.  Marie Johnson Mobility Specialist Please contact via Special educational needs teacher or Rehab office at [phone]

## 2023-04-08 NOTE — Progress Notes (Signed)
ANTICOAGULATION CONSULT NOTE - Follow Up Consult  Pharmacy Consult for Heparin Indication: chest pain/ACS  Allergies  Allergen Reactions   Bee Venom Anaphylaxis and Other (See Comments)    Welts, also    Insect bites- Welts, also   Insect Extract Anaphylaxis and Other (See Comments)    Insect bites- Welts, also   Farxiga [Dapagliflozin] Nausea And Vomiting   Lyrica [Pregabalin] Nausea And Vomiting   Neurontin [Gabapentin] Nausea And Vomiting and Other (See Comments)    Hypertension/dizziness   Nsaids Nausea Only and Other (See Comments)    Patient is not suppose to have this class of medication (liver)  Can tolerate 81mg , which she is taking   Latex Rash and Other (See Comments)    Patient Measurements: Weight: 55.7 kg (122 lb 12.7 oz) Heparin Dosing Weight: 58.2 kg  Vital Signs: Temp: 97.8 F (36.6 C) (07/22 0700) Temp Source: Oral (07/22 0700) BP: 130/93 (07/22 0700) Pulse Rate: 96 (07/22 0700)  Labs: Recent Labs    04/06/23 1755 04/06/23 1910 04/06/23 2316 04/07/23 0125 04/07/23 0810 04/07/23 1226 04/07/23 1836 04/08/23 0655  HGB 12.9  --   --   --   --   --   --  13.2  HCT 38.3  --   --   --   --   --   --  40.1  PLT 244  --   --   --   --   --   --  243  HEPARINUNFRC  --   --   --   --  <0.10*  --  0.17* 0.37  CREATININE 0.68  --   --   --   --  0.68  --  0.76  TROPONINIHS 62* 112* 190* 196*  --   --   --   --     Estimated Creatinine Clearance: 55.1 mL/min (by C-G formula based on SCr of 0.76 mg/dL).   Medical History: Past Medical History:  Diagnosis Date   AAA (abdominal aortic aneurysm) (HCC)    3.4 cm   Anemia    Anxiety    Aortic atherosclerosis (HCC)    Arthritis    Asthma    CAD (coronary artery disease)    a. Nonobstructive CAD by cath 07/17/13.   Cancer [facility])    Carotid artery disease (HCC)    Carotid US 8/22: Bilateral ICA 1-39; right subclavian stenosis; right thyroid nodule   CHD (congenital heart disease)    pt unaware???    Chest pain    a. Adm 10-07/2013 - CTA neg for PE/dissection, cath with nonobstructive disease, CP ?due to uncontrolled HTN vs coronary vasospasm.   CHF (congestive heart failure) (HCC)    Closed head injury with concussion    COPD (chronic obstructive pulmonary disease) (HCC)    Coronary artery disease    Echocardiogram    Echocardiogram 06/2019: EF 60-65, impaired relaxation (Gr 1 DD), mild MAC, mild MR, mild to mod aortic valve sclerosis (no AS), trivial TR, mild LAE, normal RVSF, RVSP 30.7 (mildly elevated)   GERD (gastroesophageal reflux disease)    History of blood transfusion    childbirth, Hysterectomy   Hyperlipidemia    Hypertension    Neuropathy    OA (osteoarthritis)    Osteoporosis    PAD (peripheral artery disease) (HCC)    a. s/p L fem-pop bypass b. recent evaluation at [facility], unamenable to intervention   PAD (peripheral artery disease) (HCC)    Palpitations 02/22/2016   Pneumonia  Pre-diabetes    Premature ventricular contraction    PUD (peptic ulcer disease)    PVD (peripheral vascular disease) (HCC) 07/17/2013   Thoracic aortic aneurysm (HCC)    Chest/aorta CTA 04/2022: 14 x 8 focal saccular aneurysm versus penetrating atherosclerotic ulcer involving left side of proximal portion of transverse aortic arch; descending thoracic aorta and proximal abdominal aorta aneurysm 4.4 cm; aortic atherosclerosis; emphysema   Tobacco abuse    Wears dentures    Wears glasses     Medications:  Medications Prior to Admission  Medication Sig Dispense Refill Last Dose   albuterol (PROVENTIL HFA;VENTOLIN HFA) 108 (90 BASE) MCG/ACT inhaler Inhale 2 puffs into the lungs every 6 (six) hours as needed for wheezing or shortness of breath.   04/06/2023   albuterol (PROVENTIL) (2.5 MG/3ML) 0.083% nebulizer solution Take 2.5 mg by nebulization every 6 (six) hours as needed for wheezing or shortness of breath.   UNKNOWN   alendronate (FOSAMAX) 70 MG tablet Take 70 mg by mouth every Wednesday.    04/03/2023 at am   allopurinol (ZYLOPRIM) 300 MG tablet Take 300 mg by mouth every evening.   04/05/2023 at pm   aspirin EC 81 MG tablet Take 81 mg by mouth in the morning.   04/06/2023 at am   atorvastatin (LIPITOR) 80 MG tablet Take 80 mg by mouth at bedtime.   04/05/2023 at pm   calcium carbonate (ANTACID) 500 MG chewable tablet Chew 1 tablet by mouth 2 (two) times daily as needed for heartburn or indigestion.   Past Week   Cholecalciferol (VITAMIN D) 125 MCG (5000 UT) CAPS Take 5,000 Units by mouth in the morning.   04/06/2023 at am   clopidogrel (PLAVIX) 75 MG tablet Take 1 tablet (75 mg total) by mouth daily. (Patient taking differently: Take 75 mg by mouth at bedtime.) 30 tablet 11 04/05/2023 at pm   cyanocobalamin (VITAMIN B12) 1000 MCG tablet Take 1,000 mcg by mouth daily.   04/06/2023 at am   ezetimibe (ZETIA) 10 MG tablet Take 1 tablet (10 mg total) by mouth daily. (Patient taking differently: Take 10 mg by mouth at bedtime.) 90 tablet 2 04/05/2023 at pm   furosemide (LASIX) 40 MG tablet Take 1 tablet (40 mg total) by mouth daily. 90 tablet 3 04/06/2023 at am   lactulose (CHRONULAC) 10 GM/15ML solution Take 20 g by mouth 2 (two) times daily as needed for moderate constipation.   04/04/2023   melatonin 5 MG TABS Take 5 mg by mouth at bedtime.   04/05/2023 at pm   Menthol-Methyl Salicylate (MUSCLE RUB) 10-15 % CREA Apply 1 application topically daily as needed (for arthritis pain).   Past Week   nitroGLYCERIN (NITROSTAT) 0.4 MG SL tablet Place 1 tablet (0.4 mg total) under the tongue every 5 (five) minutes x 3 doses as needed for chest pain. 25 tablet 4 UNKNOWN   Oxycodone HCl 10 MG TABS Take 1 tablet (10 mg total) by mouth 4 (four) times daily as needed (for pain). 20 tablet 0 04/06/2023 at am   pantoprazole (PROTONIX) 40 MG tablet Take 40 mg by mouth 2 (two) times daily.   04/06/2023 at am   potassium chloride SA (KLOR-CON M20) 20 MEQ tablet Take 1 tablet (20 mEq total) by mouth daily. 90 tablet 1  04/06/2023 at am   senna-docusate (SENOKOT-S) 8.6-50 MG tablet Take 2 tablets by mouth at bedtime.   04/05/2023 at pm   SPIRIVA RESPIMAT 2.5 MCG/ACT AERS Take 2 puffs by mouth daily.  04/06/2023 at am   spironolactone (ALDACTONE) 25 MG tablet Take 1 tablet (25 mg total) by mouth daily. 90 tablet 3 04/06/2023 at am   verapamil (CALAN-SR) 240 MG CR tablet Take 1 tablet (240 mg total) by mouth daily. 90 tablet 2 04/06/2023 at am   EPIPEN 2-PAK 0.3 MG/0.3ML SOAJ injection Inject 0.3 mg into the muscle daily as needed for anaphylaxis. Use as directed as needed for allergies. (Patient not taking: Reported on 04/06/2023)   Not Taking   Scheduled:   aspirin EC  81 mg Oral Daily   atorvastatin  80 mg Oral QHS   budesonide (PULMICORT) nebulizer solution  0.25 mg Nebulization BID   clopidogrel  75 mg Oral QHS   methylPREDNISolone (SOLU-MEDROL) injection  40 mg Intravenous Q12H   pantoprazole  40 mg Oral Daily   sodium chloride flush  3 mL Intravenous Q12H   Infusions:   sodium chloride     sodium chloride 70 mL/hr at 04/08/23 0924   heparin 1,050 Units/hr (04/08/23 0747)   PRN: sodium chloride, acetaminophen, albuterol, albuterol, nitroGLYCERIN, ondansetron (ZOFRAN) IV, oxyCODONE, sodium chloride flush  Assessment: 71 yof with a history of HF, PAD s/p bypass, CAD, laryngeal cancer, COPD, AAA, HTN, HLD, tobacco use. Patient is presenting with SOB. Heparin per pharmacy consult placed for chest pain/ACS. Patient was not on anticoagulation prior to arrival.  Heparin level 0.37 (therapeutic) on 1050units/hr heparin. CBC stable  Goal of Therapy:  Heparin level 0.3-0.7 units/ml Monitor platelets by anticoagulation protocol: Yes   Plan:  -Continue heparin  1050 units/hr -Will follow plans post cath  Harland German, PharmD Clinical Pharmacist **Pharmacist phone directory can now be found on [email or web address] ([email or web address]).  Listed under [facility].

## 2023-04-08 NOTE — H&P (View-Only) (Signed)
Progress Note  Patient Name: Marie Johnson Date of Encounter: 04/08/2023  Primary Cardiologist: Meriam Sprague, MD   Subjective   Patient seen and examined at her bedside. She is aware of her LHC today. Has no specific questions.   Inpatient Medications    Scheduled Meds:  aspirin EC  81 mg Oral Daily   atorvastatin  80 mg Oral QHS   budesonide (PULMICORT) nebulizer solution  0.25 mg Nebulization BID   clopidogrel  75 mg Oral QHS   furosemide  40 mg Intravenous Q12H   ipratropium-albuterol  3 mL Nebulization QID   methylPREDNISolone (SOLU-MEDROL) injection  40 mg Intravenous Q12H   pantoprazole  40 mg Oral Daily   Continuous Infusions:  heparin 1,050 Units/hr (04/08/23 0747)   PRN Meds: acetaminophen, albuterol, albuterol, nitroGLYCERIN, ondansetron (ZOFRAN) IV, oxyCODONE   Vital Signs    Vitals:   04/07/23 1900 04/07/23 2300 04/08/23 0445 04/08/23 0700  BP: (!) 152/75 132/89 (!) 159/79 (!) 130/93  Pulse: 86 80 79 96  Resp: 20 (!) 23 18 (!) 25  Temp: 99 F (37.2 C) 97.6 F (36.4 C) (!) 97.5 F (36.4 C) 97.8 F (36.6 C)  TempSrc: Oral Oral Oral Oral  SpO2: 98% 97%  98%    Intake/Output Summary (Last 24 hours) at 04/08/2023 6433 Last data filed at 04/08/2023 0747 Gross per 24 hour  Intake 128.88 ml  Output --  Net 128.88 ml   There were no vitals filed for this visit.  Telemetry    Sinus rhythm - Personally Reviewed  ECG    none - Personally Reviewed  Physical Exam   General: Comfortable Head: Atraumatic, normal size  Eyes: PEERLA, EOMI  Neck: Supple, normal JVD Cardiac: Normal S1, S2; RRR; no murmurs, rubs, or gallops Lungs: Clear to auscultation bilaterally Abd: Soft, nontender, no hepatomegaly  Ext: warm, no edema Musculoskeletal: No deformities, BUE and BLE strength normal and equal Skin: Warm and dry, no rashes   Neuro: Alert and oriented to person, place, time, and situation, CNII-XII grossly intact, no focal deficits  Psych:  Normal mood and affect   Labs    Chemistry Recent Labs  Lab 04/06/23 1755 04/07/23 1226  NA 137 131*  K 3.1* 4.2  CL 100 97*  CO2 26 27  GLUCOSE 100* 92  BUN 10 12  CREATININE 0.68 0.68  CALCIUM 8.6* 8.4*  PROT 6.4*  --   ALBUMIN 3.1* 2.8*  AST 23  --   ALT 20  --   ALKPHOS 47  --   BILITOT 0.7  --   GFRNONAA >60 >60  ANIONGAP 11 7     Hematology Recent Labs  Lab 04/06/23 1755  WBC 10.5  RBC 3.94  HGB 12.9  HCT 38.3  MCV 97.2  MCH 32.7  MCHC 33.7  RDW 21.2*  PLT 244    Cardiac EnzymesNo results for input(s): "TROPONINI" in the last 168 hours. No results for input(s): "TROPIPOC" in the last 168 hours.   BNP Recent Labs  Lab 04/06/23 1756  BNP 265.9*     DDimer  Recent Labs  Lab 04/06/23 1755  DDIMER 1.22*     Radiology    ECHOCARDIOGRAM COMPLETE  Result Date: 04/07/2023    ECHOCARDIOGRAM REPORT   Patient Name:   Marie Johnson Date of Exam: 04/07/2023 Medical Rec #:  [id number]      Height:       67.0 in Accession #:    [id number]  Weight:       128.4 lb Date of Birth:  01/26/1951      BSA:          1.675 m Patient Age:    71 years       BP:           138/73 mmHg Patient Gender: F              HR:           75 bpm. Exam Location:  Inpatient Procedure: 2D Echo, Color Doppler and Cardiac Doppler Indications:    Elevated Troponin  History:        Patient has prior history of Echocardiogram examinations, most                 recent 03/10/2020. CAD; COPD.  Sonographer:    Darlys Gales Referring Phys: [id number] JARED M GARDNER IMPRESSIONS  1. Small region of apical hypokinesis/akinesis. Basal inferior hypokinesis. . Left ventricular ejection fraction, by estimation, is 55 to 60%. The left ventricle has normal function.  2. Right ventricular systolic function is normal. The right ventricular size is normal.  3. Left atrial size was mildly dilated.  4. The mitral valve is normal in structure. Mild mitral valve regurgitation.  5. The aortic valve is tricuspid. Aortic  valve regurgitation is not visualized. Aortic valve sclerosis is present, with no evidence of aortic valve stenosis. FINDINGS  Left Ventricle: Small region of apical hypokinesis/akinesis. Basal inferior hypokinesis. Left ventricular ejection fraction, by estimation, is 55 to 60%. The left ventricle has normal function. The left ventricular internal cavity size was normal in size. There is no left ventricular hypertrophy. Right Ventricle: The right ventricular size is normal. Right vetricular wall thickness was not assessed. Right ventricular systolic function is normal. Left Atrium: Left atrial size was mildly dilated. Right Atrium: Right atrial size was normal in size. Pericardium: Trivial pericardial effusion is present. Mitral Valve: The mitral valve is normal in structure. There is mild thickening of the mitral valve leaflet(s). Mild mitral annular calcification. Mild mitral valve regurgitation. MV peak gradient, 5.3 mmHg. The mean mitral valve gradient is 3.0 mmHg. Tricuspid Valve: The tricuspid valve is normal in structure. Tricuspid valve regurgitation is trivial. Aortic Valve: The aortic valve is tricuspid. Aortic valve regurgitation is not visualized. Aortic valve sclerosis is present, with no evidence of aortic valve stenosis. Aortic valve mean gradient measures 3.0 mmHg. Aortic valve peak gradient measures 5.5  mmHg. Aortic valve area, by VTI measures 3.00 cm. Pulmonic Valve: The pulmonic valve was not well visualized. Pulmonic valve regurgitation is not visualized. Aorta: The aortic root is normal in size and structure. IAS/Shunts: No atrial level shunt detected by color flow Doppler.  LEFT VENTRICLE PLAX 2D LVIDd:         4.50 cm   Diastology LVIDs:         2.80 cm   LV e' medial:    5.33 cm/s LV PW:         0.80 cm   LV E/e' medial:  19.3 LV IVS:        0.80 cm   LV e' lateral:   4.68 cm/s LVOT diam:     1.80 cm   LV E/e' lateral: 22.0 LV SV:         67 LV SV Index:   40 LVOT Area:     2.54 cm  RIGHT  VENTRICLE RV S prime:     9.25 cm/s TAPSE (M-mode):  1.9 cm LEFT ATRIUM             Index        RIGHT ATRIUM          Index LA Vol (A2C):   53.9 ml 32.18 ml/m  RA Area:     9.48 cm LA Vol (A4C):   54.9 ml 32.77 ml/m  RA Volume:   15.90 ml 9.49 ml/m LA Biplane Vol: 56.6 ml 33.79 ml/m  AORTIC VALVE AV Area (Vmax):    2.44 cm AV Area (Vmean):   2.48 cm AV Area (VTI):     3.00 cm AV Vmax:           117.00 cm/s AV Vmean:          83.600 cm/s AV VTI:            0.223 m AV Peak Grad:      5.5 mmHg AV Mean Grad:      3.0 mmHg LVOT Vmax:         112.00 cm/s LVOT Vmean:        81.500 cm/s LVOT VTI:          0.263 m LVOT/AV VTI ratio: 1.18  AORTA Ao Root diam: 2.30 cm MITRAL VALVE MV Area (PHT): 3.61 cm     SHUNTS MV Area VTI:   2.60 cm     Systemic VTI:  0.26 m MV Peak grad:  5.3 mmHg     Systemic Diam: 1.80 cm MV Mean grad:  3.0 mmHg MV Vmax:       1.15 m/s MV Vmean:      85.5 cm/s MV Decel Time: 210 msec MV E velocity: 103.00 cm/s MV A velocity: 109.00 cm/s MV E/A ratio:  0.94 Dietrich Pates MD Electronically signed by Dietrich Pates MD Signature Date/Time: 04/07/2023/3:27:13 PM    Final    CT Angio Chest PE W and/or Wo Contrast  Result Date: 04/06/2023 CLINICAL DATA:  Positive D-dimer and shortness of breath, initial encounter EXAM: CT ANGIOGRAPHY CHEST WITH CONTRAST TECHNIQUE: Multidetector CT imaging of the chest was performed using the standard protocol during bolus administration of intravenous contrast. Multiplanar CT image reconstructions and MIPs were obtained to evaluate the vascular anatomy. RADIATION DOSE REDUCTION: This exam was performed according to the departmental dose-optimization program which includes automated exposure control, adjustment of the mA and/or kV according to patient size and/or use of iterative reconstruction technique. CONTRAST:  75mL OMNIPAQUE IOHEXOL 350 MG/ML SOLN COMPARISON:  Chest x-ray from earlier in the same day, CT from 04/20/2022 FINDINGS: Cardiovascular: Atherosclerotic  calcifications of the thoracic aorta are noted. No ascending aneurysmal dilatation is seen. No significant aortic opacification is noted to suggest dissection. Descending aorta demonstrates stable dilatation at the level of the aortic hiatus measuring up to 4.5 cm. This is relatively stable from the prior exam. Heart is mildly enlarged in size. The pulmonary artery shows a normal branching pattern bilaterally. No filling defect to suggest pulmonary embolism is noted. Scattered coronary calcifications are noted. 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 Mild contour deformity is noted suggesting stability. Mediastinum/Nodes: Thoracic inlet is within normal limits. No sizable hilar or mediastinal adenopathy is noted. Calcified lymph nodes are noted within the hila and mediastinum consistent with prior granulomatous disease. Lungs/Pleura: Lungs are well aerated bilaterally. Scattered calcified granulomas are seen. Mild emphysematous change is noted. Mild right basilar atelectasis is noted.  Upper Abdomen: Visualized upper abdomen shows calcified splenic granulomas. No other focal abnormality is seen. Vascular calcifications are noted. Musculoskeletal: No acute rib abnormality is noted. Degenerative changes of the thoracic spine are seen. No compression deformity is noted. Review of the MIP images confirms the above findings. IMPRESSION: No evidence of pulmonary emboli. 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 The overall morphology of the aorta is stable however. Changes consistent with prior granulomatous disease. Aortic Atherosclerosis (ICD10-I70.0) and Emphysema (ICD10-J43.9). Electronically Signed   By: Alcide Clever M.D.   On: 04/06/2023 20:40   DG Chest Port 1  View  Result Date: 04/06/2023 CLINICAL DATA:  Shortness of breath EXAM: PORTABLE CHEST 1 VIEW COMPARISON:  Chest x-ray 03/18/2019 FINDINGS: There some atelectasis in the lung bases. There is a small left pleural effusion. There is no pneumothorax or acute fracture. The heart is mildly enlarged, unchanged. IMPRESSION: 1. Small left pleural effusion. 2. Bibasilar atelectasis. Electronically Signed   By: Darliss Cheney M.D.   On: 04/06/2023 17:27    Cardiac Studies   TTE 04/04/2023 IMPRESSIONS     1. Small region of apical hypokinesis/akinesis. Basal inferior  hypokinesis. . Left ventricular ejection fraction, by estimation, is 55 to  60%. The left ventricle has normal function.   2. Right ventricular systolic function is normal. The right ventricular  size is normal.   3. Left atrial size was mildly dilated.   4. The mitral valve is normal in structure. Mild mitral valve  regurgitation.   5. The aortic valve is tricuspid. Aortic valve regurgitation is not  visualized. Aortic valve sclerosis is present, with no evidence of aortic  valve stenosis.   FINDINGS   Left Ventricle: Small region of apical hypokinesis/akinesis. Basal  inferior hypokinesis. Left ventricular ejection fraction, by estimation,  is 55 to 60%. The left ventricle has normal function. The left ventricular  internal cavity size was normal in  size. There is no left ventricular hypertrophy.   Right Ventricle: The right ventricular size is normal. Right vetricular  wall thickness was not assessed. Right ventricular systolic function is  normal.   Left Atrium: Left atrial size was mildly dilated.   Right Atrium: Right atrial size was normal in size.   Pericardium: Trivial pericardial effusion is present.   Mitral Valve: The mitral valve is normal in structure. There is mild  thickening of the mitral valve leaflet(s). Mild mitral annular  calcification. Mild mitral valve regurgitation. MV peak gradient, 5.3  mmHg. The  mean mitral valve gradient is 3.0 mmHg.   Tricuspid Valve: The tricuspid valve is normal in structure. Tricuspid  valve regurgitation is trivial.   Aortic Valve: The aortic valve is tricuspid. Aortic valve regurgitation is  not visualized. Aortic valve sclerosis is present, with no evidence of  aortic valve stenosis. Aortic valve mean gradient measures 3.0 mmHg.  Aortic valve peak gradient measures 5.5   mmHg. Aortic valve area, by VTI measures 3.00 cm.   Pulmonic Valve: The pulmonic valve was not well visualized. Pulmonic valve  regurgitation is not visualized.   Aorta: The aortic root is normal in size and structure.   IAS/Shunts: No atrial level shunt detected by color flow Doppler.   Patient Profile     72 y.o. female with a history of congestive heart failure, nonobstructive CAD, peripheral artery disease status post lower extremity bypass, COPD, active smoker presented for shortness of  breath found to have elevated troponin.  Assessment & Plan    NSTEMI HTN  COPD Hyperlipidemia PAD  Plan for cardiac catheterization today.  Informed Consent   Shared Decision Making/Informed Consent The risks [stroke (1 in 1000), death (1 in 1000), kidney failure [usually temporary] (1 in 500), bleeding (1 in 200), allergic reaction [possibly serious] (1 in 200)], benefits (diagnostic support and management of coronary artery disease) and alternatives of a cardiac catheterization were discussed in detail with Ms. Emanuele and she is willing to proceed.  Hypertensive this morning, blood pressure has been slightly elevated throughout the hospitalization.  May benefit from low-dose antihypertensive medication postcatheterization if still elevated.  Amlodipine 5 mg daily can be started.  COPD/tobacco use -smoking cessation advised.  Patient not amenable at this time to quit smoking.  Hyperlipidemia - continue with current statin medication.  Peripheral artery disease-continue aspirin and  Plavix.  She is also on a statin continue this.         For questions or updates, please contact [facility] Please consult [email or web address] for contact info under Cardiology/STEMI.      Signed,  , DO  04/08/2023, 8:21 AM

## 2023-04-09 ENCOUNTER — Encounter (HOSPITAL_COMMUNITY): Payer: Self-pay | Admitting: Cardiology

## 2023-04-09 DIAGNOSIS — I2489 Other forms of acute ischemic heart disease: Secondary | ICD-10-CM | POA: Diagnosis not present

## 2023-04-09 DIAGNOSIS — I214 Non-ST elevation (NSTEMI) myocardial infarction: Secondary | ICD-10-CM | POA: Diagnosis not present

## 2023-04-09 LAB — LIPOPROTEIN A (LPA): Lipoprotein (a): 183.3 nmol/L — ABNORMAL HIGH (ref <75.0)

## 2023-04-09 MED ORDER — IPRATROPIUM-ALBUTEROL 0.5-2.5 (3) MG/3ML IN SOLN
3.0000 mL | Freq: Two times a day (BID) | RESPIRATORY_TRACT | Status: AC
  Administered 2023-04-09 – 2023-04-10 (×4): 3 mL via RESPIRATORY_TRACT
  Filled 2023-04-09 (×4): qty 3

## 2023-04-09 MED ORDER — IPRATROPIUM-ALBUTEROL 0.5-2.5 (3) MG/3ML IN SOLN: 3.0000 mL | Freq: Four times a day (QID) | RESPIRATORY_TRACT | Status: DC

## 2023-04-09 NOTE — Progress Notes (Signed)
PROGRESS NOTE    Marie Johnson  ZOX:[id number] DOB: [birth year] DOA: 04/06/2023 PCP: Ailene Ravel, MD   Brief Narrative:  Patient is a 72 year old female past medical history significant for COPD, greater than 53 pack years, tobacco abuse, continuous, diastolic congestive heart failure, peripheral artery disease s/p bypass, nonocclusive coronary artery disease and recent diagnosis of laryngeal cancer.  Patient presented with 3-day history of difficulty breathing and left-sided chest pain, without any radiation.  Workup done on presentation revealed troponin of 196 today, cardiac BNP of 365.  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  Patient is on treatment for NSTEMI and COPD with exacerbation.  Cardiology input is appreciated.  Patient is on heparin drip.  Left heart catheterization is planned for tomorrow.   Echocardiogram revealed: 1. Small region of apical hypokinesis/akinesis. Basal inferior  hypokinesis. . Left ventricular ejection fraction, by estimation, is 55 to  60%. The left ventricle has normal function.   2. Right ventricular systolic function is normal. The right ventricular  size is normal.   3. Left atrial size was mildly dilated.   4. The mitral valve is normal in structure. Mild mitral valve  regurgitation.   5. The aortic valve is tricuspid. Aortic valve regurgitation is not  visualized. Aortic valve sclerosis is present, with no evidence of aortic  valve stenosis.    Cardiac catheterization done on 04/08/2023 revealed: Diffuse Coronary Calcification with only mild-moderate non-obstructive disease - NO Culprit Lesion to explain + Troponin -- DEMAND ISCHEMIA not NSTEMI Normal LVEDP ~11 mmHg   04/09/23:  Patient ambulating in the hallways and continues to require 3 L nasal cannula and is still wheezing and short of breath.  Continue ongoing COPD treatment until ready for discharge.   Assessment & Plan:   Principal Problem:   Demand ischemia Active Problems:   CAD (coronary artery disease)   COPD (chronic obstructive pulmonary disease) (HCC)   Laryngeal cancer (HCC)   Hypertension   Chronic diastolic CHF (congestive heart failure) (HCC)  Assessment and Plan:   Demand ischemia:  -Cardiac catheterization ruled out NSTEMI (non-ST elevated myocardial infarction).  See above documentation.   -CTA chest is neg for PE No contrast in aorta, but aorta appears grossly unchanged from prior exams per radiologist. NSTEMI pathway Tele monitor Cards coming to evaluate: Recd heparin gtt which has been started SL NTG PRN for the moment Giving 5mg  IV metoprolol x1 now for persistent tachycardia to 120s-130s with BP 160/100 currently. Cont lipitor Got ASA in ED, cont ASA 81 daily Cont at bedtime plavix which she takes for PAD NPO after MN   04/08/2023: Optimize COPD exacerbation treatment.   Laryngeal cancer [facility]) Recent diagnosis on biopsy by ENT. EDP d/w Dr. 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 Dr. Jenne Pane notes that the mass was very small on scope He isnt concerned that mass could be occluding airway at this time, doubts that this is cause of her presentation today.   COPD (chronic obstructive pulmonary disease) (HCC) Pt with known COPD and ongoing smoking.  However: no wheezing on todays presentation, and current room O2 sat of 88%, while low, does appear c/w her baseline as documented by cardiologist office note x2 days ago.  Patient does not have oxygen requirement at home. COPD  exacerbation is possible but seems less likely than cardiac cause of presentation today. Cont home scheduled and PRN nebs 04/07/2023: Start patient on IV Solu-Medrol, nebs DuoNeb  and Pulmicort. 04/09/2023: Symptoms continue to improve.  Continue above regimen.  Patient has intentions to quit cigarette smoking.  Follow-up with pulmonary team on discharge.   Chronic diastolic CHF (congestive heart failure) (HCC) BNP slightly elevated at 265 today, but no evidence of pulmonary edema or volume overload on imaging studies. Dont think CHF exacerbation is responsible for todays presentation either. Cards coming to see patient as well Hold home diuretics until they can evaluate and make recs. 04/07/2023: IV Lasix 40 Mg every 12 x 2 doses. Patient now on p.o. medications per cardiology who has signed off.   Hypertension -Restart Aldactone 25 Mg p.o. once daily and verapamil 240 Mg p.o. once daily. -Goal blood pressure should be less than 130/80 mmHg.   DVT prophylaxis: Heparin Code Status: Full code. Family Communication:  Disposition Plan: Home eventually     Consultants:  Cardiology s/o 7/22   Procedures:  Left heart catheterization-see above documentation.. Echocardiogram findings as documented above.   Antimicrobials:  None.    Subjective: Patient seen and evaluated today while ambulating in the hallway.  She continues to have significant shortness of breath and wheezing and still remains on 3 L nasal cannula.  Typically on room air at home.  Objective: Vitals:   04/09/23 0930 04/09/23 0931 04/09/23 1052 04/09/23 1104  BP:    (!) 167/69  Pulse:    100  Resp:    18  Temp:    99.1 F (37.3 C)  TempSrc:    Oral  SpO2: (!) 89% 93% 92% 94%  Weight:        Intake/Output Summary (Last 24 hours) at 04/09/2023 1109 Last data filed at 04/09/2023 0911 Gross per 24 hour  Intake 1308.75 ml  Output 150 ml  Net 1158.75 ml   Filed Weights   04/08/23 1007  Weight: 55.7 kg    Examination:  General exam: Appears calm and comfortable  Respiratory system: Bilateral wheezing, 3 L nasal cannula. Cardiovascular system: S1 & S2 heard, RRR.  Gastrointestinal  system: Abdomen is soft Central nervous system: Alert and awake Extremities: No edema Skin: No significant lesions noted Psychiatry: Flat affect.    Data Reviewed: I have personally reviewed following labs and imaging studies  CBC: Recent Labs  Lab 04/06/23 1755 04/08/23 0655  WBC 10.5 7.0  HGB 12.9 13.2  HCT 38.3 40.1  MCV 97.2 98.3  PLT 244 243   Basic Metabolic Panel: Recent Labs  Lab 04/06/23 1755 04/07/23 1226 04/08/23 0655  NA 137 131* 136  K 3.1* 4.2 4.5  CL 100 97* 105  CO2 26 27 25   GLUCOSE 100* 92 175*  BUN 10 12 13   CREATININE 0.68 0.68 0.76  CALCIUM 8.6* 8.4* 8.7*  MG  --  1.8 2.0  PHOS  --  3.0 3.4   GFR: Estimated Creatinine Clearance: 55.1 mL/min (by C-G formula based on SCr of 0.76 mg/dL). Liver Function Tests: Recent Labs  Lab 04/06/23 1755 04/07/23 1226 04/08/23 0655  AST 23  --   --   ALT 20  --   --   ALKPHOS 47  --   --   BILITOT 0.7  --   --   PROT 6.4*  --   --   ALBUMIN 3.1* 2.8* 3.0*   No results for input(s): "LIPASE", "AMYLASE" in the last 168  hours. No results for input(s): "AMMONIA" in the last 168 hours. Coagulation Profile: No results for input(s): "INR", "PROTIME" in the last 168 hours. Cardiac Enzymes: No results for input(s): "CKTOTAL", "CKMB", "CKMBINDEX", "TROPONINI" in the last 168 hours. BNP (last 3 results) No results for input(s): "PROBNP" in the last 8760 hours. HbA1C: No results for input(s): "HGBA1C" in the last 72 hours. CBG: No results for input(s): "GLUCAP" in the last 168 hours. Lipid Profile: No results for input(s): "CHOL", "HDL", "LDLCALC", "TRIG", "CHOLHDL", "LDLDIRECT" in the last 72 hours. Thyroid Function Tests: No results for input(s): "TSH", "T4TOTAL", "FREET4", "T3FREE", "THYROIDAB" in the last 72 hours. Anemia Panel: No results for input(s): "VITAMINB12", "FOLATE", "FERRITIN", "TIBC", "IRON", "RETICCTPCT" in the last 72 hours. Sepsis Labs: Recent Labs  Lab 04/06/23 2314 04/06/23 2316   PROCALCITON  --  <0.10  LATICACIDVEN 1.0  --     Recent Results (from the past 240 hour(s))  Resp panel by RT-PCR (RSV, Flu A&B, Covid) Anterior Nasal Swab     Status: None   Collection Time: 04/06/23  5:55 PM   Specimen: Anterior Nasal Swab  Result Value Ref Range Status   SARS Coronavirus 2 by RT PCR NEGATIVE NEGATIVE Final   Influenza A by PCR NEGATIVE NEGATIVE Final   Influenza B by PCR NEGATIVE NEGATIVE Final    Comment: (NOTE) The Xpert Xpress SARS-CoV-2/FLU/RSV plus assay is intended as an aid in the diagnosis of influenza from Nasopharyngeal swab specimens and should not be used as a sole basis for treatment. Nasal washings and aspirates are unacceptable for Xpert Xpress SARS-CoV-2/FLU/RSV testing.  Fact Sheet for Patients: BloggerCourse.com  Fact Sheet for Healthcare Providers: SeriousBroker.it  This test is not yet approved or cleared by the [place] FDA and has been authorized for detection and/or diagnosis of SARS-CoV-2 by FDA under an Emergency Use Authorization (EUA). This EUA will remain in effect (meaning this test can be used) for the duration of the COVID-19 declaration under Section 564(b)(1) of the Act, 21 U.S.C. section 360bbb-3(b)(1), unless the authorization is terminated or revoked.     Resp Syncytial Virus by PCR NEGATIVE NEGATIVE Final    Comment: (NOTE) Fact Sheet for Patients: BloggerCourse.com  Fact Sheet for Healthcare Providers: SeriousBroker.it  This test is not yet approved or cleared by the [place] FDA and has been authorized for detection and/or diagnosis of SARS-CoV-2 by FDA under an Emergency Use Authorization (EUA). This EUA will remain in effect (meaning this test can be used) for the duration of the COVID-19 declaration under Section 564(b)(1) of the Act, 21 U.S.C. section 360bbb-3(b)(1), unless the authorization is  terminated or revoked.  Performed at [facility], [address] [place], [place] [address]          Radiology Studies: CARDIAC CATHETERIZATION  Result Date: 04/08/2023   Dist LM to Mid LAD lesion is 15% stenosed.  Calcified   Mid LAD lesion is 40% stenosed.  Calcified   1st Mrg lesion is 5% stenosed.  Calcified   LV end diastolic pressure is normal.   Recommend Aspirin 81mg  daily for moderate CAD.  POST-OPERATIVE DIAGNOSIS:  Diffuse Coronary Calcification with only mild-moderate non-obstructive disease - NO Culprit Lesion to explain + Troponin -- DEMAND ISCHEMIA not NSTEMI Normal LVEDP ~11 mmHg RECOMMENDATIONS   Anticipated discharge date to be determined.   Treat underlying cause-COPD exacerbation   Recommend Aspirin 81mg  daily for moderate CAD. Bryan Lemma, MD  \ECHOCARDIOGRAM COMPLETE  Result Date: 04/07/2023    ECHOCARDIOGRAM  REPORT   Patient Name:   Marie Johnson Date of Exam: 04/07/2023 Medical Rec #:  [id number]      Height:       67.0 in Accession #:    [id number]     Weight:       128.4 lb Date of Birth:  09/28/1950      BSA:          1.675 m Patient Age:    71 years       BP:           138/73 mmHg Patient Gender: F              HR:           75 bpm. Exam Location:  Inpatient Procedure: 2D Echo, Color Doppler and Cardiac Doppler Indications:    Elevated Troponin  History:        Patient has prior history of Echocardiogram examinations, most                 recent 03/10/2020. CAD; COPD.  Sonographer:    Darlys Gales Referring Phys: [phone] JARED M GARDNER IMPRESSIONS  1. Small region of apical hypokinesis/akinesis. Basal inferior hypokinesis. . Left ventricular ejection fraction, by estimation, is 55 to 60%. The left ventricle has normal function.  2. Right ventricular systolic function is normal. The right ventricular size is normal.  3. Left atrial size was mildly dilated.  4. The mitral valve is normal in structure. Mild mitral valve regurgitation.  5. The aortic valve is  tricuspid. Aortic valve regurgitation is not visualized. Aortic valve sclerosis is present, with no evidence of aortic valve stenosis. FINDINGS  Left Ventricle: Small region of apical hypokinesis/akinesis. Basal inferior hypokinesis. Left ventricular ejection fraction, by estimation, is 55 to 60%. The left ventricle has normal function. The left ventricular internal cavity size was normal in size. There is no left ventricular hypertrophy. Right Ventricle: The right ventricular size is normal. Right vetricular wall thickness was not assessed. Right ventricular systolic function is normal. Left Atrium: Left atrial size was mildly dilated. Right Atrium: Right atrial size was normal in size. Pericardium: Trivial pericardial effusion is present. Mitral Valve: The mitral valve is normal in structure. There is mild thickening of the mitral valve leaflet(s). Mild mitral annular calcification. Mild mitral valve regurgitation. MV peak gradient, 5.3 mmHg. The mean mitral valve gradient is 3.0 mmHg. Tricuspid Valve: The tricuspid valve is normal in structure. Tricuspid valve regurgitation is trivial. Aortic Valve: The aortic valve is tricuspid. Aortic valve regurgitation is not visualized. Aortic valve sclerosis is present, with no evidence of aortic valve stenosis. Aortic valve mean gradient measures 3.0 mmHg. Aortic valve peak gradient measures 5.5  mmHg. Aortic valve area, by VTI measures 3.00 cm. Pulmonic Valve: The pulmonic valve was not well visualized. Pulmonic valve regurgitation is not visualized. Aorta: The aortic root is normal in size and structure. IAS/Shunts: No atrial level shunt detected by color flow Doppler.  LEFT VENTRICLE PLAX 2D LVIDd:         4.50 cm   Diastology LVIDs:         2.80 cm   LV e' medial:    5.33 cm/s LV PW:         0.80 cm   LV E/e' medial:  19.3 LV IVS:        0.80 cm   LV e' lateral:   4.68 cm/s LVOT diam:     1.80 cm   LV  E/e' lateral: 22.0 LV SV:         67 LV SV Index:   40 LVOT Area:      2.54 cm  RIGHT VENTRICLE RV S prime:     9.25 cm/s TAPSE (M-mode): 1.9 cm LEFT ATRIUM             Index        RIGHT ATRIUM          Index LA Vol (A2C):   53.9 ml 32.18 ml/m  RA Area:     9.48 cm LA Vol (A4C):   54.9 ml 32.77 ml/m  RA Volume:   15.90 ml 9.49 ml/m LA Biplane Vol: 56.6 ml 33.79 ml/m  AORTIC VALVE AV Area (Vmax):    2.44 cm AV Area (Vmean):   2.48 cm AV Area (VTI):     3.00 cm AV Vmax:           117.00 cm/s AV Vmean:          83.600 cm/s AV VTI:            0.223 m AV Peak Grad:      5.5 mmHg AV Mean Grad:      3.0 mmHg LVOT Vmax:         112.00 cm/s LVOT Vmean:        81.500 cm/s LVOT VTI:          0.263 m LVOT/AV VTI ratio: 1.18  AORTA Ao Root diam: 2.30 cm MITRAL VALVE MV Area (PHT): 3.61 cm     SHUNTS MV Area VTI:   2.60 cm     Systemic VTI:  0.26 m MV Peak grad:  5.3 mmHg     Systemic Diam: 1.80 cm MV Mean grad:  3.0 mmHg MV Vmax:       1.15 m/s MV Vmean:      85.5 cm/s MV Decel Time: 210 msec MV E velocity: 103.00 cm/s MV A velocity: 109.00 cm/s MV E/A ratio:  0.94 Dietrich Pates MD Electronically signed by Dietrich Pates MD Signature Date/Time: 04/07/2023/3:27:13 PM    Final         Scheduled Meds:  aspirin EC  81 mg Oral Daily   atorvastatin  80 mg Oral QHS   budesonide (PULMICORT) nebulizer solution  0.25 mg Nebulization BID   clopidogrel  75 mg Oral QHS   furosemide  40 mg Oral Daily   heparin  5,000 Units Subcutaneous Q8H   ipratropium-albuterol  3 mL Nebulization Q6H   methylPREDNISolone (SOLU-MEDROL) injection  40 mg Intravenous Q12H   pantoprazole  40 mg Oral Daily   sodium chloride flush  3 mL Intravenous Q12H   spironolactone  25 mg Oral Daily   verapamil  240 mg Oral Daily   Continuous Infusions:  sodium chloride       LOS: 2 days    Time spent: 35 minutes    Vaishnav Demartin Hoover Brunette, DO [facility]  If 7PM-7AM, please contact night-coverage [email or web address] 04/09/2023, 11:09 AM

## 2023-04-09 NOTE — Progress Notes (Signed)
Mobility Specialist Progress Note:   04/09/23 1052  Oxygen Therapy  SpO2 92 %  O2 Device Nasal Cannula  O2 Flow Rate (L/min) 3 L/min  Mobility  Activity Ambulated with assistance in hallway  Level of Assistance Contact guard assist, steadying assist  Assistive Device Front wheel walker  Distance Ambulated (ft) 150 ft  Activity Response Tolerated fair  Mobility Referral Yes  $Mobility charge 1 Mobility  Mobility Specialist Start Time (ACUTE ONLY) 1030  Mobility Specialist Stop Time (ACUTE ONLY) 1049  Mobility Specialist Time Calculation (min) (ACUTE ONLY) 19 min    Pre Mobility: 96 HR,  164/75 (102) BP,  92% SpO2 During Mobility: 109 HR,  93% SpO2 Post Mobility:  102 HR,  190/89 (115) BP,  92% SpO2  Pt received on EOB, agreeable to mobility. C/o of SOB throughout session, requiring x2 standing rest breaks. SpO2 at 92%-93% throughout. BP elevated before and after session, RN notified. Pt left on EOB with call bell and all needs met.  [place]ante Earlene Plater Mobility Specialist Please contact via Special educational needs teacher or Rehab office at [phone]

## 2023-04-09 NOTE — Progress Notes (Signed)
Progress Note  Patient Name: Marie Johnson Date of Encounter: 04/09/2023  Primary Cardiologist: Meriam Sprague, MD   Subjective   Patient seen and examined at her bedside. Post LHC yesterday.  Denies any symptoms or chest pain.  Still is short of breath and wheezing.  Inpatient Medications    Scheduled Meds:  aspirin EC  81 mg Oral Daily   atorvastatin  80 mg Oral QHS   budesonide (PULMICORT) nebulizer solution  0.25 mg Nebulization BID   clopidogrel  75 mg Oral QHS   furosemide  40 mg Oral Daily   heparin  5,000 Units Subcutaneous Q8H   methylPREDNISolone (SOLU-MEDROL) injection  40 mg Intravenous Q12H   pantoprazole  40 mg Oral Daily   sodium chloride flush  3 mL Intravenous Q12H   spironolactone  25 mg Oral Daily   verapamil  240 mg Oral Daily   Continuous Infusions:  sodium chloride     PRN Meds: sodium chloride, acetaminophen, albuterol, albuterol, nitroGLYCERIN, ondansetron (ZOFRAN) IV, oxyCODONE, sodium chloride flush   Vital Signs    Vitals:   04/09/23 0000 04/09/23 0300 04/09/23 0729 04/09/23 0751  BP: 139/67 (!) 146/80 (!) 153/71   Pulse: 94 75 81   Resp: (!) 22 19 18    Temp:  98.7 F (37.1 C) 99.1 F (37.3 C)   TempSrc:  Oral Oral   SpO2: 90% 98% 95% 94%  Weight:        Intake/Output Summary (Last 24 hours) at 04/09/2023 0827 Last data filed at 04/09/2023 0242 Gross per 24 hour  Intake 1068.75 ml  Output 150 ml  Net 918.75 ml   Filed Weights   04/08/23 1007  Weight: 55.7 kg    Telemetry    Sinus rhythm - Personally Reviewed  ECG    none - Personally Reviewed  Physical Exam   General: Comfortable Head: Atraumatic, normal size  Eyes: PEERLA, EOMI  Neck: Supple, normal JVD Cardiac: Normal S1, S2; RRR; no murmurs, rubs, or gallops Lungs: Clear to auscultation bilaterally Abd: Soft, nontender, no hepatomegaly  Ext: warm, no edema Musculoskeletal: No deformities, BUE and BLE strength normal and equal Skin: Warm and dry, no  rashes   Neuro: Alert and oriented to person, place, time, and situation, CNII-XII grossly intact, no focal deficits  Psych: Normal mood and affect   Labs    Chemistry Recent Labs  Lab 04/06/23 1755 04/07/23 1226 04/08/23 0655  NA 137 131* 136  K 3.1* 4.2 4.5  CL 100 97* 105  CO2 26 27 25   GLUCOSE 100* 92 175*  BUN 10 12 13   CREATININE 0.68 0.68 0.76  CALCIUM 8.6* 8.4* 8.7*  PROT 6.4*  --   --   ALBUMIN 3.1* 2.8* 3.0*  AST 23  --   --   ALT 20  --   --   ALKPHOS 47  --   --   BILITOT 0.7  --   --   GFRNONAA >60 >60 >60  ANIONGAP 11 7 6      Hematology Recent Labs  Lab 04/06/23 1755 04/08/23 0655  WBC 10.5 7.0  RBC 3.94 4.08  HGB 12.9 13.2  HCT 38.3 40.1  MCV 97.2 98.3  MCH 32.7 32.4  MCHC 33.7 32.9  RDW 21.2* 21.6*  PLT 244 243    Cardiac EnzymesNo results for input(s): "TROPONINI" in the last 168 hours. No results for input(s): "TROPIPOC" in the last 168 hours.   BNP Recent Labs  Lab 04/06/23 1756  BNP 265.9*     DDimer  Recent Labs  Lab 04/06/23 1755  DDIMER 1.22*     Radiology    CARDIAC CATHETERIZATION  Result Date: 04/08/2023   Dist LM to Mid LAD lesion is 15% stenosed.  Calcified   Mid LAD lesion is 40% stenosed.  Calcified   1st Mrg lesion is 5% stenosed.  Calcified   LV end diastolic pressure is normal.   Recommend Aspirin 81mg  daily for moderate CAD.  POST-OPERATIVE DIAGNOSIS:  Diffuse Coronary Calcification with only mild-moderate non-obstructive disease - NO Culprit Lesion to explain + Troponin -- DEMAND ISCHEMIA not NSTEMI Normal LVEDP ~11 mmHg RECOMMENDATIONS   Anticipated discharge date to be determined.   Treat underlying cause-COPD exacerbation   Recommend Aspirin 81mg  daily for moderate CAD. Bryan Lemma, MD\  ECHOCARDIOGRAM COMPLETE  Result Date: 04/07/2023    ECHOCARDIOGRAM REPORT   Patient Name:   Marie Johnson Date of Exam: 04/07/2023 Medical Rec #:  [id number]      Height:       67.0 in Accession #:    [id number]     Weight:        128.4 lb Date of Birth:  1950-10-13      BSA:          1.675 m Patient Age:    71 years       BP:           138/73 mmHg Patient Gender: F              HR:           75 bpm. Exam Location:  Inpatient Procedure: 2D Echo, Color Doppler and Cardiac Doppler Indications:    Elevated Troponin  History:        Patient has prior history of Echocardiogram examinations, most                 recent 03/10/2020. CAD; COPD.  Sonographer:    Darlys Gales Referring Phys: [phone] JARED M GARDNER IMPRESSIONS  1. Small region of apical hypokinesis/akinesis. Basal inferior hypokinesis. . Left ventricular ejection fraction, by estimation, is 55 to 60%. The left ventricle has normal function.  2. Right ventricular systolic function is normal. The right ventricular size is normal.  3. Left atrial size was mildly dilated.  4. The mitral valve is normal in structure. Mild mitral valve regurgitation.  5. The aortic valve is tricuspid. Aortic valve regurgitation is not visualized. Aortic valve sclerosis is present, with no evidence of aortic valve stenosis. FINDINGS  Left Ventricle: Small region of apical hypokinesis/akinesis. Basal inferior hypokinesis. Left ventricular ejection fraction, by estimation, is 55 to 60%. The left ventricle has normal function. The left ventricular internal cavity size was normal in size. There is no left ventricular hypertrophy. Right Ventricle: The right ventricular size is normal. Right vetricular wall thickness was not assessed. Right ventricular systolic function is normal. Left Atrium: Left atrial size was mildly dilated. Right Atrium: Right atrial size was normal in size. Pericardium: Trivial pericardial effusion is present. Mitral Valve: The mitral valve is normal in structure. There is mild thickening of the mitral valve leaflet(s). Mild mitral annular calcification. Mild mitral valve regurgitation. MV peak gradient, 5.3 mmHg. The mean mitral valve gradient is 3.0 mmHg. Tricuspid Valve: The tricuspid  valve is normal in structure. Tricuspid valve regurgitation is trivial. Aortic Valve: The aortic valve is tricuspid. Aortic valve regurgitation is not visualized. Aortic valve sclerosis is present, with no evidence of aortic valve  stenosis. Aortic valve mean gradient measures 3.0 mmHg. Aortic valve peak gradient measures 5.5  mmHg. Aortic valve area, by VTI measures 3.00 cm. Pulmonic Valve: The pulmonic valve was not well visualized. Pulmonic valve regurgitation is not visualized. Aorta: The aortic root is normal in size and structure. IAS/Shunts: No atrial level shunt detected by color flow Doppler.  LEFT VENTRICLE PLAX 2D LVIDd:         4.50 cm   Diastology LVIDs:         2.80 cm   LV e' medial:    5.33 cm/s LV PW:         0.80 cm   LV E/e' medial:  19.3 LV IVS:        0.80 cm   LV e' lateral:   4.68 cm/s LVOT diam:     1.80 cm   LV E/e' lateral: 22.0 LV SV:         67 LV SV Index:   40 LVOT Area:     2.54 cm  RIGHT VENTRICLE RV S prime:     9.25 cm/s TAPSE (M-mode): 1.9 cm LEFT ATRIUM             Index        RIGHT ATRIUM          Index LA Vol (A2C):   53.9 ml 32.18 ml/m  RA Area:     9.48 cm LA Vol (A4C):   54.9 ml 32.77 ml/m  RA Volume:   15.90 ml 9.49 ml/m LA Biplane Vol: 56.6 ml 33.79 ml/m  AORTIC VALVE AV Area (Vmax):    2.44 cm AV Area (Vmean):   2.48 cm AV Area (VTI):     3.00 cm AV Vmax:           117.00 cm/s AV Vmean:          83.600 cm/s AV VTI:            0.223 m AV Peak Grad:      5.5 mmHg AV Mean Grad:      3.0 mmHg LVOT Vmax:         112.00 cm/s LVOT Vmean:        81.500 cm/s LVOT VTI:          0.263 m LVOT/AV VTI ratio: 1.18  AORTA Ao Root diam: 2.30 cm MITRAL VALVE MV Area (PHT): 3.61 cm     SHUNTS MV Area VTI:   2.60 cm     Systemic VTI:  0.26 m MV Peak grad:  5.3 mmHg     Systemic Diam: 1.80 cm MV Mean grad:  3.0 mmHg MV Vmax:       1.15 m/s MV Vmean:      85.5 cm/s MV Decel Time: 210 msec MV E velocity: 103.00 cm/s MV A velocity: 109.00 cm/s MV E/A ratio:  0.94 Dietrich Pates MD  Electronically signed by Dietrich Pates MD Signature Date/Time: 04/07/2023/3:27:13 PM    Final     Cardiac Studies   TTE 04/04/2023 IMPRESSIONS     1. Small region of apical hypokinesis/akinesis. Basal inferior  hypokinesis. . Left ventricular ejection fraction, by estimation, is 55 to  60%. The left ventricle has normal function.   2. Right ventricular systolic function is normal. The right ventricular  size is normal.   3. Left atrial size was mildly dilated.   4. The mitral valve is normal in structure. Mild mitral valve  regurgitation.   5. The aortic valve  is tricuspid. Aortic valve regurgitation is not  visualized. Aortic valve sclerosis is present, with no evidence of aortic  valve stenosis.   FINDINGS   Left Ventricle: Small region of apical hypokinesis/akinesis. Basal  inferior hypokinesis. Left ventricular ejection fraction, by estimation,  is 55 to 60%. The left ventricle has normal function. The left ventricular  internal cavity size was normal in  size. There is no left ventricular hypertrophy.   Right Ventricle: The right ventricular size is normal. Right vetricular  wall thickness was not assessed. Right ventricular systolic function is  normal.   Left Atrium: Left atrial size was mildly dilated.   Right Atrium: Right atrial size was normal in size.   Pericardium: Trivial pericardial effusion is present.   Mitral Valve: The mitral valve is normal in structure. There is mild  thickening of the mitral valve leaflet(s). Mild mitral annular  calcification. Mild mitral valve regurgitation. MV peak gradient, 5.3  mmHg. The mean mitral valve gradient is 3.0 mmHg.   Tricuspid Valve: The tricuspid valve is normal in structure. Tricuspid  valve regurgitation is trivial.   Aortic Valve: The aortic valve is tricuspid. Aortic valve regurgitation is  not visualized. Aortic valve sclerosis is present, with no evidence of  aortic valve stenosis. Aortic valve mean gradient  measures 3.0 mmHg.  Aortic valve peak gradient measures 5.5   mmHg. Aortic valve area, by VTI measures 3.00 cm.   Pulmonic Valve: The pulmonic valve was not well visualized. Pulmonic valve  regurgitation is not visualized.   Aorta: The aortic root is normal in size and structure.   IAS/Shunts: No atrial level shunt detected by color flow Doppler.   Patient Profile     72 y.o. female with a history of congestive heart failure, nonobstructive CAD, peripheral artery disease status post lower extremity bypass, COPD, active smoker presented for shortness of breath found to have elevated troponin.  Assessment & Plan    NSTEMI HTN  COPD Hyperlipidemia PAD  Hospitalist left heart catheterization yesterday, noted moderate coronary artery disease no indication for PCI at this time.  Continue aspirin and atorvastatin.   COPD/tobacco use -her COPD exacerbation may be driving her symptoms.  Smoking cessation advised.  Patient not amenable at this time to quit smoking.  Not on home oxygen and currently on oxygen will defer to the primary team for this.  Hyperlipidemia - continue with current statin medication.  Peripheral artery disease-continue aspirin and Plavix.  She is also on a statin continue this.     [facility] will sign off.   Medication Recommendations: Aspirin 81 mg daily, Plavix 75 mg daily, Lasix 40 mg daily, atorvastatin 80 mg daily, spironolactone 25 mg daily, verapamil to 40 mg daily Other recommendations (labs, testing, etc): None Follow up as an outpatient: Routine    Signed, Maybelline Kolarik, DO  04/09/2023, 8:27 AM

## 2023-04-09 NOTE — Plan of Care (Signed)

## 2023-04-10 DIAGNOSIS — I2489 Other forms of acute ischemic heart disease: Secondary | ICD-10-CM | POA: Diagnosis not present

## 2023-04-10 LAB — BASIC METABOLIC PANEL
Anion gap: 8 (ref 5–15)
BUN: 28 mg/dL — ABNORMAL HIGH (ref 8–23)
CO2: 28 mmol/L (ref 22–32)
Calcium: 8.5 mg/dL — ABNORMAL LOW (ref 8.9–10.3)
Chloride: 99 mmol/L (ref 98–111)
Creatinine, Ser: 1.16 mg/dL — ABNORMAL HIGH (ref 0.44–1.00)
GFR, Estimated: 50 mL/min — ABNORMAL LOW (ref >60)
Glucose, Bld: 181 mg/dL — ABNORMAL HIGH (ref 70–99)
Potassium: 4 mmol/L (ref 3.5–5.1)
Sodium: 135 mmol/L (ref 135–145)

## 2023-04-10 LAB — CBC
HCT: 36.8 % (ref 36.0–46.0)
Hemoglobin: 12.3 g/dL (ref 12.0–15.0)
MCH: 33.2 pg (ref 26.0–34.0)
MCHC: 33.4 g/dL (ref 30.0–36.0)
MCV: 99.2 fL (ref 80.0–100.0)
Platelets: 250 10*3/uL (ref 150–400)
RBC: 3.71 MIL/uL — ABNORMAL LOW (ref 3.87–5.11)
RDW: 21.3 % — ABNORMAL HIGH (ref 11.5–15.5)
WBC: 7.1 10*3/uL (ref 4.0–10.5)
nRBC: 0.3 % — ABNORMAL HIGH (ref 0.0–0.2)

## 2023-04-10 LAB — MAGNESIUM: Magnesium: 2.1 mg/dL (ref 1.7–2.4)

## 2023-04-10 MED ORDER — AZITHROMYCIN 500 MG PO TABS
500.0000 mg | ORAL_TABLET | Freq: Every day | ORAL | Status: DC
  Administered 2023-04-10 – 2023-04-11 (×2): 500 mg via ORAL
  Filled 2023-04-10 (×2): qty 1

## 2023-04-10 NOTE — Plan of Care (Signed)

## 2023-04-10 NOTE — Care Management Important Message (Signed)
Important Message  Patient Details  Name: Marie Johnson MRN: [id number] Date of Birth: 1951-05-25   Medicare Important Message Given:  Yes     Dorena Bodo 04/10/2023, 2:28 PM

## 2023-04-10 NOTE — TOC Initial Note (Signed)
Transition of Care [facility]) - Initial/Assessment Note    Patient Details  Name: Marie Johnson MRN: [id number] Date of Birth: May 12, 1951  Transition of Care [facility]) CM/SW Contact:    Harriet Masson, RN Phone Number: 04/10/2023, 3:07 PM  Clinical Narrative:                  Spoke to patient regarding transition needs.  Orders for home 02. Patient has no preference on DME agency.  Mitch with [employer] notified of home 02.   Expected Discharge Plan: Home/Self Care Barriers to Discharge: Continued Medical Work up   Patient Goals and CMS Choice Patient states their goals for this hospitalization and ongoing recovery are:: return home CMS Medicare.gov Compare Post Acute Care list provided to:: Patient Choice offered to / list presented to : Patient      Expected Discharge Plan and Services   Discharge Planning Services: CM Consult Post Acute Care Choice: Durable Medical Equipment Living arrangements for the past 2 months: Single Family Home                 DME Arranged: Oxygen DME Agency: [employer] Date DME Agency Contacted: 04/10/23 Time DME Agency Contacted: [phone] Representative spoke with at DME Agency: Mitch            Prior Living Arrangements/Services Living arrangements for the past 2 months: Single Family Home   Patient language and need for interpreter reviewed:: Yes Do you feel safe going back to the place where you live?: Yes      Need for Family Participation in Patient Care: Yes (Comment) Care giver support system in place?: Yes (comment) Current home services: DME (walker) Criminal Activity/Legal Involvement Pertinent to Current Situation/Hospitalization: No - Comment as needed  Activities of Daily Living      Permission Sought/Granted                  Emotional Assessment   Attitude/Demeanor/Rapport: Gracious Affect (typically observed): Accepting Orientation: : Oriented to Self, Oriented to Place, Oriented to  Time, Oriented to  Situation Alcohol / Substance Use: Not Applicable Psych Involvement: No (comment)  Admission diagnosis:  NSTEMI (non-ST elevated myocardial infarction) (HCC) [I21.4] 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] History of COPD [Z87.09] Dyspnea, unspecified type [R06.00] Patient Active Problem List   Diagnosis Date Noted   Demand ischemia 04/06/2023   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   Critical limb ischemia of left lower extremity (HCC) 07/26/2022   Thoracic aortic aneurysm (HCC) 04/22/2022   PAD (peripheral artery disease) (HCC) 03/18/2020   Critical limb ischemia with history of revascularization of same extremity (HCC) 03/08/2020   Chronic diastolic CHF (congestive heart failure) (HCC) 07/14/2018   Acute sinusitis 12/05/2016   Premature ventricular contraction 03/26/2016   Chest pain 02/22/2016   Palpitations 02/22/2016   CAD (coronary artery disease) 07/17/2013   PVD (peripheral vascular disease) (HCC) 07/17/2013   Aortic atherosclerosis (HCC) 07/17/2013   AAA (abdominal aortic aneurysm) (HCC) 07/17/2013   Tobacco abuse 07/17/2013   Hypertension 07/17/2013   Hyperlipidemia 07/17/2013   COPD (chronic obstructive pulmonary disease) (HCC) 07/17/2013   GERD (gastroesophageal reflux disease) 07/17/2013   PCP:  Ailene Ravel, MD Pharmacy:   [facility] - [place], [place] - [address] [place] [address] Phone: [phone] Fax: [phone]  [id number] - [place], [place] - [address] AT [facility] [address] [place] [address] Phone: [phone] Fax: [phone]     Social  Determinants of Health (SDOH) Social History: SDOH Screenings   Food Insecurity: Low Risk  (12/31/2022)   Received from [facility], [facility]  Transportation Needs: No Transportation Needs (12/31/2022)   [facility]  Utilities: Low Risk  (12/31/2022)   [facility]  Tobacco Use: Medium Risk (04/07/2023)   SDOH Interventions:     Readmission Risk Interventions     No data to display

## 2023-04-10 NOTE — Progress Notes (Signed)
SATURATION QUALIFICATIONS: (This note is used to comply with regulatory documentation for home oxygen)  Patient Saturations on Room Air at Rest =90%  Patient Saturations on Room Air while Ambulating = 88%  Patient Saturations on 2 Liters of oxygen while Ambulating = 95%  Please briefly explain why patient needs home oxygen:  Patient desats at ambulation

## 2023-04-10 NOTE — Progress Notes (Addendum)
Nurse requested Mobility Specialist to perform oxygen saturation test with pt which includes removing pt from oxygen both at rest and while ambulating.  Below are the results from that testing.     Patient Saturations on Room Air at Rest = spO2 93%  Patient Saturations on Room Air while Ambulating = sp02 88% .  Rested and performed pursed lip breathing for 1 minute with sp02 at 91%.  Patient Saturations on 2 Liters of oxygen while Ambulating = sp02 95%  At end of testing pt left in room on 3  Liters of oxygen.  Reported results to nurse.   Agree with above

## 2023-04-10 NOTE — Progress Notes (Signed)
Mobility Specialist Progress Note:   04/10/23 1132  Mobility  Activity Ambulated with assistance in hallway  Level of Assistance Contact guard assist, steadying assist  Assistive Device Front wheel walker  Distance Ambulated (ft) 240 ft  Activity Response Tolerated well  Mobility Referral Yes  $Mobility charge 1 Mobility  Mobility Specialist Start Time (ACUTE ONLY) 1055  Mobility Specialist Stop Time (ACUTE ONLY) 1110  Mobility Specialist Time Calculation (min) (ACUTE ONLY) 15 min    Pre Mobility: 121/58 BP , 97% SpO2 3 L During Mobility: 87 HR , 88%- 95% SpO2 RA - 2 L Post Mobility: 80 HR , 97% SpO2 3 L  Pt received in chair, agreeable to mobility. C/o SOB during ambulation. Pt desat to 88% on RA. C/o not feeling enough oxygen when inhaling requiring 2L to increase levels above 90%. Pursed lip encouraged. Pt returned to chair with call bell in hand and all needs met.  Leory Plowman  Mobility Specialist Please contact via [employer] office at [phone]

## 2023-04-10 NOTE — Progress Notes (Signed)
PROGRESS NOTE    Marie Johnson  QMV:[id number] DOB: August 03, 1951 DOA: 04/06/2023 PCP: Ailene Ravel, MD    Brief Narrative:  Patient is a 72 year old female past medical history significant for COPD, ongoing smoker,  diastolic congestive heart failure, peripheral artery disease s/p bypass, nonocclusive coronary artery disease and recent diagnosis of laryngeal cancer. Patient presented with 3-day history of difficulty breathing and left-sided chest pain, without any radiation. Workup done on presentation revealed troponin of 196 , cardiac BNP of 365. 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  Underwent left heart cath, no obstructive coronary artery disease. Remains in the hospital due to COPD exacerbation, persistent symptoms.   Assessment & Plan:   COPD with acute exacerbation: Continue with aggressive bronchodilator therapy, IV steroids, inhalational steroids, scheduled and as needed bronchodilators, deep breathing exercises, incentive spirometry, chest physiotherapy and mobility with PT OT. Not on antibiotics.  Will treat with 5 days of azithromycin. Supplemental oxygen to keep saturations more than 90%.  Will evaluate for home oxygen need.  Chest pain, demand ischemia: Myocardial injury secondary to hypoxemia. CTA chest negative for PE. Treated with heparin infusion, follow cardiology. Underwent cardiac cath, moderate coronary artery disease, no indication for PCI.  On aspirin and statin.  Current diastolic congestive heart failure: Back on Lasix.  Stable now.  Patient's hypertension: Blood pressure stable on selectin, verapamil.  Laryngeal cancer: Recently diagnosed with laryngeal cancer.  Scheduled for CT scan of the  soft tissue of the neck and CT scan of the chest with contrast.  CT angiogram on 7/20 was without any obvious evidence of metastatic disease.  She will need dedicated CT scan of the chest.  If she remains in the hospital will order tomorrow.  Mobilize.  Evaluate for home oxygen need.  Anticipate home tomorrow.  Smoker: Continues to smoke.  She has COPD, coronary artery disease, peripheral vascular disease and all atherosclerotic damage from smoking.  She is determined to stop smoking now.  DVT prophylaxis: heparin injection 5,000 Units Start: 04/08/23 2200   Code Status: Full code Family Communication: None at bedside Disposition Plan: Status is: Inpatient Remains inpatient appropriate because: Still significant wheezing     Consultants:  Cardiology  Procedures:  Cardiac cath  Antimicrobials:  Azithromycin 7/24---   Subjective: Patient seen and examined.  Denies any chest pain but she is still significantly wheezing.  Increased sputum production.  Unable to tolerate weaning off of oxygen.  Objective: Vitals:   04/10/23 0339 04/10/23 0753 04/10/23 0756 04/10/23 0802  BP: 135/67   139/68  Pulse: 67   77  Resp: 18   (!) 21  Temp: 97.6 F (36.4 C)   97.7 F (36.5 C)  TempSrc: Oral   Oral  SpO2: 97% 95% 95% 94%  Weight:        Intake/Output Summary (Last 24 hours) at 04/10/2023 1035 Last data filed at 04/10/2023 0500 Gross per 24 hour  Intake 243 ml  Output 500 ml  Net -257 ml   Filed Weights   04/08/23 1007  Weight: 55.7 kg    Examination:  General: Chronically sick looking.  Frail debilitated.  Not in any distress. Cardiovascular: S1-S2 normal.  Regular rate rhythm. Respiratory: Bilateral expiratory wheezes.  Conducted upper airway sounds.  Able to talk in complete sentences.  Currently on 2 L oxygen.  Nagging cough on breathing. Gastrointestinal: Soft and nontender.  Bowel sound present. Ext: No swelling or edema.  No cyanosis. Neuro: Alert awake and  oriented.  No focal neurodeficit.   Data Reviewed: I have personally reviewed following labs and imaging studies  CBC: Recent Labs  Lab 04/06/23 1755 04/08/23 0655 04/10/23 0339  WBC 10.5 7.0 7.1  HGB 12.9 13.2 12.3  HCT 38.3 40.1 36.8  MCV 97.2 98.3 99.2  PLT 244 243 250   Basic Metabolic Panel: Recent Labs  Lab 04/06/23 1755 04/07/23 1226 04/08/23 0655 04/10/23 0339  NA 137 131* 136 135  K 3.1* 4.2 4.5 4.0  CL 100 97* 105 99  CO2 26 27 25 28   GLUCOSE 100* 92 175* 181*  BUN 10 12 13  28*  CREATININE 0.68 0.68 0.76 1.16*  CALCIUM 8.6* 8.4* 8.7* 8.5*  MG  --  1.8 2.0 2.1  PHOS  --  3.0 3.4  --    GFR: Estimated Creatinine Clearance: 38 mL/min (A) (by C-G formula based on SCr of 1.16 mg/dL (H)). Liver Function Tests: Recent Labs  Lab 04/06/23 1755 04/07/23 1226 04/08/23 0655  AST 23  --   --   ALT 20  --   --   ALKPHOS 47  --   --   BILITOT 0.7  --   --   PROT 6.4*  --   --   ALBUMIN 3.1* 2.8* 3.0*   No results for input(s): "LIPASE", "AMYLASE" in the last 168 hours. No results for input(s): "AMMONIA" in the last 168 hours. Coagulation Profile: No results for input(s): "INR", "PROTIME" in the last 168 hours. Cardiac Enzymes: No results for input(s): "CKTOTAL", "CKMB", "CKMBINDEX", "TROPONINI" in the last 168 hours. BNP (last 3 results) No results for input(s): "PROBNP" in the last 8760 hours. HbA1C: No results for input(s): "HGBA1C" in the last 72 hours. CBG: No results for input(s): "GLUCAP" in the last 168 hours. Lipid Profile: No results for input(s): "CHOL", "HDL", "LDLCALC", "TRIG", "CHOLHDL", "LDLDIRECT" in the last 72 hours. Thyroid Function Tests: No results for input(s): "TSH", "T4TOTAL", "FREET4", "T3FREE", "THYROIDAB" in the last 72 hours. Anemia Panel: No results for input(s): "VITAMINB12", "FOLATE", "FERRITIN", "TIBC", "IRON", "RETICCTPCT" in the last 72 hours. Sepsis Labs: Recent Labs  Lab 04/06/23 2314 04/06/23 2316  PROCALCITON  --   <0.10  LATICACIDVEN 1.0  --     Recent Results (from the past 240 hour(s))  Resp panel by RT-PCR (RSV, Flu A&B, Covid) Anterior Nasal Swab     Status: None   Collection Time: 04/06/23  5:55 PM   Specimen: Anterior Nasal Swab  Result Value Ref Range Status   SARS Coronavirus 2 by RT PCR NEGATIVE NEGATIVE Final   Influenza A by PCR NEGATIVE NEGATIVE Final   Influenza B by PCR NEGATIVE NEGATIVE Final    Comment: (NOTE) The Xpert Xpress SARS-CoV-2/FLU/RSV plus assay is intended as an aid in the diagnosis of influenza from Nasopharyngeal swab specimens and should not be used as a sole basis for treatment. Nasal washings and aspirates are unacceptable for Xpert Xpress SARS-CoV-2/FLU/RSV testing.  Fact Sheet for Patients: BloggerCourse.com  Fact Sheet for Healthcare Providers: SeriousBroker.it  This test is not yet approved or cleared by the [place] FDA and has been authorized for detection and/or diagnosis of SARS-CoV-2 by FDA under an Emergency Use Authorization (EUA). This EUA will remain in effect (meaning this test can be used) for the duration of the COVID-19 declaration  under Section 564(b)(1) of the Act, 21 U.S.C. section 360bbb-3(b)(1), unless the authorization is terminated or revoked.     Resp Syncytial Virus by PCR NEGATIVE NEGATIVE Final    Comment: (NOTE) Fact Sheet for Patients: BloggerCourse.com  Fact Sheet for Healthcare Providers: SeriousBroker.it  This test is not yet approved or cleared by the [place] FDA and has been authorized for detection and/or diagnosis of SARS-CoV-2 by FDA under an Emergency Use Authorization (EUA). This EUA will remain in effect (meaning this test can be used) for the duration of the COVID-19 declaration under Section 564(b)(1) of the Act, 21 U.S.C. section 360bbb-3(b)(1), unless the authorization is terminated  or revoked.  Performed at [facility], [address], [place], [place] [address]          Radiology Studies: CARDIAC CATHETERIZATION  Result Date: 04/08/2023   Dist LM to Mid LAD lesion is 15% stenosed.  Calcified   Mid LAD lesion is 40% stenosed.  Calcified   1st Mrg lesion is 5% stenosed.  Calcified   LV end diastolic pressure is normal.   Recommend Aspirin 81mg  daily for moderate CAD.  POST-OPERATIVE DIAGNOSIS:  Diffuse Coronary Calcification with only mild-moderate non-obstructive disease - NO Culprit Lesion to explain + Troponin -- DEMAND ISCHEMIA not NSTEMI Normal LVEDP ~11 mmHg RECOMMENDATIONS   Anticipated discharge date to be determined.   Treat underlying cause-COPD exacerbation   Recommend Aspirin 81mg  daily for moderate CAD. Bryan Lemma, MD\       Scheduled Meds:  aspirin EC  81 mg Oral Daily   atorvastatin  80 mg Oral QHS   azithromycin  500 mg Oral Daily   budesonide (PULMICORT) nebulizer solution  0.25 mg Nebulization BID   clopidogrel  75 mg Oral QHS   furosemide  40 mg Oral Daily   heparin  5,000 Units Subcutaneous Q8H   ipratropium-albuterol  3 mL Nebulization BID   methylPREDNISolone (SOLU-MEDROL) injection  40 mg Intravenous Q12H   pantoprazole  40 mg Oral Daily   sodium chloride flush  3 mL Intravenous Q12H   spironolactone  25 mg Oral Daily   verapamil  240 mg Oral Daily   Continuous Infusions:  sodium chloride       LOS: 3 days    Time spent: 40 minutes    Dorcas Carrow, MD [facility]

## 2023-04-11 ENCOUNTER — Inpatient Hospital Stay (HOSPITAL_COMMUNITY): Payer: Medicare Other

## 2023-04-11 DIAGNOSIS — I2489 Other forms of acute ischemic heart disease: Secondary | ICD-10-CM | POA: Diagnosis not present

## 2023-04-11 LAB — BASIC METABOLIC PANEL
Anion gap: 11 (ref 5–15)
BUN: 23 mg/dL (ref 8–23)
CO2: 24 mmol/L (ref 22–32)
Calcium: 8.8 mg/dL — ABNORMAL LOW (ref 8.9–10.3)
Chloride: 98 mmol/L (ref 98–111)
Creatinine, Ser: 0.74 mg/dL (ref 0.44–1.00)
GFR, Estimated: >60 mL/min (ref >60)
Glucose, Bld: 122 mg/dL — ABNORMAL HIGH (ref 70–99)
Potassium: 4.7 mmol/L (ref 3.5–5.1)
Sodium: 133 mmol/L — ABNORMAL LOW (ref 135–145)

## 2023-04-11 MED ORDER — AZITHROMYCIN 500 MG PO TABS: 500.0000 mg | ORAL_TABLET | Freq: Every day | ORAL | 0 refills | Status: AC

## 2023-04-11 MED ORDER — IOHEXOL 350 MG/ML SOLN
75.0000 mL | Freq: Once | INTRAVENOUS | Status: AC | PRN
  Administered 2023-04-11: 75 mL via INTRAVENOUS

## 2023-04-11 MED ORDER — PREDNISONE 10 MG PO TABS: ORAL_TABLET | ORAL | 0 refills | Status: DC

## 2023-04-11 NOTE — Plan of Care (Signed)
  Problem: Education: Goal: Understanding of cardiac disease, CV risk reduction, and recovery process will improve Outcome: Adequate for Discharge Goal: Individualized Educational Video(s) Outcome: Adequate for Discharge   Problem: Activity: Goal: Ability to tolerate increased activity will improve Outcome: Adequate for Discharge   Problem: Cardiac: Goal: Ability to achieve and maintain adequate cardiovascular perfusion will improve Outcome: Adequate for Discharge   Problem: Health Behavior/Discharge Planning: Goal: Ability to safely manage health-related needs after discharge will improve Outcome: Adequate for Discharge   Problem: Education: Goal: Understanding of CV disease, CV risk reduction, and recovery process will improve Outcome: Adequate for Discharge Goal: Individualized Educational Video(s) Outcome: Adequate for Discharge   Problem: Activity: Goal: Ability to return to baseline activity level will improve Outcome: Adequate for Discharge   Problem: Cardiovascular: Goal: Ability to achieve and maintain adequate cardiovascular perfusion will improve Outcome: Adequate for Discharge Goal: Vascular access site(s) Level 0-1 will be maintained Outcome: Adequate for Discharge   Problem: Health Behavior/Discharge Planning: Goal: Ability to safely manage health-related needs after discharge will improve Outcome: Adequate for Discharge   Problem: Education: Goal: Knowledge of General Education information will improve Description: Including pain rating scale, medication(s)/side effects and non-pharmacologic comfort measures Outcome: Adequate for Discharge   Problem: Health Behavior/Discharge Planning: Goal: Ability to manage health-related needs will improve Outcome: Adequate for Discharge   Problem: Clinical Measurements: Goal: Ability to maintain clinical measurements within normal limits will improve Outcome: Adequate for Discharge Goal: Will remain free from  infection Outcome: Adequate for Discharge Goal: Diagnostic test results will improve Outcome: Adequate for Discharge Goal: Respiratory complications will improve Outcome: Adequate for Discharge Goal: Cardiovascular complication will be avoided Outcome: Adequate for Discharge   Problem: Activity: Goal: Risk for activity intolerance will decrease Outcome: Adequate for Discharge   Problem: Nutrition: Goal: Adequate nutrition will be maintained Outcome: Adequate for Discharge   Problem: Coping: Goal: Level of anxiety will decrease Outcome: Adequate for Discharge   Problem: Elimination: Goal: Will not experience complications related to bowel motility Outcome: Adequate for Discharge Goal: Will not experience complications related to urinary retention Outcome: Adequate for Discharge   Problem: Pain Managment: Goal: General experience of comfort will improve Outcome: Adequate for Discharge   Problem: Safety: Goal: Ability to remain free from injury will improve Outcome: Adequate for Discharge   Problem: Skin Integrity: Goal: Risk for impaired skin integrity will decrease Outcome: Adequate for Discharge   

## 2023-04-11 NOTE — Progress Notes (Signed)
Discharge instructions provided to patient. PIV removed prior to discharge. Awaiting patient driver to arrive.

## 2023-04-11 NOTE — Progress Notes (Signed)
Mobility Specialist Progress Note:   04/11/23 1015  Therapy Vitals  BP (!) 158/71  Oxygen Therapy  O2 Device Nasal Cannula  O2 Flow Rate (L/min) 4 L/min  Mobility  Activity Ambulated with assistance in hallway  Level of Assistance Contact guard assist, steadying assist  Assistive Device Front wheel walker  Distance Ambulated (ft) 340 ft  Activity Response Tolerated well  Mobility Referral Yes  $Mobility charge 1 Mobility  Mobility Specialist Start Time (ACUTE ONLY) 1015  Mobility Specialist Stop Time (ACUTE ONLY) 1039  Mobility Specialist Time Calculation (min) (ACUTE ONLY) 24 min    Pre Mobility: 90 HR,  158/71 (95) BP,  90% SpO2 During Mobility: 110 HR, 90% SpO2 Post Mobility:  102 HR,  92% SpO2  Pt received on EOB, agreeable to mobility. C/o of some fatigue, requiring x1 standing rest break. Otherwise asymptomatic. Pt left on EOB with call bell and all needs met.  [place] Earlene Plater Mobility Specialist Please contact via Special educational needs teacher or Rehab office at [phone]

## 2023-04-11 NOTE — Discharge Summary (Signed)
Physician Discharge Summary  Marie Johnson ZOX:[id number] DOB: 08/21/1951 DOA: 04/06/2023  PCP: Ailene Ravel, MD  Admit date: 04/06/2023 Discharge date: 04/11/2023  Admitted From: Home Disposition: Home  Recommendations for Outpatient Follow-up:  Follow up with PCP in 1-2 weeks Cardiology to schedule follow-up Follow-up with the ENT as previously planned  Home Health: None Equipment/Devices: Nasal cannula oxygen  Discharge Condition: Fair CODE STATUS: Full code Diet recommendation: Low-salt diet  Discharge summary: Patient is a 72 year old female past medical history significant for COPD, ongoing smoker,  diastolic congestive heart failure, peripheral artery disease s/p bypass, nonocclusive coronary artery disease and recent diagnosis of laryngeal cancer. Patient presented with 3-day history of difficulty breathing and left-sided chest pain, without any radiation. Workup done on presentation revealed troponin of 196 , cardiac BNP of 365. 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  Underwent left heart cath, no obstructive coronary artery disease. Remained in the hospital due to COPD exacerbation, persistent symptoms.     COPD with acute exacerbation: Patient was treated with aggressive bronchodilator therapy, IV steroids and recently steroids.  She was also started on azithromycin.  Remains overall poor pulmonary status however is stable today. Patient is already optimized on bronchodilator therapy, steroid inhalers, Singulair.  Will add prolonged prednisone taper. Azithromycin for 5 days. She is still requiring 2 L oxygen with mobility, will prescribe at home.   Chest pain, demand ischemia: Myocardial injury secondary to  hypoxemia. CTA chest negative for PE. Treated with heparin infusion, follow cardiology. Underwent cardiac cath, moderate coronary artery disease, no indication for PCI.  On aspirin and statin.   Current diastolic congestive heart failure: Back on Lasix.  Stable now.   Patient's hypertension: Blood pressure stable on Aldactone, furosemide and verapamil.   Laryngeal cancer: Recently diagnosed with laryngeal cancer.  Scheduled for CT scan of the soft tissue of the neck and CT scan of the chest with contrast.  CT angiogram on 7/20 was without any obvious evidence of metastatic disease.  Patient with significant trouble with breathing, she will have difficulty coming back for procedure tomorrow.  She was able to get CT scan of her neck and chest today before discharge.  She is scheduled to follow-up with ENT.     Smoker: Continues to smoke.  She has COPD, coronary artery disease, peripheral vascular disease and all atherosclerotic damage from smoking.  She is determined to stop smoking now.  Stable for discharge today .  Will need ongoing follow-up.  Discharge Diagnoses:  Principal Problem:   Demand ischemia Active Problems:   CAD (coronary artery disease)   COPD (chronic obstructive pulmonary disease) (HCC)   Laryngeal cancer (HCC)   Hypertension   Chronic diastolic CHF (congestive heart failure) (HCC)    Discharge Instructions  Discharge Instructions     AMB referral to Phase II Cardiac Rehabilitation   Complete by: As directed    Not indicated - NOT a NSTEMI -- Demand Ischemia   Diagnosis: Other   After initial evaluation and assessments completed: Virtual Based Care may be provided alone or in conjunction with Phase 2 Cardiac Rehab based on patient barriers.: No   Intensive Cardiac Rehabilitation (ICR) [facility] location only OR Traditional Cardiac Rehabilitation (TCR) *If criteria for ICR are not met will enroll in TCR [facility] only): Yes  Diet - low sodium heart healthy   Complete by:  As directed    Increase activity slowly   Complete by: As directed       Allergies as of 04/11/2023       Reactions   Bee Venom Anaphylaxis, Other (See Comments)   Welts, also    Insect bites- Welts, also   Insect Extract Anaphylaxis, Other (See Comments)   Insect bites- Welts, also   Farxiga [dapagliflozin] Nausea And Vomiting   Lyrica [pregabalin] Nausea And Vomiting   Neurontin [gabapentin] Nausea And Vomiting, Other (See Comments)   Hypertension/dizziness   Nsaids Nausea Only, Other (See Comments)   Patient is not suppose to have this class of medication (liver) Can tolerate 81mg , which she is taking   Latex Rash, Other (See Comments)        Medication List     TAKE these medications    albuterol (2.5 MG/3ML) 0.083% nebulizer solution Commonly known as: PROVENTIL Take 2.5 mg by nebulization every 6 (six) hours as needed for wheezing or shortness of breath.   albuterol 108 (90 Base) MCG/ACT inhaler Commonly known as: VENTOLIN HFA Inhale 2 puffs into the lungs every 6 (six) hours as needed for wheezing or shortness of breath.   alendronate 70 MG tablet Commonly known as: FOSAMAX Take 70 mg by mouth every Wednesday.   allopurinol 300 MG tablet Commonly known as: ZYLOPRIM Take 300 mg by mouth every evening.   Antacid 500 MG chewable tablet Generic drug: calcium carbonate Chew 1 tablet by mouth 2 (two) times daily as needed for heartburn or indigestion.   aspirin EC 81 MG tablet Take 81 mg by mouth in the morning.   atorvastatin 80 MG tablet Commonly known as: LIPITOR Take 80 mg by mouth at bedtime.   azithromycin 500 MG tablet Commonly known as: ZITHROMAX Take 1 tablet (500 mg total) by mouth daily for 3 days. Start taking on: April 12, 2023   clopidogrel 75 MG tablet Commonly known as: Plavix Take 1 tablet (75 mg total) by mouth daily. What changed: when to take this   cyanocobalamin 1000 MCG tablet Commonly known as: VITAMIN B12 Take 1,000 mcg  by mouth daily.   EpiPen 2-Pak 0.3 MG/0.3ML Soaj injection Generic drug: EPINEPHrine Inject 0.3 mg into the muscle daily as needed for anaphylaxis. Use as directed as needed for allergies.   ezetimibe 10 MG tablet Commonly known as: ZETIA Take 1 tablet (10 mg total) by mouth daily. What changed: when to take this   furosemide 40 MG tablet Commonly known as: LASIX Take 1 tablet (40 mg total) by mouth daily.   lactulose 10 GM/15ML solution Commonly known as: CHRONULAC Take 20 g by mouth 2 (two) times daily as needed for moderate constipation.   melatonin 5 MG Tabs Take 5 mg by mouth at bedtime.   Muscle Rub 10-15 % Crea Apply 1 application topically daily as needed (for arthritis pain).   nitroGLYCERIN 0.4 MG SL tablet Commonly known as: NITROSTAT Place 1 tablet (0.4 mg total) under the tongue every 5 (five) minutes x 3 doses as needed for chest pain.   Oxycodone HCl 10 MG Tabs Take 1 tablet (10 mg total) by mouth 4 (four) times daily as needed (for pain).   pantoprazole 40 MG tablet Commonly known as: PROTONIX Take 40 mg by mouth 2 (two) times daily.   potassium chloride SA 20 MEQ tablet Commonly known as: Klor-Con M20 Take 1 tablet (20 mEq total) by mouth daily.  predniSONE 10 MG tablet Commonly known as: DELTASONE 4 tabs daily for 3 days 3 tabs daily for 3 days 2 tabs daily for 3 days 1 tab daily for 3 days   senna-docusate 8.6-50 MG tablet Commonly known as: Senokot-S Take 2 tablets by mouth at bedtime.   Spiriva Respimat 2.5 MCG/ACT Aers Generic drug: Tiotropium Bromide Monohydrate Take 2 puffs by mouth daily.   spironolactone 25 MG tablet Commonly known as: ALDACTONE Take 1 tablet (25 mg total) by mouth daily.   verapamil 240 MG CR tablet Commonly known as: CALAN-SR Take 1 tablet (240 mg total) by mouth daily.   Vitamin D 125 MCG (5000 UT) Caps Take 5,000 Units by mouth in the morning.               Durable Medical Equipment  (From  admission, onward)           Start     Ordered   04/10/23 1422  For home use only DME oxygen  Once       Comments: Patient Saturations on Room Air at Rest = spO2 93%   Patient Saturations on Room Air while Ambulating = sp02 88% .     Patient Saturations on 2 Liters of oxygen while Ambulating = sp02 95%  Question Answer Comment  Length of Need Lifetime   Mode or (Route) Nasal cannula   Liters per Minute 2   Frequency Continuous (stationary and portable oxygen unit needed)   Oxygen conserving device Yes   Oxygen delivery system Gas      04/10/23 1422            Allergies  Allergen Reactions   Bee Venom Anaphylaxis and Other (See Comments)    Welts, also    Insect bites- Welts, also   Insect Extract Anaphylaxis and Other (See Comments)    Insect bites- Welts, also   [place] [Dapagliflozin] Nausea And Vomiting   Lyrica [Pregabalin] Nausea And Vomiting   Neurontin [Gabapentin] Nausea And Vomiting and Other (See Comments)    Hypertension/dizziness   Nsaids Nausea Only and Other (See Comments)    Patient is not suppose to have this class of medication (liver)  Can tolerate 81mg , which she is taking   Latex Rash and Other (See Comments)    Consultations: Cardiology   Procedures/Studies: CARDIAC CATHETERIZATION  Result Date: 04/08/2023   Dist LM to Mid LAD lesion is 15% stenosed.  Calcified   Mid LAD lesion is 40% stenosed.  Calcified   1st Mrg lesion is 5% stenosed.  Calcified   LV end diastolic pressure is normal.   Recommend Aspirin 81mg  daily for moderate CAD.  POST-OPERATIVE DIAGNOSIS:  Diffuse Coronary Calcification with only mild-moderate non-obstructive disease - NO Culprit Lesion to explain + Troponin -- DEMAND ISCHEMIA not NSTEMI Normal LVEDP ~11 mmHg RECOMMENDATIONS   Anticipated discharge date to be determined.   Treat underlying cause-COPD exacerbation   Recommend Aspirin 81mg  daily for moderate CAD. Bryan Lemma, MD\  ECHOCARDIOGRAM COMPLETE  Result  Date: 04/07/2023    ECHOCARDIOGRAM REPORT   Patient Name:   Marie Johnson Date of Exam: 04/07/2023 Medical Rec #:  [id number]      Height:       67.0 in Accession #:    [id number]     Weight:       128.4 lb Date of Birth:  February 21, 1951      BSA:          1.675 m Patient Age:  71 years       BP:           138/73 mmHg Patient Gender: F              HR:           75 bpm. Exam Location:  Inpatient Procedure: 2D Echo, Color Doppler and Cardiac Doppler Indications:    Elevated Troponin  History:        Patient has prior history of Echocardiogram examinations, most                 recent 03/10/2020. CAD; COPD.  Sonographer:    Darlys Gales Referring Phys: [phone] JARED M GARDNER IMPRESSIONS  1. Small region of apical hypokinesis/akinesis. Basal inferior hypokinesis. . Left ventricular ejection fraction, by estimation, is 55 to 60%. The left ventricle has normal function.  2. Right ventricular systolic function is normal. The right ventricular size is normal.  3. Left atrial size was mildly dilated.  4. The mitral valve is normal in structure. Mild mitral valve regurgitation.  5. The aortic valve is tricuspid. Aortic valve regurgitation is not visualized. Aortic valve sclerosis is present, with no evidence of aortic valve stenosis. FINDINGS  Left Ventricle: Small region of apical hypokinesis/akinesis. Basal inferior hypokinesis. Left ventricular ejection fraction, by estimation, is 55 to 60%. The left ventricle has normal function. The left ventricular internal cavity size was normal in size. There is no left ventricular hypertrophy. Right Ventricle: The right ventricular size is normal. Right vetricular wall thickness was not assessed. Right ventricular systolic function is normal. Left Atrium: Left atrial size was mildly dilated. Right Atrium: Right atrial size was normal in size. Pericardium: Trivial pericardial effusion is present. Mitral Valve: The mitral valve is normal in structure. There is mild thickening of the mitral  valve leaflet(s). Mild mitral annular calcification. Mild mitral valve regurgitation. MV peak gradient, 5.3 mmHg. The mean mitral valve gradient is 3.0 mmHg. Tricuspid Valve: The tricuspid valve is normal in structure. Tricuspid valve regurgitation is trivial. Aortic Valve: The aortic valve is tricuspid. Aortic valve regurgitation is not visualized. Aortic valve sclerosis is present, with no evidence of aortic valve stenosis. Aortic valve mean gradient measures 3.0 mmHg. Aortic valve peak gradient measures 5.5  mmHg. Aortic valve area, by VTI measures 3.00 cm. Pulmonic Valve: The pulmonic valve was not well visualized. Pulmonic valve regurgitation is not visualized. Aorta: The aortic root is normal in size and structure. IAS/Shunts: No atrial level shunt detected by color flow Doppler.  LEFT VENTRICLE PLAX 2D LVIDd:         4.50 cm   Diastology LVIDs:         2.80 cm   LV e' medial:    5.33 cm/s LV PW:         0.80 cm   LV E/e' medial:  19.3 LV IVS:        0.80 cm   LV e' lateral:   4.68 cm/s LVOT diam:     1.80 cm   LV E/e' lateral: 22.0 LV SV:         67 LV SV Index:   40 LVOT Area:     2.54 cm  RIGHT VENTRICLE RV S prime:     9.25 cm/s TAPSE (M-mode): 1.9 cm LEFT ATRIUM             Index        RIGHT ATRIUM          Index LA  Vol (A2C):   53.9 ml 32.18 ml/m  RA Area:     9.48 cm LA Vol (A4C):   54.9 ml 32.77 ml/m  RA Volume:   15.90 ml 9.49 ml/m LA Biplane Vol: 56.6 ml 33.79 ml/m  AORTIC VALVE AV Area (Vmax):    2.44 cm AV Area (Vmean):   2.48 cm AV Area (VTI):     3.00 cm AV Vmax:           117.00 cm/s AV Vmean:          83.600 cm/s AV VTI:            0.223 m AV Peak Grad:      5.5 mmHg AV Mean Grad:      3.0 mmHg LVOT Vmax:         112.00 cm/s LVOT Vmean:        81.500 cm/s LVOT VTI:          0.263 m LVOT/AV VTI ratio: 1.18  AORTA Ao Root diam: 2.30 cm MITRAL VALVE MV Area (PHT): 3.61 cm     SHUNTS MV Area VTI:   2.60 cm     Systemic VTI:  0.26 m MV Peak grad:  5.3 mmHg     Systemic Diam: 1.80 cm MV  Mean grad:  3.0 mmHg MV Vmax:       1.15 m/s MV Vmean:      85.5 cm/s MV Decel Time: 210 msec MV E velocity: 103.00 cm/s MV A velocity: 109.00 cm/s MV E/A ratio:  0.94 Dietrich Pates MD Electronically signed by Dietrich Pates MD Signature Date/Time: 04/07/2023/3:27:13 PM    Final    CT Angio Chest PE W and/or Wo Contrast  Result Date: 04/06/2023 CLINICAL DATA:  Positive D-dimer and shortness of breath, initial encounter EXAM: CT ANGIOGRAPHY CHEST WITH CONTRAST TECHNIQUE: Multidetector CT imaging of the chest was performed using the standard protocol during bolus administration of intravenous contrast. Multiplanar CT image reconstructions and MIPs were obtained to evaluate the vascular anatomy. RADIATION DOSE REDUCTION: This exam was performed according to the departmental dose-optimization program which includes automated exposure control, adjustment of the mA and/or kV according to patient size and/or use of iterative reconstruction technique. CONTRAST:  75mL OMNIPAQUE IOHEXOL 350 MG/ML SOLN COMPARISON:  Chest x-ray from earlier in the same day, CT from 04/20/2022 FINDINGS: Cardiovascular: Atherosclerotic calcifications of the thoracic aorta are noted. No ascending aneurysmal dilatation is seen. No significant aortic opacification is noted to suggest dissection. Descending aorta demonstrates stable dilatation at the level of the aortic hiatus measuring up to 4.5 cm. This is relatively stable from the prior exam. Heart is mildly enlarged in size. The pulmonary artery shows a normal branching pattern bilaterally. No filling defect to suggest pulmonary embolism is noted. Scattered coronary calcifications are noted. 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 Mild contour deformity is noted suggesting stability. Mediastinum/Nodes: Thoracic inlet is within normal limits. No sizable hilar or mediastinal adenopathy  is noted. Calcified lymph nodes are noted within the hila and mediastinum consistent with prior granulomatous disease. Lungs/Pleura: Lungs are well aerated bilaterally. Scattered calcified granulomas are seen. Mild emphysematous change is noted. Mild right basilar atelectasis is noted. Upper Abdomen: Visualized upper abdomen shows calcified splenic granulomas. No other focal abnormality is seen. Vascular calcifications are noted. Musculoskeletal: No acute rib abnormality is noted. Degenerative changes of the thoracic spine are seen. No compression  deformity is noted. Review of the MIP images confirms the above findings. IMPRESSION: No evidence of pulmonary emboli. 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 The overall morphology of the aorta is stable however. Changes consistent with prior granulomatous disease. Aortic Atherosclerosis (ICD10-I70.0) and Emphysema (ICD10-J43.9). Electronically Signed   By: Alcide Clever M.D.   On: 04/06/2023 20:40   DG Chest Port 1 View  Result Date: 04/06/2023 CLINICAL DATA:  Shortness of breath EXAM: PORTABLE CHEST 1 VIEW COMPARISON:  Chest x-ray 03/18/2019 FINDINGS: There some atelectasis in the lung bases. There is a small left pleural effusion. There is no pneumothorax or acute fracture. The heart is mildly enlarged, unchanged. IMPRESSION: 1. Small left pleural effusion. 2. Bibasilar atelectasis. Electronically Signed   By: Darliss Cheney M.D.   On: 04/06/2023 17:27   (Echo, Carotid, EGD, Colonoscopy, ERCP)    Subjective: Patient seen and examined.  Her breathing much better today.  She is agreeable to go home with oxygen.   Discharge Exam: Vitals:   04/11/23 1015 04/11/23 1052  BP: (!) 158/71 (!) 151/74  Pulse:  83  Resp:  18  Temp:  98.4 F (36.9 C)  SpO2:  92%   Vitals:   04/10/23 2332 04/11/23 0348 04/11/23 1015 04/11/23 1052   BP: 136/65 (!) 147/79 (!) 158/71 (!) 151/74  Pulse: 78 69  83  Resp: 18 20  18   Temp: 99 F (37.2 C) 97.7 F (36.5 C)  98.4 F (36.9 C)  TempSrc: Oral Oral  Oral  SpO2: 94% 95%  92%  Weight:        General: Pt is alert, awake, not in acute distress Frail debilitated.  Chronically sick looking.  Comfortable and talking in complete sentences. Cardiovascular: RRR, S1/S2 +, no rubs, no gallops Respiratory: No added sounds.  Currently on 2 L oxygen. Abdominal: Soft, NT, ND, bowel sounds + Extremities: no edema, no cyanosis    The results of significant diagnostics from this hospitalization (including imaging, microbiology, ancillary and laboratory) are listed below for reference.     Microbiology: Recent Results (from the past 240 hour(s))  Resp panel by RT-PCR (RSV, Flu A&B, Covid) Anterior Nasal Swab     Status: None   Collection Time: 04/06/23  5:55 PM   Specimen: Anterior Nasal Swab  Result Value Ref Range Status   SARS Coronavirus 2 by RT PCR NEGATIVE NEGATIVE Final   Influenza A by PCR NEGATIVE NEGATIVE Final   Influenza B by PCR NEGATIVE NEGATIVE Final    Comment: (NOTE) The Xpert Xpress SARS-CoV-2/FLU/RSV plus assay is intended as an aid in the diagnosis of influenza from Nasopharyngeal swab specimens and should not be used as a sole basis for treatment. Nasal washings and aspirates are unacceptable for Xpert Xpress SARS-CoV-2/FLU/RSV testing.  Fact Sheet for Patients: BloggerCourse.com  Fact Sheet for Healthcare Providers: SeriousBroker.it  This test is not yet approved or cleared by the [place] FDA and has been authorized for detection and/or diagnosis of SARS-CoV-2 by FDA under an Emergency Use Authorization (EUA). This EUA will remain in effect (meaning this test can be used) for the duration of the COVID-19 declaration under Section 564(b)(1) of the Act, 21 U.S.C. section 360bbb-3(b)(1), unless the  authorization is terminated or revoked.     Resp Syncytial Virus by PCR NEGATIVE NEGATIVE Final    Comment: (NOTE) Fact Sheet for Patients: BloggerCourse.com  Fact Sheet for  Healthcare Providers: SeriousBroker.it  This test is not yet approved or cleared by the [place] and has been authorized for detection and/or diagnosis of SARS-CoV-2 by FDA under an Emergency Use Authorization (EUA). This EUA will remain in effect (meaning this test can be used) for the duration of the COVID-19 declaration under Section 564(b)(1) of the Act, 21 U.S.C. section 360bbb-3(b)(1), unless the authorization is terminated or revoked.  Performed at [facility], [address], [place], [place] [address]      Labs: BNP (last 3 results) Recent Labs    04/06/23 1756  BNP 265.9*   Basic Metabolic Panel: Recent Labs  Lab 04/06/23 1755 04/07/23 1226 04/08/23 0655 04/10/23 0339 04/11/23 0733  NA 137 131* 136 135 133*  K 3.1* 4.2 4.5 4.0 4.7  CL 100 97* 105 99 98  CO2 26 27 25 28 24   GLUCOSE 100* 92 175* 181* 122*  BUN 10 12 13  28* 23  CREATININE 0.68 0.68 0.76 1.16* 0.74  CALCIUM 8.6* 8.4* 8.7* 8.5* 8.8*  MG  --  1.8 2.0 2.1  --   PHOS  --  3.0 3.4  --   --    Liver Function Tests: Recent Labs  Lab 04/06/23 1755 04/07/23 1226 04/08/23 0655  AST 23  --   --   ALT 20  --   --   ALKPHOS 47  --   --   BILITOT 0.7  --   --   PROT 6.4*  --   --   ALBUMIN 3.1* 2.8* 3.0*   No results for input(s): "LIPASE", "AMYLASE" in the last 168 hours. No results for input(s): "AMMONIA" in the last 168 hours. CBC: Recent Labs  Lab 04/06/23 1755 04/08/23 0655 04/10/23 0339  WBC 10.5 7.0 7.1  HGB 12.9 13.2 12.3  HCT 38.3 40.1 36.8  MCV 97.2 98.3 99.2  PLT 244 243 250   Cardiac Enzymes: No results for input(s): "CKTOTAL", "CKMB", "CKMBINDEX", "TROPONINI" in the last 168 hours. BNP: Invalid input(s): "POCBNP" CBG: No results  for input(s): "GLUCAP" in the last 168 hours. D-Dimer No results for input(s): "DDIMER" in the last 72 hours. Hgb A1c No results for input(s): "HGBA1C" in the last 72 hours. Lipid Profile No results for input(s): "CHOL", "HDL", "LDLCALC", "TRIG", "CHOLHDL", "LDLDIRECT" in the last 72 hours. Thyroid function studies No results for input(s): "TSH", "T4TOTAL", "T3FREE", "THYROIDAB" in the last 72 hours.  Invalid input(s): "FREET3" Anemia work up No results for input(s): "VITAMINB12", "FOLATE", "FERRITIN", "TIBC", "IRON", "RETICCTPCT" in the last 72 hours. Urinalysis    Component Value Date/Time   COLORURINE STRAW (A) 03/15/2020 1007   APPEARANCEUR CLEAR 03/15/2020 1007   LABSPEC 1.005 03/15/2020 1007   PHURINE 6.0 03/15/2020 1007   GLUCOSEU NEGATIVE 03/15/2020 1007   HGBUR SMALL (A) 03/15/2020 1007   BILIRUBINUR NEGATIVE 03/15/2020 1007   KETONESUR NEGATIVE 03/15/2020 1007   PROTEINUR NEGATIVE 03/15/2020 1007   NITRITE NEGATIVE 03/15/2020 1007   LEUKOCYTESUR NEGATIVE 03/15/2020 1007   Sepsis Labs Recent Labs  Lab 04/06/23 1755 04/08/23 0655 04/10/23 0339  WBC 10.5 7.0 7.1   Microbiology Recent Results (from the past 240 hour(s))  Resp panel by RT-PCR (RSV, Flu A&B, Covid) Anterior Nasal Swab     Status: None   Collection Time: 04/06/23  5:55 PM   Specimen: Anterior Nasal Swab  Result Value Ref Range Status   SARS Coronavirus 2 by RT PCR NEGATIVE NEGATIVE Final   Influenza A by PCR NEGATIVE NEGATIVE  Final   Influenza B by PCR NEGATIVE NEGATIVE Final    Comment: (NOTE) The Xpert Xpress SARS-CoV-2/FLU/RSV plus assay is intended as an aid in the diagnosis of influenza from Nasopharyngeal swab specimens and should not be used as a sole basis for treatment. Nasal washings and aspirates are unacceptable for Xpert Xpress SARS-CoV-2/FLU/RSV testing.  Fact Sheet for Patients: BloggerCourse.com  Fact Sheet for Healthcare  Providers: SeriousBroker.it  This test is not yet approved or cleared by the [place] FDA and has been authorized for detection and/or diagnosis of SARS-CoV-2 by FDA under an Emergency Use Authorization (EUA). This EUA will remain in effect (meaning this test can be used) for the duration of the COVID-19 declaration under Section 564(b)(1) of the Act, 21 U.S.C. section 360bbb-3(b)(1), unless the authorization is terminated or revoked.     Resp Syncytial Virus by PCR NEGATIVE NEGATIVE Final    Comment: (NOTE) Fact Sheet for Patients: BloggerCourse.com  Fact Sheet for Healthcare Providers: SeriousBroker.it  This test is not yet approved or cleared by the [place] FDA and has been authorized for detection and/or diagnosis of SARS-CoV-2 by FDA under an Emergency Use Authorization (EUA). This EUA will remain in effect (meaning this test can be used) for the duration of the COVID-19 declaration under Section 564(b)(1) of the Act, 21 U.S.C. section 360bbb-3(b)(1), unless the authorization is terminated or revoked.  Performed at [facility], [address] [place], [place] [address]      Time coordinating discharge: 32 minutes  SIGNED:   Dorcas Carrow, MD  [facility] 04/11/2023, 1:06 PM

## 2023-04-12 ENCOUNTER — Ambulatory Visit (HOSPITAL_COMMUNITY): Payer: Medicare Other

## 2023-04-12 ENCOUNTER — Encounter (HOSPITAL_COMMUNITY): Payer: Self-pay

## 2023-04-12 DIAGNOSIS — C329 Malignant neoplasm of larynx, unspecified: Secondary | ICD-10-CM | POA: Diagnosis not present

## 2023-04-12 DIAGNOSIS — J441 Chronic obstructive pulmonary disease with (acute) exacerbation: Secondary | ICD-10-CM | POA: Diagnosis not present

## 2023-04-16 DIAGNOSIS — Z79899 Other long term (current) drug therapy: Secondary | ICD-10-CM | POA: Diagnosis not present

## 2023-04-16 DIAGNOSIS — I2510 Atherosclerotic heart disease of native coronary artery without angina pectoris: Secondary | ICD-10-CM | POA: Diagnosis not present

## 2023-04-16 DIAGNOSIS — J432 Centrilobular emphysema: Secondary | ICD-10-CM | POA: Diagnosis not present

## 2023-04-16 DIAGNOSIS — Z7689 Persons encountering health services in other specified circumstances: Secondary | ICD-10-CM | POA: Diagnosis not present

## 2023-04-16 DIAGNOSIS — C321 Malignant neoplasm of supraglottis: Secondary | ICD-10-CM | POA: Diagnosis not present

## 2023-04-18 DIAGNOSIS — C329 Malignant neoplasm of larynx, unspecified: Secondary | ICD-10-CM | POA: Diagnosis not present

## 2023-04-19 ENCOUNTER — Encounter: Payer: Self-pay | Admitting: *Deleted

## 2023-04-19 ENCOUNTER — Other Ambulatory Visit: Payer: Self-pay | Admitting: Radiation Oncology

## 2023-04-19 DIAGNOSIS — C329 Malignant neoplasm of larynx, unspecified: Secondary | ICD-10-CM

## 2023-04-19 NOTE — Progress Notes (Signed)
Head and Neck Cancer Location of Tumor / Histology:   CT SOFT TISSUE NECK W CONTRAST 04/11/2023  IMPRESSION: 1. No evidence of primary malignancy or metastatic disease in the neck. 2. 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 3. Calcified plaque at the bilateral carotid bifurcations, possibly hemodynamically significant. 4. 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 5. CT chest reported separately.  Patient presented with symptoms of: chronic dysphonia.  Biopsies revealed:  03/13/2023  FINAL MICROSCOPIC DIAGNOSIS:   A. LARYNGEAL SURFACE OF EPIGLOTTIS, BIOPSY:  - Invasive squamous cell carcinoma (see Comment)   B. VOCAL CORD, LEFT TRUE, BIOPSY:  - Invasive squamous cell carcinoma (see Comment)   COMMENT:   This case was reviewed with Dr. Reynolds Bowl who agrees with the above diagnosis.    Nutrition Status Yes No Comments  Weight changes? [x]  []  Hard to tell due to fluid balance issues  Swallowing concerns? [x]  []  Pt does not have any teeth, has false teeth on top  PEG? []  [x]     Referrals Yes No Comments  Social Work? [x]  []    Dentistry? []  [x]    Swallowing therapy? [x]  []    Nutrition? [x]  []    Med/Onc? [x]  []     Safety Issues Yes No Comments  Prior radiation? []  [x]    Pacemaker/ICD? []  [x]    Possible current pregnancy? []  [x]    Is the patient on methotrexate? []  [x]     Tobacco/Marijuana/Snuff/ETOH use: quit smoking over six weeks ago, thinks about two months  Past/Anticipated interventions by otolaryngology, if any:  Lubertha Sayres, PA-C  12/31/2022  HPI:   Cc: recheck hoarseness.   Aniqa Maury is a 72 y.o. female who presents as a for 42-month return visit to recheck her larynx. Patient last seen on 06/11/22 for chronic dysphonia. 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 She was  encouraged to continue twice daily Protonix, 40mg . Patient had declined referral to speech therapy. She returns today; vocal quality is unchanged. She continues to drink 2 cups of coffee daily and smokes about 7 cigarettes per day. Her weight has remained stable. She takes Plavix (her vascular specialist is Dr. Clotilde Dieter).  Denies fever, otalgia, otorrhea, ear pruritus, tinnitus, dizziness, sore throat, cough, dysphagia, odynophagia or oral regurgitation. No difficulty breathing   Procedure: After adequate topical anesthetic was applied, 4 mm flexible laryngoscope was passed through the right nasal cavity without difficulty. Flexible laryngoscopy shows patent anterior nasal cavity with minimal crusting, no discharge or infection.  Normal base of tongue and supraglottis. Epiglottis with superficial ulceration with irregular, raised borders. Vocal cord mobility is normal. Polypoid changes to the vocal cords. No vocal cord nodule, mass, polyp or tumor. Hypopharynx normal without mass, pooling of secretions or aspiration.   Impression & Plans:  Kris Niu is a 72 y.o. female is here for 51-month recheck of her larynx. Fiberoptic exam demonstrates a superficial ulceration with raised, irregular borders of the inferior portion of the epiglottis. Recommend direct microlaryngoscopy with biopsy at [facility] with Dr. Jenne Pane. We will obtain medical clearance from patient's vascular specialist, Dr. Clotilde Dieter to stop the Plavix pre-operatively.   Christia Reading, MD 03/13/2023  PREOPERATIVE DIAGNOSIS:  Lesion of epiglottis   POSTOPERATIVE DIAGNOSIS:  Lesion of epiglottis and left vocal cord lesion   PROCEDURE:  Suspended microdirect laryngoscopy with biopsy   SURGEON:  Christia Reading, MD  Sammuel Hines, MD -  04/18/2023  The patient is a 72 year old female who presents for follow-up from a laryngoscopy and biopsy. She is accompanied by her daughter.  She did well with the procedure and has no  new complaints.  Exam Moderately hoarse voice.  Laboratory Studies Biopsies from the epiglottis and the left vocal cord showed evidence of invasive squamous cell carcinoma.  Imaging CT scan showed no abnormal findings or signs of enlarged lymph nodes in the neck.  1. Laryngeal cancer. Biopsy results revealed cancerous cells in both the epiglottis and the left vocal cord. The CT scan did not detect any abnormalities or signs of enlarged lymph nodes in the neck, suggesting localized cancer in the larynx, likely stage 2. We discussed treatment options including surgical management and non-surgical management. Referrals will be made to the [facility] for consultations with Heme/Onc and Rad/Onc. She was invited to call back after those appointments if she wishes to discuss options further or elects for surgical management.   Past/Anticipated interventions by medical oncology, if any: referral has been made to medical oncology per ENT note  Current Complaints / other details:  Pt voice remains hoarse. Pt wants to know how long treatment will last. She is concerned about her current health problems and radiation effects. She has neuropathy in her legs chronically which cause mobility issues (uses w/c for long distances). She is now on 02 at home due to breathing difficulty, was recently in the hospital for this. Her daughter currently has kidney cancer. She does report a strong faith in God and that she has strong family support as well.

## 2023-04-19 NOTE — Progress Notes (Signed)
I called patient to let her know that Dr. Basilio Cairo has ordered a PET scan to assist in planning her care.  Patient verbalized understanding and prefers to have her test at [facility] on a Thursday or Friday.  She also confirmed her appointment with Dr. Basilio Cairo on 05/03/23.  She denied any other questions at this time.  I encouraged her to call with any questions or needs.

## 2023-04-22 ENCOUNTER — Telehealth: Payer: Self-pay | Admitting: *Deleted

## 2023-04-22 NOTE — Telephone Encounter (Signed)
CALLED PATIENT TO INFORM OF PET SCAN ON 04-26-23- ARRIVAL TIME- 10:30 AM @ [facility], PATIENT TO HAVE WATER ONLY - 6 HRS. PRIOR TO TEST, SPOKE WITH PATIENT AND HE IS AWARE OF THIS SCAN AND THE INSTRUCTIONS

## 2023-04-23 NOTE — Progress Notes (Signed)
Oncology Nurse Navigator Documentation   Placed introductory call to new referral patient Marie Johnson.  Introduced myself as the H&N oncology nurse navigator that works with Dr. Basilio Cairo to whom she has been referred by Dr. Jenne Pane. She confirmed understanding of referral. Briefly explained my role as her navigator, provided my contact information.  Confirmed understanding of upcoming appts and [facility] location, explained arrival and registration process.  I encouraged her to call with questions/concerns as she moves forward with appts and procedures.   She verbalized understanding of information provided, expressed appreciation for my call.   Navigator Initial Assessment Employment Status: she is retired Currently on [employer] / STD: no Living Situation: She lives with family Support System: husband, family PCP: Burnell Blanks MD PCD: unknown Financial Concerns: not at this time Transportation Needs: no Sensory Deficits: no Chiropodist Needed:  no Ambulation Needs: no Psychosocial Needs:  no Concerns/Needs Understanding Cancer:  addressed/answered by navigator to best of ability Self-Expressed Needs: no   Tourist information centre manager, BSN, OCN Head & Neck Oncology Nurse Navigator [facility] at [facility] Phone # [phone]  Fax # [phone]

## 2023-04-24 DIAGNOSIS — M25552 Pain in left hip: Secondary | ICD-10-CM | POA: Diagnosis not present

## 2023-04-24 DIAGNOSIS — G8929 Other chronic pain: Secondary | ICD-10-CM | POA: Diagnosis not present

## 2023-04-24 DIAGNOSIS — M25551 Pain in right hip: Secondary | ICD-10-CM | POA: Diagnosis not present

## 2023-04-24 DIAGNOSIS — I739 Peripheral vascular disease, unspecified: Secondary | ICD-10-CM | POA: Diagnosis not present

## 2023-04-24 NOTE — Progress Notes (Signed)
Oncology Nurse Navigator Documentation   Marie Johnson has been referred to Marie Johnson by Marie Johnson for Laryngeal Cancer. Marie Johnson ordered a PET before her consult to provide further imaging needed for treatment planning. The PET ordered by Marie Johnson was denied by her insurance. I called Marie Johnson' office yesterday to request it be ordered by his office because he has seen her recently. I will follow to assure that it is ordered and scheduled as soon as possible.   Marie Slade RN, BSN, OCN Head & Neck Oncology Nurse Navigator [facility] at [facility] Phone # [phone]  Fax # [phone]

## 2023-04-25 ENCOUNTER — Other Ambulatory Visit (HOSPITAL_COMMUNITY): Payer: Self-pay | Admitting: Otolaryngology

## 2023-04-25 DIAGNOSIS — C329 Malignant neoplasm of larynx, unspecified: Secondary | ICD-10-CM

## 2023-04-26 ENCOUNTER — Encounter (HOSPITAL_COMMUNITY)
Admission: RE | Admit: 2023-04-26 | Discharge: 2023-04-26 | Disposition: A | Discharge status: Home / Self Care | Payer: Medicare Other | Source: Ambulatory Visit | Attending: Radiation Oncology | Admitting: Radiation Oncology

## 2023-04-26 DIAGNOSIS — C329 Malignant neoplasm of larynx, unspecified: Secondary | ICD-10-CM | POA: Diagnosis not present

## 2023-04-26 DIAGNOSIS — R918 Other nonspecific abnormal finding of lung field: Secondary | ICD-10-CM | POA: Diagnosis not present

## 2023-04-26 LAB — GLUCOSE, CAPILLARY: Glucose-Capillary: 107 mg/dL — ABNORMAL HIGH (ref 70–99)

## 2023-04-26 MED ORDER — FLUDEOXYGLUCOSE F - 18 (FDG) INJECTION
6.1000 | Freq: Once | INTRAVENOUS | Status: AC | PRN
  Administered 2023-04-26: 6.1 via INTRAVENOUS

## 2023-04-30 ENCOUNTER — Ambulatory Visit: Payer: Medicare Other

## 2023-04-30 ENCOUNTER — Ambulatory Visit (HOSPITAL_COMMUNITY): Payer: Medicare Other

## 2023-05-02 ENCOUNTER — Telehealth: Payer: Self-pay

## 2023-05-02 ENCOUNTER — Other Ambulatory Visit: Payer: Self-pay | Admitting: Surgery

## 2023-05-02 ENCOUNTER — Encounter: Payer: Self-pay | Admitting: Radiation Oncology

## 2023-05-02 DIAGNOSIS — I712 Thoracic aortic aneurysm, without rupture, unspecified: Secondary | ICD-10-CM

## 2023-05-02 NOTE — Progress Notes (Signed)
Radiation Oncology         [phone] ________________________________  Initial Outpatient Consultation  Name: Marie Johnson MRN: [id number]  Date: 05/03/2023  DOB: 1951-06-15  WU:[id number], Durward Fortes, MD  Christia Reading, MD   REFERRING PHYSICIAN: Christia Reading, MD  DIAGNOSIS:    ICD-10-CM   1. Malignant neoplasm of glottis (HCC)  C32.0     2. 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 female who presents today for consideration of radiation therapy in management of her recently diagnosed laryngeal cancer. Prior to her cancer diagnosis, the patient established care with [facility] on 11/09/21 after presenting with a 5 month history of vocal hoarseness (chronic dysphonia). Due to persistent dysphonia, she underwent a fiberoptic exam of the larynx at that time which demonstrated polypoid changes of the left true vocal cord and diffuse post-glottic edema and erythema. She was subsequently recommended aggressive treatment for reflux with twice daily Protonix.   Based on routine 6 month follow-up visits with [facility], her chronic dysphonia remained stable throughout the remainder of 2023.   She accordingly followed up with ENT on 12/31/22 for routine  re-evaluation. During which time, the patient reported no change in her vocal quality. However, laryngoscopy performed during this visit showed a superficial ulceration with raised, irregular borders of the inferior portion of the epiglottis.   Subsequently, the patient underwent microlaryngoscopy with biopsies of the epiglottis and left true vocal fold on 03/13/23 under the care of Dr. Jenne Pane. Pathology revealed findings consistent with invasive squamous cell carcinoma in both biopsy specimens.   Shortly after undergoing biopsies, the patient was hospitalized from 04/06/23 through 04/11/23 for management of COPD exacerbation and demand ischemia. On presentation, she reported a 3-day history of difficulty breathing and left-sided chest pain. Initial labs were positive for d-dimer. Hospital course included left heart catheterization. She remained admitted after catheterization due to COPD exacerbation and for management of her persistent symptoms. Imaging performed while inpatient included:  -- CTA of the chest performed on 07/20 demonstrated: stable fusiform dilatation of the descending thoracic aorta to 4.5. Imaging otherwise showed no evidence of PE and changes consistent with prior granulomatous disease.  -- Soft tissue neck CT with contrast performed prior to discharge on 07/25 demonstrated no evidence of primary malignancy or metastatic disease/metastatic lymphadenopathy in the neck; opacification of the bilateral olfactory clefts which can be seen with respiratory epithelial adenomatoid hamartoma; small age-indeterminate right cerebellar infarcts; and calcified plaque at the bilateral carotid bifurcations  -- Lung cancer screening CT on 04/11/23 showed scattered new small solid pulmonary nodules, including a RLL nodule measuring 3.8 mm (Lung-RADS 2, benign). Resolution of a previously seen RLL nodule was also noted. Other findings included moderate bilateral bronchial wall  thickening with scattered areas  of mucous plugging, severe centrilobular emphysema, and bilateral lower lobe atelectasis.   Pertinent imaging thus far includes a PET scan performed on 04/26/23 which demonstrated asymmetric laryngeal activity (left-greater-than-right) corresponding to the biopsy proven neoplasm; a stable nodular area of architectural distortion in the inferior segment of the lingula (likely reflecting an area of chronic post infectious or inflammatory scarring); and findings suggestive of underlying COPD. Aneurysmal dilatation of the distal descending thoracic aorta measuring up to 4.5 x 4.1 cm was also redemonstrated. CT otherwise showed no definite signs of metastatic disease in the neck, chest, abdomen, or pelvis.  I personally reviewed her images. She states she is undergoing surveillance with C-T surgery for aneurysms  Swallowing issues, if any: none  Weight Changes: none Wt Readings from Last 3 Encounters:  05/02/23 130 lb 4 oz (59.1 kg)  04/11/23 122 lb (55.3 kg)  04/04/23 128 lb 6.4 oz (58.2 kg)   Other symptoms: Vocal hoarseness / chronic dysphonia. 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 She is also now on supplemental O2 at home due to breathing difficulties related to her recent hospitalization.   Tobacco history, if any: previously smoked 1/2 ppd and quit smoking approximately 2 months ago   ETOH abuse, if any: none (does drink one pot of coffee and one soda per day)  Prior cancers, if any: none  Nutrition Status Yes No Comments  Weight changes? [x]   []   Hard to tell due to fluid balance issues  Swallowing concerns? [x]   []   Pt does not have any teeth, has false teeth on top  PEG? []   [x]        Referrals Yes No Comments  Social Work? [x]   []      Dentistry? []   [x]      Swallowing therapy? [x]   []      Nutrition? [x]   []      Med/Onc? [x]   []        Safety Issues Yes No Comments  Prior radiation? []   [x]       Pacemaker/ICD? []   [x]      Possible current pregnancy? []   [x]      Is the patient on methotrexate? []   [x]       Current Complaints / other details:  Pt voice remains hoarse. Pt wants to know how long treatment will last. She is concerned about her current health problems and radiation effects. She has neuropathy in her legs chronically which cause mobility issues (uses w/c for long distances). She is now on 02 at home due to breathing difficulty, was recently in the hospital for this. Her daughter currently has kidney cancer. She does report a strong faith in God and that she has strong family support as well.   PREVIOUS RADIATION THERAPY: No  PAST MEDICAL HISTORY:  has a past medical history of AAA (abdominal aortic aneurysm) (HCC), Anemia, Anxiety, Aortic atherosclerosis (HCC), Arthritis, Asthma, CAD (coronary artery disease), Cancer (HCC), Carotid artery disease (HCC), CHD (congenital heart disease), Chest pain, CHF (congestive heart failure) (HCC), Closed head injury with concussion, COPD (chronic obstructive pulmonary disease) (HCC), Coronary artery disease, Echocardiogram, GERD (gastroesophageal reflux disease), History of blood transfusion, Hyperlipidemia, Hypertension, Neuropathy, OA (osteoarthritis), Osteoporosis, PAD (peripheral artery disease) (HCC), PAD (peripheral artery disease) (HCC), Palpitations (02/22/2016), Pneumonia, Pre-diabetes, Premature ventricular contraction, PUD (peptic ulcer disease), PVD (peripheral vascular disease) (HCC) (07/17/2013), Thoracic aortic aneurysm (HCC), Tobacco abuse, Wears dentures, and Wears glasses.  PAST SURGICAL HISTORY: Past Surgical History:  Procedure Laterality Date   ABDOMINAL AORTOGRAM W/LOWER EXTREMITY N/A 03/09/2020   Procedure: ABDOMINAL AORTOGRAM W/LOWER EXTREMITY;  Surgeon: Cephus Shelling, MD;  Location: [facility];  Service: Cardiovascular;  Laterality: N/A;   ABDOMINAL AORTOGRAM W/LOWER EXTREMITY N/A 07/26/2022   Procedure:  ABDOMINAL AORTOGRAM W/LOWER EXTREMITY;  Surgeon: Cephus Shelling, MD;  Location: [facility];  Service: Cardiovascular;  Laterality: N/A;   ABDOMINAL AORTOGRAM W/LOWER EXTREMITY N/A 09/20/2022   Procedure: ABDOMINAL AORTOGRAM W/LOWER EXTREMITY;  Surgeon: Cephus Shelling, MD;  Location: [facility] CV [facility];  Service: Cardiovascular;  Laterality: N/A;   ABDOMINAL HYSTERECTOMY     BREAST SURGERY     left cyst excision x 2   CARDIAC CATHETERIZATION     several years ago, nonobstructive, "50-60% blockages"   DIRECT LARYNGOSCOPY Bilateral 03/13/2023   Procedure: MICRO DIRECT LARYNGOSCOPY WITH BIOPSY;  Surgeon: Christia Reading, MD;  Location: [facility] OR;  Service: ENT;  Laterality: Bilateral;   ENDARTERECTOMY FEMORAL Right 03/18/2020   Procedure: ENDARTERECTOMY FEMORAL WITH PROFUNDAPLASTY;  Surgeon: Cephus Shelling, MD;  Location: [facility];  Service: Vascular;  Laterality: Right;   FEMORAL-POPLITEAL BYPASS GRAFT Right 03/18/2020   Procedure: BYPASS GRAFT FEMORAL-POPLITEAL ARTERY using GORE PROPATEN VASCULAR GRAFT;  Surgeon: Cephus Shelling, MD;  Location: [facility] OR;  Service: Vascular;  Laterality: Right;   HIP SURGERY     left - bars and screws placed   HIP SURGERY     INSERTION OF ILIAC STENT Right 03/18/2020   Procedure: INSERTION OF GORE VIABAHN  ILIAC STENT;  Surgeon: Cephus Shelling, MD;  Location: [facility] OR;  Service: Vascular;  Laterality: Right;   LEFT HEART CATH AND CORONARY ANGIOGRAPHY N/A 04/08/2023   Procedure: LEFT HEART CATH AND CORONARY ANGIOGRAPHY;  Surgeon: Marykay Lex, MD;  Location: [facility] INVASIVE CV LAB;  Service: Cardiovascular;  Laterality: N/A;   LEFT HEART CATHETERIZATION WITH CORONARY ANGIOGRAM N/A 07/17/2013   Procedure: LEFT HEART CATHETERIZATION WITH CORONARY ANGIOGRAM;  Surgeon: Laurey Morale, MD;  Location: [facility] CATH LAB;  Service: Cardiovascular;  Laterality: N/A;   LOWER EXTREMITY ANGIOGRAM Right 03/18/2020   Procedure: RIGHT ILIAC ARTERIOGRAM WITH AN ILIAC STENT;   Surgeon: Cephus Shelling, MD;  Location: [facility] OR;  Service: Vascular;  Laterality: Right;   MULTIPLE TOOTH EXTRACTIONS     PATCH ANGIOPLASTY Right 03/18/2020   Procedure: PATCH ANGIOPLASTY using a Livia Snellen BIOLOGIC PATCH OF THE PROFUNDA;  Surgeon: Cephus Shelling, MD;  Location: [facility] OR;  Service: Vascular;  Laterality: Right;   PERIPHERAL VASCULAR CATHETERIZATION N/A 07/16/2016   Procedure: Abdominal Aortogram w/Lower Extremity;  Surgeon: Larina Earthly, MD;  Location: [facility] INVASIVE CV LAB;  Service: Cardiovascular;  Laterality: N/A;   PERIPHERAL VASCULAR INTERVENTION Left 03/09/2020   Procedure: PERIPHERAL VASCULAR INTERVENTION;  Surgeon: Cephus Shelling, MD;  Location: [facility]VAS[facility] CV [facility];  Service: Cardiovascular;  Laterality: Left;  common and external illiac    ROTATOR CUFF REPAIR     right side, had torn ligaments as well   TUBAL LIGATION     VEIN BYPASS SURGERY     left leg   WRIST ARTHROCENTESIS     left - broke in 3 places so has bars and screws placed    FAMILY HISTORY: family history includes Breast cancer in her maternal grandmother; Cervical cancer in her mother; Colon cancer in her maternal aunt; Heart disease in her father and mother; Liver cancer in her father; Lung cancer  in her maternal aunt, maternal aunt, maternal aunt, and maternal uncle; Prostate cancer in her maternal uncle; Stomach cancer in her maternal aunt.  SOCIAL HISTORY:  reports that she has quit smoking. Her smoking use included cigarettes. She has a 28.5 pack-year smoking history. She has been exposed to tobacco smoke. She has never used smokeless tobacco. She reports that she does not drink alcohol and does not use drugs.  ALLERGIES: Bee venom, Insect extract, Farxiga [dapagliflozin], Lyrica [pregabalin], Neurontin [gabapentin], Nsaids, and Latex  MEDICATIONS:  Current Outpatient Medications  Medication Sig Dispense Refill   albuterol (PROVENTIL HFA;VENTOLIN HFA) 108 (90 BASE) MCG/ACT inhaler Inhale 2  puffs into the lungs every 6 (six) hours as needed for wheezing or shortness of breath.     albuterol (PROVENTIL) (2.5 MG/3ML) 0.083% nebulizer solution Take 2.5 mg by nebulization every 6 (six) hours as needed for wheezing or shortness of breath.     alendronate (FOSAMAX) 70 MG tablet Take 70 mg by mouth every Wednesday.     allopurinol (ZYLOPRIM) 300 MG tablet Take 300 mg by mouth every evening.     aspirin EC 81 MG tablet Take 81 mg by mouth in the morning.     atorvastatin (LIPITOR) 80 MG tablet Take 80 mg by mouth at bedtime.     calcium carbonate (ANTACID) 500 MG chewable tablet Chew 1 tablet by mouth 2 (two) times daily as needed for heartburn or indigestion.     Cholecalciferol (VITAMIN D) 125 MCG (5000 UT) CAPS Take 5,000 Units by mouth in the morning.     clopidogrel (PLAVIX) 75 MG tablet Take 1 tablet (75 mg total) by mouth daily. (Patient taking differently: Take 75 mg by mouth at bedtime.) 30 tablet 11   cyanocobalamin (VITAMIN B12) 1000 MCG tablet Take 1,000 mcg by mouth daily.     EPIPEN 2-PAK 0.3 MG/0.3ML SOAJ injection Inject 0.3 mg into the muscle daily as needed for anaphylaxis. Use as directed as needed for allergies.     ezetimibe (ZETIA) 10 MG tablet Take 1 tablet (10 mg total) by mouth daily. (Patient taking differently: Take 10 mg by mouth at bedtime.) 90 tablet 2   furosemide (LASIX) 40 MG tablet Take 1 tablet (40 mg total) by mouth daily. 90 tablet 3   lactulose (CHRONULAC) 10 GM/15ML solution Take 20 g by mouth 2 (two) times daily as needed for moderate constipation.     melatonin 5 MG TABS Take 5 mg by mouth at bedtime.     Menthol-Methyl Salicylate (MUSCLE RUB) 10-15 % CREA Apply 1 application topically daily as needed (for arthritis pain).     nitroGLYCERIN (NITROSTAT) 0.4 MG SL tablet Place 1 tablet (0.4 mg total) under the tongue every 5 (five) minutes x 3 doses as needed for chest pain. 25 tablet 4   Oxycodone HCl 10 MG TABS Take 1 tablet (10 mg total) by mouth 4  (four) times daily as needed (for pain). 20 tablet 0   pantoprazole (PROTONIX) 40 MG tablet Take 40 mg by mouth 2 (two) times daily.     potassium chloride SA (KLOR-CON M20) 20 MEQ tablet Take 1 tablet (20 mEq total) by mouth daily. 90 tablet 1   predniSONE (DELTASONE) 10 MG tablet 4 tabs daily for 3 days 3 tabs daily for 3 days 2 tabs daily for 3 days 1 tab daily for 3 days 30 tablet 0   senna-docusate (SENOKOT-S) 8.6-50 MG tablet Take 2 tablets by mouth at bedtime.     SPIRIVA  RESPIMAT 2.5 MCG/ACT AERS Take 2 puffs by mouth daily.     spironolactone (ALDACTONE) 25 MG tablet Take 1 tablet (25 mg total) by mouth daily. 90 tablet 3   verapamil (CALAN-SR) 240 MG CR tablet Take 1 tablet (240 mg total) by mouth daily. 90 tablet 2   No current facility-administered medications for this encounter.   Facility-Administered Medications Ordered in Other Encounters  Medication Dose Route Frequency Provider Last Rate Last Admin   DOBUTamine (DOBUTREX) 1,000 mcg/mL in dextrose 5% 250 mL infusion  30 mcg/kg/min Intravenous Titrated Lars Masson, MD        REVIEW OF SYSTEMS:  Notable for that above.   PHYSICAL EXAM:  weight is 130 lb 4 oz (59.1 kg). Her oral temperature is 98 F (36.7 C). Her blood pressure is 152/94 (abnormal) and her pulse is 84. Her respiration is 18 and oxygen saturation is 97%.   General: Alert and oriented, in no acute distress HEENT: Head is normocephalic. Extraocular movements are intact. Oropharynx is notable for no lesions. Neck: Neck is notable for no masses Heart: Regular in rate and rhythm with no murmurs, rubs, or gallops. Chest: Clear to auscultation bilaterally, with no rhonchi, wheezes, or rales. Abdomen: Soft,  nontender, somewhat distended, with no rigidity or guarding. Extremities: mild swelling in LE's  Lymphatics: see Neck Exam Skin: No concerning lesions over neck. Musculoskeletal: symmetric strength and muscle tone throughout. Neurologic: Cranial nerves  II through XII are grossly intact. No obvious focalities. Speech is fluent. Coordination is intact. Psychiatric: Judgment and insight are intact. Affect is appropriate.   ECOG = 1  0 - Asymptomatic (Fully active, able to carry on all predisease activities without restriction)  1 - Symptomatic but completely ambulatory (Restricted in physically strenuous activity but ambulatory and able to carry out work of a light or sedentary nature. For example, light housework, office work)  2 - Symptomatic, <50% in bed during the day (Ambulatory and capable of all self care but unable to carry out any work activities. Up and about more than 50% of waking hours)  3 - Symptomatic, >50% in bed, but not bedbound (Capable of only limited self-care, confined to bed or chair 50% or more of waking hours)  4 - Bedbound (Completely disabled. Cannot carry on any self-care. Totally confined to bed or chair)  5 - Death   Santiago Glad MM, Creech RH, Tormey DC, et al. [phone]). "Toxicity and response criteria of the [facility]". Am. Evlyn Clines. Oncol. 5 (6): 649-55   LABORATORY DATA:  Lab Results  Component Value Date   WBC 7.1 04/10/2023   HGB 12.3 04/10/2023   HCT 36.8 04/10/2023   MCV 99.2 04/10/2023   PLT 250 04/10/2023   CMP     Component Value Date/Time   NA 133 (L) 04/11/2023 0733   NA 133 (L) 04/17/2022 0740   K 4.7 04/11/2023 0733   CL 98 04/11/2023 0733   CO2 24 04/11/2023 0733   GLUCOSE 122 (H) 04/11/2023 0733   BUN 23 04/11/2023 0733   BUN 20 04/17/2022 0740   CREATININE 0.74 04/11/2023 0733   CREATININE 0.83 02/22/2016 1007   CALCIUM 8.8 (L) 04/11/2023 0733   PROT 6.4 (L) 04/06/2023 1755   PROT 7.7 07/01/2020 1141   ALBUMIN 3.0 (L) 04/08/2023 0655   ALBUMIN 4.3 07/01/2020 1141   AST 23 04/06/2023 1755   ALT 20 04/06/2023 1755   ALKPHOS 47 04/06/2023 1755   BILITOT 0.7 04/06/2023 1755  BILITOT 0.4 07/01/2020 1141   GFRNONAA >60 04/11/2023 0733   GFRAA 85 07/01/2020  1141      Lab Results  Component Value Date   TSH 0.964 07/01/2020     RADIOGRAPHY: NM PET Image Initial (PI) Skull Base To Thigh (F-18 FDG)  Result Date: 04/26/2023 CLINICAL DATA:  Initial treatment strategy for laryngeal cancer. EXAM: NUCLEAR MEDICINE PET SKULL BASE TO THIGH TECHNIQUE: 6.1 mCi F-18 FDG was injected intravenously. Full-ring PET imaging was performed from the skull base to thigh after the radiotracer. CT data was obtained and used for attenuation correction and anatomic localization. Fasting blood glucose: 108 mg/dl COMPARISON:  Neck CT 69/62/9528. Chest CTA 04/06/2023. No prior PET-CT. FINDINGS: Mediastinal blood pool activity: SUV max 2.0 Liver activity: SUV max 3.0 NECK: Hypermetabolism is noted in the region of the larynx bilaterally (left-greater-than-right) (SUVmax = 7.9 on the left and 4.6 on the right), without a well-defined soft tissue mass identified on CT images. Asymmetric hypermetabolism is also noted in the left longus coli muscle, and in the left scalene muscle, presumably physiologic. Incidental CT findings: None. CHEST: Peripheral nodular area of architectural distortion in the inferior segment of the lingula (axial image 90 of series 4) measuring approximately 1.8 x 1.0 cm which demonstrates low-level metabolic activity (SUVmax = 4.4). The appearance of this region is similar to prior examinations on CT imaging. Incidental CT findings: Diffuse bronchial wall thickening with mild centrilobular and paraseptal emphysema. Scattered calcified granulomas are incidentally noted. Atherosclerosis of the thoracic aorta as well as the left main, left anterior descending, left circumflex and right coronary arteries. Aneurysmal dilatation of the distal descending thoracic aorta which measures approximately 4.5 x 4.1 cm shortly above the level of the aortic hiatus. ABDOMEN/PELVIS: No abnormal hypermetabolic activity within the liver, pancreas, adrenal glands, or spleen. No  hypermetabolic lymph nodes in the abdomen or pelvis. Incidental CT findings: Extensive atherosclerosis of the abdominal aorta and pelvic vasculature. Numerous calcified granulomas in the liver and spleen. Tiny calcified gallstones in the fundus of the gallbladder. SKELETON: Muscular activity in the left forearm, presumably physiologic. No focal hypermetabolic activity to suggest skeletal metastasis. Incidental CT findings: Status post bilateral hip arthroplasty. IMPRESSION: 1. Asymmetric laryngeal activity (left-greater-than-right), corresponding to biopsy proven neoplasm. No definite signs of metastatic disease noted on today's examination in the neck, chest, abdomen or pelvis. 2. Nodular area of architectural distortion in the inferior segment of the lingula, similar to prior examinations, likely an area of chronic post infectious or inflammatory scarring. Mild hypermetabolism in this region could suggest active inflammation. Strictly speaking a metastatic lesion is difficult to entirely exclude, but is not favored on the basis of today's examination, particularly in light of the appearance on prior studies. Attention on any future follow-up chest CT is recommended to ensure continued stability. 3. Aortic atherosclerosis, in addition to left main and three-vessel coronary artery disease. There is also aneurysmal dilatation of the distal descending thoracic aorta which measures up to 4.5 x 4.1 cm. Recommend semi-annual imaging followup by CTA or MRA and referral to cardiothoracic surgery if not already obtained. This recommendation follows 2010 ACCF/AHA/AATS/ACR/ASA/SCA/SCAI/SIR/STS/SVM Guidelines for the Diagnosis and Management of Patients With Thoracic Aortic Disease. Circulation. 2010; 121: U132-G401. Aortic aneurysm NOS (ICD10-I71.9) 4. Diffuse bronchial wall thickening with mild centrilobular and paraseptal emphysema; imaging findings suggestive of underlying COPD. 5. Cholelithiasis without evidence of acute  cholecystitis. Electronically Signed   By: Trudie Reed M.D.   On: 04/26/2023 13:05   CT CHEST LUNG CA  SCREEN LOW DOSE W/O CM  Addendum Date: 04/11/2023   ADDENDUM REPORT: 04/11/2023 18:01 ADDENDUM: Addition to impression section: Moderate bilateral bronchial wall thickening and scattered areas of mucous plugging, findings can be seen in the setting of bronchitis. Electronically Signed   By: Allegra Lai M.D.   On: 04/11/2023 18:01   Result Date: 04/11/2023 CLINICAL DATA:  Current smoker with greater than 20 pack-year history EXAM: CT CHEST WITHOUT CONTRAST LOW-DOSE FOR LUNG CANCER SCREENING TECHNIQUE: Multidetector CT imaging of the chest was performed following the standard protocol without IV contrast. RADIATION DOSE REDUCTION: This exam was performed according to the departmental dose-optimization program which includes automated exposure control, adjustment of the mA and/or kV according to patient size and/or use of iterative reconstruction technique. COMPARISON:  Chest CTA dated March 15, 2023; lung cancer screening CT dated February 17, 2021 FINDINGS: Cardiovascular: Heart is upper limits of normal in size. Severe coronary artery calcifications. Unchanged focal outpouching of the left-sided aortic arch. Partially visualized aneurysmal dilation of the descending thoracic aorta measuring up to 4.5 cm, unchanged. Severe coronary artery calcifications. Mediastinum/Nodes: Esophagus and thyroid are unremarkable. No enlarged lymph nodes seen in the chest. Calcified mediastinal lymph nodes, likely sequela of prior granulomatous infection. Lungs/Pleura: Central airways are patent. Moderate bilateral bronchial wall thickening with scattered areas of mucous plugging. Severe centrilobular emphysema. Bilateral lower lobe atelectasis. No pleural effusion or pneumothorax. Scattered new small solid pulmonary nodules. Reference nodule of the right lower lobe measuring 3.8 mm on 174. A previously measured right lower  lobe pulmonary nodule has resolved. Other pulmonary nodules are stable. Upper Abdomen: Splenic calcifications, likely sequela of prior granulomatous infection. Diverticulosis. No acute abnormality. Musculoskeletal: No aggressive appearing osseous lesions. IMPRESSION: 1. Lung-RADS 2, benign appearance or behavior. Continue annual screening with low-dose chest CT without contrast in 12 months. 2. 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 3. 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 4. Aortic Atherosclerosis (ICD10-I70.0) and Emphysema (ICD10-J43.9). Electronically Signed: By: Allegra Lai M.D. On: 04/11/2023 17:56   CT SOFT TISSUE NECK W CONTRAST  Result Date: 04/11/2023 CLINICAL DATA:  Metastatic disease evaluation. EXAM: CT NECK WITH CONTRAST TECHNIQUE: Multidetector CT imaging of the neck was performed using the standard protocol following the bolus administration of intravenous contrast. RADIATION DOSE REDUCTION: This exam was performed according to the departmental dose-optimization program which includes automated exposure control, adjustment of the mA and/or kV according to patient size and/or use of iterative reconstruction technique. CONTRAST:  75mL OMNIPAQUE IOHEXOL 350 MG/ML SOLN COMPARISON:  None Available. FINDINGS: Pharynx and larynx: There is a opacification of the olfactory clefts bilaterally (5-86). The nasal cavity and nasopharynx are otherwise unremarkable. The palatine tonsils are prominent, right more than left, with right-sided tonsilliths. There is no suspicious lesion or abnormal enhancement. The parapharyngeal spaces are clear. The glottis is closed at the time of imaging. There is no definite mass lesion or abnormal enhancement. The epiglottis is normal. There is no retropharyngeal fluid collection. Salivary glands: The parotid and submandibular glands are unremarkable. Thyroid: Small thyroid  nodules are noted, evaluated on prior thyroid ultrasound. Lymph nodes: There is no pathologic lymphadenopathy in the neck. Vascular: There is calcified plaque at the bilateral carotid bifurcations, possible hemodynamically significant on both sides. The vertebral arteries appear patent. The internal jugular veins are patent. Limited intracranial: There are small age-indeterminate infarcts in the right cerebellar hemisphere. Visualized orbits: A left lens implant is noted. The globes  and orbits are otherwise unremarkable. Mastoids and visualized paranasal sinuses: There is mild mucosal thickening in the right frontoethmoidal recess. There is a trace right mastoid effusion. The middle ear cavities are clear. Skeleton: There is no acute osseous abnormality or suspicious osseous lesion. Upper chest: Assessed on the same-day CT chest. Other: None. IMPRESSION: 1. No evidence of primary malignancy or metastatic disease in the neck. 2. 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 3. Calcified plaque at the bilateral carotid bifurcations, possibly hemodynamically significant. 4. 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 5. CT chest reported separately. Electronically Signed   By: Lesia Hausen M.D.   On: 04/11/2023 15:58   CARDIAC CATHETERIZATION  Result Date: 04/08/2023   Dist LM to Mid LAD lesion is 15% stenosed.  Calcified   Mid LAD lesion is 40% stenosed.  Calcified   1st Mrg lesion is 5% stenosed.  Calcified   LV end diastolic pressure is normal.   Recommend Aspirin 81mg  daily for moderate CAD.  POST-OPERATIVE DIAGNOSIS:  Diffuse Coronary Calcification with only mild-moderate non-obstructive disease - NO Culprit Lesion to explain + Troponin -- DEMAND ISCHEMIA not NSTEMI Normal LVEDP ~11 mmHg RECOMMENDATIONS   Anticipated discharge date to be determined.   Treat underlying cause-COPD exacerbation   Recommend Aspirin 81mg  daily for  moderate CAD. Bryan Lemma, MD\  ECHOCARDIOGRAM COMPLETE  Result Date: 04/07/2023    ECHOCARDIOGRAM REPORT   Patient Name:   LATONGA WERTHEIM Date of Exam: 04/07/2023 Medical Rec #:  [id number]      Height:       67.0 in Accession #:    [id number]     Weight:       128.4 lb Date of Birth:  1951-01-28      BSA:          1.675 m Patient Age:    71 years       BP:           138/73 mmHg Patient Gender: F              HR:           75 bpm. Exam Location:  Inpatient Procedure: 2D Echo, Color Doppler and Cardiac Doppler Indications:    Elevated Troponin  History:        Patient has prior history of Echocardiogram examinations, most                 recent 03/10/2020. CAD; COPD.  Sonographer:    Darlys Gales Referring Phys: [phone] JARED M GARDNER IMPRESSIONS  1. Small region of apical hypokinesis/akinesis. Basal inferior hypokinesis. . Left ventricular ejection fraction, by estimation, is 55 to 60%. The left ventricle has normal function.  2. Right ventricular systolic function is normal. The right ventricular size is normal.  3. Left atrial size was mildly dilated.  4. The mitral valve is normal in structure. Mild mitral valve regurgitation.  5. The aortic valve is tricuspid. Aortic valve regurgitation is not visualized. Aortic valve sclerosis is present, with no evidence of aortic valve stenosis. FINDINGS  Left Ventricle: Small region of apical hypokinesis/akinesis. Basal inferior hypokinesis. Left ventricular ejection fraction, by estimation, is 55 to 60%. The left ventricle has normal function. The left ventricular internal cavity size was normal in size. There is no left ventricular hypertrophy. Right Ventricle: The right ventricular size is normal. Right vetricular wall thickness was not assessed. Right ventricular systolic function is normal. Left Atrium: Left atrial size  was mildly dilated. Right Atrium: Right atrial size was normal in size. Pericardium: Trivial pericardial effusion is present. Mitral Valve: The mitral  valve is normal in structure. There is mild thickening of the mitral valve leaflet(s). Mild mitral annular calcification. Mild mitral valve regurgitation. MV peak gradient, 5.3 mmHg. The mean mitral valve gradient is 3.0 mmHg. Tricuspid Valve: The tricuspid valve is normal in structure. Tricuspid valve regurgitation is trivial. Aortic Valve: The aortic valve is tricuspid. Aortic valve regurgitation is not visualized. Aortic valve sclerosis is present, with no evidence of aortic valve stenosis. Aortic valve mean gradient measures 3.0 mmHg. Aortic valve peak gradient measures 5.5  mmHg. Aortic valve area, by VTI measures 3.00 cm. Pulmonic Valve: The pulmonic valve was not well visualized. Pulmonic valve regurgitation is not visualized. Aorta: The aortic root is normal in size and structure. IAS/Shunts: No atrial level shunt detected by color flow Doppler.  LEFT VENTRICLE PLAX 2D LVIDd:         4.50 cm   Diastology LVIDs:         2.80 cm   LV e' medial:    5.33 cm/s LV PW:         0.80 cm   LV E/e' medial:  19.3 LV IVS:        0.80 cm   LV e' lateral:   4.68 cm/s LVOT diam:     1.80 cm   LV E/e' lateral: 22.0 LV SV:         67 LV SV Index:   40 LVOT Area:     2.54 cm  RIGHT VENTRICLE RV S prime:     9.25 cm/s TAPSE (M-mode): 1.9 cm LEFT ATRIUM             Index        RIGHT ATRIUM          Index LA Vol (A2C):   53.9 ml 32.18 ml/m  RA Area:     9.48 cm LA Vol (A4C):   54.9 ml 32.77 ml/m  RA Volume:   15.90 ml 9.49 ml/m LA Biplane Vol: 56.6 ml 33.79 ml/m  AORTIC VALVE AV Area (Vmax):    2.44 cm AV Area (Vmean):   2.48 cm AV Area (VTI):     3.00 cm AV Vmax:           117.00 cm/s AV Vmean:          83.600 cm/s AV VTI:            0.223 m AV Peak Grad:      5.5 mmHg AV Mean Grad:      3.0 mmHg LVOT Vmax:         112.00 cm/s LVOT Vmean:        81.500 cm/s LVOT VTI:          0.263 m LVOT/AV VTI ratio: 1.18  AORTA Ao Root diam: 2.30 cm MITRAL VALVE MV Area (PHT): 3.61 cm     SHUNTS MV Area VTI:   2.60 cm      Systemic VTI:  0.26 m MV Peak grad:  5.3 mmHg     Systemic Diam: 1.80 cm MV Mean grad:  3.0 mmHg MV Vmax:       1.15 m/s MV Vmean:      85.5 cm/s MV Decel Time: 210 msec MV E velocity: 103.00 cm/s MV A velocity: 109.00 cm/s MV E/A ratio:  0.94 Dietrich Pates MD Electronically signed by Dietrich Pates MD Signature  Date/Time: 04/07/2023/3:27:13 PM    Final    CT Angio Chest PE W and/or Wo Contrast  Result Date: 04/06/2023 CLINICAL DATA:  Positive D-dimer and shortness of breath, initial encounter EXAM: CT ANGIOGRAPHY CHEST WITH CONTRAST TECHNIQUE: Multidetector CT imaging of the chest was performed using the standard protocol during bolus administration of intravenous contrast. Multiplanar CT image reconstructions and MIPs were obtained to evaluate the vascular anatomy. RADIATION DOSE REDUCTION: This exam was performed according to the departmental dose-optimization program which includes automated exposure control, adjustment of the mA and/or kV according to patient size and/or use of iterative reconstruction technique. CONTRAST:  75mL OMNIPAQUE IOHEXOL 350 MG/ML SOLN COMPARISON:  Chest x-ray from earlier in the same day, CT from 04/20/2022 FINDINGS: Cardiovascular: Atherosclerotic calcifications of the thoracic aorta are noted. No ascending aneurysmal dilatation is seen. No significant aortic opacification is noted to suggest dissection. Descending aorta demonstrates stable dilatation at the level of the aortic hiatus measuring up to 4.5 cm. This is relatively stable from the prior exam. Heart is mildly enlarged in size. The pulmonary artery shows a normal branching pattern bilaterally. No filling defect to suggest pulmonary embolism is noted. Scattered coronary calcifications are noted. 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 Mild contour deformity is noted suggesting stability. Mediastinum/Nodes: Thoracic  inlet is within normal limits. No sizable hilar or mediastinal adenopathy is noted. Calcified lymph nodes are noted within the hila and mediastinum consistent with prior granulomatous disease. Lungs/Pleura: Lungs are well aerated bilaterally. Scattered calcified granulomas are seen. Mild emphysematous change is noted. Mild right basilar atelectasis is noted. Upper Abdomen: Visualized upper abdomen shows calcified splenic granulomas. No other focal abnormality is seen. Vascular calcifications are noted. Musculoskeletal: No acute rib abnormality is noted. Degenerative changes of the thoracic spine are seen. No compression deformity is noted. Review of the MIP images confirms the above findings. IMPRESSION: No evidence of pulmonary emboli. 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 The overall morphology of the aorta is stable however. Changes consistent with prior granulomatous disease. Aortic Atherosclerosis (ICD10-I70.0) and Emphysema (ICD10-J43.9). Electronically Signed   By: Alcide Clever M.D.   On: 04/06/2023 20:40   DG Chest Port 1 View  Result Date: 04/06/2023 CLINICAL DATA:  Shortness of breath EXAM: PORTABLE CHEST 1 VIEW COMPARISON:  Chest x-ray 03/18/2019 FINDINGS: There some atelectasis in the lung bases. There is a small left pleural effusion. There is no pneumothorax or acute fracture. The heart is mildly enlarged, unchanged. IMPRESSION: 1. Small left pleural effusion. 2. Bibasilar atelectasis. Electronically Signed   By: Darliss Cheney M.D.   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 I personally discussed her case with Dr. 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  I recommend definitve radiotherapy for this patient. She is not enthusiastic about  the alternative (surgery). We will schedule her for RT planning in 1wk to give her time to reflect and discuss more w/ her family. She and her daughter like this plan. Anticipate 7 wks of treatment (hypofractionation was also discussed due to her age/medical issues, but could cause more scarring long term in the throat. She prefers the conventional 7 week regimen).  We discussed the potential risks, benefits, and side effects of radiotherapy. We talked in detail about acute and late effects. We discussed that some of the most bothersome acute effects may be mucositis, dysgeusia, salivary changes, skin irritation, hair loss, dehydration, weight loss and fatigue. We talked about late effects which include but are not necessarily limited to dysphagia, hypothyroidism, nerve injury, vascular injury, spinal cord injury, xerostomia, trismus, neck edema, dental issues, non-healing wound, and potentially fatal injury to any of the tissues in the head and neck region. No guarantees of treatment were given. A consent form was signed and placed in the patient's medical record. The patient is enthusiastic about proceeding with treatment. I look forward to participating in the patient's care.    Simulation (treatment planning) will take place in 1 wk  We also discussed that the treatment of head and neck cancer is a multidisciplinary process to maximize treatment outcomes and quality of life. For this reason the following referrals have been or will be made: Nutritionist for nutrition support during and after treatment.   Speech language pathology for swallowing and/or speech therapy.    Social work for social support.     Physical therapy due to risk of lymphedema in neck and deconditioning.     Baseline labs including TSH.  On date of service, in total, I spent 70 minutes on this encounter. Patient was seen in person.  __________________________________________   Lonie Peak, MD  This document  serves as a record of services personally performed by Lonie Peak, MD. It was created on her behalf by Neena Rhymes, a trained medical scribe. The creation of this record is based on the scribe's personal observations and the provider's statements to them. This document has been checked and approved by the attending provider.

## 2023-05-02 NOTE — Telephone Encounter (Signed)
RN called pt for meaningful use and nurse evaluation information. Consult note completed and routed to Dr. Basilio Cairo for review.

## 2023-05-03 ENCOUNTER — Telehealth: Payer: Self-pay

## 2023-05-03 ENCOUNTER — Other Ambulatory Visit: Payer: Self-pay

## 2023-05-03 ENCOUNTER — Encounter: Payer: Self-pay | Admitting: Radiation Oncology

## 2023-05-03 ENCOUNTER — Ambulatory Visit
Admission: RE | Admit: 2023-05-03 | Discharge: 2023-05-03 | Disposition: A | Discharge status: Home / Self Care | Payer: Medicare Other | Source: Ambulatory Visit | Attending: Radiation Oncology | Admitting: Radiation Oncology

## 2023-05-03 ENCOUNTER — Ambulatory Visit: Admission: RE | Admit: 2023-05-03 | Payer: Medicare Other | Source: Ambulatory Visit | Admitting: Radiation Oncology

## 2023-05-03 VITALS — BP 152/94 | HR 84 | Temp 98.0°F | Resp 18 | Wt 130.2 lb

## 2023-05-03 DIAGNOSIS — C320 Malignant neoplasm of glottis: Secondary | ICD-10-CM | POA: Diagnosis not present

## 2023-05-03 DIAGNOSIS — I739 Peripheral vascular disease, unspecified: Secondary | ICD-10-CM | POA: Insufficient documentation

## 2023-05-03 DIAGNOSIS — E785 Hyperlipidemia, unspecified: Secondary | ICD-10-CM | POA: Diagnosis not present

## 2023-05-03 DIAGNOSIS — E042 Nontoxic multinodular goiter: Secondary | ICD-10-CM | POA: Diagnosis not present

## 2023-05-03 DIAGNOSIS — I700 Atherosclerosis of aorta: Secondary | ICD-10-CM | POA: Diagnosis not present

## 2023-05-03 DIAGNOSIS — C321 Malignant neoplasm of supraglottis: Secondary | ICD-10-CM | POA: Insufficient documentation

## 2023-05-03 DIAGNOSIS — I509 Heart failure, unspecified: Secondary | ICD-10-CM | POA: Diagnosis not present

## 2023-05-03 DIAGNOSIS — I2510 Atherosclerotic heart disease of native coronary artery without angina pectoris: Secondary | ICD-10-CM | POA: Insufficient documentation

## 2023-05-03 DIAGNOSIS — Z8711 Personal history of peptic ulcer disease: Secondary | ICD-10-CM | POA: Diagnosis not present

## 2023-05-03 DIAGNOSIS — J441 Chronic obstructive pulmonary disease with (acute) exacerbation: Secondary | ICD-10-CM | POA: Insufficient documentation

## 2023-05-03 DIAGNOSIS — Z7902 Long term (current) use of antithrombotics/antiplatelets: Secondary | ICD-10-CM | POA: Insufficient documentation

## 2023-05-03 DIAGNOSIS — I7123 Aneurysm of the descending thoracic aorta, without rupture: Secondary | ICD-10-CM | POA: Insufficient documentation

## 2023-05-03 DIAGNOSIS — K219 Gastro-esophageal reflux disease without esophagitis: Secondary | ICD-10-CM | POA: Diagnosis not present

## 2023-05-03 DIAGNOSIS — I110 Hypertensive heart disease with heart failure: Secondary | ICD-10-CM | POA: Diagnosis not present

## 2023-05-03 DIAGNOSIS — I083 Combined rheumatic disorders of mitral, aortic and tricuspid valves: Secondary | ICD-10-CM | POA: Insufficient documentation

## 2023-05-03 DIAGNOSIS — G629 Polyneuropathy, unspecified: Secondary | ICD-10-CM | POA: Diagnosis not present

## 2023-05-03 DIAGNOSIS — J432 Centrilobular emphysema: Secondary | ICD-10-CM | POA: Insufficient documentation

## 2023-05-03 DIAGNOSIS — Z87891 Personal history of nicotine dependence: Secondary | ICD-10-CM | POA: Insufficient documentation

## 2023-05-03 DIAGNOSIS — Z79899 Other long term (current) drug therapy: Secondary | ICD-10-CM | POA: Diagnosis not present

## 2023-05-03 DIAGNOSIS — C329 Malignant neoplasm of larynx, unspecified: Secondary | ICD-10-CM

## 2023-05-03 NOTE — Telephone Encounter (Signed)
Rn pt called daughter to give available ct sim times for Friday 05-10-23. Pt daughter chose the 9am on slot on 8-23. Rn will message Ct sim to let them know.

## 2023-05-03 NOTE — Progress Notes (Signed)
Oncology Nurse Navigator Documentation   Met with patient during initial consult with Dr. Basilio Cairo. She was accompanied by her daughter.  Further introduced myself as her/their Navigator, explained my role as a member of the Care Team. Provided New Patient resource guide binder: Contact information for physicians, this navigator, other members of the Care Team Advance Directive information; provided [facility] AD booklet at their request,  Fall Prevention Patient Safety Plan Financial Assistance Information sheet Symptom Management Clinic information [facility] campus map with highlight of [facility] SLP Information sheet Head and Neck cancer basics Nutrition information Patient and family support information including Spiritual care/Chaplain information, Peer mentor program, health and wellness classes, and the survivorship program Community resources  Provided and discussed educational handouts for PEG and PAC. Assisted with post-consult appt scheduling. They know that I will call them for a Radiation planning appointment/CT simulation.    They verbalized understanding of information provided. I encouraged them to call with questions/concerns moving forward.  Hedda Slade, RN, BSN, OCN Head & Neck Oncology Nurse Navigator [facility] [place] [phone]

## 2023-05-10 ENCOUNTER — Other Ambulatory Visit: Payer: Self-pay

## 2023-05-10 ENCOUNTER — Ambulatory Visit
Admission: RE | Admit: 2023-05-10 | Discharge: 2023-05-10 | Disposition: A | Discharge status: Home / Self Care | Payer: Medicare Other | Source: Ambulatory Visit | Attending: Radiation Oncology | Admitting: Radiation Oncology

## 2023-05-10 DIAGNOSIS — Z9981 Dependence on supplemental oxygen: Secondary | ICD-10-CM | POA: Diagnosis not present

## 2023-05-10 DIAGNOSIS — C320 Malignant neoplasm of glottis: Secondary | ICD-10-CM | POA: Diagnosis not present

## 2023-05-10 DIAGNOSIS — C329 Malignant neoplasm of larynx, unspecified: Secondary | ICD-10-CM

## 2023-05-10 DIAGNOSIS — C321 Malignant neoplasm of supraglottis: Secondary | ICD-10-CM | POA: Diagnosis not present

## 2023-05-10 DIAGNOSIS — R0609 Other forms of dyspnea: Secondary | ICD-10-CM | POA: Diagnosis not present

## 2023-05-10 DIAGNOSIS — G629 Polyneuropathy, unspecified: Secondary | ICD-10-CM | POA: Insufficient documentation

## 2023-05-10 LAB — TSH: TSH: 0.624 u[IU]/mL (ref 0.350–4.500)

## 2023-05-10 NOTE — Progress Notes (Signed)
Oncology Nurse Navigator Documentation   To provide support, encouragement and care continuity, met with Marie Johnson and her daughter after her CT SIM.  She tolerated procedure without difficulty, denied questions/concerns.   I encouraged them to call me prior to 05/22/23 [place] with any questions or concern.  Hedda Slade RN, BSN, OCN Head & Neck Oncology Nurse Navigator [facility] at [facility] Phone # [phone]  Fax # [phone]

## 2023-05-13 DIAGNOSIS — C329 Malignant neoplasm of larynx, unspecified: Secondary | ICD-10-CM | POA: Diagnosis not present

## 2023-05-13 DIAGNOSIS — J441 Chronic obstructive pulmonary disease with (acute) exacerbation: Secondary | ICD-10-CM | POA: Diagnosis not present

## 2023-05-15 ENCOUNTER — Other Ambulatory Visit: Payer: Self-pay

## 2023-05-15 DIAGNOSIS — C329 Malignant neoplasm of larynx, unspecified: Secondary | ICD-10-CM

## 2023-05-16 ENCOUNTER — Inpatient Hospital Stay: Payer: Medicare Other | Attending: Radiation Oncology

## 2023-05-16 NOTE — Progress Notes (Signed)
[facility] Clinical Social Work  Clinical Social Work was referred by Statistician for assessment of psychosocial needs.  Clinical Social Worker contacted patient by phone to offer support and assess for needs. CSW was unable to complete full assessment due to patient not feeling well. CSW completed the SDOH screenings: transportation, medical bills, and utilities are two needs identified by patient. Patient has transportation to appointments, but expressed concerns about gas. Patient reported concerns about financials related to utlitlity, CSW will provided information about alight grant at [facility] clinic. Patient has been working with a DSS case Production designer, theatre/television/film on medical bills, will discuss with CSW at [facility] clinic more in depth  Marguerita Merles, LCSWA  Clinical Social Worker [facility]

## 2023-05-21 ENCOUNTER — Ambulatory Visit
Admission: RE | Admit: 2023-05-21 | Discharge: 2023-05-21 | Disposition: A | Discharge status: Home / Self Care | Payer: Medicare Other | Source: Ambulatory Visit | Attending: Radiation Oncology | Admitting: Radiation Oncology

## 2023-05-21 DIAGNOSIS — C320 Malignant neoplasm of glottis: Secondary | ICD-10-CM | POA: Insufficient documentation

## 2023-05-21 DIAGNOSIS — C321 Malignant neoplasm of supraglottis: Secondary | ICD-10-CM | POA: Diagnosis not present

## 2023-05-22 ENCOUNTER — Other Ambulatory Visit: Payer: Self-pay

## 2023-05-22 ENCOUNTER — Telehealth: Payer: Self-pay | Admitting: Cardiology

## 2023-05-22 ENCOUNTER — Encounter: Payer: Self-pay | Admitting: Cardiovascular Disease

## 2023-05-22 DIAGNOSIS — C320 Malignant neoplasm of glottis: Secondary | ICD-10-CM | POA: Diagnosis not present

## 2023-05-22 DIAGNOSIS — C321 Malignant neoplasm of supraglottis: Secondary | ICD-10-CM | POA: Diagnosis not present

## 2023-05-22 DIAGNOSIS — Z510 Encounter for antineoplastic radiation therapy: Secondary | ICD-10-CM | POA: Diagnosis not present

## 2023-05-22 LAB — RAD ONC ARIA SESSION SUMMARY
Course Elapsed Days: 0
Plan Fractions Treated to Date: 1
Plan Prescribed Dose Per Fraction: 2 Gy
Plan Total Fractions Prescribed: 35
Plan Total Prescribed Dose: 70 Gy
Reference Point Dosage Given to Date: 2 Gy
Reference Point Session Dosage Given: 2 Gy
Session Number: 1

## 2023-05-22 NOTE — Telephone Encounter (Signed)
Call sent straight through triage. Patient and her daughter a in the car driving to her radiation appointment. Patient is having worsening SOB with activity and BLE edema. Patient is having to use her oxygen more often. Encouraged patient to go to ED to get evaluated.

## 2023-05-22 NOTE — Telephone Encounter (Signed)
Pt's daughter giving the nurse a callback regarding scheduling an appt for tomorrow. Please advise

## 2023-05-22 NOTE — Telephone Encounter (Signed)
   Per triage note today, patient and daughter called triage line today to report worsening SOB with activity, increased O2 requirements, and BLE edema. I did not personally speak with patient but agree with recommendation to be evaluated in the ED. If patient declines, please arrange acute OV with first available appointment. Would be appropriate for DOD spot.   Perlie Gold, PA-C

## 2023-05-22 NOTE — Telephone Encounter (Signed)
Called patient and offered her DOD appointment tomorrow.

## 2023-05-22 NOTE — Progress Notes (Unsigned)
  Cardiology Office Note:  .   Date:  05/23/2023  ID:  Marie Johnson, DOB [birth year], MRN [id number] PCP: Ailene Ravel, MD  [facility] Providers Cardiologist:  Shari Prows ( retired), now Mudlogger   Cardiology APP:  Beatrice Lecher, PA-C  Electrophysiologist:  Hillis Range, MD (Inactive)    History of Present Illness: .   Marie Johnson is a 72 y.o. 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 Chestine Spore at [facility]   She is here to discuss the leg edema and worsening dyspnea   Still eating processed foods and uses some salt  Is on lasix 40 mg a day   Cath on July 22 shows mild , non obstructive CAD  LVEDP was 11  Echo shows normal LV systolic function ejection fraction is 55 to 60%.  RV size is normal. Trivial tricuspid regurgitation    ROS:   Studies Reviewed: .         Risk Assessment/Calculations:             Physical Exam:   VS:  BP (!) 134/54   Pulse (!) 101   Ht 5' 7.5" (1.715 m)   Wt 133 lb 12.8 oz (60.7 kg)   SpO2 99% Comment: 2L of o2  BMI 20.65 kg/m    Wt Readings from Last 3 Encounters:  05/23/23 133 lb 12.8 oz (60.7 kg)  05/02/23 130 lb 4 oz (59.1 kg)  04/11/23 122 lb (55.3 kg)    GEN: elderly female,  frail  in no acute distress NECK: No JVD; No carotid bruits CARDIAC: RR  RESPIRATORY:  bilateral wheezing ,  few rales  ABDOMEN: Soft, non-tender, non-distended EXTREMITIES:   1-2 + pitting edema  No deformity   ASSESSMENT AND PLAN: .    Leg edema : Likely worsened by her COPD and her inactivity.  Will increase her Lasix to 40 mg twice a day for the next 3  to 4 days.  Will also increase her potassium chloride to 20 mEq twice a day for the next 3 or 4 days.   She still eats a fair amount of processed meats.  Of asked her to limit her processed meat and her intake of salt.  Will double her Lasix and potassium for the next 3 to 4 days.  She will then continue to take an extra Lasix and potassium tablets on Mondays, Wednesdays, Fridays.  Will recheck a basic metabolic profile in 2 weeks.  She will see an APP in 6 weeks.        Dispo:   Signed, Kristeen Miss, MD

## 2023-05-22 NOTE — Telephone Encounter (Signed)
Pt c/o swelling: STAT is pt has developed SOB within 24 hours  How much weight have you gained and in what time span? Not sure   If swelling, where is the swelling located? Legs and feet  Are you currently taking a fluid pill? yes  Are you currently SOB? yes  Do you have a log of your daily weights (if so, list)? no  Have you gained 3 pounds in a day or 5 pounds in a week? yes  Have you traveled recently? no

## 2023-05-23 ENCOUNTER — Encounter: Payer: Self-pay | Admitting: Radiation Oncology

## 2023-05-23 ENCOUNTER — Ambulatory Visit
Admission: RE | Admit: 2023-05-23 | Discharge: 2023-05-23 | Disposition: A | Discharge status: Home / Self Care | Payer: Medicare Other | Source: Ambulatory Visit | Attending: Radiation Oncology | Admitting: Radiation Oncology

## 2023-05-23 ENCOUNTER — Ambulatory Visit: Payer: Medicare Other | Attending: Radiation Oncology | Admitting: Physical Therapy

## 2023-05-23 ENCOUNTER — Ambulatory Visit: Payer: Medicare Other | Attending: Radiation Oncology

## 2023-05-23 ENCOUNTER — Inpatient Hospital Stay: Payer: Medicare Other | Attending: Radiation Oncology

## 2023-05-23 ENCOUNTER — Other Ambulatory Visit: Payer: Self-pay

## 2023-05-23 ENCOUNTER — Encounter: Payer: Self-pay | Admitting: Cardiovascular Disease

## 2023-05-23 ENCOUNTER — Ambulatory Visit: Payer: Medicare Other | Attending: Cardiovascular Disease | Admitting: Cardiovascular Disease

## 2023-05-23 ENCOUNTER — Encounter: Payer: Self-pay | Admitting: Physical Therapy

## 2023-05-23 DIAGNOSIS — E7849 Other hyperlipidemia: Secondary | ICD-10-CM

## 2023-05-23 DIAGNOSIS — C7839 Secondary malignant neoplasm of other respiratory organs: Secondary | ICD-10-CM | POA: Diagnosis not present

## 2023-05-23 DIAGNOSIS — I2510 Atherosclerotic heart disease of native coronary artery without angina pectoris: Secondary | ICD-10-CM | POA: Diagnosis not present

## 2023-05-23 DIAGNOSIS — I10 Essential (primary) hypertension: Secondary | ICD-10-CM

## 2023-05-23 DIAGNOSIS — C329 Malignant neoplasm of larynx, unspecified: Secondary | ICD-10-CM | POA: Insufficient documentation

## 2023-05-23 DIAGNOSIS — R490 Dysphonia: Secondary | ICD-10-CM | POA: Insufficient documentation

## 2023-05-23 DIAGNOSIS — C320 Malignant neoplasm of glottis: Secondary | ICD-10-CM | POA: Diagnosis not present

## 2023-05-23 DIAGNOSIS — R1310 Dysphagia, unspecified: Secondary | ICD-10-CM | POA: Diagnosis not present

## 2023-05-23 DIAGNOSIS — R293 Abnormal posture: Secondary | ICD-10-CM | POA: Insufficient documentation

## 2023-05-23 DIAGNOSIS — M791 Myalgia, unspecified site: Secondary | ICD-10-CM | POA: Diagnosis not present

## 2023-05-23 DIAGNOSIS — Z510 Encounter for antineoplastic radiation therapy: Secondary | ICD-10-CM | POA: Diagnosis not present

## 2023-05-23 DIAGNOSIS — C321 Malignant neoplasm of supraglottis: Secondary | ICD-10-CM | POA: Diagnosis not present

## 2023-05-23 DIAGNOSIS — I739 Peripheral vascular disease, unspecified: Secondary | ICD-10-CM

## 2023-05-23 LAB — RAD ONC ARIA SESSION SUMMARY
Course Elapsed Days: 1
Plan Fractions Treated to Date: 2
Plan Prescribed Dose Per Fraction: 2 Gy
Plan Total Fractions Prescribed: 35
Plan Total Prescribed Dose: 70 Gy
Reference Point Dosage Given to Date: 4 Gy
Reference Point Session Dosage Given: 2 Gy
Session Number: 2

## 2023-05-23 MED ORDER — POTASSIUM CHLORIDE CRYS ER 20 MEQ PO TBCR
EXTENDED_RELEASE_TABLET | ORAL | 3 refills | Status: DC
Start: 2023-05-23 — End: 2023-11-14

## 2023-05-23 MED ORDER — FUROSEMIDE 40 MG PO TABS: ORAL_TABLET | ORAL | 3 refills | Status: DC

## 2023-05-23 NOTE — Progress Notes (Signed)
[facility] CSW Progress Note  Visual merchandiser met with patient to follow up on phone call at the [facility] and [employer]. CSW re-introduced herself to patient and patient's daughter.  CSW discussed support options available to patient, and provided a food bag from the [employer]. CSW planned to look at information that patient needed to submit to DSS, however patient forgot to bring paperwork to clinic appointment. CSW provided information regarding the [employer] and provided the card for Everlean Alstrom, CSW will also in basket S/ Yetta Flock to inform her of referral. Patient and Patient's daughter denied any questions at this time. Patient was given card with Csw direct information.  Marguerita Merles, LCSWA Clinical Social Worker [facility]

## 2023-05-23 NOTE — Progress Notes (Signed)
Received call from Radiation patient after receiving my card from Child psychotherapist.  Introduced myself as Dance movement psychotherapist and to offer available resources. Discussed one-time $1000 Marketing executive to assist with personal expenses while going through treatment. Advised what is needed to apply. She will call me when able to provide and to discuss next steps.  She has my card to do so and for any additional financial questions or concerns.

## 2023-05-23 NOTE — Therapy (Signed)
OUTPATIENT PHYSICAL THERAPY HEAD AND NECK BASELINE EVALUATION   Patient Name: Marie Johnson MRN: [id number] DOB:1951-02-16, 72 y.o., female Today's Date: 05/23/2023  END OF SESSION:  PT End of Session - 05/23/23 0850     Visit Number 1    Number of Visits 2    Date for PT Re-Evaluation 07/18/23    PT Start Time 0812    PT Stop Time 0846    PT Time Calculation (min) 34 min    Activity Tolerance Treatment limited secondary to medical complications (Comment)   acute CHF flare up   Behavior During Therapy [facility] for tasks assessed/performed             Past Medical History:  Diagnosis Date   AAA (abdominal aortic aneurysm) (HCC)    3.4 cm   Anemia    Anxiety    Aortic atherosclerosis (HCC)    Arthritis    Asthma    CAD (coronary artery disease)    a. Nonobstructive CAD by cath 07/17/13.   Cancer [facility])    Carotid artery disease (HCC)    Carotid US 8/22: Bilateral ICA 1-39; right subclavian stenosis; right thyroid nodule   CHD (congenital heart disease)    pt unaware???   Chest pain    a. Adm 10-07/2013 - CTA neg for PE/dissection, cath with nonobstructive disease, CP ?due to uncontrolled HTN vs coronary vasospasm.   CHF (congestive heart failure) (HCC)    Closed head injury with concussion    COPD (chronic obstructive pulmonary disease) (HCC)    Coronary artery disease    Echocardiogram    Echocardiogram 06/2019: EF 60-65, impaired relaxation (Gr 1 DD), mild MAC, mild MR, mild to mod aortic valve sclerosis (no AS), trivial TR, mild LAE, normal RVSF, RVSP 30.7 (mildly elevated)   GERD (gastroesophageal reflux disease)    History of blood transfusion    childbirth, Hysterectomy   Hyperlipidemia    Hypertension    Neuropathy    OA (osteoarthritis)    Osteoporosis    PAD (peripheral artery disease) (HCC)    a. s/p L fem-pop bypass b. recent evaluation at [facility], unamenable to intervention   PAD (peripheral artery disease) (HCC)    Palpitations 02/22/2016   Pneumonia     Pre-diabetes    Premature ventricular contraction    PUD (peptic ulcer disease)    PVD (peripheral vascular disease) (HCC) 07/17/2013   Thoracic aortic aneurysm (HCC)    Chest/aorta CTA 04/2022: 14 x 8 focal saccular aneurysm versus penetrating atherosclerotic ulcer involving left side of proximal portion of transverse aortic arch; descending thoracic aorta and proximal abdominal aorta aneurysm 4.4 cm; aortic atherosclerosis; emphysema   Tobacco abuse    Wears dentures    Wears glasses    Past Surgical History:  Procedure Laterality Date   ABDOMINAL AORTOGRAM W/LOWER EXTREMITY N/A 03/09/2020   Procedure: ABDOMINAL AORTOGRAM W/LOWER EXTREMITY;  Surgeon: Cephus Shelling, MD;  Location: [facility] INVASIVE CV LAB;  Service: Cardiovascular;  Laterality: N/A;   ABDOMINAL AORTOGRAM W/LOWER EXTREMITY N/A 07/26/2022   Procedure: ABDOMINAL AORTOGRAM W/LOWER EXTREMITY;  Surgeon: Cephus Shelling, MD;  Location: [facility] INVASIVE CV LAB;  Service: Cardiovascular;  Laterality: N/A;   ABDOMINAL AORTOGRAM W/LOWER EXTREMITY N/A 09/20/2022   Procedure: ABDOMINAL AORTOGRAM W/LOWER EXTREMITY;  Surgeon: Cephus Shelling, MD;  Location: [facility] INVASIVE CV LAB;  Service: Cardiovascular;  Laterality: N/A;   ABDOMINAL HYSTERECTOMY     BREAST SURGERY     left cyst excision x 2  CARDIAC CATHETERIZATION     several years ago, nonobstructive, "50-60% blockages"   DIRECT LARYNGOSCOPY Bilateral 03/13/2023   Procedure: MICRO DIRECT LARYNGOSCOPY WITH BIOPSY;  Surgeon: Christia Reading, MD;  Location: [facility] OR;  Service: ENT;  Laterality: Bilateral;   ENDARTERECTOMY FEMORAL Right 03/18/2020   Procedure: ENDARTERECTOMY FEMORAL WITH PROFUNDAPLASTY;  Surgeon: Cephus Shelling, MD;  Location[facility] OR;  Service: Vascular;  Laterality: Right;   FEMORAL-POPLITEAL BYPASS GRAFT Right 03/18/2020   Procedure: BYPASS GRAFT FEMORAL-POPLITEAL ARTERY using GORE PROPATEN VASCULAR GRAFT;  Surgeon: Cephus Shelling, MD;  Location: [facility] OR;  Service:  Vascular;  Laterality: Right;   HIP SURGERY     left - bars and screws placed   HIP SURGERY     INSERTION OF ILIAC STENT Right 03/18/2020   Procedure: INSERTION OF GORE VIABAHN  ILIAC STENT;  Surgeon: Cephus Shelling, MD;  Location: [facility] OR;  Service: Vascular;  Laterality: Right;   LEFT HEART CATH AND CORONARY ANGIOGRAPHY N/A 04/08/2023   Procedure: LEFT HEART CATH AND CORONARY ANGIOGRAPHY;  Surgeon: Marykay Lex, MD;  Location: [facility] INVASIVE CV LAB;  Service: Cardiovascular;  Laterality: N/A;   LEFT HEART CATHETERIZATION WITH CORONARY ANGIOGRAM N/A 07/17/2013   Procedure: LEFT HEART CATHETERIZATION WITH CORONARY ANGIOGRAM;  Surgeon: Laurey Morale, MD;  Location[facility] CATH LAB;  Service: Cardiovascular;  Laterality: N/A;   LOWER EXTREMITY ANGIOGRAM Right 03/18/2020   Procedure: RIGHT ILIAC ARTERIOGRAM WITH AN ILIAC STENT;  Surgeon: Cephus Shelling, MD;  Location: [facility] OR;  Service: Vascular;  Laterality: Right;   MULTIPLE TOOTH EXTRACTIONS     PATCH ANGIOPLASTY Right 03/18/2020   Procedure: PATCH ANGIOPLASTY using a Livia Snellen BIOLOGIC PATCH OF THE PROFUNDA;  Surgeon: Cephus Shelling, MD;  Location: [facility] OR;  Service: Vascular;  Laterality: Right;   PERIPHERAL VASCULAR CATHETERIZATION N/A 07/16/2016   Procedure: Abdominal Aortogram w/Lower Extremity;  Surgeon: Larina Earthly, MD;  Location: [facility] INVASIVE CV LAB;  Service: Cardiovascular;  Laterality: N/A;   PERIPHERAL VASCULAR INTERVENTION Left 03/09/2020   Procedure: PERIPHERAL VASCULAR INTERVENTION;  Surgeon: Cephus Shelling, MD;  Location: [facility] INVASIVE CV LAB;  Service: Cardiovascular;  Laterality: Left;  common and external illiac    ROTATOR CUFF REPAIR     right side, had torn ligaments as well   TUBAL LIGATION     VEIN BYPASS SURGERY     left leg   WRIST ARTHROCENTESIS     left - broke in 3 places so has bars and screws placed   Patient Active Problem List   Diagnosis Date Noted   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) 05/03/2023   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   Demand ischemia 04/06/2023   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   Critical limb ischemia of left lower extremity (HCC) 07/26/2022   Thoracic aortic aneurysm (HCC) 04/22/2022   PAD (peripheral artery disease) (HCC) 03/18/2020   Critical limb ischemia with history of revascularization of same extremity (HCC) 03/08/2020   Chronic diastolic CHF (congestive heart failure) (HCC) 07/14/2018   Acute sinusitis 12/05/2016   Premature ventricular contraction 03/26/2016   Chest pain 02/22/2016   Palpitations 02/22/2016   CAD (coronary artery disease) 07/17/2013   PVD (peripheral vascular disease) (HCC) 07/17/2013   Aortic atherosclerosis (HCC) 07/17/2013   AAA (abdominal aortic aneurysm) (HCC) 07/17/2013   Tobacco abuse 07/17/2013   Hypertension 07/17/2013   Hyperlipidemia 07/17/2013   COPD (chronic obstructive pulmonary disease) (HCC) 07/17/2013   GERD (gastroesophageal reflux disease) 07/17/2013  PCP: Burnell Blanks, MD  REFERRING PROVIDER: Lonie Peak, MD  REFERRING DIAG: C32.9 (ICD-10-CM) - Laryngeal cancer [facility])   THERAPY DIAG:  Abnormal posture  Carcinoma larynx (HCC)  Rationale for Evaluation and Treatment: Rehabilitation  ONSET DATE: 03/13/23  SUBJECTIVE:     SUBJECTIVE STATEMENT: Patient reports they are here today to be seen by their medical team for newly diagnosed cancer of larynx.    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 On 4/15 she presented for her routine visit and Laryngoscopy performed showed a superficial ulceration with raised irregular borders of the inferior portion of the epiglottis. 03/13/23 She underwent microlaryngoscopy with biopsies of the epiglottis and left true vocal fold by Dr.  Jenne Pane. 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 04/26/23 PET demonstrated asymmetric laryngeal activity (left greater than right) corresponding to the biopsy proven neoplasm. There were no signs of metastatic disease in the neck, chest, abdomen, or pelvis. She will receive 35 fractions of radiation to her Larynx and a lower dose to her bilateral neck. She started on 9/4 and will complete 10/22. Has hx of CHF, COPD, asthma, PAD, neuropathy and occasionally wears O2, broke L wrist and has metal in L wrist  PATIENT GOALS:   to be educated about the signs and symptoms of lymphedema and learn post op HEP.   PAIN:  Are you having pain? Yes: NPRS scale: 7/10 Pain location: bilateral LEs, worse in L Pain description: burning, throbbing Aggravating factors: unsure, walking Relieving factors: pain medication, moving around  PRECAUTIONS: Active CA, bilateral hip replacements, CHF, COPD  RED FLAGS: Abdominal aneurysm: Yes:      WEIGHT BEARING RESTRICTIONS: No  FALLS:  Has patient fallen in last 6 months? No Does the patient have a fear of falling that limits activity? No Is the patient reluctant to leave the house due to a fear of falling?No  LIVING ENVIRONMENT: Patient lives with: husband Lives in: Mobile home Has following equipment at home: Environmental consultant - 4 wheeled, shower chair, and Grab bars  OCCUPATION: retired  LEISURE: does not exercise  PRIOR LEVEL OF FUNCTION: Independent   OBJECTIVE:  COGNITION: Overall cognitive status: Within functional limits for tasks assessed                  POSTURE:  Forward head and rounded shoulders posture  30 SEC SIT TO STAND: Not done today due to CHF flare up, pt has appointment with cardiologist at 11 today  SHOULDER AROM:   WFL   CERVICAL AROM:   Percent limited  Flexion WFL  Extension WFL  Right lateral flexion WFL  Left lateral flexion WFL  Right rotation WFL  Left rotation WFL    (Blank  rows=not tested)  GAIT: Assessed: No not assessed due to acute CHF flare up - pt going to cardiologist at 11   PATIENT EDUCATION:  Education details: Neck ROM, importance of posture when sitting, standing and lying down, deep breathing, walking program and importance of staying active throughout treatment, CURE article on staying active, "Why exercise?" flyer, lymphedema and PT info Person educated: Patient Education method: Explanation, Demonstration, Handout Education comprehension: Patient verbalized understanding and returned demonstration  HOME EXERCISE PROGRAM: Patient was instructed today in a home exercise program today for head and neck range of  motion exercises. These included active cervical flexion, active cervical extension, active cervical rotation to each direction, upper trap stretch, and shoulder retraction. Patient was encouraged to do these 2-3 times a day, holding for 5 sec each and completing for 5 reps. Pt was educated that once this becomes easier then hold the stretches for 30-60 seconds.    ASSESSMENT:  CLINICAL IMPRESSION: Pt arrives to PT with recently diagnosed laryngeal cancer. She will receive 35 fractions of radiation to her Larynx and a lower dose to her bilateral neck. She started on 9/4 and will complete 10/22.  Pt's cervical ROM was [facility]. Educated pt about signs and symptoms of lymphedema as well as anatomy and physiology of lymphatic system. Educated pt in importance of staying as active as possible throughout treatment to decrease fatigue as well as head and neck ROM exercises to decrease loss of ROM. Will see pt after completion of radiation to reassess ROM and assess for lymphedema and to determine therapy needs at that time.  Pt will benefit from skilled therapeutic intervention to improve on the following deficits: Decreased knowledge of precautions and postural dysfunction.   PT treatment/interventions: ADL/self-care home management, pt/family  education, therapeutic exercise.   REHAB POTENTIAL: Good  CLINICAL DECISION MAKING: Stable/uncomplicated  EVALUATION COMPLEXITY: Low   GOALS: Goals reviewed with patient? YES  LONG TERM GOALS: (STG=LTG)   Name Target Date  Goal status  1 Patient will be able to verbalize understanding of a home exercise program for cervical range of motion, posture, and walking.   Baseline:  No knowledge 05/23/2023 Achieved at eval  2 Patient will be able to verbalize understanding of proper sitting and standing posture. Baseline:  No knowledge 05/23/2023 Achieved at eval  3 Patient will be able to verbalize understanding of lymphedema risk and availability of treatment for this condition Baseline:  No knowledge 05/23/2023 Achieved at eval  4 Pt will demonstrate a return to full cervical ROM and function post operatively compared to baselines and not demonstrate any signs or symptoms of lymphedema.  Baseline: See objective measurements taken today. 07/18/23 New    PLAN:  PT FREQUENCY/DURATION: EVAL and 1 follow up appointment.   PLAN FOR NEXT SESSION: will reassess 2 weeks after completion of radiation to determine needs.  Patient will follow up at outpatient cancer rehab 2 weeks after completion of radiation.  If the patient requires physical therapy at that time, a specific plan will be dictated and sent to the referring physician for approval. The patient was educated today on appropriate basic range of motion exercises to begin now and continue throughout radiation and educated on the signs and symptoms of lymphedema. Patient verbalized good understanding.     Physical Therapy Information for During and After Head/Neck Cancer Treatment: Lymphedema is a swelling condition that you may be at risk for in your neck and/or face if you have radiation treatment to the area and/or if you have surgery that includes removing lymph nodes.  There is treatment available for this condition and it is not  life-threatening.  Contact your physician or physical therapist with concerns. An excellent resource for those seeking information on lymphedema is the National Lymphedema Network's website.  It can be accessed at www.lymphnet.org If you notice swelling in your neck or face at any time following surgery (even if it is many years from now), please contact your doctor or physical therapist to discuss this.  Lymphedema can be treated at any time but it is easier for you if  it is treated early on. If you have had surgery to your neck, please check with your surgeon about how soon to start doing neck range of motion exercises.  If you are not having surgery, I encourage you to start doing neck range of motion exercises today and continue these while undergoing treatment, UNLESS you have irritation of your skin or soft tissue that is aggravated by doing them.  These exercises are intended to help you prevent loss of range of motion and/or to gain range of motion in your neck (which can be limited by tightening effects of radiation), and NOT to aggravate these tissues if they develop sensitivities from treatment. Neck range of motion exercises should be done to the point of feeling a GENTLE, TOLERABLE stretch only.  You are encouraged to start a walking or other exercise program tomorrow and continue this as much as you are able through and after treatment.  Please feel free to call me with any questions. Leonette Most, PT, CLT Physical Therapist and Certified Lymphedema Therapist [facility] [address], [address], [place], [place] [address] [phone] [email or web address]  WALKING  Walking is a great form of exercise to increase your strength, endurance and overall fitness.  A walking program can help you start slowly and gradually build endurance as you go.  Everyone's ability is different, so each person's starting point will be different.  You do not have to  follow them exactly.  The are just samples. You should simply find out what's right for you and stick to that program.   In the beginning, you'll start off walking 2-3 times a day for short distances.  As you get stronger, you'll be walking further at just 1-2 times per day.  A. You Can Walk For A Certain Length Of Time Each Day    Walk 5 minutes 3 times per day.  Increase 2 minutes every 2 days (3 times per day).  Work up to 25-30 minutes (1-2 times per day).   Example:   Day 1-2 5 minutes 3 times per day   Day 7-8 12 minutes 2-3 times per day   Day 13-14 25 minutes 1-2 times per day  B. You Can Walk For a Certain Distance Each Day     Distance can be substituted for time.    Example:   3 trips to mailbox (at road)   3 trips to corner of block   3 trips around the block  C. Go to local high school and use the track.    Walk for distance ____ around track  Or time ____ minutes  D. Walk ____ Jog ____ Run ___   Why exercise?  So many benefits! Here are SOME of them: Heart health, including raising your good cholesterol level and reducing heart rate and blood pressure Lung health, including improved lung capacity It burns fats, and most of [place] can stand to be leaner, whether or not we are overweight. It increases the body's natural painkillers and mood elevators, so makes you feel better. Not only makes you feel better, but look better too Improves sleep Takes a bite out of stress May decrease your risk of many types of cancer If you are currently undergoing cancer treatment, exercise may improve your ability to tolerate treatments including chemotherapy. For everybody, it can improve your energy level. Those with cancer-related fatigue report a 40-50% reduction in this symptom when exercising regularly. If you are a survivor of breast, colon, or prostate  cancer, it may decrease your risk of a recurrence. (This may hold for other cancers too, but so far we have data just for  these three types.)  How to exercise: Get your doctor's okay. Pick something you enjoy doing, like walking, Zumba, biking, swimming, or whatever. Start at low intensity and time, then gradually increase.  (See walking program handout.) Set a goal to achieve over time.  The American Cancer Society, American Heart Association, and U.S. Dept. of Health and Human Services recommend 150 minutes of moderate exercise, 75 minutes of vigorous exercise, or a combination of both per week. This should be done in episodes at least 10 minutes long, spread throughout the week.  Need help being motivated? Pick something you enjoy doing, because you'll be more inclined to stick with that activity than something that feels like a chore. Do it with a friend so that you are accountable to each other. Schedule it into your day. Place it on your calendar and keep that appointment just like you do any appointment that you make. Join an exercise group that meets at a specific time.  That way, you have to show up on time, and that makes it harder to procrastinate about doing your workout.  It also keeps you accountable--people begin to expect you to be there. Join a gym where you feel comfortable and not intimidated, at the right cost. Sign up for something that you'll need to be in shape for on a specific date, like a 1K or a 5K to walk or run, a 20 or 30 mile bike ride, a mud run or something like that. If the date is looming, you know you'll need to train to be ready for it.  An added benefit is that many of these are fundraisers for good causes. If you've already paid for a gym membership, group exercise class or event, you might as well work out, so you haven't wasted your money!    [facility] [place], PT 05/23/2023, 8:51 AM

## 2023-05-23 NOTE — Therapy (Signed)
OUTPATIENT SPEECH LANGUAGE PATHOLOGY ONCOLOGY EVALUATION   Patient Name: Marie Johnson MRN: [id number] DOB:1951/06/26, 72 y.o., female Today's Date: 05/23/2023  PCP: Ailene Ravel, MD REFERRING PROVIDER: Lonie Peak, MD  END OF SESSION:  End of Session - 05/23/23 2127     Visit Number 1    Number of Visits 7    Date for SLP Re-Evaluation 08/21/23    SLP Start Time 0900    SLP Stop Time  0935    SLP Time Calculation (min) 35 min    Activity Tolerance Patient tolerated treatment well             Past Medical History:  Diagnosis Date   AAA (abdominal aortic aneurysm) (HCC)    3.4 cm   Anemia    Anxiety    Aortic atherosclerosis (HCC)    Arthritis    Asthma    CAD (coronary artery disease)    a. Nonobstructive CAD by cath 07/17/13.   Cancer [facility])    Carotid artery disease (HCC)    Carotid US 8/22: Bilateral ICA 1-39; right subclavian stenosis; right thyroid nodule   CHD (congenital heart disease)    pt unaware???   Chest pain    a. Adm 10-07/2013 - CTA neg for PE/dissection, cath with nonobstructive disease, CP ?due to uncontrolled HTN vs coronary vasospasm.   CHF (congestive heart failure) (HCC)    Closed head injury with concussion    COPD (chronic obstructive pulmonary disease) (HCC)    Coronary artery disease    Echocardiogram    Echocardiogram 06/2019: EF 60-65, impaired relaxation (Gr 1 DD), mild MAC, mild MR, mild to mod aortic valve sclerosis (no AS), trivial TR, mild LAE, normal RVSF, RVSP 30.7 (mildly elevated)   GERD (gastroesophageal reflux disease)    History of blood transfusion    childbirth, Hysterectomy   Hyperlipidemia    Hypertension    Neuropathy    OA (osteoarthritis)    Osteoporosis    PAD (peripheral artery disease) (HCC)    a. s/p L fem-pop bypass b. recent evaluation at [facility], unamenable to intervention   PAD (peripheral artery disease) (HCC)    Palpitations 02/22/2016   Pneumonia    Pre-diabetes    Premature ventricular  contraction    PUD (peptic ulcer disease)    PVD (peripheral vascular disease) (HCC) 07/17/2013   Thoracic aortic aneurysm (HCC)    Chest/aorta CTA 04/2022: 14 x 8 focal saccular aneurysm versus penetrating atherosclerotic ulcer involving left side of proximal portion of transverse aortic arch; descending thoracic aorta and proximal abdominal aorta aneurysm 4.4 cm; aortic atherosclerosis; emphysema   Tobacco abuse    Wears dentures    Wears glasses    Past Surgical History:  Procedure Laterality Date   ABDOMINAL AORTOGRAM W/LOWER EXTREMITY N/A 03/09/2020   Procedure: ABDOMINAL AORTOGRAM W/LOWER EXTREMITY;  Surgeon: Cephus Shelling, MD;  Location: [facility] INVASIVE CV LAB;  Service: Cardiovascular;  Laterality: N/A;   ABDOMINAL AORTOGRAM W/LOWER EXTREMITY N/A 07/26/2022   Procedure: ABDOMINAL AORTOGRAM W/LOWER EXTREMITY;  Surgeon: Cephus Shelling, MD;  Location: [facility] INVASIVE CV LAB;  Service: Cardiovascular;  Laterality: N/A;   ABDOMINAL AORTOGRAM W/LOWER EXTREMITY N/A 09/20/2022   Procedure: ABDOMINAL AORTOGRAM W/LOWER EXTREMITY;  Surgeon: Cephus Shelling, MD;  Location: [facility] INVASIVE CV LAB;  Service: Cardiovascular;  Laterality: N/A;   ABDOMINAL HYSTERECTOMY     BREAST SURGERY     left cyst excision x 2   CARDIAC CATHETERIZATION     several years  ago, nonobstructive, "50-60% blockages"   DIRECT LARYNGOSCOPY Bilateral 03/13/2023   Procedure: MICRO DIRECT LARYNGOSCOPY WITH BIOPSY;  Surgeon: Christia Reading, MD;  Location: [facility] OR;  Service: ENT;  Laterality: Bilateral;   ENDARTERECTOMY FEMORAL Right 03/18/2020   Procedure: ENDARTERECTOMY FEMORAL WITH PROFUNDAPLASTY;  Surgeon: Cephus Shelling, MD;  Location: [facility] OR;  Service: Vascular;  Laterality: Right;   FEMORAL-POPLITEAL BYPASS GRAFT Right 03/18/2020   Procedure: BYPASS GRAFT FEMORAL-POPLITEAL ARTERY using GORE PROPATEN VASCULAR GRAFT;  Surgeon: Cephus Shelling, MD;  Location: [facility] OR;  Service: Vascular;  Laterality: Right;   HIP  SURGERY     left - bars and screws placed   HIP SURGERY     INSERTION OF ILIAC STENT Right 03/18/2020   Procedure: INSERTION OF GORE VIABAHN  ILIAC STENT;  Surgeon: Cephus Shelling, MD;  Location: [facility] OR;  Service: Vascular;  Laterality: Right;   LEFT HEART CATH AND CORONARY ANGIOGRAPHY N/A 04/08/2023   Procedure: LEFT HEART CATH AND CORONARY ANGIOGRAPHY;  Surgeon: Marykay Lex, MD;  Location: [facility] INVASIVE CV LAB;  Service: Cardiovascular;  Laterality: N/A;   LEFT HEART CATHETERIZATION WITH CORONARY ANGIOGRAM N/A 07/17/2013   Procedure: LEFT HEART CATHETERIZATION WITH CORONARY ANGIOGRAM;  Surgeon: Laurey Morale, MD;  Location: [facility] CATH LAB;  Service: Cardiovascular;  Laterality: N/A;   LOWER EXTREMITY ANGIOGRAM Right 03/18/2020   Procedure: RIGHT ILIAC ARTERIOGRAM WITH AN ILIAC STENT;  Surgeon: Cephus Shelling, MD;  Location: [facility] OR;  Service: Vascular;  Laterality: Right;   MULTIPLE TOOTH EXTRACTIONS     PATCH ANGIOPLASTY Right 03/18/2020   Procedure: PATCH ANGIOPLASTY using a Livia Snellen BIOLOGIC PATCH OF THE PROFUNDA;  Surgeon: Cephus Shelling, MD;  Location: [facility] OR;  Service: Vascular;  Laterality: Right;   PERIPHERAL VASCULAR CATHETERIZATION N/A 07/16/2016   Procedure: Abdominal Aortogram w/Lower Extremity;  Surgeon: Larina Earthly, MD;  Location: [facility] INVASIVE CV LAB;  Service: Cardiovascular;  Laterality: N/A;   PERIPHERAL VASCULAR INTERVENTION Left 03/09/2020   Procedure: PERIPHERAL VASCULAR INTERVENTION;  Surgeon: Cephus Shelling, MD;  Location: [facility] INVASIVE CV LAB;  Service: Cardiovascular;  Laterality: Left;  common and external illiac    ROTATOR CUFF REPAIR     right side, had torn ligaments as well   TUBAL LIGATION     VEIN BYPASS SURGERY     left leg   WRIST ARTHROCENTESIS     left - broke in 3 places so has bars and screws placed   Patient Active Problem List   Diagnosis Date Noted   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   Demand ischemia 04/06/2023   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   Critical limb ischemia of left lower extremity (HCC) 07/26/2022   Thoracic aortic aneurysm (HCC) 04/22/2022   PAD (peripheral artery disease) (HCC) 03/18/2020   Critical limb ischemia with history of revascularization of same extremity (HCC) 03/08/2020   Chronic diastolic CHF (congestive heart failure) (HCC) 07/14/2018   Acute sinusitis 12/05/2016   Premature ventricular contraction 03/26/2016   Chest pain 02/22/2016   Palpitations 02/22/2016   Coronary artery disease involving native coronary artery of native heart 07/17/2013   PVD (peripheral vascular disease) (HCC) 07/17/2013   Aortic atherosclerosis (HCC) 07/17/2013   AAA (abdominal aortic aneurysm) (HCC) 07/17/2013   Tobacco abuse 07/17/2013   Hypertension 07/17/2013   Hyperlipidemia 07/17/2013   COPD (chronic obstructive pulmonary disease) (HCC) 07/17/2013   GERD (gastroesophageal reflux disease) 07/17/2013    ONSET  DATE: Spring 2024   REFERRING DIAG: Laryngeal cancer  THERAPY DIAG:  Dysphagia, unspecified type  Hoarseness  Rationale for Evaluation and Treatment: Rehabilitation  SUBJECTIVE:   SUBJECTIVE STATEMENT: Denies overt s/sx oral and pharyngeal dysphagia. Pt accompanied by: family member  PERTINENT HISTORY:  She has been following with ENT for routine 6 month visits due to chronic dysphonia since 2023 that had remained stable. On 4/15 she presented for her routine visit and Laryngoscopy performed showed a superficial ulceration with raised irregular borders of the inferior portion of the epiglottis. 03/13/23 She underwent microlaryngoscopy with biopsies of the epiglottis and left true vocal fold by Dr. Jenne Pane. 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 04/26/23 PET demonstrated asymmetric laryngeal activity (left greater than right) corresponding to the biopsy proven neoplasm. There  were no signs of metastatic disease in the neck, chest, abdomen, or pelvis. 05/03/23 Consult with Dr. Basilio Cairo. She recommended 35 fractions of radiation. Treatment plan:  She will receive 35 fractions of radiation to her Larynx and a lower dose to her bilateral neck. She started on 9/4 and will complete 07/09/23.  PAIN:  Are you having pain? No  FALLS: Has patient fallen in last 6 months?  See PT evaluation for details  LIVING ENVIRONMENT: Lives with: lives with their spouse Lives in: Mobile home  PLOF:  Level of assistance: Independent with ADLs, Independent with IADLs Employment: Retired  PATIENT GOALS: Prolong WNL swallowing as long as possible  OBJECTIVE:   COGNITION: Overall cognitive status: Within functional limits for tasks assessed  LANGUAGE: Receptive and Expressive language appeared WNL.  ORAL MOTOR EXAMINATION: Overall status: WFL   MOTOR SPEECH: Overall motor speech: Appears intact Respiration: thoracic breathing Phonation: hoarse Resonance: WFL Articulation: Appears intact Intelligibility: Intelligible   CLINICAL SWALLOW ASSESSMENT:   Current diet: regular and thin liquids Objective swallow impairments: none  Objective recommended compensations: none Dentition: dentures (top)needs to have gum sx before lower dentures placed Patient directly observed with POs: Yes: dysphagia 3 (soft) and thin liquids  Feeding: able to feed self Liquids provided by: cup Oral phase signs and symptoms:  none noted today Pharyngeal phase signs and symptoms:  none noted today    TODAY'S TREATMENT:                                                                                                                                         DATE:   05/23/23 (eval): Research states the risk for dysphagia increases due to radiation and/or chemotherapy treatment due to a variety of factors, so SLP educated the pt about the possibility of reduced/limited ability for PO intake during rad  tx. SLP also educated pt regarding possible changes to swallowing musculature after rad tx, and why adherence to dysphagia HEP provided today and PO consumption was necessary to inhibit muscle fibrosis following rad tx and to mitigate muscle disuse atrophy. SLP informed  pt why this would be detrimental to their swallowing status and to their pulmonary health. Pt demonstrated understanding of these things to SLP. SLP encouraged pt to safely eat and drink as deep into their radiation/chemotherapy as possible to provide the best possible long-term swallowing outcome for pt.  SLP then developed an individualized HEP for pt involving oral and pharyngeal strengthening and ROM and pt was instructed how to perform these exercises, including SLP demonstration. After SLP demonstration, pt return demonstrated each exercise. SLP ensured pt performance was correct prior to educating pt on next exercise. Pt required min cues faded to modified independent to perform HEP. Pt was instructed to complete this program 6-7 days/week, at least 20 reps/day in sets of no less than 5, until 6 months after her last day of rad tx, and then x2 a week after that, indefinitely. Among other modifications for HEP for days when pt cannot functionally swallow,  pt was told that former patients have told SLP that during their course of radiation therapy, taking prescribed pain medication just prior to performing HEP (and eating/drinking) has proven helpful in completing HEP (and eating and drinking) more regularly when going through their course of radiation treatment.    PATIENT EDUCATION: Education details: late effects head/neck radiation on swallow function, HEP procedure, and modification to HEP when difficulty experienced with swallowing during and after radiation course Person educated: Patient and Child(ren) Education method: Explanation, Demonstration, Verbal cues, and Handouts Education comprehension: verbalized understanding,  returned demonstration, verbal cues required, and needs further education   ASSESSMENT:  CLINICAL IMPRESSION: Patient is a 72 y.o. F who was seen today for assessment of swallowing as they undergo radiation/chemoradiation therapy. Today pt ate [place] sandwich and drank thin liquids without overt s/s oral or pharyngeal difficulty. At this time pt swallowing is deemed WNL/WFL with these POs. No oral or overt s/sx pharyngeal deficits, including aspiration were observed. There are no overt s/s aspiration PNA observed by SLP nor any reported by pt at this time. Data indicate that pt's swallow ability will likely decrease over the course of radiation/chemoradiation therapy and could very well decline over time following the conclusion of that therapy due to muscle disuse atrophy and/or muscle fibrosis. Pt will cont to need to be seen by SLP in order to assess safety of PO intake, assess the need for recommending any objective swallow assessment, and ensuring pt is correctly completing the individualized HEP.  OBJECTIVE IMPAIRMENTS: include voice disorder and dysphagia. These impairments are limiting patient from household responsibilities, ADLs/IADLs, effectively communicating at home and in community, and safety when swallowing. Factors affecting potential to achieve goals and functional outcome are  none noted today . Patient will benefit from skilled SLP services to address above impairments and improve overall function.  REHAB POTENTIAL: Good   GOALS: Goals reviewed with patient? No SHORT TERM GOALS: Target: 3rd total session   1. Pt will complete HEP with modified independence in 2 sessions Baseline: Goal status: INITIAL   2.  pt will tell SLP why pt is completing HEP with modified independence Baseline:  Goal status: INITIAL   3.  pt will describe 3 overt s/s aspiration PNA with modified independence Baseline:  Goal status: INITIAL   4.  pt will tell SLP how a food journal could hasten  return to a more normalized diet Baseline:  Goal status: INITIAL     LONG TERM GOALS: Target: 7th total session   1.  pt will complete HEP with independence over two  visits Baseline:  Goal status: INITIAL   2.  pt will describe how to modify HEP over time, and the timeline associated with reduction in HEP frequency with modified independence over two sessions Baseline:  Goal status: INITIAL     PLAN:   SLP FREQUENCY:  once approx every 4 weeks   SLP DURATION:  7 sessions   PLANNED INTERVENTIONS: Aspiration precaution training, Pharyngeal strengthening exercises, Diet toleration management , Trials of upgraded texture/liquids, SLP instruction and feedback, Compensatory strategies, and Patient/family education    [facility], CCC-SLP 05/23/2023, 9:35 PM

## 2023-05-23 NOTE — Patient Instructions (Addendum)
  For your  leg edema you  should do  the following 1. Leg elevation - I recommend the Lounge Dr. Leg rest.  See below for details  2. Salt restriction  -  Use potassium chloride instead of regular salt as a salt substitute. 3. Walk regularly 4. Compression hose - Medical Supply store  5. Weight loss    Available on [email or web address] Or  Go to [email or web address]    Medication Instructions:  Your physician has recommended you make the following change in your medication:  1- increase Lasix 40 mg by mouth twice daily on Monday, Wednesdays, and Fridays, then once daily on other days. 2-increase K-dur 20 meq by mouth twice daily on Monday, Wednesdays, and  Fridays, then once daily on other days  *If you need a refill on your cardiac medications before your next appointment, please call your pharmacy*  Lab Work: Your physician recommends that you return for lab work in: 2 weeks for BMET  If you have labs (blood work) drawn today and your tests are completely normal, you will receive your results only by: [employer] (if you have MyChart) OR A paper copy in the mail If you have any lab test that is abnormal or we need to change your treatment, we will call you to review the results.  Follow-Up: At [facility], you and your health needs are our priority.  As part of our continuing mission to provide you with exceptional heart care, we have created designated Provider Care Teams.  These Care Teams include your primary Cardiologist (physician) and Advanced Practice Providers (APPs -  Physician Assistants and Nurse Practitioners) who all work together to provide you with the care you need, when you need it.  We recommend signing up for the patient portal called "MyChart".  Sign up information is provided on this After Visit Summary.  MyChart is used to connect with patients for Virtual Visits (Telemedicine).  Patients are able to view lab/test results, encounter notes, upcoming  appointments, etc.  Non-urgent messages can be sent to your provider as well.   To learn more about what you can do with MyChart, go to [email or web address].    Your next appointment:   6 week(s)  Provider:   Jari Favre, PA-C, Ronie Spies, PA-C, Robin Searing, NP, Nada Boozer, NP, Jacolyn Reedy, PA-C, Eligha Bridegroom, NP, Tereso Newcomer, PA-C, or Perlie Gold, PA-C

## 2023-05-23 NOTE — Patient Instructions (Signed)
  SWALLOWING EXERCISES Do these until 6 months after your last day of radiation, then 2-3 times per week afterwards You can use a wet spoon to swallow something, if your mouth gets dry  Effortful Swallows - Press your tongue against the roof of your mouth for 3 seconds, then swallow your saliva as hard as you can  - Do at least 20 reps/day, in sets of 5-10  Masako Swallow - swallow with your tongue sticking out - Stick tongue out past your lips and gently bite tongue with your teeth - Swallow, while holding your tongue with your teeth - Do at least 20 reps/day, in sets of 5-10 Mendelsohn Maneuver - "squeeze swallow" exercise - Swallow, and squeeze tight to keep your Adam's Apple up - Hold the squeeze for 5-7 seconds - then relax - Do at least 20 reps/day, in sets of 5-10

## 2023-05-23 NOTE — Progress Notes (Signed)
Oncology Nurse Navigator Documentation   I met with Marie Johnson and her daughter before and after head and neck clinic today. She reports some increased swelling to her bilateral legs and increased supplemental oxygen use. She is scheduled to see her Cardiologist today for evaluation. She tells me that she is tolerating radiation well so far and denies any needs from me at this time. She knows to call me if she has any questions or concerns as she progresses through treatment.  Hedda Slade RN, BSN, OCN Head & Neck Oncology Nurse Navigator [facility] Phone # [phone]  Fax # [phone]

## 2023-05-24 ENCOUNTER — Ambulatory Visit
Admission: RE | Admit: 2023-05-24 | Discharge: 2023-05-24 | Disposition: A | Discharge status: Home / Self Care | Payer: Medicare Other | Source: Ambulatory Visit | Attending: Radiation Oncology

## 2023-05-24 ENCOUNTER — Inpatient Hospital Stay: Payer: Medicare Other | Admitting: Nutrition

## 2023-05-24 ENCOUNTER — Other Ambulatory Visit: Payer: Self-pay

## 2023-05-24 DIAGNOSIS — Z510 Encounter for antineoplastic radiation therapy: Secondary | ICD-10-CM | POA: Diagnosis not present

## 2023-05-24 DIAGNOSIS — C320 Malignant neoplasm of glottis: Secondary | ICD-10-CM | POA: Diagnosis not present

## 2023-05-24 DIAGNOSIS — C321 Malignant neoplasm of supraglottis: Secondary | ICD-10-CM | POA: Diagnosis not present

## 2023-05-24 LAB — RAD ONC ARIA SESSION SUMMARY
Course Elapsed Days: 2
Plan Fractions Treated to Date: 3
Plan Prescribed Dose Per Fraction: 2 Gy
Plan Total Fractions Prescribed: 35
Plan Total Prescribed Dose: 70 Gy
Reference Point Dosage Given to Date: 6 Gy
Reference Point Session Dosage Given: 2 Gy
Session Number: 3

## 2023-05-24 NOTE — Progress Notes (Signed)
72 year old female diagnosed with laryngeal cancer.  She will receive 35 fractions of radiation therapy and be followed by Dr. Basilio Cairo.  Past medical history includes nonobstructive CAD, CHF, PVD, COPD, hypertension, hyperlipidemia, GERD, prediabetes.  Medications include Lasix, supplemental oxygen, antacid, vitamin D, vitamin B12, lactulose, Protonix, potassium, and Senokot-S.  Labs were reviewed.  Height: 5 feet 7 and half inches. Weight: 133 pounds 12.8 ounces on September 5. Usual body weight: 120-125 pounds per patient. BMI not accurate secondary to fluid retention.  Patient reports that she wears upper dentures but tolerates a regular soft diet.  She does not eat a lot.  She usually has a couple of oatmeal cookies for breakfast and then works on a hamburger with peas throughout the day.  She only drinks water and coffee.  She does not enjoy milk.  She has been advised to follow a low-sodium diet secondary to fluid retention and history of CHF.  Patient reports she does not use a lot of processed foods.  She lightly salts her food at home on occasion.  She has notable 1-2+ pitting edema in her lower extremities.  Nutrition diagnosis: Food and nutrition related knowledge deficit related to laryngeal cancer and associated treatments as evidenced by no prior need for nutrition related information.  Intervention: Educated on importance of small frequent meals and snacks with high-calorie, high-protein foods. Briefly reviewed low-sodium diet restrictions.  Provided handout. Recommended patient drink 1-2 cartons of oral nutrition supplements daily.  Provided samples and coupons. Questions answered.  Monitoring, evaluation, goals: Patient will tolerate adequate calories and protein to minimize loss of lean body mass.  Next visit: Tuesday, September 10 with Rosalita Chessman.  **Disclaimer: This note was dictated with voice recognition software. Similar sounding words can inadvertently be transcribed  and this note may contain transcription errors which may not have been corrected upon publication of note.**

## 2023-05-25 DIAGNOSIS — Z79899 Other long term (current) drug therapy: Secondary | ICD-10-CM | POA: Diagnosis not present

## 2023-05-25 DIAGNOSIS — M25551 Pain in right hip: Secondary | ICD-10-CM | POA: Diagnosis not present

## 2023-05-25 DIAGNOSIS — M25552 Pain in left hip: Secondary | ICD-10-CM | POA: Diagnosis not present

## 2023-05-25 DIAGNOSIS — I739 Peripheral vascular disease, unspecified: Secondary | ICD-10-CM | POA: Diagnosis not present

## 2023-05-25 DIAGNOSIS — G8929 Other chronic pain: Secondary | ICD-10-CM | POA: Diagnosis not present

## 2023-05-27 ENCOUNTER — Other Ambulatory Visit: Payer: Self-pay

## 2023-05-27 ENCOUNTER — Ambulatory Visit
Admission: RE | Admit: 2023-05-27 | Discharge: 2023-05-27 | Disposition: A | Discharge status: Home / Self Care | Payer: Medicare Other | Source: Ambulatory Visit | Attending: Radiation Oncology

## 2023-05-27 ENCOUNTER — Other Ambulatory Visit: Payer: Self-pay | Admitting: Radiation Oncology

## 2023-05-27 ENCOUNTER — Ambulatory Visit
Admission: RE | Admit: 2023-05-27 | Discharge: 2023-05-27 | Disposition: A | Discharge status: Home / Self Care | Payer: Medicare Other | Source: Ambulatory Visit | Attending: Radiation Oncology | Admitting: Radiation Oncology

## 2023-05-27 DIAGNOSIS — C320 Malignant neoplasm of glottis: Secondary | ICD-10-CM

## 2023-05-27 DIAGNOSIS — Z510 Encounter for antineoplastic radiation therapy: Secondary | ICD-10-CM | POA: Diagnosis not present

## 2023-05-27 DIAGNOSIS — C321 Malignant neoplasm of supraglottis: Secondary | ICD-10-CM | POA: Diagnosis not present

## 2023-05-27 LAB — RAD ONC ARIA SESSION SUMMARY
Course Elapsed Days: 5
Plan Fractions Treated to Date: 4
Plan Prescribed Dose Per Fraction: 2 Gy
Plan Total Fractions Prescribed: 35
Plan Total Prescribed Dose: 70 Gy
Reference Point Dosage Given to Date: 8 Gy
Reference Point Session Dosage Given: 2 Gy
Session Number: 4

## 2023-05-27 MED ORDER — SONAFINE EX EMUL
1.0000 | Freq: Once | CUTANEOUS | Status: AC
  Administered 2023-05-27: 1 via TOPICAL

## 2023-05-28 ENCOUNTER — Other Ambulatory Visit: Payer: Self-pay

## 2023-05-28 ENCOUNTER — Inpatient Hospital Stay: Payer: Medicare Other | Admitting: Dietician

## 2023-05-28 ENCOUNTER — Other Ambulatory Visit: Payer: Self-pay | Admitting: Radiation Oncology

## 2023-05-28 ENCOUNTER — Ambulatory Visit
Admission: RE | Admit: 2023-05-28 | Discharge: 2023-05-28 | Disposition: A | Discharge status: Home / Self Care | Payer: Medicare Other | Source: Ambulatory Visit | Attending: Radiation Oncology | Admitting: Radiation Oncology

## 2023-05-28 DIAGNOSIS — Z510 Encounter for antineoplastic radiation therapy: Secondary | ICD-10-CM | POA: Diagnosis not present

## 2023-05-28 DIAGNOSIS — C320 Malignant neoplasm of glottis: Secondary | ICD-10-CM

## 2023-05-28 DIAGNOSIS — Z79899 Other long term (current) drug therapy: Secondary | ICD-10-CM | POA: Diagnosis not present

## 2023-05-28 DIAGNOSIS — C321 Malignant neoplasm of supraglottis: Secondary | ICD-10-CM | POA: Diagnosis not present

## 2023-05-28 LAB — RAD ONC ARIA SESSION SUMMARY
Course Elapsed Days: 6
Plan Fractions Treated to Date: 5
Plan Prescribed Dose Per Fraction: 2 Gy
Plan Total Fractions Prescribed: 35
Plan Total Prescribed Dose: 70 Gy
Reference Point Dosage Given to Date: 10 Gy
Reference Point Session Dosage Given: 2 Gy
Session Number: 5

## 2023-05-28 MED ORDER — LIDOCAINE VISCOUS HCL 2 % MT SOLN
OROMUCOSAL | 3 refills | Status: DC
Start: 2023-05-28 — End: 2023-08-21

## 2023-05-28 NOTE — Progress Notes (Signed)
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 Patient has completed 5 of 35 planned fractions.   Met with pt and daughter in office. Pt reports appetite is pretty good. Daughter reports she eats one meal a day (supper). She will have a couple oatmeal cookies in the morning. Recalls brunswick stew for dinner. Pt tried Ensure samples. These were pretty good. Patient reports mild throat irritation today. She denies nutrition impact symptoms at this time.   Medications: lasix 40mg  (9/5)  Labs: reviewed   Anthropometrics: Wt 137.4 lb on 9/9 increased (fluid - swelling of BLE noted)  9/5 - 133 lb 12.8 oz    NUTRITION DIAGNOSIS: Food and nutrition related knowledge deficit continues    INTERVENTION:  Reinforced importance of adequate calorie and protein energy intake to maintain strength/weights during treatment Encouraged high calorie high protein snacks and soft moist foods for ease of intake - handouts with ideas provided Recommend 2 Ensure Plus/equivalent split over 4 servings/day d/t poor baseline appetite - additional samples + coupons provided Encouraged baking soda salt water rinses several times daily    MONITORING, EVALUATION, GOAL: wt trends, intake   NEXT VISIT: Wednesday September 18 after radiation

## 2023-05-29 ENCOUNTER — Ambulatory Visit (HOSPITAL_COMMUNITY)
Admission: RE | Admit: 2023-05-29 | Payer: Medicare Other | Source: Ambulatory Visit | Attending: Cardiology | Admitting: Cardiology

## 2023-05-29 ENCOUNTER — Other Ambulatory Visit: Payer: Self-pay

## 2023-05-29 ENCOUNTER — Ambulatory Visit
Admission: RE | Admit: 2023-05-29 | Discharge: 2023-05-29 | Disposition: A | Discharge status: Home / Self Care | Payer: Medicare Other | Source: Ambulatory Visit | Attending: Radiation Oncology

## 2023-05-29 DIAGNOSIS — Z510 Encounter for antineoplastic radiation therapy: Secondary | ICD-10-CM | POA: Diagnosis not present

## 2023-05-29 DIAGNOSIS — C320 Malignant neoplasm of glottis: Secondary | ICD-10-CM | POA: Diagnosis not present

## 2023-05-29 DIAGNOSIS — C321 Malignant neoplasm of supraglottis: Secondary | ICD-10-CM | POA: Diagnosis not present

## 2023-05-29 LAB — RAD ONC ARIA SESSION SUMMARY
Course Elapsed Days: 7
Plan Fractions Treated to Date: 6
Plan Prescribed Dose Per Fraction: 2 Gy
Plan Total Fractions Prescribed: 35
Plan Total Prescribed Dose: 70 Gy
Reference Point Dosage Given to Date: 12 Gy
Reference Point Session Dosage Given: 2 Gy
Session Number: 6

## 2023-05-30 ENCOUNTER — Other Ambulatory Visit: Payer: Medicare Other

## 2023-05-30 ENCOUNTER — Ambulatory Visit: Payer: Medicare Other

## 2023-05-30 ENCOUNTER — Other Ambulatory Visit: Payer: Self-pay

## 2023-05-30 ENCOUNTER — Ambulatory Visit
Admission: RE | Admit: 2023-05-30 | Discharge: 2023-05-30 | Disposition: A | Discharge status: Home / Self Care | Payer: Medicare Other | Source: Ambulatory Visit | Attending: Radiation Oncology | Admitting: Radiation Oncology

## 2023-05-30 DIAGNOSIS — Z510 Encounter for antineoplastic radiation therapy: Secondary | ICD-10-CM | POA: Diagnosis not present

## 2023-05-30 DIAGNOSIS — C320 Malignant neoplasm of glottis: Secondary | ICD-10-CM | POA: Diagnosis not present

## 2023-05-30 DIAGNOSIS — C321 Malignant neoplasm of supraglottis: Secondary | ICD-10-CM | POA: Diagnosis not present

## 2023-05-30 LAB — RAD ONC ARIA SESSION SUMMARY
Course Elapsed Days: 8
Plan Fractions Treated to Date: 7
Plan Prescribed Dose Per Fraction: 2 Gy
Plan Total Fractions Prescribed: 35
Plan Total Prescribed Dose: 70 Gy
Reference Point Dosage Given to Date: 14 Gy
Reference Point Session Dosage Given: 2 Gy
Session Number: 7

## 2023-05-31 ENCOUNTER — Ambulatory Visit
Admission: RE | Admit: 2023-05-31 | Discharge: 2023-05-31 | Disposition: A | Discharge status: Home / Self Care | Payer: Medicare Other | Source: Ambulatory Visit | Attending: Radiation Oncology | Admitting: Radiation Oncology

## 2023-05-31 ENCOUNTER — Other Ambulatory Visit: Payer: Self-pay

## 2023-05-31 DIAGNOSIS — C320 Malignant neoplasm of glottis: Secondary | ICD-10-CM | POA: Diagnosis not present

## 2023-05-31 DIAGNOSIS — C321 Malignant neoplasm of supraglottis: Secondary | ICD-10-CM | POA: Diagnosis not present

## 2023-05-31 DIAGNOSIS — Z510 Encounter for antineoplastic radiation therapy: Secondary | ICD-10-CM | POA: Diagnosis not present

## 2023-05-31 LAB — RAD ONC ARIA SESSION SUMMARY
Course Elapsed Days: 9
Plan Fractions Treated to Date: 8
Plan Prescribed Dose Per Fraction: 2 Gy
Plan Total Fractions Prescribed: 35
Plan Total Prescribed Dose: 70 Gy
Reference Point Dosage Given to Date: 16 Gy
Reference Point Session Dosage Given: 2 Gy
Session Number: 8

## 2023-06-03 ENCOUNTER — Ambulatory Visit
Admission: RE | Admit: 2023-06-03 | Discharge: 2023-06-03 | Disposition: A | Discharge status: Home / Self Care | Payer: Medicare Other | Source: Ambulatory Visit | Attending: Radiation Oncology

## 2023-06-03 ENCOUNTER — Other Ambulatory Visit: Payer: Self-pay

## 2023-06-03 ENCOUNTER — Telehealth: Payer: Self-pay

## 2023-06-03 ENCOUNTER — Ambulatory Visit
Admission: RE | Admit: 2023-06-03 | Discharge: 2023-06-03 | Disposition: A | Discharge status: Home / Self Care | Payer: Medicare Other | Source: Ambulatory Visit | Attending: Radiation Oncology | Admitting: Radiation Oncology

## 2023-06-03 DIAGNOSIS — Z510 Encounter for antineoplastic radiation therapy: Secondary | ICD-10-CM | POA: Diagnosis not present

## 2023-06-03 DIAGNOSIS — M810 Age-related osteoporosis without current pathological fracture: Secondary | ICD-10-CM | POA: Diagnosis not present

## 2023-06-03 DIAGNOSIS — I700 Atherosclerosis of aorta: Secondary | ICD-10-CM | POA: Diagnosis not present

## 2023-06-03 DIAGNOSIS — J439 Emphysema, unspecified: Secondary | ICD-10-CM | POA: Diagnosis not present

## 2023-06-03 DIAGNOSIS — Z96643 Presence of artificial hip joint, bilateral: Secondary | ICD-10-CM | POA: Diagnosis not present

## 2023-06-03 DIAGNOSIS — Z800 Family history of malignant neoplasm of digestive organs: Secondary | ICD-10-CM | POA: Diagnosis not present

## 2023-06-03 DIAGNOSIS — I5032 Chronic diastolic (congestive) heart failure: Secondary | ICD-10-CM | POA: Diagnosis not present

## 2023-06-03 DIAGNOSIS — L0390 Cellulitis, unspecified: Secondary | ICD-10-CM | POA: Diagnosis not present

## 2023-06-03 DIAGNOSIS — I110 Hypertensive heart disease with heart failure: Secondary | ICD-10-CM | POA: Diagnosis not present

## 2023-06-03 DIAGNOSIS — M85861 Other specified disorders of bone density and structure, right lower leg: Secondary | ICD-10-CM | POA: Diagnosis not present

## 2023-06-03 DIAGNOSIS — L03115 Cellulitis of right lower limb: Secondary | ICD-10-CM | POA: Diagnosis not present

## 2023-06-03 DIAGNOSIS — K5900 Constipation, unspecified: Secondary | ICD-10-CM | POA: Diagnosis not present

## 2023-06-03 DIAGNOSIS — C321 Malignant neoplasm of supraglottis: Secondary | ICD-10-CM | POA: Diagnosis not present

## 2023-06-03 DIAGNOSIS — M7989 Other specified soft tissue disorders: Secondary | ICD-10-CM | POA: Diagnosis not present

## 2023-06-03 DIAGNOSIS — Z23 Encounter for immunization: Secondary | ICD-10-CM | POA: Diagnosis not present

## 2023-06-03 DIAGNOSIS — C320 Malignant neoplasm of glottis: Secondary | ICD-10-CM | POA: Diagnosis not present

## 2023-06-03 DIAGNOSIS — E785 Hyperlipidemia, unspecified: Secondary | ICD-10-CM | POA: Diagnosis not present

## 2023-06-03 DIAGNOSIS — K219 Gastro-esophageal reflux disease without esophagitis: Secondary | ICD-10-CM | POA: Diagnosis not present

## 2023-06-03 DIAGNOSIS — E041 Nontoxic single thyroid nodule: Secondary | ICD-10-CM | POA: Diagnosis not present

## 2023-06-03 DIAGNOSIS — I70223 Atherosclerosis of native arteries of extremities with rest pain, bilateral legs: Secondary | ICD-10-CM | POA: Diagnosis not present

## 2023-06-03 DIAGNOSIS — Z79899 Other long term (current) drug therapy: Secondary | ICD-10-CM | POA: Diagnosis not present

## 2023-06-03 DIAGNOSIS — Z8249 Family history of ischemic heart disease and other diseases of the circulatory system: Secondary | ICD-10-CM | POA: Diagnosis not present

## 2023-06-03 DIAGNOSIS — I2510 Atherosclerotic heart disease of native coronary artery without angina pectoris: Secondary | ICD-10-CM | POA: Diagnosis not present

## 2023-06-03 DIAGNOSIS — Z951 Presence of aortocoronary bypass graft: Secondary | ICD-10-CM | POA: Diagnosis not present

## 2023-06-03 DIAGNOSIS — F17210 Nicotine dependence, cigarettes, uncomplicated: Secondary | ICD-10-CM | POA: Diagnosis not present

## 2023-06-03 DIAGNOSIS — Z8711 Personal history of peptic ulcer disease: Secondary | ICD-10-CM | POA: Diagnosis not present

## 2023-06-03 DIAGNOSIS — D539 Nutritional anemia, unspecified: Secondary | ICD-10-CM | POA: Diagnosis not present

## 2023-06-03 LAB — RAD ONC ARIA SESSION SUMMARY
Course Elapsed Days: 12
Plan Fractions Treated to Date: 9
Plan Prescribed Dose Per Fraction: 2 Gy
Plan Total Fractions Prescribed: 35
Plan Total Prescribed Dose: 70 Gy
Reference Point Dosage Given to Date: 18 Gy
Reference Point Session Dosage Given: 2 Gy
Session Number: 9

## 2023-06-03 NOTE — Telephone Encounter (Signed)
RN called pt family to let them know her lidocaine was ready at her pharmacy.

## 2023-06-03 NOTE — Progress Notes (Deleted)
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  She presents today for 1 year surveillance.  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  She is currently undergo radiation therapy for this, and is currently unable to have a CTA of her chest at this time.   Current Outpatient Medications on File Prior to Visit  Medication Sig Dispense Refill   albuterol (PROVENTIL HFA;VENTOLIN HFA) 108 (90 BASE) MCG/ACT inhaler Inhale 2 puffs into the lungs every 6 (six) hours as needed for wheezing or shortness of breath.     albuterol (PROVENTIL) (2.5 MG/3ML) 0.083% nebulizer solution Take 2.5 mg by nebulization every 6 (six) hours as needed for wheezing or shortness of breath.     alendronate (FOSAMAX) 70 MG tablet Take 70 mg by mouth every Wednesday.     allopurinol (ZYLOPRIM) 300 MG tablet Take 300 mg by mouth every evening.     aspirin EC 81 MG tablet Take 81 mg by mouth in the morning.     atorvastatin (LIPITOR) 80 MG tablet Take 80 mg by mouth at bedtime.     calcium carbonate (ANTACID) 500 MG chewable tablet Chew 1 tablet by mouth 2 (two) times daily as needed for heartburn or indigestion.     Cholecalciferol (VITAMIN D) 125 MCG (5000 UT) CAPS Take 5,000 Units by mouth in the morning.     clopidogrel (PLAVIX) 75 MG tablet Take 1 tablet (75 mg total) by mouth daily. (Patient taking differently: Take 75 mg by  mouth at bedtime.) 30 tablet 11   cyanocobalamin (VITAMIN B12) 1000 MCG tablet Take 1,000 mcg by mouth daily.     EPIPEN 2-PAK 0.3 MG/0.3ML SOAJ injection Inject 0.3 mg into the muscle daily as needed for anaphylaxis. Use as directed as needed for allergies.     ezetimibe (ZETIA) 10 MG tablet Take 1 tablet (10 mg total) by mouth daily. (Patient taking differently: Take 10 mg by mouth at bedtime.) 90 tablet 2   furosemide (LASIX) 40 MG tablet Take one tablet by mouth twice daily on Mondays, Wednesdays, and Fridays, then take one tablet by mouth daily on other days. 45 tablet 3   lactulose (CHRONULAC) 10 GM/15ML solution Take 20 g by mouth 2 (two) times daily as needed for moderate constipation.     lidocaine (XYLOCAINE) 2 % solution Patient: Mix 1part 2% viscous lidocaine, 1part H20. Swallow 10mL of diluted mixture, before meals and at bedtime, up to QID 200 mL 3   melatonin 5 MG TABS Take 5 mg by mouth at bedtime.     Menthol-Methyl Salicylate (MUSCLE RUB) 10-15 % CREA Apply 1 application topically daily as needed (for arthritis pain).     nitroGLYCERIN (NITROSTAT) 0.4 MG SL tablet Place 1 tablet (0.4 mg total) under the tongue every 5 (five) minutes  x 3 doses as needed for chest pain. 25 tablet 4   Oxycodone HCl 10 MG TABS Take 1 tablet (10 mg total) by mouth 4 (four) times daily as needed (for pain). 20 tablet 0   pantoprazole (PROTONIX) 40 MG tablet Take 40 mg by mouth 2 (two) times daily.     potassium chloride SA (KLOR-CON M20) 20 MEQ tablet Take one tablet by mouth twice daily on Mondays, Wednesdays, and Fridays, then take one tablet by mouth daily on other days. 45 tablet 3   senna-docusate (SENOKOT-S) 8.6-50 MG tablet Take 2 tablets by mouth at bedtime.     SPIRIVA RESPIMAT 2.5 MCG/ACT AERS Take 2 puffs by mouth daily.     spironolactone (ALDACTONE) 25 MG tablet Take 1 tablet (25 mg total) by mouth daily. 90 tablet 3   verapamil (CALAN-SR) 240 MG CR tablet Take 1 tablet (240 mg total)  by mouth daily. 90 tablet 2   Current Facility-Administered Medications on File Prior to Visit  Medication Dose Route Frequency Provider Last Rate Last Admin   DOBUTamine (DOBUTREX) 1,000 mcg/mL in dextrose 5% 250 mL infusion  30 mcg/kg/min Intravenous Titrated Lars Masson, MD         There were no vitals taken for this visit.  Physical Exam  CTA Results: Unable to be performed due to radiation therapy at this time  PET CT: (not test of choice)  Diffuse bronchial wall thickening with mild centrilobular and paraseptal emphysema. Scattered calcified granulomas are incidentally noted. Atherosclerosis of the thoracic aorta as well as the left main, left anterior descending, left circumflex and right coronary arteries. Aneurysmal dilatation of the distal descending thoracic aorta which measures approximately 4.5 x 4.1 cm shortly above the level of the aortic hiatus.   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 This is not documented on PET or recent lung cancer CT... 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 this should ideally be monitored by Vascular Surgery COPD CHF CAD   I think the best option is to allow patient to continue current radiation treatment.  We will have patient RTC in 6 months with repeat CTA of her chest.  If there is no further evidence of aortic arch involvement, will refer patient to Vascular surgery for all further surveillance.   Risk Modification:  Statin:  on Zetia  Smoking cessation instruction/counseling given:  Former smoker, has quit  Patient was counseled on importance of Blood Pressure Control.  Despite Medical intervention if the patient notices persistently elevated blood  pressure readings.  They are instructed to contact their Primary Care Physician  Please avoid use of Fluoroquinolones as this can potentially increase your risk of Aortic Rupture and/or Dissection  Patient educated on signs and symptoms of Aortic Dissection, handout also provided in AVS  Rolin Schult, PA-C 06/03/23

## 2023-06-04 ENCOUNTER — Ambulatory Visit
Admission: RE | Admit: 2023-06-04 | Discharge: 2023-06-04 | Disposition: A | Discharge status: Home / Self Care | Payer: Medicare Other | Source: Ambulatory Visit | Attending: Radiation Oncology | Admitting: Radiation Oncology

## 2023-06-04 ENCOUNTER — Other Ambulatory Visit: Payer: Self-pay

## 2023-06-04 ENCOUNTER — Ambulatory Visit: Payer: Medicare Other

## 2023-06-04 DIAGNOSIS — C321 Malignant neoplasm of supraglottis: Secondary | ICD-10-CM | POA: Diagnosis not present

## 2023-06-04 DIAGNOSIS — Z510 Encounter for antineoplastic radiation therapy: Secondary | ICD-10-CM | POA: Diagnosis not present

## 2023-06-04 LAB — RAD ONC ARIA SESSION SUMMARY
Course Elapsed Days: 13
Plan Fractions Treated to Date: 10
Plan Prescribed Dose Per Fraction: 2 Gy
Plan Total Fractions Prescribed: 35
Plan Total Prescribed Dose: 70 Gy
Reference Point Dosage Given to Date: 20 Gy
Reference Point Session Dosage Given: 2 Gy
Session Number: 10

## 2023-06-05 ENCOUNTER — Encounter (HOSPITAL_COMMUNITY): Payer: Self-pay | Admitting: *Deleted

## 2023-06-05 ENCOUNTER — Emergency Department (HOSPITAL_COMMUNITY)
Admit: 2023-06-05 | Discharge: 2023-06-05 | Disposition: A | Discharge status: Home / Self Care | Payer: Medicare Other | Attending: Student | Admitting: Student

## 2023-06-05 ENCOUNTER — Inpatient Hospital Stay (HOSPITAL_COMMUNITY)
Admission: EM | Admit: 2023-06-05 | Discharge: 2023-06-11 | DRG: 603 | Disposition: A | Discharge status: Home / Self Care | Payer: Medicare Other | Attending: Internal Medicine | Admitting: Internal Medicine

## 2023-06-05 ENCOUNTER — Other Ambulatory Visit: Payer: Self-pay

## 2023-06-05 ENCOUNTER — Emergency Department (HOSPITAL_COMMUNITY): Payer: Medicare Other

## 2023-06-05 ENCOUNTER — Ambulatory Visit
Admission: RE | Admit: 2023-06-05 | Discharge: 2023-06-05 | Disposition: A | Discharge status: Home / Self Care | Payer: Medicare Other | Source: Ambulatory Visit | Attending: Radiation Oncology

## 2023-06-05 ENCOUNTER — Inpatient Hospital Stay: Payer: Medicare Other | Admitting: Dietician

## 2023-06-05 DIAGNOSIS — K219 Gastro-esophageal reflux disease without esophagitis: Secondary | ICD-10-CM | POA: Diagnosis present

## 2023-06-05 DIAGNOSIS — Z7983 Long term (current) use of bisphosphonates: Secondary | ICD-10-CM

## 2023-06-05 DIAGNOSIS — Z888 Allergy status to other drugs, medicaments and biological substances status: Secondary | ICD-10-CM

## 2023-06-05 DIAGNOSIS — R7303 Prediabetes: Secondary | ICD-10-CM | POA: Diagnosis present

## 2023-06-05 DIAGNOSIS — Z90710 Acquired absence of both cervix and uterus: Secondary | ICD-10-CM

## 2023-06-05 DIAGNOSIS — I70223 Atherosclerosis of native arteries of extremities with rest pain, bilateral legs: Secondary | ICD-10-CM | POA: Diagnosis present

## 2023-06-05 DIAGNOSIS — I110 Hypertensive heart disease with heart failure: Secondary | ICD-10-CM | POA: Diagnosis present

## 2023-06-05 DIAGNOSIS — D539 Nutritional anemia, unspecified: Secondary | ICD-10-CM | POA: Diagnosis present

## 2023-06-05 DIAGNOSIS — I700 Atherosclerosis of aorta: Secondary | ICD-10-CM | POA: Diagnosis present

## 2023-06-05 DIAGNOSIS — J439 Emphysema, unspecified: Secondary | ICD-10-CM | POA: Diagnosis present

## 2023-06-05 DIAGNOSIS — Z800 Family history of malignant neoplasm of digestive organs: Secondary | ICD-10-CM | POA: Diagnosis not present

## 2023-06-05 DIAGNOSIS — L0390 Cellulitis, unspecified: Secondary | ICD-10-CM | POA: Diagnosis not present

## 2023-06-05 DIAGNOSIS — Z8249 Family history of ischemic heart disease and other diseases of the circulatory system: Secondary | ICD-10-CM | POA: Diagnosis not present

## 2023-06-05 DIAGNOSIS — Z23 Encounter for immunization: Secondary | ICD-10-CM

## 2023-06-05 DIAGNOSIS — C321 Malignant neoplasm of supraglottis: Secondary | ICD-10-CM | POA: Diagnosis present

## 2023-06-05 DIAGNOSIS — C320 Malignant neoplasm of glottis: Secondary | ICD-10-CM | POA: Diagnosis present

## 2023-06-05 DIAGNOSIS — M810 Age-related osteoporosis without current pathological fracture: Secondary | ICD-10-CM | POA: Diagnosis present

## 2023-06-05 DIAGNOSIS — F419 Anxiety disorder, unspecified: Secondary | ICD-10-CM | POA: Diagnosis present

## 2023-06-05 DIAGNOSIS — Z8711 Personal history of peptic ulcer disease: Secondary | ICD-10-CM

## 2023-06-05 DIAGNOSIS — Z7982 Long term (current) use of aspirin: Secondary | ICD-10-CM

## 2023-06-05 DIAGNOSIS — Z91040 Latex allergy status: Secondary | ICD-10-CM

## 2023-06-05 DIAGNOSIS — Z510 Encounter for antineoplastic radiation therapy: Secondary | ICD-10-CM | POA: Diagnosis not present

## 2023-06-05 DIAGNOSIS — R918 Other nonspecific abnormal finding of lung field: Secondary | ICD-10-CM | POA: Diagnosis not present

## 2023-06-05 DIAGNOSIS — Z79899 Other long term (current) drug therapy: Secondary | ICD-10-CM

## 2023-06-05 DIAGNOSIS — I10 Essential (primary) hypertension: Secondary | ICD-10-CM | POA: Diagnosis present

## 2023-06-05 DIAGNOSIS — R109 Unspecified abdominal pain: Secondary | ICD-10-CM | POA: Diagnosis not present

## 2023-06-05 DIAGNOSIS — K5900 Constipation, unspecified: Secondary | ICD-10-CM | POA: Diagnosis not present

## 2023-06-05 DIAGNOSIS — I2510 Atherosclerotic heart disease of native coronary artery without angina pectoris: Secondary | ICD-10-CM | POA: Diagnosis present

## 2023-06-05 DIAGNOSIS — Z7902 Long term (current) use of antithrombotics/antiplatelets: Secondary | ICD-10-CM

## 2023-06-05 DIAGNOSIS — M79606 Pain in leg, unspecified: Secondary | ICD-10-CM | POA: Diagnosis not present

## 2023-06-05 DIAGNOSIS — R0602 Shortness of breath: Secondary | ICD-10-CM | POA: Diagnosis not present

## 2023-06-05 DIAGNOSIS — I739 Peripheral vascular disease, unspecified: Secondary | ICD-10-CM | POA: Diagnosis not present

## 2023-06-05 DIAGNOSIS — L03115 Cellulitis of right lower limb: Principal | ICD-10-CM | POA: Diagnosis present

## 2023-06-05 DIAGNOSIS — F17210 Nicotine dependence, cigarettes, uncomplicated: Secondary | ICD-10-CM | POA: Diagnosis present

## 2023-06-05 DIAGNOSIS — I5032 Chronic diastolic (congestive) heart failure: Secondary | ICD-10-CM | POA: Diagnosis present

## 2023-06-05 DIAGNOSIS — Z96643 Presence of artificial hip joint, bilateral: Secondary | ICD-10-CM | POA: Diagnosis present

## 2023-06-05 DIAGNOSIS — Z951 Presence of aortocoronary bypass graft: Secondary | ICD-10-CM

## 2023-06-05 DIAGNOSIS — E785 Hyperlipidemia, unspecified: Secondary | ICD-10-CM | POA: Diagnosis present

## 2023-06-05 DIAGNOSIS — M7989 Other specified soft tissue disorders: Secondary | ICD-10-CM | POA: Diagnosis not present

## 2023-06-05 DIAGNOSIS — Z886 Allergy status to analgesic agent status: Secondary | ICD-10-CM

## 2023-06-05 DIAGNOSIS — Z720 Tobacco use: Secondary | ICD-10-CM | POA: Diagnosis not present

## 2023-06-05 DIAGNOSIS — I708 Atherosclerosis of other arteries: Secondary | ICD-10-CM | POA: Diagnosis present

## 2023-06-05 DIAGNOSIS — R140 Abdominal distension (gaseous): Secondary | ICD-10-CM | POA: Diagnosis not present

## 2023-06-05 DIAGNOSIS — E782 Mixed hyperlipidemia: Secondary | ICD-10-CM | POA: Diagnosis not present

## 2023-06-05 DIAGNOSIS — I712 Thoracic aortic aneurysm, without rupture, unspecified: Secondary | ICD-10-CM | POA: Diagnosis present

## 2023-06-05 DIAGNOSIS — M1990 Unspecified osteoarthritis, unspecified site: Secondary | ICD-10-CM | POA: Diagnosis present

## 2023-06-05 DIAGNOSIS — J449 Chronic obstructive pulmonary disease, unspecified: Secondary | ICD-10-CM | POA: Diagnosis present

## 2023-06-05 DIAGNOSIS — Z91030 Bee allergy status: Secondary | ICD-10-CM

## 2023-06-05 DIAGNOSIS — M85861 Other specified disorders of bone density and structure, right lower leg: Secondary | ICD-10-CM | POA: Diagnosis not present

## 2023-06-05 DIAGNOSIS — E041 Nontoxic single thyroid nodule: Secondary | ICD-10-CM | POA: Diagnosis present

## 2023-06-05 DIAGNOSIS — Z91038 Other insect allergy status: Secondary | ICD-10-CM

## 2023-06-05 DIAGNOSIS — J42 Unspecified chronic bronchitis: Secondary | ICD-10-CM | POA: Diagnosis not present

## 2023-06-05 DIAGNOSIS — Z87820 Personal history of traumatic brain injury: Secondary | ICD-10-CM

## 2023-06-05 LAB — CBC WITH DIFFERENTIAL/PLATELET
Abs Immature Granulocytes: 0.08 10*3/uL — ABNORMAL HIGH (ref 0.00–0.07)
Basophils Absolute: 0 10*3/uL (ref 0.0–0.1)
Basophils Relative: 1 %
Eosinophils Absolute: 0.2 10*3/uL (ref 0.0–0.5)
Eosinophils Relative: 4 %
HCT: 35.6 % — ABNORMAL LOW (ref 36.0–46.0)
Hemoglobin: 11.5 g/dL — ABNORMAL LOW (ref 12.0–15.0)
Immature Granulocytes: 2 %
Lymphocytes Relative: 14 %
Lymphs Abs: 0.7 10*3/uL (ref 0.7–4.0)
MCH: 32.8 pg (ref 26.0–34.0)
MCHC: 32.3 g/dL (ref 30.0–36.0)
MCV: 101.4 fL — ABNORMAL HIGH (ref 80.0–100.0)
Monocytes Absolute: 0.7 10*3/uL (ref 0.1–1.0)
Monocytes Relative: 14 %
Neutro Abs: 3.4 10*3/uL (ref 1.7–7.7)
Neutrophils Relative %: 65 %
Platelets: 211 10*3/uL (ref 150–400)
RBC: 3.51 MIL/uL — ABNORMAL LOW (ref 3.87–5.11)
RDW: 20.2 % — ABNORMAL HIGH (ref 11.5–15.5)
WBC: 5.2 10*3/uL (ref 4.0–10.5)
nRBC: 0.4 % — ABNORMAL HIGH (ref 0.0–0.2)

## 2023-06-05 LAB — COMPREHENSIVE METABOLIC PANEL WITH GFR
ALT: 21 U/L (ref 0–44)
AST: 21 U/L (ref 15–41)
Albumin: 3.8 g/dL (ref 3.5–5.0)
Alkaline Phosphatase: 54 U/L (ref 38–126)
Anion gap: 10 (ref 5–15)
BUN: 38 mg/dL — ABNORMAL HIGH (ref 8–23)
CO2: 33 mmol/L — ABNORMAL HIGH (ref 22–32)
Calcium: 9.9 mg/dL (ref 8.9–10.3)
Chloride: 92 mmol/L — ABNORMAL LOW (ref 98–111)
Creatinine, Ser: 0.79 mg/dL (ref 0.44–1.00)
GFR, Estimated: >60 mL/min (ref >60)
Glucose, Bld: 93 mg/dL (ref 70–99)
Potassium: 4.3 mmol/L (ref 3.5–5.1)
Sodium: 135 mmol/L (ref 135–145)
Total Bilirubin: 0.8 mg/dL (ref 0.3–1.2)
Total Protein: 7.4 g/dL (ref 6.5–8.1)

## 2023-06-05 LAB — RAD ONC ARIA SESSION SUMMARY
Course Elapsed Days: 14
Plan Fractions Treated to Date: 11
Plan Prescribed Dose Per Fraction: 2 Gy
Plan Total Fractions Prescribed: 35
Plan Total Prescribed Dose: 70 Gy
Reference Point Dosage Given to Date: 22 Gy
Reference Point Session Dosage Given: 2 Gy
Session Number: 11

## 2023-06-05 MED ORDER — POTASSIUM CHLORIDE CRYS ER 20 MEQ PO TBCR
20.0000 meq | EXTENDED_RELEASE_TABLET | ORAL | Status: DC
  Administered 2023-06-06 – 2023-06-11 (×4): 20 meq via ORAL
  Filled 2023-06-05 (×4): qty 1

## 2023-06-05 MED ORDER — FENTANYL CITRATE PF 50 MCG/ML IJ SOSY
50.0000 ug | PREFILLED_SYRINGE | Freq: Once | INTRAMUSCULAR | Status: AC
  Administered 2023-06-05: 50 ug via INTRAVENOUS
  Filled 2023-06-05: qty 1

## 2023-06-05 MED ORDER — NITROGLYCERIN 0.4 MG SL SUBL: 0.4000 mg | SUBLINGUAL_TABLET | SUBLINGUAL | Status: DC | PRN

## 2023-06-05 MED ORDER — OXYCODONE HCL 5 MG PO TABS
10.0000 mg | ORAL_TABLET | Freq: Four times a day (QID) | ORAL | Status: DC | PRN
  Administered 2023-06-05 – 2023-06-11 (×18): 10 mg via ORAL
  Filled 2023-06-05 (×19): qty 2

## 2023-06-05 MED ORDER — POTASSIUM CHLORIDE CRYS ER 20 MEQ PO TBCR: 20.0000 meq | EXTENDED_RELEASE_TABLET | ORAL | Status: DC

## 2023-06-05 MED ORDER — ALBUTEROL SULFATE (2.5 MG/3ML) 0.083% IN NEBU
2.5000 mg | INHALATION_SOLUTION | Freq: Four times a day (QID) | RESPIRATORY_TRACT | Status: DC | PRN
  Administered 2023-06-06 – 2023-06-08 (×2): 2.5 mg via RESPIRATORY_TRACT
  Filled 2023-06-05 (×2): qty 3

## 2023-06-05 MED ORDER — FUROSEMIDE 40 MG PO TABS
40.0000 mg | ORAL_TABLET | ORAL | Status: DC
  Administered 2023-06-06 – 2023-06-11 (×4): 40 mg via ORAL
  Filled 2023-06-05 (×4): qty 1

## 2023-06-05 MED ORDER — OXYCODONE-ACETAMINOPHEN 5-325 MG PO TABS
2.0000 | ORAL_TABLET | Freq: Once | ORAL | Status: AC
  Administered 2023-06-05: 2 via ORAL
  Filled 2023-06-05: qty 2

## 2023-06-05 MED ORDER — ASPIRIN 81 MG PO TBEC
81.0000 mg | DELAYED_RELEASE_TABLET | Freq: Every day | ORAL | Status: DC
  Administered 2023-06-06 – 2023-06-11 (×6): 81 mg via ORAL
  Filled 2023-06-05 (×6): qty 1

## 2023-06-05 MED ORDER — VERAPAMIL HCL ER 240 MG PO TBCR
240.0000 mg | EXTENDED_RELEASE_TABLET | Freq: Every day | ORAL | Status: DC
  Administered 2023-06-05 – 2023-06-11 (×7): 240 mg via ORAL
  Filled 2023-06-05 (×7): qty 1

## 2023-06-05 MED ORDER — FUROSEMIDE 40 MG PO TABS
40.0000 mg | ORAL_TABLET | ORAL | Status: DC
  Administered 2023-06-05 – 2023-06-10 (×4): 40 mg via ORAL
  Filled 2023-06-05 (×5): qty 1

## 2023-06-05 MED ORDER — UMECLIDINIUM BROMIDE 62.5 MCG/ACT IN AEPB
1.0000 | INHALATION_SPRAY | Freq: Every day | RESPIRATORY_TRACT | Status: DC
  Administered 2023-06-06 – 2023-06-11 (×6): 1 via RESPIRATORY_TRACT
  Filled 2023-06-05: qty 7

## 2023-06-05 MED ORDER — TIOTROPIUM BROMIDE MONOHYDRATE 2.5 MCG/ACT IN AERS: 2.0000 | INHALATION_SPRAY | Freq: Every day | RESPIRATORY_TRACT | Status: DC

## 2023-06-05 MED ORDER — ENSURE ENLIVE PO LIQD
237.0000 mL | Freq: Two times a day (BID) | ORAL | Status: DC
  Administered 2023-06-06 – 2023-06-11 (×12): 237 mL via ORAL

## 2023-06-05 MED ORDER — SODIUM CHLORIDE 0.9 % IV SOLN
2.0000 g | Freq: Once | INTRAVENOUS | Status: AC
  Administered 2023-06-05: 2 g via INTRAVENOUS
  Filled 2023-06-05: qty 20

## 2023-06-05 MED ORDER — SODIUM CHLORIDE 0.9 % IV SOLN
1.0000 g | INTRAVENOUS | Status: DC
  Administered 2023-06-06 – 2023-06-11 (×6): 1 g via INTRAVENOUS
  Filled 2023-06-05 (×6): qty 10

## 2023-06-05 MED ORDER — EZETIMIBE 10 MG PO TABS
10.0000 mg | ORAL_TABLET | Freq: Every day | ORAL | Status: DC
  Administered 2023-06-05 – 2023-06-10 (×6): 10 mg via ORAL
  Filled 2023-06-05 (×6): qty 1

## 2023-06-05 MED ORDER — CLOPIDOGREL BISULFATE 75 MG PO TABS
75.0000 mg | ORAL_TABLET | Freq: Every day | ORAL | Status: DC
  Administered 2023-06-05 – 2023-06-10 (×6): 75 mg via ORAL
  Filled 2023-06-05 (×6): qty 1

## 2023-06-05 MED ORDER — POTASSIUM CHLORIDE CRYS ER 20 MEQ PO TBCR
40.0000 meq | EXTENDED_RELEASE_TABLET | ORAL | Status: DC
  Administered 2023-06-07 – 2023-06-10 (×2): 40 meq via ORAL
  Filled 2023-06-05 (×2): qty 2

## 2023-06-05 MED ORDER — PANTOPRAZOLE SODIUM 40 MG PO TBEC
40.0000 mg | DELAYED_RELEASE_TABLET | Freq: Two times a day (BID) | ORAL | Status: DC
  Administered 2023-06-05 – 2023-06-11 (×12): 40 mg via ORAL
  Filled 2023-06-05 (×12): qty 1

## 2023-06-05 MED ORDER — INFLUENZA VAC A&B SURF ANT ADJ 0.5 ML IM SUSY
0.5000 mL | PREFILLED_SYRINGE | INTRAMUSCULAR | Status: DC
  Filled 2023-06-05 (×2): qty 0.5

## 2023-06-05 MED ORDER — LACTULOSE 10 GM/15ML PO SOLN
20.0000 g | Freq: Two times a day (BID) | ORAL | Status: DC | PRN
  Administered 2023-06-06 – 2023-06-11 (×7): 20 g via ORAL
  Filled 2023-06-05 (×8): qty 30

## 2023-06-05 MED ORDER — MELATONIN 5 MG PO TABS
5.0000 mg | ORAL_TABLET | Freq: Every day | ORAL | Status: DC
  Administered 2023-06-05 – 2023-06-10 (×6): 5 mg via ORAL
  Filled 2023-06-05 (×6): qty 1

## 2023-06-05 MED ORDER — SPIRONOLACTONE 25 MG PO TABS
25.0000 mg | ORAL_TABLET | Freq: Every day | ORAL | Status: DC
  Administered 2023-06-05 – 2023-06-11 (×7): 25 mg via ORAL
  Filled 2023-06-05 (×7): qty 1

## 2023-06-05 MED ORDER — ALLOPURINOL 300 MG PO TABS
300.0000 mg | ORAL_TABLET | Freq: Every evening | ORAL | Status: DC
  Administered 2023-06-05 – 2023-06-10 (×6): 300 mg via ORAL
  Filled 2023-06-05 (×6): qty 1

## 2023-06-05 MED ORDER — FUROSEMIDE 40 MG PO TABS: 40.0000 mg | ORAL_TABLET | Freq: Every day | ORAL | Status: DC

## 2023-06-05 MED ORDER — CALCIUM CARBONATE ANTACID 500 MG PO CHEW: 1.0000 | CHEWABLE_TABLET | Freq: Two times a day (BID) | ORAL | Status: DC | PRN

## 2023-06-05 MED ORDER — ENOXAPARIN SODIUM 40 MG/0.4ML IJ SOSY
40.0000 mg | PREFILLED_SYRINGE | INTRAMUSCULAR | Status: DC
  Administered 2023-06-05 – 2023-06-06 (×2): 40 mg via SUBCUTANEOUS
  Filled 2023-06-05 (×5): qty 0.4

## 2023-06-05 MED ORDER — ATORVASTATIN CALCIUM 40 MG PO TABS
80.0000 mg | ORAL_TABLET | Freq: Every day | ORAL | Status: DC
  Administered 2023-06-05 – 2023-06-10 (×6): 80 mg via ORAL
  Filled 2023-06-05 (×6): qty 2

## 2023-06-05 NOTE — ED Provider Notes (Signed)
[facility] Provider Note  CSN: [id number] Arrival date & time: 06/05/23 4098  Chief Complaint(s) Leg Injury  HPI Marie Johnson is a 72 y.o. female with PMH CAD, CHF, PAD, AAA, COPD, HTN, HLD, laryngeal cancer actively receiving radiation therapy who presents emergency department for evaluation of right lower extremity erythema pain and swelling.  She states that she "bumped" her leg 3 to 4 days ago and overnight had severe worsening of the erythema particular over the anterior surface of the right leg.  Endorses associated tenderness to palpation but denies fever, chest pain, shortness of breath, abdominal pain, nausea, vomiting or other systemic symptoms.  Patient went to her scheduled radiation treatment today where she was noticed to have development of this erythema and transfer to the emergency department for further evaluation.   Past Medical History Past Medical History:  Diagnosis Date   AAA (abdominal aortic aneurysm) (HCC)    3.4 cm   Anemia    Anxiety    Aortic atherosclerosis (HCC)    Arthritis    Asthma    CAD (coronary artery disease)    a. Nonobstructive CAD by cath 07/17/13.   Cancer [facility])    Carotid artery disease (HCC)    Carotid US 8/22: Bilateral ICA 1-39; right subclavian stenosis; right thyroid nodule   CHD (congenital heart disease)    pt unaware???   Chest pain    a. Adm 10-07/2013 - CTA neg for PE/dissection, cath with nonobstructive disease, CP ?due to uncontrolled HTN vs coronary vasospasm.   CHF (congestive heart failure) (HCC)    Closed head injury with concussion    COPD (chronic obstructive pulmonary disease) (HCC)    Coronary artery disease    Echocardiogram    Echocardiogram 06/2019: EF 60-65, impaired relaxation (Gr 1 DD), mild MAC, mild MR, mild to mod aortic valve sclerosis (no AS), trivial TR, mild LAE, normal RVSF, RVSP 30.7 (mildly elevated)   GERD (gastroesophageal reflux disease)    History of  blood transfusion    childbirth, Hysterectomy   Hyperlipidemia    Hypertension    Neuropathy    OA (osteoarthritis)    Osteoporosis    PAD (peripheral artery disease) (HCC)    a. s/p L fem-pop bypass b. recent evaluation at [facility], unamenable to intervention   PAD (peripheral artery disease) (HCC)    Palpitations 02/22/2016   Pneumonia    Pre-diabetes    Premature ventricular contraction    PUD (peptic ulcer disease)    PVD (peripheral vascular disease) (HCC) 07/17/2013   Thoracic aortic aneurysm (HCC)    Chest/aorta CTA 04/2022: 14 x 8 focal saccular aneurysm versus penetrating atherosclerotic ulcer involving left side of proximal portion of transverse aortic arch; descending thoracic aorta and proximal abdominal aorta aneurysm 4.4 cm; aortic atherosclerosis; emphysema   Tobacco abuse    Wears dentures    Wears glasses    Patient Active Problem List   Diagnosis Date Noted   Cellulitis of right lower extremity 06/05/2023   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   Demand ischemia 04/06/2023   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   Critical limb ischemia of left lower extremity (HCC) 07/26/2022   Thoracic aortic aneurysm (HCC) 04/22/2022   PAD (peripheral artery disease) (HCC) 03/18/2020   Critical limb ischemia with history of revascularization of same extremity (HCC) 03/08/2020   Chronic diastolic CHF (congestive heart failure) (HCC) 07/14/2018   Acute sinusitis  12/05/2016   Premature ventricular contraction 03/26/2016   Chest pain 02/22/2016   Palpitations 02/22/2016   Coronary artery disease involving native coronary artery of native heart 07/17/2013   PVD (peripheral vascular disease) (HCC) 07/17/2013   Aortic atherosclerosis (HCC) 07/17/2013   AAA (abdominal aortic aneurysm) (HCC) 07/17/2013   Tobacco abuse 07/17/2013   Hypertension 07/17/2013   Hyperlipidemia 07/17/2013   COPD (chronic obstructive  pulmonary disease) (HCC) 07/17/2013   GERD (gastroesophageal reflux disease) 07/17/2013   Home Medication(s) Prior to Admission medications   Medication Sig Start Date End Date Taking? Authorizing Provider  albuterol (PROVENTIL HFA;VENTOLIN HFA) 108 (90 BASE) MCG/ACT inhaler Inhale 2 puffs into the lungs every 6 (six) hours as needed for wheezing or shortness of breath.   Yes [provider]  albuterol (PROVENTIL) (2.5 MG/3ML) 0.083% nebulizer solution Take 2.5 mg by nebulization every 6 (six) hours as needed for wheezing or shortness of breath.   Yes [provider]  alendronate (FOSAMAX) 70 MG tablet Take 70 mg by mouth every Wednesday. 02/03/16  Yes [provider]  allopurinol (ZYLOPRIM) 300 MG tablet Take 300 mg by mouth every evening. 08/02/17  Yes [provider]  aspirin EC 81 MG tablet Take 81 mg by mouth in the morning.   Yes [provider]  atorvastatin (LIPITOR) 80 MG tablet Take 80 mg by mouth at bedtime. 02/04/20  Yes [provider]  calcium carbonate (ANTACID) 500 MG chewable tablet Chew 1 tablet by mouth 2 (two) times daily as needed for heartburn or indigestion.   Yes [provider]  Cholecalciferol (VITAMIN D) 125 MCG (5000 UT) CAPS Take 5,000 Units by mouth in the morning.   Yes [provider]  clopidogrel (PLAVIX) 75 MG tablet Take 1 tablet (75 mg total) by mouth daily. Patient taking differently: Take 75 mg by mouth at bedtime. 07/27/22 07/27/23 Yes Lars Mage, PA-C  cyanocobalamin (VITAMIN B12) 1000 MCG tablet Take 1,000 mcg by mouth daily.   Yes [provider]  EPIPEN 2-PAK 0.3 MG/0.3ML SOAJ injection Inject 0.3 mg into the muscle daily as needed for anaphylaxis. Use as directed as needed for allergies. 12/14/15  Yes [provider]  ezetimibe (ZETIA) 10 MG tablet Take 1 tablet (10 mg total) by mouth daily. Patient taking differently: Take 10 mg by mouth at bedtime. 03/29/23  Yes  Meriam Sprague, MD  furosemide (LASIX) 40 MG tablet Take one tablet by mouth twice daily on Mondays, Wednesdays, and Fridays, then take one tablet by mouth daily on other days. Patient taking differently: Take 40 mg by mouth daily. 05/23/23  Yes Nahser, Deloris Ping, MD  lactulose (CHRONULAC) 10 GM/15ML solution Take 20 g by mouth 2 (two) times daily as needed for moderate constipation. 10/29/19  Yes [provider]  melatonin 5 MG TABS Take 5 mg by mouth at bedtime.   Yes [provider]  nitroGLYCERIN (NITROSTAT) 0.4 MG SL tablet Place 1 tablet (0.4 mg total) under the tongue every 5 (five) minutes x 3 doses as needed for chest pain. 04/26/16  Yes Seiler, Amber K, NP  Oxycodone HCl 10 MG TABS Take 1 tablet (10 mg total) by mouth 4 (four) times daily as needed (for pain). 03/22/20  Yes Emilie Rutter, PA-C  pantoprazole (PROTONIX) 40 MG tablet Take 40 mg by mouth 2 (two) times daily.   Yes [provider]  potassium chloride SA (KLOR-CON M20) 20 MEQ tablet Take one tablet by mouth twice daily on Mondays, Wednesdays,  and Fridays, then take one tablet by mouth daily on other days. Patient taking differently: Take 20 mEq by mouth See admin instructions. Take one tablet by mouth twice daily on Mondays, Wednesdays, and Fridays, then take one tablet by mouth daily on other days. 05/23/23  Yes Nahser, Deloris Ping, MD  senna-docusate (SENOKOT-S) 8.6-50 MG tablet Take 2 tablets by mouth at bedtime.   Yes [provider]  SPIRIVA RESPIMAT 2.5 MCG/ACT AERS Take 2 puffs by mouth daily. 04/03/23  Yes [provider]  spironolactone (ALDACTONE) 25 MG tablet Take 1 tablet (25 mg total) by mouth daily. 09/03/17  Yes Lars Masson, MD  verapamil (CALAN-SR) 240 MG CR tablet Take 1 tablet (240 mg total) by mouth daily. 03/29/23  Yes Meriam Sprague, MD  lidocaine (XYLOCAINE) 2 % solution Patient: Mix 1part 2% viscous lidocaine, 1part H20. Swallow 10mL of diluted mixture,  before meals and at bedtime, up to QID Patient not taking: Reported on 06/05/2023 05/28/23   Lonie Peak, MD                                                                                                                                    Past Surgical History Past Surgical History:  Procedure Laterality Date   ABDOMINAL AORTOGRAM W/LOWER EXTREMITY N/A 03/09/2020   Procedure: ABDOMINAL AORTOGRAM W/LOWER EXTREMITY;  Surgeon: Cephus Shelling, MD;  Location: [facility] INVASIVE CV LAB;  Service: Cardiovascular;  Laterality: N/A;   ABDOMINAL AORTOGRAM W/LOWER EXTREMITY N/A 07/26/2022   Procedure: ABDOMINAL AORTOGRAM W/LOWER EXTREMITY;  Surgeon: Cephus Shelling, MD;  Location: [facility] INVASIVE CV LAB;  Service: Cardiovascular;  Laterality: N/A;   ABDOMINAL AORTOGRAM W/LOWER EXTREMITY N/A 09/20/2022   Procedure: ABDOMINAL AORTOGRAM W/LOWER EXTREMITY;  Surgeon: Cephus Shelling, MD;  Location: [facility] INVASIVE CV LAB;  Service: Cardiovascular;  Laterality: N/A;   ABDOMINAL HYSTERECTOMY     BREAST SURGERY     left cyst excision x 2   CARDIAC CATHETERIZATION     several years ago, nonobstructive, "50-60% blockages"   DIRECT LARYNGOSCOPY Bilateral 03/13/2023   Procedure: MICRO DIRECT LARYNGOSCOPY WITH BIOPSY;  Surgeon: Christia Reading, MD;  Location: [facility] OR;  Service: ENT;  Laterality: Bilateral;   ENDARTERECTOMY FEMORAL Right 03/18/2020   Procedure: ENDARTERECTOMY FEMORAL WITH PROFUNDAPLASTY;  Surgeon: Cephus Shelling, MD;  Location: [facility] OR;  Service: Vascular;  Laterality: Right;   FEMORAL-POPLITEAL BYPASS GRAFT Right 03/18/2020   Procedure: BYPASS GRAFT FEMORAL-POPLITEAL ARTERY using GORE PROPATEN VASCULAR GRAFT;  Surgeon: Cephus Shelling, MD;  Location: [facility] OR;  Service: Vascular;  Laterality: Right;   HIP SURGERY     left - bars and screws placed   HIP SURGERY     INSERTION OF ILIAC STENT Right 03/18/2020   Procedure: INSERTION OF GORE VIABAHN  ILIAC STENT;  Surgeon: Cephus Shelling, MD;   Location: [facility] OR;  Service: Vascular;  Laterality: Right;   LEFT HEART  CATH AND CORONARY ANGIOGRAPHY N/A 04/08/2023   Procedure: LEFT HEART CATH AND CORONARY ANGIOGRAPHY;  Surgeon: Marykay Lex, MD;  Location: [facility] INVASIVE CV LAB;  Service: Cardiovascular;  Laterality: N/A;   LEFT HEART CATHETERIZATION WITH CORONARY ANGIOGRAM N/A 07/17/2013   Procedure: LEFT HEART CATHETERIZATION WITH CORONARY ANGIOGRAM;  Surgeon: Laurey Morale, MD;  Location: [facility] CATH LAB;  Service: Cardiovascular;  Laterality: N/A;   LOWER EXTREMITY ANGIOGRAM Right 03/18/2020   Procedure: RIGHT ILIAC ARTERIOGRAM WITH AN ILIAC STENT;  Surgeon: Cephus Shelling, MD;  Location: [facility] OR;  Service: Vascular;  Laterality: Right;   MULTIPLE TOOTH EXTRACTIONS     PATCH ANGIOPLASTY Right 03/18/2020   Procedure: PATCH ANGIOPLASTY using a Livia Snellen BIOLOGIC PATCH OF THE PROFUNDA;  Surgeon: Cephus Shelling, MD;  Location: [facility] OR;  Service: Vascular;  Laterality: Right;   PERIPHERAL VASCULAR CATHETERIZATION N/A 07/16/2016   Procedure: Abdominal Aortogram w/Lower Extremity;  Surgeon: Larina Earthly, MD;  Location: [facility] INVASIVE CV LAB;  Service: Cardiovascular;  Laterality: N/A;   PERIPHERAL VASCULAR INTERVENTION Left 03/09/2020   Procedure: PERIPHERAL VASCULAR INTERVENTION;  Surgeon: Cephus Shelling, MD;  Location: [facility] INVASIVE CV LAB;  Service: Cardiovascular;  Laterality: Left;  common and external illiac    ROTATOR CUFF REPAIR     right side, had torn ligaments as well   TUBAL LIGATION     VEIN BYPASS SURGERY     left leg   WRIST ARTHROCENTESIS     left - broke in 3 places so has bars and screws placed   Family History Family History  Problem Relation Age of Onset   Cervical cancer Mother    Heart disease Mother        MI in 37s   Liver cancer Father    Heart disease Father    Lung cancer Maternal Aunt    Prostate cancer Maternal Uncle        met. anal   Breast cancer Maternal Grandmother    Lung cancer Maternal Aunt     Lung cancer Maternal Aunt    Stomach cancer Maternal Aunt    Colon cancer Maternal Aunt    Lung cancer Maternal Uncle     Social History Social History   Tobacco Use   Smoking status: Former    Current packs/day: 0.50    Average packs/day: 0.5 packs/day for 57.0 years (28.5 ttl pk-yrs)    Types: Cigarettes    Passive exposure: Current (Spouse is a smoker)   Smokeless tobacco: Never  Vaping Use   Vaping status: Never Used  Substance Use Topics   Alcohol use: Never   Drug use: Never   Allergies Bee venom, Insect extract, Farxiga [dapagliflozin], Lyrica [pregabalin], Neurontin [gabapentin], Nsaids, and Latex  Review of Systems Review of Systems  Skin:  Positive for rash.    Physical Exam Vital Signs  I have reviewed the triage vital signs BP 139/62 (BP Location: Right Arm)   Pulse 97   Temp 98.2 F (36.8 C) (Oral)   Resp 16   Ht 5' 7.5" (1.715 m)   Wt 59 kg   SpO2 95%   BMI 20.06 kg/m   Physical Exam Vitals and nursing note reviewed.  Constitutional:      General: She is not in acute distress.    Appearance: She is well-developed.  HENT:     Head: Normocephalic and atraumatic.  Eyes:     Conjunctiva/sclera: Conjunctivae normal.  Cardiovascular:     Rate and Rhythm: Normal  rate and regular rhythm.     Heart sounds: No murmur heard. Pulmonary:     Effort: Pulmonary effort is normal. No respiratory distress.     Breath sounds: Normal breath sounds.  Abdominal:     Palpations: Abdomen is soft.     Tenderness: There is no abdominal tenderness.  Musculoskeletal:        General: No swelling.     Cervical back: Neck supple.  Skin:    General: Skin is warm and dry.     Capillary Refill: Capillary refill takes less than 2 seconds.     Findings: Erythema present.  Neurological:     Mental Status: She is alert.  Psychiatric:        Mood and Affect: Mood normal.     ED Results and Treatments Labs (all labs ordered are listed, but only abnormal results are  displayed) Labs Reviewed  COMPREHENSIVE METABOLIC PANEL - Abnormal; Notable for the following components:      Result Value   Chloride 92 (*)    CO2 33 (*)    BUN 38 (*)    All other components within normal limits  CBC WITH DIFFERENTIAL/PLATELET - Abnormal; Notable for the following components:   RBC 3.51 (*)    Hemoglobin 11.5 (*)    HCT 35.6 (*)    MCV 101.4 (*)    RDW 20.2 (*)    nRBC 0.4 (*)    Abs Immature Granulocytes 0.08 (*)    All other components within normal limits                                                                                                                          Radiology VAS [place] LOWER EXTREMITY VENOUS (DVT) (7a-7p)  Result Date: 06/05/2023  Lower Venous DVT Study Patient Name:  AMARIANA CORTAZAR  Date of Exam:   06/05/2023 Medical Rec #: [id number]       Accession #:    [id number] Date of Birth: March 18, 1951       Patient Gender: F Patient Age:   56 years Exam Location:  [facility] Procedure:      VAS [place] LOWER EXTREMITY VENOUS (DVT) Referring Phys: Zyanne Schumm [facility] --------------------------------------------------------------------------------  Indications: Swelling.  Risk Factors: None identified. Limitations: Poor ultrasound/tissue interface and patient pain tolerance. Comparison Study: No prior studies. Performing Technologist: Chanda Busing RVT  Examination Guidelines: A complete evaluation includes B-mode imaging, spectral Doppler, color Doppler, and power Doppler as needed of all accessible portions of each vessel. Bilateral testing is considered an integral part of a complete examination. Limited examinations for reoccurring indications may be performed as noted. The reflux portion of the exam is performed with the patient in reverse Trendelenburg.  +---------+---------------+---------+-----------+----------+--------------+ RIGHT    CompressibilityPhasicitySpontaneityPropertiesThrombus Aging  +---------+---------------+---------+-----------+----------+--------------+ CFV      Full           Yes      Yes                                 +---------+---------------+---------+-----------+----------+--------------+  SFJ      Full                                                        +---------+---------------+---------+-----------+----------+--------------+ FV Prox  Full                                                        +---------+---------------+---------+-----------+----------+--------------+ FV Mid   Full                                                        +---------+---------------+---------+-----------+----------+--------------+ FV DistalFull                                                        +---------+---------------+---------+-----------+----------+--------------+ PFV      Full                                                        +---------+---------------+---------+-----------+----------+--------------+ POP      Full           Yes      Yes                                 +---------+---------------+---------+-----------+----------+--------------+ PTV      Full                                                        +---------+---------------+---------+-----------+----------+--------------+ PERO     Full                                                        +---------+---------------+---------+-----------+----------+--------------+   +----+---------------+---------+-----------+----------+--------------+ LEFTCompressibilityPhasicitySpontaneityPropertiesThrombus Aging +----+---------------+---------+-----------+----------+--------------+ CFV Full           Yes      Yes                                 +----+---------------+---------+-----------+----------+--------------+     Summary: RIGHT: - There is no evidence of deep vein thrombosis in the lower extremity. However, portions of this examination were limited- see  technologist comments above.  - No cystic structure found in the popliteal fossa.  LEFT: - No evidence of common femoral vein obstruction.   *See table(s)  above for measurements and observations. Electronically signed by Coral Else MD on 06/05/2023 at 12:34:33 PM.    Final    DG Tibia/Fibula Right  Result Date: 06/05/2023 CLINICAL DATA:  Cellulitis. EXAM: RIGHT TIBIA AND FIBULA - 2 VIEW COMPARISON:  None Available. FINDINGS: Osteopenia. No fracture or dislocation. No bony erosive changes. Vascular calcifications. No definite soft tissue gas by x-ray. Of note on the frontal view the tip of the medial malleolus is not included in the imaging field. Further workup as clinically appropriate. IMPRESSION: Osteopenia.  No acute osseous abnormality. Electronically Signed   By: Karen Kays M.D.   On: 06/05/2023 11:02    Pertinent labs & imaging results that were available during my care of the patient were reviewed by me and considered in my medical decision making (see MDM for details).  Medications Ordered in ED Medications  cefTRIAXone (ROCEPHIN) 1 g in sodium chloride 0.9 % 100 mL IVPB (has no administration in time range)  enoxaparin (LOVENOX) injection 40 mg (has no administration in time range)  allopurinol (ZYLOPRIM) tablet 300 mg (300 mg Oral Given 06/05/23 1708)  albuterol (PROVENTIL) (2.5 MG/3ML) 0.083% nebulizer solution 2.5 mg (has no administration in time range)  aspirin EC tablet 81 mg (has no administration in time range)  atorvastatin (LIPITOR) tablet 80 mg (has no administration in time range)  calcium carbonate (TUMS - dosed in mg elemental calcium) chewable tablet 200 mg of elemental calcium (has no administration in time range)  oxyCODONE (Oxy IR/ROXICODONE) immediate release tablet 10 mg (10 mg Oral Given 06/05/23 1709)  pantoprazole (PROTONIX) EC tablet 40 mg (has no administration in time range)  verapamil (CALAN-SR) CR tablet 240 mg (240 mg Oral Given 06/05/23 1708)   spironolactone (ALDACTONE) tablet 25 mg (25 mg Oral Given 06/05/23 1709)  lactulose (CHRONULAC) 10 GM/15ML solution 20 g (has no administration in time range)  melatonin tablet 5 mg (has no administration in time range)  nitroGLYCERIN (NITROSTAT) SL tablet 0.4 mg (has no administration in time range)  ezetimibe (ZETIA) tablet 10 mg (has no administration in time range)  clopidogrel (PLAVIX) tablet 75 mg (has no administration in time range)  furosemide (LASIX) tablet 40 mg (40 mg Oral Given 06/05/23 1713)    And  furosemide (LASIX) tablet 40 mg (has no administration in time range)  potassium chloride SA (KLOR-CON M) CR tablet 40 mEq (has no administration in time range)    And  potassium chloride SA (KLOR-CON M) CR tablet 20 mEq (has no administration in time range)  umeclidinium bromide (INCRUSE ELLIPTA) 62.5 MCG/ACT 1 puff (has no administration in time range)  feeding supplement (ENSURE ENLIVE / ENSURE PLUS) liquid 237 mL (has no administration in time range)  influenza vaccine adjuvanted (FLUAD) injection 0.5 mL (has no administration in time range)  cefTRIAXone (ROCEPHIN) 2 g in sodium chloride 0.9 % 100 mL IVPB (0 g Intravenous Stopped 06/05/23 1113)  oxyCODONE-acetaminophen (PERCOCET/ROXICET) 5-325 MG per tablet 2 tablet (2 tablets Oral Given 06/05/23 1036)  fentaNYL (SUBLIMAZE) injection 50 mcg (50 mcg Intravenous Given 06/05/23 1233)  Procedures Procedures  (including critical care time)  Medical Decision Making / ED Course   This patient presents to the ED for concern of leg pain and swelling, this involves an extensive number of treatment options, and is a complaint that carries with it a high risk of complications and morbidity.  The differential diagnosis includes cellulitis, DVT, necrotizing fasciitis, stasis dermatitis, erysipelas,  phlegmasia  MDM: Patient seen emergency room for evaluation of erythema and leg pain.  Physical exam with significant erythema and tenderness at the right leg over the tib-fib.  X-ray imaging without soft tissue gas, DVT ultrasound negative.  Laboratory evaluation with a hemoglobin of 11.5 with an MCV of 101.4, chloride 92, CO2 33.  Patient is having difficulty caring for herself at home with decreased mobility secondary to this and likely would not be able to pick up oral antibiotics or take these at home.  She also has radiation scheduled for tomorrow.  Patient will require hospital admission for cellulitis and patient started on ceftriaxone.  Patient admitted.   Additional history obtained: -Additional history obtained from son -External records from outside source obtained and reviewed including: Chart review including previous notes, labs, imaging, consultation notes   Lab Tests: -I ordered, reviewed, and interpreted labs.   The pertinent results include:   Labs Reviewed  COMPREHENSIVE METABOLIC PANEL - Abnormal; Notable for the following components:      Result Value   Chloride 92 (*)    CO2 33 (*)    BUN 38 (*)    All other components within normal limits  CBC WITH DIFFERENTIAL/PLATELET - Abnormal; Notable for the following components:   RBC 3.51 (*)    Hemoglobin 11.5 (*)    HCT 35.6 (*)    MCV 101.4 (*)    RDW 20.2 (*)    nRBC 0.4 (*)    Abs Immature Granulocytes 0.08 (*)    All other components within normal limits       Imaging Studies ordered: I ordered imaging studies including tib-fib XR, DVT ultrasound I independently visualized and interpreted imaging. I agree with the radiologist interpretation   Medicines ordered and prescription drug management: Meds ordered this encounter  Medications   cefTRIAXone (ROCEPHIN) 2 g in sodium chloride 0.9 % 100 mL IVPB    Order Specific Question:   Antibiotic Indication:    Answer:   Cellulitis   oxyCODONE-acetaminophen  (PERCOCET/ROXICET) 5-325 MG per tablet 2 tablet   fentaNYL (SUBLIMAZE) injection 50 mcg   cefTRIAXone (ROCEPHIN) 1 g in sodium chloride 0.9 % 100 mL IVPB    Order Specific Question:   Antibiotic Indication:    Answer:   Cellulitis   enoxaparin (LOVENOX) injection 40 mg   allopurinol (ZYLOPRIM) tablet 300 mg   albuterol (PROVENTIL) (2.5 MG/3ML) 0.083% nebulizer solution 2.5 mg   aspirin EC tablet 81 mg   atorvastatin (LIPITOR) tablet 80 mg   calcium carbonate (TUMS - dosed in mg elemental calcium) chewable tablet 200 mg of elemental calcium   oxyCODONE (Oxy IR/ROXICODONE) immediate release tablet 10 mg   pantoprazole (PROTONIX) EC tablet 40 mg   DISCONTD: potassium chloride SA (KLOR-CON M) CR tablet 20 mEq    Take one tablet by mouth twice daily on Mondays, Wednesdays, and Fridays, then take one tablet by mouth daily on other days.     verapamil (CALAN-SR) CR tablet 240 mg   spironolactone (ALDACTONE) tablet 25 mg   DISCONTD: Tiotropium Bromide Monohydrate AERS 2 puff   lactulose (CHRONULAC) 10 GM/15ML  solution 20 g   melatonin tablet 5 mg   nitroGLYCERIN (NITROSTAT) SL tablet 0.4 mg   DISCONTD: furosemide (LASIX) tablet 40 mg    Take one tablet by mouth twice daily on Mondays, Wednesdays, and Fridays, then take one tablet by mouth daily on other days.     ezetimibe (ZETIA) tablet 10 mg   clopidogrel (PLAVIX) tablet 75 mg   AND Linked Order Group    furosemide (LASIX) tablet 40 mg     Take one tablet by mouth twice daily on Mondays, Wednesdays, and Fridays, then take one tablet by mouth daily on other days.    furosemide (LASIX) tablet 40 mg     Take one tablet by mouth twice daily on Mondays, Wednesdays, and Fridays, then take one tablet by mouth daily on other days.   AND Linked Order Group    potassium chloride SA (KLOR-CON M) CR tablet 40 mEq     Take 40 mEq on Mondays, Wednesdays, and Fridays, then take daily on other days.    potassium chloride SA (KLOR-CON M) CR tablet 20  mEq     Take 40 mEq on Mondays, Wednesdays, and Fridays, then take daily on other days.   umeclidinium bromide (INCRUSE ELLIPTA) 62.5 MCG/ACT 1 puff   feeding supplement (ENSURE ENLIVE / ENSURE PLUS) liquid 237 mL   influenza vaccine adjuvanted (FLUAD) injection 0.5 mL    -I have reviewed the patients home medicines and have made adjustments as needed  Critical interventions none   Cardiac Monitoring: The patient was maintained on a cardiac monitor.  I personally viewed and interpreted the cardiac monitored which showed an underlying rhythm of: NSR  Social Determinants of Health:  Factors impacting patients care include: Difficulty caring for self at home   Reevaluation: After the interventions noted above, I reevaluated the patient and found that they have :improved  Co morbidities that complicate the patient evaluation  Past Medical History:  Diagnosis Date   AAA (abdominal aortic aneurysm) (HCC)    3.4 cm   Anemia    Anxiety    Aortic atherosclerosis (HCC)    Arthritis    Asthma    CAD (coronary artery disease)    a. Nonobstructive CAD by cath 07/17/13.   Cancer [facility])    Carotid artery disease (HCC)    Carotid US 8/22: Bilateral ICA 1-39; right subclavian stenosis; right thyroid nodule   CHD (congenital heart disease)    pt unaware???   Chest pain    a. Adm 10-07/2013 - CTA neg for PE/dissection, cath with nonobstructive disease, CP ?due to uncontrolled HTN vs coronary vasospasm.   CHF (congestive heart failure) (HCC)    Closed head injury with concussion    COPD (chronic obstructive pulmonary disease) (HCC)    Coronary artery disease    Echocardiogram    Echocardiogram 06/2019: EF 60-65, impaired relaxation (Gr 1 DD), mild MAC, mild MR, mild to mod aortic valve sclerosis (no AS), trivial TR, mild LAE, normal RVSF, RVSP 30.7 (mildly elevated)   GERD (gastroesophageal reflux disease)    History of blood transfusion    childbirth, Hysterectomy    Hyperlipidemia    Hypertension    Neuropathy    OA (osteoarthritis)    Osteoporosis    PAD (peripheral artery disease) (HCC)    a. s/p L fem-pop bypass b. recent evaluation at [facility], unamenable to intervention   PAD (peripheral artery disease) (HCC)    Palpitations 02/22/2016   Pneumonia  Pre-diabetes    Premature ventricular contraction    PUD (peptic ulcer disease)    PVD (peripheral vascular disease) (HCC) 07/17/2013   Thoracic aortic aneurysm (HCC)    Chest/aorta CTA 04/2022: 14 x 8 focal saccular aneurysm versus penetrating atherosclerotic ulcer involving left side of proximal portion of transverse aortic arch; descending thoracic aorta and proximal abdominal aorta aneurysm 4.4 cm; aortic atherosclerosis; emphysema   Tobacco abuse    Wears dentures    Wears glasses       Dispostion: I considered admission for this patient, and patient require hospital admission for  Cellulitis     Final Clinical Impression(s) / ED Diagnoses Final diagnoses:  Cellulitis, unspecified cellulitis site     @PCDICTATION @    Glendora Score, MD 06/05/23 2011

## 2023-06-05 NOTE — ED Triage Notes (Signed)
Pt presents after Radiation with ? Cellulitis to rt lower leg from a bump against door frame a few days ago, red and swollen with direct injury to posterior leg.

## 2023-06-05 NOTE — Progress Notes (Signed)
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 Patient has completed 11 of 35 planned fractions.   Met with pt following treatment. Son is present for visit today. Pt reports sore throat. She has needed to utilize lidocaine rinse. Pt reports she is drinking 4 Ensure Plus. These are filling. She tries to eat at least once. Recalls ham sandwich with extra mayo for dinner. Pt has been taking lasix as prescribed for LE edema. RD observed redness to LLE at visit. Pt reports she bumped it a few days ago. Scabbing noted to dorsal LE. Son reports LE redness/swelling was not present yesterday. Contacted [facility] who unfortunately is unable to see pt. Requested RN Victorino Dike Malmfelt) to assess wound. She agrees pt should present to the ED for further evaluation. She is agreeable. Administrative assistant escorted pt and son to ED.    Medications: reviewed   Labs: processing at this time  Anthropometrics: Wt 133.2 lb on 9/16 (some wt loss r/t fluid - on lasix)  9/9 - 137.4 lb  9/5 - 133.8 lb    NUTRITION DIAGNOSIS: Food and nutrition related knowledge deficit continues    INTERVENTION:  Continue drinking 4 Ensure Plus/equivalent Encouraged oral intake as tolerated  Continue baking soda salt water rinses     MONITORING, EVALUATION, GOAL: wt trends, intake   NEXT VISIT: Wednesday September 25 after treatment

## 2023-06-05 NOTE — H&P (Signed)
History and Physical    Patient: Marie Johnson XLK:[id number] DOB: [birth year] DOA: 06/05/2023 DOS: the patient was seen and examined on 06/05/2023 PCP: Ailene Ravel, MD  Patient coming from: Home  Chief Complaint:  Chief Complaint  Patient presents with   Leg Injury   HPI: Marie Johnson is a 72 y.o. female with medical history significant of AAA, anemia, anxiety, aortic atherosclerosis, osteoarthritis, asthma, nonobstructive CAD, chronic diastolic CHF, carotid artery disease, congenital heart disease, COPD, GERD, hyperlipidemia, hypertension, osteoarthritis, osteoporosis, PAD, history of bilateral femoropopliteal bypass, CABG, prediabetes, PVC, PUD, peripheral vascular disease, tobacco abuse, laryngeal cancer who presented to the emergency department complaints of having progressively worse edema, erythema, calor and TTP over her right lower extremity after she "bumped" her leg at home several days ago.  No fever, chills and questionable night sweats. No sore throat, rhinorrhea, dyspnea, wheezing or hemoptysis.  No chest pain, palpitations, diaphoresis, PND, orthopnea or pitting edema of the lower extremities.  No abdominal pain, diarrhea, constipation, melena or hematochezia.  No flank pain, dysuria, frequency or hematuria.  No polyuria, polydipsia, polyphagia or blurred vision.  Lab work: CBC showed a white count 5.2, hemoglobin 11.5 g/dL with an MCV of 366.4 fL and platelets 211.  CMP showed a chloride of 92 and CO2 of 33 mmol/L with a normal anion gap.  BUN was 38 mg/dL, the rest of the CMP measurements were normal.  Imaging: Right tibia and fibula x-ray with osteopenia.  There is no shows acute abnormality.  ED course: Initial vital signs were temperature 98.2 F, pulse 71, respirations 16, BP 146/91 mmHg O2 sat 100% on room air.  The patient received ceftriaxone 2 g IVPB, fentanyl 50 mcg IVP, oxycodone/acetaminophen 5/325 mg 2 tablets p.o. x 1.   Review of Systems: As mentioned in the  history of present illness. All other systems reviewed and are negative. Past Medical History:  Diagnosis Date   AAA (abdominal aortic aneurysm) (HCC)    3.4 cm   Anemia    Anxiety    Aortic atherosclerosis (HCC)    Arthritis    Asthma    CAD (coronary artery disease)    a. Nonobstructive CAD by cath 07/17/13.   Cancer [facility])    Carotid artery disease (HCC)    Carotid US 8/22: Bilateral ICA 1-39; right subclavian stenosis; right thyroid nodule   CHD (congenital heart disease)    pt unaware???   Chest pain    a. Adm 10-07/2013 - CTA neg for PE/dissection, cath with nonobstructive disease, CP ?due to uncontrolled HTN vs coronary vasospasm.   CHF (congestive heart failure) (HCC)    Closed head injury with concussion    COPD (chronic obstructive pulmonary disease) (HCC)    Coronary artery disease    Echocardiogram    Echocardiogram 06/2019: EF 60-65, impaired relaxation (Gr 1 DD), mild MAC, mild MR, mild to mod aortic valve sclerosis (no AS), trivial TR, mild LAE, normal RVSF, RVSP 30.7 (mildly elevated)   GERD (gastroesophageal reflux disease)    History of blood transfusion    childbirth, Hysterectomy   Hyperlipidemia    Hypertension    Neuropathy    OA (osteoarthritis)    Osteoporosis    PAD (peripheral artery disease) (HCC)    a. s/p L fem-pop bypass b. recent evaluation at [facility], unamenable to intervention   PAD (peripheral artery disease) (HCC)    Palpitations 02/22/2016   Pneumonia    Pre-diabetes    Premature ventricular contraction  PUD (peptic ulcer disease)    PVD (peripheral vascular disease) (HCC) 07/17/2013   Thoracic aortic aneurysm (HCC)    Chest/aorta CTA 04/2022: 14 x 8 focal saccular aneurysm versus penetrating atherosclerotic ulcer involving left side of proximal portion of transverse aortic arch; descending thoracic aorta and proximal abdominal aorta aneurysm 4.4 cm; aortic atherosclerosis; emphysema   Tobacco abuse    Wears dentures    Wears glasses     Past Surgical History:  Procedure Laterality Date   ABDOMINAL AORTOGRAM W/LOWER EXTREMITY N/A 03/09/2020   Procedure: ABDOMINAL AORTOGRAM W/LOWER EXTREMITY;  Surgeon: Cephus Shelling, MD;  Location: [facility] INVASIVE CV LAB;  Service: Cardiovascular;  Laterality: N/A;   ABDOMINAL AORTOGRAM W/LOWER EXTREMITY N/A 07/26/2022   Procedure: ABDOMINAL AORTOGRAM W/LOWER EXTREMITY;  Surgeon: Cephus Shelling, MD;  Location: [facility] INVASIVE CV LAB;  Service: Cardiovascular;  Laterality: N/A;   ABDOMINAL AORTOGRAM W/LOWER EXTREMITY N/A 09/20/2022   Procedure: ABDOMINAL AORTOGRAM W/LOWER EXTREMITY;  Surgeon: Cephus Shelling, MD;  Location: [facility] INVASIVE CV LAB;  Service: Cardiovascular;  Laterality: N/A;   ABDOMINAL HYSTERECTOMY     BREAST SURGERY     left cyst excision x 2   CARDIAC CATHETERIZATION     several years ago, nonobstructive, "50-60% blockages"   DIRECT LARYNGOSCOPY Bilateral 03/13/2023   Procedure: MICRO DIRECT LARYNGOSCOPY WITH BIOPSY;  Surgeon: Christia Reading, MD;  Location: [facility] OR;  Service: ENT;  Laterality: Bilateral;   ENDARTERECTOMY FEMORAL Right 03/18/2020   Procedure: ENDARTERECTOMY FEMORAL WITH PROFUNDAPLASTY;  Surgeon: Cephus Shelling, MD;  Location: [facility] OR;  Service: Vascular;  Laterality: Right;   FEMORAL-POPLITEAL BYPASS GRAFT Right 03/18/2020   Procedure: BYPASS GRAFT FEMORAL-POPLITEAL ARTERY using GORE PROPATEN VASCULAR GRAFT;  Surgeon: Cephus Shelling, MD;  Location: [facility] OR;  Service: Vascular;  Laterality: Right;   HIP SURGERY     left - bars and screws placed   HIP SURGERY     INSERTION OF ILIAC STENT Right 03/18/2020   Procedure: INSERTION OF GORE VIABAHN  ILIAC STENT;  Surgeon: Cephus Shelling, MD;  Location: [facility] OR;  Service: Vascular;  Laterality: Right;   LEFT HEART CATH AND CORONARY ANGIOGRAPHY N/A 04/08/2023   Procedure: LEFT HEART CATH AND CORONARY ANGIOGRAPHY;  Surgeon: Marykay Lex, MD;  Location: [facility] INVASIVE CV LAB;  Service: Cardiovascular;   Laterality: N/A;   LEFT HEART CATHETERIZATION WITH CORONARY ANGIOGRAM N/A 07/17/2013   Procedure: LEFT HEART CATHETERIZATION WITH CORONARY ANGIOGRAM;  Surgeon: Laurey Morale, MD;  Location: [facility] CATH LAB;  Service: Cardiovascular;  Laterality: N/A;   LOWER EXTREMITY ANGIOGRAM Right 03/18/2020   Procedure: RIGHT ILIAC ARTERIOGRAM WITH AN ILIAC STENT;  Surgeon: Cephus Shelling, MD;  Location: [facility] OR;  Service: Vascular;  Laterality: Right;   MULTIPLE TOOTH EXTRACTIONS     PATCH ANGIOPLASTY Right 03/18/2020   Procedure: PATCH ANGIOPLASTY using a Livia Snellen BIOLOGIC PATCH OF THE PROFUNDA;  Surgeon: Cephus Shelling, MD;  Location: [facility] OR;  Service: Vascular;  Laterality: Right;   PERIPHERAL VASCULAR CATHETERIZATION N/A 07/16/2016   Procedure: Abdominal Aortogram w/Lower Extremity;  Surgeon: Larina Earthly, MD;  Location: [facility] INVASIVE CV LAB;  Service: Cardiovascular;  Laterality: N/A;   PERIPHERAL VASCULAR INTERVENTION Left 03/09/2020   Procedure: PERIPHERAL VASCULAR INTERVENTION;  Surgeon: Cephus Shelling, MD;  Location: [facility] INVASIVE CV LAB;  Service: Cardiovascular;  Laterality: Left;  common and external illiac    ROTATOR CUFF REPAIR     right side, had torn ligaments as well   TUBAL LIGATION  VEIN BYPASS SURGERY     left leg   WRIST ARTHROCENTESIS     left - broke in 3 places so has bars and screws placed   Social History:  reports that she has quit smoking. Her smoking use included cigarettes. She has a 28.5 pack-year smoking history. She has been exposed to tobacco smoke. She has never used smokeless tobacco. She reports that she does not drink alcohol and does not use drugs.  Allergies  Allergen Reactions   Bee Venom Anaphylaxis and Other (See Comments)    Welts, also    Insect bites- Welts, also   Insect Extract Anaphylaxis and Other (See Comments)    Insect bites- Welts, also   Farxiga [Dapagliflozin] Nausea And Vomiting   Lyrica [Pregabalin] Nausea And Vomiting   Neurontin  [Gabapentin] Nausea And Vomiting and Other (See Comments)    Hypertension/dizziness   Nsaids Nausea Only and Other (See Comments)    Patient is not suppose to have this class of medication (liver)  Can tolerate 81mg , which she is taking   Latex Rash and Other (See Comments)    Family History  Problem Relation Age of Onset   Cervical cancer Mother    Heart disease Mother        MI in 80s   Liver cancer Father    Heart disease Father    Lung cancer Maternal Aunt    Prostate cancer Maternal Uncle        met. anal   Breast cancer Maternal Grandmother    Lung cancer Maternal Aunt    Lung cancer Maternal Aunt    Stomach cancer Maternal Aunt    Colon cancer Maternal Aunt    Lung cancer Maternal Uncle     Prior to Admission medications   Medication Sig Start Date End Date Taking? Authorizing Provider  albuterol (PROVENTIL HFA;VENTOLIN HFA) 108 (90 BASE) MCG/ACT inhaler Inhale 2 puffs into the lungs every 6 (six) hours as needed for wheezing or shortness of breath.    [provider]  albuterol (PROVENTIL) (2.5 MG/3ML) 0.083% nebulizer solution Take 2.5 mg by nebulization every 6 (six) hours as needed for wheezing or shortness of breath.    [provider]  alendronate (FOSAMAX) 70 MG tablet Take 70 mg by mouth every Wednesday. 02/03/16   [provider]  allopurinol (ZYLOPRIM) 300 MG tablet Take 300 mg by mouth every evening. 08/02/17   [provider]  aspirin EC 81 MG tablet Take 81 mg by mouth in the morning.    [provider]  atorvastatin (LIPITOR) 80 MG tablet Take 80 mg by mouth at bedtime. 02/04/20   [provider]  calcium carbonate (ANTACID) 500 MG chewable tablet Chew 1 tablet by mouth 2 (two) times daily as needed for heartburn or indigestion.    [provider]  Cholecalciferol (VITAMIN D) 125 MCG (5000 UT) CAPS Take 5,000 Units by mouth in the morning.    [provider]  clopidogrel (PLAVIX) 75 MG  tablet Take 1 tablet (75 mg total) by mouth daily. Patient taking differently: Take 75 mg by mouth at bedtime. 07/27/22 07/27/23  Lars Mage, PA-C  cyanocobalamin (VITAMIN B12) 1000 MCG tablet Take 1,000 mcg by mouth daily.    [provider]  EPIPEN 2-PAK 0.3 MG/0.3ML SOAJ injection Inject 0.3 mg into the muscle daily as needed for anaphylaxis. Use as directed as needed for allergies. 12/14/15   [provider]  ezetimibe (ZETIA) 10 MG tablet Take 1  tablet (10 mg total) by mouth daily. Patient taking differently: Take 10 mg by mouth at bedtime. 03/29/23   Meriam Sprague, MD  furosemide (LASIX) 40 MG tablet Take one tablet by mouth twice daily on Mondays, Wednesdays, and Fridays, then take one tablet by mouth daily on other days. 05/23/23   Nahser, Deloris Ping, MD  lactulose (CHRONULAC) 10 GM/15ML solution Take 20 g by mouth 2 (two) times daily as needed for moderate constipation. 10/29/19   [provider]  lidocaine (XYLOCAINE) 2 % solution Patient: Mix 1part 2% viscous lidocaine, 1part H20. Swallow 10mL of diluted mixture, before meals and at bedtime, up to QID 05/28/23   Lonie Peak, MD  melatonin 5 MG TABS Take 5 mg by mouth at bedtime.    [provider]  Menthol-Methyl Salicylate (MUSCLE RUB) 10-15 % CREA Apply 1 application topically daily as needed (for arthritis pain).    [provider]  nitroGLYCERIN (NITROSTAT) 0.4 MG SL tablet Place 1 tablet (0.4 mg total) under the tongue every 5 (five) minutes x 3 doses as needed for chest pain. 04/26/16   Gypsy Balsam K, NP  Oxycodone HCl 10 MG TABS Take 1 tablet (10 mg total) by mouth 4 (four) times daily as needed (for pain). 03/22/20   Emilie Rutter, PA-C  pantoprazole (PROTONIX) 40 MG tablet Take 40 mg by mouth 2 (two) times daily.    [provider]  potassium chloride SA (KLOR-CON M20) 20 MEQ tablet Take one tablet by mouth twice daily on Mondays, Wednesdays, and Fridays, then take  one tablet by mouth daily on other days. 05/23/23   Nahser, Deloris Ping, MD  senna-docusate (SENOKOT-S) 8.6-50 MG tablet Take 2 tablets by mouth at bedtime.    [provider]  SPIRIVA RESPIMAT 2.5 MCG/ACT AERS Take 2 puffs by mouth daily. 04/03/23   [provider]  spironolactone (ALDACTONE) 25 MG tablet Take 1 tablet (25 mg total) by mouth daily. 09/03/17   Lars Masson, MD  verapamil (CALAN-SR) 240 MG CR tablet Take 1 tablet (240 mg total) by mouth daily. 03/29/23   Meriam Sprague, MD    Physical Exam: Vitals:   06/05/23 0923 06/05/23 0933  BP: (!) 146/91   Pulse: (!) 101   Resp: 16   Temp: 98.2 F (36.8 C)   TempSrc: Oral   SpO2: 100%   Weight:  59 kg  Height:  5' 7.5" (1.715 m)   Physical Exam Vitals and nursing note reviewed.  Constitutional:      General: She is awake. She is not in acute distress.    Appearance: Normal appearance.  HENT:     Head: Normocephalic.     Nose: No rhinorrhea.     Mouth/Throat:     Mouth: Mucous membranes are moist.  Eyes:     General: No scleral icterus.    Pupils: Pupils are equal, round, and reactive to light.  Neck:     Vascular: No JVD.  Cardiovascular:     Rate and Rhythm: Normal rate and regular rhythm.     Heart sounds: S1 normal and S2 normal.  Pulmonary:     Effort: Pulmonary effort is normal.     Breath sounds: Normal breath sounds. No wheezing, rhonchi or rales.  Abdominal:     General: Bowel sounds are normal.     Palpations: Abdomen is soft.  Musculoskeletal:     Cervical back: Neck supple.     Right lower leg: No edema.  Left lower leg: No edema.  Skin:    General: Skin is warm and dry.  Neurological:     General: No focal deficit present.     Mental Status: She is alert and oriented to person, place, and time.  Psychiatric:        Mood and Affect: Mood normal.        Behavior: Behavior normal. Behavior is cooperative.          Data Reviewed:  Results are pending, will review  when available.  Assessment and Plan: Principal Problem:   Cellulitis of right lower extremity Admit to MedSurg/inpatient. Continue ceftriaxone 1 g IVPB daily. Analgesics as needed. Follow-up CBC and chemistry in the morning.  Active Problems:   Coronary artery disease involving native coronary artery of native heart Continue on atorvastatin, ezetimibe, DAPT and verapamil. No beta-blocker due to COPD. Follow-up with cardiology as an outpatient.    COPD (chronic obstructive pulmonary disease) (HCC) Continue Spiriva 2 puffs daily. Continue albuterol as needed.    Hypertension Continue verapamil SR 240 mg p.o. daily. Continue spironolactone 25 mg p.o. daily. Continue furosemide per home instructions.    Chronic diastolic CHF (congestive heart failure) (HCC) No signs of decompensation. Continue diuretics as above.    PAD (peripheral artery disease) (HCC) Continue antihyperlipidemics and DAPT as above. Follow-up with vascular surgery as an outpatient.    Tobacco abuse In remission.    Aortic atherosclerosis (HCC)   Hyperlipidemia Continue atorvastatin 80 mg p.o. daily. Continue ezetimibe 10 mg p.o. daily.    GERD (gastroesophageal reflux disease) Continue pantoprazole 40 mg p.o. twice daily. Continue calcium carbonate as needed.    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 Given active infection will likely need to be rescheduled.    Advance Care Planning:   Code Status: Full Code   Consults:   Family Communication:   Severity of Illness: The appropriate patient status for this patient is INPATIENT. Inpatient status is judged to be reasonable and necessary in order to provide the required intensity of service to ensure the patient's safety. The patient's presenting symptoms, physical exam findings, and initial radiographic and laboratory data in the context of their chronic comorbidities is felt to place  them at high risk for further clinical deterioration. Furthermore, it is not anticipated that the patient will be medically stable for discharge from the hospital within 2 midnights of admission.   * I certify that at the point of admission it is my clinical judgment that the patient will require inpatient hospital care spanning beyond 2 midnights from the point of admission due to high intensity of service, high risk for further deterioration and high frequency of surveillance required.*  Author: Bobette Mo, MD 06/05/2023 11:37 AM  For on call review [email or web address].   This document was prepared using Dragon voice recognition software and may contain some unintended transcription errors.

## 2023-06-05 NOTE — Progress Notes (Signed)
Right lower extremity venous duplex has been completed. Preliminary results can be found in CV Proc through chart review.  Results were given to Dr. Posey Rea.  06/05/23 10:37 AM Olen Cordial RVT

## 2023-06-05 NOTE — Plan of Care (Signed)
Problem: Activity: Goal: Ability to tolerate increased activity will improve Outcome: Progressing   Problem: Cardiac: Goal: Ability to achieve and maintain adequate cardiovascular perfusion will improve Outcome: Progressing

## 2023-06-05 NOTE — ED Notes (Signed)
ED TO INPATIENT HANDOFF REPORT  ED Nurse Name and Phone #: Dorene Sorrow Name/Age/Gender Marie Johnson 72 y.o. female Room/Bed: WA19/WA19  Code Status   Code Status: Full Code  Home/SNF/Other Home Patient oriented to: self, place, time, and situation Is this baseline? Yes   Triage Complete: Triage complete  Chief Complaint Cellulitis of right lower extremity [L03.115]  Triage Note Pt presents after Radiation with ? Cellulitis to rt lower leg from a bump against door frame a few days ago, red and swollen with direct injury to posterior leg.   Allergies Allergies  Allergen Reactions   Bee Venom Anaphylaxis and Other (See Comments)    Welts, also    Insect bites- Welts, also   Insect Extract Anaphylaxis and Other (See Comments)    Insect bites- Welts, also   Farxiga [Dapagliflozin] Nausea And Vomiting   Lyrica [Pregabalin] Nausea And Vomiting   Neurontin [Gabapentin] Nausea And Vomiting and Other (See Comments)    Hypertension/dizziness   Nsaids Nausea Only and Other (See Comments)    Patient is not suppose to have this class of medication (liver)  Can tolerate 81mg , which she is taking   Latex Rash and Other (See Comments)    Level of Care/Admitting Diagnosis ED Disposition     ED Disposition  Admit   Condition  --   Comment  Hospital Area: [facility] [[id number]]  Level of Care: Med-Surg [16]  May admit patient to [facility] or [name] if equivalent level of care is available:: No  Covid Evaluation: Asymptomatic - no recent exposure (last 10 days) testing not required  Diagnosis: Cellulitis of right lower extremity [811914]  Admitting Physician: Bobette Mo [[id number]]  Attending Physician: Bobette Mo [[id number]]  Certification:: I certify this patient will need inpatient services for at least 2 midnights  Expected Medical Readiness: 06/07/2023          B Medical/Surgery History Past Medical History:  Diagnosis Date    AAA (abdominal aortic aneurysm) (HCC)    3.4 cm   Anemia    Anxiety    Aortic atherosclerosis (HCC)    Arthritis    Asthma    CAD (coronary artery disease)    a. Nonobstructive CAD by cath 07/17/13.   Cancer [facility])    Carotid artery disease (HCC)    Carotid US 8/22: Bilateral ICA 1-39; right subclavian stenosis; right thyroid nodule   CHD (congenital heart disease)    pt unaware???   Chest pain    a. Adm 10-07/2013 - CTA neg for PE/dissection, cath with nonobstructive disease, CP ?due to uncontrolled HTN vs coronary vasospasm.   CHF (congestive heart failure) (HCC)    Closed head injury with concussion    COPD (chronic obstructive pulmonary disease) (HCC)    Coronary artery disease    Echocardiogram    Echocardiogram 06/2019: EF 60-65, impaired relaxation (Gr 1 DD), mild MAC, mild MR, mild to mod aortic valve sclerosis (no AS), trivial TR, mild LAE, normal RVSF, RVSP 30.7 (mildly elevated)   GERD (gastroesophageal reflux disease)    History of blood transfusion    childbirth, Hysterectomy   Hyperlipidemia    Hypertension    Neuropathy    OA (osteoarthritis)    Osteoporosis    PAD (peripheral artery disease) (HCC)    a. s/p L fem-pop bypass b. recent evaluation at [facility], unamenable to intervention   PAD (peripheral artery disease) (HCC)    Palpitations 02/22/2016   Pneumonia  Pre-diabetes    Premature ventricular contraction    PUD (peptic ulcer disease)    PVD (peripheral vascular disease) (HCC) 07/17/2013   Thoracic aortic aneurysm (HCC)    Chest/aorta CTA 04/2022: 14 x 8 focal saccular aneurysm versus penetrating atherosclerotic ulcer involving left side of proximal portion of transverse aortic arch; descending thoracic aorta and proximal abdominal aorta aneurysm 4.4 cm; aortic atherosclerosis; emphysema   Tobacco abuse    Wears dentures    Wears glasses    Past Surgical History:  Procedure Laterality Date   ABDOMINAL AORTOGRAM W/LOWER EXTREMITY N/A 03/09/2020    Procedure: ABDOMINAL AORTOGRAM W/LOWER EXTREMITY;  Surgeon: Cephus Shelling, MD;  Location: [facility] INVASIVE CV LAB;  Service: Cardiovascular;  Laterality: N/A;   ABDOMINAL AORTOGRAM W/LOWER EXTREMITY N/A 07/26/2022   Procedure: ABDOMINAL AORTOGRAM W/LOWER EXTREMITY;  Surgeon: Cephus Shelling, MD;  Location: [facility]VAS[facility] CV LAB;  Service: Cardiovascular;  Laterality: N/A;   ABDOMINAL AORTOGRAM W/LOWER EXTREMITY N/A 09/20/2022   Procedure: ABDOMINAL AORTOGRAM W/LOWER EXTREMITY;  Surgeon: Cephus Shelling, MD;  Location: [facility]VAS[facility] CV LAB;  Service: Cardiovascular;  Laterality: N/A;   ABDOMINAL HYSTERECTOMY     BREAST SURGERY     left cyst excision x 2   CARDIAC CATHETERIZATION     several years ago, nonobstructive, "50-60% blockages"   DIRECT LARYNGOSCOPY Bilateral 03/13/2023   Procedure: MICRO DIRECT LARYNGOSCOPY WITH BIOPSY;  Surgeon: Christia Reading, MD;  Location: [facility] OR;  Service: ENT;  Laterality: Bilateral;   ENDARTERECTOMY FEMORAL Right 03/18/2020   Procedure: ENDARTERECTOMY FEMORAL WITH PROFUNDAPLASTY;  Surgeon: Cephus Shelling, MD;  Location[facility] OR;  Service: Vascular;  Laterality: Right;   FEMORAL-POPLITEAL BYPASS GRAFT Right 03/18/2020   Procedure: BYPASS GRAFT FEMORAL-POPLITEAL ARTERY using GORE PROPATEN VASCULAR GRAFT;  Surgeon: Cephus Shelling, MD;  Location: [facility] OR;  Service: Vascular;  Laterality: Right;   HIP SURGERY     left - bars and screws placed   HIP SURGERY     INSERTION OF ILIAC STENT Right 03/18/2020   Procedure: INSERTION OF GORE VIABAHN  ILIAC STENT;  Surgeon: Cephus Shelling, MD;  Location: [facility] OR;  Service: Vascular;  Laterality: Right;   LEFT HEART CATH AND CORONARY ANGIOGRAPHY N/A 04/08/2023   Procedure: LEFT HEART CATH AND CORONARY ANGIOGRAPHY;  Surgeon: Marykay Lex, MD;  Location: [facility] INVASIVE CV LAB;  Service: Cardiovascular;  Laterality: N/A;   LEFT HEART CATHETERIZATION WITH CORONARY ANGIOGRAM N/A 07/17/2013   Procedure: LEFT HEART  CATHETERIZATION WITH CORONARY ANGIOGRAM;  Surgeon: Laurey Morale, MD;  Location: [facility] CATH LAB;  Service: Cardiovascular;  Laterality: N/A;   LOWER EXTREMITY ANGIOGRAM Right 03/18/2020   Procedure: RIGHT ILIAC ARTERIOGRAM WITH AN ILIAC STENT;  Surgeon: Cephus Shelling, MD;  Location: [facility] OR;  Service: Vascular;  Laterality: Right;   MULTIPLE TOOTH EXTRACTIONS     PATCH ANGIOPLASTY Right 03/18/2020   Procedure: PATCH ANGIOPLASTY using a Livia Snellen BIOLOGIC PATCH OF THE PROFUNDA;  Surgeon: Cephus Shelling, MD;  Location: [facility] OR;  Service: Vascular;  Laterality: Right;   PERIPHERAL VASCULAR CATHETERIZATION N/A 07/16/2016   Procedure: Abdominal Aortogram w/Lower Extremity;  Surgeon: Larina Earthly, MD;  Location: [facility] INVASIVE CV LAB;  Service: Cardiovascular;  Laterality: N/A;   PERIPHERAL VASCULAR INTERVENTION Left 03/09/2020   Procedure: PERIPHERAL VASCULAR INTERVENTION;  Surgeon: Cephus Shelling, MD;  Location: [facility];  Service: Cardiovascular;  Laterality: Left;  common and external illiac    ROTATOR CUFF REPAIR     right  side, had torn ligaments as well   TUBAL LIGATION     VEIN BYPASS SURGERY     left leg   WRIST ARTHROCENTESIS     left - broke in 3 places so has bars and screws placed     A IV Location/Drains/Wounds Patient Lines/Drains/Airways Status     Active Line/Drains/Airways     Name Placement date Placement time Site Days   Peripheral IV 06/05/23 20 G Left Antecubital 06/05/23  1040  Antecubital  less than 1            Intake/Output Last 24 hours No intake or output data in the 24 hours ending 06/05/23 1350  Labs/Imaging Results for orders placed or performed during the hospital encounter of 06/05/23 (from the past 48 hour(s))  Comprehensive metabolic panel     Status: Abnormal   Collection Time: 06/05/23 10:06 AM  Result Value Ref Range   Sodium 135 135 - 145 mmol/L   Potassium 4.3 3.5 - 5.1 mmol/L   Chloride 92 (L) 98 - 111 mmol/L   CO2 33 (H)  22 - 32 mmol/L   Glucose, Bld 93 70 - 99 mg/dL    Comment: Glucose reference range applies only to samples taken after fasting for at least 8 hours.   BUN 38 (H) 8 - 23 mg/dL   Creatinine, Ser 1.61 0.44 - 1.00 mg/dL   Calcium 9.9 8.9 - 09.6 mg/dL   Total Protein 7.4 6.5 - 8.1 g/dL   Albumin 3.8 3.5 - 5.0 g/dL   AST 21 15 - 41 U/L   ALT 21 0 - 44 U/L   Alkaline Phosphatase 54 38 - 126 U/L   Total Bilirubin 0.8 0.3 - 1.2 mg/dL   GFR, Estimated >04 >54 mL/min    Comment: (NOTE) Calculated using the CKD-EPI Creatinine Equation (2021)    Anion gap 10 5 - 15    Comment: Performed at [facility], [address], [place], [place] [address]  CBC with Differential     Status: Abnormal   Collection Time: 06/05/23 10:06 AM  Result Value Ref Range   WBC 5.2 4.0 - 10.5 K/uL   RBC 3.51 (L) 3.87 - 5.11 MIL/uL   Hemoglobin 11.5 (L) 12.0 - 15.0 g/dL   HCT 91.4 (L) 78.2 - 95.6 %   MCV 101.4 (H) 80.0 - 100.0 fL   MCH 32.8 26.0 - 34.0 pg   MCHC 32.3 30.0 - 36.0 g/dL   RDW 21.3 (H) 08.6 - 57.8 %   Platelets 211 150 - 400 K/uL   nRBC 0.4 (H) 0.0 - 0.2 %   Neutrophils Relative % 65 %   Neutro Abs 3.4 1.7 - 7.7 K/uL   Lymphocytes Relative 14 %   Lymphs Abs 0.7 0.7 - 4.0 K/uL   Monocytes Relative 14 %   Monocytes Absolute 0.7 0.1 - 1.0 K/uL   Eosinophils Relative 4 %   Eosinophils Absolute 0.2 0.0 - 0.5 K/uL   Basophils Relative 1 %   Basophils Absolute 0.0 0.0 - 0.1 K/uL   Immature Granulocytes 2 %   Abs Immature Granulocytes 0.08 (H) 0.00 - 0.07 K/uL    Comment: Performed at [facility], [address] [place], [place] [address]   VAS [place] LOWER EXTREMITY VENOUS (DVT) (7a-7p)  Result Date: 06/05/2023  Lower Venous DVT Study Patient Name:  Marie Johnson  Date of Exam:   06/05/2023 Medical Rec #: [id number]  Accession #:    [id number] Date of Birth: March 19, 1951       Patient Gender: F Patient Age:   7 years Exam Location:  [facility] Procedure:       VAS [place] LOWER EXTREMITY VENOUS (DVT) Referring Phys: [facility] --------------------------------------------------------------------------------  Indications: Swelling.  Risk Factors: None identified. Limitations: Poor ultrasound/tissue interface and patient pain tolerance. Comparison Study: No prior studies. Performing Technologist: Chanda Busing RVT  Examination Guidelines: A complete evaluation includes B-mode imaging, spectral Doppler, color Doppler, and power Doppler as needed of all accessible portions of each vessel. Bilateral testing is considered an integral part of a complete examination. Limited examinations for reoccurring indications may be performed as noted. The reflux portion of the exam is performed with the patient in reverse Trendelenburg.  +---------+---------------+---------+-----------+----------+--------------+ RIGHT    CompressibilityPhasicitySpontaneityPropertiesThrombus Aging +---------+---------------+---------+-----------+----------+--------------+ CFV      Full           Yes      Yes                                 +---------+---------------+---------+-----------+----------+--------------+ SFJ      Full                                                        +---------+---------------+---------+-----------+----------+--------------+ FV Prox  Full                                                        +---------+---------------+---------+-----------+----------+--------------+ FV Mid   Full                                                        +---------+---------------+---------+-----------+----------+--------------+ FV DistalFull                                                        +---------+---------------+---------+-----------+----------+--------------+ PFV      Full                                                        +---------+---------------+---------+-----------+----------+--------------+ POP      Full            Yes      Yes                                 +---------+---------------+---------+-----------+----------+--------------+ PTV      Full                                                        +---------+---------------+---------+-----------+----------+--------------+  PERO     Full                                                        +---------+---------------+---------+-----------+----------+--------------+   +----+---------------+---------+-----------+----------+--------------+ LEFTCompressibilityPhasicitySpontaneityPropertiesThrombus Aging +----+---------------+---------+-----------+----------+--------------+ CFV Full           Yes      Yes                                 +----+---------------+---------+-----------+----------+--------------+     Summary: RIGHT: - There is no evidence of deep vein thrombosis in the lower extremity. However, portions of this examination were limited- see technologist comments above.  - No cystic structure found in the popliteal fossa.  LEFT: - No evidence of common femoral vein obstruction.   *See table(s) above for measurements and observations. Electronically signed by Coral Else MD on 06/05/2023 at 12:34:33 PM.    Final    DG Tibia/Fibula Right  Result Date: 06/05/2023 CLINICAL DATA:  Cellulitis. EXAM: RIGHT TIBIA AND FIBULA - 2 VIEW COMPARISON:  None Available. FINDINGS: Osteopenia. No fracture or dislocation. No bony erosive changes. Vascular calcifications. No definite soft tissue gas by x-ray. Of note on the frontal view the tip of the medial malleolus is not included in the imaging field. Further workup as clinically appropriate. IMPRESSION: Osteopenia.  No acute osseous abnormality. Electronically Signed   By: Karen Kays M.D.   On: 06/05/2023 11:02    Pending Labs Unresulted Labs (From admission, onward)    None       Vitals/Pain Today's Vitals   06/05/23 0923 06/05/23 0933 06/05/23 0934 06/05/23 1317  BP: (!) 146/91      Pulse: (!) 101     Resp: 16     Temp: 98.2 F (36.8 C)   98.2 F (36.8 C)  TempSrc: Oral   Oral  SpO2: 100%     Weight:  59 kg    Height:  5' 7.5" (1.715 m)    PainSc:   5      Isolation Precautions No active isolations  Medications Medications  cefTRIAXone (ROCEPHIN) 1 g in sodium chloride 0.9 % 100 mL IVPB (has no administration in time range)  enoxaparin (LOVENOX) injection 40 mg (has no administration in time range)  cefTRIAXone (ROCEPHIN) 2 g in sodium chloride 0.9 % 100 mL IVPB (2 g Intravenous New Bag/Given 06/05/23 1043)  oxyCODONE-acetaminophen (PERCOCET/ROXICET) 5-325 MG per tablet 2 tablet (2 tablets Oral Given 06/05/23 1036)  fentaNYL (SUBLIMAZE) injection 50 mcg (50 mcg Intravenous Given 06/05/23 1233)    Mobility walks     Focused Assessments    R Recommendations: See Admitting Provider Note  Report given to:   Additional Notes:

## 2023-06-06 ENCOUNTER — Ambulatory Visit
Admission: RE | Admit: 2023-06-06 | Discharge: 2023-06-06 | Disposition: A | Discharge status: Home / Self Care | Payer: Medicare Other | Source: Ambulatory Visit | Attending: Radiation Oncology | Admitting: Radiation Oncology

## 2023-06-06 ENCOUNTER — Other Ambulatory Visit: Payer: Self-pay

## 2023-06-06 DIAGNOSIS — J42 Unspecified chronic bronchitis: Secondary | ICD-10-CM | POA: Diagnosis not present

## 2023-06-06 DIAGNOSIS — L03115 Cellulitis of right lower limb: Secondary | ICD-10-CM | POA: Diagnosis not present

## 2023-06-06 DIAGNOSIS — I739 Peripheral vascular disease, unspecified: Secondary | ICD-10-CM

## 2023-06-06 DIAGNOSIS — K219 Gastro-esophageal reflux disease without esophagitis: Secondary | ICD-10-CM

## 2023-06-06 DIAGNOSIS — C321 Malignant neoplasm of supraglottis: Secondary | ICD-10-CM | POA: Diagnosis not present

## 2023-06-06 DIAGNOSIS — E782 Mixed hyperlipidemia: Secondary | ICD-10-CM

## 2023-06-06 DIAGNOSIS — I5032 Chronic diastolic (congestive) heart failure: Secondary | ICD-10-CM | POA: Diagnosis not present

## 2023-06-06 DIAGNOSIS — I10 Essential (primary) hypertension: Secondary | ICD-10-CM

## 2023-06-06 DIAGNOSIS — I2510 Atherosclerotic heart disease of native coronary artery without angina pectoris: Secondary | ICD-10-CM

## 2023-06-06 DIAGNOSIS — Z510 Encounter for antineoplastic radiation therapy: Secondary | ICD-10-CM | POA: Diagnosis not present

## 2023-06-06 DIAGNOSIS — Z720 Tobacco use: Secondary | ICD-10-CM

## 2023-06-06 LAB — CBC WITH DIFFERENTIAL/PLATELET
Abs Immature Granulocytes: 0.08 10*3/uL — ABNORMAL HIGH (ref 0.00–0.07)
Basophils Absolute: 0 10*3/uL (ref 0.0–0.1)
Basophils Relative: 1 %
Eosinophils Absolute: 0.2 10*3/uL (ref 0.0–0.5)
Eosinophils Relative: 3 %
HCT: 38 % (ref 36.0–46.0)
Hemoglobin: 12.1 g/dL (ref 12.0–15.0)
Immature Granulocytes: 2 %
Lymphocytes Relative: 12 %
Lymphs Abs: 0.6 10*3/uL — ABNORMAL LOW (ref 0.7–4.0)
MCH: 32.9 pg (ref 26.0–34.0)
MCHC: 31.8 g/dL (ref 30.0–36.0)
MCV: 103.3 fL — ABNORMAL HIGH (ref 80.0–100.0)
Monocytes Absolute: 0.5 10*3/uL (ref 0.1–1.0)
Monocytes Relative: 9 %
Neutro Abs: 3.9 10*3/uL (ref 1.7–7.7)
Neutrophils Relative %: 73 %
Platelets: 235 10*3/uL (ref 150–400)
RBC: 3.68 MIL/uL — ABNORMAL LOW (ref 3.87–5.11)
RDW: 20.6 % — ABNORMAL HIGH (ref 11.5–15.5)
WBC: 5.3 10*3/uL (ref 4.0–10.5)
nRBC: 0.6 % — ABNORMAL HIGH (ref 0.0–0.2)

## 2023-06-06 LAB — PHOSPHORUS: Phosphorus: 4.3 mg/dL (ref 2.5–4.6)

## 2023-06-06 LAB — RAD ONC ARIA SESSION SUMMARY
Course Elapsed Days: 15
Plan Fractions Treated to Date: 12
Plan Prescribed Dose Per Fraction: 2 Gy
Plan Total Fractions Prescribed: 35
Plan Total Prescribed Dose: 70 Gy
Reference Point Dosage Given to Date: 24 Gy
Reference Point Session Dosage Given: 2 Gy
Session Number: 12

## 2023-06-06 LAB — MAGNESIUM: Magnesium: 2.2 mg/dL (ref 1.7–2.4)

## 2023-06-06 LAB — COMPREHENSIVE METABOLIC PANEL
ALT: 21 U/L (ref 0–44)
AST: 25 U/L (ref 15–41)
Albumin: 3.7 g/dL (ref 3.5–5.0)
Alkaline Phosphatase: 56 U/L (ref 38–126)
Anion gap: 12 (ref 5–15)
BUN: 43 mg/dL — ABNORMAL HIGH (ref 8–23)
CO2: 31 mmol/L (ref 22–32)
Calcium: 9.9 mg/dL (ref 8.9–10.3)
Chloride: 93 mmol/L — ABNORMAL LOW (ref 98–111)
Creatinine, Ser: 0.83 mg/dL (ref 0.44–1.00)
GFR, Estimated: >60 mL/min (ref >60)
Glucose, Bld: 196 mg/dL — ABNORMAL HIGH (ref 70–99)
Potassium: 3.8 mmol/L (ref 3.5–5.1)
Sodium: 136 mmol/L (ref 135–145)
Total Bilirubin: 0.7 mg/dL (ref 0.3–1.2)
Total Protein: 7.5 g/dL (ref 6.5–8.1)

## 2023-06-06 LAB — GLUCOSE, CAPILLARY: Glucose-Capillary: 149 mg/dL — ABNORMAL HIGH (ref 70–99)

## 2023-06-06 MED ORDER — MEDIHONEY WOUND/BURN DRESSING EX PSTE
1.0000 | PASTE | Freq: Every day | CUTANEOUS | Status: DC
  Administered 2023-06-06 – 2023-06-09 (×4): 1 via TOPICAL
  Filled 2023-06-06: qty 44

## 2023-06-06 MED ORDER — ALBUTEROL SULFATE (2.5 MG/3ML) 0.083% IN NEBU
2.5000 mg | INHALATION_SOLUTION | Freq: Two times a day (BID) | RESPIRATORY_TRACT | Status: DC
  Administered 2023-06-06 – 2023-06-11 (×11): 2.5 mg via RESPIRATORY_TRACT
  Filled 2023-06-06 (×11): qty 3

## 2023-06-06 NOTE — Consult Note (Signed)
WOC Nurse Consult Note: Reason for Consult: RLE wound/cellulitis Patient with history of laryngeal CA, CHF, COPD, CAD, PVD, and a smoker. Trauma to the RLE a few days ago.  Wound type: trauma in the presence of PVD/?PAD.  Eschar with surrounding dark purple tissue;posterior right calf Fibrinous full thickness wound; LLE; pretibial  Pressure Injury POA: NA Measurement: see nursing flow sheets Wound bed: see above  Drainage (amount, consistency, odor) none documented  Periwound: erythema bilaterally; R>L; chronic skin changes related to PVD Dressing procedure/placement/frequency:   Cleanse RLE wounds (posterior calf) and LLE (pretibial) with saline, pat dry  Apply single layer of xeroform to the RLE wounds, top with foam  Apply small amount of Medihoney to the LLE open wound daily, cover with dry 2x2 and foam.  Change both daily   Consider ABIs to rule out arterial disease Consider vascular referral based on results. Conservative topical care ordered.   Re consult if needed, will not follow at this time. Thanks  Rajon Bisig [employer], RN,CWOCN, CNS, CWON-AP [phone])

## 2023-06-06 NOTE — Hospital Course (Addendum)
The patient is a 72 year old Caucasian female with past medical history significant for abdominal AAA, anemia, anxiety, aortic atherosclerosis, osteoarthritis, asthma, nonobstructive CAD, chronic diastolic CHF, carotid artery disease, congenital heart disease, COPD, GERD, hyperlipidemia, hypertension, osteoporosis, PAD, history of bilateral femoral-popliteal bypass, history of CABG, history of prediabetes, history of PVC, PUD, peripheral vascular disease as well as tobacco abuse and laryngeal cancer who presented to the emergency department with complaints of progressively worsening edema, erythema, calor and tender right lower extremity after she bumped her leg several days ago.  She is found to have a cellulitis and currently being treated.  Imaging showed the right tibia and fibula was done and showed no acute abnormality.  She is admitted for acute cellulitis and was given IV ceftriaxone, fentanyl, oxycodone and acetaminophen in the ED.   Cellulitis is slowly improving and will need further pain control. Anticipating D/C in the next 24 hours as she continues to improve.  Assessment and Plan:  Cellulitis of right lower extremity, improving  -Admit to MedSurg/inpatient. -DG Tibia Fibula showed "Osteopenia. No fracture or dislocation. No bony erosive changes. Vascular calcifications. No definite soft tissue gas by x-ray. Of note on the frontal view the tip of the medial malleolus is not included in the imaging field. Further workup as clinically appropriate." -Continue ceftriaxone 1 g IVPB daily. -Analgesics as needed. -WBC Trend: Recent Labs  Lab 06/05/23 1006 06/06/23 0947 06/07/23 0543  WBC 5.2 5.3 5.5  -WOC nurse consulted and recommending "Cleanse RLE wounds (posterior calf) and LLE (pretibial) with saline, pat dry  Apply single layer of xeroform to the RLE wounds, top with foam  Apply small amount of Medihoney to the LLE open wound daily, cover with dry 2x2 and foam. Change both  daily" -Will continue IV Ceftriaxone and transition to po in the AM she is having quite a bit of pain still   Coronary artery disease involving native coronary artery of native heart -Continue on atorvastatin, ezetimibe, DAPT and verapamil. -No beta-blocker due to COPD. -Follow-up with cardiology as an outpatient.   COPD (chronic obstructive pulmonary disease) (HCC) -Continue Spiriva 2 puffs daily. -Continue albuterol as needed.   Hypertension -Continue verapamil SR 240 mg p.o. daily. -Continue spironolactone 25 mg p.o. daily. -Continue furosemide per home instructions. -Continue to Monitor BP per Protocol -Last BP Reading was 136/69   Chronic Diastolic CHF (congestive heart failure) (HCC) -No signs of decompensation. -Strict I's and O's and daily weights;  Intake/Output Summary (Last 24 hours) at 06/07/2023 1612 Last data filed at 06/06/2023 1705 Gross per 24 hour  Intake 100 ml  Output --  Net 100 ml  -Continue diuretics as above.   PAD (peripheral artery disease) (HCC) -Continue antihyperlipidemics and DAPT as above. -Follow-up with vascular surgery as an outpatient but will check ABI here -ABI done and showed "Summary:  Right: Resting right ankle-brachial index indicates moderate right lower  extremity arterial disease. The right toe-brachial index is abnormal.   Left: Resting left ankle-brachial index indicates moderate left lower  extremity arterial disease. The left toe-brachial index is abnormal. "  Macrocytic Anemia -Hgb/Hct Trend: Recent Labs  Lab 06/05/23 1006 06/06/23 0947 06/07/23 0543  HGB 11.5* 12.1 11.6*  HCT 35.6* 38.0 36.4  MCV 101.4* 103.3* 101.7*  -Check Anemia Panel in the AM -Continue to Monitor for S/Sx of Bleeding; no overt bleeding noted -Repeat CBC in the AM   Tobacco Abuse -In remission.   Aortic atherosclerosis (HCC) Hyperlipidemia -Continue Atorvastatin 80 mg p.o. daily and Ezetimibe  10 mg p.o. daily. -On DAPT as above   GERD  (Gastroesophageal Reflux Disease)/GI Prophylaxis -Continue PPI with Pantoprazole 40 mg p.o. twice daily. -Continue calcium carbonate as needed.   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

## 2023-06-06 NOTE — TOC CM/SW Note (Signed)
Transition of Care [facility]) - Inpatient Brief Assessment   Patient Details  Name: Marie Johnson MRN: [id number] Date of Birth: 03-16-1951  Transition of Care [facility]) CM/SW Contact:    Otelia Santee, LCSW Phone Number: 06/06/2023, 12:36 PM   Clinical Narrative: Met with pt to discuss SDOH concern for food insecurities and consult for medication assistance. Pt shares she receives $21 in EBT a month. She shares she typically has enough to cover her expenses however, due to cost of medications and tx bills she has been struggling to cover all of her needs. Pt has documents together and is supposed to meet with a financial counselor regarding getting bills reduced or further assistance. Additional financial resources have been added to pt's chart. CSW encouraged pt to follow up with financial counselor post discharge.    Transition of Care Asessment: Insurance and Status: Insurance coverage has been reviewed Patient has primary care physician: Yes Home environment has been reviewed: Home Prior level of function:: Independent Prior/Current Home Services: No current home services Social Determinants of Health Reivew: SDOH reviewed interventions complete (Financial assistance resources added to AVS) Readmission risk has been reviewed: Yes Transition of care needs: transition of care needs identified, TOC will continue to follow

## 2023-06-06 NOTE — Progress Notes (Signed)
PROGRESS NOTE    Marie Johnson  ZOX:[id number] DOB: 28-Sep-1950 DOA: 06/05/2023 PCP: Ailene Ravel, MD   Brief Narrative:  The patient is a 72 year old Caucasian female with past medical history significant for abdominal AAA, anemia, anxiety, aortic atherosclerosis, osteoarthritis, asthma, nonobstructive CAD, chronic diastolic CHF, carotid artery disease, congenital heart disease, COPD, GERD, hyperlipidemia, hypertension, osteoporosis, PAD, history of bilateral femoral-popliteal bypass, history of CABG, history of prediabetes, history of PVC, PUD, peripheral vascular disease as well as tobacco abuse and laryngeal cancer who presented to the emergency department with complaints of progressively worsening edema, erythema, calor and tender right lower extremity after she bumped her leg several days ago.  She is found to have a cellulitis and currently being treated.  Imaging showed the right tibia and fibula was done and showed no acute abnormality.  She is admitted for acute cellulitis and was given IV ceftriaxone, fentanyl, oxycodone and acetaminophen in the ED.    Assessment and Plan:  Cellulitis of right lower extremity -Admit to MedSurg/inpatient. -Continue ceftriaxone 1 g IVPB daily. -Analgesics as needed. -WBC Trend: Recent Labs  Lab 06/05/23 1006 06/06/23 0947  WBC 5.2 5.3  -WOC nurse consulted and recommending "Cleanse RLE wounds (posterior calf) and LLE (pretibial) with saline, pat dry  Apply single layer of xeroform to the RLE wounds, top with foam  Apply small amount of Medihoney to the LLE open wound daily, cover with dry 2x2 and foam.  Change both daily"  Coronary artery disease involving native coronary artery of native heart -Continue on atorvastatin, ezetimibe, DAPT and verapamil. -No beta-blocker due to COPD. -Follow-up with cardiology as an outpatient.   COPD (chronic obstructive pulmonary disease) (HCC) -Continue Spiriva 2 puffs daily. -Continue albuterol as  needed.   Hypertension -Continue verapamil SR 240 mg p.o. daily. -Continue spironolactone 25 mg p.o. daily. -Continue furosemide per home instructions. -Continue to Monitor BP per Protocol -Last BP Reading was    Chronic Diastolic CHF (congestive heart failure) (HCC) -No signs of decompensation. -Strict I's and O's and daily weights;  Intake/Output Summary (Last 24 hours) at 06/06/2023 1945 Last data filed at 06/06/2023 1705 Gross per 24 hour  Intake 220 ml  Output --  Net 220 ml  -Continue diuretics as above.   PAD (peripheral artery disease) (HCC) -Continue antihyperlipidemics and DAPT as above. -Follow-up with vascular surgery as an outpatient but will check ABI here  Macrocytic Anemia -Hgb/Hct Trend: Recent Labs  Lab 06/05/23 1006 06/06/23 0947  HGB 11.5* 12.1  HCT 35.6* 38.0  MCV 101.4* 103.3*  -Check Anemia Panel in the AM -Continue to Monitor for S/Sx of Bleeding; no overt bleeding noted -Repeat CBC in the AM   Tobacco abuse In remission.   Aortic atherosclerosis (HCC) Hyperlipidemia -Continue Atorvastatin 80 mg p.o. daily and Ezetimibe 10 mg p.o. daily. -On DAPT   GERD (gastroesophageal reflux disease)/GI Prophylaxis -Continue PPI with Pantoprazole 40 mg p.o. twice daily. -Continue calcium carbonate as needed.   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) -Scheduled for radiation therapy today -Continue per Rad Onc   DVT prophylaxis: enoxaparin (LOVENOX) injection 40 mg Start: 06/05/23 2200    Code Status: Full Code Family Communication: No family present at bedside   Disposition Plan:  Level of care: Med-Surg Status is: Inpatient Remains inpatient appropriate because: Needs further clinical improvement in Cellulitis   Consultants:  None  Procedures:  As delineated as above  Antimicrobials:  Anti-infectives (From admission, onward)    Start  PROGRESS NOTE    Marie Johnson  ZOX:[id number] DOB: 28-Sep-1950 DOA: 06/05/2023 PCP: Ailene Ravel, MD   Brief Narrative:  The patient is a 72 year old Caucasian female with past medical history significant for abdominal AAA, anemia, anxiety, aortic atherosclerosis, osteoarthritis, asthma, nonobstructive CAD, chronic diastolic CHF, carotid artery disease, congenital heart disease, COPD, GERD, hyperlipidemia, hypertension, osteoporosis, PAD, history of bilateral femoral-popliteal bypass, history of CABG, history of prediabetes, history of PVC, PUD, peripheral vascular disease as well as tobacco abuse and laryngeal cancer who presented to the emergency department with complaints of progressively worsening edema, erythema, calor and tender right lower extremity after she bumped her leg several days ago.  She is found to have a cellulitis and currently being treated.  Imaging showed the right tibia and fibula was done and showed no acute abnormality.  She is admitted for acute cellulitis and was given IV ceftriaxone, fentanyl, oxycodone and acetaminophen in the ED.    Assessment and Plan:  Cellulitis of right lower extremity -Admit to MedSurg/inpatient. -Continue ceftriaxone 1 g IVPB daily. -Analgesics as needed. -WBC Trend: Recent Labs  Lab 06/05/23 1006 06/06/23 0947  WBC 5.2 5.3  -WOC nurse consulted and recommending "Cleanse RLE wounds (posterior calf) and LLE (pretibial) with saline, pat dry  Apply single layer of xeroform to the RLE wounds, top with foam  Apply small amount of Medihoney to the LLE open wound daily, cover with dry 2x2 and foam.  Change both daily"  Coronary artery disease involving native coronary artery of native heart -Continue on atorvastatin, ezetimibe, DAPT and verapamil. -No beta-blocker due to COPD. -Follow-up with cardiology as an outpatient.   COPD (chronic obstructive pulmonary disease) (HCC) -Continue Spiriva 2 puffs daily. -Continue albuterol as  needed.   Hypertension -Continue verapamil SR 240 mg p.o. daily. -Continue spironolactone 25 mg p.o. daily. -Continue furosemide per home instructions. -Continue to Monitor BP per Protocol -Last BP Reading was    Chronic Diastolic CHF (congestive heart failure) (HCC) -No signs of decompensation. -Strict I's and O's and daily weights;  Intake/Output Summary (Last 24 hours) at 06/06/2023 1945 Last data filed at 06/06/2023 1705 Gross per 24 hour  Intake 220 ml  Output --  Net 220 ml  -Continue diuretics as above.   PAD (peripheral artery disease) (HCC) -Continue antihyperlipidemics and DAPT as above. -Follow-up with vascular surgery as an outpatient but will check ABI here  Macrocytic Anemia -Hgb/Hct Trend: Recent Labs  Lab 06/05/23 1006 06/06/23 0947  HGB 11.5* 12.1  HCT 35.6* 38.0  MCV 101.4* 103.3*  -Check Anemia Panel in the AM -Continue to Monitor for S/Sx of Bleeding; no overt bleeding noted -Repeat CBC in the AM   Tobacco abuse In remission.   Aortic atherosclerosis (HCC) Hyperlipidemia -Continue Atorvastatin 80 mg p.o. daily and Ezetimibe 10 mg p.o. daily. -On DAPT   GERD (gastroesophageal reflux disease)/GI Prophylaxis -Continue PPI with Pantoprazole 40 mg p.o. twice daily. -Continue calcium carbonate as needed.   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 HbA1C: No results for input(s): "HGBA1C" in the last 72 hours. CBG: Recent Labs  Lab 06/06/23 0059  GLUCAP 149*   Lipid Profile: No results for input(s): "CHOL", "HDL", "LDLCALC", "TRIG", "CHOLHDL", "LDLDIRECT" in the last 72 hours. Thyroid Function Tests: No results for input(s): "TSH", "T4TOTAL", "FREET4", "T3FREE", "THYROIDAB" in the last 72 hours. Anemia Panel: No results for input(s): "VITAMINB12", "FOLATE", "FERRITIN", "TIBC", "IRON", "RETICCTPCT" in the last 72 hours. Sepsis Labs: No results for input(s): "PROCALCITON", "LATICACIDVEN" in the last 168 hours.  No results found for this or any previous visit (from the past 240 hour(s)).   Radiology Studies: VAS [place] LOWER EXTREMITY VENOUS (DVT) (7a-7p)  Result Date:  06/05/2023  Lower Venous DVT Study Patient Name:  ARIANA CALZADO  Date of Exam:   06/05/2023 Medical Rec #: [id number]       Accession #:    [id number] Date of Birth: Aug 04, 1951       Patient Gender: F Patient Age:   27 years Exam Location:  [facility] Procedure:      VAS [place] LOWER EXTREMITY VENOUS (DVT) Referring Phys: [facility] --------------------------------------------------------------------------------  Indications: Swelling.  Risk Factors: None identified. Limitations: Poor ultrasound/tissue interface and patient pain tolerance. Comparison Study: No prior studies. Performing Technologist: Chanda Busing RVT  Examination Guidelines: A complete evaluation includes B-mode imaging, spectral Doppler, color Doppler, and power Doppler as needed of all accessible portions of each vessel. Bilateral testing is considered an integral part of a complete examination. Limited examinations for reoccurring indications may be performed as noted. The reflux portion of the exam is performed with the patient in reverse Trendelenburg.  +---------+---------------+---------+-----------+----------+--------------+ RIGHT    CompressibilityPhasicitySpontaneityPropertiesThrombus Aging +---------+---------------+---------+-----------+----------+--------------+ CFV      Full           Yes      Yes                                 +---------+---------------+---------+-----------+----------+--------------+ SFJ      Full                                                        +---------+---------------+---------+-----------+----------+--------------+ FV Prox  Full                                                        +---------+---------------+---------+-----------+----------+--------------+ FV Mid   Full                                                        +---------+---------------+---------+-----------+----------+--------------+ FV DistalFull                                                         +---------+---------------+---------+-----------+----------+--------------+ PFV  PROGRESS NOTE    Marie Johnson  ZOX:[id number] DOB: 28-Sep-1950 DOA: 06/05/2023 PCP: Ailene Ravel, MD   Brief Narrative:  The patient is a 72 year old Caucasian female with past medical history significant for abdominal AAA, anemia, anxiety, aortic atherosclerosis, osteoarthritis, asthma, nonobstructive CAD, chronic diastolic CHF, carotid artery disease, congenital heart disease, COPD, GERD, hyperlipidemia, hypertension, osteoporosis, PAD, history of bilateral femoral-popliteal bypass, history of CABG, history of prediabetes, history of PVC, PUD, peripheral vascular disease as well as tobacco abuse and laryngeal cancer who presented to the emergency department with complaints of progressively worsening edema, erythema, calor and tender right lower extremity after she bumped her leg several days ago.  She is found to have a cellulitis and currently being treated.  Imaging showed the right tibia and fibula was done and showed no acute abnormality.  She is admitted for acute cellulitis and was given IV ceftriaxone, fentanyl, oxycodone and acetaminophen in the ED.    Assessment and Plan:  Cellulitis of right lower extremity -Admit to MedSurg/inpatient. -Continue ceftriaxone 1 g IVPB daily. -Analgesics as needed. -WBC Trend: Recent Labs  Lab 06/05/23 1006 06/06/23 0947  WBC 5.2 5.3  -WOC nurse consulted and recommending "Cleanse RLE wounds (posterior calf) and LLE (pretibial) with saline, pat dry  Apply single layer of xeroform to the RLE wounds, top with foam  Apply small amount of Medihoney to the LLE open wound daily, cover with dry 2x2 and foam.  Change both daily"  Coronary artery disease involving native coronary artery of native heart -Continue on atorvastatin, ezetimibe, DAPT and verapamil. -No beta-blocker due to COPD. -Follow-up with cardiology as an outpatient.   COPD (chronic obstructive pulmonary disease) (HCC) -Continue Spiriva 2 puffs daily. -Continue albuterol as  needed.   Hypertension -Continue verapamil SR 240 mg p.o. daily. -Continue spironolactone 25 mg p.o. daily. -Continue furosemide per home instructions. -Continue to Monitor BP per Protocol -Last BP Reading was    Chronic Diastolic CHF (congestive heart failure) (HCC) -No signs of decompensation. -Strict I's and O's and daily weights;  Intake/Output Summary (Last 24 hours) at 06/06/2023 1945 Last data filed at 06/06/2023 1705 Gross per 24 hour  Intake 220 ml  Output --  Net 220 ml  -Continue diuretics as above.   PAD (peripheral artery disease) (HCC) -Continue antihyperlipidemics and DAPT as above. -Follow-up with vascular surgery as an outpatient but will check ABI here  Macrocytic Anemia -Hgb/Hct Trend: Recent Labs  Lab 06/05/23 1006 06/06/23 0947  HGB 11.5* 12.1  HCT 35.6* 38.0  MCV 101.4* 103.3*  -Check Anemia Panel in the AM -Continue to Monitor for S/Sx of Bleeding; no overt bleeding noted -Repeat CBC in the AM   Tobacco abuse In remission.   Aortic atherosclerosis (HCC) Hyperlipidemia -Continue Atorvastatin 80 mg p.o. daily and Ezetimibe 10 mg p.o. daily. -On DAPT   GERD (gastroesophageal reflux disease)/GI Prophylaxis -Continue PPI with Pantoprazole 40 mg p.o. twice daily. -Continue calcium carbonate as needed.   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) -Scheduled for radiation therapy today -Continue per Rad Onc   DVT prophylaxis: enoxaparin (LOVENOX) injection 40 mg Start: 06/05/23 2200    Code Status: Full Code Family Communication: No family present at bedside   Disposition Plan:  Level of care: Med-Surg Status is: Inpatient Remains inpatient appropriate because: Needs further clinical improvement in Cellulitis   Consultants:  None  Procedures:  As delineated as above  Antimicrobials:  Anti-infectives (From admission, onward)    Start  PROGRESS NOTE    Marie Johnson  ZOX:[id number] DOB: 28-Sep-1950 DOA: 06/05/2023 PCP: Ailene Ravel, MD   Brief Narrative:  The patient is a 72 year old Caucasian female with past medical history significant for abdominal AAA, anemia, anxiety, aortic atherosclerosis, osteoarthritis, asthma, nonobstructive CAD, chronic diastolic CHF, carotid artery disease, congenital heart disease, COPD, GERD, hyperlipidemia, hypertension, osteoporosis, PAD, history of bilateral femoral-popliteal bypass, history of CABG, history of prediabetes, history of PVC, PUD, peripheral vascular disease as well as tobacco abuse and laryngeal cancer who presented to the emergency department with complaints of progressively worsening edema, erythema, calor and tender right lower extremity after she bumped her leg several days ago.  She is found to have a cellulitis and currently being treated.  Imaging showed the right tibia and fibula was done and showed no acute abnormality.  She is admitted for acute cellulitis and was given IV ceftriaxone, fentanyl, oxycodone and acetaminophen in the ED.    Assessment and Plan:  Cellulitis of right lower extremity -Admit to MedSurg/inpatient. -Continue ceftriaxone 1 g IVPB daily. -Analgesics as needed. -WBC Trend: Recent Labs  Lab 06/05/23 1006 06/06/23 0947  WBC 5.2 5.3  -WOC nurse consulted and recommending "Cleanse RLE wounds (posterior calf) and LLE (pretibial) with saline, pat dry  Apply single layer of xeroform to the RLE wounds, top with foam  Apply small amount of Medihoney to the LLE open wound daily, cover with dry 2x2 and foam.  Change both daily"  Coronary artery disease involving native coronary artery of native heart -Continue on atorvastatin, ezetimibe, DAPT and verapamil. -No beta-blocker due to COPD. -Follow-up with cardiology as an outpatient.   COPD (chronic obstructive pulmonary disease) (HCC) -Continue Spiriva 2 puffs daily. -Continue albuterol as  needed.   Hypertension -Continue verapamil SR 240 mg p.o. daily. -Continue spironolactone 25 mg p.o. daily. -Continue furosemide per home instructions. -Continue to Monitor BP per Protocol -Last BP Reading was    Chronic Diastolic CHF (congestive heart failure) (HCC) -No signs of decompensation. -Strict I's and O's and daily weights;  Intake/Output Summary (Last 24 hours) at 06/06/2023 1945 Last data filed at 06/06/2023 1705 Gross per 24 hour  Intake 220 ml  Output --  Net 220 ml  -Continue diuretics as above.   PAD (peripheral artery disease) (HCC) -Continue antihyperlipidemics and DAPT as above. -Follow-up with vascular surgery as an outpatient but will check ABI here  Macrocytic Anemia -Hgb/Hct Trend: Recent Labs  Lab 06/05/23 1006 06/06/23 0947  HGB 11.5* 12.1  HCT 35.6* 38.0  MCV 101.4* 103.3*  -Check Anemia Panel in the AM -Continue to Monitor for S/Sx of Bleeding; no overt bleeding noted -Repeat CBC in the AM   Tobacco abuse In remission.   Aortic atherosclerosis (HCC) Hyperlipidemia -Continue Atorvastatin 80 mg p.o. daily and Ezetimibe 10 mg p.o. daily. -On DAPT   GERD (gastroesophageal reflux disease)/GI Prophylaxis -Continue PPI with Pantoprazole 40 mg p.o. twice daily. -Continue calcium carbonate as needed.   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

## 2023-06-07 ENCOUNTER — Other Ambulatory Visit: Payer: Self-pay

## 2023-06-07 ENCOUNTER — Inpatient Hospital Stay (HOSPITAL_COMMUNITY): Payer: Medicare Other

## 2023-06-07 ENCOUNTER — Ambulatory Visit
Admission: RE | Admit: 2023-06-07 | Discharge: 2023-06-07 | Disposition: A | Discharge status: Home / Self Care | Payer: Medicare Other | Source: Ambulatory Visit | Attending: Radiation Oncology

## 2023-06-07 DIAGNOSIS — Z510 Encounter for antineoplastic radiation therapy: Secondary | ICD-10-CM | POA: Diagnosis not present

## 2023-06-07 DIAGNOSIS — J42 Unspecified chronic bronchitis: Secondary | ICD-10-CM | POA: Diagnosis not present

## 2023-06-07 DIAGNOSIS — M79606 Pain in leg, unspecified: Secondary | ICD-10-CM | POA: Diagnosis not present

## 2023-06-07 DIAGNOSIS — I5032 Chronic diastolic (congestive) heart failure: Secondary | ICD-10-CM | POA: Diagnosis not present

## 2023-06-07 DIAGNOSIS — I700 Atherosclerosis of aorta: Secondary | ICD-10-CM

## 2023-06-07 DIAGNOSIS — I2510 Atherosclerotic heart disease of native coronary artery without angina pectoris: Secondary | ICD-10-CM | POA: Diagnosis not present

## 2023-06-07 DIAGNOSIS — C321 Malignant neoplasm of supraglottis: Secondary | ICD-10-CM | POA: Diagnosis not present

## 2023-06-07 DIAGNOSIS — L03115 Cellulitis of right lower limb: Secondary | ICD-10-CM | POA: Diagnosis not present

## 2023-06-07 LAB — RAD ONC ARIA SESSION SUMMARY
Course Elapsed Days: 16
Plan Fractions Treated to Date: 13
Plan Prescribed Dose Per Fraction: 2 Gy
Plan Total Fractions Prescribed: 35
Plan Total Prescribed Dose: 70 Gy
Reference Point Dosage Given to Date: 26 Gy
Reference Point Session Dosage Given: 2 Gy
Session Number: 13

## 2023-06-07 LAB — CBC WITH DIFFERENTIAL/PLATELET
Abs Immature Granulocytes: 0.07 10*3/uL (ref 0.00–0.07)
Basophils Absolute: 0 10*3/uL (ref 0.0–0.1)
Basophils Relative: 1 %
Eosinophils Absolute: 0.4 10*3/uL (ref 0.0–0.5)
Eosinophils Relative: 7 %
HCT: 36.4 % (ref 36.0–46.0)
Hemoglobin: 11.6 g/dL — ABNORMAL LOW (ref 12.0–15.0)
Immature Granulocytes: 1 %
Lymphocytes Relative: 17 %
Lymphs Abs: 0.9 10*3/uL (ref 0.7–4.0)
MCH: 32.4 pg (ref 26.0–34.0)
MCHC: 31.9 g/dL (ref 30.0–36.0)
MCV: 101.7 fL — ABNORMAL HIGH (ref 80.0–100.0)
Monocytes Absolute: 0.7 10*3/uL (ref 0.1–1.0)
Monocytes Relative: 13 %
Neutro Abs: 3.4 10*3/uL (ref 1.7–7.7)
Neutrophils Relative %: 61 %
Platelets: 237 10*3/uL (ref 150–400)
RBC: 3.58 MIL/uL — ABNORMAL LOW (ref 3.87–5.11)
RDW: 20.4 % — ABNORMAL HIGH (ref 11.5–15.5)
WBC: 5.5 10*3/uL (ref 4.0–10.5)
nRBC: 1.1 % — ABNORMAL HIGH (ref 0.0–0.2)

## 2023-06-07 LAB — COMPREHENSIVE METABOLIC PANEL
ALT: 22 U/L (ref 0–44)
AST: 24 U/L (ref 15–41)
Albumin: 3.6 g/dL (ref 3.5–5.0)
Alkaline Phosphatase: 52 U/L (ref 38–126)
Anion gap: 11 (ref 5–15)
BUN: 45 mg/dL — ABNORMAL HIGH (ref 8–23)
CO2: 30 mmol/L (ref 22–32)
Calcium: 9.8 mg/dL (ref 8.9–10.3)
Chloride: 93 mmol/L — ABNORMAL LOW (ref 98–111)
Creatinine, Ser: 0.7 mg/dL (ref 0.44–1.00)
GFR, Estimated: >60 mL/min (ref >60)
Glucose, Bld: 93 mg/dL (ref 70–99)
Potassium: 4.5 mmol/L (ref 3.5–5.1)
Sodium: 134 mmol/L — ABNORMAL LOW (ref 135–145)
Total Bilirubin: 0.6 mg/dL (ref 0.3–1.2)
Total Protein: 7.3 g/dL (ref 6.5–8.1)

## 2023-06-07 LAB — PHOSPHORUS: Phosphorus: 4.7 mg/dL — ABNORMAL HIGH (ref 2.5–4.6)

## 2023-06-07 LAB — MAGNESIUM: Magnesium: 2.2 mg/dL (ref 1.7–2.4)

## 2023-06-07 MED ORDER — GUAIFENESIN-DM 100-10 MG/5ML PO SYRP
5.0000 mL | ORAL_SOLUTION | ORAL | Status: DC | PRN
  Administered 2023-06-07 – 2023-06-08 (×2): 5 mL via ORAL
  Filled 2023-06-07 (×2): qty 5

## 2023-06-07 MED ORDER — SALINE SPRAY 0.65 % NA SOLN
1.0000 | NASAL | Status: DC | PRN
  Filled 2023-06-07: qty 44

## 2023-06-07 NOTE — Plan of Care (Signed)
  Problem: Activity: Goal: Ability to tolerate increased activity will improve Outcome: Progressing   Problem: Health Behavior/Discharge Planning: Goal: Ability to safely manage health-related needs after discharge will improve Outcome: Progressing   Problem: Education: Goal: Individualized Educational Video(s) Outcome: Progressing   Problem: Activity: Goal: Ability to return to baseline activity level will improve Outcome: Progressing   Problem: Health Behavior/Discharge Planning: Goal: Ability to safely manage health-related needs after discharge will improve Outcome: Progressing   Problem: Skin Integrity: Goal: Skin integrity will improve Outcome: Progressing   Problem: Health Behavior/Discharge Planning: Goal: Ability to manage health-related needs will improve Outcome: Progressing

## 2023-06-07 NOTE — Progress Notes (Signed)
PROGRESS NOTE    Marie Johnson  WGN:[id number] DOB: September 29, 1950 DOA: 06/05/2023 PCP: Ailene Ravel, MD   Brief Narrative:  The patient is a 72 year old Caucasian female with past medical history significant for abdominal AAA, anemia, anxiety, aortic atherosclerosis, osteoarthritis, asthma, nonobstructive CAD, chronic diastolic CHF, carotid artery disease, congenital heart disease, COPD, GERD, hyperlipidemia, hypertension, osteoporosis, PAD, history of bilateral femoral-popliteal bypass, history of CABG, history of prediabetes, history of PVC, PUD, peripheral vascular disease as well as tobacco abuse and laryngeal cancer who presented to the emergency department with complaints of progressively worsening edema, erythema, calor and tender right lower extremity after she bumped her leg several days ago.  She is found to have a cellulitis and currently being treated.  Imaging showed the right tibia and fibula was done and showed no acute abnormality.  She is admitted for acute cellulitis and was given IV ceftriaxone, fentanyl, oxycodone and acetaminophen in the ED.   Cellulitis is slowly improving and will need further pain control. Anticipating D/C in the next 24 hours as she continues to improve.  Assessment and Plan:  Cellulitis of right lower extremity, improving  -Admit to MedSurg/inpatient. -DG Tibia Fibula showed "Osteopenia. No fracture or dislocation. No bony erosive changes. Vascular calcifications. No definite soft tissue gas by x-ray. Of note on the frontal view the tip of the medial malleolus is not included in the imaging field. Further workup as clinically appropriate." -Continue ceftriaxone 1 g IVPB daily. -Analgesics as needed. -WBC Trend: Recent Labs  Lab 06/05/23 1006 06/06/23 0947 06/07/23 0543  WBC 5.2 5.3 5.5  -WOC nurse consulted and recommending "Cleanse RLE wounds (posterior calf) and LLE (pretibial) with saline, pat dry  Apply single layer of xeroform to the RLE  wounds, top with foam  Apply small amount of Medihoney to the LLE open wound daily, cover with dry 2x2 and foam. Change both daily" -Will continue IV Ceftriaxone and transition to po in the AM she is having quite a bit of pain still   Coronary artery disease involving native coronary artery of native heart -Continue on atorvastatin, ezetimibe, DAPT and verapamil. -No beta-blocker due to COPD. -Follow-up with cardiology as an outpatient.   COPD (chronic obstructive pulmonary disease) (HCC) -Continue Spiriva 2 puffs daily. -Continue albuterol as needed.   Hypertension -Continue verapamil SR 240 mg p.o. daily. -Continue spironolactone 25 mg p.o. daily. -Continue furosemide per home instructions. -Continue to Monitor BP per Protocol -Last BP Reading was 136/69   Chronic Diastolic CHF (congestive heart failure) (HCC) -No signs of decompensation. -Strict I's and O's and daily weights;  Intake/Output Summary (Last 24 hours) at 06/07/2023 1612 Last data filed at 06/06/2023 1705 Gross per 24 hour  Intake 100 ml  Output --  Net 100 ml  -Continue diuretics as above.   PAD (peripheral artery disease) (HCC) -Continue antihyperlipidemics and DAPT as above. -Follow-up with vascular surgery as an outpatient but will check ABI here -ABI done and showed "Summary:  Right: Resting right ankle-brachial index indicates moderate right lower  extremity arterial disease. The right toe-brachial index is abnormal.   Left: Resting left ankle-brachial index indicates moderate left lower  extremity arterial disease. The left toe-brachial index is abnormal. "  Macrocytic Anemia -Hgb/Hct Trend: Recent Labs  Lab 06/05/23 1006 06/06/23 0947 06/07/23 0543  HGB 11.5* 12.1 11.6*  HCT 35.6* 38.0 36.4  MCV 101.4* 103.3* 101.7*  -Check Anemia Panel in the AM -Continue to Monitor for S/Sx of Bleeding; no overt bleeding noted -Repeat  CBC in the AM   Tobacco Abuse -In remission.   Aortic atherosclerosis  (HCC) Hyperlipidemia -Continue Atorvastatin 80 mg p.o. daily and Ezetimibe 10 mg p.o. daily. -On DAPT as above   GERD (Gastroesophageal Reflux Disease)/GI Prophylaxis -Continue PPI with Pantoprazole 40 mg p.o. twice daily. -Continue calcium carbonate as needed.   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) -Scheduled for radiation therapy today -Continue per Rad Onc   DVT prophylaxis: enoxaparin (LOVENOX) injection 40 mg Start: 06/05/23 2200    Code Status: Full Code Family Communication: No family present at bedside   Disposition Plan:  Level of care: Med-Surg Status is: Inpatient Remains inpatient appropriate because: Needs further clinical improvement   Consultants:  None  Procedures:  As delineated as above  Antimicrobials:  Anti-infectives (From admission, onward)    Start     Dose/Rate Route Frequency Ordered Stop   06/06/23 1000  cefTRIAXone (ROCEPHIN) 1 g in sodium chloride 0.9 % 100 mL IVPB        1 g 200 mL/hr over 30 Minutes Intravenous Every 24 hours 06/05/23 1223 06/13/23 0959   06/05/23 1015  cefTRIAXone (ROCEPHIN) 2 g in sodium chloride 0.9 % 100 mL IVPB        2 g 200 mL/hr over 30 Minutes Intravenous  Once 06/05/23 1013 06/05/23 1113       Subjective: Seen and examined at bedside was still having quite a bit of pain on her right leg.  No nausea or vomiting.  Erythema is improving slowly.  Happy that she was able to get radiation.  Denied any other concerns or complaints at this time  Objective: Vitals:   06/06/23 1949 06/06/23 2023 06/07/23 0448 06/07/23 1232  BP:  122/65 (!) 142/74 136/69  Pulse:  98 80 72  Resp:  16 17   Temp:  97.8 F (36.6 C) (!) 97.5 F (36.4 C) 97.6 F (36.4 C)  TempSrc:  Oral Oral   SpO2: 96% 95% 97% 100%  Weight:      Height:        Intake/Output Summary (Last 24 hours) at 06/07/2023 1630 Last data filed at 06/06/2023 1705 Gross per 24 hour  Intake 100 ml  Output --  Net 100 ml   Filed Weights    06/05/23 0933  Weight: 59 kg   Examination: Physical Exam:  Constitutional: Thin elderly Caucasian female in no acute distress Respiratory: Diminished to auscultation bilaterally, no wheezing, rales, rhonchi or crackles. Normal respiratory effort and patient is not tachypenic. No accessory muscle use.  Unlabored breathing Cardiovascular: RRR, no murmurs / rubs / gallops. S1 and S2 auscultated.  Right lower extremity swelling is improving and erythema slowly improving Abdomen: Soft, non-tender, non-distended. Bowel sounds positive.  GU: Deferred. Musculoskeletal: No clubbing / cyanosis of digits/nails. No joint deformity upper and lower extremities. Skin: No rashes, lesions, ulcers on limited skin evaluation. No induration; Warm and dry.  Neurologic: CN 2-12 grossly intact with no focal deficits. Romberg sign and cerebellar reflexes not assessed.  Psychiatric: Normal judgment and insight. Alert and oriented x 3. Normal mood and appropriate affect.   Data Reviewed: I have personally reviewed following labs and imaging studies  CBC: Recent Labs  Lab 06/05/23 1006 06/06/23 0947 06/07/23 0543  WBC 5.2 5.3 5.5  NEUTROABS 3.4 3.9 3.4  HGB 11.5* 12.1 11.6*  HCT 35.6* 38.0 36.4  MCV 101.4* 103.3* 101.7*  PLT 211 235 237   Basic Metabolic Panel: Recent Labs  Lab 06/05/23 1006 06/06/23  6962 06/07/23 0543  NA 135 136 134*  K 4.3 3.8 4.5  CL 92* 93* 93*  CO2 33* 31 30  GLUCOSE 93 196* 93  BUN 38* 43* 45*  CREATININE 0.79 0.83 0.70  CALCIUM 9.9 9.9 9.8  MG  --  2.2 2.2  PHOS  --  4.3 4.7*   GFR: Estimated Creatinine Clearance: 57.5 mL/min (by C-G formula based on SCr of 0.7 mg/dL). Liver Function Tests: Recent Labs  Lab 06/05/23 1006 06/06/23 0947 06/07/23 0543  AST 21 25 24   ALT 21 21 22   ALKPHOS 54 56 52  BILITOT 0.8 0.7 0.6  PROT 7.4 7.5 7.3  ALBUMIN 3.8 3.7 3.6   No results for input(s): "LIPASE", "AMYLASE" in the last 168 hours. No results for input(s):  "AMMONIA" in the last 168 hours. Coagulation Profile: No results for input(s): "INR", "PROTIME" in the last 168 hours. Cardiac Enzymes: No results for input(s): "CKTOTAL", "CKMB", "CKMBINDEX", "TROPONINI" in the last 168 hours. BNP (last 3 results) No results for input(s): "PROBNP" in the last 8760 hours. HbA1C: No results for input(s): "HGBA1C" in the last 72 hours. CBG: Recent Labs  Lab 06/06/23 0059  GLUCAP 149*   Lipid Profile: No results for input(s): "CHOL", "HDL", "LDLCALC", "TRIG", "CHOLHDL", "LDLDIRECT" in the last 72 hours. Thyroid Function Tests: No results for input(s): "TSH", "T4TOTAL", "FREET4", "T3FREE", "THYROIDAB" in the last 72 hours. Anemia Panel: No results for input(s): "VITAMINB12", "FOLATE", "FERRITIN", "TIBC", "IRON", "RETICCTPCT" in the last 72 hours. Sepsis Labs: No results for input(s): "PROCALCITON", "LATICACIDVEN" in the last 168 hours.  No results found for this or any previous visit (from the past 240 hour(s)).   Radiology Studies: VAS [place] ABI WITH/WO TBI  Result Date: 06/07/2023  LOWER EXTREMITY DOPPLER STUDY Patient Name:  Marie Johnson  Date of Exam:   06/07/2023 Medical Rec #: [id number]       Accession #:    [id number] Date of Birth: 09-Feb-1951       Patient Gender: F Patient Age:   61 years Exam Location:  [facility] Procedure:      VAS [place] ABI WITH/WO TBI Referring Phys: Marguerita Merles --------------------------------------------------------------------------------  Indications: Rest pain.  Vascular Interventions: Left common femoral to above-knee popliteal                         bypass by                         Dr. Arbie Cookey about 20 years ago.                         On 03/16/2020 she underwent left common and                         external                         iliac artery angioplasty with stent placement                         03/18/2020 right common femoral endarterectomy with                         retrograde right common iliac artery  stenting and  then a                         right common femoral to below knee pop bypass with                         PTFE.                          07/26/2022                         Pre-operative Diagnosis: Critical limb ischemia of                         the                         left lower extremity with occluded left common                         femoral                         to above-knee popliteal prosthetic bypass                         Post-operative diagnosis: Same                         Surgeon: Cephus Shelling, MD                         Procedure Performed:                         1. Ultrasound-guided access right common femoral                         artery                         2. Aortogram with catheter selection of aorta                         3. Left lower extremity arteriogram with selection                         of                         third order branches                         4. Left femoral-popliteal bypass percutaneous                         mechanical                         thrombectomy (JETI)                         5. Angioplasty and stent of left common femoral to  above-knee popliteal bypass into the native                         above-knee                         popliteal artery (6 mm x 25 cm Viabahn and 6 mm x                         15 cm                         Viabahn all postdilated with a 5 mm Sterling)                         6. 91 minutes of monitored moderate conscious                         sedation time. Limitations: Today's exam was limited due to involuntary patient movement and              patient intolerant to cuff pressure. Comparison Study: 08/28/2022 - Right: Resting right ankle-brachial index is                   within normal range. The                   right toe-brachial index is abnormal.                    Left: Resting left ankle-brachial index is within normal                   range.  The left toe-brachial index is abnormal. Performing Technologist: Olen Cordial RVT  Examination Guidelines: A complete evaluation includes at minimum, Doppler waveform signals and systolic blood pressure reading at the level of bilateral brachial, anterior tibial, and posterior tibial arteries, when vessel segments are accessible. Bilateral testing is considered an integral part of a complete examination. Photoelectric Plethysmograph (PPG) waveforms and toe systolic pressure readings are included as required and additional duplex testing as needed. Limited examinations for reoccurring indications may be performed as noted.  ABI Findings: +---------+------------------+-----+-----------+--------+ Right    Rt Pressure (mmHg)IndexWaveform   Comment  +---------+------------------+-----+-----------+--------+ Brachial 123                    triphasic           +---------+------------------+-----+-----------+--------+ PTA                             monophasic          +---------+------------------+-----+-----------+--------+ DP       95                0.77 multiphasic         +---------+------------------+-----+-----------+--------+ Great Toe39                0.32                     +---------+------------------+-----+-----------+--------+ +---------+------------------+-----+----------+-------+ Left     Lt Pressure (mmHg)IndexWaveform  Comment +---------+------------------+-----+----------+-------+ Brachial 120                    triphasic         +---------+------------------+-----+----------+-------+  PTA      89                0.72 monophasic        +---------+------------------+-----+----------+-------+ DP       95                0.77 monophasic        +---------+------------------+-----+----------+-------+ Great Toe36                0.29                   +---------+------------------+-----+----------+-------+  +-------+-----------+-----------+------------+------------+ ABI/TBIToday's ABIToday's TBIPrevious ABIPrevious TBI +-------+-----------+-----------+------------+------------+ Right  0.77       0.32       1.04        0.39         +-------+-----------+-----------+------------+------------+ Left   0.77       0.29       1.06        0.47         +-------+-----------+-----------+------------+------------+  Summary: Right: Resting right ankle-brachial index indicates moderate right lower extremity arterial disease. The right toe-brachial index is abnormal. Left: Resting left ankle-brachial index indicates moderate left lower extremity arterial disease. The left toe-brachial index is abnormal. *See table(s) above for measurements and observations.     Preliminary     Scheduled Meds:  albuterol  2.5 mg Nebulization BID   allopurinol  300 mg Oral QPM   aspirin EC  81 mg Oral Daily   atorvastatin  80 mg Oral QHS   clopidogrel  75 mg Oral QHS   enoxaparin (LOVENOX) injection  40 mg Subcutaneous Q24H   ezetimibe  10 mg Oral QHS   feeding supplement  237 mL Oral BID BM   furosemide  40 mg Oral 2 times per day on Monday Wednesday Friday   And   furosemide  40 mg Oral Once per day on Sunday Tuesday Thursday Saturday   influenza vaccine adjuvanted  0.5 mL Intramuscular Tomorrow-1000   leptospermum manuka honey  1 Application Topical Daily   melatonin  5 mg Oral QHS   pantoprazole  40 mg Oral BID   potassium chloride  40 mEq Oral Once per day on Monday Wednesday Friday   And   potassium chloride  20 mEq Oral Once per day on Sunday Tuesday Thursday Saturday   spironolactone  25 mg Oral Daily   umeclidinium bromide  1 puff Inhalation Daily   verapamil  240 mg Oral Daily   Continuous Infusions:  cefTRIAXone (ROCEPHIN)  IV 1 g (06/07/23 1142)    LOS: 2 days   Marguerita Merles, DO [facility] Available via Epic secure chat 7am-7pm After these hours, please refer to coverage provider  listed on [email or web address] 06/07/2023, 4:30 PM

## 2023-06-07 NOTE — Plan of Care (Signed)
  Problem: Education: Goal: Understanding of cardiac disease, CV risk reduction, and recovery process will improve Outcome: Progressing Goal: Individualized Educational Video(s) Outcome: Progressing   Problem: Activity: Goal: Ability to tolerate increased activity will improve Outcome: Progressing   Problem: Cardiac: Goal: Ability to achieve and maintain adequate cardiovascular perfusion will improve Outcome: Progressing   Problem: Health Behavior/Discharge Planning: Goal: Ability to safely manage health-related needs after discharge will improve Outcome: Progressing   Problem: Education: Goal: Understanding of CV disease, CV risk reduction, and recovery process will improve Outcome: Progressing Goal: Individualized Educational Video(s) Outcome: Progressing   Problem: Activity: Goal: Ability to return to baseline activity level will improve Outcome: Progressing   Problem: Cardiovascular: Goal: Ability to achieve and maintain adequate cardiovascular perfusion will improve Outcome: Progressing Goal: Vascular access site(s) Level 0-1 will be maintained Outcome: Progressing   Problem: Health Behavior/Discharge Planning: Goal: Ability to safely manage health-related needs after discharge will improve Outcome: Progressing   Problem: Clinical Measurements: Goal: Ability to avoid or minimize complications of infection will improve Outcome: Progressing   Problem: Skin Integrity: Goal: Skin integrity will improve Outcome: Progressing   Problem: Education: Goal: Knowledge of General Education information will improve Description: Including pain rating scale, medication(s)/side effects and non-pharmacologic comfort measures Outcome: Progressing   Problem: Health Behavior/Discharge Planning: Goal: Ability to manage health-related needs will improve Outcome: Progressing   Problem: Clinical Measurements: Goal: Ability to maintain clinical measurements within normal limits  will improve Outcome: Progressing Goal: Will remain free from infection Outcome: Progressing Goal: Diagnostic test results will improve Outcome: Progressing Goal: Respiratory complications will improve Outcome: Progressing Goal: Cardiovascular complication will be avoided Outcome: Progressing   Problem: Activity: Goal: Risk for activity intolerance will decrease Outcome: Progressing   Problem: Nutrition: Goal: Adequate nutrition will be maintained Outcome: Progressing   Problem: Coping: Goal: Level of anxiety will decrease Outcome: Progressing   Problem: Elimination: Goal: Will not experience complications related to bowel motility Outcome: Progressing Goal: Will not experience complications related to urinary retention Outcome: Progressing   Problem: Pain Managment: Goal: General experience of comfort will improve Outcome: Progressing   Problem: Safety: Goal: Ability to remain free from injury will improve Outcome: Progressing   Problem: Skin Integrity: Goal: Risk for impaired skin integrity will decrease Outcome: Progressing

## 2023-06-07 NOTE — Progress Notes (Signed)
ABI's have been completed. Preliminary results can be found in CV Proc through chart review.   06/07/23 11:28 AM Olen Cordial RVT

## 2023-06-08 ENCOUNTER — Inpatient Hospital Stay (HOSPITAL_COMMUNITY): Payer: Medicare Other

## 2023-06-08 DIAGNOSIS — L03115 Cellulitis of right lower limb: Secondary | ICD-10-CM | POA: Diagnosis not present

## 2023-06-08 DIAGNOSIS — I5032 Chronic diastolic (congestive) heart failure: Secondary | ICD-10-CM | POA: Diagnosis not present

## 2023-06-08 DIAGNOSIS — J42 Unspecified chronic bronchitis: Secondary | ICD-10-CM | POA: Diagnosis not present

## 2023-06-08 DIAGNOSIS — I2510 Atherosclerotic heart disease of native coronary artery without angina pectoris: Secondary | ICD-10-CM | POA: Diagnosis not present

## 2023-06-08 LAB — CBC WITH DIFFERENTIAL/PLATELET
Abs Immature Granulocytes: 0.1 10*3/uL — ABNORMAL HIGH (ref 0.00–0.07)
Basophils Absolute: 0 10*3/uL (ref 0.0–0.1)
Basophils Relative: 1 %
Eosinophils Absolute: 0.3 10*3/uL (ref 0.0–0.5)
Eosinophils Relative: 5 %
HCT: 34.3 % — ABNORMAL LOW (ref 36.0–46.0)
Hemoglobin: 10.9 g/dL — ABNORMAL LOW (ref 12.0–15.0)
Immature Granulocytes: 2 %
Lymphocytes Relative: 11 %
Lymphs Abs: 0.6 10*3/uL — ABNORMAL LOW (ref 0.7–4.0)
MCH: 32.7 pg (ref 26.0–34.0)
MCHC: 31.8 g/dL (ref 30.0–36.0)
MCV: 103 fL — ABNORMAL HIGH (ref 80.0–100.0)
Monocytes Absolute: 0.5 10*3/uL (ref 0.1–1.0)
Monocytes Relative: 10 %
Neutro Abs: 3.9 10*3/uL (ref 1.7–7.7)
Neutrophils Relative %: 71 %
Platelets: 209 10*3/uL (ref 150–400)
RBC: 3.33 MIL/uL — ABNORMAL LOW (ref 3.87–5.11)
RDW: 20.2 % — ABNORMAL HIGH (ref 11.5–15.5)
WBC: 5.4 10*3/uL (ref 4.0–10.5)
nRBC: 0.6 % — ABNORMAL HIGH (ref 0.0–0.2)

## 2023-06-08 LAB — VITAMIN B12: Vitamin B-12: 666 pg/mL (ref 180–914)

## 2023-06-08 LAB — COMPREHENSIVE METABOLIC PANEL
ALT: 22 U/L (ref 0–44)
AST: 27 U/L (ref 15–41)
Albumin: 3.5 g/dL (ref 3.5–5.0)
Alkaline Phosphatase: 49 U/L (ref 38–126)
Anion gap: 8 (ref 5–15)
BUN: 37 mg/dL — ABNORMAL HIGH (ref 8–23)
CO2: 33 mmol/L — ABNORMAL HIGH (ref 22–32)
Calcium: 9.5 mg/dL (ref 8.9–10.3)
Chloride: 93 mmol/L — ABNORMAL LOW (ref 98–111)
Creatinine, Ser: 0.79 mg/dL (ref 0.44–1.00)
GFR, Estimated: >60 mL/min (ref >60)
Glucose, Bld: 102 mg/dL — ABNORMAL HIGH (ref 70–99)
Potassium: 4.8 mmol/L (ref 3.5–5.1)
Sodium: 134 mmol/L — ABNORMAL LOW (ref 135–145)
Total Bilirubin: 0.7 mg/dL (ref 0.3–1.2)
Total Protein: 6.8 g/dL (ref 6.5–8.1)

## 2023-06-08 LAB — IRON AND TIBC
Iron: 97 ug/dL (ref 28–170)
Saturation Ratios: 26 % (ref 10.4–31.8)
TIBC: 368 ug/dL (ref 250–450)
UIBC: 271 ug/dL

## 2023-06-08 LAB — RETICULOCYTES
Immature Retic Fract: 13.6 % (ref 2.3–15.9)
RBC.: 3.31 MIL/uL — ABNORMAL LOW (ref 3.87–5.11)
Retic Count, Absolute: 48.3 10*3/uL (ref 19.0–186.0)
Retic Ct Pct: 1.5 % (ref 0.4–3.1)

## 2023-06-08 LAB — FERRITIN: Ferritin: 130 ng/mL (ref 11–307)

## 2023-06-08 LAB — VAS US ABI WITH/WO TBI
Left ABI: 0.77
Right ABI: 0.77

## 2023-06-08 LAB — FOLATE: Folate: 18.3 ng/mL (ref >5.9)

## 2023-06-08 LAB — MAGNESIUM: Magnesium: 2.3 mg/dL (ref 1.7–2.4)

## 2023-06-08 LAB — PHOSPHORUS: Phosphorus: 4.3 mg/dL (ref 2.5–4.6)

## 2023-06-08 MED ORDER — BISACODYL 10 MG RE SUPP
10.0000 mg | Freq: Every day | RECTAL | Status: DC | PRN
  Administered 2023-06-09: 10 mg via RECTAL
  Filled 2023-06-08: qty 1

## 2023-06-08 MED ORDER — MORPHINE SULFATE (PF) 2 MG/ML IV SOLN
1.0000 mg | INTRAVENOUS | Status: DC | PRN
  Administered 2023-06-08 – 2023-06-09 (×2): 2 mg via INTRAVENOUS
  Filled 2023-06-08 (×2): qty 1

## 2023-06-08 MED ORDER — GUAIFENESIN ER 600 MG PO TB12
1200.0000 mg | ORAL_TABLET | Freq: Two times a day (BID) | ORAL | Status: DC
  Administered 2023-06-08 – 2023-06-11 (×7): 1200 mg via ORAL
  Filled 2023-06-08 (×7): qty 2

## 2023-06-08 MED ORDER — PHENOL 1.4 % MT LIQD
1.0000 | OROMUCOSAL | Status: DC | PRN
  Filled 2023-06-08: qty 177

## 2023-06-08 MED ORDER — FUROSEMIDE 10 MG/ML IJ SOLN
20.0000 mg | Freq: Once | INTRAMUSCULAR | Status: AC
  Administered 2023-06-08: 20 mg via INTRAVENOUS
  Filled 2023-06-08: qty 2

## 2023-06-08 MED ORDER — SENNOSIDES-DOCUSATE SODIUM 8.6-50 MG PO TABS
1.0000 | ORAL_TABLET | Freq: Two times a day (BID) | ORAL | Status: DC
  Administered 2023-06-08 – 2023-06-11 (×7): 1 via ORAL
  Filled 2023-06-08 (×7): qty 1

## 2023-06-08 MED ORDER — POLYETHYLENE GLYCOL 3350 17 G PO PACK
17.0000 g | PACK | Freq: Two times a day (BID) | ORAL | Status: DC
  Administered 2023-06-08 – 2023-06-11 (×7): 17 g via ORAL
  Filled 2023-06-08 (×8): qty 1

## 2023-06-08 NOTE — Progress Notes (Signed)
slight expiratory wheezing noted.  No appreciable rales, rhonchi or crackles. Normal respiratory effort and patient is not tachypenic. No accessory muscle use.  Wearing supplemental oxygen nasal cannula Cardiovascular: RRR, no murmurs / rubs / gallops. S1 and S2 auscultated.  Has 1+ lower extremity edema bilaterally slightly worse on the right compared to left Abdomen: Soft, non-tender, a little distended. Bowel sounds positive.  GU: Deferred. Musculoskeletal: No clubbing / cyanosis of digits/nails. No joint deformity upper and lower extremities.  Skin: Has right leg extremity erythema which is improving and warmth is also improved.  Has a black eschar  on the posterior portion of her leg Neurologic: CN 2-12 grossly intact with no focal deficits. Romberg sign and cerebellar reflexes not assessed.  Psychiatric: Normal judgment and insight. Alert and oriented x 3.  A little anxious  Data Reviewed: I have personally reviewed following labs and imaging studies  CBC: Recent Labs  Lab 06/05/23 1006 06/06/23 0947 06/07/23 0543 06/08/23 0620  WBC 5.2 5.3 5.5 5.4  NEUTROABS 3.4 3.9 3.4 3.9  HGB 11.5* 12.1 11.6* 10.9*  HCT 35.6* 38.0 36.4 34.3*  MCV 101.4* 103.3* 101.7* 103.0*  PLT 211 235 237 209   Basic Metabolic Panel: Recent Labs  Lab 06/05/23 1006 06/06/23 0947 06/07/23 0543 06/08/23 0620  NA 135 136 134* 134*  K 4.3 3.8 4.5 4.8  CL 92* 93* 93* 93*  CO2 33* 31 30 33*  GLUCOSE 93 196* 93 102*  BUN 38* 43* 45* 37*  CREATININE 0.79 0.83 0.70 0.79  CALCIUM 9.9 9.9 9.8 9.5  MG  --  2.2 2.2 2.3  PHOS  --  4.3 4.7* 4.3   GFR: Estimated Creatinine Clearance: 57.5 mL/min (by C-G formula based on SCr of 0.79 mg/dL). Liver Function Tests: Recent Labs  Lab 06/05/23 1006 06/06/23 0947 06/07/23 0543 06/08/23 0620  AST 21 25 24 27   ALT 21 21 22 22   ALKPHOS 54 56 52 49  BILITOT 0.8 0.7 0.6 0.7  PROT 7.4 7.5 7.3 6.8  ALBUMIN 3.8 3.7 3.6 3.5   No results for input(s): "LIPASE", "AMYLASE" in the last 168 hours. No results for input(s): "AMMONIA" in the last 168 hours. Coagulation Profile: No results for input(s): "INR", "PROTIME" in the last 168 hours. Cardiac Enzymes: No results for input(s): "CKTOTAL", "CKMB", "CKMBINDEX", "TROPONINI" in the last 168 hours. BNP (last 3 results) No results for input(s): "PROBNP" in the last 8760 hours. HbA1C: No results for input(s): "HGBA1C" in the last 72 hours. CBG: Recent Labs  Lab 06/06/23 0059  GLUCAP 149*   Lipid Profile: No results for input(s): "CHOL", "HDL", "LDLCALC", "TRIG", "CHOLHDL", "LDLDIRECT" in the last 72 hours. Thyroid Function Tests: No results for input(s):  "TSH", "T4TOTAL", "FREET4", "T3FREE", "THYROIDAB" in the last 72 hours. Anemia Panel: Recent Labs    06/08/23 0620  VITAMINB12 666  FOLATE 18.3  FERRITIN 130  TIBC 368  IRON 97  RETICCTPCT 1.5   Sepsis Labs: No results for input(s): "PROCALCITON", "LATICACIDVEN" in the last 168 hours.  No results found for this or any previous visit (from the past 240 hour(s)).   Radiology Studies: VAS [place] ABI WITH/WO TBI  Result Date: 06/08/2023  LOWER EXTREMITY DOPPLER STUDY Patient Name:  Marie Johnson  Date of Exam:   06/07/2023 Medical Rec #: [id number]       Accession #:    [id number] Date of Birth: [birth year]       Patient Gender: F Patient Age:   72 years Exam Location:  slight expiratory wheezing noted.  No appreciable rales, rhonchi or crackles. Normal respiratory effort and patient is not tachypenic. No accessory muscle use.  Wearing supplemental oxygen nasal cannula Cardiovascular: RRR, no murmurs / rubs / gallops. S1 and S2 auscultated.  Has 1+ lower extremity edema bilaterally slightly worse on the right compared to left Abdomen: Soft, non-tender, a little distended. Bowel sounds positive.  GU: Deferred. Musculoskeletal: No clubbing / cyanosis of digits/nails. No joint deformity upper and lower extremities.  Skin: Has right leg extremity erythema which is improving and warmth is also improved.  Has a black eschar  on the posterior portion of her leg Neurologic: CN 2-12 grossly intact with no focal deficits. Romberg sign and cerebellar reflexes not assessed.  Psychiatric: Normal judgment and insight. Alert and oriented x 3.  A little anxious  Data Reviewed: I have personally reviewed following labs and imaging studies  CBC: Recent Labs  Lab 06/05/23 1006 06/06/23 0947 06/07/23 0543 06/08/23 0620  WBC 5.2 5.3 5.5 5.4  NEUTROABS 3.4 3.9 3.4 3.9  HGB 11.5* 12.1 11.6* 10.9*  HCT 35.6* 38.0 36.4 34.3*  MCV 101.4* 103.3* 101.7* 103.0*  PLT 211 235 237 209   Basic Metabolic Panel: Recent Labs  Lab 06/05/23 1006 06/06/23 0947 06/07/23 0543 06/08/23 0620  NA 135 136 134* 134*  K 4.3 3.8 4.5 4.8  CL 92* 93* 93* 93*  CO2 33* 31 30 33*  GLUCOSE 93 196* 93 102*  BUN 38* 43* 45* 37*  CREATININE 0.79 0.83 0.70 0.79  CALCIUM 9.9 9.9 9.8 9.5  MG  --  2.2 2.2 2.3  PHOS  --  4.3 4.7* 4.3   GFR: Estimated Creatinine Clearance: 57.5 mL/min (by C-G formula based on SCr of 0.79 mg/dL). Liver Function Tests: Recent Labs  Lab 06/05/23 1006 06/06/23 0947 06/07/23 0543 06/08/23 0620  AST 21 25 24 27   ALT 21 21 22 22   ALKPHOS 54 56 52 49  BILITOT 0.8 0.7 0.6 0.7  PROT 7.4 7.5 7.3 6.8  ALBUMIN 3.8 3.7 3.6 3.5   No results for input(s): "LIPASE", "AMYLASE" in the last 168 hours. No results for input(s): "AMMONIA" in the last 168 hours. Coagulation Profile: No results for input(s): "INR", "PROTIME" in the last 168 hours. Cardiac Enzymes: No results for input(s): "CKTOTAL", "CKMB", "CKMBINDEX", "TROPONINI" in the last 168 hours. BNP (last 3 results) No results for input(s): "PROBNP" in the last 8760 hours. HbA1C: No results for input(s): "HGBA1C" in the last 72 hours. CBG: Recent Labs  Lab 06/06/23 0059  GLUCAP 149*   Lipid Profile: No results for input(s): "CHOL", "HDL", "LDLCALC", "TRIG", "CHOLHDL", "LDLDIRECT" in the last 72 hours. Thyroid Function Tests: No results for input(s):  "TSH", "T4TOTAL", "FREET4", "T3FREE", "THYROIDAB" in the last 72 hours. Anemia Panel: Recent Labs    06/08/23 0620  VITAMINB12 666  FOLATE 18.3  FERRITIN 130  TIBC 368  IRON 97  RETICCTPCT 1.5   Sepsis Labs: No results for input(s): "PROCALCITON", "LATICACIDVEN" in the last 168 hours.  No results found for this or any previous visit (from the past 240 hour(s)).   Radiology Studies: VAS [place] ABI WITH/WO TBI  Result Date: 06/08/2023  LOWER EXTREMITY DOPPLER STUDY Patient Name:  Marie Johnson  Date of Exam:   06/07/2023 Medical Rec #: [id number]       Accession #:    [id number] Date of Birth: [birth year]       Patient Gender: F Patient Age:   72 years Exam Location:  PROGRESS NOTE    ATHANASIA KRIETE  ZOX:[id number] DOB: [birth year] DOA: 06/05/2023 PCP: Ailene Ravel, MD   Brief Narrative:  The patient is a 72 year old Caucasian female with past medical history significant for abdominal AAA, anemia, anxiety, aortic atherosclerosis, osteoarthritis, asthma, nonobstructive CAD, chronic diastolic CHF, carotid artery disease, congenital heart disease, COPD, GERD, hyperlipidemia, hypertension, osteoporosis, PAD, history of bilateral femoral-popliteal bypass, history of CABG, history of prediabetes, history of PVC, PUD, peripheral vascular disease as well as tobacco abuse and laryngeal cancer who presented to the emergency department with complaints of progressively worsening edema, erythema, calor and tender right lower extremity after she bumped her leg several days ago.  She is found to have a cellulitis and currently being treated.  Imaging showed the right tibia and fibula was done and showed no acute abnormality.  She is admitted for acute cellulitis and was given IV ceftriaxone, fentanyl, oxycodone and acetaminophen in the ED.   Cellulitis is slowly improving and will need further pain control. Anticipating D/C in the next 24 hours as she continues slowly improved.  She is now feeling constipated so initiate bowel regimen and continue monitor her pain.  Assessment and Plan:  Cellulitis of right lower extremity, improving  -Admit to MedSurg/inpatient. -DG Tibia Fibula showed "Osteopenia. No fracture or dislocation. No bony erosive changes. Vascular calcifications. No definite soft tissue gas by x-ray. Of note on the frontal view the tip of the medial malleolus is not included in the imaging field. Further workup as clinically appropriate." -Continue ceftriaxone 1 g IVPB daily. -Analgesics as needed. -WBC Trend: Recent Labs  Lab 06/05/23 1006 06/06/23 0947 06/07/23 0543 06/08/23 0620  WBC 5.2 5.3 5.5 5.4  -WOC nurse consulted and recommending "Cleanse RLE  wounds (posterior calf) and LLE (pretibial) with saline, pat dry  Apply single layer of xeroform to the RLE wounds, top with foam  Apply small amount of Medihoney to the LLE open wound daily, cover with dry 2x2 and foam. Change both daily" -Will continue IV Ceftriaxone and transition to po in the AM but she still is having pain and so we will watch and overnight and add IV morphine 1 to 2 mg every 3 as needed for severe moderate pain -Anticipating transitioning to oral antibiotics at the time of discharge  Coronary artery disease involving native coronary artery of native heart -Continue on atorvastatin, ezetimibe, DAPT and verapamil. -No beta-blocker due to COPD. -Follow-up with cardiology as an outpatient.   COPD (Chronic Obstructive Pulmonary Disease) (HCC) -Continue Umeclidinium Bromide 1 puff IH Daily -Continue albuterol 2.5 mg nebs twice daily and 2.5 mg nebs every 6 as needed for wheezing or shortness of breath -May need to make further adjustments -Had some wheezing today so we will add flutter valve, incentive spirometry and guaifenesin   Hypertension -Continue Verapamil SR 240 mg p.o. daily, Spironolactone 25 mg p.o. daily, and Furosemide per home instructions. -Continue to Monitor BP per Protocol -Last BP Reading was 134/69   Chronic Diastolic CHF (congestive heart failure) (HCC) -No signs of decompensation. -Strict I's and O's and daily weights;  Intake/Output Summary (Last 24 hours) at 06/08/2023 1436 Last data filed at 06/08/2023 8119 Gross per 24 hour  Intake 600 ml  Output --  Net 600 ml  -Continue diuretics as above and given additional dose of Lasix IV 20 mg today  Constipation -Has not had a bowel movement in several days and so we will add senna docusate 1 tab p.o. twice daily, MiraLAX 17 g  PROGRESS NOTE    ATHANASIA KRIETE  ZOX:[id number] DOB: [birth year] DOA: 06/05/2023 PCP: Ailene Ravel, MD   Brief Narrative:  The patient is a 72 year old Caucasian female with past medical history significant for abdominal AAA, anemia, anxiety, aortic atherosclerosis, osteoarthritis, asthma, nonobstructive CAD, chronic diastolic CHF, carotid artery disease, congenital heart disease, COPD, GERD, hyperlipidemia, hypertension, osteoporosis, PAD, history of bilateral femoral-popliteal bypass, history of CABG, history of prediabetes, history of PVC, PUD, peripheral vascular disease as well as tobacco abuse and laryngeal cancer who presented to the emergency department with complaints of progressively worsening edema, erythema, calor and tender right lower extremity after she bumped her leg several days ago.  She is found to have a cellulitis and currently being treated.  Imaging showed the right tibia and fibula was done and showed no acute abnormality.  She is admitted for acute cellulitis and was given IV ceftriaxone, fentanyl, oxycodone and acetaminophen in the ED.   Cellulitis is slowly improving and will need further pain control. Anticipating D/C in the next 24 hours as she continues slowly improved.  She is now feeling constipated so initiate bowel regimen and continue monitor her pain.  Assessment and Plan:  Cellulitis of right lower extremity, improving  -Admit to MedSurg/inpatient. -DG Tibia Fibula showed "Osteopenia. No fracture or dislocation. No bony erosive changes. Vascular calcifications. No definite soft tissue gas by x-ray. Of note on the frontal view the tip of the medial malleolus is not included in the imaging field. Further workup as clinically appropriate." -Continue ceftriaxone 1 g IVPB daily. -Analgesics as needed. -WBC Trend: Recent Labs  Lab 06/05/23 1006 06/06/23 0947 06/07/23 0543 06/08/23 0620  WBC 5.2 5.3 5.5 5.4  -WOC nurse consulted and recommending "Cleanse RLE  wounds (posterior calf) and LLE (pretibial) with saline, pat dry  Apply single layer of xeroform to the RLE wounds, top with foam  Apply small amount of Medihoney to the LLE open wound daily, cover with dry 2x2 and foam. Change both daily" -Will continue IV Ceftriaxone and transition to po in the AM but she still is having pain and so we will watch and overnight and add IV morphine 1 to 2 mg every 3 as needed for severe moderate pain -Anticipating transitioning to oral antibiotics at the time of discharge  Coronary artery disease involving native coronary artery of native heart -Continue on atorvastatin, ezetimibe, DAPT and verapamil. -No beta-blocker due to COPD. -Follow-up with cardiology as an outpatient.   COPD (Chronic Obstructive Pulmonary Disease) (HCC) -Continue Umeclidinium Bromide 1 puff IH Daily -Continue albuterol 2.5 mg nebs twice daily and 2.5 mg nebs every 6 as needed for wheezing or shortness of breath -May need to make further adjustments -Had some wheezing today so we will add flutter valve, incentive spirometry and guaifenesin   Hypertension -Continue Verapamil SR 240 mg p.o. daily, Spironolactone 25 mg p.o. daily, and Furosemide per home instructions. -Continue to Monitor BP per Protocol -Last BP Reading was 134/69   Chronic Diastolic CHF (congestive heart failure) (HCC) -No signs of decompensation. -Strict I's and O's and daily weights;  Intake/Output Summary (Last 24 hours) at 06/08/2023 1436 Last data filed at 06/08/2023 8119 Gross per 24 hour  Intake 600 ml  Output --  Net 600 ml  -Continue diuretics as above and given additional dose of Lasix IV 20 mg today  Constipation -Has not had a bowel movement in several days and so we will add senna docusate 1 tab p.o. twice daily, MiraLAX 17 g  slight expiratory wheezing noted.  No appreciable rales, rhonchi or crackles. Normal respiratory effort and patient is not tachypenic. No accessory muscle use.  Wearing supplemental oxygen nasal cannula Cardiovascular: RRR, no murmurs / rubs / gallops. S1 and S2 auscultated.  Has 1+ lower extremity edema bilaterally slightly worse on the right compared to left Abdomen: Soft, non-tender, a little distended. Bowel sounds positive.  GU: Deferred. Musculoskeletal: No clubbing / cyanosis of digits/nails. No joint deformity upper and lower extremities.  Skin: Has right leg extremity erythema which is improving and warmth is also improved.  Has a black eschar  on the posterior portion of her leg Neurologic: CN 2-12 grossly intact with no focal deficits. Romberg sign and cerebellar reflexes not assessed.  Psychiatric: Normal judgment and insight. Alert and oriented x 3.  A little anxious  Data Reviewed: I have personally reviewed following labs and imaging studies  CBC: Recent Labs  Lab 06/05/23 1006 06/06/23 0947 06/07/23 0543 06/08/23 0620  WBC 5.2 5.3 5.5 5.4  NEUTROABS 3.4 3.9 3.4 3.9  HGB 11.5* 12.1 11.6* 10.9*  HCT 35.6* 38.0 36.4 34.3*  MCV 101.4* 103.3* 101.7* 103.0*  PLT 211 235 237 209   Basic Metabolic Panel: Recent Labs  Lab 06/05/23 1006 06/06/23 0947 06/07/23 0543 06/08/23 0620  NA 135 136 134* 134*  K 4.3 3.8 4.5 4.8  CL 92* 93* 93* 93*  CO2 33* 31 30 33*  GLUCOSE 93 196* 93 102*  BUN 38* 43* 45* 37*  CREATININE 0.79 0.83 0.70 0.79  CALCIUM 9.9 9.9 9.8 9.5  MG  --  2.2 2.2 2.3  PHOS  --  4.3 4.7* 4.3   GFR: Estimated Creatinine Clearance: 57.5 mL/min (by C-G formula based on SCr of 0.79 mg/dL). Liver Function Tests: Recent Labs  Lab 06/05/23 1006 06/06/23 0947 06/07/23 0543 06/08/23 0620  AST 21 25 24 27   ALT 21 21 22 22   ALKPHOS 54 56 52 49  BILITOT 0.8 0.7 0.6 0.7  PROT 7.4 7.5 7.3 6.8  ALBUMIN 3.8 3.7 3.6 3.5   No results for input(s): "LIPASE", "AMYLASE" in the last 168 hours. No results for input(s): "AMMONIA" in the last 168 hours. Coagulation Profile: No results for input(s): "INR", "PROTIME" in the last 168 hours. Cardiac Enzymes: No results for input(s): "CKTOTAL", "CKMB", "CKMBINDEX", "TROPONINI" in the last 168 hours. BNP (last 3 results) No results for input(s): "PROBNP" in the last 8760 hours. HbA1C: No results for input(s): "HGBA1C" in the last 72 hours. CBG: Recent Labs  Lab 06/06/23 0059  GLUCAP 149*   Lipid Profile: No results for input(s): "CHOL", "HDL", "LDLCALC", "TRIG", "CHOLHDL", "LDLDIRECT" in the last 72 hours. Thyroid Function Tests: No results for input(s):  "TSH", "T4TOTAL", "FREET4", "T3FREE", "THYROIDAB" in the last 72 hours. Anemia Panel: Recent Labs    06/08/23 0620  VITAMINB12 666  FOLATE 18.3  FERRITIN 130  TIBC 368  IRON 97  RETICCTPCT 1.5   Sepsis Labs: No results for input(s): "PROCALCITON", "LATICACIDVEN" in the last 168 hours.  No results found for this or any previous visit (from the past 240 hour(s)).   Radiology Studies: VAS [place] ABI WITH/WO TBI  Result Date: 06/08/2023  LOWER EXTREMITY DOPPLER STUDY Patient Name:  Marie Johnson  Date of Exam:   06/07/2023 Medical Rec #: [id number]       Accession #:    [id number] Date of Birth: [birth year]       Patient Gender: F Patient Age:   72 years Exam Location:  PROGRESS NOTE    ATHANASIA KRIETE  ZOX:[id number] DOB: [birth year] DOA: 06/05/2023 PCP: Ailene Ravel, MD   Brief Narrative:  The patient is a 72 year old Caucasian female with past medical history significant for abdominal AAA, anemia, anxiety, aortic atherosclerosis, osteoarthritis, asthma, nonobstructive CAD, chronic diastolic CHF, carotid artery disease, congenital heart disease, COPD, GERD, hyperlipidemia, hypertension, osteoporosis, PAD, history of bilateral femoral-popliteal bypass, history of CABG, history of prediabetes, history of PVC, PUD, peripheral vascular disease as well as tobacco abuse and laryngeal cancer who presented to the emergency department with complaints of progressively worsening edema, erythema, calor and tender right lower extremity after she bumped her leg several days ago.  She is found to have a cellulitis and currently being treated.  Imaging showed the right tibia and fibula was done and showed no acute abnormality.  She is admitted for acute cellulitis and was given IV ceftriaxone, fentanyl, oxycodone and acetaminophen in the ED.   Cellulitis is slowly improving and will need further pain control. Anticipating D/C in the next 24 hours as she continues slowly improved.  She is now feeling constipated so initiate bowel regimen and continue monitor her pain.  Assessment and Plan:  Cellulitis of right lower extremity, improving  -Admit to MedSurg/inpatient. -DG Tibia Fibula showed "Osteopenia. No fracture or dislocation. No bony erosive changes. Vascular calcifications. No definite soft tissue gas by x-ray. Of note on the frontal view the tip of the medial malleolus is not included in the imaging field. Further workup as clinically appropriate." -Continue ceftriaxone 1 g IVPB daily. -Analgesics as needed. -WBC Trend: Recent Labs  Lab 06/05/23 1006 06/06/23 0947 06/07/23 0543 06/08/23 0620  WBC 5.2 5.3 5.5 5.4  -WOC nurse consulted and recommending "Cleanse RLE  wounds (posterior calf) and LLE (pretibial) with saline, pat dry  Apply single layer of xeroform to the RLE wounds, top with foam  Apply small amount of Medihoney to the LLE open wound daily, cover with dry 2x2 and foam. Change both daily" -Will continue IV Ceftriaxone and transition to po in the AM but she still is having pain and so we will watch and overnight and add IV morphine 1 to 2 mg every 3 as needed for severe moderate pain -Anticipating transitioning to oral antibiotics at the time of discharge  Coronary artery disease involving native coronary artery of native heart -Continue on atorvastatin, ezetimibe, DAPT and verapamil. -No beta-blocker due to COPD. -Follow-up with cardiology as an outpatient.   COPD (Chronic Obstructive Pulmonary Disease) (HCC) -Continue Umeclidinium Bromide 1 puff IH Daily -Continue albuterol 2.5 mg nebs twice daily and 2.5 mg nebs every 6 as needed for wheezing or shortness of breath -May need to make further adjustments -Had some wheezing today so we will add flutter valve, incentive spirometry and guaifenesin   Hypertension -Continue Verapamil SR 240 mg p.o. daily, Spironolactone 25 mg p.o. daily, and Furosemide per home instructions. -Continue to Monitor BP per Protocol -Last BP Reading was 134/69   Chronic Diastolic CHF (congestive heart failure) (HCC) -No signs of decompensation. -Strict I's and O's and daily weights;  Intake/Output Summary (Last 24 hours) at 06/08/2023 1436 Last data filed at 06/08/2023 8119 Gross per 24 hour  Intake 600 ml  Output --  Net 600 ml  -Continue diuretics as above and given additional dose of Lasix IV 20 mg today  Constipation -Has not had a bowel movement in several days and so we will add senna docusate 1 tab p.o. twice daily, MiraLAX 17 g  slight expiratory wheezing noted.  No appreciable rales, rhonchi or crackles. Normal respiratory effort and patient is not tachypenic. No accessory muscle use.  Wearing supplemental oxygen nasal cannula Cardiovascular: RRR, no murmurs / rubs / gallops. S1 and S2 auscultated.  Has 1+ lower extremity edema bilaterally slightly worse on the right compared to left Abdomen: Soft, non-tender, a little distended. Bowel sounds positive.  GU: Deferred. Musculoskeletal: No clubbing / cyanosis of digits/nails. No joint deformity upper and lower extremities.  Skin: Has right leg extremity erythema which is improving and warmth is also improved.  Has a black eschar  on the posterior portion of her leg Neurologic: CN 2-12 grossly intact with no focal deficits. Romberg sign and cerebellar reflexes not assessed.  Psychiatric: Normal judgment and insight. Alert and oriented x 3.  A little anxious  Data Reviewed: I have personally reviewed following labs and imaging studies  CBC: Recent Labs  Lab 06/05/23 1006 06/06/23 0947 06/07/23 0543 06/08/23 0620  WBC 5.2 5.3 5.5 5.4  NEUTROABS 3.4 3.9 3.4 3.9  HGB 11.5* 12.1 11.6* 10.9*  HCT 35.6* 38.0 36.4 34.3*  MCV 101.4* 103.3* 101.7* 103.0*  PLT 211 235 237 209   Basic Metabolic Panel: Recent Labs  Lab 06/05/23 1006 06/06/23 0947 06/07/23 0543 06/08/23 0620  NA 135 136 134* 134*  K 4.3 3.8 4.5 4.8  CL 92* 93* 93* 93*  CO2 33* 31 30 33*  GLUCOSE 93 196* 93 102*  BUN 38* 43* 45* 37*  CREATININE 0.79 0.83 0.70 0.79  CALCIUM 9.9 9.9 9.8 9.5  MG  --  2.2 2.2 2.3  PHOS  --  4.3 4.7* 4.3   GFR: Estimated Creatinine Clearance: 57.5 mL/min (by C-G formula based on SCr of 0.79 mg/dL). Liver Function Tests: Recent Labs  Lab 06/05/23 1006 06/06/23 0947 06/07/23 0543 06/08/23 0620  AST 21 25 24 27   ALT 21 21 22 22   ALKPHOS 54 56 52 49  BILITOT 0.8 0.7 0.6 0.7  PROT 7.4 7.5 7.3 6.8  ALBUMIN 3.8 3.7 3.6 3.5   No results for input(s): "LIPASE", "AMYLASE" in the last 168 hours. No results for input(s): "AMMONIA" in the last 168 hours. Coagulation Profile: No results for input(s): "INR", "PROTIME" in the last 168 hours. Cardiac Enzymes: No results for input(s): "CKTOTAL", "CKMB", "CKMBINDEX", "TROPONINI" in the last 168 hours. BNP (last 3 results) No results for input(s): "PROBNP" in the last 8760 hours. HbA1C: No results for input(s): "HGBA1C" in the last 72 hours. CBG: Recent Labs  Lab 06/06/23 0059  GLUCAP 149*   Lipid Profile: No results for input(s): "CHOL", "HDL", "LDLCALC", "TRIG", "CHOLHDL", "LDLDIRECT" in the last 72 hours. Thyroid Function Tests: No results for input(s):  "TSH", "T4TOTAL", "FREET4", "T3FREE", "THYROIDAB" in the last 72 hours. Anemia Panel: Recent Labs    06/08/23 0620  VITAMINB12 666  FOLATE 18.3  FERRITIN 130  TIBC 368  IRON 97  RETICCTPCT 1.5   Sepsis Labs: No results for input(s): "PROCALCITON", "LATICACIDVEN" in the last 168 hours.  No results found for this or any previous visit (from the past 240 hour(s)).   Radiology Studies: VAS [place] ABI WITH/WO TBI  Result Date: 06/08/2023  LOWER EXTREMITY DOPPLER STUDY Patient Name:  Marie Johnson  Date of Exam:   06/07/2023 Medical Rec #: [id number]       Accession #:    [id number] Date of Birth: [birth year]       Patient Gender: F Patient Age:   72 years Exam Location:

## 2023-06-08 NOTE — Plan of Care (Signed)
  Problem: Education: Goal: Understanding of cardiac disease, CV risk reduction, and recovery process will improve Outcome: Progressing   Problem: Education: Goal: Understanding of CV disease, CV risk reduction, and recovery process will improve Outcome: Progressing   Problem: Health Behavior/Discharge Planning: Goal: Ability to safely manage health-related needs after discharge will improve Outcome: Progressing   Problem: Clinical Measurements: Goal: Ability to avoid or minimize complications of infection will improve Outcome: Progressing   Problem: Skin Integrity: Goal: Skin integrity will improve Outcome: Progressing   Problem: Education: Goal: Knowledge of General Education information will improve Description: Including pain rating scale, medication(s)/side effects and non-pharmacologic comfort measures Outcome: Progressing   Problem: Health Behavior/Discharge Planning: Goal: Ability to manage health-related needs will improve Outcome: Progressing   Problem: Clinical Measurements: Goal: Respiratory complications will improve Outcome: Progressing   Problem: Safety: Goal: Ability to remain free from injury will improve Outcome: Progressing

## 2023-06-09 ENCOUNTER — Inpatient Hospital Stay (HOSPITAL_COMMUNITY): Payer: Medicare Other

## 2023-06-09 DIAGNOSIS — L03115 Cellulitis of right lower limb: Secondary | ICD-10-CM | POA: Diagnosis not present

## 2023-06-09 DIAGNOSIS — I5032 Chronic diastolic (congestive) heart failure: Secondary | ICD-10-CM | POA: Diagnosis not present

## 2023-06-09 DIAGNOSIS — J42 Unspecified chronic bronchitis: Secondary | ICD-10-CM | POA: Diagnosis not present

## 2023-06-09 DIAGNOSIS — I2510 Atherosclerotic heart disease of native coronary artery without angina pectoris: Secondary | ICD-10-CM | POA: Diagnosis not present

## 2023-06-09 LAB — COMPREHENSIVE METABOLIC PANEL
ALT: 25 U/L (ref 0–44)
AST: 26 U/L (ref 15–41)
Albumin: 3.6 g/dL (ref 3.5–5.0)
Alkaline Phosphatase: 51 U/L (ref 38–126)
Anion gap: 9 (ref 5–15)
BUN: 33 mg/dL — ABNORMAL HIGH (ref 8–23)
CO2: 31 mmol/L (ref 22–32)
Calcium: 9.6 mg/dL (ref 8.9–10.3)
Chloride: 93 mmol/L — ABNORMAL LOW (ref 98–111)
Creatinine, Ser: 0.68 mg/dL (ref 0.44–1.00)
GFR, Estimated: >60 mL/min (ref >60)
Glucose, Bld: 135 mg/dL — ABNORMAL HIGH (ref 70–99)
Potassium: 4.6 mmol/L (ref 3.5–5.1)
Sodium: 133 mmol/L — ABNORMAL LOW (ref 135–145)
Total Bilirubin: 0.9 mg/dL (ref 0.3–1.2)
Total Protein: 7.1 g/dL (ref 6.5–8.1)

## 2023-06-09 LAB — CBC WITH DIFFERENTIAL/PLATELET
Abs Immature Granulocytes: 0.07 10*3/uL (ref 0.00–0.07)
Basophils Absolute: 0 10*3/uL (ref 0.0–0.1)
Basophils Relative: 0 %
Eosinophils Absolute: 0.3 10*3/uL (ref 0.0–0.5)
Eosinophils Relative: 5 %
HCT: 35.4 % — ABNORMAL LOW (ref 36.0–46.0)
Hemoglobin: 11.5 g/dL — ABNORMAL LOW (ref 12.0–15.0)
Immature Granulocytes: 1 %
Lymphocytes Relative: 10 %
Lymphs Abs: 0.7 10*3/uL (ref 0.7–4.0)
MCH: 33.4 pg (ref 26.0–34.0)
MCHC: 32.5 g/dL (ref 30.0–36.0)
MCV: 102.9 fL — ABNORMAL HIGH (ref 80.0–100.0)
Monocytes Absolute: 0.5 10*3/uL (ref 0.1–1.0)
Monocytes Relative: 8 %
Neutro Abs: 4.7 10*3/uL (ref 1.7–7.7)
Neutrophils Relative %: 76 %
Platelets: 216 10*3/uL (ref 150–400)
RBC: 3.44 MIL/uL — ABNORMAL LOW (ref 3.87–5.11)
RDW: 20.5 % — ABNORMAL HIGH (ref 11.5–15.5)
WBC: 6.3 10*3/uL (ref 4.0–10.5)
nRBC: 0.3 % — ABNORMAL HIGH (ref 0.0–0.2)

## 2023-06-09 LAB — PHOSPHORUS: Phosphorus: 4.1 mg/dL (ref 2.5–4.6)

## 2023-06-09 LAB — MAGNESIUM: Magnesium: 2.1 mg/dL (ref 1.7–2.4)

## 2023-06-09 NOTE — Plan of Care (Signed)
  Problem: Pain Managment: Goal: General experience of comfort will improve Outcome: Progressing   Problem: Safety: Goal: Ability to remain free from injury will improve Outcome: Progressing   

## 2023-06-09 NOTE — Progress Notes (Signed)
PROGRESS NOTE    Marie Johnson  WJX:[id number] DOB: 09/16/1951 DOA: 06/05/2023 PCP: Ailene Ravel, MD   Brief Narrative:  The patient is a 72 year old Caucasian female with past medical history significant for abdominal AAA, anemia, anxiety, aortic atherosclerosis, osteoarthritis, asthma, nonobstructive CAD, chronic diastolic CHF, carotid artery disease, congenital heart disease, COPD, GERD, hyperlipidemia, hypertension, osteoporosis, PAD, history of bilateral femoral-popliteal bypass, history of CABG, history of prediabetes, history of PVC, PUD, peripheral vascular disease as well as tobacco abuse and laryngeal cancer who presented to the emergency department with complaints of progressively worsening edema, erythema, calor and tender right lower extremity after she bumped her leg several days ago.  She is found to have a cellulitis and currently being treated.  Imaging showed the right tibia and fibula was done and showed no acute abnormality.  She is admitted for acute cellulitis and was given IV ceftriaxone, fentanyl, oxycodone and acetaminophen in the ED.   Cellulitis is slowly improving and will need further pain control. Anticipating D/C in the next 24 hours as she continues slowly improve but is now feeling constipated so initiate bowel regimen and continue monitor her pain.  Assessment and Plan:  Cellulitis of right lower extremity, improving  -Admit to MedSurg/inpatient. -DG Tibia Fibula showed "Osteopenia. No fracture or dislocation. No bony erosive changes. Vascular calcifications. No definite soft tissue gas by x-ray. Of note on the frontal view the tip of the medial malleolus is not included in the imaging field. Further workup as clinically appropriate." -Continue ceftriaxone 1 g IVPB daily. -Analgesics as needed and added IV Morphine -WBC Trend: Recent Labs  Lab 06/05/23 1006 06/06/23 0947 06/07/23 0543 06/08/23 0620 06/09/23 0616  WBC 5.2 5.3 5.5 5.4 6.3  -WOC nurse  consulted and recommending "Cleanse RLE wounds (posterior calf) and LLE (pretibial) with saline, pat dry  Apply single layer of xeroform to the RLE wounds, top with foam  Apply small amount of Medihoney to the LLE open wound daily, cover with dry 2x2 and foam. Change both daily" -Will continue IV Ceftriaxone and transition to po in the AM but she still is having quite a bit of pain and so we will watch her overnight again and continue IV morphine 1 to 2 mg every 3 as needed for severe moderate pain -Anticipating transitioning to oral antibiotics at the time of discharge and in the next 24 hours  Coronary Artery disease involving native coronary artery of native heart -Continue on atorvastatin, ezetimibe, DAPT and verapamil. -No beta-blocker due to COPD. -Follow-up with cardiology as an outpatient.   COPD (Chronic Obstructive Pulmonary Disease) (HCC) -Continue Umeclidinium Bromide 1 puff IH Daily -Continue albuterol 2.5 mg nebs twice daily and 2.5 mg nebs every 6 as needed for wheezing or shortness of breath -May need to make further adjustments -Had some wheezing yesterday so we will add flutter valve, incentive spirometry and Guaifenesin 1200 mg po BID -CXR done and showed "Stable cardiomediastinal contours. Aortic atherosclerosis. Minimal bibasilar scarring or atelectasis. No pleural effusion or pneumothorax."   Hypertension -Continue Verapamil SR 240 mg p.o. daily, Spironolactone 25 mg p.o. daily, and Furosemide per home instructions. -Continue to Monitor BP per Protocol -Last BP Reading was 141/65   Chronic Diastolic CHF (congestive heart failure) (HCC) -No signs of decompensation. -Strict I's and O's and daily weights; No intake or output data in the 24 hours ending 06/09/23 1719 -Continue diuretics as above and given additional dose of Lasix IV 20 mg today  Constipation -Has not had  a bowel movement in several days and so we will add senna docusate 1 tab p.o. twice daily, MiraLAX  17 g p.o. daily and bisacodyl 10 mg RC daily as needed for moderate constipation -KUB done and showed "Nonobstructed gas pattern with moderate stool in the colon. Right upper quadrant lucency, suspect related to high-riding colon. Bilateral hip replacements. Extensive aortoiliac vascular calcification with l iliac stents." -Will repeat in the AM with Decubitus View as well   PAD (peripheral artery disease) (HCC) -Continue antihyperlipidemics and DAPT as above. -Follow-up with vascular surgery as an outpatient but will check ABI here -ABI done and showed "Summary:  Right: Resting right ankle-brachial index indicates moderate right lower  extremity arterial disease. The right toe-brachial index is abnormal.   Left: Resting left ankle-brachial index indicates moderate left lower  extremity arterial disease. The left toe-brachial index is abnormal. " -Continue to monitor  Macrocytic Anemia -Hgb/Hct Trend: Recent Labs  Lab 06/05/23 1006 06/06/23 0947 06/07/23 0543 06/08/23 0620 06/09/23 0616  HGB 11.5* 12.1 11.6* 10.9* 11.5*  HCT 35.6* 38.0 36.4 34.3* 35.4*  MCV 101.4* 103.3* 101.7* 103.0* 102.9*  -Checked Anemia Panel and showed an iron level of 97, UIBC of 271, TIBC 368, saturation ratios of 26%, ferritin level 130, folate level of 18.3, vitamin B12 666 -Continue to Monitor for S/Sx of Bleeding; no overt bleeding noted -Repeat CBC in the AM   Tobacco Abuse -In remission.   Aortic atherosclerosis (HCC) Hyperlipidemia -Continue Atorvastatin 80 mg p.o. daily and Ezetimibe 10 mg p.o. daily. -On DAPT as above   GERD (Gastroesophageal Reflux Disease)/GI Prophylaxis -Continue PPI with Pantoprazole 40 mg p.o. twice daily. -Continue calcium carbonate as needed.   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) -Scheduled for radiation therapy during the week days -Continue per Rad Onc   DVT prophylaxis: enoxaparin (LOVENOX) injection 40 mg Start: 06/05/23 2200    Code  Status: Full Code Family Communication: No family present at bedside  Disposition Plan:  Level of care: Med-Surg Status is: Inpatient Remains inpatient appropriate because: Needs further clinical improvement and have pain controlled   Consultants:  None  Procedures:  As delineated as above  Antimicrobials:  Anti-infectives (From admission, onward)    Start     Dose/Rate Route Frequency Ordered Stop   06/06/23 1000  cefTRIAXone (ROCEPHIN) 1 g in sodium chloride 0.9 % 100 mL IVPB        1 g 200 mL/hr over 30 Minutes Intravenous Every 24 hours 06/05/23 1223 06/13/23 0959   06/05/23 1015  cefTRIAXone (ROCEPHIN) 2 g in sodium chloride 0.9 % 100 mL IVPB        2 g 200 mL/hr over 30 Minutes Intravenous  Once 06/05/23 1013 06/05/23 1113       Subjective: Seen and examined at bedside still not had a bowel movement but passing flatus.  Continues to have significant pain in her leg but thinks is slowly improving.  Leg is less warm and not as erythematous.  Cellulitis is much improved.  Continues to have some shortness of breath.  No other concerns or complaints at this time.  Objective: Vitals:   06/09/23 0554 06/09/23 0844 06/09/23 1230 06/09/23 1456  BP: (!) 124/94  131/69 (!) 141/65  Pulse: 82  86 66  Resp: 18  (!) 22 (!) 22  Temp: 97.7 F (36.5 C)  98.2 F (36.8 C) 98 F (36.7 C)  TempSrc: Oral  Oral Oral  SpO2: 96% 97% 98% 98%  Weight:  Height:       No intake or output data in the 24 hours ending 06/09/23 1734 Filed Weights   06/05/23 0933  Weight: 59 kg   Examination: Physical Exam:  Constitutional: Thin chronically ill-appearing older appearing than her stated age Caucasian female who is anxious and uncomfortable Respiratory: Diminished to auscultation bilaterally with coarse breath sounds, no wheezing, rales, rhonchi or crackles. Normal respiratory effort and patient is not tachypenic. No accessory muscle use.  Cardiovascular: RRR, no murmurs / rubs / gallops.  S1 and S2 auscultated.  Mild 1+ extremity edema worse on the right compared to left  Abdomen: Soft, non-tender, distended secondary body habitus. Bowel sounds positive.  GU: Deferred. Musculoskeletal: No clubbing / cyanosis of digits/nails. No joint deformity upper and lower extremities.  Skin: No rashes, lesions, ulcers limited skin evaluation. No induration; Warm and dry.  Neurologic: CN 2-12 grossly intact with no focal deficits. Romberg sign and cerebellar reflexes not assessed.  Psychiatric: Normal judgment and insight. Alert and oriented x 3.  Anxious mood  Data Reviewed: I have personally reviewed following labs and imaging studies  CBC: Recent Labs  Lab 06/05/23 1006 06/06/23 0947 06/07/23 0543 06/08/23 0620 06/09/23 0616  WBC 5.2 5.3 5.5 5.4 6.3  NEUTROABS 3.4 3.9 3.4 3.9 4.7  HGB 11.5* 12.1 11.6* 10.9* 11.5*  HCT 35.6* 38.0 36.4 34.3* 35.4*  MCV 101.4* 103.3* 101.7* 103.0* 102.9*  PLT 211 235 237 209 216   Basic Metabolic Panel: Recent Labs  Lab 06/05/23 1006 06/06/23 0947 06/07/23 0543 06/08/23 0620 06/09/23 0616  NA 135 136 134* 134* 133*  K 4.3 3.8 4.5 4.8 4.6  CL 92* 93* 93* 93* 93*  CO2 33* 31 30 33* 31  GLUCOSE 93 196* 93 102* 135*  BUN 38* 43* 45* 37* 33*  CREATININE 0.79 0.83 0.70 0.79 0.68  CALCIUM 9.9 9.9 9.8 9.5 9.6  MG  --  2.2 2.2 2.3 2.1  PHOS  --  4.3 4.7* 4.3 4.1   GFR: Estimated Creatinine Clearance: 57.5 mL/min (by C-G formula based on SCr of 0.68 mg/dL). Liver Function Tests: Recent Labs  Lab 06/05/23 1006 06/06/23 0947 06/07/23 0543 06/08/23 0620 06/09/23 0616  AST 21 25 24 27 26   ALT 21 21 22 22 25   ALKPHOS 54 56 52 49 51  BILITOT 0.8 0.7 0.6 0.7 0.9  PROT 7.4 7.5 7.3 6.8 7.1  ALBUMIN 3.8 3.7 3.6 3.5 3.6   No results for input(s): "LIPASE", "AMYLASE" in the last 168 hours. No results for input(s): "AMMONIA" in the last 168 hours. Coagulation Profile: No results for input(s): "INR", "PROTIME" in the last 168 hours. Cardiac  Enzymes: No results for input(s): "CKTOTAL", "CKMB", "CKMBINDEX", "TROPONINI" in the last 168 hours. BNP (last 3 results) No results for input(s): "PROBNP" in the last 8760 hours. HbA1C: No results for input(s): "HGBA1C" in the last 72 hours. CBG: Recent Labs  Lab 06/06/23 0059  GLUCAP 149*   Lipid Profile: No results for input(s): "CHOL", "HDL", "LDLCALC", "TRIG", "CHOLHDL", "LDLDIRECT" in the last 72 hours. Thyroid Function Tests: No results for input(s): "TSH", "T4TOTAL", "FREET4", "T3FREE", "THYROIDAB" in the last 72 hours. Anemia Panel: Recent Labs    06/08/23 0620  VITAMINB12 666  FOLATE 18.3  FERRITIN 130  TIBC 368  IRON 97  RETICCTPCT 1.5   Sepsis Labs: No results for input(s): "PROCALCITON", "LATICACIDVEN" in the last 168 hours.  No results found for this or any previous visit (from the past 240 hour(s)).   Radiology  Studies: DG Abd 1 View  Result Date: 06/09/2023 CLINICAL DATA:  Pain and distension EXAM: ABDOMEN - 1 VIEW COMPARISON:  PET CT 04/26/2023 FINDINGS: Nonobstructed gas pattern with moderate stool in the colon. Right upper quadrant lucency, suspect related to high-riding colon. Bilateral hip replacements. Extensive aortoiliac vascular calcification with l iliac stents. IMPRESSION: Nonobstructed gas pattern with moderate stool in the colon. Lucency overlying the liver, favored to be related to high-riding colon, but suggest upright or decubitus view These results will be called to the ordering clinician or representative by the Radiologist Assistant, and communication documented in the PACS or [employer]. Electronically Signed   By: Jasmine Pang M.D.   On: 06/09/2023 16:31   DG CHEST PORT 1 VIEW  Result Date: 06/08/2023 CLINICAL DATA:  Shortness of breath EXAM: PORTABLE CHEST 1 VIEW COMPARISON:  04/06/2023 FINDINGS: Stable cardiomediastinal contours. Aortic atherosclerosis. Minimal bibasilar scarring or atelectasis. No pleural effusion or pneumothorax.  IMPRESSION: Minimal bibasilar scarring or atelectasis. Electronically Signed   By: Duanne Guess D.O.   On: 06/08/2023 19:53    Scheduled Meds:  albuterol  2.5 mg Nebulization BID   allopurinol  300 mg Oral QPM   aspirin EC  81 mg Oral Daily   atorvastatin  80 mg Oral QHS   clopidogrel  75 mg Oral QHS   enoxaparin (LOVENOX) injection  40 mg Subcutaneous Q24H   ezetimibe  10 mg Oral QHS   feeding supplement  237 mL Oral BID BM   furosemide  40 mg Oral 2 times per day on Monday Wednesday Friday   And   furosemide  40 mg Oral Once per day on Sunday Tuesday Thursday Saturday   guaiFENesin  1,200 mg Oral BID   influenza vaccine adjuvanted  0.5 mL Intramuscular Tomorrow-1000   leptospermum manuka honey  1 Application Topical Daily   melatonin  5 mg Oral QHS   pantoprazole  40 mg Oral BID   polyethylene glycol  17 g Oral BID   potassium chloride  40 mEq Oral Once per day on Monday Wednesday Friday   And   potassium chloride  20 mEq Oral Once per day on Sunday Tuesday Thursday Saturday   senna-docusate  1 tablet Oral BID   spironolactone  25 mg Oral Daily   umeclidinium bromide  1 puff Inhalation Daily   verapamil  240 mg Oral Daily   Continuous Infusions:  cefTRIAXone (ROCEPHIN)  IV 1 g (06/09/23 0932)    LOS: 4 days   Marguerita Merles, DO [facility] Available via Epic secure chat 7am-7pm After these hours, please refer to coverage provider listed on [email or web address] 06/09/2023, 5:34 PM

## 2023-06-09 NOTE — Plan of Care (Signed)
  Problem: Clinical Measurements: Goal: Ability to avoid or minimize complications of infection will improve Outcome: Progressing   Problem: Skin Integrity: Goal: Skin integrity will improve Outcome: Progressing   Problem: Education: Goal: Knowledge of General Education information will improve Description: Including pain rating scale, medication(s)/side effects and non-pharmacologic comfort measures Outcome: Progressing   Problem: Health Behavior/Discharge Planning: Goal: Ability to manage health-related needs will improve Outcome: Progressing   Problem: Clinical Measurements: Goal: Ability to maintain clinical measurements within normal limits will improve Outcome: Progressing   Problem: Pain Managment: Goal: General experience of comfort will improve Outcome: Progressing   Problem: Activity: Goal: Risk for activity intolerance will decrease Outcome: Not Progressing   Problem: Elimination: Goal: Will not experience complications related to bowel motility Outcome: Not Progressing Note: Pt states she has not had a BM in 5 days. MD aware. RN medicating pt and encouraging fluids and ambulation. Pt is passing gas and states she's had a liquid BM but that she still feels constipated.

## 2023-06-10 ENCOUNTER — Inpatient Hospital Stay (HOSPITAL_COMMUNITY): Payer: Medicare Other

## 2023-06-10 ENCOUNTER — Ambulatory Visit: Admission: RE | Admit: 2023-06-10 | Payer: Medicare Other | Source: Ambulatory Visit

## 2023-06-10 ENCOUNTER — Ambulatory Visit: Payer: Medicare Other

## 2023-06-10 DIAGNOSIS — J42 Unspecified chronic bronchitis: Secondary | ICD-10-CM | POA: Diagnosis not present

## 2023-06-10 DIAGNOSIS — I2510 Atherosclerotic heart disease of native coronary artery without angina pectoris: Secondary | ICD-10-CM | POA: Diagnosis not present

## 2023-06-10 DIAGNOSIS — I5032 Chronic diastolic (congestive) heart failure: Secondary | ICD-10-CM | POA: Diagnosis not present

## 2023-06-10 DIAGNOSIS — L03115 Cellulitis of right lower limb: Secondary | ICD-10-CM | POA: Diagnosis not present

## 2023-06-10 DIAGNOSIS — K5900 Constipation, unspecified: Secondary | ICD-10-CM

## 2023-06-10 LAB — COMPREHENSIVE METABOLIC PANEL
ALT: 25 U/L (ref 0–44)
AST: 23 U/L (ref 15–41)
Albumin: 3.3 g/dL — ABNORMAL LOW (ref 3.5–5.0)
Alkaline Phosphatase: 51 U/L (ref 38–126)
Anion gap: 9 (ref 5–15)
BUN: 31 mg/dL — ABNORMAL HIGH (ref 8–23)
CO2: 28 mmol/L (ref 22–32)
Calcium: 9.1 mg/dL (ref 8.9–10.3)
Chloride: 95 mmol/L — ABNORMAL LOW (ref 98–111)
Creatinine, Ser: 0.59 mg/dL (ref 0.44–1.00)
GFR, Estimated: >60 mL/min (ref >60)
Glucose, Bld: 93 mg/dL (ref 70–99)
Potassium: 4.2 mmol/L (ref 3.5–5.1)
Sodium: 132 mmol/L — ABNORMAL LOW (ref 135–145)
Total Bilirubin: 0.8 mg/dL (ref 0.3–1.2)
Total Protein: 6.6 g/dL (ref 6.5–8.1)

## 2023-06-10 LAB — CBC WITH DIFFERENTIAL/PLATELET
Abs Immature Granulocytes: 0.07 10*3/uL (ref 0.00–0.07)
Basophils Absolute: 0 10*3/uL (ref 0.0–0.1)
Basophils Relative: 1 %
Eosinophils Absolute: 0.4 10*3/uL (ref 0.0–0.5)
Eosinophils Relative: 7 %
HCT: 34.7 % — ABNORMAL LOW (ref 36.0–46.0)
Hemoglobin: 11.1 g/dL — ABNORMAL LOW (ref 12.0–15.0)
Immature Granulocytes: 1 %
Lymphocytes Relative: 13 %
Lymphs Abs: 0.7 10*3/uL (ref 0.7–4.0)
MCH: 32.7 pg (ref 26.0–34.0)
MCHC: 32 g/dL (ref 30.0–36.0)
MCV: 102.4 fL — ABNORMAL HIGH (ref 80.0–100.0)
Monocytes Absolute: 0.7 10*3/uL (ref 0.1–1.0)
Monocytes Relative: 13 %
Neutro Abs: 3.3 10*3/uL (ref 1.7–7.7)
Neutrophils Relative %: 65 %
Platelets: 194 10*3/uL (ref 150–400)
RBC: 3.39 MIL/uL — ABNORMAL LOW (ref 3.87–5.11)
RDW: 20.4 % — ABNORMAL HIGH (ref 11.5–15.5)
WBC: 5.1 10*3/uL (ref 4.0–10.5)
nRBC: 0.4 % — ABNORMAL HIGH (ref 0.0–0.2)

## 2023-06-10 LAB — MAGNESIUM: Magnesium: 2.2 mg/dL (ref 1.7–2.4)

## 2023-06-10 LAB — PHOSPHORUS: Phosphorus: 4 mg/dL (ref 2.5–4.6)

## 2023-06-10 MED ORDER — SORBITOL 70 % SOLN
960.0000 mL | TOPICAL_OIL | Freq: Once | ORAL | Status: AC
  Administered 2023-06-10: 960 mL via RECTAL
  Filled 2023-06-10: qty 240

## 2023-06-10 NOTE — Progress Notes (Signed)
PROGRESS NOTE    Marie Johnson  ZOX:[id number] DOB: Jan 15, 1951 DOA: 06/05/2023 PCP: Ailene Ravel, MD   Brief Narrative:  The patient is a 72 year old Caucasian female with past medical history significant for abdominal AAA, anemia, anxiety, aortic atherosclerosis, osteoarthritis, asthma, nonobstructive CAD, chronic diastolic CHF, carotid artery disease, congenital heart disease, COPD, GERD, hyperlipidemia, hypertension, osteoporosis, PAD, history of bilateral femoral-popliteal bypass, history of CABG, history of prediabetes, history of PVC, PUD, peripheral vascular disease as well as tobacco abuse and laryngeal cancer who presented to the emergency department with complaints of progressively worsening edema, erythema, calor and tender right lower extremity after she bumped her leg several days ago.  She is found to have a cellulitis and currently being treated.  Imaging showed the right tibia and fibula was done and showed no acute abnormality.  She is admitted for acute cellulitis and was given IV ceftriaxone, fentanyl, oxycodone and acetaminophen in the ED.   Cellulitis is slowly improving and will need further pain control. Anticipating D/C in the next 24 hours as she continues slowly improve but is now significantly constipated so initiated bowel regimen and continue monitor her pain. Given no Bowel Movement will intimate SMOG.  Assessment and Plan:  Cellulitis of Right Lower Extremity, improving  -Admit to MedSurg/inpatient. -DG Tibia Fibula showed "Osteopenia. No fracture or dislocation. No bony erosive changes. Vascular calcifications. No definite soft tissue gas by x-ray. Of note on the frontal view the tip of the medial malleolus is not included in the imaging field. Further workup as clinically appropriate." -Continue ceftriaxone 1 g IVPB daily. -Analgesics as needed and added IV Morphine -WBC Trend: Recent Labs  Lab 06/05/23 1006 06/06/23 0947 06/07/23 0543 06/08/23 0620  06/09/23 0616 06/10/23 0524  WBC 5.2 5.3 5.5 5.4 6.3 5.1  -WOC nurse consulted and recommending "Cleanse RLE wounds (posterior calf) and LLE (pretibial) with saline, pat dry  Apply single layer of xeroform to the RLE wounds, top with foam  Apply small amount of Medihoney to the LLE open wound daily, cover with dry 2x2 and foam. Change both daily" -Will continue IV Ceftriaxone and transition to po in the AM but she still is having quite a bit of pain and so we will watch her overnight again and continue IV morphine 1 to 2 mg every 3 as needed for severe moderate pain -Anticipating transitioning to oral antibiotics at the time of discharge; Still has not had a Bowel movement in over a week so will Be more aggressive with Regimen  Coronary Artery Disease involving native coronary artery of native heart -Continue on atorvastatin, ezetimibe, DAPT and verapamil. -No beta-blocker due to COPD. -Follow-up with Cardiology as an outpatient.   COPD (Chronic Obstructive Pulmonary Disease) (HCC) -Continue Umeclidinium Bromide 1 puff IH Daily -Continue albuterol 2.5 mg nebs twice daily and 2.5 mg nebs every 6 as needed for wheezing or shortness of breath -May need to make further adjustments -Had some wheezing yesterday so we will add flutter valve, incentive spirometry and Guaifenesin 1200 mg po BID -CXR done and showed "Stable cardiomediastinal contours. Aortic atherosclerosis. Minimal bibasilar scarring or atelectasis. No pleural effusion or pneumothorax."   Hypertension -Continue Verapamil SR 240 mg p.o. daily, Spironolactone 25 mg p.o. daily, and Furosemide per home instructions. -Continue to Monitor BP per Protocol -Last BP Reading was 157/77   Chronic Diastolic CHF (congestive heart failure) (HCC) -No signs of decompensation. -Strict I's and O's and daily weights;  Intake/Output Summary (Last 24 hours) at 06/10/2023 1916  Last data filed at 06/10/2023 0450 Gross per 24 hour  Intake 237 ml   Output --  Net 237 ml  -Continue diuretics as above and given additional dose of Lasix IV 20 mg yesterday  Constipation -Has not had a bowel movement in several days and so we will add senna docusate 1 tab p.o. twice daily, MiraLAX 17 g p.o. daily and bisacodyl 10 mg RC daily as needed for moderate constipation; Also has PRN Lactulose -KUB done and showed "Nonobstructed gas pattern with moderate stool in the colon. Right upper quadrant lucency, suspect related to high-riding colon. Bilateral hip replacements. Extensive aortoiliac vascular calcification with l iliac stents." -Will repeat in the AM with Decubitus View as well and this AM X-Ray Showed "Large amount of gas and fecal matter throughout the colon. Small bowel pattern is normal. Vascular calcification and stents as seen previously. Previous bilateral hip replacements. Volume loss at the lower lungs right more than left as seen previously."  -Have ordered SMOG   PAD (Peripheral Artery Disease) (HCC) -Continue antihyperlipidemics and DAPT as above. -Follow-up with vascular surgery as an outpatient but will check ABI here -ABI done and showed "Summary:  Right: Resting right ankle-brachial index indicates moderate right lower  extremity arterial disease. The right toe-brachial index is abnormal.   Left: Resting left ankle-brachial index indicates moderate left lower  extremity arterial disease. The left toe-brachial index is abnormal. " -Continue to monitor  Macrocytic Anemia -Hgb/Hct Trend: Recent Labs  Lab 06/05/23 1006 06/06/23 0947 06/07/23 0543 06/08/23 0620 06/09/23 0616 06/10/23 0524  HGB 11.5* 12.1 11.6* 10.9* 11.5* 11.1*  HCT 35.6* 38.0 36.4 34.3* 35.4* 34.7*  MCV 101.4* 103.3* 101.7* 103.0* 102.9* 102.4*  -Checked Anemia Panel and showed an iron level of 97, UIBC of 271, TIBC 368, saturation ratios of 26%, ferritin level 130, folate level of 18.3, vitamin B12 666 -Continue to Monitor for S/Sx of Bleeding; no overt  bleeding noted -Repeat CBC in the AM   Tobacco Abuse -In remission.   Aortic Atherosclerosis (HCC) Hyperlipidemia -Continue Atorvastatin 80 mg p.o. daily and Ezetimibe 10 mg p.o. daily. -On DAPT as above   GERD (Gastroesophageal Reflux Disease)/GI Prophylaxis -Continue PPI with Pantoprazole 40 mg p.o. twice daily. -Continue calcium carbonate as needed.   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  Leg erythema and swelling has improved significantly   Consultants:  None  Procedures:  As delineated as above  Antimicrobials:  Anti-infectives (From admission, onward)    Start     Dose/Rate Route Frequency Ordered Stop   06/06/23 1000  cefTRIAXone (ROCEPHIN) 1 g in sodium chloride 0.9 % 100 mL IVPB        1 g 200 mL/hr over 30 Minutes Intravenous Every 24 hours 06/05/23 1223 06/13/23 0959   06/05/23 1015  cefTRIAXone (ROCEPHIN) 2 g in sodium chloride 0.9 % 100 mL IVPB        2 g 200 mL/hr over 30 Minutes Intravenous  Once 06/05/23 1013 06/05/23 1113       Subjective: Seen and examined at bedside and still has not had a bowel movement  and feels significantly distended. Leg is Painful but not as erythematous and swollen. No CP or SOB. Wanting to ambulate. No other concerns or complaints at this time.   Objective: Vitals:   06/09/23 1941 06/09/23 2028 06/10/23 0447 06/10/23 1247  BP:  (!) 140/73 136/63 (!) 157/77  Pulse:  79 72 93  Resp:  20 20 14   Temp:  98 F (36.7 C) 98 F (36.7 C) 98 F (36.7 C)  TempSrc:  Oral Oral   SpO2: 98% 100%  96% 99%  Weight:      Height:        Intake/Output Summary (Last 24 hours) at 06/10/2023 1918 Last data filed at 06/10/2023 0450 Gross per 24 hour  Intake 237 ml  Output --  Net 237 ml   Filed Weights   06/05/23 0933  Weight: 59 kg   Examination: Physical Exam:  Constitutional: Thin Caucasian chronically ill-appearing older appearing than her stated age who appears anxious Respiratory: Clear to auscultation bilaterally, no wheezing, rales, rhonchi or crackles. Normal respiratory effort and patient is not tachypenic. No accessory muscle use. Wearing supplemental O2 via [place] Cardiovascular: RRR, no murmurs / rubs / gallops. S1 and S2 auscultated. 1+ LE Edema worse on the Right compared to the Left  Abdomen: Soft, non-tender, Distended 2/2 body habitus and hypertympanic to palpate. Has Diastasis Recti. Bowel sounds positive.  GU: Deferred. Musculoskeletal: No clubbing / cyanosis of digits/nails. No joint deformity upper and lower extremities.  Skin: No rashes, lesions, ulcers on a limited skin evaluation. No induration; Warm and dry.  Neurologic: CN 2-12 grossly intact with no focal deficits. Romberg sign and cerebellar reflexes not assessed.  Psychiatric: Normal judgment and insight. Alert and oriented x 3. Anxious Mood  Data Reviewed: I have personally reviewed following labs and imaging studies  CBC: Recent Labs  Lab 06/06/23 0947 06/07/23 0543 06/08/23 0620 06/09/23 0616 06/10/23 0524  WBC 5.3 5.5 5.4 6.3 5.1  NEUTROABS 3.9 3.4 3.9 4.7 3.3  HGB 12.1 11.6* 10.9* 11.5* 11.1*  HCT 38.0 36.4 34.3* 35.4* 34.7*  MCV 103.3* 101.7* 103.0* 102.9* 102.4*  PLT 235 237 209 216 194   Basic Metabolic Panel: Recent Labs  Lab 06/06/23 0947 06/07/23 0543 06/08/23 0620 06/09/23 0616 06/10/23 0524  NA 136 134* 134* 133* 132*  K 3.8 4.5 4.8 4.6 4.2  CL 93* 93* 93* 93* 95*  CO2 31 30 33* 31 28  GLUCOSE 196* 93 102* 135* 93  BUN 43* 45* 37* 33* 31*  CREATININE 0.83 0.70 0.79 0.68  0.59  CALCIUM 9.9 9.8 9.5 9.6 9.1  MG 2.2 2.2 2.3 2.1 2.2  PHOS 4.3 4.7* 4.3 4.1 4.0   GFR: Estimated Creatinine Clearance: 57.5 mL/min (by C-G formula based on SCr of 0.59 mg/dL). Liver Function Tests: Recent Labs  Lab 06/06/23 0947 06/07/23 0543 06/08/23 0620 06/09/23 0616 06/10/23 0524  AST 25 24 27 26 23   ALT 21 22 22 25 25   ALKPHOS 56 52 49 51 51  BILITOT 0.7 0.6 0.7 0.9 0.8  PROT 7.5 7.3 6.8 7.1 6.6  ALBUMIN 3.7 3.6 3.5 3.6 3.3*   No results for input(s): "LIPASE", "AMYLASE" in the last 168 hours. No results for input(s): "AMMONIA" in the last 168 hours. Coagulation Profile: No results for input(s): "INR", "PROTIME" in the last 168 hours. Cardiac Enzymes: No results for input(s): "CKTOTAL", "CKMB", "CKMBINDEX", "TROPONINI" in the last 168 hours. BNP (last 3 results) No results for input(s): "PROBNP" in the last 8760 hours. HbA1C:  No results for input(s): "HGBA1C" in the last 72 hours. CBG: Recent Labs  Lab 06/06/23 0059  GLUCAP 149*   Lipid Profile: No results for input(s): "CHOL", "HDL", "LDLCALC", "TRIG", "CHOLHDL", "LDLDIRECT" in the last 72 hours. Thyroid Function Tests: No results for input(s): "TSH", "T4TOTAL", "FREET4", "T3FREE", "THYROIDAB" in the last 72 hours. Anemia Panel: Recent Labs    06/08/23 0620  VITAMINB12 666  FOLATE 18.3  FERRITIN 130  TIBC 368  IRON 97  RETICCTPCT 1.5   Sepsis Labs: No results for input(s): "PROCALCITON", "LATICACIDVEN" in the last 168 hours.  No results found for this or any previous visit (from the past 240 hour(s)).   Radiology Studies: DG Abd 2 Views  Result Date: 06/10/2023 CLINICAL DATA:  Abdominal distension. Abdominal pain. No bowel movements. EXAM: ABDOMEN - 2 VIEW COMPARISON:  06/09/2023 FINDINGS: Large amount of gas and fecal matter throughout the colon. Small bowel pattern is normal. Vascular calcification and stents as seen previously. Previous bilateral hip replacements. Volume loss at the lower  lungs right more than left as seen previously. IMPRESSION: Large amount of gas and fecal matter throughout the colon. No sign of small-bowel obstruction. Electronically Signed   By: Paulina Fusi M.D.   On: 06/10/2023 07:35   DG Abd 1 View  Result Date: 06/09/2023 CLINICAL DATA:  Pain and distension EXAM: ABDOMEN - 1 VIEW COMPARISON:  PET CT 04/26/2023 FINDINGS: Nonobstructed gas pattern with moderate stool in the colon. Right upper quadrant lucency, suspect related to high-riding colon. Bilateral hip replacements. Extensive aortoiliac vascular calcification with l iliac stents. IMPRESSION: Nonobstructed gas pattern with moderate stool in the colon. Lucency overlying the liver, favored to be related to high-riding colon, but suggest upright or decubitus view These results will be called to the ordering clinician or representative by the Radiologist Assistant, and communication documented in the PACS or [employer]. Electronically Signed   By: Jasmine Pang M.D.   On: 06/09/2023 16:31    Scheduled Meds:  albuterol  2.5 mg Nebulization BID   allopurinol  300 mg Oral QPM   aspirin EC  81 mg Oral Daily   atorvastatin  80 mg Oral QHS   clopidogrel  75 mg Oral QHS   enoxaparin (LOVENOX) injection  40 mg Subcutaneous Q24H   ezetimibe  10 mg Oral QHS   feeding supplement  237 mL Oral BID BM   furosemide  40 mg Oral 2 times per day on Monday Wednesday Friday   And   furosemide  40 mg Oral Once per day on Sunday Tuesday Thursday Saturday   guaiFENesin  1,200 mg Oral BID   influenza vaccine adjuvanted  0.5 mL Intramuscular Tomorrow-1000   leptospermum manuka honey  1 Application Topical Daily   melatonin  5 mg Oral QHS   pantoprazole  40 mg Oral BID   polyethylene glycol  17 g Oral BID   potassium chloride  40 mEq Oral Once per day on Monday Wednesday Friday   And   potassium chloride  20 mEq Oral Once per day on Sunday Tuesday Thursday Saturday   senna-docusate  1 tablet Oral BID    spironolactone  25 mg Oral Daily   umeclidinium bromide  1 puff Inhalation Daily   verapamil  240 mg Oral Daily   Continuous Infusions:  cefTRIAXone (ROCEPHIN)  IV 1 g (06/10/23 0936)    LOS: 5 days   Marguerita Merles, DO [facility] Available via Epic secure chat 7am-7pm After these hours, please refer to  coverage provider listed on [email or web address] 06/10/2023, 7:18 PM

## 2023-06-10 NOTE — Progress Notes (Signed)
Mobility Specialist - Progress Note   06/10/23 1336  Mobility  Activity Ambulated with assistance in hallway  Level of Assistance Standby assist, set-up cues, supervision of patient - no hands on  Assistive Device Front wheel walker  Distance Ambulated (ft) 200 ft  Range of Motion/Exercises Active  Activity Response Tolerated well  Mobility Referral Yes  $Mobility charge 1 Mobility  Mobility Specialist Start Time (ACUTE ONLY) 1320  Mobility Specialist Stop Time (ACUTE ONLY) 1332  Mobility Specialist Time Calculation (min) (ACUTE ONLY) 12 min   Pt received in bed and agreed to mobility. Had no issues throughout session. Returned to restroom with all needs met.  Marilynne Halsted Mobility Specialist

## 2023-06-10 NOTE — Plan of Care (Signed)
  Problem: Activity: Goal: Ability to tolerate increased activity will improve Outcome: Progressing   Problem: Activity: Goal: Ability to return to baseline activity level will improve Outcome: Progressing   Problem: Safety: Goal: Ability to remain free from injury will improve Outcome: Progressing

## 2023-06-10 NOTE — Plan of Care (Signed)
  Problem: Activity: Goal: Ability to return to baseline activity level will improve Outcome: Progressing   Problem: Cardiovascular: Goal: Ability to achieve and maintain adequate cardiovascular perfusion will improve Outcome: Progressing Goal: Vascular access site(s) Level 0-1 will be maintained Outcome: Progressing   Problem: Health Behavior/Discharge Planning: Goal: Ability to safely manage health-related needs after discharge will improve Outcome: Progressing   Problem: Clinical Measurements: Goal: Ability to avoid or minimize complications of infection will improve Outcome: Progressing   Problem: Skin Integrity: Goal: Skin integrity will improve Outcome: Progressing   Problem: Education: Goal: Knowledge of General Education information will improve Description: Including pain rating scale, medication(s)/side effects and non-pharmacologic comfort measures Outcome: Progressing   Problem: Health Behavior/Discharge Planning: Goal: Ability to manage health-related needs will improve Outcome: Progressing   Problem: Clinical Measurements: Goal: Ability to maintain clinical measurements within normal limits will improve Outcome: Progressing Goal: Diagnostic test results will improve Outcome: Progressing Goal: Respiratory complications will improve Outcome: Progressing   Problem: Activity: Goal: Risk for activity intolerance will decrease Outcome: Progressing   Problem: Pain Managment: Goal: General experience of comfort will improve Outcome: Progressing   Problem: Elimination: Goal: Will not experience complications related to bowel motility Outcome: Not Progressing Note: Pt has not had a BM in over 5 days. Imaging showed stool buildup in colon. PRN lactulose given with no luck. Enema to be given after pt finishes dinner, per her request. MD will be notified of results after enema is given.

## 2023-06-11 ENCOUNTER — Ambulatory Visit (HOSPITAL_COMMUNITY): Payer: Medicare Other

## 2023-06-11 ENCOUNTER — Other Ambulatory Visit: Payer: Self-pay

## 2023-06-11 ENCOUNTER — Ambulatory Visit
Admission: RE | Admit: 2023-06-11 | Discharge: 2023-06-11 | Disposition: A | Discharge status: Home / Self Care | Payer: Medicare Other | Source: Ambulatory Visit | Attending: Radiation Oncology

## 2023-06-11 ENCOUNTER — Ambulatory Visit: Payer: Medicare Other

## 2023-06-11 ENCOUNTER — Other Ambulatory Visit (HOSPITAL_COMMUNITY): Payer: Self-pay

## 2023-06-11 ENCOUNTER — Inpatient Hospital Stay (HOSPITAL_COMMUNITY): Payer: Medicare Other

## 2023-06-11 DIAGNOSIS — Z510 Encounter for antineoplastic radiation therapy: Secondary | ICD-10-CM | POA: Diagnosis not present

## 2023-06-11 DIAGNOSIS — J42 Unspecified chronic bronchitis: Secondary | ICD-10-CM | POA: Diagnosis not present

## 2023-06-11 DIAGNOSIS — C321 Malignant neoplasm of supraglottis: Secondary | ICD-10-CM | POA: Diagnosis not present

## 2023-06-11 DIAGNOSIS — I5032 Chronic diastolic (congestive) heart failure: Secondary | ICD-10-CM | POA: Diagnosis not present

## 2023-06-11 DIAGNOSIS — I2510 Atherosclerotic heart disease of native coronary artery without angina pectoris: Secondary | ICD-10-CM | POA: Diagnosis not present

## 2023-06-11 DIAGNOSIS — L03115 Cellulitis of right lower limb: Secondary | ICD-10-CM | POA: Diagnosis not present

## 2023-06-11 LAB — MAGNESIUM: Magnesium: 2.2 mg/dL (ref 1.7–2.4)

## 2023-06-11 LAB — COMPREHENSIVE METABOLIC PANEL
ALT: 29 U/L (ref 0–44)
AST: 28 U/L (ref 15–41)
Albumin: 3.8 g/dL (ref 3.5–5.0)
Alkaline Phosphatase: 51 U/L (ref 38–126)
Anion gap: 10 (ref 5–15)
BUN: 37 mg/dL — ABNORMAL HIGH (ref 8–23)
CO2: 30 mmol/L (ref 22–32)
Calcium: 9.3 mg/dL (ref 8.9–10.3)
Chloride: 95 mmol/L — ABNORMAL LOW (ref 98–111)
Creatinine, Ser: 0.75 mg/dL (ref 0.44–1.00)
GFR, Estimated: >60 mL/min (ref >60)
Glucose, Bld: 99 mg/dL (ref 70–99)
Potassium: 4.4 mmol/L (ref 3.5–5.1)
Sodium: 135 mmol/L (ref 135–145)
Total Bilirubin: 0.8 mg/dL (ref 0.3–1.2)
Total Protein: 7.4 g/dL (ref 6.5–8.1)

## 2023-06-11 LAB — CBC WITH DIFFERENTIAL/PLATELET
Abs Immature Granulocytes: 0.12 10*3/uL — ABNORMAL HIGH (ref 0.00–0.07)
Basophils Absolute: 0 10*3/uL (ref 0.0–0.1)
Basophils Relative: 1 %
Eosinophils Absolute: 0.5 10*3/uL (ref 0.0–0.5)
Eosinophils Relative: 10 %
HCT: 35.3 % — ABNORMAL LOW (ref 36.0–46.0)
Hemoglobin: 11.5 g/dL — ABNORMAL LOW (ref 12.0–15.0)
Immature Granulocytes: 2 %
Lymphocytes Relative: 16 %
Lymphs Abs: 0.8 10*3/uL (ref 0.7–4.0)
MCH: 32.6 pg (ref 26.0–34.0)
MCHC: 32.6 g/dL (ref 30.0–36.0)
MCV: 100 fL (ref 80.0–100.0)
Monocytes Absolute: 0.5 10*3/uL (ref 0.1–1.0)
Monocytes Relative: 11 %
Neutro Abs: 3 10*3/uL (ref 1.7–7.7)
Neutrophils Relative %: 60 %
Platelets: 226 10*3/uL (ref 150–400)
RBC: 3.53 MIL/uL — ABNORMAL LOW (ref 3.87–5.11)
RDW: 20.4 % — ABNORMAL HIGH (ref 11.5–15.5)
WBC: 4.9 10*3/uL (ref 4.0–10.5)
nRBC: 0.8 % — ABNORMAL HIGH (ref 0.0–0.2)

## 2023-06-11 LAB — RAD ONC ARIA SESSION SUMMARY
Course Elapsed Days: 20
Plan Fractions Treated to Date: 14
Plan Prescribed Dose Per Fraction: 2 Gy
Plan Total Fractions Prescribed: 35
Plan Total Prescribed Dose: 70 Gy
Reference Point Dosage Given to Date: 28 Gy
Reference Point Session Dosage Given: 2 Gy
Session Number: 14

## 2023-06-11 LAB — PHOSPHORUS: Phosphorus: 3.6 mg/dL (ref 2.5–4.6)

## 2023-06-11 MED ORDER — PHENOL 1.4 % MT LIQD
1.0000 | OROMUCOSAL | 0 refills | Status: DC | PRN
  Filled 2023-06-11: qty 118, fill #0

## 2023-06-11 MED ORDER — GUAIFENESIN ER 600 MG PO TB12
600.0000 mg | ORAL_TABLET | Freq: Two times a day (BID) | ORAL | 0 refills | Status: AC
  Filled 2023-06-11: qty 20, 10d supply, fill #0

## 2023-06-11 MED ORDER — SORBITOL 70 % SOLN
960.0000 mL | TOPICAL_OIL | Freq: Once | ORAL | Status: DC
  Filled 2023-06-11: qty 240

## 2023-06-11 MED ORDER — BISACODYL 10 MG RE SUPP
10.0000 mg | Freq: Every day | RECTAL | 0 refills | Status: DC | PRN
  Filled 2023-06-11: qty 12, 12d supply, fill #0

## 2023-06-11 MED ORDER — INFLUENZA VAC A&B SURF ANT ADJ 0.5 ML IM SUSY
0.5000 mL | PREFILLED_SYRINGE | Freq: Once | INTRAMUSCULAR | Status: AC
  Administered 2023-06-11: 0.5 mL via INTRAMUSCULAR
  Filled 2023-06-11: qty 0.5

## 2023-06-11 MED ORDER — SORBITOL 70 % SOLN
960.0000 mL | TOPICAL_OIL | Freq: Once | ORAL | Status: AC
  Administered 2023-06-11: 960 mL via RECTAL
  Filled 2023-06-11: qty 240

## 2023-06-11 MED ORDER — MEDIHONEY WOUND/BURN DRESSING EX PSTE
1.0000 | PASTE | Freq: Every day | CUTANEOUS | 0 refills | Status: DC
  Filled 2023-06-11: qty 44, 44d supply, fill #0

## 2023-06-11 MED ORDER — SALINE SPRAY 0.65 % NA SOLN
1.0000 | NASAL | 0 refills | Status: DC | PRN
  Filled 2023-06-11: qty 44, 10d supply, fill #0

## 2023-06-11 MED ORDER — ENSURE ENLIVE PO LIQD
237.0000 mL | Freq: Two times a day (BID) | ORAL | 12 refills | Status: DC
  Filled 2023-06-11: qty 237, 1d supply, fill #0

## 2023-06-11 MED ORDER — CEFADROXIL 500 MG PO CAPS: 500.0000 mg | ORAL_CAPSULE | Freq: Two times a day (BID) | ORAL | Status: DC

## 2023-06-11 MED ORDER — POLYETHYLENE GLYCOL 3350 17 GM/SCOOP PO POWD
17.0000 g | Freq: Every day | ORAL | 0 refills | Status: DC
  Filled 2023-06-11: qty 238, 14d supply, fill #0

## 2023-06-11 MED ORDER — CEFADROXIL 500 MG PO CAPS
500.0000 mg | ORAL_CAPSULE | Freq: Two times a day (BID) | ORAL | 0 refills | Status: AC
  Filled 2023-06-11: qty 6, 3d supply, fill #0

## 2023-06-11 NOTE — Plan of Care (Signed)

## 2023-06-11 NOTE — Plan of Care (Signed)
  Problem: Education: Goal: Understanding of cardiac disease, CV risk reduction, and recovery process will improve Outcome: Adequate for Discharge Goal: Individualized Educational Video(s) Outcome: Adequate for Discharge   Problem: Activity: Goal: Ability to tolerate increased activity will improve Outcome: Adequate for Discharge   Problem: Cardiac: Goal: Ability to achieve and maintain adequate cardiovascular perfusion will improve Outcome: Adequate for Discharge   Problem: Health Behavior/Discharge Planning: Goal: Ability to safely manage health-related needs after discharge will improve Outcome: Adequate for Discharge   Problem: Education: Goal: Understanding of CV disease, CV risk reduction, and recovery process will improve Outcome: Adequate for Discharge Goal: Individualized Educational Video(s) Outcome: Adequate for Discharge   Problem: Activity: Goal: Ability to return to baseline activity level will improve Outcome: Adequate for Discharge   Problem: Cardiovascular: Goal: Ability to achieve and maintain adequate cardiovascular perfusion will improve Outcome: Adequate for Discharge Goal: Vascular access site(s) Level 0-1 will be maintained Outcome: Adequate for Discharge   Problem: Health Behavior/Discharge Planning: Goal: Ability to safely manage health-related needs after discharge will improve Outcome: Adequate for Discharge   Problem: Clinical Measurements: Goal: Ability to avoid or minimize complications of infection will improve Outcome: Adequate for Discharge   Problem: Skin Integrity: Goal: Skin integrity will improve Outcome: Adequate for Discharge   Problem: Education: Goal: Knowledge of General Education information will improve Description: Including pain rating scale, medication(s)/side effects and non-pharmacologic comfort measures Outcome: Adequate for Discharge   Problem: Health Behavior/Discharge Planning: Goal: Ability to manage  health-related needs will improve Outcome: Adequate for Discharge   Problem: Clinical Measurements: Goal: Ability to maintain clinical measurements within normal limits will improve Outcome: Adequate for Discharge Goal: Will remain free from infection Outcome: Adequate for Discharge Goal: Diagnostic test results will improve Outcome: Adequate for Discharge Goal: Respiratory complications will improve Outcome: Adequate for Discharge Goal: Cardiovascular complication will be avoided Outcome: Adequate for Discharge   Problem: Activity: Goal: Risk for activity intolerance will decrease Outcome: Adequate for Discharge   Problem: Nutrition: Goal: Adequate nutrition will be maintained Outcome: Adequate for Discharge   Problem: Coping: Goal: Level of anxiety will decrease Outcome: Adequate for Discharge   Problem: Elimination: Goal: Will not experience complications related to bowel motility Outcome: Adequate for Discharge Goal: Will not experience complications related to urinary retention Outcome: Adequate for Discharge   Problem: Pain Managment: Goal: General experience of comfort will improve Outcome: Adequate for Discharge   Problem: Safety: Goal: Ability to remain free from injury will improve Outcome: Adequate for Discharge   Problem: Skin Integrity: Goal: Risk for impaired skin integrity will decrease Outcome: Adequate for Discharge

## 2023-06-11 NOTE — Discharge Summary (Signed)
Rt Pressure (mmHg)IndexWaveform   Comment  +---------+------------------+-----+-----------+--------+ Brachial 123                    triphasic           +---------+------------------+-----+-----------+--------+ PTA                             monophasic          +---------+------------------+-----+-----------+--------+ DP       95                0.77 multiphasic         +---------+------------------+-----+-----------+--------+ Great Toe39                0.32                      +---------+------------------+-----+-----------+--------+ +---------+------------------+-----+----------+-------+ Left     Lt Pressure (mmHg)IndexWaveform  Comment +---------+------------------+-----+----------+-------+ Brachial 120                    triphasic         +---------+------------------+-----+----------+-------+ PTA      89                0.72 monophasic        +---------+------------------+-----+----------+-------+ DP       95                0.77 monophasic        +---------+------------------+-----+----------+-------+ Great Toe36                0.29                   +---------+------------------+-----+----------+-------+ +-------+-----------+-----------+------------+------------+ ABI/TBIToday's ABIToday's TBIPrevious ABIPrevious TBI +-------+-----------+-----------+------------+------------+ Right  0.77       0.32       1.04        0.39         +-------+-----------+-----------+------------+------------+ Left   0.77       0.29       1.06        0.47         +-------+-----------+-----------+------------+------------+  Summary: Right: Resting right ankle-brachial index indicates moderate right lower extremity arterial disease. The right toe-brachial index is abnormal. Left: Resting left ankle-brachial index indicates moderate left lower extremity arterial disease. The left toe-brachial index is abnormal. *See table(s) above for measurements and observations.  Electronically signed by Coral Else MD on 06/08/2023 at 10:05:45 AM.    Final    VAS [place] LOWER EXTREMITY VENOUS (DVT) (7a-7p)  Result Date: 06/05/2023  Lower Venous DVT Study Patient Name:  Marie Johnson  Date of Exam:   06/05/2023 Medical Rec #: [id number]       Accession #:    [id number] Date of Birth: 1951/07/09       Patient Gender: F Patient Age:   72 years Exam Location:  [facility] Procedure:      VAS [place] LOWER EXTREMITY VENOUS (DVT) Referring Phys: [facility]  --------------------------------------------------------------------------------  Indications: Swelling.  Risk Factors: None identified. Limitations: Poor ultrasound/tissue interface and patient pain tolerance. Comparison Study: No prior studies. Performing Technologist: Chanda Busing RVT  Examination Guidelines: A complete evaluation includes B-mode imaging, spectral Doppler, color Doppler, and power Doppler as needed of all accessible portions of each vessel. Bilateral testing is considered an integral part of a complete examination. Limited examinations for reoccurring indications may be performed as noted.  Rt Pressure (mmHg)IndexWaveform   Comment  +---------+------------------+-----+-----------+--------+ Brachial 123                    triphasic           +---------+------------------+-----+-----------+--------+ PTA                             monophasic          +---------+------------------+-----+-----------+--------+ DP       95                0.77 multiphasic         +---------+------------------+-----+-----------+--------+ Great Toe39                0.32                      +---------+------------------+-----+-----------+--------+ +---------+------------------+-----+----------+-------+ Left     Lt Pressure (mmHg)IndexWaveform  Comment +---------+------------------+-----+----------+-------+ Brachial 120                    triphasic         +---------+------------------+-----+----------+-------+ PTA      89                0.72 monophasic        +---------+------------------+-----+----------+-------+ DP       95                0.77 monophasic        +---------+------------------+-----+----------+-------+ Great Toe36                0.29                   +---------+------------------+-----+----------+-------+ +-------+-----------+-----------+------------+------------+ ABI/TBIToday's ABIToday's TBIPrevious ABIPrevious TBI +-------+-----------+-----------+------------+------------+ Right  0.77       0.32       1.04        0.39         +-------+-----------+-----------+------------+------------+ Left   0.77       0.29       1.06        0.47         +-------+-----------+-----------+------------+------------+  Summary: Right: Resting right ankle-brachial index indicates moderate right lower extremity arterial disease. The right toe-brachial index is abnormal. Left: Resting left ankle-brachial index indicates moderate left lower extremity arterial disease. The left toe-brachial index is abnormal. *See table(s) above for measurements and observations.  Electronically signed by Coral Else MD on 06/08/2023 at 10:05:45 AM.    Final    VAS [place] LOWER EXTREMITY VENOUS (DVT) (7a-7p)  Result Date: 06/05/2023  Lower Venous DVT Study Patient Name:  Marie Johnson  Date of Exam:   06/05/2023 Medical Rec #: [id number]       Accession #:    [id number] Date of Birth: 1951/07/09       Patient Gender: F Patient Age:   72 years Exam Location:  [facility] Procedure:      VAS [place] LOWER EXTREMITY VENOUS (DVT) Referring Phys: [facility]  --------------------------------------------------------------------------------  Indications: Swelling.  Risk Factors: None identified. Limitations: Poor ultrasound/tissue interface and patient pain tolerance. Comparison Study: No prior studies. Performing Technologist: Chanda Busing RVT  Examination Guidelines: A complete evaluation includes B-mode imaging, spectral Doppler, color Doppler, and power Doppler as needed of all accessible portions of each vessel. Bilateral testing is considered an integral part of a complete examination. Limited examinations for reoccurring indications may be performed as noted.  Rt Pressure (mmHg)IndexWaveform   Comment  +---------+------------------+-----+-----------+--------+ Brachial 123                    triphasic           +---------+------------------+-----+-----------+--------+ PTA                             monophasic          +---------+------------------+-----+-----------+--------+ DP       95                0.77 multiphasic         +---------+------------------+-----+-----------+--------+ Great Toe39                0.32                      +---------+------------------+-----+-----------+--------+ +---------+------------------+-----+----------+-------+ Left     Lt Pressure (mmHg)IndexWaveform  Comment +---------+------------------+-----+----------+-------+ Brachial 120                    triphasic         +---------+------------------+-----+----------+-------+ PTA      89                0.72 monophasic        +---------+------------------+-----+----------+-------+ DP       95                0.77 monophasic        +---------+------------------+-----+----------+-------+ Great Toe36                0.29                   +---------+------------------+-----+----------+-------+ +-------+-----------+-----------+------------+------------+ ABI/TBIToday's ABIToday's TBIPrevious ABIPrevious TBI +-------+-----------+-----------+------------+------------+ Right  0.77       0.32       1.04        0.39         +-------+-----------+-----------+------------+------------+ Left   0.77       0.29       1.06        0.47         +-------+-----------+-----------+------------+------------+  Summary: Right: Resting right ankle-brachial index indicates moderate right lower extremity arterial disease. The right toe-brachial index is abnormal. Left: Resting left ankle-brachial index indicates moderate left lower extremity arterial disease. The left toe-brachial index is abnormal. *See table(s) above for measurements and observations.  Electronically signed by Coral Else MD on 06/08/2023 at 10:05:45 AM.    Final    VAS [place] LOWER EXTREMITY VENOUS (DVT) (7a-7p)  Result Date: 06/05/2023  Lower Venous DVT Study Patient Name:  Marie Johnson  Date of Exam:   06/05/2023 Medical Rec #: [id number]       Accession #:    [id number] Date of Birth: 1951/07/09       Patient Gender: F Patient Age:   72 years Exam Location:  [facility] Procedure:      VAS [place] LOWER EXTREMITY VENOUS (DVT) Referring Phys: [facility]  --------------------------------------------------------------------------------  Indications: Swelling.  Risk Factors: None identified. Limitations: Poor ultrasound/tissue interface and patient pain tolerance. Comparison Study: No prior studies. Performing Technologist: Chanda Busing RVT  Examination Guidelines: A complete evaluation includes B-mode imaging, spectral Doppler, color Doppler, and power Doppler as needed of all accessible portions of each vessel. Bilateral testing is considered an integral part of a complete examination. Limited examinations for reoccurring indications may be performed as noted.  Rt Pressure (mmHg)IndexWaveform   Comment  +---------+------------------+-----+-----------+--------+ Brachial 123                    triphasic           +---------+------------------+-----+-----------+--------+ PTA                             monophasic          +---------+------------------+-----+-----------+--------+ DP       95                0.77 multiphasic         +---------+------------------+-----+-----------+--------+ Great Toe39                0.32                      +---------+------------------+-----+-----------+--------+ +---------+------------------+-----+----------+-------+ Left     Lt Pressure (mmHg)IndexWaveform  Comment +---------+------------------+-----+----------+-------+ Brachial 120                    triphasic         +---------+------------------+-----+----------+-------+ PTA      89                0.72 monophasic        +---------+------------------+-----+----------+-------+ DP       95                0.77 monophasic        +---------+------------------+-----+----------+-------+ Great Toe36                0.29                   +---------+------------------+-----+----------+-------+ +-------+-----------+-----------+------------+------------+ ABI/TBIToday's ABIToday's TBIPrevious ABIPrevious TBI +-------+-----------+-----------+------------+------------+ Right  0.77       0.32       1.04        0.39         +-------+-----------+-----------+------------+------------+ Left   0.77       0.29       1.06        0.47         +-------+-----------+-----------+------------+------------+  Summary: Right: Resting right ankle-brachial index indicates moderate right lower extremity arterial disease. The right toe-brachial index is abnormal. Left: Resting left ankle-brachial index indicates moderate left lower extremity arterial disease. The left toe-brachial index is abnormal. *See table(s) above for measurements and observations.  Electronically signed by Coral Else MD on 06/08/2023 at 10:05:45 AM.    Final    VAS [place] LOWER EXTREMITY VENOUS (DVT) (7a-7p)  Result Date: 06/05/2023  Lower Venous DVT Study Patient Name:  Marie Johnson  Date of Exam:   06/05/2023 Medical Rec #: [id number]       Accession #:    [id number] Date of Birth: 1951/07/09       Patient Gender: F Patient Age:   72 years Exam Location:  [facility] Procedure:      VAS [place] LOWER EXTREMITY VENOUS (DVT) Referring Phys: [facility]  --------------------------------------------------------------------------------  Indications: Swelling.  Risk Factors: None identified. Limitations: Poor ultrasound/tissue interface and patient pain tolerance. Comparison Study: No prior studies. Performing Technologist: Chanda Busing RVT  Examination Guidelines: A complete evaluation includes B-mode imaging, spectral Doppler, color Doppler, and power Doppler as needed of all accessible portions of each vessel. Bilateral testing is considered an integral part of a complete examination. Limited examinations for reoccurring indications may be performed as noted.  Rt Pressure (mmHg)IndexWaveform   Comment  +---------+------------------+-----+-----------+--------+ Brachial 123                    triphasic           +---------+------------------+-----+-----------+--------+ PTA                             monophasic          +---------+------------------+-----+-----------+--------+ DP       95                0.77 multiphasic         +---------+------------------+-----+-----------+--------+ Great Toe39                0.32                      +---------+------------------+-----+-----------+--------+ +---------+------------------+-----+----------+-------+ Left     Lt Pressure (mmHg)IndexWaveform  Comment +---------+------------------+-----+----------+-------+ Brachial 120                    triphasic         +---------+------------------+-----+----------+-------+ PTA      89                0.72 monophasic        +---------+------------------+-----+----------+-------+ DP       95                0.77 monophasic        +---------+------------------+-----+----------+-------+ Great Toe36                0.29                   +---------+------------------+-----+----------+-------+ +-------+-----------+-----------+------------+------------+ ABI/TBIToday's ABIToday's TBIPrevious ABIPrevious TBI +-------+-----------+-----------+------------+------------+ Right  0.77       0.32       1.04        0.39         +-------+-----------+-----------+------------+------------+ Left   0.77       0.29       1.06        0.47         +-------+-----------+-----------+------------+------------+  Summary: Right: Resting right ankle-brachial index indicates moderate right lower extremity arterial disease. The right toe-brachial index is abnormal. Left: Resting left ankle-brachial index indicates moderate left lower extremity arterial disease. The left toe-brachial index is abnormal. *See table(s) above for measurements and observations.  Electronically signed by Coral Else MD on 06/08/2023 at 10:05:45 AM.    Final    VAS [place] LOWER EXTREMITY VENOUS (DVT) (7a-7p)  Result Date: 06/05/2023  Lower Venous DVT Study Patient Name:  Marie Johnson  Date of Exam:   06/05/2023 Medical Rec #: [id number]       Accession #:    [id number] Date of Birth: 1951/07/09       Patient Gender: F Patient Age:   72 years Exam Location:  [facility] Procedure:      VAS [place] LOWER EXTREMITY VENOUS (DVT) Referring Phys: [facility]  --------------------------------------------------------------------------------  Indications: Swelling.  Risk Factors: None identified. Limitations: Poor ultrasound/tissue interface and patient pain tolerance. Comparison Study: No prior studies. Performing Technologist: Chanda Busing RVT  Examination Guidelines: A complete evaluation includes B-mode imaging, spectral Doppler, color Doppler, and power Doppler as needed of all accessible portions of each vessel. Bilateral testing is considered an integral part of a complete examination. Limited examinations for reoccurring indications may be performed as noted.  GU: Deferred. Musculoskeletal: No clubbing / cyanosis of digits/nails. No joint deformity upper and lower extremities Skin: Right lower extremity is much improved and not as erythematous or red.  Wound is covered. Neurologic: CN 2-12 grossly intact with no focal deficits. Romberg sign and cerebellar reflexes not assessed.  Psychiatric: Normal judgment and insight. Alert and oriented x 3. Normal mood and appropriate affect.   Condition at discharge: stable  The results of significant diagnostics from this hospitalization (including imaging, microbiology, ancillary and laboratory) are listed below for reference.   Imaging Studies: DG Abd 1 View  Result Date: 06/11/2023 CLINICAL DATA:  Constipation, abdominal distention EXAM: ABDOMEN - 1 VIEW COMPARISON:  KUB 1 day prior FINDINGS: There is no evidence of bowel obstruction. A large colonic stool burden is again seen. There is no definite free intraperitoneal air. 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 There is no acute osseous abnormality. IMPRESSION: No significant change since the KUB from 1 day prior. Unchanged large colonic stool burden without evidence of mechanical obstruction. Electronically Signed   By: Lesia Hausen M.D.   On: 06/11/2023 12:18   DG Abd 2 Views  Result Date: 06/10/2023 CLINICAL DATA:  Abdominal distension. Abdominal pain. No bowel  movements. EXAM: ABDOMEN - 2 VIEW COMPARISON:  06/09/2023 FINDINGS: Large amount of gas and fecal matter throughout the colon. Small bowel pattern is normal. Vascular calcification and stents as seen previously. Previous bilateral hip replacements. Volume loss at the lower lungs right more than left as seen previously. IMPRESSION: Large amount of gas and fecal matter throughout the colon. No sign of small-bowel obstruction. Electronically Signed   By: Paulina Fusi M.D.   On: 06/10/2023 07:35   DG Abd 1 View  Result Date: 06/09/2023 CLINICAL DATA:  Pain and distension EXAM: ABDOMEN - 1 VIEW COMPARISON:  PET CT 04/26/2023 FINDINGS: Nonobstructed gas pattern with moderate stool in the colon. Right upper quadrant lucency, suspect related to high-riding colon. Bilateral hip replacements. Extensive aortoiliac vascular calcification with l iliac stents. IMPRESSION: Nonobstructed gas pattern with moderate stool in the colon. Lucency overlying the liver, favored to be related to high-riding colon, but suggest upright or decubitus view These results will be called to the ordering clinician or representative by the Radiologist Assistant, and communication documented in the PACS or [employer]. Electronically Signed   By: Jasmine Pang M.D.   On: 06/09/2023 16:31   DG CHEST PORT 1 VIEW  Result Date: 06/08/2023 CLINICAL DATA:  Shortness of breath EXAM: PORTABLE CHEST 1 VIEW COMPARISON:  04/06/2023 FINDINGS: Stable cardiomediastinal contours. Aortic atherosclerosis. Minimal bibasilar scarring or atelectasis. No pleural effusion or pneumothorax. IMPRESSION: Minimal bibasilar scarring or atelectasis. Electronically Signed   By: Duanne Guess D.O.   On: 06/08/2023 19:53   VAS [place] ABI WITH/WO TBI  Result Date: 06/08/2023  LOWER EXTREMITY DOPPLER STUDY Patient Name:  Marie Johnson  Date of Exam:   06/07/2023 Medical Rec #: [id number]       Accession #:    [id number] Date of Birth: [birth year]       Patient Gender: F  Patient Age:   9 years Exam Location:  [facility] [place] Procedure:      VAS [place] ABI WITH/WO TBI Referring Phys: Marguerita Merles --------------------------------------------------------------------------------  Indications: Rest pain.  Vascular Interventions: Left common femoral to above-knee popliteal  GU: Deferred. Musculoskeletal: No clubbing / cyanosis of digits/nails. No joint deformity upper and lower extremities Skin: Right lower extremity is much improved and not as erythematous or red.  Wound is covered. Neurologic: CN 2-12 grossly intact with no focal deficits. Romberg sign and cerebellar reflexes not assessed.  Psychiatric: Normal judgment and insight. Alert and oriented x 3. Normal mood and appropriate affect.   Condition at discharge: stable  The results of significant diagnostics from this hospitalization (including imaging, microbiology, ancillary and laboratory) are listed below for reference.   Imaging Studies: DG Abd 1 View  Result Date: 06/11/2023 CLINICAL DATA:  Constipation, abdominal distention EXAM: ABDOMEN - 1 VIEW COMPARISON:  KUB 1 day prior FINDINGS: There is no evidence of bowel obstruction. A large colonic stool burden is again seen. There is no definite free intraperitoneal air. 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 There is no acute osseous abnormality. IMPRESSION: No significant change since the KUB from 1 day prior. Unchanged large colonic stool burden without evidence of mechanical obstruction. Electronically Signed   By: Lesia Hausen M.D.   On: 06/11/2023 12:18   DG Abd 2 Views  Result Date: 06/10/2023 CLINICAL DATA:  Abdominal distension. Abdominal pain. No bowel  movements. EXAM: ABDOMEN - 2 VIEW COMPARISON:  06/09/2023 FINDINGS: Large amount of gas and fecal matter throughout the colon. Small bowel pattern is normal. Vascular calcification and stents as seen previously. Previous bilateral hip replacements. Volume loss at the lower lungs right more than left as seen previously. IMPRESSION: Large amount of gas and fecal matter throughout the colon. No sign of small-bowel obstruction. Electronically Signed   By: Paulina Fusi M.D.   On: 06/10/2023 07:35   DG Abd 1 View  Result Date: 06/09/2023 CLINICAL DATA:  Pain and distension EXAM: ABDOMEN - 1 VIEW COMPARISON:  PET CT 04/26/2023 FINDINGS: Nonobstructed gas pattern with moderate stool in the colon. Right upper quadrant lucency, suspect related to high-riding colon. Bilateral hip replacements. Extensive aortoiliac vascular calcification with l iliac stents. IMPRESSION: Nonobstructed gas pattern with moderate stool in the colon. Lucency overlying the liver, favored to be related to high-riding colon, but suggest upright or decubitus view These results will be called to the ordering clinician or representative by the Radiologist Assistant, and communication documented in the PACS or [employer]. Electronically Signed   By: Jasmine Pang M.D.   On: 06/09/2023 16:31   DG CHEST PORT 1 VIEW  Result Date: 06/08/2023 CLINICAL DATA:  Shortness of breath EXAM: PORTABLE CHEST 1 VIEW COMPARISON:  04/06/2023 FINDINGS: Stable cardiomediastinal contours. Aortic atherosclerosis. Minimal bibasilar scarring or atelectasis. No pleural effusion or pneumothorax. IMPRESSION: Minimal bibasilar scarring or atelectasis. Electronically Signed   By: Duanne Guess D.O.   On: 06/08/2023 19:53   VAS [place] ABI WITH/WO TBI  Result Date: 06/08/2023  LOWER EXTREMITY DOPPLER STUDY Patient Name:  Marie Johnson  Date of Exam:   06/07/2023 Medical Rec #: [id number]       Accession #:    [id number] Date of Birth: [birth year]       Patient Gender: F  Patient Age:   9 years Exam Location:  [facility] [place] Procedure:      VAS [place] ABI WITH/WO TBI Referring Phys: Marguerita Merles --------------------------------------------------------------------------------  Indications: Rest pain.  Vascular Interventions: Left common femoral to above-knee popliteal  Rt Pressure (mmHg)IndexWaveform   Comment  +---------+------------------+-----+-----------+--------+ Brachial 123                    triphasic           +---------+------------------+-----+-----------+--------+ PTA                             monophasic          +---------+------------------+-----+-----------+--------+ DP       95                0.77 multiphasic         +---------+------------------+-----+-----------+--------+ Great Toe39                0.32                      +---------+------------------+-----+-----------+--------+ +---------+------------------+-----+----------+-------+ Left     Lt Pressure (mmHg)IndexWaveform  Comment +---------+------------------+-----+----------+-------+ Brachial 120                    triphasic         +---------+------------------+-----+----------+-------+ PTA      89                0.72 monophasic        +---------+------------------+-----+----------+-------+ DP       95                0.77 monophasic        +---------+------------------+-----+----------+-------+ Great Toe36                0.29                   +---------+------------------+-----+----------+-------+ +-------+-----------+-----------+------------+------------+ ABI/TBIToday's ABIToday's TBIPrevious ABIPrevious TBI +-------+-----------+-----------+------------+------------+ Right  0.77       0.32       1.04        0.39         +-------+-----------+-----------+------------+------------+ Left   0.77       0.29       1.06        0.47         +-------+-----------+-----------+------------+------------+  Summary: Right: Resting right ankle-brachial index indicates moderate right lower extremity arterial disease. The right toe-brachial index is abnormal. Left: Resting left ankle-brachial index indicates moderate left lower extremity arterial disease. The left toe-brachial index is abnormal. *See table(s) above for measurements and observations.  Electronically signed by Coral Else MD on 06/08/2023 at 10:05:45 AM.    Final    VAS [place] LOWER EXTREMITY VENOUS (DVT) (7a-7p)  Result Date: 06/05/2023  Lower Venous DVT Study Patient Name:  Marie Johnson  Date of Exam:   06/05/2023 Medical Rec #: [id number]       Accession #:    [id number] Date of Birth: 1951/07/09       Patient Gender: F Patient Age:   72 years Exam Location:  [facility] Procedure:      VAS [place] LOWER EXTREMITY VENOUS (DVT) Referring Phys: [facility]  --------------------------------------------------------------------------------  Indications: Swelling.  Risk Factors: None identified. Limitations: Poor ultrasound/tissue interface and patient pain tolerance. Comparison Study: No prior studies. Performing Technologist: Chanda Busing RVT  Examination Guidelines: A complete evaluation includes B-mode imaging, spectral Doppler, color Doppler, and power Doppler as needed of all accessible portions of each vessel. Bilateral testing is considered an integral part of a complete examination. Limited examinations for reoccurring indications may be performed as noted.  Rt Pressure (mmHg)IndexWaveform   Comment  +---------+------------------+-----+-----------+--------+ Brachial 123                    triphasic           +---------+------------------+-----+-----------+--------+ PTA                             monophasic          +---------+------------------+-----+-----------+--------+ DP       95                0.77 multiphasic         +---------+------------------+-----+-----------+--------+ Great Toe39                0.32                      +---------+------------------+-----+-----------+--------+ +---------+------------------+-----+----------+-------+ Left     Lt Pressure (mmHg)IndexWaveform  Comment +---------+------------------+-----+----------+-------+ Brachial 120                    triphasic         +---------+------------------+-----+----------+-------+ PTA      89                0.72 monophasic        +---------+------------------+-----+----------+-------+ DP       95                0.77 monophasic        +---------+------------------+-----+----------+-------+ Great Toe36                0.29                   +---------+------------------+-----+----------+-------+ +-------+-----------+-----------+------------+------------+ ABI/TBIToday's ABIToday's TBIPrevious ABIPrevious TBI +-------+-----------+-----------+------------+------------+ Right  0.77       0.32       1.04        0.39         +-------+-----------+-----------+------------+------------+ Left   0.77       0.29       1.06        0.47         +-------+-----------+-----------+------------+------------+  Summary: Right: Resting right ankle-brachial index indicates moderate right lower extremity arterial disease. The right toe-brachial index is abnormal. Left: Resting left ankle-brachial index indicates moderate left lower extremity arterial disease. The left toe-brachial index is abnormal. *See table(s) above for measurements and observations.  Electronically signed by Coral Else MD on 06/08/2023 at 10:05:45 AM.    Final    VAS [place] LOWER EXTREMITY VENOUS (DVT) (7a-7p)  Result Date: 06/05/2023  Lower Venous DVT Study Patient Name:  Marie Johnson  Date of Exam:   06/05/2023 Medical Rec #: [id number]       Accession #:    [id number] Date of Birth: 1951/07/09       Patient Gender: F Patient Age:   72 years Exam Location:  [facility] Procedure:      VAS [place] LOWER EXTREMITY VENOUS (DVT) Referring Phys: [facility]  --------------------------------------------------------------------------------  Indications: Swelling.  Risk Factors: None identified. Limitations: Poor ultrasound/tissue interface and patient pain tolerance. Comparison Study: No prior studies. Performing Technologist: Chanda Busing RVT  Examination Guidelines: A complete evaluation includes B-mode imaging, spectral Doppler, color Doppler, and power Doppler as needed of all accessible portions of each vessel. Bilateral testing is considered an integral part of a complete examination. Limited examinations for reoccurring indications may be performed as noted.  Rt Pressure (mmHg)IndexWaveform   Comment  +---------+------------------+-----+-----------+--------+ Brachial 123                    triphasic           +---------+------------------+-----+-----------+--------+ PTA                             monophasic          +---------+------------------+-----+-----------+--------+ DP       95                0.77 multiphasic         +---------+------------------+-----+-----------+--------+ Great Toe39                0.32                      +---------+------------------+-----+-----------+--------+ +---------+------------------+-----+----------+-------+ Left     Lt Pressure (mmHg)IndexWaveform  Comment +---------+------------------+-----+----------+-------+ Brachial 120                    triphasic         +---------+------------------+-----+----------+-------+ PTA      89                0.72 monophasic        +---------+------------------+-----+----------+-------+ DP       95                0.77 monophasic        +---------+------------------+-----+----------+-------+ Great Toe36                0.29                   +---------+------------------+-----+----------+-------+ +-------+-----------+-----------+------------+------------+ ABI/TBIToday's ABIToday's TBIPrevious ABIPrevious TBI +-------+-----------+-----------+------------+------------+ Right  0.77       0.32       1.04        0.39         +-------+-----------+-----------+------------+------------+ Left   0.77       0.29       1.06        0.47         +-------+-----------+-----------+------------+------------+  Summary: Right: Resting right ankle-brachial index indicates moderate right lower extremity arterial disease. The right toe-brachial index is abnormal. Left: Resting left ankle-brachial index indicates moderate left lower extremity arterial disease. The left toe-brachial index is abnormal. *See table(s) above for measurements and observations.  Electronically signed by Coral Else MD on 06/08/2023 at 10:05:45 AM.    Final    VAS [place] LOWER EXTREMITY VENOUS (DVT) (7a-7p)  Result Date: 06/05/2023  Lower Venous DVT Study Patient Name:  Marie Johnson  Date of Exam:   06/05/2023 Medical Rec #: [id number]       Accession #:    [id number] Date of Birth: 1951/07/09       Patient Gender: F Patient Age:   72 years Exam Location:  [facility] Procedure:      VAS [place] LOWER EXTREMITY VENOUS (DVT) Referring Phys: [facility]  --------------------------------------------------------------------------------  Indications: Swelling.  Risk Factors: None identified. Limitations: Poor ultrasound/tissue interface and patient pain tolerance. Comparison Study: No prior studies. Performing Technologist: Chanda Busing RVT  Examination Guidelines: A complete evaluation includes B-mode imaging, spectral Doppler, color Doppler, and power Doppler as needed of all accessible portions of each vessel. Bilateral testing is considered an integral part of a complete examination. Limited examinations for reoccurring indications may be performed as noted.  GU: Deferred. Musculoskeletal: No clubbing / cyanosis of digits/nails. No joint deformity upper and lower extremities Skin: Right lower extremity is much improved and not as erythematous or red.  Wound is covered. Neurologic: CN 2-12 grossly intact with no focal deficits. Romberg sign and cerebellar reflexes not assessed.  Psychiatric: Normal judgment and insight. Alert and oriented x 3. Normal mood and appropriate affect.   Condition at discharge: stable  The results of significant diagnostics from this hospitalization (including imaging, microbiology, ancillary and laboratory) are listed below for reference.   Imaging Studies: DG Abd 1 View  Result Date: 06/11/2023 CLINICAL DATA:  Constipation, abdominal distention EXAM: ABDOMEN - 1 VIEW COMPARISON:  KUB 1 day prior FINDINGS: There is no evidence of bowel obstruction. A large colonic stool burden is again seen. There is no definite free intraperitoneal air. 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 There is no acute osseous abnormality. IMPRESSION: No significant change since the KUB from 1 day prior. Unchanged large colonic stool burden without evidence of mechanical obstruction. Electronically Signed   By: Lesia Hausen M.D.   On: 06/11/2023 12:18   DG Abd 2 Views  Result Date: 06/10/2023 CLINICAL DATA:  Abdominal distension. Abdominal pain. No bowel  movements. EXAM: ABDOMEN - 2 VIEW COMPARISON:  06/09/2023 FINDINGS: Large amount of gas and fecal matter throughout the colon. Small bowel pattern is normal. Vascular calcification and stents as seen previously. Previous bilateral hip replacements. Volume loss at the lower lungs right more than left as seen previously. IMPRESSION: Large amount of gas and fecal matter throughout the colon. No sign of small-bowel obstruction. Electronically Signed   By: Paulina Fusi M.D.   On: 06/10/2023 07:35   DG Abd 1 View  Result Date: 06/09/2023 CLINICAL DATA:  Pain and distension EXAM: ABDOMEN - 1 VIEW COMPARISON:  PET CT 04/26/2023 FINDINGS: Nonobstructed gas pattern with moderate stool in the colon. Right upper quadrant lucency, suspect related to high-riding colon. Bilateral hip replacements. Extensive aortoiliac vascular calcification with l iliac stents. IMPRESSION: Nonobstructed gas pattern with moderate stool in the colon. Lucency overlying the liver, favored to be related to high-riding colon, but suggest upright or decubitus view These results will be called to the ordering clinician or representative by the Radiologist Assistant, and communication documented in the PACS or [employer]. Electronically Signed   By: Jasmine Pang M.D.   On: 06/09/2023 16:31   DG CHEST PORT 1 VIEW  Result Date: 06/08/2023 CLINICAL DATA:  Shortness of breath EXAM: PORTABLE CHEST 1 VIEW COMPARISON:  04/06/2023 FINDINGS: Stable cardiomediastinal contours. Aortic atherosclerosis. Minimal bibasilar scarring or atelectasis. No pleural effusion or pneumothorax. IMPRESSION: Minimal bibasilar scarring or atelectasis. Electronically Signed   By: Duanne Guess D.O.   On: 06/08/2023 19:53   VAS [place] ABI WITH/WO TBI  Result Date: 06/08/2023  LOWER EXTREMITY DOPPLER STUDY Patient Name:  Marie Johnson  Date of Exam:   06/07/2023 Medical Rec #: [id number]       Accession #:    [id number] Date of Birth: [birth year]       Patient Gender: F  Patient Age:   9 years Exam Location:  [facility] Procedure:      VAS [place] ABI WITH/WO TBI Referring Phys: Marguerita Merles --------------------------------------------------------------------------------  Indications: Rest pain.  Vascular Interventions: Left common femoral to above-knee popliteal

## 2023-06-12 ENCOUNTER — Other Ambulatory Visit (HOSPITAL_COMMUNITY): Payer: Self-pay

## 2023-06-12 ENCOUNTER — Ambulatory Visit
Admission: RE | Admit: 2023-06-12 | Discharge: 2023-06-12 | Disposition: A | Discharge status: Home / Self Care | Payer: Medicare Other | Source: Ambulatory Visit | Attending: Radiation Oncology | Admitting: Radiation Oncology

## 2023-06-12 ENCOUNTER — Inpatient Hospital Stay: Payer: Medicare Other | Admitting: Dietician

## 2023-06-12 ENCOUNTER — Other Ambulatory Visit: Payer: Self-pay

## 2023-06-12 DIAGNOSIS — Z510 Encounter for antineoplastic radiation therapy: Secondary | ICD-10-CM | POA: Diagnosis not present

## 2023-06-12 DIAGNOSIS — C320 Malignant neoplasm of glottis: Secondary | ICD-10-CM | POA: Diagnosis not present

## 2023-06-12 DIAGNOSIS — C321 Malignant neoplasm of supraglottis: Secondary | ICD-10-CM | POA: Diagnosis not present

## 2023-06-12 LAB — RAD ONC ARIA SESSION SUMMARY
Course Elapsed Days: 21
Plan Fractions Treated to Date: 15
Plan Prescribed Dose Per Fraction: 2 Gy
Plan Total Fractions Prescribed: 35
Plan Total Prescribed Dose: 70 Gy
Reference Point Dosage Given to Date: 30 Gy
Reference Point Session Dosage Given: 2 Gy
Session Number: 15

## 2023-06-12 NOTE — Progress Notes (Signed)
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   9/18-9/24 - hospital admission with cellulitis   Met with pt and son in office following radiation. Pt discharged from hospital yesterday afternoon. She is tired today. Pt reports HH diet during admission. She did not each much other than baked sweet potatoes. She reports drinking 2-3 Ensure daily. Plans to increase to 4 now that she is home. Pt reports sore throat. Warm tea with lemon is soothing. She was given chloraseptic spray during admission which worked well.   Medications: dulcolax, lasix 40 mg x2/day, mucinex  Labs: reviewed   Anthropometrics: No new wt to trend - suspect some wt loss given hospitalization + lasix 80 mg/day  9/16 - 133.2 lb  9/9 - 137.4 lb 9/5 - 133.8 lb    NUTRITION DIAGNOSIS: Food and nutrition related knowledge deficit continues    INTERVENTION:  Encouraged soft moist high calorie, high protein foods Provided samples of Boost VHC for pt to try (530 kcal, 22 g) - encouraged drinking one/day + 3 Ensure Complete Provided samples of [place] chicken soup for added protein. Educated to mix with 4 oz warm water or add to soups     MONITORING, EVALUATION, GOAL: wt trends, intake   NEXT VISIT: Tuesday October 1 after RT

## 2023-06-13 ENCOUNTER — Other Ambulatory Visit: Payer: Self-pay

## 2023-06-13 ENCOUNTER — Ambulatory Visit (HOSPITAL_COMMUNITY): Payer: Medicare Other | Attending: Cardiology

## 2023-06-13 ENCOUNTER — Ambulatory Visit
Admission: RE | Admit: 2023-06-13 | Discharge: 2023-06-13 | Disposition: A | Discharge status: Home / Self Care | Payer: Medicare Other | Source: Ambulatory Visit | Attending: Radiation Oncology | Admitting: Radiation Oncology

## 2023-06-13 ENCOUNTER — Encounter (HOSPITAL_COMMUNITY): Payer: Self-pay

## 2023-06-13 DIAGNOSIS — J441 Chronic obstructive pulmonary disease with (acute) exacerbation: Secondary | ICD-10-CM | POA: Diagnosis not present

## 2023-06-13 DIAGNOSIS — C320 Malignant neoplasm of glottis: Secondary | ICD-10-CM | POA: Diagnosis not present

## 2023-06-13 DIAGNOSIS — Z510 Encounter for antineoplastic radiation therapy: Secondary | ICD-10-CM | POA: Diagnosis not present

## 2023-06-13 DIAGNOSIS — C329 Malignant neoplasm of larynx, unspecified: Secondary | ICD-10-CM | POA: Diagnosis not present

## 2023-06-13 DIAGNOSIS — C321 Malignant neoplasm of supraglottis: Secondary | ICD-10-CM | POA: Diagnosis not present

## 2023-06-13 LAB — RAD ONC ARIA SESSION SUMMARY
Course Elapsed Days: 22
Plan Fractions Treated to Date: 16
Plan Prescribed Dose Per Fraction: 2 Gy
Plan Total Fractions Prescribed: 35
Plan Total Prescribed Dose: 70 Gy
Reference Point Dosage Given to Date: 32 Gy
Reference Point Session Dosage Given: 2 Gy
Session Number: 16

## 2023-06-14 ENCOUNTER — Ambulatory Visit
Admission: RE | Admit: 2023-06-14 | Discharge: 2023-06-14 | Disposition: A | Discharge status: Home / Self Care | Payer: Medicare Other | Source: Ambulatory Visit | Attending: Radiation Oncology | Admitting: Radiation Oncology

## 2023-06-14 ENCOUNTER — Other Ambulatory Visit: Payer: Self-pay

## 2023-06-14 DIAGNOSIS — C320 Malignant neoplasm of glottis: Secondary | ICD-10-CM | POA: Diagnosis not present

## 2023-06-14 DIAGNOSIS — C321 Malignant neoplasm of supraglottis: Secondary | ICD-10-CM | POA: Diagnosis not present

## 2023-06-14 DIAGNOSIS — Z510 Encounter for antineoplastic radiation therapy: Secondary | ICD-10-CM | POA: Diagnosis not present

## 2023-06-14 LAB — RAD ONC ARIA SESSION SUMMARY
Course Elapsed Days: 23
Plan Fractions Treated to Date: 17
Plan Prescribed Dose Per Fraction: 2 Gy
Plan Total Fractions Prescribed: 35
Plan Total Prescribed Dose: 70 Gy
Reference Point Dosage Given to Date: 34 Gy
Reference Point Session Dosage Given: 2 Gy
Session Number: 17

## 2023-06-17 ENCOUNTER — Other Ambulatory Visit: Payer: Self-pay

## 2023-06-17 ENCOUNTER — Ambulatory Visit
Admission: RE | Admit: 2023-06-17 | Discharge: 2023-06-17 | Disposition: A | Discharge status: Home / Self Care | Payer: Medicare Other | Source: Ambulatory Visit | Attending: Radiation Oncology

## 2023-06-17 DIAGNOSIS — Z510 Encounter for antineoplastic radiation therapy: Secondary | ICD-10-CM | POA: Diagnosis not present

## 2023-06-17 DIAGNOSIS — C321 Malignant neoplasm of supraglottis: Secondary | ICD-10-CM | POA: Diagnosis not present

## 2023-06-17 DIAGNOSIS — C320 Malignant neoplasm of glottis: Secondary | ICD-10-CM | POA: Diagnosis not present

## 2023-06-17 LAB — RAD ONC ARIA SESSION SUMMARY
Course Elapsed Days: 26
Plan Fractions Treated to Date: 18
Plan Prescribed Dose Per Fraction: 2 Gy
Plan Total Fractions Prescribed: 35
Plan Total Prescribed Dose: 70 Gy
Reference Point Dosage Given to Date: 36 Gy
Reference Point Session Dosage Given: 2 Gy
Session Number: 18

## 2023-06-18 ENCOUNTER — Ambulatory Visit
Admission: RE | Admit: 2023-06-18 | Discharge: 2023-06-18 | Disposition: A | Discharge status: Home / Self Care | Payer: Medicare Other | Source: Ambulatory Visit | Attending: Radiation Oncology | Admitting: Radiation Oncology

## 2023-06-18 ENCOUNTER — Other Ambulatory Visit: Payer: Self-pay

## 2023-06-18 ENCOUNTER — Inpatient Hospital Stay: Payer: Medicare Other | Attending: Radiation Oncology | Admitting: Dietician

## 2023-06-18 DIAGNOSIS — C329 Malignant neoplasm of larynx, unspecified: Secondary | ICD-10-CM | POA: Insufficient documentation

## 2023-06-18 DIAGNOSIS — Z510 Encounter for antineoplastic radiation therapy: Secondary | ICD-10-CM | POA: Diagnosis not present

## 2023-06-18 DIAGNOSIS — C321 Malignant neoplasm of supraglottis: Secondary | ICD-10-CM | POA: Diagnosis not present

## 2023-06-18 DIAGNOSIS — C320 Malignant neoplasm of glottis: Secondary | ICD-10-CM | POA: Diagnosis not present

## 2023-06-18 LAB — RAD ONC ARIA SESSION SUMMARY
Course Elapsed Days: 27
Plan Fractions Treated to Date: 19
Plan Prescribed Dose Per Fraction: 2 Gy
Plan Total Fractions Prescribed: 35
Plan Total Prescribed Dose: 70 Gy
Reference Point Dosage Given to Date: 38 Gy
Reference Point Session Dosage Given: 2 Gy
Session Number: 19

## 2023-06-18 NOTE — Progress Notes (Signed)
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    9/18-9/24 - hospital admission with cellulitis   Met with patient and son in office following therapy. Pt reports increased sore throat. She has thick saliva. Pt has not been using baking soda salt water gargles. Pt tolerating small amounts of soft moist textures. Last night pt had braised brisket/gravy, mashed potatoes, green beans. This tasted good. She has increased intake of Ensure Complete 4-5/day.   Medications: reviewed   Labs: no new labs   Anthropometrics: Wt 137.2 lb Marie Johnson) on 9/30 - increased  9/16 - 133.2 lb 9/9 - 137.4 lb 9/5 - 133.8 lb    NUTRITION DIAGNOSIS: Food and nutrition related knowledge deficit improving    INTERVENTION:  Encouraged high calorie high protein foods in soft moist textures Continue drinking 4-5 Ensure Complete - samples provided Encouraged pt to try lidocaine rinse 20-30 minutes before eating Encouraged baking soda salt water rinses several times daily    MONITORING, EVALUATION, GOAL: wt trends, intake   NEXT VISIT: Tuesday October 8 after RT

## 2023-06-19 ENCOUNTER — Ambulatory Visit
Admission: RE | Admit: 2023-06-19 | Discharge: 2023-06-19 | Disposition: A | Discharge status: Home / Self Care | Payer: Medicare Other | Source: Ambulatory Visit | Attending: Radiation Oncology

## 2023-06-19 ENCOUNTER — Other Ambulatory Visit: Payer: Self-pay

## 2023-06-19 DIAGNOSIS — C321 Malignant neoplasm of supraglottis: Secondary | ICD-10-CM | POA: Diagnosis not present

## 2023-06-19 DIAGNOSIS — Z510 Encounter for antineoplastic radiation therapy: Secondary | ICD-10-CM | POA: Diagnosis not present

## 2023-06-19 DIAGNOSIS — C320 Malignant neoplasm of glottis: Secondary | ICD-10-CM | POA: Diagnosis not present

## 2023-06-19 DIAGNOSIS — C329 Malignant neoplasm of larynx, unspecified: Secondary | ICD-10-CM | POA: Diagnosis not present

## 2023-06-19 LAB — RAD ONC ARIA SESSION SUMMARY
Course Elapsed Days: 28
Plan Fractions Treated to Date: 20
Plan Prescribed Dose Per Fraction: 2 Gy
Plan Total Fractions Prescribed: 35
Plan Total Prescribed Dose: 70 Gy
Reference Point Dosage Given to Date: 40 Gy
Reference Point Session Dosage Given: 2 Gy
Session Number: 20

## 2023-06-20 ENCOUNTER — Other Ambulatory Visit: Payer: Self-pay

## 2023-06-20 ENCOUNTER — Ambulatory Visit
Admission: RE | Admit: 2023-06-20 | Discharge: 2023-06-20 | Disposition: A | Discharge status: Home / Self Care | Payer: Medicare Other | Source: Ambulatory Visit | Attending: Radiation Oncology | Admitting: Radiation Oncology

## 2023-06-20 DIAGNOSIS — C320 Malignant neoplasm of glottis: Secondary | ICD-10-CM | POA: Diagnosis not present

## 2023-06-20 DIAGNOSIS — Z510 Encounter for antineoplastic radiation therapy: Secondary | ICD-10-CM | POA: Diagnosis not present

## 2023-06-20 DIAGNOSIS — C329 Malignant neoplasm of larynx, unspecified: Secondary | ICD-10-CM | POA: Diagnosis not present

## 2023-06-20 DIAGNOSIS — C321 Malignant neoplasm of supraglottis: Secondary | ICD-10-CM | POA: Diagnosis not present

## 2023-06-20 LAB — RAD ONC ARIA SESSION SUMMARY
Course Elapsed Days: 29
Plan Fractions Treated to Date: 21
Plan Prescribed Dose Per Fraction: 2 Gy
Plan Total Fractions Prescribed: 35
Plan Total Prescribed Dose: 70 Gy
Reference Point Dosage Given to Date: 42 Gy
Reference Point Session Dosage Given: 2 Gy
Session Number: 21

## 2023-06-21 ENCOUNTER — Ambulatory Visit
Admission: RE | Admit: 2023-06-21 | Discharge: 2023-06-21 | Disposition: A | Discharge status: Home / Self Care | Payer: Medicare Other | Source: Ambulatory Visit | Attending: Radiation Oncology

## 2023-06-21 ENCOUNTER — Other Ambulatory Visit: Payer: Self-pay

## 2023-06-21 ENCOUNTER — Ambulatory Visit
Admission: RE | Admit: 2023-06-21 | Discharge: 2023-06-21 | Disposition: A | Discharge status: Home / Self Care | Payer: Medicare Other | Source: Ambulatory Visit | Attending: Radiation Oncology | Admitting: Radiation Oncology

## 2023-06-21 DIAGNOSIS — Z510 Encounter for antineoplastic radiation therapy: Secondary | ICD-10-CM | POA: Diagnosis not present

## 2023-06-21 DIAGNOSIS — C329 Malignant neoplasm of larynx, unspecified: Secondary | ICD-10-CM | POA: Diagnosis not present

## 2023-06-21 DIAGNOSIS — C320 Malignant neoplasm of glottis: Secondary | ICD-10-CM

## 2023-06-21 DIAGNOSIS — C321 Malignant neoplasm of supraglottis: Secondary | ICD-10-CM | POA: Diagnosis not present

## 2023-06-21 LAB — RAD ONC ARIA SESSION SUMMARY
Course Elapsed Days: 30
Plan Fractions Treated to Date: 22
Plan Prescribed Dose Per Fraction: 2 Gy
Plan Total Fractions Prescribed: 35
Plan Total Prescribed Dose: 70 Gy
Reference Point Dosage Given to Date: 44 Gy
Reference Point Session Dosage Given: 2 Gy
Session Number: 22

## 2023-06-21 MED ORDER — SONAFINE EX EMUL
1.0000 | Freq: Two times a day (BID) | CUTANEOUS | Status: DC
  Administered 2023-06-21: 1 via TOPICAL

## 2023-06-22 DIAGNOSIS — G8929 Other chronic pain: Secondary | ICD-10-CM | POA: Diagnosis not present

## 2023-06-22 DIAGNOSIS — M25552 Pain in left hip: Secondary | ICD-10-CM | POA: Diagnosis not present

## 2023-06-22 DIAGNOSIS — M25551 Pain in right hip: Secondary | ICD-10-CM | POA: Diagnosis not present

## 2023-06-24 ENCOUNTER — Ambulatory Visit
Admission: RE | Admit: 2023-06-24 | Discharge: 2023-06-24 | Disposition: A | Discharge status: Home / Self Care | Payer: Medicare Other | Source: Ambulatory Visit | Attending: Radiation Oncology | Admitting: Radiation Oncology

## 2023-06-24 ENCOUNTER — Other Ambulatory Visit: Payer: Self-pay

## 2023-06-24 DIAGNOSIS — C321 Malignant neoplasm of supraglottis: Secondary | ICD-10-CM | POA: Diagnosis not present

## 2023-06-24 DIAGNOSIS — Z510 Encounter for antineoplastic radiation therapy: Secondary | ICD-10-CM | POA: Diagnosis not present

## 2023-06-24 DIAGNOSIS — C320 Malignant neoplasm of glottis: Secondary | ICD-10-CM | POA: Diagnosis not present

## 2023-06-24 DIAGNOSIS — C329 Malignant neoplasm of larynx, unspecified: Secondary | ICD-10-CM | POA: Diagnosis not present

## 2023-06-24 LAB — RAD ONC ARIA SESSION SUMMARY
Course Elapsed Days: 33
Plan Fractions Treated to Date: 23
Plan Prescribed Dose Per Fraction: 2 Gy
Plan Total Fractions Prescribed: 35
Plan Total Prescribed Dose: 70 Gy
Reference Point Dosage Given to Date: 46 Gy
Reference Point Session Dosage Given: 2 Gy
Session Number: 23

## 2023-06-25 ENCOUNTER — Inpatient Hospital Stay: Payer: Medicare Other | Admitting: Dietician

## 2023-06-25 ENCOUNTER — Other Ambulatory Visit: Payer: Self-pay

## 2023-06-25 ENCOUNTER — Ambulatory Visit
Admission: RE | Admit: 2023-06-25 | Discharge: 2023-06-25 | Disposition: A | Discharge status: Home / Self Care | Payer: Medicare Other | Source: Ambulatory Visit | Attending: Radiation Oncology

## 2023-06-25 DIAGNOSIS — C329 Malignant neoplasm of larynx, unspecified: Secondary | ICD-10-CM | POA: Diagnosis not present

## 2023-06-25 DIAGNOSIS — Z510 Encounter for antineoplastic radiation therapy: Secondary | ICD-10-CM | POA: Diagnosis not present

## 2023-06-25 DIAGNOSIS — C321 Malignant neoplasm of supraglottis: Secondary | ICD-10-CM | POA: Diagnosis not present

## 2023-06-25 DIAGNOSIS — C320 Malignant neoplasm of glottis: Secondary | ICD-10-CM | POA: Diagnosis not present

## 2023-06-25 LAB — RAD ONC ARIA SESSION SUMMARY
Course Elapsed Days: 34
Plan Fractions Treated to Date: 24
Plan Prescribed Dose Per Fraction: 2 Gy
Plan Total Fractions Prescribed: 35
Plan Total Prescribed Dose: 70 Gy
Reference Point Dosage Given to Date: 48 Gy
Reference Point Session Dosage Given: 2 Gy
Session Number: 24

## 2023-06-25 NOTE — Progress Notes (Signed)
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    9/18-9/24 - hospital admission with cellulitis   Met with patient and son in office following therapy. Patient reports worsening sore throat. Lidocaine rinses are helpful, but relief does not last long. She is pushing nutrition and weights are trending up. Patient is happy about this. She is drinking 4-5 Ensure Complete. Patient is eating small amounts a few times day (biscuit/gravy, creamy chx noodle, oatmeal cookie). Patient is constipated. No BM for 2 days. She is prescribed lactulose for chronic constipation. She is planning to take this when she gets home.   Medications: reviewed   Labs: no new labs  Anthropometrics: Last wt 139.6 lb on 10/4 - increased  9/30 - 137.2 lb  9/16 - 133.2 lb 9/9 - 137.4 lb 9/5 - 133.8 lb    NUTRITION DIAGNOSIS: Food and nutrition related knowledge deficit improving     INTERVENTION:  Continue 4-5 Ensure Complete - samples provided Continue lidocaine rinse ~20 minutes prior to eating Continue working to increase intake of water Continue baking soda salt water rinses several times daily    MONITORING, EVALUATION, GOAL: wt trends, intake   NEXT VISIT: Tuesday October 15 after RT

## 2023-06-26 ENCOUNTER — Ambulatory Visit: Payer: Medicare Other

## 2023-06-26 ENCOUNTER — Ambulatory Visit
Admission: RE | Admit: 2023-06-26 | Discharge: 2023-06-26 | Disposition: A | Discharge status: Home / Self Care | Payer: Medicare Other | Source: Ambulatory Visit | Attending: Radiation Oncology

## 2023-06-26 ENCOUNTER — Other Ambulatory Visit: Payer: Self-pay

## 2023-06-26 DIAGNOSIS — C321 Malignant neoplasm of supraglottis: Secondary | ICD-10-CM | POA: Diagnosis not present

## 2023-06-26 DIAGNOSIS — C329 Malignant neoplasm of larynx, unspecified: Secondary | ICD-10-CM | POA: Diagnosis not present

## 2023-06-26 DIAGNOSIS — C320 Malignant neoplasm of glottis: Secondary | ICD-10-CM | POA: Diagnosis not present

## 2023-06-26 DIAGNOSIS — Z510 Encounter for antineoplastic radiation therapy: Secondary | ICD-10-CM | POA: Diagnosis not present

## 2023-06-26 LAB — RAD ONC ARIA SESSION SUMMARY
Course Elapsed Days: 35
Plan Fractions Treated to Date: 25
Plan Prescribed Dose Per Fraction: 2 Gy
Plan Total Fractions Prescribed: 35
Plan Total Prescribed Dose: 70 Gy
Reference Point Dosage Given to Date: 50 Gy
Reference Point Session Dosage Given: 2 Gy
Session Number: 25

## 2023-06-27 ENCOUNTER — Other Ambulatory Visit: Payer: Self-pay

## 2023-06-27 ENCOUNTER — Ambulatory Visit
Admission: RE | Admit: 2023-06-27 | Discharge: 2023-06-27 | Disposition: A | Discharge status: Home / Self Care | Payer: Medicare Other | Source: Ambulatory Visit | Attending: Radiation Oncology | Admitting: Radiation Oncology

## 2023-06-27 DIAGNOSIS — C329 Malignant neoplasm of larynx, unspecified: Secondary | ICD-10-CM | POA: Diagnosis not present

## 2023-06-27 DIAGNOSIS — Z510 Encounter for antineoplastic radiation therapy: Secondary | ICD-10-CM | POA: Diagnosis not present

## 2023-06-27 DIAGNOSIS — C320 Malignant neoplasm of glottis: Secondary | ICD-10-CM | POA: Diagnosis not present

## 2023-06-27 DIAGNOSIS — C321 Malignant neoplasm of supraglottis: Secondary | ICD-10-CM | POA: Diagnosis not present

## 2023-06-27 LAB — RAD ONC ARIA SESSION SUMMARY
Course Elapsed Days: 36
Plan Fractions Treated to Date: 26
Plan Prescribed Dose Per Fraction: 2 Gy
Plan Total Fractions Prescribed: 35
Plan Total Prescribed Dose: 70 Gy
Reference Point Dosage Given to Date: 52 Gy
Reference Point Session Dosage Given: 2 Gy
Session Number: 26

## 2023-06-28 ENCOUNTER — Ambulatory Visit
Admission: RE | Admit: 2023-06-28 | Discharge: 2023-06-28 | Disposition: A | Discharge status: Home / Self Care | Payer: Medicare Other | Source: Ambulatory Visit | Attending: Radiation Oncology | Admitting: Radiation Oncology

## 2023-06-28 ENCOUNTER — Other Ambulatory Visit: Payer: Self-pay

## 2023-06-28 DIAGNOSIS — C329 Malignant neoplasm of larynx, unspecified: Secondary | ICD-10-CM | POA: Diagnosis not present

## 2023-06-28 DIAGNOSIS — Z510 Encounter for antineoplastic radiation therapy: Secondary | ICD-10-CM | POA: Diagnosis not present

## 2023-06-28 DIAGNOSIS — C321 Malignant neoplasm of supraglottis: Secondary | ICD-10-CM | POA: Diagnosis not present

## 2023-06-28 DIAGNOSIS — L03119 Cellulitis of unspecified part of limb: Secondary | ICD-10-CM | POA: Diagnosis not present

## 2023-06-28 DIAGNOSIS — C320 Malignant neoplasm of glottis: Secondary | ICD-10-CM | POA: Diagnosis not present

## 2023-06-28 LAB — RAD ONC ARIA SESSION SUMMARY
Course Elapsed Days: 37
Plan Fractions Treated to Date: 27
Plan Prescribed Dose Per Fraction: 2 Gy
Plan Total Fractions Prescribed: 35
Plan Total Prescribed Dose: 70 Gy
Reference Point Dosage Given to Date: 54 Gy
Reference Point Session Dosage Given: 2 Gy
Session Number: 27

## 2023-07-01 ENCOUNTER — Ambulatory Visit
Admission: RE | Admit: 2023-07-01 | Discharge: 2023-07-01 | Disposition: A | Discharge status: Home / Self Care | Payer: Medicare Other | Source: Ambulatory Visit | Attending: Radiation Oncology

## 2023-07-01 ENCOUNTER — Other Ambulatory Visit: Payer: Self-pay

## 2023-07-01 DIAGNOSIS — C321 Malignant neoplasm of supraglottis: Secondary | ICD-10-CM | POA: Diagnosis not present

## 2023-07-01 DIAGNOSIS — C320 Malignant neoplasm of glottis: Secondary | ICD-10-CM | POA: Diagnosis not present

## 2023-07-01 DIAGNOSIS — C329 Malignant neoplasm of larynx, unspecified: Secondary | ICD-10-CM | POA: Diagnosis not present

## 2023-07-01 DIAGNOSIS — Z510 Encounter for antineoplastic radiation therapy: Secondary | ICD-10-CM | POA: Diagnosis not present

## 2023-07-01 LAB — RAD ONC ARIA SESSION SUMMARY
Course Elapsed Days: 40
Plan Fractions Treated to Date: 28
Plan Prescribed Dose Per Fraction: 2 Gy
Plan Total Fractions Prescribed: 35
Plan Total Prescribed Dose: 70 Gy
Reference Point Dosage Given to Date: 56 Gy
Reference Point Session Dosage Given: 2 Gy
Session Number: 28

## 2023-07-02 ENCOUNTER — Other Ambulatory Visit: Payer: Self-pay

## 2023-07-02 ENCOUNTER — Inpatient Hospital Stay: Payer: Medicare Other | Admitting: Dietician

## 2023-07-02 ENCOUNTER — Ambulatory Visit
Admission: RE | Admit: 2023-07-02 | Discharge: 2023-07-02 | Disposition: A | Discharge status: Home / Self Care | Payer: Medicare Other | Source: Ambulatory Visit | Attending: Radiation Oncology

## 2023-07-02 DIAGNOSIS — C321 Malignant neoplasm of supraglottis: Secondary | ICD-10-CM | POA: Diagnosis not present

## 2023-07-02 DIAGNOSIS — C329 Malignant neoplasm of larynx, unspecified: Secondary | ICD-10-CM | POA: Diagnosis not present

## 2023-07-02 DIAGNOSIS — Z510 Encounter for antineoplastic radiation therapy: Secondary | ICD-10-CM | POA: Diagnosis not present

## 2023-07-02 DIAGNOSIS — C320 Malignant neoplasm of glottis: Secondary | ICD-10-CM | POA: Diagnosis not present

## 2023-07-02 LAB — RAD ONC ARIA SESSION SUMMARY
Course Elapsed Days: 41
Plan Fractions Treated to Date: 29
Plan Prescribed Dose Per Fraction: 2 Gy
Plan Total Fractions Prescribed: 35
Plan Total Prescribed Dose: 70 Gy
Reference Point Dosage Given to Date: 58 Gy
Reference Point Session Dosage Given: 2 Gy
Session Number: 29

## 2023-07-02 NOTE — Progress Notes (Signed)
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   Met with patient in office following radiation therapy. Patient endorses fatigue. She is not resting well at night secondary to coughing and thick secretions. Patient reports baking soda salt water rinses many times a day. Her throat is sore. Lidocaine rinses are helpful for ~10 minutes. She is taking oxycodone 4x/day which is working well. Patient has been drinking more water. This makes her full and unable to drink as many Ensure. Patient is currently drinking 4/day. She is eating some by mouth. Recalls chicken broth, chicken sandwich with lots of mayonnaise. Patient is taking lactulose for constipation. Last BM was this morning.   Medications: reviewed  Labs: no new labs  Anthropometrics: Last wt 138.8 lb Jarold Song) on 10/14  10/4 - 139.6 lb  9/30 - 137.2 lb  9/16 - 133.2 lb 9/9 - 137.4 lb 9/5 - 133.8 lb    INTERVENTION:  Continue drinking 4-5 Ensure complete Continue working to increase water intake Continue baking soda salt water rinses Support and encouragement One bag of food + toiletries from [employer]   MONITORING, EVALUATION, GOAL: wt trends, intake   NEXT VISIT: Tuesday October 22 after RT

## 2023-07-03 ENCOUNTER — Ambulatory Visit
Admission: RE | Admit: 2023-07-03 | Discharge: 2023-07-03 | Disposition: A | Discharge status: Home / Self Care | Payer: Medicare Other | Source: Ambulatory Visit | Attending: Radiation Oncology

## 2023-07-03 ENCOUNTER — Other Ambulatory Visit: Payer: Self-pay

## 2023-07-03 DIAGNOSIS — Z510 Encounter for antineoplastic radiation therapy: Secondary | ICD-10-CM | POA: Diagnosis not present

## 2023-07-03 DIAGNOSIS — C321 Malignant neoplasm of supraglottis: Secondary | ICD-10-CM | POA: Diagnosis not present

## 2023-07-03 DIAGNOSIS — C320 Malignant neoplasm of glottis: Secondary | ICD-10-CM | POA: Diagnosis not present

## 2023-07-03 DIAGNOSIS — C329 Malignant neoplasm of larynx, unspecified: Secondary | ICD-10-CM | POA: Diagnosis not present

## 2023-07-03 LAB — RAD ONC ARIA SESSION SUMMARY
Course Elapsed Days: 42
Plan Fractions Treated to Date: 30
Plan Prescribed Dose Per Fraction: 2 Gy
Plan Total Fractions Prescribed: 35
Plan Total Prescribed Dose: 70 Gy
Reference Point Dosage Given to Date: 60 Gy
Reference Point Session Dosage Given: 2 Gy
Session Number: 30

## 2023-07-04 ENCOUNTER — Ambulatory Visit
Admission: RE | Admit: 2023-07-04 | Discharge: 2023-07-04 | Disposition: A | Discharge status: Home / Self Care | Payer: Medicare Other | Source: Ambulatory Visit | Attending: Radiation Oncology | Admitting: Radiation Oncology

## 2023-07-04 ENCOUNTER — Other Ambulatory Visit: Payer: Self-pay

## 2023-07-04 DIAGNOSIS — Z510 Encounter for antineoplastic radiation therapy: Secondary | ICD-10-CM | POA: Diagnosis not present

## 2023-07-04 DIAGNOSIS — C320 Malignant neoplasm of glottis: Secondary | ICD-10-CM | POA: Diagnosis not present

## 2023-07-04 DIAGNOSIS — C321 Malignant neoplasm of supraglottis: Secondary | ICD-10-CM | POA: Diagnosis not present

## 2023-07-04 DIAGNOSIS — C329 Malignant neoplasm of larynx, unspecified: Secondary | ICD-10-CM | POA: Diagnosis not present

## 2023-07-04 LAB — RAD ONC ARIA SESSION SUMMARY
Course Elapsed Days: 43
Plan Fractions Treated to Date: 31
Plan Prescribed Dose Per Fraction: 2 Gy
Plan Total Fractions Prescribed: 35
Plan Total Prescribed Dose: 70 Gy
Reference Point Dosage Given to Date: 62 Gy
Reference Point Session Dosage Given: 2 Gy
Session Number: 31

## 2023-07-05 ENCOUNTER — Other Ambulatory Visit: Payer: Self-pay

## 2023-07-05 ENCOUNTER — Ambulatory Visit
Admission: RE | Admit: 2023-07-05 | Discharge: 2023-07-05 | Disposition: A | Discharge status: Home / Self Care | Payer: Medicare Other | Source: Ambulatory Visit | Attending: Radiation Oncology | Admitting: Radiation Oncology

## 2023-07-05 DIAGNOSIS — C320 Malignant neoplasm of glottis: Secondary | ICD-10-CM | POA: Diagnosis not present

## 2023-07-05 DIAGNOSIS — Z510 Encounter for antineoplastic radiation therapy: Secondary | ICD-10-CM | POA: Diagnosis not present

## 2023-07-05 DIAGNOSIS — C329 Malignant neoplasm of larynx, unspecified: Secondary | ICD-10-CM | POA: Diagnosis not present

## 2023-07-05 DIAGNOSIS — C321 Malignant neoplasm of supraglottis: Secondary | ICD-10-CM | POA: Diagnosis not present

## 2023-07-05 LAB — RAD ONC ARIA SESSION SUMMARY
Course Elapsed Days: 44
Plan Fractions Treated to Date: 32
Plan Prescribed Dose Per Fraction: 2 Gy
Plan Total Fractions Prescribed: 35
Plan Total Prescribed Dose: 70 Gy
Reference Point Dosage Given to Date: 64 Gy
Reference Point Session Dosage Given: 2 Gy
Session Number: 32

## 2023-07-08 ENCOUNTER — Ambulatory Visit
Admission: RE | Admit: 2023-07-08 | Discharge: 2023-07-08 | Disposition: A | Discharge status: Home / Self Care | Payer: Medicare Other | Source: Ambulatory Visit | Attending: Radiation Oncology

## 2023-07-08 ENCOUNTER — Other Ambulatory Visit: Payer: Self-pay

## 2023-07-08 ENCOUNTER — Telehealth: Payer: Self-pay

## 2023-07-08 ENCOUNTER — Ambulatory Visit
Admission: RE | Admit: 2023-07-08 | Discharge: 2023-07-08 | Disposition: A | Discharge status: Home / Self Care | Payer: Medicare Other | Source: Ambulatory Visit | Attending: Radiation Oncology | Admitting: Radiation Oncology

## 2023-07-08 DIAGNOSIS — Z510 Encounter for antineoplastic radiation therapy: Secondary | ICD-10-CM | POA: Diagnosis not present

## 2023-07-08 DIAGNOSIS — C321 Malignant neoplasm of supraglottis: Secondary | ICD-10-CM | POA: Diagnosis not present

## 2023-07-08 DIAGNOSIS — C329 Malignant neoplasm of larynx, unspecified: Secondary | ICD-10-CM | POA: Diagnosis not present

## 2023-07-08 DIAGNOSIS — C320 Malignant neoplasm of glottis: Secondary | ICD-10-CM | POA: Diagnosis not present

## 2023-07-08 LAB — RAD ONC ARIA SESSION SUMMARY
Course Elapsed Days: 47
Plan Fractions Treated to Date: 33
Plan Prescribed Dose Per Fraction: 2 Gy
Plan Total Fractions Prescribed: 35
Plan Total Prescribed Dose: 70 Gy
Reference Point Dosage Given to Date: 66 Gy
Reference Point Session Dosage Given: 2 Gy
Session Number: 33

## 2023-07-08 NOTE — Telephone Encounter (Signed)
Transition Care Management Unsuccessful Follow-up Telephone Call  Date of discharge and from where:  Marie Johnson 9/18  Attempts:  1st Attempt  Reason for unsuccessful TCM follow-up call:  No answer/busy   [name] [place]  [facility], [facility] Guide, Phone: [phone] Website: [email or web address]

## 2023-07-09 ENCOUNTER — Ambulatory Visit: Payer: Medicare Other

## 2023-07-09 ENCOUNTER — Inpatient Hospital Stay: Payer: Medicare Other | Admitting: Dietician

## 2023-07-09 ENCOUNTER — Telehealth: Payer: Self-pay

## 2023-07-09 ENCOUNTER — Other Ambulatory Visit: Payer: Self-pay

## 2023-07-09 ENCOUNTER — Ambulatory Visit
Admission: RE | Admit: 2023-07-09 | Discharge: 2023-07-09 | Disposition: A | Discharge status: Home / Self Care | Payer: Medicare Other | Source: Ambulatory Visit | Attending: Radiation Oncology | Admitting: Radiation Oncology

## 2023-07-09 DIAGNOSIS — C320 Malignant neoplasm of glottis: Secondary | ICD-10-CM | POA: Diagnosis not present

## 2023-07-09 DIAGNOSIS — C321 Malignant neoplasm of supraglottis: Secondary | ICD-10-CM | POA: Diagnosis not present

## 2023-07-09 DIAGNOSIS — Z510 Encounter for antineoplastic radiation therapy: Secondary | ICD-10-CM | POA: Diagnosis not present

## 2023-07-09 DIAGNOSIS — C329 Malignant neoplasm of larynx, unspecified: Secondary | ICD-10-CM | POA: Diagnosis not present

## 2023-07-09 LAB — RAD ONC ARIA SESSION SUMMARY
Course Elapsed Days: 48
Plan Fractions Treated to Date: 34
Plan Prescribed Dose Per Fraction: 2 Gy
Plan Total Fractions Prescribed: 35
Plan Total Prescribed Dose: 70 Gy
Reference Point Dosage Given to Date: 68 Gy
Reference Point Session Dosage Given: 2 Gy
Session Number: 34

## 2023-07-09 NOTE — Progress Notes (Signed)
Nutrition Follow-up:  Patient with laryngeal cancer. She will complete final radiation 10/23  Met with pt and daughter following therapy. She reports increased sore throat and pain with swallowing. Patient has not been drinking much Ensure as this is thick and hurts to swallow. She is doing baking soda salt water rinses. Per daughter, patient has been eating well.    Medications: reviewed   Labs: no new labs  Anthropometrics: Last wt (aria) 137.4 lb on 10/21 - stable  10/14 - 138.8 lb 10/4 - 139.6 lb  9/30 - 137.2 lb  9/16 - 133.2 lb 9/9 - 137.4 lb 9/5 - 133.8 lb   NUTRITION DIAGNOSIS: Food and nutrition related knowledge deficit improving    INTERVENTION:  Educated on ongoing increased energy intake post treatment to support healing Suggested cutting Ensure with milk or pouring over ice for thinner consistency    MONITORING, EVALUATION, GOAL: wt trends, intake   NEXT VISIT: Wednesday November 6 in radiation clinic

## 2023-07-09 NOTE — Telephone Encounter (Signed)
Transition Care Management Unsuccessful Follow-up Telephone Call  Date of discharge and from where:  [name] 9/17  Attempts:  2nd Attempt  Reason for unsuccessful TCM follow-up call:  No answer/busy   [name] [name]  [facility], [facility] Guide, Phone: [phone] Website: [email or web address]

## 2023-07-10 ENCOUNTER — Other Ambulatory Visit: Payer: Self-pay

## 2023-07-10 ENCOUNTER — Ambulatory Visit
Admission: RE | Admit: 2023-07-10 | Discharge: 2023-07-10 | Disposition: A | Discharge status: Home / Self Care | Payer: Medicare Other | Source: Ambulatory Visit | Attending: Radiation Oncology | Admitting: Radiation Oncology

## 2023-07-10 DIAGNOSIS — C329 Malignant neoplasm of larynx, unspecified: Secondary | ICD-10-CM | POA: Diagnosis not present

## 2023-07-10 DIAGNOSIS — C321 Malignant neoplasm of supraglottis: Secondary | ICD-10-CM | POA: Diagnosis not present

## 2023-07-10 DIAGNOSIS — C320 Malignant neoplasm of glottis: Secondary | ICD-10-CM | POA: Diagnosis not present

## 2023-07-10 DIAGNOSIS — Z510 Encounter for antineoplastic radiation therapy: Secondary | ICD-10-CM | POA: Diagnosis not present

## 2023-07-10 LAB — RAD ONC ARIA SESSION SUMMARY
Course Elapsed Days: 49
Plan Fractions Treated to Date: 35
Plan Prescribed Dose Per Fraction: 2 Gy
Plan Total Fractions Prescribed: 35
Plan Total Prescribed Dose: 70 Gy
Reference Point Dosage Given to Date: 70 Gy
Reference Point Session Dosage Given: 2 Gy
Session Number: 35

## 2023-07-10 NOTE — Progress Notes (Signed)
Ms. 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 She completed treatment on 07-10-23.   Pain issues, if any: Reports constant sharp, stabbing pain around her umbilicus that goes through to her back. Reports that she is only able to take her prescription pain medication which helps decrease the pain slightly. She is worried to eat because of the pain, and reports it may be related to coughing forcefully after her eats/drinks (can feel strangled) to clear her throat  Using a feeding tube?: N/A Weight changes, if any:  Wt Readings from Last 3 Encounters:  07/24/23 131 lb 8 oz (59.6 kg)  06/05/23 130 lb (59 kg)  05/23/23 133 lb 12.8 oz (60.7 kg)   Swallowing issues, if any: Continues to struggle with both liquids and solids. She reports feeling stangled at times which causes her to cough forcefully to clear her throat Smoking or chewing tobacco? None Using fluoride toothpaste daily? Denies any mouth sores or dental concerns Last ENT visit was on: Not since diagnosis Other notable issues, if any: Nothing else of note

## 2023-07-10 NOTE — Progress Notes (Signed)
Oncology Nurse Navigator Documentation   Met with Ms. Kassam and her family after final RT to offer support and to celebrate end of radiation treatment.   Provided verbal/written post-RT guidance: Importance of keeping all follow-up appts, especially those with Nutrition and SLP.  Explained my role as navigator will continue for several more months, encouraged him to call me with needs/concerns.    Hedda Slade RN, BSN, OCN Head & Neck Oncology Nurse Navigator [facility] Phone # [phone]  Fax # [phone]

## 2023-07-11 NOTE — Radiation Completion Notes (Signed)
Patient Name: Marie Johnson, Marie Johnson MRN: [id number] Date of Birth: Feb 03, 1951 Referring Physician: Christia Reading, M.D. Date of Service: 2023-07-11 Radiation Oncologist: Lonie Peak, M.D. 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, N=cN0, M=cM0 Intent: Curative     ==========DELIVERED PLANS==========  First Treatment Date: 2023-05-22 - Last Treatment Date: 2023-07-10   Plan Name: HN_Larynx Site: Larynx Technique: IMRT Mode: Photon Dose Per Fraction: 2 Gy Prescribed Dose (Delivered / Prescribed): 70 Gy / 70 Gy Prescribed Fxs (Delivered / Prescribed): 35 / 35     ==========ON TREATMENT VISIT DATES========== 2023-05-27, 2023-06-03, 2023-06-11, 2023-06-17, 2023-06-21, 2023-07-01, 2023-07-08     ==========UPCOMING VISITS==========       ==========APPENDIX - ON TREATMENT VISIT NOTES==========   See weekly On Treatment Notes in Epic for details.

## 2023-07-12 ENCOUNTER — Telehealth: Payer: Self-pay
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
## 2023-07-13 ENCOUNTER — Other Ambulatory Visit: Payer: Self-pay | Admitting: Physician Assistant

## 2023-07-13 DIAGNOSIS — C329 Malignant neoplasm of larynx, unspecified: Secondary | ICD-10-CM | POA: Diagnosis not present

## 2023-07-13 DIAGNOSIS — J441 Chronic obstructive pulmonary disease with (acute) exacerbation: Secondary | ICD-10-CM | POA: Diagnosis not present

## 2023-07-18 ENCOUNTER — Ambulatory Visit: Payer: Medicare Other | Attending: Radiation Oncology

## 2023-07-24 ENCOUNTER — Ambulatory Visit
Admission: RE | Admit: 2023-07-24 | Discharge: 2023-07-24 | Disposition: A | Discharge status: Home / Self Care | Payer: Medicare Other | Source: Ambulatory Visit | Attending: Radiation Oncology | Admitting: Radiation Oncology

## 2023-07-24 ENCOUNTER — Inpatient Hospital Stay: Payer: Medicare Other | Admitting: Dietician

## 2023-07-24 VITALS — BP 149/77 | HR 88 | Temp 97.7°F | Resp 20 | Ht 67.5 in | Wt 131.5 lb

## 2023-07-24 DIAGNOSIS — R1310 Dysphagia, unspecified: Secondary | ICD-10-CM | POA: Diagnosis not present

## 2023-07-24 DIAGNOSIS — R109 Unspecified abdominal pain: Secondary | ICD-10-CM | POA: Insufficient documentation

## 2023-07-24 DIAGNOSIS — Z923 Personal history of irradiation: Secondary | ICD-10-CM | POA: Insufficient documentation

## 2023-07-24 DIAGNOSIS — C320 Malignant neoplasm of glottis: Secondary | ICD-10-CM

## 2023-07-24 DIAGNOSIS — C321 Malignant neoplasm of supraglottis: Secondary | ICD-10-CM | POA: Diagnosis not present

## 2023-07-24 DIAGNOSIS — Z79899 Other long term (current) drug therapy: Secondary | ICD-10-CM | POA: Insufficient documentation

## 2023-07-24 DIAGNOSIS — Z7902 Long term (current) use of antithrombotics/antiplatelets: Secondary | ICD-10-CM | POA: Diagnosis not present

## 2023-07-24 NOTE — Progress Notes (Signed)
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) Staging form: Larynx - Supraglottis, AJCC 8th Edition - Clinical stage from 05/03/2023: Stage I (cT1, cN0, cM0) - Signed by Lonie Peak, MD on 05/03/2023  Glottis carcinoma [facility]) Staging form: Larynx - Glottis, AJCC 8th Edition - Clinical stage from 05/03/2023: Stage I (cT1, cN0, cM0) - Signed by Lonie Peak, MD on 05/03/2023 Stage prefix: Initial diagnosis  ==========DELIVERED PLANS==========  First Treatment Date: 2023-05-22 - Last Treatment Date: 2023-07-10   Plan Name: HN_Larynx Site: Larynx Technique: IMRT Mode: Photon Dose Per Fraction: 2 Gy Prescribed Dose (Delivered / Prescribed): 70 Gy / 70 Gy Prescribed Fxs (Delivered / Prescribed): 35 / 35  CHIEF COMPLAINT:  Here for follow-up and surveillance of laryngeal cancer  Narrative:  The patient returns today for routine follow-up. She completed her radiation treatment on 07/10/2023.                     Pain issues, if any: Reports constant sharp, stabbing pain around her umbilicus that goes through to her back. Reports that she is only able to take her prescription pain medication which helps decrease the pain slightly. She is worried to eat because of the pain, and reports it may be related to coughing forcefully after her eats/drinks (can feel strangled) to clear her throat. She denies any nausea, constipation, or diarrhea. She had a bowel movement yesterday, but does not remember when her last bowel movement was. She has not passed gas for a couple days. Using a feeding tube?: N/A Weight changes, if any:     Wt Readings from Last 3  Encounters:  07/24/23 131 lb 8 oz (59.6 kg)  06/05/23 130 lb (59 kg)  05/23/23 133 lb 12.8 oz (60.7 kg)  Swallowing issues, if any: Continues to struggle with both liquids and solids. She reports feeling stangled at times which causes her to cough forcefully to clear her throat Smoking or chewing tobacco? None Using fluoride toothpaste daily? Denies any mouth sores or dental concerns Last ENT visit was on: Not since diagnosis Other notable issues, if any: Nothing else of note    ALLERGIES:  is allergic to bee venom, insect extract, farxiga [dapagliflozin], lyrica [pregabalin], neurontin [gabapentin], nsaids, and latex.  Meds: Current Outpatient Medications  Medication Sig Dispense Refill   albuterol (PROVENTIL HFA;VENTOLIN HFA) 108 (90 BASE) MCG/ACT inhaler Inhale 2 puffs into the lungs every 6 (six) hours as needed for wheezing or shortness of breath.     albuterol (PROVENTIL) (2.5 MG/3ML) 0.083% nebulizer solution Take 2.5 mg by nebulization every 6 (six) hours as needed for wheezing or shortness of breath.     alendronate (FOSAMAX) 70 MG tablet Take 70 mg by mouth every Wednesday.     allopurinol (ZYLOPRIM) 300 MG tablet Take 300 mg by mouth every evening.     aspirin EC 81 MG tablet Take 81 mg by mouth in the morning.     atorvastatin (LIPITOR) 80 MG tablet Take 80 mg by mouth at bedtime.     bisacodyl (DULCOLAX) 10 MG suppository Place 1 suppository (  10 mg total) rectally daily as needed for moderate constipation. 12 suppository 0   calcium carbonate (ANTACID) 500 MG chewable tablet Chew 1 tablet by mouth 2 (two) times daily as needed for heartburn or indigestion.     Cholecalciferol (VITAMIN D) 125 MCG (5000 UT) CAPS Take 5,000 Units by mouth in the morning.     clopidogrel (PLAVIX) 75 MG tablet Take 1 tablet (75 mg total) by mouth at bedtime. 90 tablet 0   cyanocobalamin (VITAMIN B12) 1000 MCG tablet Take 1,000 mcg by mouth daily.     EPIPEN 2-PAK 0.3 MG/0.3ML SOAJ injection Inject  0.3 mg into the muscle daily as needed for anaphylaxis. Use as directed as needed for allergies.     ezetimibe (ZETIA) 10 MG tablet Take 1 tablet (10 mg total) by mouth daily. (Patient taking differently: Take 10 mg by mouth at bedtime.) 90 tablet 2   feeding supplement (ENSURE ENLIVE / ENSURE PLUS) LIQD Take 237 mLs by mouth 2 (two) times daily between meals. 237 mL 12   furosemide (LASIX) 40 MG tablet Take one tablet by mouth twice daily on Mondays, Wednesdays, and Fridays, then take one tablet by mouth daily on other days. (Patient taking differently: Take 40 mg by mouth daily.) 45 tablet 3   lactulose (CHRONULAC) 10 GM/15ML solution Take 20 g by mouth 2 (two) times daily as needed for moderate constipation.     leptospermum manuka honey (MEDIHONEY) PSTE paste Apply 1 Application topically daily. 44 mL 0   lidocaine (XYLOCAINE) 2 % solution Patient: Mix 1part 2% viscous lidocaine, 1part H20. Swallow 10mL of diluted mixture, before meals and at bedtime, up to QID (Patient not taking: Reported on 06/05/2023) 200 mL 3   melatonin 5 MG TABS Take 5 mg by mouth at bedtime.     nitroGLYCERIN (NITROSTAT) 0.4 MG SL tablet Place 1 tablet (0.4 mg total) under the tongue every 5 (five) minutes x 3 doses as needed for chest pain. 25 tablet 4   Oxycodone HCl 10 MG TABS Take 1 tablet (10 mg total) by mouth 4 (four) times daily as needed (for pain). 20 tablet 0   pantoprazole (PROTONIX) 40 MG tablet Take 40 mg by mouth 2 (two) times daily.     phenol (CHLORASEPTIC) 1.4 % LIQD Use as directed 1 spray in the mouth or throat as needed for throat irritation / pain. 118 mL 0   polyethylene glycol powder (GLYCOLAX/MIRALAX) 17 GM/SCOOP powder Mix 17 g of powder in 4 oz of water or juice & drink by mouth daily. 238 g 0   potassium chloride SA (KLOR-CON M20) 20 MEQ tablet Take one tablet by mouth twice daily on Mondays, Wednesdays, and Fridays, then take one tablet by mouth daily on other days. (Patient taking  differently: Take 20 mEq by mouth See admin instructions. Take one tablet by mouth twice daily on Mondays, Wednesdays, and Fridays, then take one tablet by mouth daily on other days.) 45 tablet 3   senna-docusate (SENOKOT-S) 8.6-50 MG tablet Take 2 tablets by mouth at bedtime.     sodium chloride (OCEAN) 0.65 % SOLN nasal spray Place 1 spray into both nostrils as needed for congestion. 44 mL 0   SPIRIVA RESPIMAT 2.5 MCG/ACT AERS Take 2 puffs by mouth daily.     spironolactone (ALDACTONE) 25 MG tablet Take 1 tablet (25 mg total) by mouth daily. 90 tablet 3   verapamil (CALAN-SR) 240 MG CR tablet Take 1 tablet (240 mg total) by mouth  daily. 90 tablet 2   No current facility-administered medications for this encounter.   Facility-Administered Medications Ordered in Other Encounters  Medication Dose Route Frequency Provider Last Rate Last Admin   DOBUTamine (DOBUTREX) 1,000 mcg/mL in dextrose 5% 250 mL infusion  30 mcg/kg/min Intravenous Titrated Lars Masson, MD        Physical Findings: The patient is in no acute distress. Patient is alert and oriented. Wt Readings from Last 3 Encounters:  07/24/23 131 lb 8 oz (59.6 kg)  06/05/23 130 lb (59 kg)  05/23/23 133 lb 12.8 oz (60.7 kg)    height is 5' 7.5" (1.715 m) and weight is 131 lb 8 oz (59.6 kg). Her temporal temperature is 97.7 F (36.5 C). Her blood pressure is 149/77 (abnormal) and her pulse is 88. Her respiration is 20 and oxygen saturation is 100%. .  General: Alert and oriented, in no acute distress HEENT: Head is normocephalic. Extraocular movements are intact. Oropharynx is notable for no concerning lesions, erythema to the oropharynx.  Neck: Neck is notable for no palpable masses Skin: Skin in treatment fields is hyperpigmented, no open skin lesions.  Heart: Regular in rate and rhythm with no murmurs, rubs, or gallops. Chest: Clear to auscultation bilaterally, with no rhonchi, wheezes, or rales. Abdomen: Hard, distended, with  tenderness to palpation in the umbilical region. Normoactive bowel sounds.  Extremities: No cyanosis or edema. Lymphatics: see Neck Exam Psychiatric: Judgment and insight are intact. Affect is appropriate.   Lab Findings: Lab Results  Component Value Date   WBC 4.9 06/11/2023   HGB 11.5 (L) 06/11/2023   HCT 35.3 (L) 06/11/2023   MCV 100.0 06/11/2023   PLT 226 06/11/2023    Lab Results  Component Value Date   TSH 0.624 05/10/2023    Radiographic Findings: No results found.  Impression/Plan:    1) Head and Neck Cancer Status: Patient is continuing to heal from the effects of the radiation treatment.   2) Nutritional Status: stable    Wt Readings from Last 3 Encounters:  07/24/23 131 lb 8 oz (59.6 kg)  06/05/23 130 lb (59 kg)  05/23/23 133 lb 12.8 oz (60.7 kg)   PEG tube: None  3) Risk Factors: The patient has been educated about risk factors including alcohol and tobacco abuse; they understand that avoidance of alcohol and tobacco is important to prevent recurrences as well as other cancers  4) Swallowing: patient is having difficulty with swallowing liquids and solids since completing her treatment. She has not seen SLP due to not being able to get to the appointments. Our nurse navigator, Victorino Dike, is working to see find a SLP closer to [place].   5) Dental: Encouraged to continue regular follow-up with dentistry, and dental hygiene including fluoride rinses.   6) Thyroid function: Checking annually Lab Results  Component Value Date   TSH 0.624 05/10/2023    7) Other: Abdominal pain. This pain is likely not from her radiation treatment, but could be secondary to constipation. She was encouraged to use Miralax and gas-X regularly along with light movement and liquid intake. She knows to go to urgent care if pain worsens, but she is scheduled to see her PCP on 07/26/2023 and will bring this issue to her attention.   8) PET scan in 3 months with office visit a couple  days afterwards to follow.  On date of service, in total, I spent 20 minutes on this encounter. Patient was seen in person. _____________________________________   Quitman Livings  Corky Sing, PA-C

## 2023-07-25 ENCOUNTER — Telehealth: Payer: Self-pay | Admitting: Physical Therapy

## 2023-07-25 ENCOUNTER — Ambulatory Visit: Payer: Medicare Other | Admitting: Physical Therapy

## 2023-07-25 DIAGNOSIS — R52 Pain, unspecified: Secondary | ICD-10-CM | POA: Diagnosis not present

## 2023-07-25 DIAGNOSIS — M25551 Pain in right hip: Secondary | ICD-10-CM | POA: Diagnosis not present

## 2023-07-25 DIAGNOSIS — M25552 Pain in left hip: Secondary | ICD-10-CM | POA: Diagnosis not present

## 2023-07-25 DIAGNOSIS — Y842 Radiological procedure and radiotherapy as the cause of abnormal reaction of the patient, or of later complication, without mention of misadventure at the time of the procedure: Secondary | ICD-10-CM | POA: Diagnosis not present

## 2023-07-25 DIAGNOSIS — G8929 Other chronic pain: Secondary | ICD-10-CM | POA: Diagnosis not present

## 2023-07-25 NOTE — Telephone Encounter (Signed)
Called pt regarding missed follow up appointment at PT and call was not answered.  Milagros Loll [place], PT 11/7/242:33 PM

## 2023-07-26 DIAGNOSIS — I5030 Unspecified diastolic (congestive) heart failure: Secondary | ICD-10-CM | POA: Diagnosis not present

## 2023-07-26 DIAGNOSIS — R7303 Prediabetes: Secondary | ICD-10-CM | POA: Diagnosis not present

## 2023-07-26 DIAGNOSIS — I10 Essential (primary) hypertension: Secondary | ICD-10-CM | POA: Diagnosis not present

## 2023-07-26 DIAGNOSIS — E7800 Pure hypercholesterolemia, unspecified: Secondary | ICD-10-CM | POA: Diagnosis not present

## 2023-07-26 DIAGNOSIS — M810 Age-related osteoporosis without current pathological fracture: Secondary | ICD-10-CM | POA: Diagnosis not present

## 2023-07-26 DIAGNOSIS — Z79899 Other long term (current) drug therapy: Secondary | ICD-10-CM | POA: Diagnosis not present

## 2023-07-26 DIAGNOSIS — I7123 Aneurysm of the descending thoracic aorta, without rupture: Secondary | ICD-10-CM | POA: Diagnosis not present

## 2023-07-26 DIAGNOSIS — I700 Atherosclerosis of aorta: Secondary | ICD-10-CM | POA: Diagnosis not present

## 2023-07-26 DIAGNOSIS — C320 Malignant neoplasm of glottis: Secondary | ICD-10-CM | POA: Diagnosis not present

## 2023-07-26 DIAGNOSIS — J432 Centrilobular emphysema: Secondary | ICD-10-CM | POA: Diagnosis not present

## 2023-07-26 DIAGNOSIS — I739 Peripheral vascular disease, unspecified: Secondary | ICD-10-CM | POA: Diagnosis not present

## 2023-07-26 DIAGNOSIS — M109 Gout, unspecified: Secondary | ICD-10-CM | POA: Diagnosis not present

## 2023-08-13 DIAGNOSIS — D225 Melanocytic nevi of trunk: Secondary | ICD-10-CM | POA: Diagnosis not present

## 2023-08-13 DIAGNOSIS — B353 Tinea pedis: Secondary | ICD-10-CM | POA: Diagnosis not present

## 2023-08-13 DIAGNOSIS — D2372 Other benign neoplasm of skin of left lower limb, including hip: Secondary | ICD-10-CM | POA: Diagnosis not present

## 2023-08-13 DIAGNOSIS — L821 Other seborrheic keratosis: Secondary | ICD-10-CM | POA: Diagnosis not present

## 2023-08-13 DIAGNOSIS — J441 Chronic obstructive pulmonary disease with (acute) exacerbation: Secondary | ICD-10-CM | POA: Diagnosis not present

## 2023-08-13 DIAGNOSIS — C329 Malignant neoplasm of larynx, unspecified: Secondary | ICD-10-CM | POA: Diagnosis not present

## 2023-08-14 ENCOUNTER — Other Ambulatory Visit: Payer: Self-pay | Admitting: Cardiovascular Disease

## 2023-08-21 ENCOUNTER — Other Ambulatory Visit: Payer: Self-pay | Admitting: Radiation Oncology

## 2023-08-21 DIAGNOSIS — Z0000 Encounter for general adult medical examination without abnormal findings: Secondary | ICD-10-CM | POA: Diagnosis not present

## 2023-08-21 DIAGNOSIS — R52 Pain, unspecified: Secondary | ICD-10-CM | POA: Diagnosis not present

## 2023-08-21 DIAGNOSIS — M25551 Pain in right hip: Secondary | ICD-10-CM | POA: Diagnosis not present

## 2023-08-21 DIAGNOSIS — M25552 Pain in left hip: Secondary | ICD-10-CM | POA: Diagnosis not present

## 2023-08-21 DIAGNOSIS — C320 Malignant neoplasm of glottis: Secondary | ICD-10-CM

## 2023-08-21 DIAGNOSIS — G8929 Other chronic pain: Secondary | ICD-10-CM | POA: Diagnosis not present

## 2023-08-21 DIAGNOSIS — Y842 Radiological procedure and radiotherapy as the cause of abnormal reaction of the patient, or of later complication, without mention of misadventure at the time of the procedure: Secondary | ICD-10-CM | POA: Diagnosis not present

## 2023-08-23 DIAGNOSIS — R059 Cough, unspecified: Secondary | ICD-10-CM | POA: Diagnosis not present

## 2023-08-23 DIAGNOSIS — J441 Chronic obstructive pulmonary disease with (acute) exacerbation: Secondary | ICD-10-CM | POA: Diagnosis not present

## 2023-08-28 DIAGNOSIS — E871 Hypo-osmolality and hyponatremia: Secondary | ICD-10-CM | POA: Diagnosis not present

## 2023-09-03 ENCOUNTER — Encounter: Payer: Self-pay | Admitting: Pulmonary Disease

## 2023-09-03 ENCOUNTER — Ambulatory Visit: Payer: Medicare Other | Admitting: Pulmonary Disease

## 2023-09-03 VITALS — BP 122/68 | HR 87 | Temp 97.8°F | Ht 67.5 in | Wt 134.6 lb

## 2023-09-03 DIAGNOSIS — J4489 Other specified chronic obstructive pulmonary disease: Secondary | ICD-10-CM

## 2023-09-03 DIAGNOSIS — J439 Emphysema, unspecified: Secondary | ICD-10-CM | POA: Diagnosis not present

## 2023-09-03 MED ORDER — TRELEGY ELLIPTA 100-62.5-25 MCG/ACT IN AEPB: 1.0000 | INHALATION_SPRAY | Freq: Every day | RESPIRATORY_TRACT | 3 refills | Status: DC

## 2023-09-03 MED ORDER — TRELEGY ELLIPTA 100-62.5-25 MCG/ACT IN AEPB: 1.0000 | INHALATION_SPRAY | Freq: Every day | RESPIRATORY_TRACT | Status: DC

## 2023-09-03 NOTE — Progress Notes (Signed)
Synopsis: Referred in by Ailene Ravel, MD   Subjective:   PATIENT ID: Marie Johnson GENDER: female DOB: 1951-09-13, MRN: [id number]  Chief Complaint  Patient presents with   pulmonary consult    Prod cough with clear to green sputum, wheezing and SOB with exertion.     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  She reports that she has been having ongoing shortness of breath on exertion for couple of years associated with cough and sputum production.  She quit smoking a year ago and her cough got worse.  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  She feels that post her diagnosis her breathing got slightly worse.  She was placed on oxygen about 6 months ago when she was admitted to the hospital for a COPD exacerbation  She is enrolled in the lung cancer screening program and her last CT chest was in July 2024 which showed moderate bilateral bronchial wall thickening with scattered areas of mucous plugging.  Severe centrilobular emphysema and bilateral lower lobe atelectasis.  Lungs are hyperinflated.  There is a small right lower lobe nodule measuring 3.8 mm.  Plan is to repeat a CT chest in July 2025.  Family history: No family history of pulmonary disease  Social history: Previous smoker quit 1 year ago smoked 1 to 2 packs/day for 45 years.  ROS All symptoms were reviewed and are negative except for the above. Objective:   Vitals:   09/03/23 1329 09/03/23 1332  BP: 122/68   Pulse: 87   Temp: 97.8 F (36.6 C)   TempSrc: Temporal   SpO2: 93% 93%  Weight: 134 lb 9.6 oz (61.1  kg)   Height: 5' 7.5" (1.715 m)    93% on RA BMI Readings from Last 3 Encounters:  09/03/23 20.77 kg/m  07/24/23 20.29 kg/m  06/05/23 20.06 kg/m   Wt Readings from Last 3 Encounters:  09/03/23 134 lb 9.6 oz (61.1 kg)  07/24/23 131 lb 8 oz (59.6 kg)  06/05/23 130 lb (59 kg)    Physical Exam GEN: NAD, frail HEENT: Supple Neck, Reactive Pupils, EOMI  CVS: Normal S1, Normal S2, RRR, No murmurs or ES appreciated  Lungs: Poor air movement Abdomen: Soft, non tender, non distended, + BS  Extremities: Warm and well perfused, No edema  Skin: No suspicious lesions appreciated  Psych: Normal Affect  Labs and imaging were reviewed  Ancillary Information   CBC    Component Value Date/Time   WBC 4.9 06/11/2023 0549   RBC 3.53 (L) 06/11/2023 0549   HGB 11.5 (L) 06/11/2023 0549   HGB 10.5 (L) 07/01/2020 1141   HCT 35.3 (L) 06/11/2023 0549   HCT 34.4 07/01/2020 1141   PLT 226 06/11/2023 0549   PLT 361 07/01/2020 1141   MCV 100.0 06/11/2023 0549   MCV 77 (L) 07/01/2020 1141   MCH 32.6 06/11/2023 0549   MCHC 32.6 06/11/2023 0549   RDW 20.4 (H) 06/11/2023 0549   RDW 19.9 (H) 07/01/2020 1141   LYMPHSABS 0.8 06/11/2023 0549   LYMPHSABS 2.1 07/01/2020 1141   MONOABS 0.5 06/11/2023 0549   EOSABS 0.5 06/11/2023  0549   EOSABS 0.1 07/01/2020 1141   BASOSABS 0.0 06/11/2023 0549   BASOSABS 0.0 07/01/2020 1141        No data to display           Assessment & Plan:  Marie Johnson is a pleasant 72 year old female patient with a past medical history of heavy tobacco use, 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  # Presumed COPD however no PFTs on file complicated by chronic hypoxic respiratory failure. # CT chest with signs of chronic bronchitis.  []  PFTs []  Will start  umeclidinium-fluticasone-vilanterol [Trelegy Ellipta] 100 1 puff once a day.  Advised mouth rinsing post use []  Continue with albuterol as needed as well as DuoNebs as needed. []  Referral placed for pulmonary rehab []  I will follow up with her in 3 months and might start Azithromycin suppression treatment.  []  Advised on airway clearance regimen.   # History of tobacco use 50 pack/year.  Quit 1 year ago.  Currently enrolled in low-dose CT scan program.  Next CT in 2025 Return in about 3 months (around 12/02/2023).  I spent 60 minutes caring for this patient today, including preparing to see the patient, obtaining a medical history , reviewing a separately obtained history, performing a medically appropriate examination and/or evaluation, counseling and educating the patient/family/caregiver, ordering medications, tests, or procedures, documenting clinical information in the electronic health record, and independently interpreting results (not separately reported/billed) and communicating results to the patient/family/caregiver  Janann Colonel, MD [facility] 09/03/2023 2:22 PM

## 2023-09-05 ENCOUNTER — Institutional Professional Consult (permissible substitution): Payer: Medicare Other | Admitting: Pulmonary Disease

## 2023-09-09 ENCOUNTER — Telehealth: Payer: Self-pay | Admitting: Pulmonary Disease

## 2023-09-09 DIAGNOSIS — R0602 Shortness of breath: Secondary | ICD-10-CM

## 2023-09-09 NOTE — Telephone Encounter (Signed)
Pt calling in about the Trelegy prescribed, it will cost her $500. Pt needs something way more reasonable.

## 2023-09-09 NOTE — Telephone Encounter (Signed)
ATC X2 and received busy signal.   Pharmacy team, will you run a test claim to see if there if a cheaper alternative to trelegy. Thanks

## 2023-09-10 ENCOUNTER — Other Ambulatory Visit (HOSPITAL_COMMUNITY): Payer: Self-pay

## 2023-09-10 NOTE — Telephone Encounter (Signed)
Contacted the patient pharmacy. Price quoted to patient of $519 is for 3 months. Price for 1 month is $173.03. Had pharmacy place script on hold for the time being in case provider would like to change medication. Listed below are the prices of other inhalers. It also looks like some of the higher co-pays are being attributed to a coverage gap/donut hole. Please let me know if there is not one listed you would like the price for. Thanks! (All test claim prices are for 1 month) Please keep in mind that patient has government based insurance / Medicare and is not able to use manufacturer coupons, thanks!   Triple therapy:  Trelegy - $173.03 Breztri - $162.28  ICS+LABA Advair HFA - $80.31  Generic non-formulary Advair Diskus - $66.84  Generic - $39.47 Wixela Inhub - $20.63 Breo Ellipta - $102.50  Dulera - $86.27 Symbicort - $59.05  Generic non-formulary  LAMA Incruse Ellipta - $89.16 Spiriva Handihaler - $128.84  Generic non-formulary Spiriva Respimat - $132.70 Tudorza non-formulary  LABA Striverdi non-formulary  ICS Alvesco non-formulary Arnuity Ellipta - $52.57 Asmanex HFA non-formulary Fluticasone HFA non-formulary Pulmicort Flexhaler non-formulary Qvar Redihaler - $53.88

## 2023-09-11 NOTE — Telephone Encounter (Signed)
JP, please see below message and advise. Thanks

## 2023-09-20 DIAGNOSIS — M25551 Pain in right hip: Secondary | ICD-10-CM | POA: Diagnosis not present

## 2023-09-20 DIAGNOSIS — R030 Elevated blood-pressure reading, without diagnosis of hypertension: Secondary | ICD-10-CM | POA: Diagnosis not present

## 2023-09-20 DIAGNOSIS — R52 Pain, unspecified: Secondary | ICD-10-CM | POA: Diagnosis not present

## 2023-09-20 DIAGNOSIS — G8929 Other chronic pain: Secondary | ICD-10-CM | POA: Diagnosis not present

## 2023-09-20 DIAGNOSIS — M25552 Pain in left hip: Secondary | ICD-10-CM | POA: Diagnosis not present

## 2023-09-20 DIAGNOSIS — Y842 Radiological procedure and radiotherapy as the cause of abnormal reaction of the patient, or of later complication, without mention of misadventure at the time of the procedure: Secondary | ICD-10-CM | POA: Diagnosis not present

## 2023-09-20 DIAGNOSIS — I739 Peripheral vascular disease, unspecified: Secondary | ICD-10-CM | POA: Diagnosis not present

## 2023-09-24 ENCOUNTER — Other Ambulatory Visit (HOSPITAL_COMMUNITY): Payer: Self-pay

## 2023-09-24 NOTE — Telephone Encounter (Signed)
 Test claims result in the following:   Brovana  - (Generic) $24.33     Brand non-formulary Budesonide  -  0.5mg  $12.64    0.25mg  $13.34     1mg  $27.38Pulmicort  -   0.5mg  $61.93       0.25mg  $52.64    1mg  $123.71Yupelri  - $316.65    Yupelri  result shows to run with Part B, neb at non-[facility]

## 2023-09-24 NOTE — Telephone Encounter (Signed)
 Patient checking on message for inhalers. Patient phone number is [phone].

## 2023-09-26 MED ORDER — FLUTICASONE-SALMETEROL 250-50 MCG/ACT IN AEPB: 1.0000 | INHALATION_SPRAY | Freq: Two times a day (BID) | RESPIRATORY_TRACT | 3 refills | Status: DC

## 2023-09-26 NOTE — Telephone Encounter (Signed)
 Pt calling in bc she still has not heard anything since her last call she needs something sent in for her besides the trelegy

## 2023-09-27 NOTE — Telephone Encounter (Signed)
 I have notified the patient. Nothing further needed.

## 2023-09-27 NOTE — Telephone Encounter (Signed)
 ATC the patient. I did not get an answer or a voicemail. I will try again later.

## 2023-10-01 ENCOUNTER — Ambulatory Visit (HOSPITAL_COMMUNITY)
Admission: RE | Admit: 2023-10-01 | Payer: Medicare Other | Source: Ambulatory Visit | Attending: Cardiology | Admitting: Cardiology

## 2023-10-04 ENCOUNTER — Ambulatory Visit (HOSPITAL_COMMUNITY)
Admission: RE | Admit: 2023-10-04 | Discharge: 2023-10-04 | Disposition: A | Discharge status: Home / Self Care | Payer: Medicare Other | Source: Ambulatory Visit | Attending: Cardiology | Admitting: Cardiology

## 2023-10-04 DIAGNOSIS — I6523 Occlusion and stenosis of bilateral carotid arteries: Secondary | ICD-10-CM | POA: Diagnosis not present

## 2023-10-04 DIAGNOSIS — I739 Peripheral vascular disease, unspecified: Secondary | ICD-10-CM

## 2023-10-04 DIAGNOSIS — Z720 Tobacco use: Secondary | ICD-10-CM | POA: Diagnosis not present

## 2023-10-04 DIAGNOSIS — J42 Unspecified chronic bronchitis: Secondary | ICD-10-CM | POA: Diagnosis not present

## 2023-10-10 ENCOUNTER — Other Ambulatory Visit: Payer: Self-pay | Admitting: Vascular Surgery

## 2023-10-11 ENCOUNTER — Encounter (HOSPITAL_COMMUNITY)
Admission: RE | Admit: 2023-10-11 | Discharge: 2023-10-11 | Disposition: A | Discharge status: Home / Self Care | Payer: Medicare Other | Source: Ambulatory Visit | Attending: Radiology | Admitting: Radiology

## 2023-10-11 ENCOUNTER — Other Ambulatory Visit: Payer: Self-pay

## 2023-10-11 DIAGNOSIS — C320 Malignant neoplasm of glottis: Secondary | ICD-10-CM | POA: Diagnosis not present

## 2023-10-11 DIAGNOSIS — I77812 Thoracoabdominal aortic ectasia: Secondary | ICD-10-CM | POA: Diagnosis not present

## 2023-10-11 DIAGNOSIS — I2510 Atherosclerotic heart disease of native coronary artery without angina pectoris: Secondary | ICD-10-CM | POA: Diagnosis not present

## 2023-10-11 LAB — GLUCOSE, CAPILLARY: Glucose-Capillary: 84 mg/dL (ref 70–99)

## 2023-10-11 MED ORDER — FLUDEOXYGLUCOSE F - 18 (FDG) INJECTION
6.6000 | Freq: Once | INTRAVENOUS | Status: AC | PRN
  Administered 2023-10-11: 6.6 via INTRAVENOUS

## 2023-10-11 MED ORDER — FLUDEOXYGLUCOSE F - 18 (FDG) INJECTION: 6.5000 | Freq: Once | INTRAVENOUS | Status: DC | PRN

## 2023-10-14 ENCOUNTER — Other Ambulatory Visit: Payer: Self-pay

## 2023-10-14 NOTE — Progress Notes (Signed)
Ms. Marie Johnson presents today for follow-up after completing radiation to her larynx on 07/10/2023 and to review PET scan results from 10/11/2023  Pain issues, if any: None Using a feeding tube?: N/A Weight changes, if any: None Swallowing issues, if any: None,  Smoking or chewing tobacco? None Using fluoride toothpaste daily? Yes Last ENT visit was on: Not since diagnosis  Other notable issues, if any: None  BP (!) 151/77 (BP Location: Right Arm, Patient Position: Sitting, Cuff Size: Normal)   Pulse (!) 114   Temp 98.1 F (36.7 C)   Resp 20   Ht 5' 7.5" (1.715 m)   Wt 135 lb 12.8 oz (61.6 kg)   SpO2 97%   PF (!) 2 L/min   BMI 20.96 kg/m     Filed Weights   10/15/23 1547  Weight: 135 lb 12.8 oz (61.6 kg)

## 2023-10-14 NOTE — Telephone Encounter (Signed)
Called patient to remind her of her appointment tomorrow on 1/28 at 3:20 pm.

## 2023-10-15 ENCOUNTER — Ambulatory Visit
Admission: RE | Admit: 2023-10-15 | Discharge: 2023-10-15 | Disposition: A | Discharge status: Home / Self Care | Payer: Medicare Other | Source: Ambulatory Visit | Attending: Radiation Oncology | Admitting: Radiation Oncology

## 2023-10-15 VITALS — BP 151/77 | HR 114 | Temp 98.1°F | Resp 20 | Ht 67.5 in | Wt 135.8 lb

## 2023-10-15 DIAGNOSIS — F17210 Nicotine dependence, cigarettes, uncomplicated: Secondary | ICD-10-CM | POA: Insufficient documentation

## 2023-10-15 DIAGNOSIS — Z7902 Long term (current) use of antithrombotics/antiplatelets: Secondary | ICD-10-CM | POA: Diagnosis not present

## 2023-10-15 DIAGNOSIS — I10 Essential (primary) hypertension: Secondary | ICD-10-CM | POA: Diagnosis not present

## 2023-10-15 DIAGNOSIS — Z7951 Long term (current) use of inhaled steroids: Secondary | ICD-10-CM | POA: Insufficient documentation

## 2023-10-15 DIAGNOSIS — J432 Centrilobular emphysema: Secondary | ICD-10-CM | POA: Diagnosis not present

## 2023-10-15 DIAGNOSIS — E785 Hyperlipidemia, unspecified: Secondary | ICD-10-CM | POA: Insufficient documentation

## 2023-10-15 DIAGNOSIS — I2510 Atherosclerotic heart disease of native coronary artery without angina pectoris: Secondary | ICD-10-CM | POA: Diagnosis not present

## 2023-10-15 DIAGNOSIS — Z79899 Other long term (current) drug therapy: Secondary | ICD-10-CM | POA: Insufficient documentation

## 2023-10-15 DIAGNOSIS — C321 Malignant neoplasm of supraglottis: Secondary | ICD-10-CM

## 2023-10-15 MED ORDER — OXYMETAZOLINE HCL 0.05 % NA SOLN
1.0000 | Freq: Once | NASAL | Status: AC
  Administered 2023-10-15: 1 via NASAL
  Filled 2023-10-15: qty 30

## 2023-10-15 NOTE — Progress Notes (Signed)
Radiation Oncology         [phone]) [phone] ________________________________  Name: Marie Johnson MRN: [id number]  Date: 10/15/2023  DOB: [birth year]  Follow-Up Visit Note  CC: Hamrick, Durward Fortes, MD  Hamrick, Durward Fortes, MD  Diagnosis and Prior Radiotherapy:     No diagnosis found.    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) Staging form: Larynx - Glottis, AJCC 8th Edition - Clinical stage from 05/03/2023: Stage I (cT1, cN0, cM0) - Signed by Lonie Peak, MD on 05/03/2023 Stage prefix: Initial diagnosis  ==========DELIVERED PLANS==========  First Treatment Date: 2023-05-22 - Last Treatment Date: 2023-07-10   Plan Name: HN_Larynx Site: Larynx Technique: IMRT Mode: Photon Dose Per Fraction: 2 Gy Prescribed Dose (Delivered / Prescribed): 70 Gy / 70 Gy Prescribed Fxs (Delivered / Prescribed): 35 / 35  CHIEF COMPLAINT:  Here for follow-up and surveillance of laryngeal cancer  Narrative:  The patient returns today for routine follow-up. She completed her radiation treatment on 07/10/2023.                    ***    ALLERGIES:  is allergic to bee venom, insect extract, farxiga [dapagliflozin], lyrica [pregabalin], neurontin [gabapentin], nsaids, and latex.  Meds: Current Outpatient Medications  Medication Sig Dispense Refill   albuterol (PROVENTIL HFA;VENTOLIN HFA) 108 (90 BASE) MCG/ACT inhaler Inhale 2 puffs into the lungs every 6 (six) hours as needed for wheezing or shortness of breath.     albuterol (PROVENTIL) (2.5 MG/3ML) 0.083% nebulizer solution Take 2.5 mg by nebulization every 6 (six) hours as needed for wheezing or shortness of breath.     alendronate (FOSAMAX) 70 MG tablet Take 70 mg by mouth every Wednesday.     allopurinol (ZYLOPRIM) 300 MG tablet Take 300  mg by mouth every evening.     aspirin EC 81 MG tablet Take 81 mg by mouth in the morning.     atorvastatin (LIPITOR) 80 MG tablet Take 80 mg by mouth at bedtime.     bisacodyl (DULCOLAX) 10 MG suppository Place 1 suppository (10 mg total) rectally daily as needed for moderate constipation. 12 suppository 0   calcium carbonate (ANTACID) 500 MG chewable tablet Chew 1 tablet by mouth 2 (two) times daily as needed for heartburn or indigestion.     Cholecalciferol (VITAMIN D) 125 MCG (5000 UT) CAPS Take 5,000 Units by mouth in the morning.     clopidogrel (PLAVIX) 75 MG tablet TAKE 1 TABLET BY MOUTH AT BEDTIME. 90 tablet 0   cyanocobalamin (VITAMIN B12) 1000 MCG tablet Take 1,000 mcg by mouth daily. (Patient not taking: Reported on 09/03/2023)     EPIPEN 2-PAK 0.3 MG/0.3ML SOAJ injection Inject 0.3 mg into the muscle daily as needed for anaphylaxis. Use as directed as needed for allergies.     ezetimibe (ZETIA) 10 MG tablet Take 1 tablet (10 mg total) by mouth daily. (Patient taking differently: Take 10 mg by mouth at bedtime.) 90 tablet 2   feeding supplement (ENSURE ENLIVE / ENSURE PLUS) LIQD Take 237 mLs by mouth 2 (two) times daily between meals. 237 mL 12   fluticasone-salmeterol (WIXELA INHUB) 250-50 MCG/ACT AEPB Inhale 1 puff into the lungs in the morning and at bedtime. 60 each 3   Fluticasone-Umeclidin-Vilant (TRELEGY ELLIPTA) 100-62.5-25 MCG/ACT AEPB Inhale  1 puff into the lungs daily. 3 each 3   Fluticasone-Umeclidin-Vilant (TRELEGY ELLIPTA) 100-62.5-25 MCG/ACT AEPB Inhale 1 puff into the lungs daily.     furosemide (LASIX) 40 MG tablet Take one tablet by mouth twice daily on Mondays, Wednesdays, and Fridays, then take one tablet by mouth daily on other days. (Patient taking differently: Take 40 mg by mouth daily.) 45 tablet 3   lactulose (CHRONULAC) 10 GM/15ML solution Take 20 g by mouth 2 (two) times daily as needed for moderate constipation.     leptospermum manuka honey (MEDIHONEY) PSTE  paste Apply 1 Application topically daily. 44 mL 0   lidocaine (XYLOCAINE) 2 % solution PLEASE SEE ATTACHED FOR DETAILED DIRECTIONS 200 mL 3   melatonin 5 MG TABS Take 5 mg by mouth at bedtime.     nitroGLYCERIN (NITROSTAT) 0.4 MG SL tablet Place 1 tablet (0.4 mg total) under the tongue every 5 (five) minutes x 3 doses as needed for chest pain. 25 tablet 4   Oxycodone HCl 10 MG TABS Take 1 tablet (10 mg total) by mouth 4 (four) times daily as needed (for pain). 20 tablet 0   pantoprazole (PROTONIX) 40 MG tablet Take 40 mg by mouth 2 (two) times daily.     phenol (CHLORASEPTIC) 1.4 % LIQD Use as directed 1 spray in the mouth or throat as needed for throat irritation / pain. 118 mL 0   polyethylene glycol powder (GLYCOLAX/MIRALAX) 17 GM/SCOOP powder Mix 17 g of powder in 4 oz of water or juice & drink by mouth daily. 238 g 0   potassium chloride SA (KLOR-CON M20) 20 MEQ tablet Take one tablet by mouth twice daily on Mondays, Wednesdays, and Fridays, then take one tablet by mouth daily on other days. (Patient taking differently: Take 20 mEq by mouth See admin instructions. Take one tablet by mouth twice daily on Mondays, Wednesdays, and Fridays, then take one tablet by mouth daily on other days.) 45 tablet 3   senna-docusate (SENOKOT-S) 8.6-50 MG tablet Take 2 tablets by mouth at bedtime.     sodium chloride (OCEAN) 0.65 % SOLN nasal spray Place 1 spray into both nostrils as needed for congestion. 44 mL 0   SPIRIVA RESPIMAT 2.5 MCG/ACT AERS Take 2 puffs by mouth daily.     spironolactone (ALDACTONE) 25 MG tablet Take 1 tablet (25 mg total) by mouth daily. 90 tablet 3   verapamil (CALAN-SR) 240 MG CR tablet Take 1 tablet (240 mg total) by mouth daily. 90 tablet 2   No current facility-administered medications for this visit.   Facility-Administered Medications Ordered in Other Visits  Medication Dose Route Frequency Provider Last Rate Last Admin   DOBUTamine (DOBUTREX) 1,000 mcg/mL in dextrose 5% 250  mL infusion  30 mcg/kg/min Intravenous Titrated Lars Masson, MD       fludeoxyglucose F - 18 (FDG) injection 6.5 millicurie  6.5 millicurie Intravenous Once PRN Marin Roberts, MD        Physical Findings: The patient is in no acute distress. Patient is alert and oriented. *** Wt Readings from Last 3 Encounters:  09/03/23 134 lb 9.6 oz (61.1 kg)  07/24/23 131 lb 8 oz (59.6 kg)  06/05/23 130 lb (59 kg)    vitals were not taken for this visit. .  General: Alert and oriented, in no acute distress HEENT: Head is normocephalic. Extraocular movements are intact. Oropharynx is notable for no concerning lesions, erythema to the oropharynx.  Neck: Neck is notable for no palpable  masses Skin: Skin in treatment fields is hyperpigmented, no open skin lesions.  Heart: Regular in rate and rhythm with no murmurs, rubs, or gallops. Chest: Clear to auscultation bilaterally, with no rhonchi, wheezes, or rales. Abdomen: Hard, distended, with tenderness to palpation in the umbilical region. Normoactive bowel sounds.  Extremities: No cyanosis or edema. Lymphatics: see Neck Exam Psychiatric: Judgment and insight are intact. Affect is appropriate.   Lab Findings: Lab Results  Component Value Date   WBC 4.9 06/11/2023   HGB 11.5 (L) 06/11/2023   HCT 35.3 (L) 06/11/2023   MCV 100.0 06/11/2023   PLT 226 06/11/2023    Lab Results  Component Value Date   TSH 0.624 05/10/2023    Radiographic Findings: NM PET Image Restag (PS) Skull Base To Thigh Result Date: 10/14/2023 CLINICAL DATA:  Subsequent treatment strategy for glottic carcinoma. Status post radiation therapy. EXAM: NUCLEAR MEDICINE PET SKULL BASE TO THIGH TECHNIQUE: 6.5 mCi F-18 FDG was injected intravenously. Full-ring PET imaging was performed from the skull base to thigh after the radiotracer. CT data was obtained and used for attenuation correction and anatomic localization. Fasting blood glucose: 84 mg/dl COMPARISON:  96/12/5407  FINDINGS: Mediastinal blood pool activity: SUV max 1.9 Liver activity: SUV max NA NECK: No cervical nodal hypermetabolism. Left-greater-than-right posterior laryngeal activity without well-defined mass. Example at a S.U.V. max of 6.7 today versus a S.U.V. max of 7.9 on the prior exam. Hypermetabolism within left-sided cervical muscles is most likely due to motion after radiopharmaceutical injection and/or myositis from radiation. Incidental CT findings: Bilateral carotid atherosclerosis. No cervical adenopathy. CHEST: No thoracic nodal hypermetabolism. Scattered new hypermetabolic ill-defined pulmonary nodules. Example at 6 mm and a S.U.V. max of 1.5 within the left upper lobe on 62/4. Within the more anterior and inferior left upper lobe at 7 mm and a S.U.V. max of 1.5 on 71/4. Within the posterior right upper lobe at 1.0 cm and a S.U.V. max of 2.1 on 77/4. The previously described inferior lingular hypermetabolic soft tissue density is increased, extending more anteriorly today including on 82/4. Incidental CT findings: Advanced aortic and branch vessel atherosclerosis. Mild cardiomegaly Multivessel coronary artery atherosclerosis. Tortuous descending thoracic aorta with similar dilatation of the aorta at the thoracoabdominal junction. 4.1 cm on 104/4. Centrilobular emphysema.  Left lower lobe calcified granuloma. ABDOMEN/PELVIS: No abdominopelvic parenchymal or nodal hypermetabolism. Incidental CT findings: Normal adrenal glands. Bilateral renal vascular calcifications. Old granulomatous disease in the spleen. Mild left hepatic lobe intrahepatic duct dilatation, similar and nonspecific including on 109/4. Degraded evaluation of the pelvis, secondary to beam hardening artifact from bilateral hip arthroplasties. SKELETON: No abnormal marrow activity. Incidental CT findings: Mild osteopenia. IMPRESSION: 1. Asymmetric posterior laryngeal activity is decreased, and could be physiologic or represent residual non  masslike primary. 2. No cervical nodal or distant hypermetabolic metastasis. 3. Development of scattered bilateral ill-defined hypermetabolic pulmonary nodules. Morphology and distribution favor infectious etiology. 4. Aortic atherosclerosis (ICD10-I70.0), coronary artery atherosclerosis and emphysema (ICD10-J43.9). Thoracoabdominal aortic junction dilatation, similar. Recommend attention on follow-up. Electronically Signed   By: Jeronimo Greaves M.D.   On: 10/14/2023 10:02   VAS US CAROTID Result Date: 10/07/2023 Carotid Arterial Duplex Study Patient Name:  SHALAYA SWAILES  Date of Exam:   10/04/2023 Medical Rec #: [id number]       Accession #:    [id number] Date of Birth: 12/18/1950       Patient Gender: F Patient Age:   60 years Exam Location:  [facility] Procedure:  VAS US CAROTID Referring Phys: HEATHER PEMBERTON --------------------------------------------------------------------------------  Indications:       Carotid artery disease. Risk Factors:      Hypertension, hyperlipidemia, current smoker, coronary artery                    disease. Limitations        Today's exam was limited due to the patient's respiratory                    variation. Comparison Study:  05/11/21 Carotid artery duplex- Right= 1-39% ICA stenosis.                    >50% ECA stenosis. Left= 1-39% ICA stenosis, >50% ECA                    stenosis. Performing Technologist: Gertie Fey MHA, RDMS, RVT, RDCS  Examination Guidelines: A complete evaluation includes B-mode imaging, spectral Doppler, color Doppler, and power Doppler as needed of all accessible portions of each vessel. Bilateral testing is considered an integral part of a complete examination. Limited examinations for reoccurring indications may be performed as noted.  Right Carotid Findings: +----------+--------+--------+--------+-------------------------+--------+           PSV cm/sEDV cm/sStenosisPlaque Description       Comments  +----------+--------+--------+--------+-------------------------+--------+ CCA Prox  75      12              calcific                          +----------+--------+--------+--------+-------------------------+--------+ CCA Distal100     19              heterogenous                      +----------+--------+--------+--------+-------------------------+--------+ ICA Prox  123     19      1-39%   heterogenous and calcific         +----------+--------+--------+--------+-------------------------+--------+ ICA Mid   92      23              heterogenous                      +----------+--------+--------+--------+-------------------------+--------+ ICA Distal72      13              heterogenous                      +----------+--------+--------+--------+-------------------------+--------+ ECA       420     23      >50%    heterogenous and calcific         +----------+--------+--------+--------+-------------------------+--------+ +----------+--------+-------+---------+-------------------+           PSV cm/sEDV cmsDescribe Arm Pressure (mmHG) +----------+--------+-------+---------+-------------------+ Subclavian250            Turbulent                    +----------+--------+-------+---------+-------------------+ +---------+--------+--+--------+-+---------+ VertebralPSV cm/s35EDV cm/s7Antegrade +---------+--------+--+--------+-+---------+  Left Carotid Findings: +----------+--------+--------+--------+-------------------------+--------+           PSV cm/sEDV cm/sStenosisPlaque Description       Comments +----------+--------+--------+--------+-------------------------+--------+ CCA Prox  87      13              heterogenous                      +----------+--------+--------+--------+-------------------------+--------+  CCA Distal90      20              heterogenous and calcific          +----------+--------+--------+--------+-------------------------+--------+ ICA Prox  70      17              heterogenous                      +----------+--------+--------+--------+-------------------------+--------+ ICA Mid   77      19              heterogenous                      +----------+--------+--------+--------+-------------------------+--------+ ICA Distal82      19                                                +----------+--------+--------+--------+-------------------------+--------+ ECA       319     10      >50%    heterogenous                      +----------+--------+--------+--------+-------------------------+--------+ +----------+--------+--------+----------------+-------------------+           PSV cm/sEDV cm/sDescribe        Arm Pressure (mmHG) +----------+--------+--------+----------------+-------------------+ [id number]             Multiphasic, WNL                    +----------+--------+--------+----------------+-------------------+ +---------+--------+--+--------+-+---------+ VertebralPSV cm/s35EDV cm/s9Antegrade +---------+--------+--+--------+-+---------+   Summary: Right Carotid: Velocities in the right ICA are consistent with a 1-39% stenosis.                The ECA appears >50% stenosed. Left Carotid: Velocities in the left ICA are consistent with a 1-39% stenosis.               The ECA appears >50% stenosed. Vertebrals:  Bilateral vertebral arteries demonstrate antegrade flow. Subclavians: Right subclavian artery flow was disturbed. Normal flow              hemodynamics were seen in the left subclavian artery. *See table(s) above for measurements and observations.  Electronically signed by Lorine Bears MD on 10/07/2023 at 8:04:06 AM.    Final     Impression/Plan:    1) Head and Neck Cancer Status: Patient is continuing to heal from the effects of the radiation treatment.  ***  2) Nutritional Status: stable ***    Wt Readings  from Last 3 Encounters:  07/24/23 131 lb 8 oz (59.6 kg)  06/05/23 130 lb (59 kg)  05/23/23 133 lb 12.8 oz (60.7 kg)   PEG tube: None ***  3) Risk Factors: The patient has been educated about risk factors including alcohol and tobacco abuse; they understand that avoidance of alcohol and tobacco is important to prevent recurrences as well as other cancers ***  4) Swallowing: patient is having difficulty with swallowing liquids and solids since completing her treatment. She has not seen SLP due to not being able to get to the appointments. Our nurse navigator, Victorino Dike, is working to see find a SLP closer to [place]. ***  5) Dental: Encouraged to continue regular follow-up with dentistry, and dental hygiene including fluoride rinses.  ***  6) Thyroid function: Checking annually ***  Lab Results  Component Value Date   TSH 0.624 05/10/2023    7) Other: Abdominal pain. This pain is likely not from her radiation treatment, but could be secondary to constipation. She was encouraged to use Miralax and gas-X regularly along with light movement and liquid intake. She knows to go to urgent care if pain worsens, but she is scheduled to see her PCP on 07/26/2023 and will bring this issue to her attention. ***  8) PET scan in 3 months with office visit a couple days afterwards to follow.  On date of service, in total, I spent 20 minutes on this encounter. Patient was seen in person. _____________________________________   Joyice Faster, PA-C

## 2023-10-16 ENCOUNTER — Encounter: Payer: Self-pay | Admitting: Radiation Oncology

## 2023-10-16 NOTE — Progress Notes (Signed)
Oncology Nurse Navigator Documentation   Per patient's 10/15/23 post-treatment follow-up with Dr. Basilio Cairo, sent fax to [facility] ENT Scheduling with request Ms. Scullin be contacted and scheduled for routine post-RT follow-up with Dr. Jenne Pane in 2 months.  Notification of successful fax transmission received.   Hedda Slade RN, BSN, OCN Head & Neck Oncology Nurse Navigator [facility] Phone # [phone]  Fax # [phone]

## 2023-10-21 ENCOUNTER — Other Ambulatory Visit: Payer: Self-pay

## 2023-10-21 MED ORDER — FUROSEMIDE 40 MG PO TABS: ORAL_TABLET | ORAL | 2 refills | Status: DC

## 2023-11-13 ENCOUNTER — Other Ambulatory Visit: Payer: Self-pay | Admitting: Cardiovascular Disease

## 2023-11-13 DIAGNOSIS — M791 Myalgia, unspecified site: Secondary | ICD-10-CM

## 2023-11-13 DIAGNOSIS — I2510 Atherosclerotic heart disease of native coronary artery without angina pectoris: Secondary | ICD-10-CM

## 2023-11-13 DIAGNOSIS — I10 Essential (primary) hypertension: Secondary | ICD-10-CM

## 2023-11-13 DIAGNOSIS — E7849 Other hyperlipidemia: Secondary | ICD-10-CM

## 2023-11-13 DIAGNOSIS — I739 Peripheral vascular disease, unspecified: Secondary | ICD-10-CM

## 2023-12-05 ENCOUNTER — Ambulatory Visit: Payer: Medicare Other | Admitting: Pulmonary Disease

## 2023-12-12 ENCOUNTER — Encounter: Payer: Self-pay | Admitting: Pulmonary Disease

## 2023-12-12 ENCOUNTER — Telehealth: Payer: Self-pay

## 2023-12-12 ENCOUNTER — Ambulatory Visit: Admitting: Pulmonary Disease

## 2023-12-12 VITALS — BP 100/52 | HR 78 | Resp 18 | Ht 67.5 in | Wt 135.2 lb

## 2023-12-12 DIAGNOSIS — R0602 Shortness of breath: Secondary | ICD-10-CM

## 2023-12-12 MED ORDER — PREDNISONE 20 MG PO TABS: 20.0000 mg | ORAL_TABLET | Freq: Every day | ORAL | 0 refills | Status: AC

## 2023-12-12 NOTE — Telephone Encounter (Signed)
 Patient was seen in the office today. Dr. Larinda Buttery has ordered Dupixent for the patient. She has signed the form and it has been faxed to the pharmacy team for completion.

## 2023-12-12 NOTE — Progress Notes (Unsigned)
 Synopsis: Referred in by Ailene Ravel, MD   Subjective:   PATIENT ID: Marie Johnson GENDER: female DOB: 12/03/1950, MRN: [id number]  Chief Complaint  Patient presents with  . 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  She reports that she has been having ongoing shortness of breath on exertion for couple of years associated with cough and sputum production.  She quit smoking a year ago and her cough got worse.  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  She feels that post her diagnosis her breathing got slightly worse.  She was placed on oxygen about 6 months ago when she was admitted to the hospital for a COPD exacerbation  She is enrolled in the lung cancer screening program and her last CT chest was in July 2024 which showed moderate bilateral bronchial wall thickening with scattered areas of mucous plugging.  Severe centrilobular emphysema and bilateral lower lobe atelectasis.  Lungs are hyperinflated.  There is a small right lower lobe nodule measuring 3.8 mm.  Plan is to repeat a CT chest in July 2025.  Family history: No family history of pulmonary disease  Social history: Previous smoker quit 1 year ago smoked 1 to 2 packs/day for 45 years.  ROS All symptoms were reviewed and are negative except for the above. Objective:   There were no vitals filed for this visit.    on RA BMI Readings from Last 3 Encounters:  10/15/23 20.96 kg/m  09/03/23 20.77 kg/m  07/24/23 20.29 kg/m   Wt Readings from Last 3 Encounters:  10/15/23 135 lb 12.8 oz (61.6 kg)   09/03/23 134 lb 9.6 oz (61.1 kg)  07/24/23 131 lb 8 oz (59.6 kg)    Physical Exam GEN: NAD, frail HEENT: Supple Neck, Reactive Pupils, EOMI  CVS: Normal S1, Normal S2, RRR, No murmurs or ES appreciated  Lungs: Poor air movement Abdomen: Soft, non tender, non distended, + BS  Extremities: Warm and well perfused, No edema  Skin: No suspicious lesions appreciated  Psych: Normal Affect  Labs and imaging were reviewed  Ancillary Information   CBC    Component Value Date/Time   WBC 4.9 06/11/2023 0549   RBC 3.53 (L) 06/11/2023 0549   HGB 11.5 (L) 06/11/2023 0549   HGB 10.5 (L) 07/01/2020 1141   HCT 35.3 (L) 06/11/2023 0549   HCT 34.4 07/01/2020 1141   PLT 226 06/11/2023 0549   PLT 361 07/01/2020 1141   MCV 100.0 06/11/2023 0549   MCV 77 (L) 07/01/2020 1141   MCH 32.6 06/11/2023 0549   MCHC 32.6 06/11/2023 0549   RDW 20.4 (H) 06/11/2023 0549   RDW 19.9 (H) 07/01/2020 1141   LYMPHSABS 0.8 06/11/2023 0549   LYMPHSABS 2.1 07/01/2020 1141   MONOABS 0.5 06/11/2023 0549   EOSABS 0.5 06/11/2023 0549   EOSABS 0.1 07/01/2020 1141   BASOSABS 0.0 06/11/2023 0549   BASOSABS 0.0 07/01/2020 1141        No data to display           Assessment & Plan:  Marie Johnson is a pleasant 73 year old female patient with  a past medical history of heavy tobacco use, 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  # Presumed COPD however no PFTs on file complicated by chronic hypoxic respiratory failure. # CT chest with signs of chronic bronchitis.  []  PFTs []  Will start umeclidinium-fluticasone-vilanterol [Trelegy Ellipta] 100 1 puff once a day.  Advised mouth rinsing post use []  Continue with albuterol as needed as well as DuoNebs as needed. []  Referral placed for pulmonary rehab []  I will follow up  with her in 3 months and might start Azithromycin suppression treatment.  []  Advised on airway clearance regimen.   # History of tobacco use 50 pack/year.  Quit 1 year ago.  Currently enrolled in low-dose CT scan program.  Next CT in 2025 No follow-ups on file.  I spent 60 minutes caring for this patient today, including preparing to see the patient, obtaining a medical history , reviewing a separately obtained history, performing a medically appropriate examination and/or evaluation, counseling and educating the patient/family/caregiver, ordering medications, tests, or procedures, documenting clinical information in the electronic health record, and independently interpreting results (not separately reported/billed) and communicating results to the patient/family/caregiver  Janann Colonel, MD [facility] 12/12/2023 2:55 PM

## 2023-12-13 ENCOUNTER — Telehealth: Payer: Self-pay | Admitting: Pharmacist

## 2023-12-13 DIAGNOSIS — J439 Emphysema, unspecified: Secondary | ICD-10-CM

## 2023-12-13 MED ORDER — FLUTICASONE-SALMETEROL 500-50 MCG/ACT IN AEPB: 1.0000 | INHALATION_SPRAY | Freq: Two times a day (BID) | RESPIRATORY_TRACT | 3 refills | Status: DC

## 2023-12-13 NOTE — Telephone Encounter (Signed)
 Dupixent new start paperwork in Onbase  Submitted a Prior Authorization request to [facility] for DUPIXENT via CoverMyMeds. Will update once we receive a response.  Key: [id number]

## 2023-12-20 ENCOUNTER — Other Ambulatory Visit (HOSPITAL_COMMUNITY): Payer: Self-pay

## 2023-12-20 NOTE — Telephone Encounter (Signed)
 Received notification from [facility] regarding a prior authorization for DUPIXENT. Authorization has been APPROVED from 12/13/2023 to 06/14/2024. Approval letter sent to scan center.  Per test claim, copay for 28 days supply is $1264.79  Submitted Patient Assistance Application to Dup[employer] MyWay for DUPIXENT along with provider portion, patient portion, PA, medication list, insurance card copy. Will update patient when we receive a response.  Phone #: [phone] Fax #: [phone]

## 2023-12-24 NOTE — Telephone Encounter (Signed)
 See Telephone encounter from 3/28.  Form was received by the pharmacy team.  Nothing further needed.

## 2024-01-16 ENCOUNTER — Other Ambulatory Visit: Payer: Self-pay | Admitting: Vascular Surgery

## 2024-01-20 NOTE — Telephone Encounter (Signed)
 Spoke with patient regarding next steps for Dupixent [employer] patient assistance screening. She will call [employer] tomorrow and express financial hardship with paying for Dupixent through insurance  Geraldene Kleine, PharmD, MPH, BCPS, CPP Clinical Pharmacist (Rheumatology and Pulmonology)

## 2024-01-24 ENCOUNTER — Telehealth: Payer: Self-pay

## 2024-01-24 NOTE — Telephone Encounter (Signed)
 Lmtcb.   Copied from CRM [id number]. Topic: Clinical - Medical Advice >> Jan 23, 2024 11:31 AM Isabell A wrote: Reason for CRM: Patient states she can't sleep for more than 5 hours, wants to confirm if she should still have sleep test done. Requesting to speak with Devra Fontana.

## 2024-01-24 NOTE — Telephone Encounter (Signed)
 Patient reports she did ONO and has returned equipment. Advised that we would wait on results and call her with recommendations from the provider. NFN.

## 2024-01-30 NOTE — Telephone Encounter (Signed)
 Email to be sent to Alba Huddle for case update

## 2024-02-04 ENCOUNTER — Telehealth: Payer: Self-pay | Admitting: Radiation Oncology

## 2024-02-04 NOTE — Telephone Encounter (Signed)
 Patient called stating her appointment with Dr. Tellis Feathers was delayed until today 02/04/24. Dr. Tellis Feathers advised patient to schedule follow up with Dr. Lurena Sally 3 months following his appointment. Patient advised rescheduling follow up to a later date, rescheduled as advised.

## 2024-02-11 ENCOUNTER — Other Ambulatory Visit: Payer: Self-pay

## 2024-02-11 DIAGNOSIS — M791 Myalgia, unspecified site: Secondary | ICD-10-CM

## 2024-02-11 DIAGNOSIS — I10 Essential (primary) hypertension: Secondary | ICD-10-CM

## 2024-02-11 DIAGNOSIS — I2510 Atherosclerotic heart disease of native coronary artery without angina pectoris: Secondary | ICD-10-CM

## 2024-02-11 DIAGNOSIS — I739 Peripheral vascular disease, unspecified: Secondary | ICD-10-CM

## 2024-02-11 DIAGNOSIS — I493 Ventricular premature depolarization: Secondary | ICD-10-CM

## 2024-02-11 DIAGNOSIS — E7849 Other hyperlipidemia: Secondary | ICD-10-CM

## 2024-02-11 MED ORDER — VERAPAMIL HCL ER 240 MG PO TBCR: 240.0000 mg | EXTENDED_RELEASE_TABLET | Freq: Every day | ORAL | 1 refills | Status: DC

## 2024-02-11 MED ORDER — EZETIMIBE 10 MG PO TABS: 10.0000 mg | ORAL_TABLET | Freq: Every day | ORAL | 1 refills | Status: DC

## 2024-02-18 NOTE — Telephone Encounter (Signed)
 Thank you for letting me know

## 2024-02-28 MED ORDER — DUPIXENT 300 MG/2ML ~~LOC~~ SOAJ: 300.0000 mg | SUBCUTANEOUS | 1 refills | Status: DC

## 2024-02-28 NOTE — Telephone Encounter (Signed)
 Spoke with patient - she started Dupixent at home with 600mg  at Day 0 then 300mg  every 14 days. She does not recall first dose date but is due today for another so likely started about a month ago.

## 2024-02-28 NOTE — Addendum Note (Signed)
 Addended by: Thais Fill on: 02/28/2024 11:46 AM   Modules accepted: Orders

## 2024-02-28 NOTE — Telephone Encounter (Signed)
 Per Alba Huddle, pt has been approved for PAP and medication has been shipped. We have not received an approval letter at this time, but if one is obtained it will be sent to scan center for retention. Nothing further needed at this time.

## 2024-03-27 ENCOUNTER — Telehealth: Payer: Self-pay | Admitting: Radiology

## 2024-03-27 NOTE — Telephone Encounter (Signed)
 7/11@ 10:23 am Left voicemail for patient to call our office to be r/s for FU appt 8/19.

## 2024-04-03 ENCOUNTER — Ambulatory Visit: Admitting: Nurse Practitioner

## 2024-04-14 ENCOUNTER — Ambulatory Visit: Payer: Self-pay | Admitting: Radiology

## 2024-04-24 NOTE — Progress Notes (Signed)
 Ms. Bracher is here for a follow up after completing radiation on 07/10/2023   Pain issues, if any: Denies throat pain, experiencing some throat hoarseness  Using a feeding tube?: Denies Weight changes, if any: Patient has gained weight, been  Swallowing issues, if any: Sometimes has issues swallowing certain foods Smoking or chewing tobacco? None Using fluoride toothpaste daily? Yes Last ENT visit was on: 02/04/2024 Other notable issues, if/ any:  None  BP 132/66 (BP Location: Left Arm, Patient Position: Sitting, Cuff Size: Normal)   Pulse (!) 109   Temp 97.8 F (36.6 C)   Resp (!) 24   Ht 5' 7.5 (1.715 m)   Wt 137 lb 9.6 oz (62.4 kg)   SpO2 96%   BMI 21.23 kg/m      Wt Readings from Last 3 Encounters:  05/05/24 137 lb 9.6 oz (62.4 kg)  12/12/23 135 lb 3.2 oz (61.3 kg)  10/15/23 135 lb 12.8 oz (61.6 kg)

## 2024-05-05 ENCOUNTER — Ambulatory Visit
Admission: RE | Admit: 2024-05-05 | Discharge: 2024-05-05 | Disposition: A | Discharge status: Home / Self Care | Source: Ambulatory Visit | Attending: Internal Medicine | Admitting: Internal Medicine

## 2024-05-05 ENCOUNTER — Ambulatory Visit
Admission: RE | Admit: 2024-05-05 | Discharge: 2024-05-05 | Disposition: A | Discharge status: Home / Self Care | Source: Ambulatory Visit | Attending: Radiation Oncology | Admitting: Radiation Oncology

## 2024-05-05 ENCOUNTER — Ambulatory Visit: Admitting: Radiation Oncology

## 2024-05-05 VITALS — BP 132/66 | HR 109 | Temp 97.8°F | Resp 24 | Ht 67.5 in | Wt 137.6 lb

## 2024-05-05 DIAGNOSIS — R5383 Other fatigue: Secondary | ICD-10-CM

## 2024-05-05 DIAGNOSIS — Z7902 Long term (current) use of antithrombotics/antiplatelets: Secondary | ICD-10-CM | POA: Insufficient documentation

## 2024-05-05 DIAGNOSIS — C321 Malignant neoplasm of supraglottis: Secondary | ICD-10-CM | POA: Insufficient documentation

## 2024-05-05 DIAGNOSIS — Z7951 Long term (current) use of inhaled steroids: Secondary | ICD-10-CM | POA: Diagnosis not present

## 2024-05-05 DIAGNOSIS — Z923 Personal history of irradiation: Secondary | ICD-10-CM | POA: Insufficient documentation

## 2024-05-05 DIAGNOSIS — C320 Malignant neoplasm of glottis: Secondary | ICD-10-CM

## 2024-05-05 DIAGNOSIS — Z79899 Other long term (current) drug therapy: Secondary | ICD-10-CM | POA: Insufficient documentation

## 2024-05-05 LAB — TSH: TSH: 0.754 u[IU]/mL (ref 0.350–4.500)

## 2024-05-05 NOTE — Progress Notes (Signed)
 Radiation Oncology         [phone]) [phone] ________________________________  Name: Marie Johnson MRN: [id number]  Date: 05/05/2024  DOB: 06-Jun-1951  Follow-Up Visit Note  CC: Hamrick, Charlene CROME, MD  Hamrick, Charlene CROME, MD  Diagnosis and Prior Radiotherapy:       ICD-10-CM   1. 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) Staging form: Larynx - Supraglottis, AJCC 8th Edition - Clinical stage from 05/03/2023: Stage I (cT1, cN0, cM0) - Signed by Izell Domino, MD on 05/03/2023  Glottis carcinoma [facility]) Staging form: Larynx - Glottis, AJCC 8th Edition - Clinical stage from 05/03/2023: Stage I (cT1, cN0, cM0) - Signed by Izell Domino, MD on 05/03/2023 Stage prefix: Initial diagnosis  ==========DELIVERED PLANS==========  First Treatment Date: 2023-05-22 - Last Treatment Date: 2023-07-10   Plan Name: HN_Larynx Site: Larynx Technique: IMRT Mode: Photon Dose Per Fraction: 2 Gy Prescribed Dose (Delivered / Prescribed): 70 Gy / 70 Gy Prescribed Fxs (Delivered / Prescribed): 35 / 35  CHIEF COMPLAINT:  Here for follow-up and surveillance of laryngeal cancer; s/p radiotherapy  Narrative:   Marie Johnson is here for a follow up after completing radiation on 07/10/2023   Pain issues, if any: Denies throat pain, experiencing some throat hoarseness  Using a feeding tube?: Denies Weight changes, if any:  stable Swallowing issues, if any: Sometimes has issues swallowing certain foods Smoking or chewing tobacco? None Using fluoride toothpaste daily? Yes Last ENT visit was on: 02/04/2024 Other notable issues, if/ any:  None  BP 132/66 (BP Location: Left Arm, Patient Position: Sitting, Cuff Size: Normal)   Pulse (!) 109   Temp 97.8 F (36.6 C)   Resp (!) 24   Ht 5' 7.5 (1.715 m)   Wt 137 lb 9.6 oz  (62.4 kg)   SpO2 96%   BMI 21.23 kg/m      Wt Readings from Last 3 Encounters:  05/05/24 137 lb 9.6 oz (62.4 kg)  12/12/23 135 lb 3.2 oz (61.3 kg)  10/15/23 135 lb 12.8 oz (61.6 kg)      ALLERGIES:  is allergic to bee venom, insect extract, farxiga  [dapagliflozin ], lyrica [pregabalin], neurontin [gabapentin], nsaids, and latex.  Meds: Current Outpatient Medications  Medication Sig Dispense Refill   albuterol  (PROVENTIL  HFA;VENTOLIN  HFA) 108 (90 BASE) MCG/ACT inhaler Inhale 2 puffs into the lungs every 6 (six) hours as needed for wheezing or shortness of breath.     albuterol  (PROVENTIL ) (2.5 MG/3ML) 0.083% nebulizer solution Take 2.5 mg by nebulization every 6 (six) hours as needed for wheezing or shortness of breath.     alendronate  (FOSAMAX ) 70 MG tablet Take 70 mg by mouth every Wednesday. (Patient not taking: Reported on 10/15/2023)     allopurinol  (ZYLOPRIM ) 300 MG tablet Take 300 mg by mouth every evening.     aspirin  EC 81 MG tablet Take 81 mg by mouth in the morning. (Patient not taking: Reported on 12/12/2023)     atorvastatin  (LIPITOR ) 80 MG tablet Take 80 mg by mouth at bedtime.     bisacodyl  (DULCOLAX) 10 MG suppository Place 1 suppository (10 mg total) rectally daily as needed for moderate constipation. (Patient not taking: Reported on 12/12/2023) 12 suppository 0   calcium  carbonate (ANTACID) 500 MG chewable tablet Chew  1 tablet by mouth 2 (two) times daily as needed for heartburn or indigestion.     Cholecalciferol (VITAMIN D ) 125 MCG (5000 UT) CAPS Take 5,000 Units by mouth in the morning.     clopidogrel  (PLAVIX ) 75 MG tablet TAKE 1 TABLET BY MOUTH AT BEDTIME. 90 tablet 0   cyanocobalamin  (VITAMIN B12) 1000 MCG tablet Take 1,000 mcg by mouth daily. (Patient not taking: Reported on 10/15/2023)     Dupilumab  (DUPIXENT ) 300 MG/2ML SOAJ Inject 300 mg into the skin every 14 (fourteen) days. 12 mL 1   EPIPEN  2-PAK 0.3 MG/0.3ML SOAJ injection Inject 0.3 mg into the muscle daily  as needed for anaphylaxis. Use as directed as needed for allergies.     ezetimibe  (ZETIA ) 10 MG tablet Take 1 tablet (10 mg total) by mouth daily. 90 tablet 1   feeding supplement (ENSURE ENLIVE / ENSURE PLUS) LIQD Take 237 mLs by mouth 2 (two) times daily between meals. (Patient not taking: Reported on 12/12/2023) 237 mL 12   fluticasone -salmeterol (WIXELA INHUB) 500-50 MCG/ACT AEPB Inhale 1 puff into the lungs in the morning and at bedtime. 3 each 3   furosemide  (LASIX ) 40 MG tablet Take one tablet by mouth twice daily on Mondays, Wednesdays, and Fridays, then take one tablet by mouth daily on other days. 120 tablet 2   lactulose  (CHRONULAC ) 10 GM/15ML solution Take 20 g by mouth 2 (two) times daily as needed for moderate constipation. (Patient not taking: Reported on 12/12/2023)     leptospermum manuka honey (MEDIHONEY) PSTE paste Apply 1 Application topically daily. (Patient not taking: Reported on 12/12/2023) 44 mL 0   lidocaine  (XYLOCAINE ) 2 % solution PLEASE SEE ATTACHED FOR DETAILED DIRECTIONS (Patient not taking: Reported on 12/12/2023) 200 mL 3   melatonin 5 MG TABS Take 5 mg by mouth at bedtime. (Patient not taking: Reported on 10/15/2023)     nitroGLYCERIN  (NITROSTAT ) 0.4 MG SL tablet Place 1 tablet (0.4 mg total) under the tongue every 5 (five) minutes x 3 doses as needed for chest pain. 25 tablet 4   Oxycodone  HCl 10 MG TABS Take 1 tablet (10 mg total) by mouth 4 (four) times daily as needed (for pain). 20 tablet 0   pantoprazole  (PROTONIX ) 40 MG tablet Take 40 mg by mouth 2 (two) times daily.     phenol (CHLORASEPTIC) 1.4 % LIQD Use as directed 1 spray in the mouth or throat as needed for throat irritation / pain. (Patient not taking: Reported on 12/12/2023) 118 mL 0   polyethylene glycol powder (GLYCOLAX /MIRALAX ) 17 GM/SCOOP powder Mix 17 g of powder in 4 oz of water or juice & drink by mouth daily. (Patient not taking: Reported on 12/12/2023) 238 g 0   potassium chloride  SA (KLOR-CON  M20) 20  MEQ tablet TAKE 1 TABLET BY MOUTH TWICE DAILY ON MONDAYS, WEDNESDAYS, AND FRIDAYS, THEN TAKE ONE TABLET BY MOUTH DAILY ON OTHER DAYS. 120 tablet 2   senna-docusate (SENOKOT-S) 8.6-50 MG tablet Take 2 tablets by mouth at bedtime.     sodium chloride  (OCEAN) 0.65 % SOLN nasal spray Place 1 spray into both nostrils as needed for congestion. (Patient not taking: Reported on 12/12/2023) 44 mL 0   SPIRIVA  RESPIMAT 2.5 MCG/ACT AERS Take 2 puffs by mouth daily.     spironolactone  (ALDACTONE ) 25 MG tablet Take 1 tablet (25 mg total) by mouth daily. 90 tablet 3   verapamil  (CALAN -SR) 240 MG CR tablet Take 1 tablet (240 mg total) by mouth daily. 90 tablet  1   No current facility-administered medications for this encounter.   Facility-Administered Medications Ordered in Other Encounters  Medication Dose Route Frequency Provider Last Rate Last Admin   DOBUTamine  (DOBUTREX ) 1,000 mcg/mL in dextrose  5% 250 mL infusion  30 mcg/kg/min Intravenous Titrated Maranda Leim DEL, MD        Physical Findings: The patient is in no acute distress. Patient is alert and oriented. Wt Readings from Last 3 Encounters:  05/05/24 137 lb 9.6 oz (62.4 kg)  12/12/23 135 lb 3.2 oz (61.3 kg)  10/15/23 135 lb 12.8 oz (61.6 kg)    height is 5' 7.5 (1.715 m) and weight is 137 lb 9.6 oz (62.4 kg). Her temperature is 97.8 F (36.6 C). Her blood pressure is 132/66 and her pulse is 109 (abnormal). Her respiration is 24 (abnormal) and oxygen  saturation is 96%. .  General: Alert and oriented, in no acute distress HEENT: Head is normocephalic. Extraocular movements are intact. Oropharynx is notable for no concerning lesions, dentures in place but then removed.  She is breathing oxygen  through nasal cannula Neck: Neck is notable for no palpable masses Skin: Skin in treatment fields has healed well. Heart: Regular in rate and rhythm with no murmurs, rubs, or gallops. Chest: Clear to auscultation bilaterally, with no rhonchi, wheezes, or  rales. Abdomen: Protuberant with no tenderness to palpation.  Extremities: Lower extremity mild edema. Lymphatics: see Neck Exam Psychiatric: Judgment and insight are intact. Affect is appropriate.  LARYNGOSCOPY PROCEDURE NOTE: After obtaining consent and spraying nasal cavity with topical lidocaine  and oxymetazoline , the flexible endoscope was coated with lidocaine  gel and introduced and passed through the nasal cavity.  The nasopharynx, oropharynx, hypopharynx, and larynx were then examined.  There is a mild patch of mucositis over the superior aspect of the epiglottis but no nodularity or mass. The true cords were symmetrically mobile without lesions. The patient tolerated the procedure well.    Lab Findings: Lab Results  Component Value Date   WBC 4.9 06/11/2023   HGB 11.5 (L) 06/11/2023   HCT 35.3 (L) 06/11/2023   MCV 100.0 06/11/2023   PLT 226 06/11/2023    Lab Results  Component Value Date   TSH 0.624 05/10/2023    Radiographic Findings: No results found.   Impression/Plan:    1) Head and Neck Cancer Status: Patient continues to heal from the effects of radiation. We personally reviewed her most recent PET scan today which showed no evidence of disease progression or persisence. Laryngoscope today was consistent with these findings. Patient and her family were very pleased to hear these results today.   2) Nutritional Status: stable. Wt Readings from Last 3 Encounters:  05/05/24 137 lb 9.6 oz (62.4 kg)  12/12/23 135 lb 3.2 oz (61.3 kg)  10/15/23 135 lb 12.8 oz (61.6 kg)   No PEG Tube.  3) Risk Factors: The patient has been educated about risk factors including alcohol and tobacco abuse; they understand that avoidance of alcohol and tobacco is important to prevent recurrences as well as other cancers. She is not smoking or chewing tobacco.   4) Swallowing: Functional, continue swallowing exercises  5) Dental: Patient has dentures.   6) Thyroid  function: Checking  annually -lab result pending today Lab Results  Component Value Date   TSH 0.624 05/10/2023    8) Patient will follow-up with Dr. Carlie in 3 months (referral placed back to him) and radiation oncology in 7  months.  Our navigator will arrange restaging CT scans to take  place at her next follow-up with us  in 7 months.  On date of service, in total, we spent 50 minutes on this encounter. Patient was seen in person.   -----------------------------------  Lauraine Golden, MD

## 2024-05-06 ENCOUNTER — Other Ambulatory Visit: Payer: Self-pay

## 2024-05-06 DIAGNOSIS — C329 Malignant neoplasm of larynx, unspecified: Secondary | ICD-10-CM

## 2024-05-06 NOTE — Progress Notes (Signed)
 Oncology Nurse Navigator Documentation   Per patient's 05/05/24 post-treatment follow-up with Dr. Izell, sent fax to [facility] ENT Scheduling with request Ms. Sheaffer be contacted and scheduled for routine post-RT follow-up with Dr. Carlie in 3 months.  Notification of successful fax transmission received.    Delon Jefferson RN, BSN, OCN Head & Neck Oncology Nurse Navigator [facility] Phone # [phone]  Fax # [phone]

## 2024-05-24 ENCOUNTER — Other Ambulatory Visit: Payer: Self-pay | Admitting: Vascular Surgery

## 2024-06-29 ENCOUNTER — Inpatient Hospital Stay (HOSPITAL_COMMUNITY)

## 2024-06-29 ENCOUNTER — Inpatient Hospital Stay (HOSPITAL_COMMUNITY): Admission: EM | Admit: 2024-06-29 | Discharge: 2024-08-17 | DRG: 533 | Disposition: E

## 2024-06-29 ENCOUNTER — Other Ambulatory Visit: Payer: Self-pay

## 2024-06-29 ENCOUNTER — Emergency Department (HOSPITAL_COMMUNITY)

## 2024-06-29 ENCOUNTER — Encounter (HOSPITAL_COMMUNITY): Payer: Self-pay

## 2024-06-29 DIAGNOSIS — Z01810 Encounter for preprocedural cardiovascular examination: Secondary | ICD-10-CM | POA: Diagnosis not present

## 2024-06-29 DIAGNOSIS — I5032 Chronic diastolic (congestive) heart failure: Secondary | ICD-10-CM | POA: Diagnosis present

## 2024-06-29 DIAGNOSIS — Z801 Family history of malignant neoplasm of trachea, bronchus and lung: Secondary | ICD-10-CM

## 2024-06-29 DIAGNOSIS — E876 Hypokalemia: Secondary | ICD-10-CM | POA: Diagnosis present

## 2024-06-29 DIAGNOSIS — I10 Essential (primary) hypertension: Secondary | ICD-10-CM | POA: Diagnosis present

## 2024-06-29 DIAGNOSIS — I739 Peripheral vascular disease, unspecified: Secondary | ICD-10-CM | POA: Diagnosis present

## 2024-06-29 DIAGNOSIS — W19XXXA Unspecified fall, initial encounter: Principal | ICD-10-CM | POA: Diagnosis present

## 2024-06-29 DIAGNOSIS — F419 Anxiety disorder, unspecified: Secondary | ICD-10-CM | POA: Diagnosis present

## 2024-06-29 DIAGNOSIS — M810 Age-related osteoporosis without current pathological fracture: Secondary | ICD-10-CM | POA: Diagnosis present

## 2024-06-29 DIAGNOSIS — I110 Hypertensive heart disease with heart failure: Secondary | ICD-10-CM | POA: Diagnosis present

## 2024-06-29 DIAGNOSIS — Z90710 Acquired absence of both cervix and uterus: Secondary | ICD-10-CM

## 2024-06-29 DIAGNOSIS — E538 Deficiency of other specified B group vitamins: Secondary | ICD-10-CM | POA: Diagnosis present

## 2024-06-29 DIAGNOSIS — Z7902 Long term (current) use of antithrombotics/antiplatelets: Secondary | ICD-10-CM | POA: Diagnosis not present

## 2024-06-29 DIAGNOSIS — Z7983 Long term (current) use of bisphosphonates: Secondary | ICD-10-CM

## 2024-06-29 DIAGNOSIS — Z91030 Bee allergy status: Secondary | ICD-10-CM

## 2024-06-29 DIAGNOSIS — K567 Ileus, unspecified: Secondary | ICD-10-CM | POA: Diagnosis present

## 2024-06-29 DIAGNOSIS — E875 Hyperkalemia: Secondary | ICD-10-CM | POA: Diagnosis present

## 2024-06-29 DIAGNOSIS — J439 Emphysema, unspecified: Secondary | ICD-10-CM | POA: Diagnosis present

## 2024-06-29 DIAGNOSIS — R0603 Acute respiratory distress: Secondary | ICD-10-CM | POA: Diagnosis not present

## 2024-06-29 DIAGNOSIS — K9189 Other postprocedural complications and disorders of digestive system: Secondary | ICD-10-CM | POA: Diagnosis not present

## 2024-06-29 DIAGNOSIS — Z515 Encounter for palliative care: Secondary | ICD-10-CM | POA: Diagnosis not present

## 2024-06-29 DIAGNOSIS — D72829 Elevated white blood cell count, unspecified: Secondary | ICD-10-CM | POA: Diagnosis not present

## 2024-06-29 DIAGNOSIS — J690 Pneumonitis due to inhalation of food and vomit: Secondary | ICD-10-CM | POA: Diagnosis present

## 2024-06-29 DIAGNOSIS — E785 Hyperlipidemia, unspecified: Secondary | ICD-10-CM | POA: Diagnosis present

## 2024-06-29 DIAGNOSIS — J9621 Acute and chronic respiratory failure with hypoxia: Secondary | ICD-10-CM | POA: Diagnosis present

## 2024-06-29 DIAGNOSIS — Z6820 Body mass index (BMI) 20.0-20.9, adult: Secondary | ICD-10-CM

## 2024-06-29 DIAGNOSIS — S72001A Fracture of unspecified part of neck of right femur, initial encounter for closed fracture: Secondary | ICD-10-CM | POA: Diagnosis present

## 2024-06-29 DIAGNOSIS — I2510 Atherosclerotic heart disease of native coronary artery without angina pectoris: Secondary | ICD-10-CM | POA: Diagnosis present

## 2024-06-29 DIAGNOSIS — E782 Mixed hyperlipidemia: Secondary | ICD-10-CM

## 2024-06-29 DIAGNOSIS — S72301A Unspecified fracture of shaft of right femur, initial encounter for closed fracture: Secondary | ICD-10-CM | POA: Diagnosis present

## 2024-06-29 DIAGNOSIS — J9811 Atelectasis: Secondary | ICD-10-CM | POA: Diagnosis present

## 2024-06-29 DIAGNOSIS — J4489 Other specified chronic obstructive pulmonary disease: Secondary | ICD-10-CM | POA: Diagnosis present

## 2024-06-29 DIAGNOSIS — D649 Anemia, unspecified: Secondary | ICD-10-CM | POA: Diagnosis not present

## 2024-06-29 DIAGNOSIS — Z8711 Personal history of peptic ulcer disease: Secondary | ICD-10-CM

## 2024-06-29 DIAGNOSIS — Z8521 Personal history of malignant neoplasm of larynx: Secondary | ICD-10-CM

## 2024-06-29 DIAGNOSIS — Z8249 Family history of ischemic heart disease and other diseases of the circulatory system: Secondary | ICD-10-CM

## 2024-06-29 DIAGNOSIS — M9701XA Periprosthetic fracture around internal prosthetic right hip joint, initial encounter: Secondary | ICD-10-CM | POA: Diagnosis present

## 2024-06-29 DIAGNOSIS — Z23 Encounter for immunization: Secondary | ICD-10-CM | POA: Diagnosis not present

## 2024-06-29 DIAGNOSIS — K219 Gastro-esophageal reflux disease without esophagitis: Secondary | ICD-10-CM | POA: Diagnosis present

## 2024-06-29 DIAGNOSIS — Z91038 Other insect allergy status: Secondary | ICD-10-CM

## 2024-06-29 DIAGNOSIS — E870 Hyperosmolality and hypernatremia: Secondary | ICD-10-CM | POA: Diagnosis present

## 2024-06-29 DIAGNOSIS — I517 Cardiomegaly: Secondary | ICD-10-CM | POA: Diagnosis not present

## 2024-06-29 DIAGNOSIS — Z9981 Dependence on supplemental oxygen: Secondary | ICD-10-CM

## 2024-06-29 DIAGNOSIS — Z91040 Latex allergy status: Secondary | ICD-10-CM

## 2024-06-29 DIAGNOSIS — J449 Chronic obstructive pulmonary disease, unspecified: Secondary | ICD-10-CM | POA: Diagnosis present

## 2024-06-29 DIAGNOSIS — M7989 Other specified soft tissue disorders: Secondary | ICD-10-CM | POA: Diagnosis not present

## 2024-06-29 DIAGNOSIS — R262 Difficulty in walking, not elsewhere classified: Secondary | ICD-10-CM | POA: Diagnosis present

## 2024-06-29 DIAGNOSIS — R54 Age-related physical debility: Secondary | ICD-10-CM | POA: Diagnosis present

## 2024-06-29 DIAGNOSIS — S72001G Fracture of unspecified part of neck of right femur, subsequent encounter for closed fracture with delayed healing: Secondary | ICD-10-CM | POA: Diagnosis not present

## 2024-06-29 DIAGNOSIS — Z79891 Long term (current) use of opiate analgesic: Secondary | ICD-10-CM

## 2024-06-29 DIAGNOSIS — Z886 Allergy status to analgesic agent status: Secondary | ICD-10-CM

## 2024-06-29 DIAGNOSIS — D638 Anemia in other chronic diseases classified elsewhere: Secondary | ICD-10-CM | POA: Diagnosis present

## 2024-06-29 DIAGNOSIS — R000 Tachycardia, unspecified: Secondary | ICD-10-CM | POA: Diagnosis present

## 2024-06-29 DIAGNOSIS — I959 Hypotension, unspecified: Secondary | ICD-10-CM | POA: Diagnosis not present

## 2024-06-29 DIAGNOSIS — J42 Unspecified chronic bronchitis: Secondary | ICD-10-CM

## 2024-06-29 DIAGNOSIS — E440 Moderate protein-calorie malnutrition: Secondary | ICD-10-CM | POA: Diagnosis present

## 2024-06-29 DIAGNOSIS — Z803 Family history of malignant neoplasm of breast: Secondary | ICD-10-CM

## 2024-06-29 DIAGNOSIS — I4891 Unspecified atrial fibrillation: Secondary | ICD-10-CM | POA: Diagnosis present

## 2024-06-29 DIAGNOSIS — Z800 Family history of malignant neoplasm of digestive organs: Secondary | ICD-10-CM

## 2024-06-29 DIAGNOSIS — Z720 Tobacco use: Secondary | ICD-10-CM | POA: Diagnosis not present

## 2024-06-29 DIAGNOSIS — Z79899 Other long term (current) drug therapy: Secondary | ICD-10-CM

## 2024-06-29 DIAGNOSIS — Z8049 Family history of malignant neoplasm of other genital organs: Secondary | ICD-10-CM

## 2024-06-29 DIAGNOSIS — Z8679 Personal history of other diseases of the circulatory system: Secondary | ICD-10-CM

## 2024-06-29 DIAGNOSIS — Z888 Allergy status to other drugs, medicaments and biological substances status: Secondary | ICD-10-CM

## 2024-06-29 DIAGNOSIS — Z8042 Family history of malignant neoplasm of prostate: Secondary | ICD-10-CM

## 2024-06-29 DIAGNOSIS — M109 Gout, unspecified: Secondary | ICD-10-CM | POA: Diagnosis present

## 2024-06-29 DIAGNOSIS — S7291XA Unspecified fracture of right femur, initial encounter for closed fracture: Secondary | ICD-10-CM | POA: Diagnosis not present

## 2024-06-29 DIAGNOSIS — Z87892 Personal history of anaphylaxis: Secondary | ICD-10-CM

## 2024-06-29 DIAGNOSIS — F17210 Nicotine dependence, cigarettes, uncomplicated: Secondary | ICD-10-CM | POA: Diagnosis present

## 2024-06-29 DIAGNOSIS — Y92009 Unspecified place in unspecified non-institutional (private) residence as the place of occurrence of the external cause: Secondary | ICD-10-CM | POA: Diagnosis not present

## 2024-06-29 DIAGNOSIS — Z96642 Presence of left artificial hip joint: Secondary | ICD-10-CM | POA: Diagnosis present

## 2024-06-29 LAB — FERRITIN: Ferritin: 475 ng/mL — ABNORMAL HIGH (ref 11–307)

## 2024-06-29 LAB — CBC WITH DIFFERENTIAL/PLATELET
Basophils Absolute: 0 K/uL (ref 0.0–0.1)
Basophils Relative: 0 %
Eosinophils Absolute: 0 K/uL (ref 0.0–0.5)
Eosinophils Relative: 0 %
HCT: 29.3 % — ABNORMAL LOW (ref 36.0–46.0)
Hemoglobin: 9.3 g/dL — ABNORMAL LOW (ref 12.0–15.0)
Lymphocytes Relative: 4 %
Lymphs Abs: 0.4 K/uL — ABNORMAL LOW (ref 0.7–4.0)
MCH: 32.4 pg (ref 26.0–34.0)
MCHC: 31.7 g/dL (ref 30.0–36.0)
MCV: 102.1 fL — ABNORMAL HIGH (ref 80.0–100.0)
Monocytes Absolute: 0.3 K/uL (ref 0.1–1.0)
Monocytes Relative: 3 %
Neutro Abs: 9.9 K/uL — ABNORMAL HIGH (ref 1.7–7.7)
Neutrophils Relative %: 93 %
Platelets: 304 K/uL (ref 150–400)
RBC: 2.87 MIL/uL — ABNORMAL LOW (ref 3.87–5.11)
RDW: 23.9 % — ABNORMAL HIGH (ref 11.5–15.5)
WBC: 10.6 K/uL — ABNORMAL HIGH (ref 4.0–10.5)
nRBC: 0.8 % — ABNORMAL HIGH (ref 0.0–0.2)

## 2024-06-29 LAB — BASIC METABOLIC PANEL WITH GFR
Anion gap: 10 (ref 5–15)
BUN: 15 mg/dL (ref 8–23)
CO2: 29 mmol/L (ref 22–32)
Calcium: 9.3 mg/dL (ref 8.9–10.3)
Chloride: 99 mmol/L (ref 98–111)
Creatinine, Ser: 0.85 mg/dL (ref 0.44–1.00)
GFR, Estimated: >60 mL/min (ref >60)
Glucose, Bld: 103 mg/dL — ABNORMAL HIGH (ref 70–99)
Potassium: 3.6 mmol/L (ref 3.5–5.1)
Sodium: 138 mmol/L (ref 135–145)

## 2024-06-29 LAB — IRON AND TIBC
Iron: 61 ug/dL (ref 28–170)
Saturation Ratios: 19 % (ref 10.4–31.8)
TIBC: 328 ug/dL (ref 250–450)
UIBC: 267 ug/dL

## 2024-06-29 LAB — RETICULOCYTES
Immature Retic Fract: 12.9 % (ref 2.3–15.9)
RBC.: 3.1 MIL/uL — ABNORMAL LOW (ref 3.87–5.11)
Retic Count, Absolute: 38.4 K/uL (ref 19.0–186.0)
Retic Ct Pct: 1.2 % (ref 0.4–3.1)

## 2024-06-29 LAB — PHOSPHORUS: Phosphorus: 2.7 mg/dL (ref 2.5–4.6)

## 2024-06-29 LAB — VITAMIN B12: Vitamin B-12: 371 pg/mL (ref 180–914)

## 2024-06-29 LAB — TYPE AND SCREEN
ABO/RH(D): A POS
Antibody Screen: NEGATIVE

## 2024-06-29 LAB — FOLATE: Folate: 16.9 ng/mL (ref >5.9)

## 2024-06-29 LAB — CK: Total CK: 114 U/L (ref 38–234)

## 2024-06-29 LAB — MAGNESIUM: Magnesium: 1.6 mg/dL — ABNORMAL LOW (ref 1.7–2.4)

## 2024-06-29 MED ORDER — EZETIMIBE 10 MG PO TABS
10.0000 mg | ORAL_TABLET | Freq: Every day | ORAL | Status: DC
  Administered 2024-06-30 – 2024-07-03 (×4): 10 mg via ORAL
  Filled 2024-06-29 (×4): qty 1

## 2024-06-29 MED ORDER — MAGNESIUM SULFATE IN D5W 1-5 GM/100ML-% IV SOLN
1.0000 g | Freq: Once | INTRAVENOUS | Status: AC
  Administered 2024-06-30: 1 g via INTRAVENOUS
  Filled 2024-06-29: qty 100

## 2024-06-29 MED ORDER — HYDROCODONE-ACETAMINOPHEN 5-325 MG PO TABS: 1.0000 | ORAL_TABLET | Freq: Once | ORAL | Status: DC

## 2024-06-29 MED ORDER — ATORVASTATIN CALCIUM 40 MG PO TABS
80.0000 mg | ORAL_TABLET | Freq: Every day | ORAL | Status: DC
  Administered 2024-06-30 – 2024-07-03 (×4): 80 mg via ORAL
  Filled 2024-06-29 (×4): qty 2

## 2024-06-29 MED ORDER — MORPHINE SULFATE (PF) 4 MG/ML IV SOLN
4.0000 mg | Freq: Once | INTRAVENOUS | Status: AC
  Administered 2024-06-29: 4 mg via INTRAVENOUS
  Filled 2024-06-29: qty 1

## 2024-06-29 MED ORDER — METHOCARBAMOL 500 MG PO TABS
500.0000 mg | ORAL_TABLET | Freq: Four times a day (QID) | ORAL | Status: DC | PRN
  Administered 2024-06-30 – 2024-07-11 (×14): 500 mg via ORAL
  Filled 2024-06-29 (×14): qty 1

## 2024-06-29 MED ORDER — TETANUS-DIPHTH-ACELL PERTUSSIS 5-2-15.5 LF-MCG/0.5 IM SUSP
0.5000 mL | Freq: Once | INTRAMUSCULAR | Status: AC
  Administered 2024-06-29: 0.5 mL via INTRAMUSCULAR
  Filled 2024-06-29: qty 0.5

## 2024-06-29 MED ORDER — VERAPAMIL HCL ER 240 MG PO TBCR
240.0000 mg | EXTENDED_RELEASE_TABLET | Freq: Every day | ORAL | Status: DC
  Administered 2024-06-30 – 2024-07-05 (×6): 240 mg via ORAL
  Filled 2024-06-29 (×8): qty 1

## 2024-06-29 MED ORDER — PANTOPRAZOLE SODIUM 40 MG PO TBEC
40.0000 mg | DELAYED_RELEASE_TABLET | Freq: Two times a day (BID) | ORAL | Status: DC
  Administered 2024-06-29 – 2024-07-05 (×12): 40 mg via ORAL
  Filled 2024-06-29 (×13): qty 1

## 2024-06-29 MED ORDER — OXYCODONE HCL 5 MG PO TABS
10.0000 mg | ORAL_TABLET | Freq: Four times a day (QID) | ORAL | Status: DC | PRN
  Administered 2024-06-29 – 2024-07-02 (×8): 10 mg via ORAL
  Filled 2024-06-29 (×8): qty 2

## 2024-06-29 MED ORDER — HYDROMORPHONE HCL 1 MG/ML IJ SOLN
0.5000 mg | INTRAMUSCULAR | Status: DC | PRN
  Administered 2024-06-29 – 2024-07-06 (×16): 0.5 mg via INTRAVENOUS
  Filled 2024-06-29 (×16): qty 0.5

## 2024-06-29 MED ORDER — MELATONIN 3 MG PO TABS
3.0000 mg | ORAL_TABLET | Freq: Every evening | ORAL | Status: DC | PRN
  Administered 2024-06-30 – 2024-07-11 (×7): 3 mg via ORAL
  Filled 2024-06-29 (×7): qty 1

## 2024-06-29 MED ORDER — METHOCARBAMOL 1000 MG/10ML IJ SOLN
500.0000 mg | Freq: Four times a day (QID) | INTRAMUSCULAR | Status: DC | PRN
Start: 2024-06-29 — End: 2024-07-12
  Administered 2024-07-06 – 2024-07-12 (×5): 500 mg via INTRAVENOUS
  Filled 2024-06-29 (×5): qty 10

## 2024-06-29 NOTE — H&P (Signed)
 [name]:[id number] DOB: [birth year] DOA: 06/29/2024     PCP: Stephanie Charlene CROME, MD   Outpatient Specialists:   CARDS: Dr. Powell FORBES Sorrow, MD (Inactive)    Patient arrived to ER on 06/29/24 at 1633 Referred by Attending Silvester Ales, MD   Patient coming from:    home Lives  With family   Chief Complaint:   Chief Complaint  Patient presents with   Fall    HPI: Marie Johnson is a 73 y.o. female with medical history significant of COPD on chronic 2 L  CAD, PAD, HTN , HLD,   Presented with: mechanical Fall resulting right hip pain Pt is on chronic O2 for hx of COPD was walking to porch to bring husband coffee when tripped on her o2 tubing and fell , landed on right side  No head trauma, no LOC Patient reports shortness of breath at baseline and with activity She walks with a walker past history of bilateral hip replacements Imaging showed Periprostatic fracture Orthopedics consulted will see patient in the morning fracture appears stable may not need operative intervention but will need further evaluation with hip repair specialist Patient denies alcohol abuse or tobacco abuse currently     Denies significant ETOH intake   Does not smoke      Regarding pertinent Chronic problems:     Hyperlipidemia - on statins Lipitor  (atorvastatin ) Zetia  Lipid Panel     Component Value Date/Time   CHOL 125 02/02/2021 0947   TRIG 66 02/02/2021 0947   HDL 49 02/02/2021 0947   CHOLHDL 2.6 02/02/2021 0947   CHOLHDL 3.2 03/19/2020 1234   VLDL 22 03/19/2020 1234   LDLCALC 62 02/02/2021 0947   LABVLDL 14 02/02/2021 0947     HTN on Lasix  verapamil    - last echo  Recent Results (from the past 56199 hours)  ECHOCARDIOGRAM COMPLETE   Collection Time: 04/07/23 12:29 PM  Result Value   BP 138/73   S' Lateral 2.80   AR max vel 2.44   AV Area VTI 3.00   AV Mean grad 3.0   AV Peak grad 5.5   Ao pk vel 1.17   Area-P 1/2 3.61   AV Area mean vel 2.48   MV VTI  2.60   Est EF 55 - 60%   Narrative      ECHOCARDIOGRAM REPORT        IMPRESSIONS    1. Small region of apical hypokinesis/akinesis. Basal inferior hypokinesis. . Left ventricular ejection fraction, by estimation, is 55 to 60%. The left ventricle has normal function.  2. Right ventricular systolic function is normal. The right ventricular size is normal.  3. Left atrial size was mildly dilated.  4. The mitral valve is normal in structure. Mild mitral valve regurgitation.  5. The aortic valve is tricuspid. Aortic valve regurgitation is not visualized. Aortic valve sclerosis is present, with no evidence of aortic valve stenosis.           CAD  - On Aspirin , statin,                 - followed by cardiology                - last cardiac cath 2024     PAD -  No evidence of high-grade proximal stenosis in the left leg bypass since it was relined.  Discussed she stay on aspirin  Plavix  statin. 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  Left forearm - Nondisplaced periprosthetic right proximal femoral shaft fracture.  Elbow - no frx Right knee - No acute fracture or dislocation.   Lumbar spine - No evidence of acute fracture or traumatic malalignment.     CXR -  ordered    Following Medications were ordered in ER: Medications   Tdap (ADACEL) injection 0.5 mL (0.5 mLs Intramuscular Given 06/29/24 1925)  morphine  (PF) 4 MG/ML injection 4 mg (4 mg Intravenous Given 06/29/24 1920)    _______________________________________________________ ER Provider Called:     orthopedics  Dr.Avery They Recommend admit to medicine  Will see in AM       ED Triage Vitals  Encounter Vitals Group     BP 06/29/24 1648 134/61     Girls Systolic BP Percentile --      Girls Diastolic BP Percentile --      Boys Systolic BP Percentile --      Boys Diastolic BP Percentile --      Pulse Rate 06/29/24 1648 97     Resp 06/29/24 1648 16     Temp 06/29/24 1648 98 F (36.7 C)     Temp Source 06/29/24 1648 Oral     SpO2 06/29/24 1640 95 %     Weight --      Height --      Head Circumference --      Peak Flow --      Pain Score 06/29/24 1646 10     Pain Loc --      Pain Education --      Exclude from Growth Chart --   UFJK(75)@     _________________________________________ Significant initial  Findings: Abnormal Labs Reviewed  CBC WITH DIFFERENTIAL/PLATELET - Abnormal; Notable for the following components:      Result Value   WBC 10.6 (*)    RBC 2.87 (*)    Hemoglobin 9.3 (*)    HCT 29.3 (*)    MCV 102.1 (*)    RDW 23.9 (*)    nRBC 0.8 (*)    All other components within normal limits  BASIC METABOLIC PANEL WITH GFR - Abnormal; Notable for the following components:   Glucose, Bld 103 (*)    All other components within normal limits     ECG: Ordered   The recent clinical data is shown below. Vitals:   06/29/24 1640 06/29/24 1648 06/29/24 2035  BP:  134/61 (!) 142/76  Pulse:  97 93  Resp:  16 20  Temp:  98 F (36.7 C)   TempSrc:  Oral   SpO2: 95% 93% 93%    WBC     Component Value Date/Time  WBC 10.6 (H) 06/29/2024 1910   LYMPHSABS PENDING 06/29/2024 1910   LYMPHSABS 2.1 07/01/2020 1141   MONOABS PENDING 06/29/2024 1910   EOSABS PENDING 06/29/2024 1910   EOSABS 0.1 07/01/2020 1141   BASOSABS PENDING  06/29/2024 1910   BASOSABS 0.0 07/01/2020 1141     __________________________________________________________ Recent Labs  Lab 06/29/24 1910  NA 138  K 3.6  CO2 29  GLUCOSE 103*  BUN 15  CREATININE 0.85  CALCIUM  9.3    Cr   stable,   Lab Results  Component Value Date   CREATININE 0.85 06/29/2024   CREATININE 0.75 06/11/2023   CREATININE 0.59 06/10/2023    No results for input(s): AST, ALT, ALKPHOS, BILITOT, PROT, ALBUMIN  in the last 168 hours. Lab Results  Component Value Date   CALCIUM  9.3 06/29/2024   PHOS 3.6 06/11/2023       Plt: Lab Results  Component Value Date   PLT 304 06/29/2024    Recent Labs  Lab 06/29/24 1910  WBC 10.6*  NEUTROABS PENDING  HGB 9.3*  HCT 29.3*  MCV 102.1*  PLT 304    HG/HCT   Down   from baseline see below    Component Value Date/Time   HGB 9.3 (L) 06/29/2024 1910   HGB 10.5 (L) 07/01/2020 1141   HCT 29.3 (L) 06/29/2024 1910   HCT 34.4 07/01/2020 1141   MCV 102.1 (H) 06/29/2024 1910   MCV 77 (L) 07/01/2020 1141   ______________ Hospitalist was called for admission for  right hip fracture  The following Work up has been ordered so far:  Orders Placed This Encounter  Procedures   DG Hip Unilat W or Wo Pelvis 2-3 Views Right   DG Forearm Left   DG Elbow Complete Right   DG Knee 1-2 Views Right   DG Lumbar Spine 2-3 Views   CT Hip Right Wo Contrast   CBC with Differential   Basic metabolic panel   Cardiac Monitoring Continuous x 24 hours Indications for use: Other; Other indications for use: hx of cad   Consult to orthopedic surgery   Consult for Unassigned Medical Admission   Admit to Inpatient (patient's expected length of stay will be greater than 2 midnights or inpatient only procedure)         Cultures: No results found for: SDES, SPECREQUEST, CULT, REPTSTATUS   Radiological Exams on Admission: DG Elbow Complete Right Result Date: 06/29/2024 EXAM: 3 VIEW(S) XRAY OF THE _LATERALITY_  ELBOW COMPARISON: None available. CLINICAL HISTORY: injury. Reason for exam: injury; Triage notes: Patient coming from home after a mechanical fall this afternoon. EMS reprots that she has bilateral bruising on knees, bilateral skin tears on arms, bilateral hip replacements and right hip and leg pain and the right leg is shorter. ; Patient denies LOC, did not hit head, denies back and neck pain. Recently stopped taking blood thinners. Chronically on 2L of oxygen . FINDINGS: BONES AND JOINTS: No acute fracture. No focal osseous lesion. No joint dislocation. No joint effusion. SOFT TISSUES: The soft tissues are unremarkable. IMPRESSION: 1. No acute abnormality. Electronically signed by: Norman Gatlin MD 06/29/2024 07:16 PM EDT RP Workstation: [id number]   DG Forearm Left Result Date: 06/29/2024 EXAM: VIEW(S) XRAY OF THE LEFT FOREARM 06/29/2024 07:00:00 PM COMPARISON: None available. CLINICAL HISTORY: Injury. Reason for exam: injury; Triage notes: Patient coming from home after a mechanical fall this afternoon. EMS reports that she has bilateral bruising on knees, bilateral skin tears on arms, bilateral hip replacements and right hip  and leg pain and the right leg is shorter. FINDINGS: BONES AND JOINTS: Plate and screw fixation of distal radius in place. Chronic ulnar styloid process fracture. Chronic deformity of distal ulna. Advanced first CMC and triscaphe degenerative change. No joint dislocation. SOFT TISSUES: Soft tissue swelling of distal forearm. IMPRESSION: 1. No acute fracture or dislocation. Electronically signed by: Norman Gatlin MD 06/29/2024 07:16 PM EDT RP Workstation: [id number]   DG Lumbar Spine 2-3 Views Result Date: 06/29/2024 EXAM: 2 or 3 VIEW(S) XRAY OF THE LUMBAR SPINE 06/29/2024 07:00:00 PM COMPARISON: None available. CLINICAL HISTORY: [id number] Injury [id number]. Reason for exam: injury; Triage notes: Patient coming from home after a mechanical fall this afternoon. EMS reprots that she has  bilateral bruising on knees, bilateral skin tears on arms, bilateral hip replacements and right hip and leg pain and the right leg is shorter. Patient denies LOC, did not hit head, denies back and neck pain. Recently stopped taking blood thinners. Chronically on 2L of oxygen . FINDINGS: LUMBAR SPINE: BONES: Demineralization and degenerative changes limiting assessment for subtle fractures. No definite acute fracture or evidence of traumatic malalignment. No aggressive appearing osseous lesion. DISCS AND DEGENERATIVE CHANGES: Degenerative changes are present. No severe degenerative changes. SOFT TISSUES: No acute abnormality. IMPRESSION: 1. No evidence of acute fracture or traumatic malalignment. Electronically signed by: Norman Gatlin MD 06/29/2024 07:14 PM EDT RP Workstation: [id number]   DG Knee 1-2 Views Right Result Date: 06/29/2024 EXAM: 1 or 2 VIEW(S) XRAY OF THE KNEE 06/29/2024 07:00:00 PM COMPARISON: None available. CLINICAL HISTORY: Patient coming from home after a mechanical fall this afternoon. EMS reports that she has bilateral bruising on knees, bilateral skin tears on arms, bilateral hip replacements and right hip and leg pain and the right leg is shorter. Patient denies LOC, did not hit head, denies back and neck pain. Recently stopped taking blood thinners. Chronically on 2L of oxygen . FINDINGS: BONES AND JOINTS: No acute fracture. No focal osseous lesion. No joint dislocation. No significant joint effusion. Mild tricompartmental degenerative changes. SOFT TISSUES: Severe atherosclerotic vascular calcifications. IMPRESSION: 1. No acute fracture or dislocation. Electronically signed by: Norman Gatlin MD 06/29/2024 07:12 PM EDT RP Workstation: [id number]   DG Hip Unilat W or Wo Pelvis 2-3 Views Right Result Date: 06/29/2024 EXAM: 2 or more VIEW(S) XRAY OF THE BILATERAL HIP 06/29/2024 07:00:00 PM COMPARISON: None available. CLINICAL HISTORY: Injury. Reason for exam: injury. Triage notes:  Patient coming from home after a mechanical fall this afternoon. EMS reports that she has bilateral bruising on knees, bilateral skin tears on arms, bilateral hip replacements and right hip and leg pain and the right leg is shorter. Patient denies LOC, did not hit head, denies back and neck pain. Recently stopped taking blood thinners. Chronically on 2L of oxygen . FINDINGS: BONES AND JOINTS: Bilateral hip arthroplasties are in place. There is a nondisplaced right proximal femoral shaft fracture just inferior to the greater trochanter. SOFT TISSUES: Iliac and left femoral stents are in place. Vascular calcifications are present. IMPRESSION: 1. Nondisplaced periprosthetic right proximal femoral shaft fracture. Electronically signed by: Norman Gatlin MD 06/29/2024 07:11 PM EDT RP Workstation: [id number]   _______________________________________________________________________________________________________ Latest  Blood pressure (!) 142/76, pulse 93, temperature 98 F (36.7 C), temperature source Oral, resp. rate 20, SpO2 93%.   Vitals  labs and radiology finding personally reviewed  Review of Systems:    Pertinent positives include:  right hip pain  Constitutional:  No weight loss, night sweats, Fevers, chills, fatigue, weight loss  HEENT:  No  headaches, Difficulty swallowing,Tooth/dental problems,Sore throat,  No sneezing, itching, ear ache, nasal congestion, post nasal drip,  Cardio-vascular:  No chest pain, Orthopnea, PND, anasarca, dizziness, palpitations.no Bilateral lower extremity swelling  GI:  No heartburn, indigestion, abdominal pain, nausea, vomiting, diarrhea, change in bowel habits, loss of appetite, melena, blood in stool, hematemesis Resp:  no shortness of breath at rest. No dyspnea on exertion, No excess mucus, no productive cough, No non-productive cough, No coughing up of blood.No change in color of mucus.No wheezing. Skin:  no rash or lesions. No jaundice GU:  no dysuria,  change in color of urine, no urgency or frequency. No straining to urinate.  No flank pain.  Musculoskeletal:  No joint pain or no joint swelling. No decreased range of motion. No back pain.  Psych:  No change in mood or affect. No depression or anxiety. No memory loss.  Neuro: no localizing neurological complaints, no tingling, no weakness, no double vision, no gait abnormality, no slurred speech, no confusion  All systems reviewed and apart from HOPI all are negative _______________________________________________________________________________________________ Past Medical History:   Past Medical History:  Diagnosis Date   AAA (abdominal aortic aneurysm)    3.4 cm   Anemia    Anxiety    Aortic atherosclerosis    Arthritis    Asthma    CAD (coronary artery disease)    a. Nonobstructive CAD by cath 07/17/13.   [facility]cer [facility])    Carotid artery disease    Carotid US  8/22: Bilateral ICA 1-39; right subclavian stenosis; right thyroid  nodule   CHD (congenital heart disease)    pt unaware???   Chest pain    a. Adm 10-07/2013 - CTA neg for PE/dissection, cath with nonobstructive disease, CP ?due to uncontrolled HTN vs coronary vasospasm.   CHF (congestive heart failure) (HCC)    Closed head injury with concussion    COPD (chronic obstructive pulmonary disease) (HCC)    Coronary artery disease    Echocardiogram    Echocardiogram 06/2019: EF 60-65, impaired relaxation (Gr 1 DD), mild MAC, mild MR, mild to mod aortic valve sclerosis (no AS), trivial TR, mild LAE, normal RVSF, RVSP 30.7 (mildly elevated)   GERD (gastroesophageal reflux disease)    History of blood transfusion    childbirth, Hysterectomy   Hyperlipidemia    Hypertension    Neuropathy    OA (osteoarthritis)    Osteoporosis    PAD (peripheral artery disease)    a. s/p L fem-pop bypass b. recent evaluation at [facility], unamenable to intervention   PAD (peripheral artery disease)    Palpitations 02/22/2016   Pneumonia     Pre-diabetes    Premature ventricular contraction    PUD (peptic ulcer disease)    PVD (peripheral vascular disease) 07/17/2013   Thoracic aortic aneurysm    Chest/aorta CTA 04/2022: 14 x 8 focal saccular aneurysm versus penetrating atherosclerotic ulcer involving left side of proximal portion of transverse aortic arch; descending thoracic aorta and proximal abdominal aorta aneurysm 4.4 cm; aortic atherosclerosis; emphysema   Tobacco abuse    Wears dentures    Wears glasses       Past Surgical History:  Procedure Laterality Date   ABDOMINAL AORTOGRAM W/LOWER EXTREMITY N/A 03/09/2020   Procedure: ABDOMINAL AORTOGRAM W/LOWER EXTREMITY;  Surgeon: Gretta Lonni PARAS, MD;  Location: [facility] INVASIVE CV LAB;  Service: Cardiovascular;  Laterality: N/A;   ABDOMINAL AORTOGRAM W/LOWER EXTREMITY N/A 07/26/2022   Procedure: ABDOMINAL AORTOGRAM W/LOWER EXTREMITY;  Surgeon: Gretta Lonni PARAS, MD;  Location:  [facility] INV[facility] CV [facility];  Service: Cardiovascular;  Laterality: N/A;   ABDOMINAL AORTOGRAM W/LOWER EXTREMITY N/A 09/20/2022   Procedure: ABDOMINAL AORTOGRAM W/LOWER EXTREMITY;  Surgeon: Gretta Lonni PARAS, MD;  Location: [facility] INVASIVE CV [facility];  Service: Cardiovascular;  Laterality: N/A;   ABDOMINAL HYSTERECTOMY     BREAST SURGERY     left cyst excision x 2   CARDIAC CATHETERIZATION     several years ago, nonobstructive, 50-60% blockages   DIRECT LARYNGOSCOPY Bilateral 03/13/2023   Procedure: MICRO DIRECT LARYNGOSCOPY WITH BIOPSY;  Surgeon: Carlie Clark, MD;  Location: [facility] OR;  Service: ENT;  Laterality: Bilateral;   ENDARTERECTOMY FEMORAL Right 03/18/2020   Procedure: ENDARTERECTOMY FEMORAL WITH PROFUNDAPLASTY;  Surgeon: Gretta Lonni PARAS, MD;  Location: [facility] OR;  Service: Vascular;  Laterality: Right;   FEMORAL-POPLITEAL BYPASS GRAFT Right 03/18/2020   Procedure: BYPASS GRAFT FEMORAL-POPLITEAL ARTERY using GORE PROPATEN VASCULAR GRAFT;  Surgeon: Gretta Lonni PARAS, MD;  Location: [facility] OR;  Service: Vascular;   Laterality: Right;   HIP SURGERY     left - bars and screws placed   HIP SURGERY     INSERTION OF ILIAC STENT Right 03/18/2020   Procedure: INSERTION OF GORE VIABAHN  ILIAC STENT;  Surgeon: Gretta Lonni PARAS, MD;  Location: [facility] OR;  Service: Vascular;  Laterality: Right;   LEFT HEART CATH AND CORONARY ANGIOGRAPHY N/A 04/08/2023   Procedure: LEFT HEART CATH AND CORONARY ANGIOGRAPHY;  Surgeon: Anner Alm ORN, MD;  Location: [facility] INVASIVE CV LAB;  Service: Cardiovascular;  Laterality: N/A;   LEFT HEART CATHETERIZATION WITH CORONARY ANGIOGRAM N/A 07/17/2013   Procedure: LEFT HEART CATHETERIZATION WITH CORONARY ANGIOGRAM;  Surgeon: Ezra GORMAN Shuck, MD;  Location: [facility] CATH LAB;  Service: Cardiovascular;  Laterality: N/A;   LOWER EXTREMITY ANGIOGRAM Right 03/18/2020   Procedure: RIGHT ILIAC ARTERIOGRAM WITH AN ILIAC STENT;  Surgeon: Gretta Lonni PARAS, MD;  Location: [facility] OR;  Service: Vascular;  Laterality: Right;   MULTIPLE TOOTH EXTRACTIONS     PATCH ANGIOPLASTY Right 03/18/2020   Procedure: PATCH ANGIOPLASTY using a GEORGE BIOLOGIC PATCH OF THE PROFUNDA;  Surgeon: Gretta Lonni PARAS, MD;  Location: [facility] OR;  Service: Vascular;  Laterality: Right;   PERIPHERAL VASCULAR CATHETERIZATION N/A 07/16/2016   Procedure: Abdominal Aortogram w/Lower Extremity;  Surgeon: Krystal JULIANNA Doing, MD;  Location: [facility] INVASIVE CV LAB;  Service: Cardiovascular;  Laterality: N/A;   PERIPHERAL VASCULAR INTERVENTION Left 03/09/2020   Procedure: PERIPHERAL VASCULAR INTERVENTION;  Surgeon: Gretta Lonni PARAS, MD;  Location: [facility] INVASIVE CV [facility];  Service: Cardiovascular;  Laterality: Left;  common and external illiac    ROTATOR CUFF REPAIR     right side, had torn ligaments as well   TUBAL LIGATION     VEIN BYPASS SURGERY     left leg   WRIST ARTHROCENTESIS     left - broke in 3 places so has bars and screws placed    Social History:  Ambulatory  walker     reports that she has quit smoking. Her smoking use included  cigarettes. She has a 28.5 pack-year smoking history. She has been exposed to tobacco smoke. She has never used smokeless tobacco. She reports that she does not drink alcohol and does not use drugs.     Family History:    Family History  Problem Relation Age of Onset   Cervical cancer Mother    Heart disease Mother        MI in 64s   Liver cancer Father    Heart disease  Father    Lung cancer Maternal Aunt    Prostate cancer Maternal Uncle        met. anal   Breast cancer Maternal Grandmother    Lung cancer Maternal Aunt    Lung cancer Maternal Aunt    Stomach cancer Maternal Aunt    Colon cancer Maternal Aunt    Lung cancer Maternal Uncle    ______________________________________________________________________________________________ Allergies: Allergies  Allergen Reactions   Bee Venom Anaphylaxis and Other (See Comments)    Welts, also    Insect bites- Welts, also   Insect Extract Anaphylaxis and Other (See Comments)    Insect bites- Welts, also   Farxiga  [Dapagliflozin ] Nausea And Vomiting   Lyrica [Pregabalin] Nausea And Vomiting   Neurontin [Gabapentin] Nausea And Vomiting and Other (See Comments)    Hypertension/dizziness   Nsaids Nausea Only and Other (See Comments)    Patient is not suppose to have this class of medication (liver)  Can tolerate 81mg , which she is taking   Latex Rash and Other (See Comments)     Prior to Admission medications   Medication Sig Start Date End Date Taking? Authorizing Provider  albuterol  (PROVENTIL  HFA;VENTOLIN  HFA) 108 (90 BASE) MCG/ACT inhaler Inhale 2 puffs into the lungs every 6 (six) hours as needed for wheezing or shortness of breath.    [provider]  albuterol  (PROVENTIL ) (2.5 MG/3ML) 0.083% nebulizer solution Take 2.5 mg by nebulization every 6 (six) hours as needed for wheezing or shortness of breath.    [provider]  alendronate  (FOSAMAX ) 70 MG tablet Take 70 mg by mouth every Wednesday. Patient  not taking: Reported on 10/15/2023 02/03/16   [provider]  allopurinol  (ZYLOPRIM ) 300 MG tablet Take 300 mg by mouth every evening. 08/02/17   [provider]  aspirin  EC 81 MG tablet Take 81 mg by mouth in the morning. Patient not taking: Reported on 12/12/2023    [provider]  atorvastatin  (LIPITOR ) 80 MG tablet Take 80 mg by mouth at bedtime. 02/04/20   [provider]  bisacodyl  (DULCOLAX) 10 MG suppository Place 1 suppository (10 mg total) rectally daily as needed for moderate constipation. Patient not taking: Reported on 12/12/2023 06/11/23   Sherrill Cable Latif, DO  calcium  carbonate (ANTACID) 500 MG chewable tablet Chew 1 tablet by mouth 2 (two) times daily as needed for heartburn or indigestion.    [provider]  Cholecalciferol (VITAMIN D ) 125 MCG (5000 UT) CAPS Take 5,000 Units by mouth in the morning.    [provider]  clopidogrel  (PLAVIX ) 75 MG tablet TAKE 1 TABLET BY MOUTH AT BEDTIME. 01/17/24   Sheree Penne Bruckner, MD  cyanocobalamin  (VITAMIN B12) 1000 MCG tablet Take 1,000 mcg by mouth daily. Patient not taking: Reported on 10/15/2023    [provider]  Dupilumab  (DUPIXENT ) 300 MG/2ML SOAJ Inject 300 mg into the skin every 14 (fourteen) days. 02/28/24   Assaker, Darrin, MD  EPIPEN  2-PAK 0.3 MG/0.3ML SOAJ injection Inject 0.3 mg into the muscle daily as needed for anaphylaxis. Use as directed as needed for allergies. 12/14/15   [provider]  ezetimibe  (ZETIA ) 10 MG tablet Take 1 tablet (10 mg total) by mouth daily. 02/11/24   Nahser, Aleene PARAS, MD  feeding supplement (ENSURE ENLIVE / ENSURE PLUS) LIQD Take 237 mLs by mouth 2 (two) times daily between meals. Patient not taking: Reported on 12/12/2023 06/11/23   Sherrill Cable Latif, DO  fluticasone -salmeterol (WIXELA INHUB) 500-50 MCG/ACT AEPB Inhale 1 puff  into the lungs in the morning and at bedtime. 12/13/23   Malka Domino, MD  furosemide   (LASIX ) 40 MG tablet Take one tablet by mouth twice daily on Mondays, Wednesdays, and Fridays, then take one tablet by mouth daily on other days. 10/21/23   Nahser, Aleene PARAS, MD  lactulose  (CHRONULAC ) 10 GM/15ML solution Take 20 g by mouth 2 (two) times daily as needed for moderate constipation. Patient not taking: Reported on 12/12/2023 10/29/19   [provider]  leptospermum manuka honey (MEDIHONEY) PSTE paste Apply 1 Application topically daily. Patient not taking: Reported on 12/12/2023 06/12/23   Sherrill Cable Latif, DO  lidocaine  (XYLOCAINE ) 2 % solution PLEASE SEE ATTACHED FOR DETAILED DIRECTIONS Patient not taking: Reported on 12/12/2023 08/21/23   Izell Domino, MD  melatonin 5 MG TABS Take 5 mg by mouth at bedtime. Patient not taking: Reported on 10/15/2023    [provider]  nitroGLYCERIN  (NITROSTAT ) 0.4 MG SL tablet Place 1 tablet (0.4 mg total) under the tongue every 5 (five) minutes x 3 doses as needed for chest pain. 04/26/16   Seiler, Amber K, NP  Oxycodone  HCl 10 MG TABS Take 1 tablet (10 mg total) by mouth 4 (four) times daily as needed (for pain). 03/22/20   Bethanie Cough, PA-C  pantoprazole  (PROTONIX ) 40 MG tablet Take 40 mg by mouth 2 (two) times daily.    [provider]  phenol (CHLORASEPTIC) 1.4 % LIQD Use as directed 1 spray in the mouth or throat as needed for throat irritation / pain. Patient not taking: Reported on 12/12/2023 06/11/23   Sheikh, Omair Latif, DO  polyethylene glycol powder (GLYCOLAX /MIRALAX ) 17 GM/SCOOP powder Mix 17 g of powder in 4 oz of water or juice & drink by mouth daily. Patient not taking: Reported on 12/12/2023 06/11/23   Sherrill Cable Latif, DO  potassium chloride  SA (KLOR-CON  M20) 20 MEQ tablet TAKE 1 TABLET BY MOUTH TWICE DAILY ON MONDAYS, WEDNESDAYS, AND FRIDAYS, THEN TAKE ONE TABLET BY MOUTH DAILY ON OTHER DAYS. 11/14/23   Nahser, Aleene PARAS, MD  senna-docusate (SENOKOT-S) 8.6-50 MG tablet Take 2 tablets by mouth at bedtime.     [provider]  sodium chloride  (OCEAN) 0.65 % SOLN nasal spray Place 1 spray into both nostrils as needed for congestion. Patient not taking: Reported on 12/12/2023 06/11/23   Sherrill Cable Latif, DO  SPIRIVA  RESPIMAT 2.5 MCG/ACT AERS Take 2 puffs by mouth daily. 04/03/23   [provider]  spironolactone  (ALDACTONE ) 25 MG tablet Take 1 tablet (25 mg total) by mouth daily. 09/03/17   Maranda Leim DEL, MD  verapamil  (CALAN -SR) 240 MG CR tablet Take 1 tablet (240 mg total) by mouth daily. 02/11/24   Nahser, Aleene PARAS, MD    ___________________________________________________________________________________________________ Physical Exam:    06/29/2024    8:35 PM 06/29/2024    4:48 PM 05/05/2024   11:18 AM  Vitals with BMI  Height   5' 7.5  Weight   137 lbs 10 oz  BMI   21.22  Systolic 142 134 867  Diastolic 76 61 66  Pulse 93 97 109     1. General:  in No  Acute distress    Chronically ill  -appearing 2. Psychological: Alert and   Oriented 3. Head/ENT:   Dry Mucous Membranes                          Head Non traumatic, neck supple  Poor Dentition 4. SKIN:  decreased Skin turgor,  Skin clean Dry and intact no rash    5. Heart: Regular rate and rhythm no  Murmur, no Rub or gallop 6. Lungs:  no wheezes or crackles    7. Abdomen: Soft,  non-tender, Non distended  8. Lower extremities: no clubbing, cyanosis, no  edema 9. Neurologically Grossly intact, moving all 4 extremities equally   10. MSK: Normal range of motion limited in right  hip    Chart has been reviewed  ______________________________________________________________________________________________  Assessment/Plan 73 y.o. female with medical history significant of COPD on chronic 2 L  CAD, PAD, HTN , HLD, laryngeal cancer   Admitted for right hip fracture   Present on Admission:  Closed right hip fracture (HCC)  COPD (chronic obstructive pulmonary disease) (HCC)  Coronary  artery disease involving native coronary artery of native heart  Chronic diastolic CHF (congestive heart failure) (HCC)  Hypertension  Hyperlipidemia  Tobacco abuse     COPD (chronic obstructive pulmonary disease) (HCC) Chronic stable continue home medications  Coronary artery disease involving native coronary artery of native heart Patient may need surgical intervention we will hold off on aspirin  and Plavix  for tonight if will need surgical intervention will need cardiology consult prior given extensive cardiac history continue Lipitor   Chronic diastolic CHF (congestive heart failure) (HCC) Currently appears to be euvolemic monitor fluid status resume Lasix  when able  Hypertension Chronic stable monitor blood pressure and resume Lasix  when able to tolerate  Closed right hip fracture [facility]) Orthopedics has been consulted it is unclear if patient will need operative intervention or not. Will need further orthopedics consult in the morning for now pain control. If operative intervention will need to be needed would recommend cardiology clearance prior to Obtain chest x-ray and EKG Patient at baseline reports shortness of breath at rest and with exertion given history of CAD significant COPD with oxygen  requirement   Hyperlipidemia Continue Lipitor  40 mg daily  Tobacco abuse None remission    Other plan as per orders.  DVT prophylaxis:  SCD      Code Status:    Code Status: Prior FULL CODE  as per patient   I had personally discussed CODE STATUS with patient   ACP   none   Family Communication:   Family not at  Bedside    Diet heart healthy   Disposition Plan:      To home once workup is complete and patient is stable  Orders following barriers for discharge:                                                         Pain controlled with PO medications                                                        Will need consultants to evaluate patient prior to  discharge                     Consults called: orthopedics   Admission status:  ED Disposition     ED Disposition  Admit   Condition  --  Comment  Hospital Area: [facility] [[id number]]  Level of Care: Telemetry Medical [[id number]]  May admit patient to [facility] or [facility] if equivalent level of care is available:: No  Covid Evaluation: Asymptomatic - no recent exposure (last 10 days) testing not required  Diagnosis: Closed right hip fracture [facility]) [[id number]]  Admitting Physician: Heidi Lemay [[id number]]  Attending Physician: Terre Zabriskie [[id number]]  Certification:: I certify this patient will need inpatient services for at least 2 midnights  Expected Medical Readiness: 07/01/2024            inpatient     I Expect 2 midnight stay secondary to severity of patient's current illness need for inpatient interventions justified by the following:     Severe lab/radiological/exam abnormalities including:   Right hip fracture and extensive comorbidities including:  CHF   CAD   COPD/asthma   That are currently affecting medical management.   I expect  patient to be hospitalized for 2 midnights requiring inpatient medical care.  Patient is at high risk for adverse outcome (such as loss of life or disability) if not treated.  Indication for inpatient stay as follows:   severe pain requiring acute inpatient management,   Need for operative/procedural  intervention  Need for  IV pain medications,     Level of care     tele  For  24H    Raziah Funnell 06/29/2024, 9:03 PM    [facility]     after 2 AM please page floor coverage   If 7AM-7PM, please contact the day team taking care of the patient using [email or web address]

## 2024-06-29 NOTE — ED Triage Notes (Signed)
 Patient coming from home after a mechanical fall this afternoon. EMS reprots that she has bilateral bruising on knees, bilateral skin tears on arms, bilateral hip replacements and right hip and leg pain and the right leg is shorter. Patient denies LOC, did not hit head, denies back and neck pain. Recently stopped taking blood thinners. Chronically on 2L of oxygen .

## 2024-06-29 NOTE — Assessment & Plan Note (Signed)
 -  Continue Lipitor 40 mg daily ?

## 2024-06-29 NOTE — ED Provider Notes (Signed)
 Marie Johnson Provider Note   CSN: [id number] Arrival date & time: 06/29/24  1633     Patient presents with: Marie Johnson is a 73 y.o. female with a past medical history significant for hypertension, hyperlipidemia, COPD, CHF, PAD, tobacco abuse, and laryngeal cancer who presents to the ED after a mechanical fall.  Patient states she was bringing her husband coffee when she got tangled in her oxygen  cord and fell on her right hip.  History of bilateral hip replacement.  Patient admits to significant pain in right hip and low back.  Admits to chronic peripheral neuropathy with no change from baseline following fall.  Denies saddle anesthesia and bowel/bladder incontinence.  Admits to right lower extremity weakness secondary to pain.  Denies any head injury or LOC.  Recently stopped her Plavix  roughly 2 weeks ago.  On ASA 81 mg.  Also sustained some skin tears to right elbow and left forearm. Unsure when her last tetanus shot was; however believes it was greater than 5 years ago. Denies any abdominal pain, chest pain, or shortness of breath.  History obtained from patient and past medical records. No interpreter used during encounter.       Prior to Admission medications   Medication Sig Start Date End Date Taking? Authorizing Provider  albuterol  (PROVENTIL  HFA;VENTOLIN  HFA) 108 (90 BASE) MCG/ACT inhaler Inhale 2 puffs into the lungs every 6 (six) hours as needed for wheezing or shortness of breath.   Yes [provider]  alendronate  (FOSAMAX ) 70 MG tablet Take 70 mg by mouth every Wednesday. 02/03/16  Yes [provider]  allopurinol  (ZYLOPRIM ) 300 MG tablet Take 300 mg by mouth every evening. 08/02/17  Yes [provider]  aspirin  EC 81 MG tablet Take 81 mg by mouth in the morning.   Yes [provider]  atorvastatin  (LIPITOR ) 80 MG tablet Take 80 mg by mouth at bedtime. 02/04/20  Yes [provider]  calcium  carbonate (ANTACID) 500 MG chewable tablet Chew 1 tablet by mouth 2 (two) times daily as needed for heartburn or indigestion.   Yes [provider]  Cholecalciferol (VITAMIN D ) 125 MCG (5000 UT) CAPS Take 5,000 Units by mouth in the morning.   Yes [provider]  cyanocobalamin  (VITAMIN B12) 1000 MCG tablet Take 1,000 mcg by mouth daily.   Yes [provider]  Dupilumab  (DUPIXENT ) 300 MG/2ML SOAJ Inject 300 mg into the skin every 14 (fourteen) days. 02/28/24  Yes Assaker, Darrin, MD  EPIPEN  2-PAK 0.3 MG/0.3ML SOAJ injection Inject 0.3 mg into the muscle daily as needed for anaphylaxis. Use as directed as needed for allergies. 12/14/15  Yes [provider]  ezetimibe  (ZETIA ) 10 MG tablet Take 1 tablet (10 mg total) by mouth daily. 02/11/24  Yes Nahser, Aleene PARAS, MD  fluticasone -salmeterol (WIXELA INHUB) 500-50 MCG/ACT AEPB Inhale 1 puff into the lungs in the morning and at bedtime. 12/13/23  Yes Assaker, Darrin, MD  furosemide  (LASIX ) 40 MG tablet Take one tablet by mouth twice daily on Mondays, Wednesdays, and Fridays, then take one tablet by mouth daily on other days. 10/21/23  Yes Nahser, Aleene PARAS, MD  Oxycodone  HCl 10 MG TABS Take 1 tablet (10 mg total) by mouth 4 (four) times daily as needed (for pain). 03/22/20  Yes Bethanie Cough, PA-C  pantoprazole  (PROTONIX ) 40 MG tablet Take 40 mg by mouth 2 (two) times daily.   Yes [provider]  potassium chloride   SA (KLOR-CON  M20) 20 MEQ tablet TAKE 1 TABLET BY MOUTH TWICE DAILY ON MONDAYS, WEDNESDAYS, AND FRIDAYS, THEN TAKE ONE TABLET BY MOUTH DAILY ON OTHER DAYS. 11/14/23  Yes Nahser, Aleene PARAS, MD  senna-docusate (SENOKOT-S) 8.6-50 MG tablet Take 2 tablets by mouth at bedtime.   Yes [provider]  SPIRIVA  RESPIMAT 2.5 MCG/ACT AERS Take 2 puffs by mouth daily. 04/03/23  Yes [provider]  spironolactone  (ALDACTONE ) 25 MG tablet Take 1 tablet (25 mg total) by mouth daily.  09/03/17  Yes Maranda Leim DEL, MD  verapamil  (CALAN -SR) 240 MG CR tablet Take 1 tablet (240 mg total) by mouth daily. 02/11/24  Yes Nahser, Aleene PARAS, MD  albuterol  (PROVENTIL ) (2.5 MG/3ML) 0.083% nebulizer solution Take 2.5 mg by nebulization every 6 (six) hours as needed for wheezing or shortness of breath. Patient not taking: Reported on 06/29/2024    [provider]  nitroGLYCERIN  (NITROSTAT ) 0.4 MG SL tablet Place 1 tablet (0.4 mg total) under the tongue every 5 (five) minutes x 3 doses as needed for chest pain. Patient not taking: Reported on 06/29/2024 04/26/16   Seiler, Amber K, NP    Allergies: Bee venom, Insect extract, Farxiga  [dapagliflozin ], Lyrica [pregabalin], Neurontin [gabapentin], Nsaids, and Latex    Review of Systems  Respiratory:  Negative for shortness of breath.   Cardiovascular:  Negative for chest pain.  Gastrointestinal:  Negative for abdominal pain.  Musculoskeletal:  Positive for arthralgias, back pain and gait problem.    Updated Vital Signs BP (!) 151/84 (BP Location: Left Arm)   Pulse (!) 101   Temp 98.1 F (36.7 C)   Resp 20   SpO2 95%   Physical Exam Vitals and nursing note reviewed.  Constitutional:      General: She is not in acute distress.    Appearance: She is not ill-appearing.  HENT:     Head: Normocephalic.  Eyes:     Pupils: Pupils are equal, round, and reactive to light.  Cardiovascular:     Rate and Rhythm: Normal rate and regular rhythm.     Pulses: Normal pulses.     Heart sounds: Normal heart sounds. No murmur heard.    No friction rub. No gallop.  Pulmonary:     Effort: Pulmonary effort is normal.     Breath sounds: Normal breath sounds.  Abdominal:     General: Abdomen is flat. There is no distension.     Palpations: Abdomen is soft.     Tenderness: There is no abdominal tenderness. There is no guarding or rebound.  Musculoskeletal:        General: Normal range of motion.     Cervical back: Neck supple.      Comments: Bony tenderness to right hip with decreased range of motion.  Ecchymosis to right knee.  Skin:    General: Skin is warm and dry.     Comments: Superficial skin tears to right elbow and left forearm.  Neurological:     General: No focal deficit present.     Mental Status: She is alert.  Psychiatric:        Mood and Affect: Mood normal.        Behavior: Behavior normal.     (all labs ordered are listed, but only abnormal results are displayed) Labs Reviewed  CBC WITH DIFFERENTIAL/PLATELET - Abnormal; Notable for the following components:      Result Value   WBC 10.6 (*)    RBC 2.87 (*)  Hemoglobin 9.3 (*)    HCT 29.3 (*)    MCV 102.1 (*)    RDW 23.9 (*)    nRBC 0.8 (*)    Neutro Abs 9.9 (*)    Lymphs Abs 0.4 (*)    All other components within normal limits  BASIC METABOLIC PANEL WITH GFR - Abnormal; Notable for the following components:   Glucose, Bld 103 (*)    All other components within normal limits  FERRITIN - Abnormal; Notable for the following components:   Ferritin 475 (*)    All other components within normal limits  RETICULOCYTES - Abnormal; Notable for the following components:   RBC. 3.10 (*)    All other components within normal limits  MAGNESIUM  - Abnormal; Notable for the following components:   Magnesium  1.6 (*)    All other components within normal limits  VITAMIN B12  FOLATE  IRON AND TIBC  CK  PHOSPHORUS  CBC  COMPREHENSIVE METABOLIC PANEL WITH GFR  VITAMIN D  25 HYDROXY (VIT D DEFICIENCY, FRACTURES)  PATHOLOGIST SMEAR REVIEW  TYPE AND SCREEN    EKG: None  Radiology: CT Hip Right Wo Contrast Result Date: 06/29/2024 CLINICAL DATA:  Hip replacement, periprosthetic fracture suspected EXAM: CT OF THE RIGHT HIP WITHOUT CONTRAST TECHNIQUE: Multidetector CT imaging of the right hip was performed according to the standard protocol. Multiplanar CT image reconstructions were also generated. RADIATION DOSE REDUCTION: This exam was performed  according to the departmental dose-optimization program which includes automated exposure control, adjustment of the mA and/or kV according to patient size and/or use of iterative reconstruction technique. COMPARISON:  Radiographs 06/29/2024 and 05/11/2021. FINDINGS: Bones/Joint/Cartilage Patient is status post bilateral total hip arthroplasty with associated mild beam hardening artifact. As demonstrated on the earlier radiographs, there is an acute periprosthetic fracture of the proximal right femur with up to 7 mm of medial displacement laterally. Fracture extends superiorly into the greater trochanter. No hardware loosening or displacement identified. There is no dislocation or fracture of the right hemipelvis. No significant hip joint effusion. There are mild sacroiliac degenerative changes bilaterally and multilevel lower lumbar spondylosis. Ligaments Suboptimally assessed by CT. Muscles and Tendons No focal or acute muscular findings are demonstrated. Soft tissues Ill-defined subcutaneous hematoma posterolateral to the proximal right femur, measuring up to 6.1 cm on coronal image 35/7. No organized fluid collection or other significant hematoma demonstrated. Status post femoral bypass grafting bilaterally. Underlying iliofemoral atherosclerosis. Possible small dependent gallstones in the gallbladder lumen. Previous hysterectomy noted. IMPRESSION: 1. Acute periprosthetic fracture of the proximal right femur as described. No hardware loosening or displacement identified. 2. Ill-defined subcutaneous hematoma posterolaterally. 3. No evidence of dislocation or fracture of the right hemipelvis. 4. Possible cholelithiasis. Electronically Signed   By: Elsie Perone M.D.   On: 06/29/2024 21:38   DG Chest 1 View Result Date: 06/29/2024 CLINICAL DATA:  Preop EXAM: CHEST  1 VIEW COMPARISON:  06/08/2023 FINDINGS: Cardiomegaly. There is hyperinflation of the lungs compatible with COPD. Bibasilar opacities likely  reflect scarring or atelectasis. No effusions. No acute bony abnormality. IMPRESSION: Cardiomegaly, COPD. Bibasilar atelectasis or scarring. No active disease. Electronically Signed   By: Franky Crease M.D.   On: 06/29/2024 21:17   DG Elbow Complete Right Result Date: 06/29/2024 EXAM: 3 VIEW(S) XRAY OF THE _LATERALITY_ ELBOW COMPARISON: None available. CLINICAL HISTORY: injury. Reason for exam: injury; Triage notes: Patient coming from home after a mechanical fall this afternoon. EMS reprots that she has bilateral bruising on knees, bilateral skin tears on arms,  bilateral hip replacements and right hip and leg pain and the right leg is shorter. ; Patient denies LOC, did not hit head, denies back and neck pain. Recently stopped taking blood thinners. Chronically on 2L of oxygen . FINDINGS: BONES AND JOINTS: No acute fracture. No focal osseous lesion. No joint dislocation. No joint effusion. SOFT TISSUES: The soft tissues are unremarkable. IMPRESSION: 1. No acute abnormality. Electronically signed by: Norman Gatlin MD 06/29/2024 07:16 PM EDT RP Workstation: [id number]   DG Forearm Left Result Date: 06/29/2024 EXAM: VIEW(S) XRAY OF THE LEFT FOREARM 06/29/2024 07:00:00 PM COMPARISON: None available. CLINICAL HISTORY: Injury. Reason for exam: injury; Triage notes: Patient coming from home after a mechanical fall this afternoon. EMS reports that she has bilateral bruising on knees, bilateral skin tears on arms, bilateral hip replacements and right hip and leg pain and the right leg is shorter. FINDINGS: BONES AND JOINTS: Plate and screw fixation of distal radius in place. Chronic ulnar styloid process fracture. Chronic deformity of distal ulna. Advanced first CMC and triscaphe degenerative change. No joint dislocation. SOFT TISSUES: Soft tissue swelling of distal forearm. IMPRESSION: 1. No acute fracture or dislocation. Electronically signed by: Norman Gatlin MD 06/29/2024 07:16 PM EDT RP Workstation: [id number]    DG Lumbar Spine 2-3 Views Result Date: 06/29/2024 EXAM: 2 or 3 VIEW(S) XRAY OF THE LUMBAR SPINE 06/29/2024 07:00:00 PM COMPARISON: None available. CLINICAL HISTORY: [id number] Injury [id number]. Reason for exam: injury; Triage notes: Patient coming from home after a mechanical fall this afternoon. EMS reprots that she has bilateral bruising on knees, bilateral skin tears on arms, bilateral hip replacements and right hip and leg pain and the right leg is shorter. Patient denies LOC, did not hit head, denies back and neck pain. Recently stopped taking blood thinners. Chronically on 2L of oxygen . FINDINGS: LUMBAR SPINE: BONES: Demineralization and degenerative changes limiting assessment for subtle fractures. No definite acute fracture or evidence of traumatic malalignment. No aggressive appearing osseous lesion. DISCS AND DEGENERATIVE CHANGES: Degenerative changes are present. No severe degenerative changes. SOFT TISSUES: No acute abnormality. IMPRESSION: 1. No evidence of acute fracture or traumatic malalignment. Electronically signed by: Norman Gatlin MD 06/29/2024 07:14 PM EDT RP Workstation: [id number]   DG Knee 1-2 Views Right Result Date: 06/29/2024 EXAM: 1 or 2 VIEW(S) XRAY OF THE KNEE 06/29/2024 07:00:00 PM COMPARISON: None available. CLINICAL HISTORY: Patient coming from home after a mechanical fall this afternoon. EMS reports that she has bilateral bruising on knees, bilateral skin tears on arms, bilateral hip replacements and right hip and leg pain and the right leg is shorter. Patient denies LOC, did not hit head, denies back and neck pain. Recently stopped taking blood thinners. Chronically on 2L of oxygen . FINDINGS: BONES AND JOINTS: No acute fracture. No focal osseous lesion. No joint dislocation. No significant joint effusion. Mild tricompartmental degenerative changes. SOFT TISSUES: Severe atherosclerotic vascular calcifications. IMPRESSION: 1. No acute fracture or dislocation. Electronically  signed by: Norman Gatlin MD 06/29/2024 07:12 PM EDT RP Workstation: [id number]   DG Hip Unilat W or Wo Pelvis 2-3 Views Right Result Date: 06/29/2024 EXAM: 2 or more VIEW(S) XRAY OF THE BILATERAL HIP 06/29/2024 07:00:00 PM COMPARISON: None available. CLINICAL HISTORY: Injury. Reason for exam: injury. Triage notes: Patient coming from home after a mechanical fall this afternoon. EMS reports that she has bilateral bruising on knees, bilateral skin tears on arms, bilateral hip replacements and right hip and leg pain and the right leg is shorter. Patient denies LOC, did not hit head, denies  back and neck pain. Recently stopped taking blood thinners. Chronically on 2L of oxygen . FINDINGS: BONES AND JOINTS: Bilateral hip arthroplasties are in place. There is a nondisplaced right proximal femoral shaft fracture just inferior to the greater trochanter. SOFT TISSUES: Iliac and left femoral stents are in place. Vascular calcifications are present. IMPRESSION: 1. Nondisplaced periprosthetic right proximal femoral shaft fracture. Electronically signed by: Norman Gatlin MD 06/29/2024 07:11 PM EDT RP Workstation: [id number]     Procedures   Medications Ordered in the ED  oxyCODONE  (Oxy IR/ROXICODONE ) immediate release tablet 10 mg (has no administration in time range)  atorvastatin  (LIPITOR ) tablet 80 mg (has no administration in time range)  ezetimibe  (ZETIA ) tablet 10 mg (has no administration in time range)  verapamil  (CALAN -SR) CR tablet 240 mg (has no administration in time range)  pantoprazole  (PROTONIX ) EC tablet 40 mg (has no administration in time range)  methocarbamol (ROBAXIN) tablet 500 mg (has no administration in time range)    Or  methocarbamol (ROBAXIN) injection 500 mg (has no administration in time range)  HYDROmorphone  (DILAUDID ) injection 0.5 mg (0.5 mg Intravenous Given 06/29/24 2251)  magnesium  sulfate IVPB 1 g 100 mL (has no administration in time range)  Tdap (ADACEL) injection 0.5  mL (0.5 mLs Intramuscular Given 06/29/24 1925)  morphine  (PF) 4 MG/ML injection 4 mg (4 mg Intravenous Given 06/29/24 1920)                                    Medical Decision Making Amount and/or Complexity of Data Reviewed Independent Historian: caregiver Labs: ordered. Decision-making details documented in ED Course. Radiology: ordered and independent interpretation performed. Decision-making details documented in ED Course.  Risk Prescription drug management. Decision regarding hospitalization.   This patient presents to the ED for concern of fall, this involves an extensive number of treatment options, and is a complaint that carries with it a high risk of complications and morbidity.  The differential diagnosis includes bony fracture, dislocation, sprain/strain, contusion, etc  73 year old female presents to the ED after a mechanical fall.  No head injury or LOC.  Recently stopped Plavix  2 weeks ago per patient.  Upon arrival, vitals all within normal limits.  Patient on 2 L nasal cannula at baseline for COPD.  On exam patient has bony tenderness to right hip.  Previous bilateral hip replacement.  Also has skin tears to right elbow and left forearm. No suture repair warranted.  Updated tetanus here in the ED.  X-rays ordered to rule out bony fractures.  Routine labs ordered.  Patient given hydrocodone  for pain management.  X-rays personally reviewed and interpreted which demonstrates a nondisplaced periprosthetic right proximal femoral shaft fracture.  All other x-rays negative.  Right lower extremity neurovascularly intact with soft compartments.  Low suspicion for compartment syndrome.  Will discuss with orthopedics.  8:00 PM Discussed with Dr. Elsa with orthopedics who notes prosthesis appears stable.  Recommends medicine admission and orthopedics will follow.  Likely nonoperative however, will consult with hip specialist in the AM. CT ordered.  8:32 PM Discussed with Dr. Silvester  with [facility] who agrees to admit patient.  Co morbidities that complicate the patient evaluation  COPD, HTN, HL  Social Determinants of Health:  Tobacco abuse  Test / Admission - Considered:  Patient will require admission for pain management.      Final diagnoses:  Fall, initial encounter  Closed fracture of shaft of right femur,  unspecified fracture morphology, initial encounter [facility])    ED Discharge Orders     None          Lorelle Aleck BROCKS, PA-C 06/29/24 2349    Elnor Hila P, DO 07/10/24 1340

## 2024-06-29 NOTE — Assessment & Plan Note (Signed)
 Chronic stable monitor blood pressure and resume Lasix  when able to tolerate

## 2024-06-29 NOTE — Assessment & Plan Note (Signed)
 Chronic stable continue home medications ?

## 2024-06-29 NOTE — Assessment & Plan Note (Signed)
 None remission

## 2024-06-29 NOTE — Assessment & Plan Note (Signed)
 Patient may need surgical intervention we will hold off on aspirin  and Plavix  for tonight if will need surgical intervention will need cardiology consult prior given extensive cardiac history continue Lipitor 

## 2024-06-29 NOTE — Assessment & Plan Note (Addendum)
 Orthopedics has been consulted it is unclear if patient will need operative intervention or not. Will need further orthopedics consult in the morning for now pain control. If operative intervention will need to be needed would recommend cardiology clearance prior to Obtain chest x-ray and EKG Patient at baseline reports shortness of breath at rest and with exertion given history of CAD significant COPD with oxygen  requirement

## 2024-06-29 NOTE — Subjective & Objective (Signed)
 Pt is on chronic O2 for hx of COPD was walking to porch to bring husband coffee when tripped on her o2 tubing and fell , landed on right side  No head trauma, no LOC Patient reports shortness of breath at baseline and with activity She walks with a walker past history of bilateral hip replacements Imaging showed Periprostatic fracture Orthopedics consulted will see patient in the morning fracture appears stable may not need operative intervention but will need further evaluation with hip repair specialist Patient denies alcohol abuse or tobacco abuse currently

## 2024-06-29 NOTE — Assessment & Plan Note (Signed)
 Currently appears to be euvolemic monitor fluid status resume Lasix  when able

## 2024-06-30 ENCOUNTER — Inpatient Hospital Stay (HOSPITAL_COMMUNITY)

## 2024-06-30 DIAGNOSIS — Z01810 Encounter for preprocedural cardiovascular examination: Secondary | ICD-10-CM | POA: Diagnosis not present

## 2024-06-30 DIAGNOSIS — S72001A Fracture of unspecified part of neck of right femur, initial encounter for closed fracture: Secondary | ICD-10-CM | POA: Diagnosis not present

## 2024-06-30 DIAGNOSIS — I517 Cardiomegaly: Secondary | ICD-10-CM | POA: Diagnosis not present

## 2024-06-30 DIAGNOSIS — E440 Moderate protein-calorie malnutrition: Secondary | ICD-10-CM | POA: Insufficient documentation

## 2024-06-30 LAB — COMPREHENSIVE METABOLIC PANEL WITH GFR
ALT: 11 U/L (ref 0–44)
AST: 17 U/L (ref 15–41)
Albumin: 3.5 g/dL (ref 3.5–5.0)
Alkaline Phosphatase: 70 U/L (ref 38–126)
Anion gap: 14 (ref 5–15)
BUN: 16 mg/dL (ref 8–23)
CO2: 26 mmol/L (ref 22–32)
Calcium: 8.9 mg/dL (ref 8.9–10.3)
Chloride: 96 mmol/L — ABNORMAL LOW (ref 98–111)
Creatinine, Ser: 0.85 mg/dL (ref 0.44–1.00)
GFR, Estimated: >60 mL/min (ref >60)
Glucose, Bld: 118 mg/dL — ABNORMAL HIGH (ref 70–99)
Potassium: 3.6 mmol/L (ref 3.5–5.1)
Sodium: 136 mmol/L (ref 135–145)
Total Bilirubin: 1.6 mg/dL — ABNORMAL HIGH (ref 0.0–1.2)
Total Protein: 6.7 g/dL (ref 6.5–8.1)

## 2024-06-30 LAB — VITAMIN D 25 HYDROXY (VIT D DEFICIENCY, FRACTURES): Vit D, 25-Hydroxy: 32.8 ng/mL (ref 30–100)

## 2024-06-30 LAB — CBC
HCT: 27.3 % — ABNORMAL LOW (ref 36.0–46.0)
Hemoglobin: 8.8 g/dL — ABNORMAL LOW (ref 12.0–15.0)
MCH: 32.2 pg (ref 26.0–34.0)
MCHC: 32.2 g/dL (ref 30.0–36.0)
MCV: 100 fL (ref 80.0–100.0)
Platelets: 300 K/uL (ref 150–400)
RBC: 2.73 MIL/uL — ABNORMAL LOW (ref 3.87–5.11)
RDW: 23.7 % — ABNORMAL HIGH (ref 11.5–15.5)
WBC: 6.1 K/uL (ref 4.0–10.5)
nRBC: 1.8 % — ABNORMAL HIGH (ref 0.0–0.2)

## 2024-06-30 LAB — ECHOCARDIOGRAM COMPLETE
Area-P 1/2: 5.84 cm2
Calc EF: 46.4 %
S' Lateral: 2.9 cm
Single Plane A2C EF: 42.4 %
Single Plane A4C EF: 50.1 %

## 2024-06-30 LAB — PATHOLOGIST SMEAR REVIEW

## 2024-06-30 MED ORDER — FLUTICASONE FUROATE-VILANTEROL 200-25 MCG/ACT IN AEPB
1.0000 | INHALATION_SPRAY | Freq: Every day | RESPIRATORY_TRACT | Status: DC
Start: 2024-06-30 — End: 2024-07-05
  Administered 2024-07-01 – 2024-07-05 (×4): 1 via RESPIRATORY_TRACT
  Filled 2024-06-30: qty 28

## 2024-06-30 MED ORDER — ENSURE PLUS HIGH PROTEIN PO LIQD
237.0000 mL | Freq: Three times a day (TID) | ORAL | Status: DC
  Administered 2024-06-30 – 2024-07-08 (×8): 237 mL via ORAL

## 2024-06-30 MED ORDER — TIOTROPIUM BROMIDE 2.5 MCG/ACT IN AERS: 2.0000 | INHALATION_SPRAY | Freq: Every day | RESPIRATORY_TRACT | Status: DC

## 2024-06-30 MED ORDER — VITAMIN B-12 1000 MCG PO TABS
1000.0000 ug | ORAL_TABLET | Freq: Every day | ORAL | Status: DC
  Administered 2024-06-30 – 2024-07-03 (×4): 1000 ug via ORAL
  Filled 2024-06-30 (×4): qty 1

## 2024-06-30 MED ORDER — ALLOPURINOL 300 MG PO TABS
300.0000 mg | ORAL_TABLET | Freq: Every evening | ORAL | Status: DC
  Administered 2024-06-30 – 2024-07-12 (×9): 300 mg via ORAL
  Filled 2024-06-30 (×11): qty 1

## 2024-06-30 MED ORDER — ADULT MULTIVITAMIN W/MINERALS CH
1.0000 | ORAL_TABLET | Freq: Every day | ORAL | Status: DC
  Administered 2024-07-01 – 2024-07-08 (×5): 1 via ORAL
  Filled 2024-06-30 (×6): qty 1

## 2024-06-30 MED ORDER — ALBUTEROL SULFATE (2.5 MG/3ML) 0.083% IN NEBU
2.5000 mg | INHALATION_SOLUTION | RESPIRATORY_TRACT | Status: DC | PRN
  Administered 2024-07-05 – 2024-07-16 (×14): 2.5 mg via RESPIRATORY_TRACT
  Filled 2024-06-30 (×14): qty 3

## 2024-06-30 MED ORDER — UMECLIDINIUM BROMIDE 62.5 MCG/ACT IN AEPB
1.0000 | INHALATION_SPRAY | Freq: Every day | RESPIRATORY_TRACT | Status: DC
  Administered 2024-06-30 – 2024-07-12 (×12): 1 via RESPIRATORY_TRACT
  Filled 2024-06-30 (×2): qty 7

## 2024-06-30 NOTE — Progress Notes (Addendum)
 PROGRESS NOTE  Marie Johnson FMW:[id number] DOB: May 26, 1951 DOA: 06/29/2024 PCP: Marie Charlene CROME, MD  HPI/Recap of past 24 hours: Marie Johnson is a 73 y.o. female with medical history significant of COPD on chronic 2 L CAD, PAD, HTN ,HLD, bilateral hip replacements presented with mechanical fall after she tripped on her oxygen  tubing resulting in right hip pain. Denies head trauma, no LOC.  In the ED, vital signs fairly stable, saturating well on 2 L of O2 (at baseline), labs noted with WBC 10.6, hemoglobin 9.3, otherwise unremarkable.  Orthopedics consulted.  Admitted for further management.    Today, patient having ECHO at bedside. Reports R hip pain worse upon movement. Denies any new complaints.    Assessment/Plan: Principal Problem:   Closed right hip fracture (HCC) Active Problems:   Coronary artery disease involving native coronary artery of native heart   COPD (chronic obstructive pulmonary disease) (HCC)   Hypertension   Chronic diastolic CHF (congestive heart failure) (HCC)   Tobacco abuse   Hyperlipidemia   Acute periprosthetic fracture of the proximal right femur  Mechanical fall CT showed above Orthopedics consulted  Pain management Fall precautions   Chronic hypoxic respiratory failure COPD Appears stable at baseline, patient is on 2 L of O2 Chest x-ray showed hyperinflation of the lungs compatible with COPD, bibasilar atelectasis Continue inhalers, DuoNebs as needed Continue home O2  Normocytic anemia Anemia of chronic disease Vitamin B12 deficiency Baseline hemoglobin around 10-11 No evidence of significant bleeding, although CT of hip showed some ill-defined subcutaneous hematoma Anemia panel WNL, except for vitamin B12--> 371 Vitamin B12 supplementation Trend CBC, monitor closely  Hypomagnesemia Replaced as needed  CAD Hyperlipidemia Denies any chest pain Hold off on ASA, Plavix  pending orthopedic consultation Continue  Lipitor   Chronic diastolic HF Currently appears euvolemic Repeat echo with EF 60 to 65%, no regional wall motion abnormality Hold home Lasix , Aldactone  for now, continue with verapamil  and monitor closely  Hypertension BP somewhat uncontrolled likely due to pain Management as above   Estimated body mass index is 20.82 kg/m as calculated from the following:   Height as of 05/05/24: 5' 7.5 (1.715 m).   Weight as of this encounter: 61.2 kg.     Code Status: Full  Family Communication: None at bedside  Disposition Plan: Status is: Inpatient Remains inpatient appropriate because: Level of care      Consultants: Orthopedic consulted  Procedures: None  Antimicrobials: None  DVT prophylaxis: SCDs pending orthopedics   Objective: Vitals:   06/29/24 2346 06/30/24 0403 06/30/24 0807 06/30/24 1100  BP: (!) 158/74 (!) 146/70 (!) 162/87   Pulse: (!) 103 91 95   Resp:  16 18   Temp:  98.9 F (37.2 C) 98.2 F (36.8 C)   TempSrc:  Oral Oral   SpO2: 91% 94% 95%   Weight:    61.2 kg    Intake/Output Summary (Last 24 hours) at 06/30/2024 1250 Last data filed at 06/30/2024 1100 Gross per 24 hour  Intake 520 ml  Output --  Net 520 ml   Filed Weights   06/30/24 1100  Weight: 61.2 kg    Exam: General: NAD, elderly Cardiovascular: S1, S2 present Respiratory: Diminished breath sounds bilaterally Abdomen: Soft, nontender, nondistended, bowel sounds present Musculoskeletal: No bilateral pedal edema noted, bruising noted around right knee Skin: Noted bruising Psychiatry: Normal mood     Data Reviewed: CBC: Recent Labs  Lab 06/29/24 1910 06/30/24 0439  WBC 10.6* 6.1  NEUTROABS 9.9*  --  HGB 9.3* 8.8*  HCT 29.3* 27.3*  MCV 102.1* 100.0  PLT 304 300   Basic Metabolic Panel: Recent Labs  Lab 06/29/24 1910 06/29/24 2243 06/30/24 0439  NA 138  --  136  K 3.6  --  3.6  CL 99  --  96*  CO2 29  --  26  GLUCOSE 103*  --  118*  BUN 15  --  16   CREATININE 0.85  --  0.85  CALCIUM  9.3  --  8.9  MG  --  1.6*  --   PHOS  --  2.7  --    GFR: Estimated Creatinine Clearance: 55.3 mL/min (by C-G formula based on SCr of 0.85 mg/dL). Liver Function Tests: Recent Labs  Lab 06/30/24 0439  AST 17  ALT 11  ALKPHOS 70  BILITOT 1.6*  PROT 6.7  ALBUMIN  3.5   No results for input(s): LIPASE, AMYLASE in the last 168 hours. No results for input(s): AMMONIA in the last 168 hours. Coagulation Profile: No results for input(s): INR, PROTIME in the last 168 hours. Cardiac Enzymes: Recent Labs  Lab 06/29/24 2243  CKTOTAL 114   BNP (last 3 results) No results for input(s): PROBNP in the last 8760 hours. HbA1C: No results for input(s): HGBA1C in the last 72 hours. CBG: No results for input(s): GLUCAP in the last 168 hours. Lipid Profile: No results for input(s): CHOL, HDL, LDLCALC, TRIG, CHOLHDL, LDLDIRECT in the last 72 hours. Thyroid  Function Tests: No results for input(s): TSH, T4TOTAL, FREET4, T3FREE, THYROIDAB in the last 72 hours. Anemia Panel: Recent Labs    06/29/24 2243  VITAMINB12 371  FOLATE 16.9  FERRITIN 475*  TIBC 328  IRON 61  RETICCTPCT 1.2   Urine analysis:    Component Value Date/Time   COLORURINE STRAW (A) 03/15/2020 1007   APPEARANCEUR CLEAR 03/15/2020 1007   LABSPEC 1.005 03/15/2020 1007   PHURINE 6.0 03/15/2020 1007   GLUCOSEU NEGATIVE 03/15/2020 1007   HGBUR SMALL (A) 03/15/2020 1007   BILIRUBINUR NEGATIVE 03/15/2020 1007   KETONESUR NEGATIVE 03/15/2020 1007   PROTEINUR NEGATIVE 03/15/2020 1007   NITRITE NEGATIVE 03/15/2020 1007   LEUKOCYTESUR NEGATIVE 03/15/2020 1007   Sepsis Labs: @LABRCNTIP (procalcitonin:4,lacticidven:4)  )No results found for this or any previous visit (from the past 240 hours).    Studies: ECHOCARDIOGRAM COMPLETE Result Date: 06/30/2024    ECHOCARDIOGRAM REPORT   Patient Name:   Marie Johnson Date of Exam: 06/30/2024 Medical  Rec #:  [id number]      Height:       67.5 in Accession #:    [id number]     Weight:       137.6 lb Date of Birth:  1951-03-05      BSA:          1.735 m Patient Age:    73 years       BP:           146/70 mmHg Patient Gender: F              HR:           95 bpm. Exam Location:  Inpatient Procedure: 2D Echo, Cardiac Doppler and Color Doppler (Both Spectral and Color            Flow Doppler were utilized during procedure). Indications:    Cardiomegaly, preoperative evaluation  History:        Patient has prior history of Echocardiogram examinations, most  recent 03/22/2016. CHF, CAD, COPD, Signs/Symptoms:Chest Pain; Risk                 Factors:Current Smoker, Hypertension and Dyslipidemia.  Sonographer:    Juliene Rucks Referring Phys: [id number] ANASTASSIA DOUTOVA  Sonographer Comments: Image acquisition challenging due to COPD. IMPRESSIONS  1. Left ventricular ejection fraction, by estimation, is 60 to 65%. The left ventricle has normal function. The left ventricle has no regional wall motion abnormalities. Indeterminate diastolic filling due to E-A fusion.  2. Right ventricular systolic function is normal. The right ventricular size is normal. Tricuspid regurgitation signal is inadequate for assessing PA pressure.  3. Left atrial size was mildly dilated.  4. The mitral valve is grossly normal. Mild mitral valve regurgitation. No evidence of mitral stenosis.  5. The aortic valve was not well visualized. Aortic valve regurgitation is not visualized. No aortic stenosis is present.  6. The inferior vena cava is normal in size with greater than 50% respiratory variability, suggesting right atrial pressure of 3 mmHg. FINDINGS  Left Ventricle: Left ventricular ejection fraction, by estimation, is 60 to 65%. The left ventricle has normal function. The left ventricle has no regional wall motion abnormalities. The left ventricular internal cavity size was normal in size. There is  no left ventricular hypertrophy.  Indeterminate diastolic filling due to E-A fusion. Right Ventricle: The right ventricular size is normal. No increase in right ventricular wall thickness. Right ventricular systolic function is normal. Tricuspid regurgitation signal is inadequate for assessing PA pressure. Left Atrium: Left atrial size was mildly dilated. Right Atrium: Right atrial size was normal in size. Pericardium: Trivial pericardial effusion is present. Presence of epicardial fat layer. Mitral Valve: The mitral valve is grossly normal. Mild mitral annular calcification. Mild mitral valve regurgitation. No evidence of mitral valve stenosis. Tricuspid Valve: The tricuspid valve is grossly normal. Tricuspid valve regurgitation is trivial. No evidence of tricuspid stenosis. Aortic Valve: The aortic valve was not well visualized. Aortic valve regurgitation is not visualized. No aortic stenosis is present. Pulmonic Valve: The pulmonic valve was grossly normal. Pulmonic valve regurgitation is not visualized. No evidence of pulmonic stenosis. Aorta: The aortic root and ascending aorta are structurally normal, with no evidence of dilitation. Venous: The inferior vena cava is normal in size with greater than 50% respiratory variability, suggesting right atrial pressure of 3 mmHg. IAS/Shunts: The atrial septum is grossly normal.  LEFT VENTRICLE PLAX 2D LVIDd:         4.60 cm     Diastology LVIDs:         2.90 cm     LV e' medial:    8.05 cm/s LV PW:         1.10 cm     LV E/e' medial:  10.5 LV IVS:        1.10 cm     LV e' lateral:   6.42 cm/s LVOT diam:     2.00 cm     LV E/e' lateral: 13.2 LV SV:         82 LV SV Index:   47 LVOT Area:     3.14 cm  LV Volumes (MOD) LV vol d, MOD A2C: 81.9 ml LV vol d, MOD A4C: 74.3 ml LV vol s, MOD A2C: 47.2 ml LV vol s, MOD A4C: 37.1 ml LV SV MOD A2C:     34.7 ml LV SV MOD A4C:     74.3 ml LV SV MOD BP:  36.6 ml RIGHT VENTRICLE RV Basal diam:  3.40 cm RV Mid diam:    2.90 cm RV S prime:     12.10 cm/s TAPSE  (M-mode): 2.4 cm LEFT ATRIUM             Index        RIGHT ATRIUM           Index LA diam:        2.80 cm 1.61 cm/m   RA Area:     12.60 cm LA Vol (A2C):   68.8 ml 39.66 ml/m  RA Volume:   28.40 ml  16.37 ml/m LA Vol (A4C):   68.6 ml 39.55 ml/m LA Biplane Vol: 70.1 ml 40.41 ml/m  AORTIC VALVE LVOT Vmax:   150.00 cm/s LVOT Vmean:  104.000 cm/s LVOT VTI:    0.262 m  AORTA Ao Root diam: 3.10 cm Ao Asc diam:  2.90 cm MITRAL VALVE MV Area (PHT): 5.84 cm     SHUNTS MV Decel Time: 130 msec     Systemic VTI:  0.26 m MV E velocity: 84.80 cm/s   Systemic Diam: 2.00 cm MV A velocity: 128.00 cm/s MV E/A ratio:  0.66 Darryle Decent MD Electronically signed by Darryle Decent MD Signature Date/Time: 06/30/2024/11:20:48 AM    Final    CT Hip Right Wo Contrast Result Date: 06/29/2024 CLINICAL DATA:  Hip replacement, periprosthetic fracture suspected EXAM: CT OF THE RIGHT HIP WITHOUT CONTRAST TECHNIQUE: Multidetector CT imaging of the right hip was performed according to the standard protocol. Multiplanar CT image reconstructions were also generated. RADIATION DOSE REDUCTION: This exam was performed according to the departmental dose-optimization program which includes automated exposure control, adjustment of the mA and/or kV according to patient size and/or use of iterative reconstruction technique. COMPARISON:  Radiographs 06/29/2024 and 05/11/2021. FINDINGS: Bones/Joint/Cartilage Patient is status post bilateral total hip arthroplasty with associated mild beam hardening artifact. As demonstrated on the earlier radiographs, there is an acute periprosthetic fracture of the proximal right femur with up to 7 mm of medial displacement laterally. Fracture extends superiorly into the greater trochanter. No hardware loosening or displacement identified. There is no dislocation or fracture of the right hemipelvis. No significant hip joint effusion. There are mild sacroiliac degenerative changes bilaterally and multilevel lower  lumbar spondylosis. Ligaments Suboptimally assessed by CT. Muscles and Tendons No focal or acute muscular findings are demonstrated. Soft tissues Ill-defined subcutaneous hematoma posterolateral to the proximal right femur, measuring up to 6.1 cm on coronal image 35/7. No organized fluid collection or other significant hematoma demonstrated. Status post femoral bypass grafting bilaterally. Underlying iliofemoral atherosclerosis. Possible small dependent gallstones in the gallbladder lumen. Previous hysterectomy noted. IMPRESSION: 1. Acute periprosthetic fracture of the proximal right femur as described. No hardware loosening or displacement identified. 2. Ill-defined subcutaneous hematoma posterolaterally. 3. No evidence of dislocation or fracture of the right hemipelvis. 4. Possible cholelithiasis. Electronically Signed   By: Elsie Perone M.D.   On: 06/29/2024 21:38   DG Chest 1 View Result Date: 06/29/2024 CLINICAL DATA:  Preop EXAM: CHEST  1 VIEW COMPARISON:  06/08/2023 FINDINGS: Cardiomegaly. There is hyperinflation of the lungs compatible with COPD. Bibasilar opacities likely reflect scarring or atelectasis. No effusions. No acute bony abnormality. IMPRESSION: Cardiomegaly, COPD. Bibasilar atelectasis or scarring. No active disease. Electronically Signed   By: Franky Crease M.D.   On: 06/29/2024 21:17   DG Elbow Complete Right Result Date: 06/29/2024 EXAM: 3 VIEW(S) XRAY OF THE _LATERALITY_ ELBOW COMPARISON: None available.  CLINICAL HISTORY: injury. Reason for exam: injury; Triage notes: Patient coming from home after a mechanical fall this afternoon. EMS reprots that she has bilateral bruising on knees, bilateral skin tears on arms, bilateral hip replacements and right hip and leg pain and the right leg is shorter. ; Patient denies LOC, did not hit head, denies back and neck pain. Recently stopped taking blood thinners. Chronically on 2L of oxygen . FINDINGS: BONES AND JOINTS: No acute fracture. No  focal osseous lesion. No joint dislocation. No joint effusion. SOFT TISSUES: The soft tissues are unremarkable. IMPRESSION: 1. No acute abnormality. Electronically signed by: Norman Gatlin MD 06/29/2024 07:16 PM EDT RP Workstation: [id number]   DG Forearm Left Result Date: 06/29/2024 EXAM: VIEW(S) XRAY OF THE LEFT FOREARM 06/29/2024 07:00:00 PM COMPARISON: None available. CLINICAL HISTORY: Injury. Reason for exam: injury; Triage notes: Patient coming from home after a mechanical fall this afternoon. EMS reports that she has bilateral bruising on knees, bilateral skin tears on arms, bilateral hip replacements and right hip and leg pain and the right leg is shorter. FINDINGS: BONES AND JOINTS: Plate and screw fixation of distal radius in place. Chronic ulnar styloid process fracture. Chronic deformity of distal ulna. Advanced first CMC and triscaphe degenerative change. No joint dislocation. SOFT TISSUES: Soft tissue swelling of distal forearm. IMPRESSION: 1. No acute fracture or dislocation. Electronically signed by: Norman Gatlin MD 06/29/2024 07:16 PM EDT RP Workstation: [id number]   DG Lumbar Spine 2-3 Views Result Date: 06/29/2024 EXAM: 2 or 3 VIEW(S) XRAY OF THE LUMBAR SPINE 06/29/2024 07:00:00 PM COMPARISON: None available. CLINICAL HISTORY: [id number] Injury [id number]. Reason for exam: injury; Triage notes: Patient coming from home after a mechanical fall this afternoon. EMS reprots that she has bilateral bruising on knees, bilateral skin tears on arms, bilateral hip replacements and right hip and leg pain and the right leg is shorter. Patient denies LOC, did not hit head, denies back and neck pain. Recently stopped taking blood thinners. Chronically on 2L of oxygen . FINDINGS: LUMBAR SPINE: BONES: Demineralization and degenerative changes limiting assessment for subtle fractures. No definite acute fracture or evidence of traumatic malalignment. No aggressive appearing osseous lesion. DISCS AND DEGENERATIVE  CHANGES: Degenerative changes are present. No severe degenerative changes. SOFT TISSUES: No acute abnormality. IMPRESSION: 1. No evidence of acute fracture or traumatic malalignment. Electronically signed by: Norman Gatlin MD 06/29/2024 07:14 PM EDT RP Workstation: [id number]   DG Knee 1-2 Views Right Result Date: 06/29/2024 EXAM: 1 or 2 VIEW(S) XRAY OF THE KNEE 06/29/2024 07:00:00 PM COMPARISON: None available. CLINICAL HISTORY: Patient coming from home after a mechanical fall this afternoon. EMS reports that she has bilateral bruising on knees, bilateral skin tears on arms, bilateral hip replacements and right hip and leg pain and the right leg is shorter. Patient denies LOC, did not hit head, denies back and neck pain. Recently stopped taking blood thinners. Chronically on 2L of oxygen . FINDINGS: BONES AND JOINTS: No acute fracture. No focal osseous lesion. No joint dislocation. No significant joint effusion. Mild tricompartmental degenerative changes. SOFT TISSUES: Severe atherosclerotic vascular calcifications. IMPRESSION: 1. No acute fracture or dislocation. Electronically signed by: Norman Gatlin MD 06/29/2024 07:12 PM EDT RP Workstation: [id number]   DG Hip Unilat W or Wo Pelvis 2-3 Views Right Result Date: 06/29/2024 EXAM: 2 or more VIEW(S) XRAY OF THE BILATERAL HIP 06/29/2024 07:00:00 PM COMPARISON: None available. CLINICAL HISTORY: Injury. Reason for exam: injury. Triage notes: Patient coming from home after a mechanical fall this afternoon. EMS reports that she  has bilateral bruising on knees, bilateral skin tears on arms, bilateral hip replacements and right hip and leg pain and the right leg is shorter. Patient denies LOC, did not hit head, denies back and neck pain. Recently stopped taking blood thinners. Chronically on 2L of oxygen . FINDINGS: BONES AND JOINTS: Bilateral hip arthroplasties are in place. There is a nondisplaced right proximal femoral shaft fracture just inferior to the  greater trochanter. SOFT TISSUES: Iliac and left femoral stents are in place. Vascular calcifications are present. IMPRESSION: 1. Nondisplaced periprosthetic right proximal femoral shaft fracture. Electronically signed by: Norman Gatlin MD 06/29/2024 07:11 PM EDT RP Workstation: [id number]    Scheduled Meds:  atorvastatin   80 mg Oral Daily   ezetimibe   10 mg Oral Daily   pantoprazole   40 mg Oral BID   verapamil   240 mg Oral Daily    Continuous Infusions:   LOS: 1 day     Karalyn Kadel J Lashay Osborne, MD [facility]  If 7PM-7AM, please contact night-coverage [email or web address] 06/30/2024, 12:50 PM

## 2024-06-30 NOTE — Progress Notes (Signed)
 Echocardiogram 2D Echocardiogram has been performed.  Marie Johnson 06/30/2024, 9:35 AM

## 2024-06-30 NOTE — Plan of Care (Signed)
  Problem: Activity: Goal: Risk for activity intolerance will decrease Outcome: Progressing   Problem: Nutrition: Goal: Adequate nutrition will be maintained Outcome: Progressing   Problem: Coping: Goal: Level of anxiety will decrease Outcome: Progressing   Problem: Pain Managment: Goal: General experience of comfort will improve and/or be controlled Outcome: Progressing   Problem: Skin Integrity: Goal: Risk for impaired skin integrity will decrease Outcome: Progressing

## 2024-06-30 NOTE — Progress Notes (Signed)
 Initial Nutrition Assessment  DOCUMENTATION CODES:   Non-severe (moderate) malnutrition in context of chronic illness  INTERVENTION:  Diet Heart, consider liberalizing diet if intake remains poor Order Ensure Plus High Protein po TID, each supplement provides 350 kcal and 20 grams of protein. MVI w/minerals daily, continue B12  NUTRITION DIAGNOSIS:   Moderate Malnutrition related to chronic illness as evidenced by energy intake < 75% for > or equal to 1 month, mild fat depletion, moderate muscle depletion.   GOAL:   Patient will meet greater than or equal to 90% of their needs   MONITOR:   PO intake, Supplement acceptance, Weight trends  REASON FOR ASSESSMENT:   Consult Assessment of nutrition requirement/status (Hip/Femur fracture patient)  ASSESSMENT:   medical history significant of COPD on chronic 2 L  CAD, PAD, HTN ,HLD, bilateral hip replacements presented with mechanical fall after she tripped on her oxygen  tubing resulting in right hip pain.  Patient seen in room, attempting to eat breakfast. Patient reports whenever she tries to eat solid food she starts coughing which increases her pain so she has not been abel to eat today. Liquids have been better tolerated. Coughing is a chronic issues but was not causing her pain until after she fell. Report not a big appetite at baseline, will usually eat one or two sandwiches a day with a side of fruit. Weight has been stable in chart hx, pt reports low weight of 119 lbs last year before christmas but has been able to slowly put on weight. Pt with mild fat and moderate muscle wasting on exam, meets ASPEN criteria for malnutrition. Pt agreed to vanilla ensure with each meal to supplement intake while eating solids is difficult. Pt requesting pain meds, RN notified.   Labs: Cl 96, Glu 1178  Meds: B12, protonix    NUTRITION - FOCUSED PHYSICAL EXAM:  Flowsheet Row Most Recent Value  Orbital Region Mild depletion  Upper Arm  Region Moderate depletion  Thoracic and Lumbar Region Unable to assess  [pt with back pain]  Buccal Region Mild depletion  Temple Region Moderate depletion  Clavicle Bone Region Moderate depletion  Clavicle and Acromion Bone Region Moderate depletion  Scapular Bone Region Unable to assess  [pt with back pain]  Dorsal Hand Moderate depletion  Patellar Region Moderate depletion  Anterior Thigh Region Moderate depletion  Posterior Calf Region Mild depletion  Hair Reviewed  Eyes Reviewed  Mouth Reviewed  Skin Reviewed  Nails Reviewed    Diet Order:   Diet Order             Diet Heart Room service appropriate? Yes; Fluid consistency: Thin  Diet effective now                   EDUCATION NEEDS:   Education needs have been addressed  Skin:  Skin Assessment: Reviewed RN Assessment  Last BM:  PTA  Height:   Ht Readings from Last 1 Encounters:  05/05/24 5' 7.5 (1.715 m)    Weight:   Wt Readings from Last 1 Encounters:  06/30/24 61.2 kg    BMI:  Body mass index is 20.82 kg/m.  Estimated Nutritional Needs:   Kcal:  1530-1836 kcals  Protein:  61-91 grams  Fluid:  1.5-1.8L/d  Madalyn Potters, MS, RD, LDN Clinical Dietitian  Contact via secure chat. If unavailable, use group chat RD Inpatient.

## 2024-06-30 NOTE — TOC CAGE-AID Note (Signed)
 Transition of Care [facility]) - CAGE-AID Screening   Patient Details  Name: Marie Johnson MRN: [id number] Date of Birth: 1950/12/13  Transition of Care [facility]) CM/SW Contact:    Ashlynd Michna E Alani Sabbagh, LCSW Phone Number: 06/30/2024, 10:57 AM   Clinical Narrative: No SA noted.   CAGE-AID Screening:    Have You Ever Felt You Ought to Cut Down on Your Drinking or Drug Use?: No Have People Annoyed You By Critizing Your Drinking Or Drug Use?: No Have You Felt Bad Or Guilty About Your Drinking Or Drug Use?: No Have You Ever Had a Drink or Used Drugs First Thing In The Morning to Steady Your Nerves or to Get Rid of a Hangover?: No CAGE-AID Score: 0  Substance Abuse Education Offered: No

## 2024-06-30 NOTE — Consult Note (Signed)
 Reason for Consult:Right hip fx Referring Physician: Lebron Cage Time called: 1323 Time at bedside: [address] Marie Johnson is an 73 y.o. female.  HPI: Marie Johnson tripped over her O2 tubing and fell yesterday. She had immediate right hip pain and could not get up. She was brought to the ED where x-rays showed a periprosthetic hip fx and orthopedic surgery was consulted.   Past Medical History:  Diagnosis Date   AAA (abdominal aortic aneurysm)    3.4 cm   Anemia    Anxiety    Aortic atherosclerosis    Arthritis    Asthma    CAD (coronary artery disease)    a. Nonobstructive CAD by cath 07/17/13.   Cancer [facility])    Carotid artery disease    Carotid US  8/22: Bilateral ICA 1-39; right subclavian stenosis; right thyroid  nodule   CHD (congenital heart disease)    pt unaware???   Chest pain    a. Adm 10-07/2013 - CTA neg for PE/dissection, cath with nonobstructive disease, CP ?due to uncontrolled HTN vs coronary vasospasm.   CHF (congestive heart failure) (HCC)    Closed head injury with concussion    COPD (chronic obstructive pulmonary disease) (HCC)    Coronary artery disease    Echocardiogram    Echocardiogram 06/2019: EF 60-65, impaired relaxation (Gr 1 DD), mild MAC, mild MR, mild to mod aortic valve sclerosis (no AS), trivial TR, mild LAE, normal RVSF, RVSP 30.7 (mildly elevated)   GERD (gastroesophageal reflux disease)    History of blood transfusion    childbirth, Hysterectomy   Hyperlipidemia    Hypertension    Neuropathy    OA (osteoarthritis)    Osteoporosis    PAD (peripheral artery disease)    a. s/p L fem-pop bypass b. recent evaluation at [facility], unamenable to intervention   PAD (peripheral artery disease)    Palpitations 02/22/2016   Pneumonia    Pre-diabetes    Premature ventricular contraction    PUD (peptic ulcer disease)    PVD (peripheral vascular disease) 07/17/2013   Thoracic aortic aneurysm    Chest/aorta CTA 04/2022: 14 x 8 focal saccular aneurysm  versus penetrating atherosclerotic ulcer involving left side of proximal portion of transverse aortic arch; descending thoracic aorta and proximal abdominal aorta aneurysm 4.4 cm; aortic atherosclerosis; emphysema   Tobacco abuse    Wears dentures    Wears glasses     Past Surgical History:  Procedure Laterality Date   ABDOMINAL AORTOGRAM W/LOWER EXTREMITY N/A 03/09/2020   Procedure: ABDOMINAL AORTOGRAM W/LOWER EXTREMITY;  Surgeon: Gretta Lonni PARAS, MD;  [facility]: [facility] INVASIVE CV LAB;  Service: Cardiovascular;  Laterality: N/A;   ABDOMINAL AORTOGRAM W/LOWER EXTREMITY N/A 07/26/2022   Procedure: ABDOMINAL AORTOGRAM W/LOWER EXTREMITY;  Surgeon: Gretta Lonni PARAS, MD;  Location: [facility];  Service: Cardiovascular;  Laterality: N/A;   ABDOMINAL AORTOGRAM W/LOWER EXTREMITY N/A 09/20/2022   Procedure: ABDOMINAL AORTOGRAM W/LOWER EXTREMITY;  Surgeon: Gretta Lonni PARAS, MD;  Location: [facility];  Service: Cardiovascular;  Laterality: N/A;   ABDOMINAL HYSTERECTOMY     BREAST SURGERY     left cyst excision x 2   CARDIAC CATHETERIZATION     several years ago, nonobstructive, 50-60% blockages   DIRECT LARYNGOSCOPY Bilateral 03/13/2023   Procedure: MICRO DIRECT LARYNGOSCOPY WITH BIOPSY;  Surgeon: Carlie Clark, MD;  Location: [facility] OR;  Service: ENT;  Laterality: Bilateral;   ENDARTERECTOMY FEMORAL Right 03/18/2020   Procedure: ENDARTERECTOMY FEMORAL WITH PROFUNDAPLASTY;  Surgeon: Gretta Lonni  J, MD;  Location: [facility] OR;  Service: Vascular;  Laterality: Right;   FEMORAL-POPLITEAL BYPASS GRAFT Right 03/18/2020   Procedure: BYPASS GRAFT FEMORAL-POPLITEAL ARTERY using GORE PROPATEN VASCULAR GRAFT;  Surgeon: Gretta Lonni PARAS, MD;  Location: [facility] OR;  Service: Vascular;  Laterality: Right;   HIP SURGERY     left - bars and screws placed   HIP SURGERY     INSERTION OF ILIAC STENT Right 03/18/2020   Procedure: INSERTION OF GORE VIABAHN  ILIAC STENT;  Surgeon: Gretta Lonni PARAS, MD;   Location: [facility] OR;  Service: Vascular;  Laterality: Right;   LEFT HEART CATH AND CORONARY ANGIOGRAPHY N/A 04/08/2023   Procedure: LEFT HEART CATH AND CORONARY ANGIOGRAPHY;  Surgeon: Anner Alm ORN, MD;  Location: [facility] INVASIVE CV LAB;  Service: Cardiovascular;  Laterality: N/A;   LEFT HEART CATHETERIZATION WITH CORONARY ANGIOGRAM N/A 07/17/2013   Procedure: LEFT HEART CATHETERIZATION WITH CORONARY ANGIOGRAM;  Surgeon: Ezra GORMAN Shuck, MD;  Location: [facility] CATH LAB;  Service: Cardiovascular;  Laterality: N/A;   LOWER EXTREMITY ANGIOGRAM Right 03/18/2020   Procedure: RIGHT ILIAC ARTERIOGRAM WITH AN ILIAC STENT;  Surgeon: Gretta Lonni PARAS, MD;  Location: [facility] OR;  Service: Vascular;  Laterality: Right;   MULTIPLE TOOTH EXTRACTIONS     PATCH ANGIOPLASTY Right 03/18/2020   Procedure: PATCH ANGIOPLASTY using a GEORGE BIOLOGIC PATCH OF THE PROFUNDA;  Surgeon: Gretta Lonni PARAS, MD;  Location: [facility] OR;  Service: Vascular;  Laterality: Right;   PERIPHERAL VASCULAR CATHETERIZATION N/A 07/16/2016   Procedure: Abdominal Aortogram w/Lower Extremity;  Surgeon: Krystal JULIANNA Doing, MD;  Location: [facility] INVASIVE CV LAB;  Service: Cardiovascular;  Laterality: N/A;   PERIPHERAL VASCULAR INTERVENTION Left 03/09/2020   Procedure: PERIPHERAL VASCULAR INTERVENTION;  Surgeon: Gretta Lonni PARAS, MD;  [facility];  Service: Cardiovascular;  Laterality: Left;  common and external illiac    ROTATOR CUFF REPAIR     right side, had torn ligaments as well   TUBAL LIGATION     VEIN BYPASS SURGERY     left leg   WRIST ARTHROCENTESIS     left - broke in 3 places so has bars and screws placed    Family History  Problem Relation Age of Onset   Cervical cancer Mother    Heart disease Mother        MI in 29s   Liver cancer Father    Heart disease Father    Lung cancer Maternal Aunt    Prostate cancer Maternal Uncle        met. anal   Breast cancer Maternal Grandmother    Lung cancer Maternal Aunt    Lung cancer  Maternal Aunt    Stomach cancer Maternal Aunt    Colon cancer Maternal Aunt    Lung cancer Maternal Uncle     Social History:  reports that she has quit smoking. Her smoking use included cigarettes. She has a 28.5 pack-year smoking history. She has been exposed to tobacco smoke. She has never used smokeless tobacco. She reports that she does not drink alcohol and does not use drugs.  Allergies:  Allergies  Allergen Reactions   Bee Venom Anaphylaxis and Other (See Comments)    Welts, also    Insect bites- Welts, also   Insect Extract Anaphylaxis and Other (See Comments)    Insect bites- Welts, also   Farxiga  [Dapagliflozin ] Nausea And Vomiting   Lyrica [Pregabalin] Nausea And Vomiting   Neurontin [Gabapentin] Nausea And Vomiting and Other (See Comments)  Hypertension/dizziness   Nsaids Nausea Only and Other (See Comments)    Patient is not suppose to have this class of medication (liver)  Can tolerate 81mg , which she is taking   Latex Rash and Other (See Comments)    Medications: I have reviewed the patient's current medications.  Results for orders placed or performed during the hospital encounter of 06/29/24 (from the past 48 hours)  CBC with Differential     Status: Abnormal   Collection Time: 06/29/24  7:10 PM  Result Value Ref Range   WBC 10.6 (H) 4.0 - 10.5 K/uL   RBC 2.87 (L) 3.87 - 5.11 MIL/uL   Hemoglobin 9.3 (L) 12.0 - 15.0 g/dL   HCT 70.6 (L) 63.9 - 53.9 %   MCV 102.1 (H) 80.0 - 100.0 fL   MCH 32.4 26.0 - 34.0 pg   MCHC 31.7 30.0 - 36.0 g/dL   RDW 76.0 (H) 88.4 - 84.4 %   Platelets 304 150 - 400 K/uL   nRBC 0.8 (H) 0.0 - 0.2 %   Neutrophils Relative % 93 %   Neutro Abs 9.9 (H) 1.7 - 7.7 K/uL   Lymphocytes Relative 4 %   Lymphs Abs 0.4 (L) 0.7 - 4.0 K/uL   Monocytes Relative 3 %   Monocytes Absolute 0.3 0.1 - 1.0 K/uL   Eosinophils Relative 0 %   Eosinophils Absolute 0.0 0.0 - 0.5 K/uL   Basophils Relative 0 %   Basophils Absolute 0.0 0.0 - 0.1 K/uL    WBC Morphology See Note     Comment:  Mild Left Shift. 1 to 5% Metas, occ myelo  Increased Bands. >20% Bands   Smear Review See Note     Comment:  Normal Platelet Morphology   Schistocytes PRESENT    Polychromasia PRESENT    Ovalocytes PRESENT     Comment: Performed at [facility], [address] [place], [place] [address]  Basic metabolic panel     Status: Abnormal   Collection Time: 06/29/24  7:10 PM  Result Value Ref Range   Sodium 138 135 - 145 mmol/L   Potassium 3.6 3.5 - 5.1 mmol/L   Chloride 99 98 - 111 mmol/L   CO2 29 22 - 32 mmol/L   Glucose, Bld 103 (H) 70 - 99 mg/dL    Comment: Glucose reference range applies only to samples taken after fasting for at least 8 hours.   BUN 15 8 - 23 mg/dL   Creatinine, Ser 9.14 0.44 - 1.00 mg/dL   Calcium  9.3 8.9 - 10.3 mg/dL   GFR, Estimated >39 >39 mL/min    Comment: (NOTE) Calculated using the CKD-EPI Creatinine Equation (2021)    Anion gap 10 5 - 15    Comment: Performed at [facility], [address], [place], [place] [address]  Type and screen     Status: None   Collection Time: 06/29/24 10:38 PM  Result Value Ref Range   ABO/RH(D) A POS    Antibody Screen NEG    Sample Expiration      07/02/2024,2359 Performed at [facility], [address] [place], [place] [address]   Vitamin B12     Status: None   Collection Time: 06/29/24 10:43 PM  Result Value Ref Range   Vitamin B-12 371 180 - 914 pg/mL    Comment: (NOTE) This assay is not validated for testing neonatal or myeloproliferative syndrome specimens for Vitamin B12 levels. Performed at [facility],  [address] [place], [place] [address]   Folate     Status: None   Collection Time: 06/29/24 10:43 PM  Result Value Ref Range   Folate 16.9 >5.9 ng/mL    Comment: Performed at [facility], [address] [place], [place] [address]  Iron and TIBC     Status: None   Collection Time: 06/29/24 10:43 PM  Result Value Ref Range   Iron  61 28 - 170 ug/dL   TIBC 671 749 - 549 ug/dL   Saturation Ratios 19 10.4 - 31.8 %   UIBC 267 ug/dL    Comment: Performed at [facility], [address], [place], [place] [address]  Ferritin     Status: Abnormal   Collection Time: 06/29/24 10:43 PM  Result Value Ref Range   Ferritin 475 (H) 11 - 307 ng/mL    Comment: Performed at [facility], [address], [place], [place] [address]  Reticulocytes     Status: Abnormal   Collection Time: 06/29/24 10:43 PM  Result Value Ref Range   Retic Ct Pct 1.2 0.4 - 3.1 %   RBC. 3.10 (L) 3.87 - 5.11 MIL/uL   Retic Count, Absolute 38.4 19.0 - 186.0 K/uL   Immature Retic Fract 12.9 2.3 - 15.9 %    Comment: Performed at [facility], [address], [place], [place] [address]  CK     Status: None   Collection Time: 06/29/24 10:43 PM  Result Value Ref Range   Total CK 114 38 - 234 U/L    Comment: Performed at [facility], [address] [place], [place] [address]  Magnesium      Status: Abnormal   Collection Time: 06/29/24 10:43 PM  Result Value Ref Range   Magnesium  1.6 (L) 1.7 - 2.4 mg/dL    Comment: Performed at [facility], [address] [place], [place] [address]  Phosphorus     Status: None   Collection Time: 06/29/24 10:43 PM  Result Value Ref Range   Phosphorus 2.7 2.5 - 4.6 mg/dL    Comment: Performed at [facility], [address], [place], [place] [address]  CBC     Status: Abnormal   Collection Time: 06/30/24  4:39 AM  Result Value Ref Range   WBC 6.1 4.0 - 10.5 K/uL   RBC 2.73 (L) 3.87 - 5.11 MIL/uL   Hemoglobin 8.8 (L) 12.0 - 15.0 g/dL   HCT 72.6 (L) 63.9 - 53.9 %   MCV 100.0 80.0 - 100.0 fL   MCH 32.2 26.0 - 34.0 pg   MCHC 32.2 30.0 - 36.0 g/dL   RDW 76.2 (H) 88.4 - 84.4 %   Platelets 300 150 - 400 K/uL   nRBC 1.8 (H) 0.0 - 0.2 %    Comment: Performed at [facility], [address] [place], [place] [address]  Comprehensive metabolic panel     Status: Abnormal    Collection Time: 06/30/24  4:39 AM  Result Value Ref Range   Sodium 136 135 - 145 mmol/L   Potassium 3.6 3.5 - 5.1 mmol/L   Chloride 96 (L) 98 - 111 mmol/L   CO2 26 22 - 32 mmol/L   Glucose, Bld 118 (H) 70 - 99 mg/dL    Comment: Glucose reference range applies only to samples taken after fasting for at least 8 hours.   BUN 16 8 - 23 mg/dL   Creatinine,  Ser 0.85 0.44 - 1.00 mg/dL   Calcium  8.9 8.9 - 10.3 mg/dL   Total Protein 6.7 6.5 - 8.1 g/dL   Albumin  3.5 3.5 - 5.0 g/dL   AST 17 15 - 41 U/L   ALT 11 0 - 44 U/L   Alkaline Phosphatase 70 38 - 126 U/L   Total Bilirubin 1.6 (H) 0.0 - 1.2 mg/dL   GFR, Estimated >39 >39 mL/min    Comment: (NOTE) Calculated using the CKD-EPI Creatinine Equation (2021)    Anion gap 14 5 - 15    Comment: Performed at [facility], [address] [place], [place] [address]  VITAMIN D  25 Hydroxy (Vit-D Deficiency, Fractures)     Status: None   Collection Time: 06/30/24  4:39 AM  Result Value Ref Range   Vit D, 25-Hydroxy 32.80 30 - 100 ng/mL    Comment: (NOTE) Vitamin D  deficiency has been defined by the Institute of Medicine  and an Endocrine Society practice guideline as a level of serum 25-OH  vitamin D  less than 20 ng/mL (1,2). The Endocrine Society went on to  further define vitamin D  insufficiency as a level between 21 and 29  ng/mL (2).  1. IOM (Institute of Medicine). 2010. Dietary reference intakes for  calcium  and D. [place]: [employer]. 2. Holick MF, Binkley Ferguson, Bischoff-Ferrari HA, et al. Evaluation,  treatment, and prevention of vitamin D  deficiency: an Endocrine  Society clinical practice guideline, JCEM. 2011 Jul; 96(7): 1911-30.  Performed at [facility], [address] [place], [place] [address]     ECHOCARDIOGRAM COMPLETE Result Date: 06/30/2024    ECHOCARDIOGRAM REPORT   Patient Name:   BRYLYN NOVAKOVICH Date of Exam: 06/30/2024 Medical Rec #:  [id number]      Height:       67.5 in Accession  #:    [id number]     Weight:       137.6 lb Date of Birth:  May 18, 1951      BSA:          1.735 m Patient Age:    73 years       BP:           146/70 mmHg Patient Gender: F              HR:           95 bpm. Exam Location:  Inpatient Procedure: 2D Echo, Cardiac Doppler and Color Doppler (Both Spectral and Color            Flow Doppler were utilized during procedure). Indications:    Cardiomegaly, preoperative evaluation  History:        Patient has prior history of Echocardiogram examinations, most                 recent 03/22/2016. CHF, CAD, COPD, Signs/Symptoms:Chest Pain; Risk                 Factors:Current Smoker, Hypertension and Dyslipidemia.  Sonographer:    Juliene Rucks Referring Phys: [id number] ANASTASSIA DOUTOVA  Sonographer Comments: Image acquisition challenging due to COPD. IMPRESSIONS  1. Left ventricular ejection fraction, by estimation, is 60 to 65%. The left ventricle has normal function. The left ventricle has no regional wall motion abnormalities. Indeterminate diastolic filling due to E-A fusion.  2. Right ventricular systolic function is normal. The right ventricular size is normal. Tricuspid regurgitation signal is inadequate for assessing PA pressure.  3. Left atrial  size was mildly dilated.  4. The mitral valve is grossly normal. Mild mitral valve regurgitation. No evidence of mitral stenosis.  5. The aortic valve was not well visualized. Aortic valve regurgitation is not visualized. No aortic stenosis is present.  6. The inferior vena cava is normal in size with greater than 50% respiratory variability, suggesting right atrial pressure of 3 mmHg. FINDINGS  Left Ventricle: Left ventricular ejection fraction, by estimation, is 60 to 65%. The left ventricle has normal function. The left ventricle has no regional wall motion abnormalities. The left ventricular internal cavity size was normal in size. There is  no left ventricular hypertrophy. Indeterminate diastolic filling due to E-A fusion. Right  Ventricle: The right ventricular size is normal. No increase in right ventricular wall thickness. Right ventricular systolic function is normal. Tricuspid regurgitation signal is inadequate for assessing PA pressure. Left Atrium: Left atrial size was mildly dilated. Right Atrium: Right atrial size was normal in size. Pericardium: Trivial pericardial effusion is present. Presence of epicardial fat layer. Mitral Valve: The mitral valve is grossly normal. Mild mitral annular calcification. Mild mitral valve regurgitation. No evidence of mitral valve stenosis. Tricuspid Valve: The tricuspid valve is grossly normal. Tricuspid valve regurgitation is trivial. No evidence of tricuspid stenosis. Aortic Valve: The aortic valve was not well visualized. Aortic valve regurgitation is not visualized. No aortic stenosis is present. Pulmonic Valve: The pulmonic valve was grossly normal. Pulmonic valve regurgitation is not visualized. No evidence of pulmonic stenosis. Aorta: The aortic root and ascending aorta are structurally normal, with no evidence of dilitation. Venous: The inferior vena cava is normal in size with greater than 50% respiratory variability, suggesting right atrial pressure of 3 mmHg. IAS/Shunts: The atrial septum is grossly normal.  LEFT VENTRICLE PLAX 2D LVIDd:         4.60 cm     Diastology LVIDs:         2.90 cm     LV e' medial:    8.05 cm/s LV PW:         1.10 cm     LV E/e' medial:  10.5 LV IVS:        1.10 cm     LV e' lateral:   6.42 cm/s LVOT diam:     2.00 cm     LV E/e' lateral: 13.2 LV SV:         82 LV SV Index:   47 LVOT Area:     3.14 cm  LV Volumes (MOD) LV vol d, MOD A2C: 81.9 ml LV vol d, MOD A4C: 74.3 ml LV vol s, MOD A2C: 47.2 ml LV vol s, MOD A4C: 37.1 ml LV SV MOD A2C:     34.7 ml LV SV MOD A4C:     74.3 ml LV SV MOD BP:      36.6 ml RIGHT VENTRICLE RV Basal diam:  3.40 cm RV Mid diam:    2.90 cm RV S prime:     12.10 cm/s TAPSE (M-mode): 2.4 cm LEFT ATRIUM             Index        RIGHT  ATRIUM           Index LA diam:        2.80 cm 1.61 cm/m   RA Area:     12.60 cm LA Vol (A2C):   68.8 ml 39.66 ml/m  RA Volume:   28.40 ml  16.37 ml/m LA Vol (A4C):  68.6 ml 39.55 ml/m LA Biplane Vol: 70.1 ml 40.41 ml/m  AORTIC VALVE LVOT Vmax:   150.00 cm/s LVOT Vmean:  104.000 cm/s LVOT VTI:    0.262 m  AORTA Ao Root diam: 3.10 cm Ao Asc diam:  2.90 cm MITRAL VALVE MV Area (PHT): 5.84 cm     SHUNTS MV Decel Time: 130 msec     Systemic VTI:  0.26 m MV E velocity: 84.80 cm/s   Systemic Diam: 2.00 cm MV A velocity: 128.00 cm/s MV E/A ratio:  0.66 Darryle Decent MD Electronically signed by Darryle Decent MD Signature Date/Time: 06/30/2024/11:20:48 AM    Final    CT Hip Right Wo Contrast Result Date: 06/29/2024 CLINICAL DATA:  Hip replacement, periprosthetic fracture suspected EXAM: CT OF THE RIGHT HIP WITHOUT CONTRAST TECHNIQUE: Multidetector CT imaging of the right hip was performed according to the standard protocol. Multiplanar CT image reconstructions were also generated. RADIATION DOSE REDUCTION: This exam was performed according to the departmental dose-optimization program which includes automated exposure control, adjustment of the mA and/or kV according to patient size and/or use of iterative reconstruction technique. COMPARISON:  Radiographs 06/29/2024 and 05/11/2021. FINDINGS: Bones/Joint/Cartilage Patient is status post bilateral total hip arthroplasty with associated mild beam hardening artifact. As demonstrated on the earlier radiographs, there is an acute periprosthetic fracture of the proximal right femur with up to 7 mm of medial displacement laterally. Fracture extends superiorly into the greater trochanter. No hardware loosening or displacement identified. There is no dislocation or fracture of the right hemipelvis. No significant hip joint effusion. There are mild sacroiliac degenerative changes bilaterally and multilevel lower lumbar spondylosis. Ligaments Suboptimally assessed by CT.  Muscles and Tendons No focal or acute muscular findings are demonstrated. Soft tissues Ill-defined subcutaneous hematoma posterolateral to the proximal right femur, measuring up to 6.1 cm on coronal image 35/7. No organized fluid collection or other significant hematoma demonstrated. Status post femoral bypass grafting bilaterally. Underlying iliofemoral atherosclerosis. Possible small dependent gallstones in the gallbladder lumen. Previous hysterectomy noted. IMPRESSION: 1. Acute periprosthetic fracture of the proximal right femur as described. No hardware loosening or displacement identified. 2. Ill-defined subcutaneous hematoma posterolaterally. 3. No evidence of dislocation or fracture of the right hemipelvis. 4. Possible cholelithiasis. Electronically Signed   By: Elsie Perone M.D.   On: 06/29/2024 21:38   DG Chest 1 View Result Date: 06/29/2024 CLINICAL DATA:  Preop EXAM: CHEST  1 VIEW COMPARISON:  06/08/2023 FINDINGS: Cardiomegaly. There is hyperinflation of the lungs compatible with COPD. Bibasilar opacities likely reflect scarring or atelectasis. No effusions. No acute bony abnormality. IMPRESSION: Cardiomegaly, COPD. Bibasilar atelectasis or scarring. No active disease. Electronically Signed   By: Franky Crease M.D.   On: 06/29/2024 21:17   DG Elbow Complete Right Result Date: 06/29/2024 EXAM: 3 VIEW(S) XRAY OF THE _LATERALITY_ ELBOW COMPARISON: None available. CLINICAL HISTORY: injury. Reason for exam: injury; Triage notes: Patient coming from home after a mechanical fall this afternoon. EMS reprots that she has bilateral bruising on knees, bilateral skin tears on arms, bilateral hip replacements and right hip and leg pain and the right leg is shorter. ; Patient denies LOC, did not hit head, denies back and neck pain. Recently stopped taking blood thinners. Chronically on 2L of oxygen . FINDINGS: BONES AND JOINTS: No acute fracture. No focal osseous lesion. No joint dislocation. No joint  effusion. SOFT TISSUES: The soft tissues are unremarkable. IMPRESSION: 1. No acute abnormality. Electronically signed by: Norman Gatlin MD 06/29/2024 07:16 PM EDT RP Workstation: [id number]  DG Forearm Left Result Date: 06/29/2024 EXAM: VIEW(S) XRAY OF THE LEFT FOREARM 06/29/2024 07:00:00 PM COMPARISON: None available. CLINICAL HISTORY: Injury. Reason for exam: injury; Triage notes: Patient coming from home after a mechanical fall this afternoon. EMS reports that she has bilateral bruising on knees, bilateral skin tears on arms, bilateral hip replacements and right hip and leg pain and the right leg is shorter. FINDINGS: BONES AND JOINTS: Plate and screw fixation of distal radius in place. Chronic ulnar styloid process fracture. Chronic deformity of distal ulna. Advanced first CMC and triscaphe degenerative change. No joint dislocation. SOFT TISSUES: Soft tissue swelling of distal forearm. IMPRESSION: 1. No acute fracture or dislocation. Electronically signed by: Norman Gatlin MD 06/29/2024 07:16 PM EDT RP Workstation: [id number]   DG Lumbar Spine 2-3 Views Result Date: 06/29/2024 EXAM: 2 or 3 VIEW(S) XRAY OF THE LUMBAR SPINE 06/29/2024 07:00:00 PM COMPARISON: None available. CLINICAL HISTORY: [id number] Injury [id number]. Reason for exam: injury; Triage notes: Patient coming from home after a mechanical fall this afternoon. EMS reprots that she has bilateral bruising on knees, bilateral skin tears on arms, bilateral hip replacements and right hip and leg pain and the right leg is shorter. Patient denies LOC, did not hit head, denies back and neck pain. Recently stopped taking blood thinners. Chronically on 2L of oxygen . FINDINGS: LUMBAR SPINE: BONES: Demineralization and degenerative changes limiting assessment for subtle fractures. No definite acute fracture or evidence of traumatic malalignment. No aggressive appearing osseous lesion. DISCS AND DEGENERATIVE CHANGES: Degenerative changes are present. No severe  degenerative changes. SOFT TISSUES: No acute abnormality. IMPRESSION: 1. No evidence of acute fracture or traumatic malalignment. Electronically signed by: Norman Gatlin MD 06/29/2024 07:14 PM EDT RP Workstation: [id number]   DG Knee 1-2 Views Right Result Date: 06/29/2024 EXAM: 1 or 2 VIEW(S) XRAY OF THE KNEE 06/29/2024 07:00:00 PM COMPARISON: None available. CLINICAL HISTORY: Patient coming from home after a mechanical fall this afternoon. EMS reports that she has bilateral bruising on knees, bilateral skin tears on arms, bilateral hip replacements and right hip and leg pain and the right leg is shorter. Patient denies LOC, did not hit head, denies back and neck pain. Recently stopped taking blood thinners. Chronically on 2L of oxygen . FINDINGS: BONES AND JOINTS: No acute fracture. No focal osseous lesion. No joint dislocation. No significant joint effusion. Mild tricompartmental degenerative changes. SOFT TISSUES: Severe atherosclerotic vascular calcifications. IMPRESSION: 1. No acute fracture or dislocation. Electronically signed by: Norman Gatlin MD 06/29/2024 07:12 PM EDT RP Workstation: [id number]   DG Hip Unilat W or Wo Pelvis 2-3 Views Right Result Date: 06/29/2024 EXAM: 2 or more VIEW(S) XRAY OF THE BILATERAL HIP 06/29/2024 07:00:00 PM COMPARISON: None available. CLINICAL HISTORY: Injury. Reason for exam: injury. Triage notes: Patient coming from home after a mechanical fall this afternoon. EMS reports that she has bilateral bruising on knees, bilateral skin tears on arms, bilateral hip replacements and right hip and leg pain and the right leg is shorter. Patient denies LOC, did not hit head, denies back and neck pain. Recently stopped taking blood thinners. Chronically on 2L of oxygen . FINDINGS: BONES AND JOINTS: Bilateral hip arthroplasties are in place. There is a nondisplaced right proximal femoral shaft fracture just inferior to the greater trochanter. SOFT TISSUES: Iliac and left femoral  stents are in place. Vascular calcifications are present. IMPRESSION: 1. Nondisplaced periprosthetic right proximal femoral shaft fracture. Electronically signed by: Norman Gatlin MD 06/29/2024 07:11 PM EDT RP Workstation: [id number]    Review of Systems  HENT:  Negative for ear discharge, ear pain, hearing loss and tinnitus.   Eyes:  Negative for photophobia and pain.  Respiratory:  Negative for cough and shortness of breath.   Cardiovascular:  Negative for chest pain.  Gastrointestinal:  Negative for abdominal pain, nausea and vomiting.  Genitourinary:  Negative for dysuria, flank pain, frequency and urgency.  Musculoskeletal:  Positive for arthralgias (Right hip, bilateral arms). Negative for back pain, myalgias and neck pain.  Neurological:  Negative for dizziness and headaches.  Hematological:  Does not bruise/bleed easily.  Psychiatric/Behavioral:  The patient is not nervous/anxious.    Blood pressure (!) 162/87, pulse 95, temperature 98.2 F (36.8 C), temperature source Oral, resp. rate 18, weight 61.2 kg, SpO2 95%. Physical Exam Constitutional:      General: She is not in acute distress.    Appearance: She is well-developed. She is not diaphoretic.  HENT:     Head: Normocephalic and atraumatic.  Eyes:     General: No scleral icterus.       Right eye: No discharge.        Left eye: No discharge.     Conjunctiva/sclera: Conjunctivae normal.  Cardiovascular:     Rate and Rhythm: Normal rate and regular rhythm.  Pulmonary:     Effort: Pulmonary effort is normal. No respiratory distress.  Musculoskeletal:     Cervical back: Normal range of motion.     Comments: RLE No traumatic wounds, ecchymosis, or rash  Mod TTP hip  No knee or ankle effusion  Knee stable to varus/ valgus and anterior/posterior stress  Sens DPN, SPN, TN intact  Motor EHL, ext, flex, evers 5/5  DP 1+, PT 2+, No significant edema  Skin:    General: Skin is warm and dry.  Neurological:     Mental  Status: She is alert.  Psychiatric:        Mood and Affect: Mood normal.        Behavior: Behavior normal.     Assessment/Plan: Right hip fx -- Stem looks intact, plan non-operative management with WBAT using RW. F/u with [facility] on discharge.    Ozell DOROTHA Ned, PA-C Orthopedic Surgery [phone] 06/30/2024, 2:02 PM

## 2024-07-01 DIAGNOSIS — S72301A Unspecified fracture of shaft of right femur, initial encounter for closed fracture: Secondary | ICD-10-CM

## 2024-07-01 LAB — CBC WITH DIFFERENTIAL/PLATELET
Abs Immature Granulocytes: 0.12 K/uL — ABNORMAL HIGH (ref 0.00–0.07)
Basophils Absolute: 0 K/uL (ref 0.0–0.1)
Basophils Relative: 1 %
Eosinophils Absolute: 0.1 K/uL (ref 0.0–0.5)
Eosinophils Relative: 1 %
HCT: 27.3 % — ABNORMAL LOW (ref 36.0–46.0)
Hemoglobin: 8.9 g/dL — ABNORMAL LOW (ref 12.0–15.0)
Immature Granulocytes: 1 %
Lymphocytes Relative: 7 %
Lymphs Abs: 0.6 K/uL — ABNORMAL LOW (ref 0.7–4.0)
MCH: 33 pg (ref 26.0–34.0)
MCHC: 32.6 g/dL (ref 30.0–36.0)
MCV: 101.1 fL — ABNORMAL HIGH (ref 80.0–100.0)
Monocytes Absolute: 0.7 K/uL (ref 0.1–1.0)
Monocytes Relative: 9 %
Neutro Abs: 6.8 K/uL (ref 1.7–7.7)
Neutrophils Relative %: 81 %
Platelets: 267 K/uL (ref 150–400)
RBC: 2.7 MIL/uL — ABNORMAL LOW (ref 3.87–5.11)
RDW: 23.9 % — ABNORMAL HIGH (ref 11.5–15.5)
Smear Review: NORMAL
WBC: 8.3 K/uL (ref 4.0–10.5)
nRBC: 1.6 % — ABNORMAL HIGH (ref 0.0–0.2)

## 2024-07-01 LAB — COMPREHENSIVE METABOLIC PANEL WITH GFR
ALT: 11 U/L (ref 0–44)
AST: 18 U/L (ref 15–41)
Albumin: 3.3 g/dL — ABNORMAL LOW (ref 3.5–5.0)
Alkaline Phosphatase: 63 U/L (ref 38–126)
Anion gap: 11 (ref 5–15)
BUN: 23 mg/dL (ref 8–23)
CO2: 26 mmol/L (ref 22–32)
Calcium: 9.3 mg/dL (ref 8.9–10.3)
Chloride: 97 mmol/L — ABNORMAL LOW (ref 98–111)
Creatinine, Ser: 0.97 mg/dL (ref 0.44–1.00)
GFR, Estimated: >60 mL/min (ref >60)
Glucose, Bld: 122 mg/dL — ABNORMAL HIGH (ref 70–99)
Potassium: 4 mmol/L (ref 3.5–5.1)
Sodium: 134 mmol/L — ABNORMAL LOW (ref 135–145)
Total Bilirubin: 1 mg/dL (ref 0.0–1.2)
Total Protein: 6.8 g/dL (ref 6.5–8.1)

## 2024-07-01 LAB — MAGNESIUM: Magnesium: 1.9 mg/dL (ref 1.7–2.4)

## 2024-07-01 MED ORDER — ASPIRIN 81 MG PO TBEC: 81.0000 mg | DELAYED_RELEASE_TABLET | Freq: Every morning | ORAL | Status: DC

## 2024-07-01 NOTE — Progress Notes (Signed)
 PROGRESS NOTE    Marie YZAGUIRRE  FMW:[id number] DOB: 1950-11-09 DOA: 06/29/2024 PCP: Stephanie Charlene CROME, MD  Chief Complaint  Patient presents with   Fall    Brief Narrative:   Marie Johnson is Marie Johnson 73 y.o. female with medical history significant of COPD on chronic 2 L CAD, PAD, HTN ,HLD, bilateral hip replacements presented with mechanical fall after she tripped on her oxygen  tubing resulting in right hip pain. Denies head trauma, no LOC.  In the ED, vital signs fairly stable, saturating well on 2 L of O2 (at baseline), labs noted with WBC 10.6, hemoglobin 9.3, otherwise unremarkable.  Orthopedics consulted.  Admitted for further management.  Assessment & Plan:   Principal Problem:   Closed right hip fracture (HCC) Active Problems:   Coronary artery disease involving native coronary artery of native heart   COPD (chronic obstructive pulmonary disease) (HCC)   Hypertension   Chronic diastolic CHF (congestive heart failure) (HCC)   Tobacco abuse   Hyperlipidemia   Malnutrition of moderate degree  Acute periprosthetic fracture of the proximal right femur  Mechanical fall CT showed above Orthopedics consulted  Pain management Fall precautions   Chronic hypoxic respiratory failure COPD Appears stable at baseline, patient is on 2 L of O2 Stable    Normocytic anemia Anemia of chronic disease Vitamin B12 deficiency Baseline hemoglobin around 10-11 No evidence of significant bleeding, although CT of hip showed some ill-defined subcutaneous hematoma Anemia panel WNL, except for vitamin B12--> 371 Vitamin B12 supplementation Trend CBC, monitor closely   Hypomagnesemia Replaced as needed   CAD  PAD Hyperlipidemia  s/p LHC 03/2023 with mild - moderate non obstructive disease - recommended ASA daily at that time Apparently recently stopped plavix  2 weeks ago?  Seems like indication for DAPT was PAD (09/2022 vascular note recommends DAPT) Hold aspirin  for now Continue Lipitor     Chronic diastolic HF Currently appears euvolemic Repeat echo with EF 60 to 65%, no regional wall motion abnormality Hold home Lasix , Aldactone  for now, continue with verapamil  and monitor closely   Hypertension BP ok today Lasix  and aldactone  on hold Verapamil    Gout allopurinol      DVT prophylaxis: SCD Code Status: full Family Communication: none Disposition:   Status is: Inpatient Remains inpatient appropriate because: need for continued inpatient care   Consultants:  ortho  Procedures:  none  Antimicrobials:  Anti-infectives (From admission, onward)    None       Subjective: No new complaints Notes pain  Notes chronic o2 needs  Objective: Vitals:   06/30/24 1510 06/30/24 1550 06/30/24 2017 07/01/24 0454  BP: (!) 143/75  119/70 124/70  Pulse: 99 (!) 104    Resp: 16 20 18 18   Temp: 97.8 F (36.6 C)  98.9 F (37.2 C) 98.6 F (37 C)  TempSrc: Oral  Oral Oral  SpO2: 91% 92% 90% 90%  Weight:        Intake/Output Summary (Last 24 hours) at 07/01/2024 1902 Last data filed at 07/01/2024 0813 Gross per 24 hour  Intake --  Output 575 ml  Net -575 ml   Filed Weights   06/30/24 1100  Weight: 61.2 kg    Examination:  General exam: Appears calm and comfortable  Respiratory system: unlabored on 2 L, chronic cough  Cardiovascular system: RRR Gastrointestinal system: Abdomen is nondistended, soft and nontender.  Central nervous system: Alert and oriented. No focal neurological deficits. Extremities: no LEE - RLE ttp at hip    Data Reviewed:  I have personally reviewed following labs and imaging studies  CBC: Recent Labs  Lab 06/29/24 1910 06/30/24 0439 07/01/24 0451  WBC 10.6* 6.1 8.3  NEUTROABS 9.9*  --  6.8  HGB 9.3* 8.8* 8.9*  HCT 29.3* 27.3* 27.3*  MCV 102.1* 100.0 101.1*  PLT 304 300 267    Basic Metabolic Panel: Recent Labs  Lab 06/29/24 1910 06/29/24 2243 06/30/24 0439 07/01/24 0451  NA 138  --  136 134*  K 3.6  --  3.6  4.0  CL 99  --  96* 97*  CO2 29  --  26 26  GLUCOSE 103*  --  118* 122*  BUN 15  --  16 23  CREATININE 0.85  --  0.85 0.97  CALCIUM  9.3  --  8.9 9.3  MG  --  1.6*  --  1.9  PHOS  --  2.7  --   --     GFR: Estimated Creatinine Clearance: 48.4 mL/min (by C-G formula based on SCr of 0.97 mg/dL).  Liver Function Tests: Recent Labs  Lab 06/30/24 0439 07/01/24 0451  AST 17 18  ALT 11 11  ALKPHOS 70 63  BILITOT 1.6* 1.0  PROT 6.7 6.8  ALBUMIN  3.5 3.3*    CBG: No results for input(s): GLUCAP in the last 168 hours.   No results found for this or any previous visit (from the past 240 hours).       Radiology Studies: ECHOCARDIOGRAM COMPLETE Result Date: 06/30/2024    ECHOCARDIOGRAM REPORT   Patient Name:   Marie Johnson Date of Exam: 06/30/2024 Medical Rec #:  [id number]      Height:       67.5 in Accession #:    [id number]     Weight:       137.6 lb Date of Birth:  06-03-1951      BSA:          1.735 m Patient Age:    73 years       BP:           146/70 mmHg Patient Gender: F              HR:           95 bpm. Exam Location:  Inpatient Procedure: 2D Echo, Cardiac Doppler and Color Doppler (Both Spectral and Color            Flow Doppler were utilized during procedure). Indications:    Cardiomegaly, preoperative evaluation  History:        Patient has prior history of Echocardiogram examinations, most                 recent 03/22/2016. CHF, CAD, COPD, Signs/Symptoms:Chest Pain; Risk                 Factors:Current Smoker, Hypertension and Dyslipidemia.  Sonographer:    Juliene Rucks Referring Phys: [id number] ANASTASSIA DOUTOVA  Sonographer Comments: Image acquisition challenging due to COPD. IMPRESSIONS  1. Left ventricular ejection fraction, by estimation, is 60 to 65%. The left ventricle has normal function. The left ventricle has no regional wall motion abnormalities. Indeterminate diastolic filling due to E-Aithana Kushner fusion.  2. Right ventricular systolic function is normal. The right ventricular  size is normal. Tricuspid regurgitation signal is inadequate for assessing PA pressure.  3. Left atrial size was mildly dilated.  4. The mitral valve is grossly normal. Mild mitral valve regurgitation. No evidence of mitral stenosis.  5. The aortic valve  was not well visualized. Aortic valve regurgitation is not visualized. No aortic stenosis is present.  6. The inferior vena cava is normal in size with greater than 50% respiratory variability, suggesting right atrial pressure of 3 mmHg. FINDINGS  Left Ventricle: Left ventricular ejection fraction, by estimation, is 60 to 65%. The left ventricle has normal function. The left ventricle has no regional wall motion abnormalities. The left ventricular internal cavity size was normal in size. There is  no left ventricular hypertrophy. Indeterminate diastolic filling due to E-Sandhya Denherder fusion. Right Ventricle: The right ventricular size is normal. No increase in right ventricular wall thickness. Right ventricular systolic function is normal. Tricuspid regurgitation signal is inadequate for assessing PA pressure. Left Atrium: Left atrial size was mildly dilated. Right Atrium: Right atrial size was normal in size. Pericardium: Trivial pericardial effusion is present. Presence of epicardial fat layer. Mitral Valve: The mitral valve is grossly normal. Mild mitral annular calcification. Mild mitral valve regurgitation. No evidence of mitral valve stenosis. Tricuspid Valve: The tricuspid valve is grossly normal. Tricuspid valve regurgitation is trivial. No evidence of tricuspid stenosis. Aortic Valve: The aortic valve was not well visualized. Aortic valve regurgitation is not visualized. No aortic stenosis is present. Pulmonic Valve: The pulmonic valve was grossly normal. Pulmonic valve regurgitation is not visualized. No evidence of pulmonic stenosis. Aorta: The aortic root and ascending aorta are structurally normal, with no evidence of dilitation. Venous: The inferior vena cava is  normal in size with greater than 50% respiratory variability, suggesting right atrial pressure of 3 mmHg. IAS/Shunts: The atrial septum is grossly normal.  LEFT VENTRICLE PLAX 2D LVIDd:         4.60 cm     Diastology LVIDs:         2.90 cm     LV e' medial:    8.05 cm/s LV PW:         1.10 cm     LV E/e' medial:  10.5 LV IVS:        1.10 cm     LV e' lateral:   6.42 cm/s LVOT diam:     2.00 cm     LV E/e' lateral: 13.2 LV SV:         82 LV SV Index:   47 LVOT Area:     3.14 cm  LV Volumes (MOD) LV vol d, MOD A2C: 81.9 ml LV vol d, MOD A4C: 74.3 ml LV vol s, MOD A2C: 47.2 ml LV vol s, MOD A4C: 37.1 ml LV SV MOD A2C:     34.7 ml LV SV MOD A4C:     74.3 ml LV SV MOD BP:      36.6 ml RIGHT VENTRICLE RV Basal diam:  3.40 cm RV Mid diam:    2.90 cm RV S prime:     12.10 cm/s TAPSE (M-mode): 2.4 cm LEFT ATRIUM             Index        RIGHT ATRIUM           Index LA diam:        2.80 cm 1.61 cm/m   RA Area:     12.60 cm LA Vol (A2C):   68.8 ml 39.66 ml/m  RA Volume:   28.40 ml  16.37 ml/m LA Vol (A4C):   68.6 ml 39.55 ml/m LA Biplane Vol: 70.1 ml 40.41 ml/m  AORTIC VALVE LVOT Vmax:   150.00 cm/s LVOT Vmean:  104.000 cm/s  LVOT VTI:    0.262 m  AORTA Ao Root diam: 3.10 cm Ao Asc diam:  2.90 cm MITRAL VALVE MV Area (PHT): 5.84 cm     SHUNTS MV Decel Time: 130 msec     Systemic VTI:  0.26 m MV E velocity: 84.80 cm/s   Systemic Diam: 2.00 cm MV Brittin Belnap velocity: 128.00 cm/s MV E/Analeia Ismael ratio:  0.66 Darryle Decent MD Electronically signed by Darryle Decent MD Signature Date/Time: 06/30/2024/11:20:48 AM    Final    CT Hip Right Wo Contrast Result Date: 06/29/2024 CLINICAL DATA:  Hip replacement, periprosthetic fracture suspected EXAM: CT OF THE RIGHT HIP WITHOUT CONTRAST TECHNIQUE: Multidetector CT imaging of the right hip was performed according to the standard protocol. Multiplanar CT image reconstructions were also generated. RADIATION DOSE REDUCTION: This exam was performed according to the departmental dose-optimization  program which includes automated exposure control, adjustment of the mA and/or kV according to patient size and/or use of iterative reconstruction technique. COMPARISON:  Radiographs 06/29/2024 and 05/11/2021. FINDINGS: Bones/Joint/Cartilage Patient is status post bilateral total hip arthroplasty with associated mild beam hardening artifact. As demonstrated on the earlier radiographs, there is an acute periprosthetic fracture of the proximal right femur with up to 7 mm of medial displacement laterally. Fracture extends superiorly into the greater trochanter. No hardware loosening or displacement identified. There is no dislocation or fracture of the right hemipelvis. No significant hip joint effusion. There are mild sacroiliac degenerative changes bilaterally and multilevel lower lumbar spondylosis. Ligaments Suboptimally assessed by CT. Muscles and Tendons No focal or acute muscular findings are demonstrated. Soft tissues Ill-defined subcutaneous hematoma posterolateral to the proximal right femur, measuring up to 6.1 cm on coronal image 35/7. No organized fluid collection or other significant hematoma demonstrated. Status post femoral bypass grafting bilaterally. Underlying iliofemoral atherosclerosis. Possible small dependent gallstones in the gallbladder lumen. Previous hysterectomy noted. IMPRESSION: 1. Acute periprosthetic fracture of the proximal right femur as described. No hardware loosening or displacement identified. 2. Ill-defined subcutaneous hematoma posterolaterally. 3. No evidence of dislocation or fracture of the right hemipelvis. 4. Possible cholelithiasis. Electronically Signed   By: Elsie Perone M.D.   On: 06/29/2024 21:38   DG Chest 1 View Result Date: 06/29/2024 CLINICAL DATA:  Preop EXAM: CHEST  1 VIEW COMPARISON:  06/08/2023 FINDINGS: Cardiomegaly. There is hyperinflation of the lungs compatible with COPD. Bibasilar opacities likely reflect scarring or atelectasis. No effusions. No  acute bony abnormality. IMPRESSION: Cardiomegaly, COPD. Bibasilar atelectasis or scarring. No active disease. Electronically Signed   By: Franky Crease M.D.   On: 06/29/2024 21:17        Scheduled Meds:  allopurinol   300 mg Oral QPM   atorvastatin   80 mg Oral Daily   cyanocobalamin   1,000 mcg Oral Daily   ezetimibe   10 mg Oral Daily   feeding supplement  237 mL Oral TID BM   fluticasone  furoate-vilanterol  1 puff Inhalation Daily   multivitamin with minerals  1 tablet Oral Daily   pantoprazole   40 mg Oral BID   umeclidinium bromide   1 puff Inhalation Daily   verapamil   240 mg Oral Daily   Continuous Infusions:   LOS: 2 days    Time spent: over 30 min     Meliton Monte, MD [facility]   To contact the attending provider between 7A-7P or the covering provider during after hours 7P-7A, please log into the web site [email or web address] and access using universal [place] password for that web site. If you do  not have the password, please call the hospital operator.  07/01/2024, 7:02 PM

## 2024-07-01 NOTE — NC FL2 (Signed)
 [place]  MEDICAID FL2 LEVEL OF CARE FORM     IDENTIFICATION  Patient Name: Marie Johnson Birthdate: 10-08-1950 Sex: female Admission Date (Current Location): 06/29/2024  [facility] [place] Number:  Producer, television/film/video and Address:  [facility] [place], [address], [place], [place] [address]      Provider Number: [id number]  Attending Physician Name and Address:  Perri DELENA Meliton Mickey., *  Relative Name and Phone Number:  Compton,Maryann Daughter [phone]    Current Level of Care: Hospital Recommended Level of Care: Skilled Nursing Facility Prior Approval Number:    Date Approved/Denied:   PASRR Number: [id number]  Discharge Plan: SNF    Current Diagnoses: Patient Active Problem List   Diagnosis Date Noted   Malnutrition of moderate degree 06/30/2024   Closed right hip fracture (HCC) 06/29/2024   Cellulitis of right lower extremity 06/05/2023   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   Demand ischemia (HCC) 04/06/2023   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   Critical limb ischemia of left lower extremity (HCC) 07/26/2022   Thoracic aortic aneurysm 04/22/2022   PAD (peripheral artery disease) 03/18/2020   Critical limb ischemia with history of revascularization of same extremity (HCC) 03/08/2020   Chronic diastolic CHF (congestive heart failure) (HCC) 07/14/2018   Acute sinusitis 12/05/2016   Premature ventricular contraction 03/26/2016   Chest pain 02/22/2016   Palpitations 02/22/2016   Coronary artery disease involving native coronary artery of native heart 07/17/2013   PVD (peripheral vascular disease) 07/17/2013   Aortic atherosclerosis 07/17/2013   AAA (abdominal aortic aneurysm) 07/17/2013   Tobacco abuse 07/17/2013   Hypertension 07/17/2013   Hyperlipidemia 07/17/2013   COPD (chronic obstructive pulmonary disease) (HCC) 07/17/2013   GERD (gastroesophageal reflux  disease) 07/17/2013    Orientation RESPIRATION BLADDER Height & Weight     Self, Time, Situation, Place  O2   Weight: 134 lb 14.7 oz (61.2 kg) Height:     BEHAVIORAL SYMPTOMS/MOOD NEUROLOGICAL BOWEL NUTRITION STATUS        Diet (see discharge summary)  AMBULATORY STATUS COMMUNICATION OF NEEDS Skin   Total Care Verbally Skin abrasions, Other (Comment) (ecchymosis)                       Personal Care Assistance Level of Assistance  Bathing, Feeding, Dressing, Total care Bathing Assistance: Maximum assistance Feeding assistance: Limited assistance Dressing Assistance: Maximum assistance Total Care Assistance: Maximum assistance   Functional Limitations Info  Sight, Hearing, Speech Sight Info: Adequate Hearing Info: Adequate Speech Info: Adequate    SPECIAL CARE FACTORS FREQUENCY  PT (By licensed PT), OT (By licensed OT)     PT Frequency: 5x week OT Frequency: 5x week            Contractures Contractures Info: Not present    Additional Factors Info  Code Status, Allergies Code Status Info: full Allergies Info: Bee Venom, Insect Extract, Farxiga  (Dapagliflozin ), Lyrica (Pregabalin), Neurontin (Gabapentin), Nsaids, Latex           Current Medications (07/01/2024):  This is the current hospital active medication list Current Facility-Administered Medications  Medication Dose Route Frequency Provider Last Rate Last Admin   albuterol  (PROVENTIL ) (2.5 MG/3ML) 0.083% nebulizer solution 2.5 mg  2.5 mg Nebulization Q2H PRN Ezenduka, Nkeiruka J, MD       allopurinol  (ZYLOPRIM ) tablet 300 mg  300 mg Oral QPM Ezenduka, Nkeiruka J, MD   300 mg  at 06/30/24 1616   atorvastatin  (LIPITOR ) tablet 80 mg  80 mg Oral Daily Doutova, Anastassia, MD   80 mg at 07/01/24 9078   cyanocobalamin  (VITAMIN B12) tablet 1,000 mcg  1,000 mcg Oral Daily Ezenduka, Nkeiruka J, MD   1,000 mcg at 07/01/24 9078   ezetimibe  (ZETIA ) tablet 10 mg  10 mg Oral Daily Doutova, Anastassia, MD   10 mg at  07/01/24 0921   feeding supplement (ENSURE PLUS HIGH PROTEIN) liquid 237 mL  237 mL Oral TID BM Ezenduka, Nkeiruka J, MD   237 mL at 06/30/24 2018   fluticasone  furoate-vilanterol (BREO ELLIPTA) 200-25 MCG/ACT 1 puff  1 puff Inhalation Daily Ezenduka, Nkeiruka J, MD   1 puff at 07/01/24 0746   HYDROmorphone  (DILAUDID ) injection 0.5 mg  0.5 mg Intravenous Q2H PRN Doutova, Anastassia, MD   0.5 mg at 06/30/24 1135   melatonin tablet 3 mg  3 mg Oral QHS PRN Doutova, Anastassia, MD   3 mg at 06/30/24 2259   methocarbamol (ROBAXIN) tablet 500 mg  500 mg Oral Q6H PRN Doutova, Anastassia, MD   500 mg at 07/01/24 1125   Or   methocarbamol (ROBAXIN) injection 500 mg  500 mg Intravenous Q6H PRN Doutova, Anastassia, MD       multivitamin with minerals tablet 1 tablet  1 tablet Oral Daily Ezenduka, Nkeiruka J, MD   1 tablet at 07/01/24 9077   oxyCODONE  (Oxy IR/ROXICODONE ) immediate release tablet 10 mg  10 mg Oral QID PRN Doutova, Anastassia, MD   10 mg at 07/01/24 9078   pantoprazole  (PROTONIX ) EC tablet 40 mg  40 mg Oral BID Doutova, Anastassia, MD   40 mg at 07/01/24 9078   umeclidinium bromide  (INCRUSE ELLIPTA ) 62.5 MCG/ACT 1 puff  1 puff Inhalation Daily Reome, Earle J, RPH   1 puff at 07/01/24 0746   verapamil  (CALAN -SR) CR tablet 240 mg  240 mg Oral Daily Doutova, Anastassia, MD   240 mg at 07/01/24 9078   Facility-Administered Medications Ordered in Other Encounters  Medication Dose Route Frequency Provider Last Rate Last Admin   DOBUTamine  (DOBUTREX ) 1,000 mcg/mL in dextrose  5% 250 mL infusion  30 mcg/kg/min Intravenous Titrated Maranda Leim DEL, MD         Discharge Medications: Please see discharge summary for a list of discharge medications.  Relevant Imaging Results:  Relevant Lab Results:   Additional Information SSN: [id number]  Bridget Cordella Simmonds, LCSW

## 2024-07-01 NOTE — Plan of Care (Signed)
   Problem: Coping: Goal: Level of anxiety will decrease Outcome: Progressing

## 2024-07-01 NOTE — TOC Initial Note (Addendum)
 Transition of Care [employer]) - Initial/Assessment Note    Patient Details  Name: Marie Johnson MRN: [id number] Date of Birth: January 06, 1951  Transition of Care [facility]) CM/SW Contact:    Bridget Cordella Simmonds, LCSW Phone Number: 07/01/2024, 3:01 PM  Clinical Narrative:    CSW met with pt regarding PT recommendation for SNF.  Pt from home with husband Norleen, no current services.  Permission given to speak with husband and all listed children.  Pt is agreeable to SNF, requesting [facility].  Referral sent out in hub for SNF.  CSW reached out to [facility] to review.             1530: [facility] does offer bed, CSW confirmed with pt and she will accept.  SNF auth request submitted in [place] and approved: [id number], 5 days: 10/16-10/20.   Expected Discharge Plan: Skilled Nursing Facility Barriers to Discharge: Continued Medical Work up, SNF Pending bed offer   Patient Goals and CMS Choice Patient states their goals for this hospitalization and ongoing recovery are:: back to normal, walking   Choice offered to / list presented to : Patient (requesting [facility])      Expected Discharge Plan and Services In-house Referral: Clinical Social Work   Post Acute Care Choice: Skilled Nursing Facility Living arrangements for the past 2 months: Single Family Home                                      Prior Living Arrangements/Services Living arrangements for the past 2 months: Single Family Home Lives with:: Spouse Patient language and need for interpreter reviewed:: Yes Do you feel safe going back to the place where you live?: Yes      Need for Family Participation in Patient Care: Yes (Comment) Care giver support system in place?: Yes (comment) Current home services: Other (comment) (none) Criminal Activity/Legal Involvement Pertinent to Current Situation/Hospitalization: No - Comment as needed  Activities of Daily Living   ADL Screening (condition at time of admission) Independently  performs ADLs?: Yes (appropriate for developmental age) Is the patient deaf or have difficulty hearing?: Yes Does the patient have difficulty seeing, even when wearing glasses/contacts?: Yes Does the patient have difficulty concentrating, remembering, or making decisions?: No  Permission Sought/Granted Permission sought to share information with : Family Supports Permission granted to share information with : Yes, Verbal Permission Granted  Share Information with NAME: husband, all listed children  Permission granted to share info w AGENCY: SNF        Emotional Assessment Appearance:: Appears stated age Attitude/Demeanor/Rapport: Engaged Affect (typically observed): Appropriate, Pleasant Orientation: : Oriented to Self, Oriented to Place, Oriented to  Time, Oriented to Situation      Admission diagnosis:  Closed right hip fracture (HCC) [S72.001A] Patient Active Problem List   Diagnosis Date Noted   Malnutrition of moderate degree 06/30/2024   Closed right hip fracture (HCC) 06/29/2024   Cellulitis of right lower extremity 06/05/2023   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   Demand ischemia (HCC) 04/06/2023   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   Critical limb ischemia of left lower extremity (HCC) 07/26/2022   Thoracic aortic aneurysm 04/22/2022   PAD (peripheral artery disease) 03/18/2020   Critical limb ischemia with history of revascularization of same extremity (HCC) 03/08/2020   Chronic diastolic CHF (congestive heart failure) (HCC) 07/14/2018   Acute  sinusitis 12/05/2016   Premature ventricular contraction 03/26/2016   Chest pain 02/22/2016   Palpitations 02/22/2016   Coronary artery disease involving native coronary artery of native heart 07/17/2013   PVD (peripheral vascular disease) 07/17/2013   Aortic atherosclerosis 07/17/2013   AAA (abdominal aortic aneurysm) 07/17/2013   Tobacco abuse  07/17/2013   Hypertension 07/17/2013   Hyperlipidemia 07/17/2013   COPD (chronic obstructive pulmonary disease) (HCC) 07/17/2013   GERD (gastroesophageal reflux disease) 07/17/2013   PCP:  Stephanie Charlene CROME, MD Pharmacy:   [facility] [phone] [place], [place] - [address] AT [facility] [address] [place] [address] Phone: [phone] Fax: [phone]  [facility] - [place] [facility] [address] [place] [address] Phone: [phone] Fax: [phone]  [facility] - [place] - [address] [place] [address] Phone: [phone] Fax: [phone]     Social Drivers of Health (SDOH) Social History: SDOH Screenings   Food Insecurity: No Food Insecurity (06/29/2024)  Housing: High Risk (06/29/2024)  Transportation Needs: No Transportation Needs (06/29/2024)  Utilities: Not At Risk (06/29/2024)  Depression (PHQ2-9): Low Risk  (05/02/2023)  Social Connections: Moderately Isolated (06/29/2024)  Tobacco Use: Medium Risk (06/29/2024)   SDOH Interventions:     Readmission Risk Interventions    06/06/2023   12:35 PM  Readmission Risk Prevention Plan  Transportation Screening Complete  PCP or Specialist Appt within 5-7 Days Complete  Home Care Screening Complete  Medication Review (RN CM) Complete

## 2024-07-01 NOTE — Evaluation (Signed)
 Occupational Therapy Evaluation Patient Details Name: Marie Johnson MRN: [id number] DOB: 01-Jan-1951 Today's Date: 07/01/2024   History of Present Illness   Pt is a 73 y/o female who presents 06/29/2024 s/p fall at home. She sustained a R periprosthetic femoral shaft fracture. Ortho consulted and plan is for non-op management; WBAT RLE. PMH significant for AAA, asthma, CAD, CA, CHF, COPD on 2L/min O2 at baseline, HTN, neuropathy, OA, osteoporosis, PVC's, PVD, fem-pop 2021, R rotator cuff repair.     Clinical Impressions PTA, pt lived with spouse and was independent in BADl and IADL. Pt reports much more sedentary since beginning to wear supplemental O2 several months ago. Pt needing max A +2 for bed mobility and mod A +2 for STS this session. Pt with good tolerance of sitting EOB but does benefit from UE support. Pt with high anxiety with movement and needing cues throughout for PLB as well as pt working herself up needing to use rescue inhaler during session as well as up to 4L/min supplemental O2 via [place]. Patient will benefit from continued inpatient follow up therapy, <3 hours/day      If plan is discharge home, recommend the following:   Two people to help with walking and/or transfers;Two people to help with bathing/dressing/bathroom;Assistance with cooking/housework;Assist for transportation;Help with stairs or ramp for entrance     Functional Status Assessment   Patient has had a recent decline in their functional status and demonstrates the ability to make significant improvements in function in a reasonable and predictable amount of time.     Equipment Recommendations   Other (comment) (defer)     Recommendations for Other Services         Precautions/Restrictions   Precautions Precautions: Fall Recall of Precautions/Restrictions: Impaired Precaution/Restrictions Comments: Requires frequent cues Restrictions Weight Bearing Restrictions Per Provider Order:  Yes RLE Weight Bearing Per Provider Order: Weight bearing as tolerated     Mobility Bed Mobility Overal bed mobility: Needs Assistance Bed Mobility: Supine to Sit     Supine to sit: Max assist, +2 for physical assistance, HOB elevated, Used rails     General bed mobility comments: Heavy +2 assist required due to increased pain with movement and high anxiety around movement attempts. HOB elevated. Pt unable to reach across fully to grab L rail with R UE. Bed pad and +2 utilized for pivot around to EOB.    Transfers Overall transfer level: Needs assistance Equipment used: Ambulation equipment used Transfers: Sit to/from Stand, Bed to chair/wheelchair/BSC Sit to Stand: Mod assist, +2 physical assistance, +2 safety/equipment, From elevated surface           General transfer comment: Pt stood with +2 mod assist to [place]. Increased time due to pt anxiety around pain and breathing. Supplemental O2 increased to 4L/min per pt request. Transfer via Lift Equipment: Stedy    Balance Overall balance assessment: Needs assistance Sitting-balance support: Feet supported, No upper extremity supported Sitting balance-Leahy Scale: Fair     Standing balance support: Bilateral upper extremity supported, Reliant on assistive device for balance Standing balance-Leahy Scale: Poor                             ADL either performed or assessed with clinical judgement   ADL Overall ADL's : Needs assistance/impaired Eating/Feeding: Set up;Sitting   Grooming: Set up;Sitting;Contact guard assist   Upper Body Bathing: Minimal assistance;Sitting   Lower Body Bathing: Total assistance;+2 for physical assistance;+2 for  safety/equipment   Upper Body Dressing : Minimal assistance;Sitting   Lower Body Dressing: Total assistance;+2 for physical assistance;+2 for safety/equipment   Toilet Transfer: Moderate assistance;+2 for physical assistance;+2 for safety/equipment Toilet Transfer  Details (indicate cue type and reason): stedy                 Vision   Vision Assessment?: No apparent visual deficits     Perception         Praxis         Pertinent Vitals/Pain Pain Assessment Pain Assessment: 0-10 Pain Score: 8  Pain Location: R hip Pain Descriptors / Indicators: Grimacing, Guarding, Moaning, Aching Pain Intervention(s): Limited activity within patient's tolerance, Monitored during session     Extremity/Trunk Assessment Upper Extremity Assessment Upper Extremity Assessment: RUE deficits/detail RUE Deficits / Details: Pt appears to have decreased AROM of R shoulder consistent with prior rotator cuff repair. Not formally assessed by PT.   Lower Extremity Assessment Lower Extremity Assessment: Defer to PT evaluation   Cervical / Trunk Assessment Cervical / Trunk Assessment: Other exceptions Cervical / Trunk Exceptions: Pt reports back pain around my kidneys. Noted red area across lower back which appears to be scraped from fall. RN present to assess when pt sitting EOB.   Communication Communication Communication: No apparent difficulties   Cognition Arousal: Alert Behavior During Therapy: Anxious Cognition: No family/caregiver present to determine baseline             OT - Cognition Comments: follows one step commands, limited by anxiety with movement                 Following commands: Intact       Cueing  General Comments   Cueing Techniques: Verbal cues;Gestural cues  pt with anxiety with mobility requiring cues for PLB throughout session   Exercises     Shoulder Instructions      Home Living Family/patient expects to be discharged to:: Private residence Living Arrangements: Spouse/significant other Available Help at Discharge: Family;Available 24 hours/day Type of Home: Mobile home Home Access: Ramped entrance     Home Layout: One level     Bathroom Shower/Tub: Chief Strategy Officer:  Handicapped height     Home Equipment: Pharmacist, hospital (2 wheels);Grab bars - tub/shower;Grab bars - toilet;Hand held shower head;BSC/3in1          Prior Functioning/Environment Prior Level of Function : Independent/Modified Independent             Mobility Comments: Has not driven in 3-4 months. Daughter bringing in groceries/meds. Pt ambulating without an AD ADLs Comments: Independent, has been cooking/cleaning    OT Problem List: Decreased strength;Decreased activity tolerance;Impaired balance (sitting and/or standing);Decreased safety awareness;Decreased knowledge of use of DME or AE;Decreased knowledge of precautions;Pain   OT Treatment/Interventions: Self-care/ADL training;Therapeutic exercise;DME and/or AE instruction;Therapeutic activities;Patient/family education;Balance training      OT Goals(Current goals can be found in the care plan section)   Acute Rehab OT Goals Patient Stated Goal: get better OT Goal Formulation: With patient Time For Goal Achievement: 07/15/24 Potential to Achieve Goals: Good   OT Frequency:  Min 2X/week    Co-evaluation PT/OT/SLP Co-Evaluation/Treatment: Yes Reason for Co-Treatment: Complexity of the patient's impairments (multi-system involvement);For patient/therapist safety;To address functional/ADL transfers PT goals addressed during session: Mobility/safety with mobility;Balance;Proper use of DME OT goals addressed during session: ADL's and self-care      AM-PAC OT 6 Clicks Daily Activity     Outcome Measure Help  from another person eating meals?: A Little Help from another person taking care of personal grooming?: A Little Help from another person toileting, which includes using toliet, bedpan, or urinal?: Total Help from another person bathing (including washing, rinsing, drying)?: A Lot Help from another person to put on and taking off regular upper body clothing?: A Little Help from another person to put on  and taking off regular lower body clothing?: Total 6 Click Score: 13   End of Session Equipment Utilized During Treatment: Gait belt;Oxygen  Nurse Communication: Mobility status;Need for lift equipment  Activity Tolerance: Patient tolerated treatment well Patient left: in chair;with call bell/phone within reach;with chair alarm set  OT Visit Diagnosis: Unsteadiness on feet (R26.81);Muscle weakness (generalized) (M62.81);History of falling (Z91.81);Pain Pain - Right/Left: Right Pain - part of body: Hip                Time: 9055-8963 OT Time Calculation (min): 52 min Charges:  OT General Charges $OT Visit: 1 Visit OT Evaluation $OT Eval Moderate Complexity: 1 Mod  Elma JONETTA Lebron FREDERICK, OTR/L [facility] Acute Rehabilitation Office: [phone]   Elma JONETTA Lebron 07/01/2024, 1:37 PM

## 2024-07-01 NOTE — Progress Notes (Signed)
 CSW spoke with pt regarding SDOH: housing. Per pt, she and her husband own their home, have been there for 30 years and it is paid off.  They do struggle with bills in general, particularly with medical bills recently.  Their children are helpful. Cathlyn Ferry, MSW, LCSW 10/15/20253:54 PM

## 2024-07-01 NOTE — Evaluation (Signed)
 Physical Therapy Evaluation  Patient Details Name: Marie Johnson MRN: [id number] DOB: 03-17-1951 Today's Date: 07/01/2024  History of Present Illness  Pt is a 73 y/o female who presents 06/29/2024 s/p fall at home. She sustained a R periprosthetic femoral shaft fracture. Ortho consulted and plan is for non-op management; WBAT RLE. PMH significant for AAA, asthma, CAD, CA, CHF, COPD on 2L/min O2 at baseline, HTN, neuropathy, OA, osteoporosis, PVC's, PVD, fem-pop 2021, R rotator cuff repair.   Clinical Impression  Pt admitted with above diagnosis. Pt currently with functional limitations due to the deficits listed below (see PT Problem List). At the time of PT eval pt was able to perform transfers with up to +2 max assist. Stedy utilized as pt unable to take steps this session due to pain. High anxiety around pain and breathing. Asking for rescue inhaler and supplemental O2 to be increased to 4L/min for comfort. Significantly increased time required for each transition. Recommend continued PT <3 hours/day at d/c. Pt will benefit from acute skilled PT to increase their independence and safety with mobility to allow discharge.           If plan is discharge home, recommend the following: Two people to help with walking and/or transfers;Two people to help with bathing/dressing/bathroom;Assistance with cooking/housework;Help with stairs or ramp for entrance;Assist for transportation   Can travel by private vehicle   No    Equipment Recommendations Wheelchair (measurements PT);Wheelchair cushion (measurements PT)  Recommendations for Other Services       Functional Status Assessment Patient has had a recent decline in their functional status and demonstrates the ability to make significant improvements in function in a reasonable and predictable amount of time.     Precautions / Restrictions Precautions Precautions: Fall Recall of Precautions/Restrictions: Impaired Precaution/Restrictions  Comments: Requires frequent cues Restrictions Weight Bearing Restrictions Per Provider Order: Yes RLE Weight Bearing Per Provider Order: Weight bearing as tolerated      Mobility  Bed Mobility Overal bed mobility: Needs Assistance Bed Mobility: Supine to Sit     Supine to sit: Max assist, +2 for physical assistance, HOB elevated, Used rails     General bed mobility comments: Heavy +2 assist required due to increased pain with movement and high anxiety around movement attempts. HOB elevated. Pt unable to reach across fully to grab L rail with R UE. Bed pad and +2 utilized for pivot around to EOB.    Transfers Overall transfer level: Needs assistance Equipment used: Ambulation equipment used Transfers: Sit to/from Stand, Bed to chair/wheelchair/BSC Sit to Stand: Mod assist, +2 physical assistance, +2 safety/equipment, From elevated surface           General transfer comment: Pt stood with +2 mod assist to [place]. Increased time due to pt anxiety around pain and breathing. Supplemental O2 increased to 4L/min per pt request. Transfer via Lift Equipment: Stedy  Ambulation/Gait               General Gait Details: Unable to progress to gait training at this time.  Stairs            Wheelchair Mobility     Tilt Bed    Modified Rankin (Stroke Patients Only)       Balance Overall balance assessment: Needs assistance Sitting-balance support: Feet supported, No upper extremity supported Sitting balance-Leahy Scale: Fair     Standing balance support: Bilateral upper extremity supported, Reliant on assistive device for balance Standing balance-Leahy Scale: Poor  Pertinent Vitals/Pain Pain Assessment Pain Assessment: 0-10 Pain Score: 8  Pain Location: R hip Pain Descriptors / Indicators: Grimacing, Guarding, Moaning, Aching Pain Intervention(s): Limited activity within patient's tolerance, Monitored during session,  Repositioned    Home Living Family/patient expects to be discharged to:: Private residence Living Arrangements: Spouse/significant other Available Help at Discharge: Family;Available 24 hours/day Type of Home: Mobile home Home Access: Ramped entrance       Home Layout: One level Home Equipment: Pharmacist, hospital (2 wheels);Grab bars - tub/shower;Grab bars - toilet;Hand held shower head;BSC/3in1      Prior Function Prior Level of Function : Independent/Modified Independent             Mobility Comments: Has not driven in 3-4 months. Daughter bringing in groceries/meds. Pt ambulating without an AD ADLs Comments: Independent, has been cooking/cleaning     Extremity/Trunk Assessment   Upper Extremity Assessment Upper Extremity Assessment: RUE deficits/detail RUE Deficits / Details: Pt appears to have decreased AROM of R shoulder consistent with prior rotator cuff repair. Not formally assessed by PT.    Lower Extremity Assessment Lower Extremity Assessment: RLE deficits/detail RLE Deficits / Details: Acute pain, decreased strength and AROM consistent with acute fracture. RLE: Unable to fully assess due to pain RLE Sensation: history of peripheral neuropathy RLE Coordination: decreased gross motor    Cervical / Trunk Assessment Cervical / Trunk Assessment: Other exceptions Cervical / Trunk Exceptions: Pt reports back pain around my kidneys. Noted red area across lower back which appears to be scraped from fall. RN present to assess when pt sitting EOB.  Communication   Communication Communication: No apparent difficulties    Cognition Arousal: Alert Behavior During Therapy: Anxious   PT - Cognitive impairments: No apparent impairments                         Following commands: Intact       Cueing Cueing Techniques: Verbal cues, Gestural cues     General Comments      Exercises     Assessment/Plan    PT Assessment Patient needs  continued PT services  PT Problem List Decreased strength;Decreased range of motion;Decreased activity tolerance;Decreased balance;Decreased mobility;Decreased knowledge of use of DME;Decreased safety awareness;Decreased knowledge of precautions;Cardiopulmonary status limiting activity;Pain       PT Treatment Interventions DME instruction;Gait training;Functional mobility training;Therapeutic activities;Therapeutic exercise;Balance training;Wheelchair mobility training;Patient/family education    PT Goals (Current goals can be found in the Care Plan section)  Acute Rehab PT Goals Patient Stated Goal: Decrease pain PT Goal Formulation: With patient Time For Goal Achievement: 07/15/24 Potential to Achieve Goals: Good    Frequency Min 2X/week     Co-evaluation PT/OT/SLP Co-Evaluation/Treatment: Yes Reason for Co-Treatment: Complexity of the patient's impairments (multi-system involvement);For patient/therapist safety;To address functional/ADL transfers PT goals addressed during session: Mobility/safety with mobility;Balance;Proper use of DME         AM-PAC PT 6 Clicks Mobility  Outcome Measure Help needed turning from your back to your side while in a flat bed without using bedrails?: Total Help needed moving from lying on your back to sitting on the side of a flat bed without using bedrails?: Total Help needed moving to and from a bed to a chair (including a wheelchair)?: Total Help needed standing up from a chair using your arms (e.g., wheelchair or bedside chair)?: Total Help needed to walk in hospital room?: Total Help needed climbing 3-5 steps with a railing? : Total 6 Click  Score: 6    End of Session Equipment Utilized During Treatment: Gait belt;Oxygen  Activity Tolerance: Patient limited by pain (Limited by anxiety) Patient left: in chair;with call bell/phone within reach;with chair alarm set Nurse Communication: Mobility status;Need for lift equipment [place]) PT Visit  Diagnosis: Pain;Difficulty in walking, not elsewhere classified (R26.2);History of falling (Z91.81) Pain - Right/Left: Right Pain - part of body: Hip    Time: 9055-8965 PT Time Calculation (min) (ACUTE ONLY): 50 min   Charges:   PT Evaluation $PT Eval Moderate Complexity: 1 Mod PT Treatments $Gait Training: 8-22 mins PT General Charges $$ ACUTE PT VISIT: 1 Visit         Leita Sable, PT, DPT Acute Rehabilitation Services Secure Chat Preferred Office: [phone]   [name] 07/01/2024, 11:48 AM

## 2024-07-02 ENCOUNTER — Inpatient Hospital Stay (HOSPITAL_COMMUNITY)

## 2024-07-02 DIAGNOSIS — S72301A Unspecified fracture of shaft of right femur, initial encounter for closed fracture: Secondary | ICD-10-CM | POA: Diagnosis not present

## 2024-07-02 LAB — CBC
HCT: 27.2 % — ABNORMAL LOW (ref 36.0–46.0)
Hemoglobin: 8.8 g/dL — ABNORMAL LOW (ref 12.0–15.0)
MCH: 32.4 pg (ref 26.0–34.0)
MCHC: 32.4 g/dL (ref 30.0–36.0)
MCV: 100 fL (ref 80.0–100.0)
Platelets: 254 K/uL (ref 150–400)
RBC: 2.72 MIL/uL — ABNORMAL LOW (ref 3.87–5.11)
RDW: 24.5 % — ABNORMAL HIGH (ref 11.5–15.5)
WBC: 9.1 K/uL (ref 4.0–10.5)
nRBC: 1.3 % — ABNORMAL HIGH (ref 0.0–0.2)

## 2024-07-02 LAB — BASIC METABOLIC PANEL WITH GFR
Anion gap: 12 (ref 5–15)
BUN: 22 mg/dL (ref 8–23)
CO2: 26 mmol/L (ref 22–32)
Calcium: 9.1 mg/dL (ref 8.9–10.3)
Chloride: 98 mmol/L (ref 98–111)
Creatinine, Ser: 0.76 mg/dL (ref 0.44–1.00)
GFR, Estimated: >60 mL/min (ref >60)
Glucose, Bld: 121 mg/dL — ABNORMAL HIGH (ref 70–99)
Potassium: 3.4 mmol/L — ABNORMAL LOW (ref 3.5–5.1)
Sodium: 136 mmol/L (ref 135–145)

## 2024-07-02 LAB — PHOSPHORUS: Phosphorus: 4.3 mg/dL (ref 2.5–4.6)

## 2024-07-02 MED ORDER — OXYCODONE HCL 5 MG PO TABS
15.0000 mg | ORAL_TABLET | ORAL | Status: DC | PRN
  Administered 2024-07-02 – 2024-07-11 (×14): 15 mg via ORAL
  Filled 2024-07-02 (×18): qty 3

## 2024-07-02 MED ORDER — OXYCODONE HCL 5 MG PO TABS
10.0000 mg | ORAL_TABLET | ORAL | Status: DC | PRN
  Administered 2024-07-05: 10 mg via ORAL
  Filled 2024-07-02: qty 2

## 2024-07-02 MED ORDER — ACETAMINOPHEN 500 MG PO TABS
1000.0000 mg | ORAL_TABLET | Freq: Three times a day (TID) | ORAL | Status: DC
  Filled 2024-07-02 (×7): qty 2

## 2024-07-02 MED ORDER — SIMETHICONE 80 MG PO CHEW
80.0000 mg | CHEWABLE_TABLET | Freq: Four times a day (QID) | ORAL | Status: DC | PRN
  Administered 2024-07-02 – 2024-07-06 (×3): 80 mg via ORAL
  Filled 2024-07-02 (×4): qty 1

## 2024-07-02 MED ORDER — POLYETHYLENE GLYCOL 3350 17 G PO PACK
17.0000 g | PACK | Freq: Two times a day (BID) | ORAL | Status: DC
  Administered 2024-07-02 – 2024-07-11 (×11): 17 g via ORAL
  Filled 2024-07-02 (×13): qty 1

## 2024-07-02 MED ORDER — POTASSIUM CHLORIDE CRYS ER 20 MEQ PO TBCR
40.0000 meq | EXTENDED_RELEASE_TABLET | Freq: Once | ORAL | Status: AC
Start: 2024-07-02 — End: 2024-07-02
  Administered 2024-07-02: 40 meq via ORAL
  Filled 2024-07-02: qty 2

## 2024-07-02 MED ORDER — ACETAMINOPHEN 325 MG PO TABS: 650.0000 mg | ORAL_TABLET | Freq: Four times a day (QID) | ORAL | Status: DC | PRN

## 2024-07-02 NOTE — Progress Notes (Signed)
 PROGRESS NOTE    Marie Johnson  FMW:[id number] DOB: 01-07-1951 DOA: 06/29/2024 PCP: Stephanie Charlene CROME, MD  Chief Complaint  Patient presents with   Fall    Brief Narrative:   Marie Johnson is Marie Johnson 73 y.o. female with medical history significant of COPD on chronic 2 L CAD, PAD, HTN ,HLD, bilateral hip replacements presented with mechanical fall after she tripped on her oxygen  tubing resulting in right hip pain. Denies head trauma, no LOC.  In the ED, vital signs fairly stable, saturating well on 2 L of O2 (at baseline), labs noted with WBC 10.6, hemoglobin 9.3, otherwise unremarkable.  Orthopedics consulted.  Admitted for further management.  Assessment & Plan:   Principal Problem:   Closed right hip fracture (HCC) Active Problems:   Coronary artery disease involving native coronary artery of native heart   COPD (chronic obstructive pulmonary disease) (HCC)   Hypertension   Chronic diastolic CHF (congestive heart failure) (HCC)   Tobacco abuse   Hyperlipidemia   Malnutrition of moderate degree   Closed fracture of shaft of right femur (HCC)  Acute periprosthetic fracture of the proximal right femur  Mechanical fall CT showed periprosthetic fracture of proximal R femur, ill defined subcutaneous hematoma posterolaterally Orthopedics consulted  Pain management - will increase frequency and dose of oxycodone  given her chronic use at home.  Scheduled APAP.  Robaxin. Fall precautions   Abdominal Distension Last BM 10/13 Hx diastasis recti  Will follow KUB Bowel regimen  Chronic hypoxic respiratory failure COPD Appears stable at baseline, patient is on 2 L of O2 Stable    Normocytic anemia Anemia of chronic disease Vitamin B12 deficiency Baseline hemoglobin around 10-11 No evidence of significant bleeding, although CT of hip showed some ill-defined subcutaneous hematoma Anemia panel WNL, except for vitamin B12--> 371 Vitamin B12 supplementation Trend CBC, monitor  closely   Hypomagnesemia Replaced as needed   CAD  PAD Hyperlipidemia  s/p LHC 03/2023 with mild - moderate non obstructive disease - recommended ASA daily at that time Apparently recently stopped plavix  2 weeks ago?  Seems like indication for DAPT was PAD (09/2022 vascular note recommends DAPT) Hold aspirin  for now Continue Lipitor    Chronic diastolic HF Currently appears euvolemic Repeat echo with EF 60 to 65%, no regional wall motion abnormality Hold home Lasix , Aldactone  for now, continue with verapamil  and monitor closely   Hypertension BP ok today Lasix  and aldactone  on hold Verapamil    Gout allopurinol      DVT prophylaxis: SCD Code Status: full Family Communication: none Disposition:   Status is: Inpatient Remains inpatient appropriate because: need for continued inpatient care   Consultants:  ortho  Procedures:  none  Antimicrobials:  Anti-infectives (From admission, onward)    None       Subjective: C/o nausea/vomiting Feels generally uncomfortable today  Objective: Vitals:   07/02/24 0358 07/02/24 0756 07/02/24 0809 07/02/24 1432  BP: (!) 149/68  133/63 (!) 123/59  Pulse: 93 91 93 98  Resp: 19 20 17 16   Temp: 98.2 F (36.8 C)  98.2 F (36.8 C) 98.5 F (36.9 C)  TempSrc: Oral  Oral Oral  SpO2: 91% 95% 94% 95%  Weight:       No intake or output data in the 24 hours ending 07/02/24 1729  Filed Weights   06/30/24 1100  Weight: 61.2 kg    Examination:  General: No acute distress. Cardiovascular: RRR Lungs: unlabored Abdomen: distended, uncomfortable on exam Neurological: Alert and oriented 3. Moves all  extremities 4 with equal strength. Cranial nerves II through XII grossly intact. Extremities: R hip TTP   Data Reviewed: I have personally reviewed following labs and imaging studies  CBC: Recent Labs  Lab 06/29/24 1910 06/30/24 0439 07/01/24 0451 07/02/24 0506  WBC 10.6* 6.1 8.3 9.1  NEUTROABS 9.9*  --  6.8  --   HGB  9.3* 8.8* 8.9* 8.8*  HCT 29.3* 27.3* 27.3* 27.2*  MCV 102.1* 100.0 101.1* 100.0  PLT 304 300 267 254    Basic Metabolic Panel: Recent Labs  Lab 06/29/24 1910 06/29/24 2243 06/30/24 0439 07/01/24 0451 07/02/24 0506  NA 138  --  136 134* 136  K 3.6  --  3.6 4.0 3.4*  CL 99  --  96* 97* 98  CO2 29  --  26 26 26   GLUCOSE 103*  --  118* 122* 121*  BUN 15  --  16 23 22   CREATININE 0.85  --  0.85 0.97 0.76  CALCIUM  9.3  --  8.9 9.3 9.1  MG  --  1.6*  --  1.9  --   PHOS  --  2.7  --   --  4.3    GFR: Estimated Creatinine Clearance: 58.7 mL/min (by C-G formula based on SCr of 0.76 mg/dL).  Liver Function Tests: Recent Labs  Lab 06/30/24 0439 07/01/24 0451  AST 17 18  ALT 11 11  ALKPHOS 70 63  BILITOT 1.6* 1.0  PROT 6.7 6.8  ALBUMIN  3.5 3.3*    CBG: No results for input(s): GLUCAP in the last 168 hours.   No results found for this or any previous visit (from the past 240 hours).       Radiology Studies: No results found.       Scheduled Meds:  allopurinol   300 mg Oral QPM   atorvastatin   80 mg Oral Daily   cyanocobalamin   1,000 mcg Oral Daily   ezetimibe   10 mg Oral Daily   feeding supplement  237 mL Oral TID BM   fluticasone  furoate-vilanterol  1 puff Inhalation Daily   multivitamin with minerals  1 tablet Oral Daily   pantoprazole   40 mg Oral BID   umeclidinium bromide   1 puff Inhalation Daily   verapamil   240 mg Oral Daily   Continuous Infusions:   LOS: 3 days    Time spent: over 30 min     Meliton Monte, MD [facility]   To contact the attending provider between 7A-7P or the covering provider during after hours 7P-7A, please log into the web site [email or web address] and access using universal [place] password for that web site. If you do not have the password, please call the hospital operator.  07/02/2024, 5:29 PM

## 2024-07-03 ENCOUNTER — Inpatient Hospital Stay (HOSPITAL_COMMUNITY)

## 2024-07-03 DIAGNOSIS — S72301A Unspecified fracture of shaft of right femur, initial encounter for closed fracture: Secondary | ICD-10-CM | POA: Diagnosis not present

## 2024-07-03 LAB — COMPREHENSIVE METABOLIC PANEL WITH GFR
ALT: 11 U/L (ref 0–44)
AST: 17 U/L (ref 15–41)
Albumin: 3 g/dL — ABNORMAL LOW (ref 3.5–5.0)
Alkaline Phosphatase: 58 U/L (ref 38–126)
Anion gap: 11 (ref 5–15)
BUN: 22 mg/dL (ref 8–23)
CO2: 26 mmol/L (ref 22–32)
Calcium: 8.9 mg/dL (ref 8.9–10.3)
Chloride: 98 mmol/L (ref 98–111)
Creatinine, Ser: 0.76 mg/dL (ref 0.44–1.00)
GFR, Estimated: >60 mL/min (ref >60)
Glucose, Bld: 118 mg/dL — ABNORMAL HIGH (ref 70–99)
Potassium: 3.6 mmol/L (ref 3.5–5.1)
Sodium: 135 mmol/L (ref 135–145)
Total Bilirubin: 1.1 mg/dL (ref 0.0–1.2)
Total Protein: 6.8 g/dL (ref 6.5–8.1)

## 2024-07-03 LAB — CBC WITH DIFFERENTIAL/PLATELET
Abs Immature Granulocytes: 0.2 K/uL — ABNORMAL HIGH (ref 0.00–0.07)
Basophils Absolute: 0 K/uL (ref 0.0–0.1)
Basophils Relative: 0 %
Eosinophils Absolute: 0 K/uL (ref 0.0–0.5)
Eosinophils Relative: 0 %
HCT: 27.1 % — ABNORMAL LOW (ref 36.0–46.0)
Hemoglobin: 8.8 g/dL — ABNORMAL LOW (ref 12.0–15.0)
Immature Granulocytes: 2 %
Lymphocytes Relative: 5 %
Lymphs Abs: 0.5 K/uL — ABNORMAL LOW (ref 0.7–4.0)
MCH: 32.7 pg (ref 26.0–34.0)
MCHC: 32.5 g/dL (ref 30.0–36.0)
MCV: 100.7 fL — ABNORMAL HIGH (ref 80.0–100.0)
Monocytes Absolute: 0.7 K/uL (ref 0.1–1.0)
Monocytes Relative: 8 %
Neutro Abs: 6.9 K/uL (ref 1.7–7.7)
Neutrophils Relative %: 85 %
Platelets: 248 K/uL (ref 150–400)
RBC: 2.69 MIL/uL — ABNORMAL LOW (ref 3.87–5.11)
RDW: 24 % — ABNORMAL HIGH (ref 11.5–15.5)
WBC: 8.3 K/uL (ref 4.0–10.5)
nRBC: 2.3 % — ABNORMAL HIGH (ref 0.0–0.2)

## 2024-07-03 LAB — PHOSPHORUS: Phosphorus: 3.7 mg/dL (ref 2.5–4.6)

## 2024-07-03 LAB — MAGNESIUM: Magnesium: 2 mg/dL (ref 1.7–2.4)

## 2024-07-03 MED ORDER — BISACODYL 10 MG RE SUPP: 10.0000 mg | Freq: Every day | RECTAL | Status: DC | PRN

## 2024-07-03 MED ORDER — BISACODYL 10 MG RE SUPP
10.0000 mg | Freq: Once | RECTAL | Status: AC
  Administered 2024-07-03: 10 mg via RECTAL
  Filled 2024-07-03: qty 1

## 2024-07-03 MED ORDER — IOHEXOL 350 MG/ML SOLN
75.0000 mL | Freq: Once | INTRAVENOUS | Status: AC | PRN
  Administered 2024-07-03: 75 mL via INTRAVENOUS

## 2024-07-03 MED ORDER — ONDANSETRON HCL 4 MG/2ML IJ SOLN
4.0000 mg | Freq: Four times a day (QID) | INTRAMUSCULAR | Status: AC | PRN
  Administered 2024-07-03 – 2024-07-05 (×2): 4 mg via INTRAVENOUS
  Filled 2024-07-03: qty 2

## 2024-07-03 NOTE — Progress Notes (Signed)
 PROGRESS NOTE    Marie Johnson  FMW:[id number] DOB: [birth year] DOA: 06/29/2024 PCP: Stephanie Charlene CROME, MD  Chief Complaint  Patient presents with   Fall    Brief Narrative:   Marie Johnson is Marie Johnson 73 y.o. female with medical history significant of COPD on chronic 2 L CAD, PAD, HTN ,HLD, bilateral hip replacements presented with mechanical fall after she tripped on her oxygen  tubing resulting in right hip pain. Denies head trauma, no LOC.  In the ED, vital signs fairly stable, saturating well on 2 L of O2 (at baseline), labs noted with WBC 10.6, hemoglobin 9.3, otherwise unremarkable.  Orthopedics consulted.  Admitted for further management.  Assessment & Plan:   Principal Problem:   Closed right hip fracture (HCC) Active Problems:   Coronary artery disease involving native coronary artery of native heart   COPD (chronic obstructive pulmonary disease) (HCC)   Hypertension   Chronic diastolic CHF (congestive heart failure) (HCC)   Tobacco abuse   Hyperlipidemia   Malnutrition of moderate degree   Closed fracture of shaft of right femur (HCC)  Acute periprosthetic fracture of the proximal right femur  Mechanical fall CT showed periprosthetic fracture of proximal R femur, ill defined subcutaneous hematoma posterolaterally Orthopedics consulted  Pain management - will increase frequency and dose of oxycodone  given her chronic use at home.  Scheduled APAP.  Robaxin. Fall precautions   Abdominal Distension Colonic Ileus  KUB 10/16 with diffuse bowel gas distension throughout the abdomen - concerning for colonic ileus Will follow CT scan today Last BM 10/13 - passing some gas this AM, notes mild improvement in discomfort Hx diastasis recti  Bowel regimen  Chronic hypoxic respiratory failure COPD Appears stable at baseline, patient is on 2 L of O2 Stable    Normocytic anemia Anemia of chronic disease Vitamin B12 deficiency Baseline hemoglobin around 10-11 No evidence of  significant bleeding, although CT of hip showed some ill-defined subcutaneous hematoma Anemia panel WNL, except for vitamin B12--> 371 Vitamin B12 supplementation Trend CBC, monitor closely   Hypomagnesemia Replaced as needed   CAD  PAD Hyperlipidemia  s/p LHC 03/2023 with mild - moderate non obstructive disease - recommended ASA daily at that time Apparently recently stopped plavix  2 weeks ago?  Seems like indication for DAPT was PAD (09/2022 vascular note recommends DAPT) Hold aspirin  for now Continue Lipitor    Chronic diastolic HF Currently appears euvolemic Repeat echo with EF 60 to 65%, no regional wall motion abnormality Hold home Lasix , Aldactone  for now, continue with verapamil  and monitor closely   Hypertension BP ok today Lasix  and aldactone  on hold Verapamil    Gout allopurinol      DVT prophylaxis: SCD Code Status: full Family Communication: none Disposition:   Status is: Inpatient Remains inpatient appropriate because: need for continued inpatient care   Consultants:  ortho  Procedures:  none  Antimicrobials:  Anti-infectives (From admission, onward)    None       Subjective: C/o nausea/vomiting Feels generally uncomfortable today  Objective: Vitals:   07/02/24 2041 07/03/24 0034 07/03/24 0445 07/03/24 0800  BP: (!) 132/58  134/70 (!) 152/69  Pulse: 83 84 81 90  Resp: 16  16 18   Temp: 98.1 F (36.7 C)  97.9 F (36.6 C) 98.2 F (36.8 C)  TempSrc:    Oral  SpO2: 92% 92% 90% (!) 89%  Weight:        Intake/Output Summary (Last 24 hours) at 07/03/2024 0902 Last data filed at 07/03/2024 0600 Gross  per 24 hour  Intake 440 ml  Output 500 ml  Net -60 ml    Filed Weights   06/30/24 1100  Weight: 61.2 kg    Examination:  General: No acute distress. Cardiovascular: RRR Lungs: unlabored Abdomen: tight and distended Neurological: Alert and oriented 3. Moves all extremities 4 with equal strength. Cranial nerves II through XII  grossly intact. Extremities: No clubbing or cyanosis. No edema.   Data Reviewed: I have personally reviewed following labs and imaging studies  CBC: Recent Labs  Lab 06/29/24 1910 06/30/24 0439 07/01/24 0451 07/02/24 0506 07/03/24 0501  WBC 10.6* 6.1 8.3 9.1 8.3  NEUTROABS 9.9*  --  6.8  --  6.9  HGB 9.3* 8.8* 8.9* 8.8* 8.8*  HCT 29.3* 27.3* 27.3* 27.2* 27.1*  MCV 102.1* 100.0 101.1* 100.0 100.7*  PLT 304 300 267 254 248    Basic Metabolic Panel: Recent Labs  Lab 06/29/24 1910 06/29/24 2243 06/30/24 0439 07/01/24 0451 07/02/24 0506 07/03/24 0501  NA 138  --  136 134* 136 135  K 3.6  --  3.6 4.0 3.4* 3.6  CL 99  --  96* 97* 98 98  CO2 29  --  26 26 26 26   GLUCOSE 103*  --  118* 122* 121* 118*  BUN 15  --  16 23 22 22   CREATININE 0.85  --  0.85 0.97 0.76 0.76  CALCIUM  9.3  --  8.9 9.3 9.1 8.9  MG  --  1.6*  --  1.9  --  2.0  PHOS  --  2.7  --   --  4.3 3.7    GFR: Estimated Creatinine Clearance: 58.7 mL/min (by C-G formula based on SCr of 0.76 mg/dL).  Liver Function Tests: Recent Labs  Lab 06/30/24 0439 07/01/24 0451 07/03/24 0501  AST 17 18 17   ALT 11 11 11   ALKPHOS 70 63 58  BILITOT 1.6* 1.0 1.1  PROT 6.7 6.8 6.8  ALBUMIN  3.5 3.3* 3.0*    CBG: No results for input(s): GLUCAP in the last 168 hours.   No results found for this or any previous visit (from the past 240 hours).       Radiology Studies: DG Abd 1 View Result Date: 07/02/2024 CLINICAL DATA:  Abdominal distension. EXAM: ABDOMEN - 1 VIEW COMPARISON:  06/11/2023 FINDINGS: Supine images of the abdomen demonstrate diffuse bowel gas distension. There is clearly gas within the colon and rectum but difficult to exclude small bowel gas. Diffuse atherosclerotic calcifications. Bilateral hip replacements. Vascular stent in the left proximal thigh. Vascular stents in the region of the common iliac arteries. IMPRESSION: 1. Diffuse bowel gas distension throughout the abdomen. The bowel gas appears  to be predominantly within the colon and findings could be associated with Cambria Osten colonic ileus. Electronically Signed   By: Juliene Balder M.D.   On: 07/02/2024 20:25         Scheduled Meds:  acetaminophen   1,000 mg Oral Q8H   allopurinol   300 mg Oral QPM   atorvastatin   80 mg Oral Daily   cyanocobalamin   1,000 mcg Oral Daily   ezetimibe   10 mg Oral Daily   feeding supplement  237 mL Oral TID BM   fluticasone  furoate-vilanterol  1 puff Inhalation Daily   multivitamin with minerals  1 tablet Oral Daily   pantoprazole   40 mg Oral BID   polyethylene glycol  17 g Oral BID   umeclidinium bromide   1 puff Inhalation Daily   verapamil   240 mg  Oral Daily   Continuous Infusions:   LOS: 4 days    Time spent: over 30 min     Meliton Monte, MD [facility]   To contact the attending provider between 7A-7P or the covering provider during after hours 7P-7A, please log into the web site [email or web address] and access using universal [place] password for that web site. If you do not have the password, please call the hospital operator.  07/03/2024, 9:02 AM

## 2024-07-03 NOTE — Progress Notes (Signed)
 Informed Lavanda Horns, NP to review DG abdomen result done at 1753.

## 2024-07-03 NOTE — Plan of Care (Signed)
  Problem: Skin Integrity: Goal: Risk for impaired skin integrity will decrease Outcome: Not Progressing   Problem: Safety: Goal: Ability to remain free from injury will improve Outcome: Not Progressing   Problem: Pain Managment: Goal: General experience of comfort will improve and/or be controlled Outcome: Not Progressing   Problem: Elimination: Goal: Will not experience complications related to bowel motility Outcome: Not Progressing   Problem: Activity: Goal: Risk for activity intolerance will decrease Outcome: Not Progressing

## 2024-07-03 NOTE — Progress Notes (Signed)
 Abdomen is more distended at the moment, noticeable elevation at the middle abdomen, per patient she doesn't have history of hernia. Bowel sounds are hypoactive.

## 2024-07-03 NOTE — Progress Notes (Signed)
 PT Cancellation Note  Patient Details Name: Marie Johnson MRN: [id number] DOB: May 18, 1951   Cancelled Treatment:    Reason Eval/Treat Not Completed: (P) Pain limiting ability to participate, pt politely declining mobility due to pain, despite pre-medication and encouragement. Will check back as schedule allows to continue with PT POC.  Therisa SAUNDERS. PTA Acute Rehabilitation Services Office: [phone]   Therisa CHRISTELLA Boor 07/03/2024, 4:24 PM

## 2024-07-04 DIAGNOSIS — S72301A Unspecified fracture of shaft of right femur, initial encounter for closed fracture: Secondary | ICD-10-CM | POA: Diagnosis not present

## 2024-07-04 LAB — CBC WITH DIFFERENTIAL/PLATELET
Abs Immature Granulocytes: 0.27 K/uL — ABNORMAL HIGH (ref 0.00–0.07)
Basophils Absolute: 0 K/uL (ref 0.0–0.1)
Basophils Relative: 0 %
Eosinophils Absolute: 0 K/uL (ref 0.0–0.5)
Eosinophils Relative: 0 %
HCT: 27.3 % — ABNORMAL LOW (ref 36.0–46.0)
Hemoglobin: 8.9 g/dL — ABNORMAL LOW (ref 12.0–15.0)
Immature Granulocytes: 3 %
Lymphocytes Relative: 4 %
Lymphs Abs: 0.4 K/uL — ABNORMAL LOW (ref 0.7–4.0)
MCH: 33 pg (ref 26.0–34.0)
MCHC: 32.6 g/dL (ref 30.0–36.0)
MCV: 101.1 fL — ABNORMAL HIGH (ref 80.0–100.0)
Monocytes Absolute: 0.8 K/uL (ref 0.1–1.0)
Monocytes Relative: 8 %
Neutro Abs: 7.9 K/uL — ABNORMAL HIGH (ref 1.7–7.7)
Neutrophils Relative %: 85 %
Platelets: 294 K/uL (ref 150–400)
RBC: 2.7 MIL/uL — ABNORMAL LOW (ref 3.87–5.11)
RDW: 24.2 % — ABNORMAL HIGH (ref 11.5–15.5)
Smear Review: NORMAL
WBC: 9.3 K/uL (ref 4.0–10.5)
nRBC: 2 % — ABNORMAL HIGH (ref 0.0–0.2)

## 2024-07-04 LAB — COMPREHENSIVE METABOLIC PANEL WITH GFR
ALT: 12 U/L (ref 0–44)
AST: 19 U/L (ref 15–41)
Albumin: 2.9 g/dL — ABNORMAL LOW (ref 3.5–5.0)
Alkaline Phosphatase: 58 U/L (ref 38–126)
Anion gap: 13 (ref 5–15)
BUN: 30 mg/dL — ABNORMAL HIGH (ref 8–23)
CO2: 25 mmol/L (ref 22–32)
Calcium: 8.9 mg/dL (ref 8.9–10.3)
Chloride: 95 mmol/L — ABNORMAL LOW (ref 98–111)
Creatinine, Ser: 0.92 mg/dL (ref 0.44–1.00)
GFR, Estimated: >60 mL/min (ref >60)
Glucose, Bld: 88 mg/dL (ref 70–99)
Potassium: 3.9 mmol/L (ref 3.5–5.1)
Sodium: 133 mmol/L — ABNORMAL LOW (ref 135–145)
Total Bilirubin: 1.4 mg/dL — ABNORMAL HIGH (ref 0.0–1.2)
Total Protein: 6.7 g/dL (ref 6.5–8.1)

## 2024-07-04 LAB — MAGNESIUM: Magnesium: 2.2 mg/dL (ref 1.7–2.4)

## 2024-07-04 LAB — PHOSPHORUS: Phosphorus: 5.3 mg/dL — ABNORMAL HIGH (ref 2.5–4.6)

## 2024-07-04 MED ORDER — BISACODYL 10 MG RE SUPP
10.0000 mg | Freq: Two times a day (BID) | RECTAL | Status: DC
  Administered 2024-07-04 – 2024-07-16 (×8): 10 mg via RECTAL
  Filled 2024-07-04 (×17): qty 1

## 2024-07-04 MED ORDER — LACTULOSE 10 GM/15ML PO SOLN
30.0000 g | Freq: Three times a day (TID) | ORAL | Status: DC
  Administered 2024-07-04 – 2024-07-12 (×9): 30 g via ORAL
  Filled 2024-07-04 (×16): qty 45

## 2024-07-04 MED ORDER — LACTULOSE 10 GM/15ML PO SOLN: 20.0000 g | Freq: Two times a day (BID) | ORAL | Status: DC | PRN

## 2024-07-04 NOTE — Plan of Care (Signed)
  Problem: Education: Goal: Knowledge of General Education information will improve Description: Including pain rating scale, medication(s)/side effects and non-pharmacologic comfort measures Outcome: Progressing   Problem: Clinical Measurements: Goal: Ability to maintain clinical measurements within normal limits will improve Outcome: Progressing   Problem: Pain Managment: Goal: General experience of comfort will improve and/or be controlled Outcome: Progressing   Problem: Safety: Goal: Ability to remain free from injury will improve Outcome: Progressing   Problem: Activity: Goal: Risk for activity intolerance will decrease Outcome: Not Progressing   Problem: Elimination: Goal: Will not experience complications related to bowel motility Outcome: Not Progressing

## 2024-07-04 NOTE — Plan of Care (Signed)

## 2024-07-04 NOTE — Progress Notes (Signed)
 PROGRESS NOTE    Marie Johnson  FMW:[id number] DOB: Jan 25, 1951 DOA: 06/29/2024 PCP: Stephanie Charlene CROME, MD  Chief Complaint  Patient presents with   Fall    Brief Narrative:   Marie Johnson is Marie Johnson 73 y.o. female with medical history significant of COPD on chronic 2 L CAD, PAD, HTN ,HLD, bilateral hip replacements presented with mechanical fall after she tripped on her oxygen  tubing resulting in right hip pain. Denies head trauma, no LOC.  In the ED, vital signs fairly stable, saturating well on 2 L of O2 (at baseline), labs noted with WBC 10.6, hemoglobin 9.3, otherwise unremarkable.  Orthopedics consulted.  Admitted for further management.  Assessment & Plan:   Principal Problem:   Closed right hip fracture (HCC) Active Problems:   Coronary artery disease involving native coronary artery of native heart   COPD (chronic obstructive pulmonary disease) (HCC)   Hypertension   Chronic diastolic CHF (congestive heart failure) (HCC)   Tobacco abuse   Hyperlipidemia   Malnutrition of moderate degree   Closed fracture of shaft of right femur (HCC)  Acute periprosthetic fracture of the proximal right femur  Mechanical fall CT showed periprosthetic fracture of proximal R femur, ill defined subcutaneous hematoma posterolaterally Orthopedics consulted  Pain management - will increase frequency and dose of oxycodone  given her chronic use at home.  Scheduled APAP.  Robaxin. Fall precautions   Abdominal Distension Colonic Ileus  KUB 10/16 with diffuse bowel gas distension throughout the abdomen - concerning for colonic ileus CT with gaseous distention of colon without discrete focal transition point or mass lesion Appreciate GI assistance - aggressive bowel regimen  Hx diastasis recti  Bowel regimen  Chronic hypoxic respiratory failure COPD Appears stable at baseline, patient is on 2 L of O2 Stable    Normocytic anemia Anemia of chronic disease Vitamin B12 deficiency Baseline  hemoglobin around 10-11 No evidence of significant bleeding, although CT of hip showed some ill-defined subcutaneous hematoma Anemia panel WNL, except for vitamin B12--> 371 Vitamin B12 supplementation Trend CBC, monitor closely   Hypomagnesemia Replaced as needed   CAD  PAD Hyperlipidemia  s/p LHC 03/2023 with mild - moderate non obstructive disease - recommended ASA daily at that time Apparently recently stopped plavix  2 weeks ago?  Seems like indication for DAPT was PAD (09/2022 vascular note recommends DAPT) Hold aspirin  for now Continue Lipitor    Chronic diastolic HF Currently appears euvolemic Repeat echo with EF 60 to 65%, no regional wall motion abnormality Hold home Lasix , Aldactone  for now, continue with verapamil  and monitor closely   Hypertension BP ok today Lasix  and aldactone  on hold Verapamil    Gout allopurinol    Abnormal CT Scan Findings concerning for organizing pneumonia in R lung base Nodular thickening of adrenal glands Needs follow up imaging / outpatient follow up       DVT prophylaxis: SCD Code Status: full Family Communication: none Disposition:   Status is: Inpatient Remains inpatient appropriate because: need for continued inpatient care   Consultants:  ortho  Procedures:  none  Antimicrobials:  Anti-infectives (From admission, onward)    None       Subjective: C/o R hip pain   Objective: Vitals:   07/03/24 2024 07/04/24 0437 07/04/24 0855 07/04/24 1511  BP: (!) 144/70 (!) 132/54 (!) 138/52 (!) 125/54  Pulse: 98 89 83 85  Resp: 18 16 16 16   Temp: 98.2 F (36.8 C) 97.8 F (36.6 C) (!) 97.5 F (36.4 C)   TempSrc: Oral  SpO2: 92% 91% 91% 96%  Weight:      Height:        Intake/Output Summary (Last 24 hours) at 07/04/2024 1643 Last data filed at 07/04/2024 1500 Gross per 24 hour  Intake 240 ml  Output 400 ml  Net -160 ml    Filed Weights   06/30/24 1100 07/03/24 2004  Weight: 61.2 kg 61.2 kg     Examination:  General: No acute distress. Cardiovascular: RRR Lungs: unlabored Abdomen:distended, tight Neurological: Alert and oriented 3. Moves all extremities 4 with equal strength. Cranial nerves II through XII grossly intact. Skin: Warm and dry. No rashes or lesions. Extremities: No clubbing or cyanosis. No edema.  Data Reviewed: I have personally reviewed following labs and imaging studies  CBC: Recent Labs  Lab 06/29/24 1910 06/30/24 0439 07/01/24 0451 07/02/24 0506 07/03/24 0501 07/04/24 0556  WBC 10.6* 6.1 8.3 9.1 8.3 9.3  NEUTROABS 9.9*  --  6.8  --  6.9 7.9*  HGB 9.3* 8.8* 8.9* 8.8* 8.8* 8.9*  HCT 29.3* 27.3* 27.3* 27.2* 27.1* 27.3*  MCV 102.1* 100.0 101.1* 100.0 100.7* 101.1*  PLT 304 300 267 254 248 294    Basic Metabolic Panel: Recent Labs  Lab 06/29/24 2243 06/30/24 0439 07/01/24 0451 07/02/24 0506 07/03/24 0501 07/04/24 0556  NA  --  136 134* 136 135 133*  K  --  3.6 4.0 3.4* 3.6 3.9  CL  --  96* 97* 98 98 95*  CO2  --  26 26 26 26 25   GLUCOSE  --  118* 122* 121* 118* 88  BUN  --  16 23 22 22  30*  CREATININE  --  0.85 0.97 0.76 0.76 0.92  CALCIUM   --  8.9 9.3 9.1 8.9 8.9  MG 1.6*  --  1.9  --  2.0 2.2  PHOS 2.7  --   --  4.3 3.7 5.3*    GFR: Estimated Creatinine Clearance: 51 mL/min (by C-G formula based on SCr of 0.92 mg/dL).  Liver Function Tests: Recent Labs  Lab 06/30/24 0439 07/01/24 0451 07/03/24 0501 07/04/24 0556  AST 17 18 17 19   ALT 11 11 11 12   ALKPHOS 70 63 58 58  BILITOT 1.6* 1.0 1.1 1.4*  PROT 6.7 6.8 6.8 6.7  ALBUMIN  3.5 3.3* 3.0* 2.9*    CBG: No results for input(s): GLUCAP in the last 168 hours.   No results found for this or any previous visit (from the past 240 hours).       Radiology Studies: CT ABDOMEN PELVIS W CONTRAST Result Date: 07/03/2024 CLINICAL DATA:  Acute abdominal pain, history of glottic carcinoma. EXAM: CT ABDOMEN AND PELVIS WITH CONTRAST TECHNIQUE: Multidetector CT imaging of  the abdomen and pelvis was performed using the standard protocol following bolus administration of intravenous contrast. RADIATION DOSE REDUCTION: This exam was performed according to the departmental dose-optimization program which includes automated exposure control, adjustment of the mA and/or kV according to patient size and/or use of iterative reconstruction technique. CONTRAST:  75mL OMNIPAQUE  IOHEXOL  350 MG/ML SOLN COMPARISON:  Abdomen radiograph of July 02, 2024 FINDINGS: Lower chest: Right lung base ground-glass attenuation surrounding by solid components (atoll sign) suggestive of infectious/inflammatory process (cryptogenic organizing pneumonia). Bibasilar atelectasis. Right-sided small pleural effusion. Additional scattered pulmonary nodules up to 4 mm within the field of view for example left lower lobe 5/1). Follow-up according to oncologic protocols. Bronchial wall thickening and bronchiectasis. Cardiomegaly. Atherosclerotic calcifications of coronary arteries. Trace pericardial fluid. Hepatobiliary: Hypodense subcentimeter liver lesions  likely representing cysts. Focal dilation of left hepatic duct in central liver. Gallbladder is unremarkable. Common bile duct is dilated up to 7 mm at the pancreatic head. Pancreas: Pancreatic duct is dilated up to 5.6 mm at the head and neck region distal to the neck pancreas appears atrophic with normal diameter of the duct. Scattered calcifications suggestive of prior Spleen: Scattered calcifications suggestive of granulomatous disease including sarcoidosis. No splenomegaly. Adrenals/Urinary Tract: Thickened nodular bilateral adrenal glands left-greater-than-right. Atrophic changes of the renal cortices. Multiple renal cortical cysts which does not require imaging follow-up. No hydronephrosis. Stomach/Bowel: There is severe gaseous distention of the colon with Marie Johnson short-segment narrowing without discrete mass lesion in the sigmoid region. Moderate amount of stool  is identified within the rectosigmoid colon. Small bowel loops are nondilated. Appendix is slightly prominent measuring up to 6.7 mm. No periappendiceal fat stranding or fluid collection. Vascular/Lymphatic: Severe atherosclerotic calcifications of aorta. 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 Reproductive: Hysterectomy. Limited evaluation of the bilateral adnexa due to streak artifact overlying the pelvic cavity. Other: No abdominal wall hernia or abnormality. No abdominopelvic ascites. Musculoskeletal: Artifact from bilateral hip arthroplasty decreased pelvic images. Partially visualized periprosthetic fracture of the right proximal femur IMPRESSION: Gaseous distention of the colon without discrete focal transition point or mass lesion likely representing ileus. Bilateral hip arthroplasty status post periprosthetic fracture of the right femur. Suggestion of organizing pneumonia in right lung base, most commonly cryptogenic (COP). Additional pulmonary nodules. Recommend follow-up according to oncologic protocols to ensure resolution. 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 Nodular thickening of adrenal glands. Metastasis can not be excluded in this patient with known malignancy. Correlate with PET-CT findings. Electronically Signed   By: Marie  Johnson M.D.   On: 07/03/2024 14:25   DG Abd 1 View Result Date: 07/02/2024 CLINICAL DATA:  Abdominal distension. EXAM: ABDOMEN - 1 VIEW COMPARISON:  06/11/2023 FINDINGS: Supine images of the abdomen demonstrate diffuse bowel gas distension. There is clearly gas within the colon and rectum but difficult to exclude small bowel gas. Diffuse atherosclerotic calcifications. Bilateral hip replacements. Vascular stent in the left proximal thigh. Vascular stents in the region of the common iliac arteries. IMPRESSION: 1. Diffuse bowel gas distension throughout the abdomen. The bowel gas appears to be predominantly within  the colon and findings could be associated with Rakesha Dalporto colonic ileus. Electronically Signed   By: Juliene Balder M.D.   On: 07/02/2024 20:25         Scheduled Meds:  acetaminophen   1,000 mg Oral Q8H   allopurinol   300 mg Oral QPM   bisacodyl   10 mg Rectal BID   feeding supplement  237 mL Oral TID BM   fluticasone  furoate-vilanterol  1 puff Inhalation Daily   lactulose   30 g Oral TID   multivitamin with minerals  1 tablet Oral Daily   pantoprazole   40 mg Oral BID   polyethylene glycol  17 g Oral BID   umeclidinium bromide   1 puff Inhalation Daily   verapamil   240 mg Oral Daily   Continuous Infusions:   LOS: 5 days    Time spent: over 30 min     Meliton Monte, MD [facility]   To contact the attending provider between 7A-7P or the covering provider during after hours 7P-7A, please log into the web site [email or web address] and access using universal [place] password for that web site. If you do not have the password, please call the hospital  operator.  07/04/2024, 4:43 PM

## 2024-07-04 NOTE — Consult Note (Signed)
 [facility] Consult  Referring Provider: No ref. provider found Primary Care Physician:  Stephanie Charlene CROME, MD Primary Gastroenterologist: Dr. Rosalie  Reason for Consultation:  Abdominal distension  HPI: Marie Johnson is a 73 y.o. female was admitted on 06/29/2024 after a mechanical fall resulting in closed right hip fracture with plans for nonoperative management and pain management.  Patient complains of constipation and reports she has not had a bowel movement in over 4 to 5 days. She reports having an abdominal hernia, has history of chronic constipation and reports that over-the-counter medications such as MiraLAX , Metamucil, Senokot, Dulcolax, Ex-Lax have been tried in the past without any results. She has been on lactulose  which has helped her the best but has not been on lactulose  recently. She is passing some gas. She had some mild nausea yesterday but denies vomiting and is tolerating clear liquids.   Previous GI workup: Colonoscopy, Dr. Rosalie, 3/21: 7 tubular adenomas were removed (descending, transverse, cecum, ascending), melanosis coli noted, internal and external hemorrhoids, diverticulosis in ascending colon  Colonoscopy 06/2014: Tubular adenoma removed from sigmoid, diverticulosis in sigmoid and descending, internal and external hemorrhoids  EGD 07/2014, epigastric abdominal pain abnormal CAT scan: Small hiatal hernia, chronic inactive gastritis, no H. pylori or metaplasia, normal duodenum  Past Medical History:  Diagnosis Date   AAA (abdominal aortic aneurysm)    3.4 cm   Anemia    Anxiety    Aortic atherosclerosis    Arthritis    Asthma    CAD (coronary artery disease)    a. Nonobstructive CAD by cath 07/17/13.   Cancer [facility])    Carotid artery disease    Carotid US  8/22: Bilateral ICA 1-39; right subclavian stenosis; right thyroid  nodule   CHD (congenital heart disease)    pt unaware???   Chest pain    a. Adm 10-07/2013 - CTA neg for  PE/dissection, cath with nonobstructive disease, CP ?due to uncontrolled HTN vs coronary vasospasm.   CHF (congestive heart failure) (HCC)    Closed head injury with concussion    COPD (chronic obstructive pulmonary disease) (HCC)    Coronary artery disease    Echocardiogram    Echocardiogram 06/2019: EF 60-65, impaired relaxation (Gr 1 DD), mild MAC, mild MR, mild to mod aortic valve sclerosis (no AS), trivial TR, mild LAE, normal RVSF, RVSP 30.7 (mildly elevated)   GERD (gastroesophageal reflux disease)    History of blood transfusion    childbirth, Hysterectomy   Hyperlipidemia    Hypertension    Neuropathy    OA (osteoarthritis)    Osteoporosis    PAD (peripheral artery disease)    a. s/p L fem-pop bypass b. recent evaluation at [facility], unamenable to intervention   PAD (peripheral artery disease)    Palpitations 02/22/2016   Pneumonia    Pre-diabetes    Premature ventricular contraction    PUD (peptic ulcer disease)    PVD (peripheral vascular disease) 07/17/2013   Thoracic aortic aneurysm    Chest/aorta CTA 04/2022: 14 x 8 focal saccular aneurysm versus penetrating atherosclerotic ulcer involving left side of proximal portion of transverse aortic arch; descending thoracic aorta and proximal abdominal aorta aneurysm 4.4 cm; aortic atherosclerosis; emphysema   Tobacco abuse    Wears dentures    Wears glasses     Past Surgical History:  Procedure Laterality Date   ABDOMINAL AORTOGRAM W/LOWER EXTREMITY N/A 03/09/2020   Procedure: ABDOMINAL AORTOGRAM W/LOWER EXTREMITY;  Surgeon: Gretta Lonni PARAS, MD;  Location: [facility] INVASIVE CV LAB;  Service: Cardiovascular;  Laterality: N/A;   ABDOMINAL AORTOGRAM W/LOWER EXTREMITY N/A 07/26/2022   Procedure: ABDOMINAL AORTOGRAM W/LOWER EXTREMITY;  Surgeon: Gretta Lonni PARAS, MD;  Location: [facility] CV [facility];  Service: Cardiovascular;  Laterality: N/A;   ABDOMINAL AORTOGRAM W/LOWER EXTREMITY N/A 09/20/2022   Procedure: ABDOMINAL AORTOGRAM W/LOWER  EXTREMITY;  Surgeon: Gretta Lonni PARAS, MD;  Location: [facility]VAS[facility] CV LAB;  Service: Cardiovascular;  Laterality: N/A;   ABDOMINAL HYSTERECTOMY     BREAST SURGERY     left cyst excision x 2   CARDIAC CATHETERIZATION     several years ago, nonobstructive, 50-60% blockages   DIRECT LARYNGOSCOPY Bilateral 03/13/2023   Procedure: MICRO DIRECT LARYNGOSCOPY WITH BIOPSY;  Surgeon: Carlie Clark, MD;  Location: [facility] OR;  Service: ENT;  Laterality: Bilateral;   ENDARTERECTOMY FEMORAL Right 03/18/2020   Procedure: ENDARTERECTOMY FEMORAL WITH PROFUNDAPLASTY;  Surgeon: Gretta Lonni PARAS, MD;  Location: [facility] OR;  Service: Vascular;  Laterality: Right;   FEMORAL-POPLITEAL BYPASS GRAFT Right 03/18/2020   Procedure: BYPASS GRAFT FEMORAL-POPLITEAL ARTERY using GORE PROPATEN VASCULAR GRAFT;  Surgeon: Gretta Lonni PARAS, MD;  Location: [facility] OR;  Service: Vascular;  Laterality: Right;   HIP SURGERY     left - bars and screws placed   HIP SURGERY     INSERTION OF ILIAC STENT Right 03/18/2020   Procedure: INSERTION OF GORE VIABAHN  ILIAC STENT;  Surgeon: Gretta Lonni PARAS, MD;  Location: [facility] OR;  Service: Vascular;  Laterality: Right;   LEFT HEART CATH AND CORONARY ANGIOGRAPHY N/A 04/08/2023   Procedure: LEFT HEART CATH AND CORONARY ANGIOGRAPHY;  Surgeon: Anner Alm ORN, MD;  Location: [facility] INVASIVE CV LAB;  Service: Cardiovascular;  Laterality: N/A;   LEFT HEART CATHETERIZATION WITH CORONARY ANGIOGRAM N/A 07/17/2013   Procedure: LEFT HEART CATHETERIZATION WITH CORONARY ANGIOGRAM;  Surgeon: Ezra GORMAN Shuck, MD;  Location: [facility] CATH LAB;  Service: Cardiovascular;  Laterality: N/A;   LOWER EXTREMITY ANGIOGRAM Right 03/18/2020   Procedure: RIGHT ILIAC ARTERIOGRAM WITH AN ILIAC STENT;  Surgeon: Gretta Lonni PARAS, MD;  Location: [facility] OR;  Service: Vascular;  Laterality: Right;   MULTIPLE TOOTH EXTRACTIONS     PATCH ANGIOPLASTY Right 03/18/2020   Procedure: PATCH ANGIOPLASTY using a GEORGE BIOLOGIC PATCH OF THE PROFUNDA;   Surgeon: Gretta Lonni PARAS, MD;  Location: [facility] OR;  Service: Vascular;  Laterality: Right;   PERIPHERAL VASCULAR CATHETERIZATION N/A 07/16/2016   Procedure: Abdominal Aortogram w/Lower Extremity;  Surgeon: Krystal JULIANNA Doing, MD;  Location: [facility] INVASIVE CV LAB;  Service: Cardiovascular;  Laterality: N/A;   PERIPHERAL VASCULAR INTERVENTION Left 03/09/2020   Procedure: PERIPHERAL VASCULAR INTERVENTION;  Surgeon: Gretta Lonni PARAS, MD;  Location: [facility];  Service: Cardiovascular;  Laterality: Left;  common and external illiac    ROTATOR CUFF REPAIR     right side, had torn ligaments as well   TUBAL LIGATION     VEIN BYPASS SURGERY     left leg   WRIST ARTHROCENTESIS     left - broke in 3 places so has bars and screws placed    Prior to Admission medications   Medication Sig Start Date End Date Taking? Authorizing Provider  albuterol  (PROVENTIL  HFA;VENTOLIN  HFA) 108 (90 BASE) MCG/ACT inhaler Inhale 2 puffs into the lungs every 6 (six) hours as needed for wheezing or shortness of breath.   Yes [provider]  alendronate  (FOSAMAX ) 70 MG tablet Take 70 mg by mouth every Wednesday. 02/03/16  Yes [provider]  allopurinol  (ZYLOPRIM ) 300 MG tablet  Take 300 mg by mouth every evening. 08/02/17  Yes [provider]  aspirin  EC 81 MG tablet Take 81 mg by mouth in the morning.   Yes [provider]  atorvastatin  (LIPITOR ) 80 MG tablet Take 80 mg by mouth at bedtime. 02/04/20  Yes [provider]  calcium  carbonate (ANTACID) 500 MG chewable tablet Chew 1 tablet by mouth 2 (two) times daily as needed for heartburn or indigestion.   Yes [provider]  Cholecalciferol (VITAMIN D ) 125 MCG (5000 UT) CAPS Take 5,000 Units by mouth in the morning.   Yes [provider]  cyanocobalamin  (VITAMIN B12) 1000 MCG tablet Take 1,000 mcg by mouth daily.   Yes [provider]  Dupilumab  (DUPIXENT ) 300 MG/2ML SOAJ Inject 300 mg into the skin  every 14 (fourteen) days. 02/28/24  Yes Assaker, Darrin, MD  EPIPEN  2-PAK 0.3 MG/0.3ML SOAJ injection Inject 0.3 mg into the muscle daily as needed for anaphylaxis. Use as directed as needed for allergies. 12/14/15  Yes [provider]  ezetimibe  (ZETIA ) 10 MG tablet Take 1 tablet (10 mg total) by mouth daily. 02/11/24  Yes Nahser, Aleene PARAS, MD  fluticasone -salmeterol (WIXELA INHUB) 500-50 MCG/ACT AEPB Inhale 1 puff into the lungs in the morning and at bedtime. 12/13/23  Yes Assaker, Darrin, MD  furosemide  (LASIX ) 40 MG tablet Take one tablet by mouth twice daily on Mondays, Wednesdays, and Fridays, then take one tablet by mouth daily on other days. 10/21/23  Yes Nahser, Aleene PARAS, MD  Oxycodone  HCl 10 MG TABS Take 1 tablet (10 mg total) by mouth 4 (four) times daily as needed (for pain). 03/22/20  Yes Bethanie Cough, PA-C  pantoprazole  (PROTONIX ) 40 MG tablet Take 40 mg by mouth 2 (two) times daily.   Yes [provider]  potassium chloride  SA (KLOR-CON  M20) 20 MEQ tablet TAKE 1 TABLET BY MOUTH TWICE DAILY ON MONDAYS, WEDNESDAYS, AND FRIDAYS, THEN TAKE ONE TABLET BY MOUTH DAILY ON OTHER DAYS. 11/14/23  Yes Nahser, Aleene PARAS, MD  senna-docusate (SENOKOT-S) 8.6-50 MG tablet Take 2 tablets by mouth at bedtime.   Yes [provider]  SPIRIVA  RESPIMAT 2.5 MCG/ACT AERS Take 2 puffs by mouth daily. 04/03/23  Yes [provider]  spironolactone  (ALDACTONE ) 25 MG tablet Take 1 tablet (25 mg total) by mouth daily. 09/03/17  Yes Maranda Leim DEL, MD  verapamil  (CALAN -SR) 240 MG CR tablet Take 1 tablet (240 mg total) by mouth daily. 02/11/24  Yes Nahser, Aleene PARAS, MD  albuterol  (PROVENTIL ) (2.5 MG/3ML) 0.083% nebulizer solution Take 2.5 mg by nebulization every 6 (six) hours as needed for wheezing or shortness of breath. Patient not taking: Reported on 06/29/2024    [provider]  nitroGLYCERIN  (NITROSTAT ) 0.4 MG SL tablet Place 1 tablet (0.4 mg total) under the  tongue every 5 (five) minutes x 3 doses as needed for chest pain. Patient not taking: Reported on 06/29/2024 04/26/16   Kathrine Jeoffrey POUR, NP    Current Facility-Administered Medications  Medication Dose Route Frequency Provider Last Rate Last Admin   acetaminophen  (TYLENOL ) tablet 1,000 mg  1,000 mg Oral Q8H Perri DELENA Meliton Mickey., MD       Followed by   NOREEN ON 07/09/2024] acetaminophen  (TYLENOL ) tablet 650 mg  650 mg Oral Q6H PRN Perri DELENA Meliton Mickey., MD       albuterol  (PROVENTIL ) (2.5 MG/3ML) 0.083% nebulizer solution 2.5 mg  2.5 mg Nebulization Q2H PRN Ezenduka, Nkeiruka J, MD  allopurinol  (ZYLOPRIM ) tablet 300 mg  300 mg Oral QPM Ezenduka, Nkeiruka J, MD   300 mg at 07/03/24 2040   bisacodyl  (DULCOLAX) suppository 10 mg  10 mg Rectal Daily PRN Perri DELENA Meliton Mickey., MD       feeding supplement (ENSURE PLUS HIGH PROTEIN) liquid 237 mL  237 mL Oral TID BM Ezenduka, Nkeiruka J, MD   237 mL at 07/03/24 1019   fluticasone  furoate-vilanterol (BREO ELLIPTA) 200-25 MCG/ACT 1 puff  1 puff Inhalation Daily Ezenduka, Nkeiruka J, MD   1 puff at 07/02/24 0756   HYDROmorphone  (DILAUDID ) injection 0.5 mg  0.5 mg Intravenous Q2H PRN Doutova, Anastassia, MD   0.5 mg at 07/03/24 1659   melatonin tablet 3 mg  3 mg Oral QHS PRN Doutova, Anastassia, MD   3 mg at 07/03/24 2023   methocarbamol (ROBAXIN) tablet 500 mg  500 mg Oral Q6H PRN Doutova, Anastassia, MD   500 mg at 07/03/24 2023   Or   methocarbamol (ROBAXIN) injection 500 mg  500 mg Intravenous Q6H PRN Doutova, Anastassia, MD       multivitamin with minerals tablet 1 tablet  1 tablet Oral Daily Ezenduka, Nkeiruka J, MD   1 tablet at 07/03/24 1016   ondansetron  (ZOFRAN ) injection 4 mg  4 mg Intravenous Q6H PRN Chavez, Abigail, NP   4 mg at 07/03/24 2040   oxyCODONE  (Oxy IR/ROXICODONE ) immediate release tablet 10 mg  10 mg Oral Q4H PRN Perri DELENA Meliton Mickey., MD       Or   oxyCODONE  (Oxy IR/ROXICODONE ) immediate release tablet 15 mg  15 mg Oral  Q4H PRN Perri DELENA Meliton Mickey., MD   15 mg at 07/04/24 0442   pantoprazole  (PROTONIX ) EC tablet 40 mg  40 mg Oral BID Doutova, Anastassia, MD   40 mg at 07/03/24 2024   polyethylene glycol (MIRALAX  / GLYCOLAX ) packet 17 g  17 g Oral BID Perri DELENA Meliton Mickey., MD   17 g at 07/03/24 2024   simethicone (MYLICON) chewable tablet 80 mg  80 mg Oral Q6H PRN Andrez Chroman, NP   80 mg at 07/03/24 2023   umeclidinium bromide  (INCRUSE ELLIPTA ) 62.5 MCG/ACT 1 puff  1 puff Inhalation Daily Reome, Earle J, RPH   1 puff at 07/02/24 0756   verapamil  (CALAN -SR) CR tablet 240 mg  240 mg Oral Daily Doutova, Anastassia, MD   240 mg at 07/03/24 1017   Facility-Administered Medications Ordered in Other Encounters  Medication Dose Route Frequency Provider Last Rate Last Admin   DOBUTamine  (DOBUTREX ) 1,000 mcg/mL in dextrose  5% 250 mL infusion  30 mcg/kg/min Intravenous Titrated Maranda Leim DEL, MD        Allergies as of 06/29/2024 - Review Complete 06/29/2024  Allergen Reaction Noted   Bee venom Anaphylaxis and Other (See Comments) 07/17/2013   Insect extract Anaphylaxis and Other (See Comments) 03/08/2020   Farxiga  [dapagliflozin ] Nausea And Vomiting 06/11/2022   Lyrica [pregabalin] Nausea And Vomiting 06/11/2022   Neurontin [gabapentin] Nausea And Vomiting and Other (See Comments) 11/09/2021   Nsaids Nausea Only and Other (See Comments) 03/08/2020   Latex Rash and Other (See Comments) 07/12/2016    Family History  Problem Relation Age of Onset   Cervical cancer Mother    Heart disease Mother        MI in 38s   Liver cancer Father    Heart disease Father    Lung cancer Maternal Aunt    Prostate cancer Maternal Uncle  met. anal   Breast cancer Maternal Grandmother    Lung cancer Maternal Aunt    Lung cancer Maternal Aunt    Stomach cancer Maternal Aunt    Colon cancer Maternal Aunt    Lung cancer Maternal Uncle     Social History   Socioeconomic History   Marital status: Married     Spouse name: Not on file   Number of children: Not on file   Years of education: Not on file   Highest education level: Not on file  Occupational History   Not on file  Tobacco Use   Smoking status: Former    Current packs/day: 0.50    Average packs/day: 0.5 packs/day for 57.0 years (28.5 ttl pk-yrs)    Types: Cigarettes    Passive exposure: Current (Spouse is a smoker)   Smokeless tobacco: Never  Vaping Use   Vaping status: Never Used  Substance and Sexual Activity   Alcohol use: Never   Drug use: Never   Sexual activity: Not on file  Other Topics Concern   Not on file  Social History Narrative   ** Merged History Encounter **       Pt lives in [place] with husband.  Retired Child psychotherapist   Social Drivers of Corporate investment banker Strain: Not on [employer] Insecurity: No Food Insecurity (06/29/2024)   Hunger Vital Sign    Worried About Running Out of Food in the Last Year: Never true    Ran Out of Food in the Last Year: Never true  Transportation Needs: No Transportation Needs (06/29/2024)   PRAPARE - Administrator, Civil Service (Medical): No    Lack of Transportation (Non-Medical): No  Physical Activity: Not on file  Stress: Not on file  Social Connections: Moderately Isolated (06/29/2024)   Social Connection and Isolation Panel    Frequency of Communication with Friends and Family: More than three times a week    Frequency of Social Gatherings with Friends and Family: More than three times a week    Attends Religious Services: Never    Database administrator or Organizations: No    Attends Banker Meetings: Never    Marital Status: Married  Catering manager Violence: Not At Risk (06/29/2024)   Humiliation, Afraid, Rape, and Kick questionnaire    Fear of Current or Ex-Partner: No    Emotionally Abused: No    Physically Abused: No    Sexually Abused: No    Review of Systems: As per HPI  Physical Exam: Vital signs in last 24  hours: Temp:  [97.8 F (36.6 C)-98.2 F (36.8 C)] 97.8 F (36.6 C) (10/18 0437) Pulse Rate:  [89-98] 89 (10/18 0437) Resp:  [16-18] 16 (10/18 0437) BP: (132-152)/(54-70) 132/54 (10/18 0437) SpO2:  [89 %-92 %] 91 % (10/18 0437) Weight:  [61.2 kg] 61.2 kg (10/17 2004) Last BM Date : 06/29/24  General:   Alert,  Well-developed, well-nourished, pleasant and cooperative in NAD Head:  Normocephalic and atraumatic. Eyes:  Sclera clear, no icterus.   Conjunctiva pink. Ears:  Normal auditory acuity. Nose:  No deformity, discharge,  or lesions. Mouth:  No deformity or lesions.  Oropharynx pink & moist. Neck:  Supple; no masses or thyromegaly. Lungs: On oxygen  via nasal cannula clear throughout to auscultation.   No wheezes, crackles, or rhonchi. No acute distress. Heart:  Regular rate and rhythm; no murmurs, clicks, rubs,  or gallops. Extremities:  Without clubbing or edema. Neurologic:  Alert and  oriented x4;  grossly normal neurologically. Skin:  Intact without significant lesions or rashes. Psych:  Alert and cooperative. Normal mood and affect. Abdomen: Distended abdomen, not tense, diastasis recti, high-pitched bowel sounds audible         Lab Results: Recent Labs    07/02/24 0506 07/03/24 0501 07/04/24 0556  WBC 9.1 8.3 9.3  HGB 8.8* 8.8* 8.9*  HCT 27.2* 27.1* 27.3*  PLT 254 248 294   BMET Recent Labs    07/02/24 0506 07/03/24 0501 07/04/24 0556  NA 136 135 133*  K 3.4* 3.6 3.9  CL 98 98 95*  CO2 26 26 25   GLUCOSE 121* 118* 88  BUN 22 22 30*  CREATININE 0.76 0.76 0.92  CALCIUM  9.1 8.9 8.9   LFT Recent Labs    07/04/24 0556  PROT 6.7  ALBUMIN  2.9*  AST 19  ALT 12  ALKPHOS 58  BILITOT 1.4*   PT/INR No results for input(s): LABPROT, INR in the last 72 hours.  Studies/Results: CT ABDOMEN PELVIS W CONTRAST Result Date: 07/03/2024 CLINICAL DATA:  Acute abdominal pain, history of glottic carcinoma. EXAM: CT ABDOMEN AND PELVIS WITH CONTRAST TECHNIQUE:  Multidetector CT imaging of the abdomen and pelvis was performed using the standard protocol following bolus administration of intravenous contrast. RADIATION DOSE REDUCTION: This exam was performed according to the departmental dose-optimization program which includes automated exposure control, adjustment of the mA and/or kV according to patient size and/or use of iterative reconstruction technique. CONTRAST:  75mL OMNIPAQUE  IOHEXOL  350 MG/ML SOLN COMPARISON:  Abdomen radiograph of July 02, 2024 FINDINGS: Lower chest: Right lung base ground-glass attenuation surrounding by solid components (atoll sign) suggestive of infectious/inflammatory process (cryptogenic organizing pneumonia). Bibasilar atelectasis. Right-sided small pleural effusion. Additional scattered pulmonary nodules up to 4 mm within the field of view for example left lower lobe 5/1). Follow-up according to oncologic protocols. Bronchial wall thickening and bronchiectasis. Cardiomegaly. Atherosclerotic calcifications of coronary arteries. Trace pericardial fluid. Hepatobiliary: Hypodense subcentimeter liver lesions likely representing cysts. Focal dilation of left hepatic duct in central liver. Gallbladder is unremarkable. Common bile duct is dilated up to 7 mm at the pancreatic head. Pancreas: Pancreatic duct is dilated up to 5.6 mm at the head and neck region distal to the neck pancreas appears atrophic with normal diameter of the duct. Scattered calcifications suggestive of prior Spleen: Scattered calcifications suggestive of granulomatous disease including sarcoidosis. No splenomegaly. Adrenals/Urinary Tract: Thickened nodular bilateral adrenal glands left-greater-than-right. Atrophic changes of the renal cortices. Multiple renal cortical cysts which does not require imaging follow-up. No hydronephrosis. Stomach/Bowel: There is severe gaseous distention of the colon with a short-segment narrowing without discrete mass lesion in the sigmoid  region. Moderate amount of stool is identified within the rectosigmoid colon. Small bowel loops are nondilated. Appendix is slightly prominent measuring up to 6.7 mm. No periappendiceal fat stranding or fluid collection. Vascular/Lymphatic: Severe atherosclerotic calcifications of aorta. 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 Reproductive: Hysterectomy. Limited evaluation of the bilateral adnexa due to streak artifact overlying the pelvic cavity. Other: No abdominal wall hernia or abnormality. No abdominopelvic ascites. Musculoskeletal: Artifact from bilateral hip arthroplasty decreased pelvic images. Partially visualized periprosthetic fracture of the right proximal femur IMPRESSION: Gaseous distention of the colon without discrete focal transition point or mass lesion likely representing ileus. Bilateral hip arthroplasty status post periprosthetic fracture of the right femur. Suggestion of organizing pneumonia in right lung base, most commonly cryptogenic (COP). Additional  pulmonary nodules. Recommend follow-up according to oncologic protocols to ensure resolution. 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 Nodular thickening of adrenal glands. Metastasis can not be excluded in this patient with known malignancy. Correlate with PET-CT findings. Electronically Signed   By: Megan  Zare M.D.   On: 07/03/2024 14:25   DG Abd 1 View Result Date: 07/02/2024 CLINICAL DATA:  Abdominal distension. EXAM: ABDOMEN - 1 VIEW COMPARISON:  06/11/2023 FINDINGS: Supine images of the abdomen demonstrate diffuse bowel gas distension. There is clearly gas within the colon and rectum but difficult to exclude small bowel gas. Diffuse atherosclerotic calcifications. Bilateral hip replacements. Vascular stent in the left proximal thigh. Vascular stents in the region of the common iliac arteries. IMPRESSION: 1. Diffuse bowel gas distension throughout the abdomen. The bowel gas  appears to be predominantly within the colon and findings could be associated with a colonic ileus. Electronically Signed   By: Juliene Balder M.D.   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 Currently patient is on Dilaudid  0.5 mg IV every 2 hours as needed pain, oxycodone  10 mg or 15 mg every 4 hours as needed moderate to severe pain.  Also, she is mostly bedbound, unable to walk around in the hallways which will worsen colonic ileus.  Unfortunately at this point, apart from maximizing laxatives and giving suppositories, treatment is limited.  Patient is already on lactulose  20 g twice a day as needed, we will change that to 30 g 3 times a day around-the-clock. Continue MiraLAX  17 g twice a day. Will add Dulcolax suppository 10 g twice a day. Will continue to monitor.   LOS: 5 days   Estelita Manas, MD  07/04/2024, 7:32 AM

## 2024-07-05 ENCOUNTER — Inpatient Hospital Stay (HOSPITAL_COMMUNITY)

## 2024-07-05 DIAGNOSIS — S72301A Unspecified fracture of shaft of right femur, initial encounter for closed fracture: Secondary | ICD-10-CM | POA: Diagnosis not present

## 2024-07-05 LAB — COMPREHENSIVE METABOLIC PANEL WITH GFR
ALT: 10 U/L (ref 0–44)
AST: 20 U/L (ref 15–41)
Albumin: 2.9 g/dL — ABNORMAL LOW (ref 3.5–5.0)
Alkaline Phosphatase: 60 U/L (ref 38–126)
Anion gap: 16 — ABNORMAL HIGH (ref 5–15)
BUN: 39 mg/dL — ABNORMAL HIGH (ref 8–23)
CO2: 21 mmol/L — ABNORMAL LOW (ref 22–32)
Calcium: 8.4 mg/dL — ABNORMAL LOW (ref 8.9–10.3)
Chloride: 94 mmol/L — ABNORMAL LOW (ref 98–111)
Creatinine, Ser: 1.05 mg/dL — ABNORMAL HIGH (ref 0.44–1.00)
GFR, Estimated: 55 mL/min — ABNORMAL LOW (ref >60)
Glucose, Bld: 102 mg/dL — ABNORMAL HIGH (ref 70–99)
Potassium: 4.2 mmol/L (ref 3.5–5.1)
Sodium: 131 mmol/L — ABNORMAL LOW (ref 135–145)
Total Bilirubin: 1.1 mg/dL (ref 0.0–1.2)
Total Protein: 6.8 g/dL (ref 6.5–8.1)

## 2024-07-05 LAB — CBC WITH DIFFERENTIAL/PLATELET
Abs Immature Granulocytes: 0.28 K/uL — ABNORMAL HIGH (ref 0.00–0.07)
Basophils Absolute: 0 K/uL (ref 0.0–0.1)
Basophils Relative: 0 %
Eosinophils Absolute: 0 K/uL (ref 0.0–0.5)
Eosinophils Relative: 0 %
HCT: 28.8 % — ABNORMAL LOW (ref 36.0–46.0)
Hemoglobin: 9.1 g/dL — ABNORMAL LOW (ref 12.0–15.0)
Immature Granulocytes: 2 %
Lymphocytes Relative: 2 %
Lymphs Abs: 0.3 K/uL — ABNORMAL LOW (ref 0.7–4.0)
MCH: 32 pg (ref 26.0–34.0)
MCHC: 31.6 g/dL (ref 30.0–36.0)
MCV: 101.4 fL — ABNORMAL HIGH (ref 80.0–100.0)
Monocytes Absolute: 0.9 K/uL (ref 0.1–1.0)
Monocytes Relative: 7 %
Neutro Abs: 12.2 K/uL — ABNORMAL HIGH (ref 1.7–7.7)
Neutrophils Relative %: 89 %
Platelets: 363 K/uL (ref 150–400)
RBC: 2.84 MIL/uL — ABNORMAL LOW (ref 3.87–5.11)
RDW: 24.4 % — ABNORMAL HIGH (ref 11.5–15.5)
WBC: 13.7 K/uL — ABNORMAL HIGH (ref 4.0–10.5)
nRBC: 1.8 % — ABNORMAL HIGH (ref 0.0–0.2)

## 2024-07-05 LAB — MAGNESIUM: Magnesium: 2.2 mg/dL (ref 1.7–2.4)

## 2024-07-05 LAB — PHOSPHORUS: Phosphorus: 4.5 mg/dL (ref 2.5–4.6)

## 2024-07-05 MED ORDER — ONDANSETRON HCL 4 MG/2ML IJ SOLN
4.0000 mg | Freq: Four times a day (QID) | INTRAMUSCULAR | Status: AC | PRN
  Administered 2024-07-05 – 2024-07-12 (×2): 4 mg via INTRAVENOUS
  Filled 2024-07-05 (×2): qty 2

## 2024-07-05 MED ORDER — ARFORMOTEROL TARTRATE 15 MCG/2ML IN NEBU
15.0000 ug | INHALATION_SOLUTION | Freq: Two times a day (BID) | RESPIRATORY_TRACT | Status: DC
  Administered 2024-07-05 – 2024-07-20 (×25): 15 ug via RESPIRATORY_TRACT
  Filled 2024-07-05 (×27): qty 2

## 2024-07-05 MED ORDER — LINACLOTIDE 145 MCG PO CAPS
290.0000 ug | ORAL_CAPSULE | Freq: Once | ORAL | Status: AC
Start: 2024-07-05 — End: 2024-07-05
  Administered 2024-07-05: 290 ug via ORAL
  Filled 2024-07-05: qty 2

## 2024-07-05 MED ORDER — FLEET ENEMA RE ENEM
1.0000 | ENEMA | Freq: Once | RECTAL | Status: DC
  Filled 2024-07-05: qty 1

## 2024-07-05 MED ORDER — ASPIRIN 81 MG PO TBEC
81.0000 mg | DELAYED_RELEASE_TABLET | Freq: Every morning | ORAL | Status: DC
  Administered 2024-07-09 – 2024-07-12 (×4): 81 mg via ORAL
  Filled 2024-07-05 (×5): qty 1

## 2024-07-05 MED ORDER — ENOXAPARIN SODIUM 40 MG/0.4ML IJ SOSY
40.0000 mg | PREFILLED_SYRINGE | Freq: Every day | INTRAMUSCULAR | Status: DC
  Administered 2024-07-06 – 2024-07-13 (×8): 40 mg via SUBCUTANEOUS
  Filled 2024-07-05 (×8): qty 0.4

## 2024-07-05 MED ORDER — FLEET ENEMA RE ENEM: 1.0000 | ENEMA | Freq: Once | RECTAL | Status: DC

## 2024-07-05 MED ORDER — IPRATROPIUM-ALBUTEROL 0.5-2.5 (3) MG/3ML IN SOLN
3.0000 mL | Freq: Four times a day (QID) | RESPIRATORY_TRACT | Status: DC
  Administered 2024-07-05 – 2024-07-08 (×13): 3 mL via RESPIRATORY_TRACT
  Filled 2024-07-05 (×13): qty 3

## 2024-07-05 MED ORDER — BUDESONIDE 0.25 MG/2ML IN SUSP
0.2500 mg | Freq: Two times a day (BID) | RESPIRATORY_TRACT | Status: DC
  Administered 2024-07-05 – 2024-07-13 (×16): 0.25 mg via RESPIRATORY_TRACT
  Filled 2024-07-05 (×16): qty 2

## 2024-07-05 MED ORDER — LIDOCAINE 5 % EX PTCH
2.0000 | MEDICATED_PATCH | CUTANEOUS | Status: DC
  Administered 2024-07-05 – 2024-07-20 (×10): 2 via TRANSDERMAL
  Filled 2024-07-05 (×15): qty 2

## 2024-07-05 NOTE — Progress Notes (Addendum)
 Subjective: States pain in right hip is 10 out of 10 in intensity, controlled briefly with pain medications.  Although she is on lactulose  30 g 3 times a day and Dulcolax suppository 10 mg twice a day, she has not had any bowel movements and passes only minimal amount of gas with suppositories.  Objective: Vital signs in last 24 hours: Temp:  [98.3 F (36.8 C)-98.4 F (36.9 C)] 98.3 F (36.8 C) (10/19 0847) Pulse Rate:  [85-100] 100 (10/19 0847) Resp:  [16-19] 19 (10/19 0847) BP: (125-150)/(54-69) 150/69 (10/19 0847) SpO2:  [93 %-96 %] 95 % (10/19 0847) Weight change:  Last BM Date : 06/29/24  PE: On oxygen  via nasal cannula GENERAL: Appears uncomfortable  ABDOMEN: Distended abdomen, diastases recti, sluggish bowel sounds EXTREMITIES: No deformity  Lab Results: Results for orders placed or performed during the hospital encounter of 06/29/24 (from the past 48 hours)  CBC with Differential/Platelet     Status: Abnormal   Collection Time: 07/04/24  5:56 AM  Result Value Ref Range   WBC 9.3 4.0 - 10.5 K/uL   RBC 2.70 (L) 3.87 - 5.11 MIL/uL   Hemoglobin 8.9 (L) 12.0 - 15.0 g/dL   HCT 72.6 (L) 63.9 - 53.9 %   MCV 101.1 (H) 80.0 - 100.0 fL   MCH 33.0 26.0 - 34.0 pg   MCHC 32.6 30.0 - 36.0 g/dL   RDW 75.7 (H) 88.4 - 84.4 %   Platelets 294 150 - 400 K/uL   nRBC 2.0 (H) 0.0 - 0.2 %   Neutrophils Relative % 85 %   Neutro Abs 7.9 (H) 1.7 - 7.7 K/uL   Lymphocytes Relative 4 %   Lymphs Abs 0.4 (L) 0.7 - 4.0 K/uL   Monocytes Relative 8 %   Monocytes Absolute 0.8 0.1 - 1.0 K/uL   Eosinophils Relative 0 %   Eosinophils Absolute 0.0 0.0 - 0.5 K/uL   Basophils Relative 0 %   Basophils Absolute 0.0 0.0 - 0.1 K/uL   WBC Morphology MORPHOLOGY UNREMARKABLE    Smear Review Normal platelet morphology    Immature Granulocytes 3 %   Abs Immature Granulocytes 0.27 (H) 0.00 - 0.07 K/uL   Polychromasia PRESENT     Comment: Performed at [facility], [address], [place], [place]  [address]  Comprehensive metabolic panel with GFR     Status: Abnormal   Collection Time: 07/04/24  5:56 AM  Result Value Ref Range   Sodium 133 (L) 135 - 145 mmol/L   Potassium 3.9 3.5 - 5.1 mmol/L   Chloride 95 (L) 98 - 111 mmol/L   CO2 25 22 - 32 mmol/L   Glucose, Bld 88 70 - 99 mg/dL    Comment: Glucose reference range applies only to samples taken after fasting for at least 8 hours.   BUN 30 (H) 8 - 23 mg/dL   Creatinine, Ser 9.07 0.44 - 1.00 mg/dL   Calcium  8.9 8.9 - 10.3 mg/dL   Total Protein 6.7 6.5 - 8.1 g/dL   Albumin  2.9 (L) 3.5 - 5.0 g/dL   AST 19 15 - 41 U/L   ALT 12 0 - 44 U/L   Alkaline Phosphatase 58 38 - 126 U/L   Total Bilirubin 1.4 (H) 0.0 - 1.2 mg/dL   GFR, Estimated >39 >39 mL/min    Comment: (NOTE) Calculated using the CKD-EPI Creatinine Equation (2021)    Anion gap 13 5 - 15    Comment: Performed at [facility],  [address] [place], [place] [address]  Magnesium      Status: None   Collection Time: 07/04/24  5:56 AM  Result Value Ref Range   Magnesium  2.2 1.7 - 2.4 mg/dL    Comment: Performed at [facility], [address], [place], [place] [address]  Phosphorus     Status: Abnormal   Collection Time: 07/04/24  5:56 AM  Result Value Ref Range   Phosphorus 5.3 (H) 2.5 - 4.6 mg/dL    Comment: Performed at [facility], [address] [place], [place] [address]  CBC with Differential/Platelet     Status: Abnormal   Collection Time: 07/05/24  7:19 AM  Result Value Ref Range   WBC 13.7 (H) 4.0 - 10.5 K/uL   RBC 2.84 (L) 3.87 - 5.11 MIL/uL   Hemoglobin 9.1 (L) 12.0 - 15.0 g/dL   HCT 71.1 (L) 63.9 - 53.9 %   MCV 101.4 (H) 80.0 - 100.0 fL   MCH 32.0 26.0 - 34.0 pg   MCHC 31.6 30.0 - 36.0 g/dL   RDW 75.5 (H) 88.4 - 84.4 %   Platelets 363 150 - 400 K/uL   nRBC 1.8 (H) 0.0 - 0.2 %   Neutrophils Relative % 89 %   Neutro Abs 12.2 (H) 1.7 - 7.7 K/uL   Lymphocytes Relative 2 %   Lymphs Abs 0.3 (L) 0.7 - 4.0 K/uL   Monocytes Relative 7 %    Monocytes Absolute 0.9 0.1 - 1.0 K/uL   Eosinophils Relative 0 %   Eosinophils Absolute 0.0 0.0 - 0.5 K/uL   Basophils Relative 0 %   Basophils Absolute 0.0 0.0 - 0.1 K/uL   Immature Granulocytes 2 %   Abs Immature Granulocytes 0.28 (H) 0.00 - 0.07 K/uL    Comment: Performed at [facility], [address], [place], [place] [address]  Comprehensive metabolic panel with GFR     Status: Abnormal   Collection Time: 07/05/24  7:19 AM  Result Value Ref Range   Sodium 131 (L) 135 - 145 mmol/L   Potassium 4.2 3.5 - 5.1 mmol/L   Chloride 94 (L) 98 - 111 mmol/L   CO2 21 (L) 22 - 32 mmol/L   Glucose, Bld 102 (H) 70 - 99 mg/dL    Comment: Glucose reference range applies only to samples taken after fasting for at least 8 hours.   BUN 39 (H) 8 - 23 mg/dL   Creatinine, Ser 8.94 (H) 0.44 - 1.00 mg/dL   Calcium  8.4 (L) 8.9 - 10.3 mg/dL   Total Protein 6.8 6.5 - 8.1 g/dL   Albumin  2.9 (L) 3.5 - 5.0 g/dL   AST 20 15 - 41 U/L   ALT 10 0 - 44 U/L   Alkaline Phosphatase 60 38 - 126 U/L   Total Bilirubin 1.1 0.0 - 1.2 mg/dL   GFR, Estimated 55 (L) >60 mL/min    Comment: (NOTE) Calculated using the CKD-EPI Creatinine Equation (2021)    Anion gap 16 (H) 5 - 15    Comment: Performed at [facility], [address] [place], [place] [address]  Magnesium      Status: None   Collection Time: 07/05/24  7:19 AM  Result Value Ref Range   Magnesium  2.2 1.7 - 2.4 mg/dL    Comment: Performed at [facility], [address], [place], [place] [address]  Phosphorus     Status: None   Collection Time: 07/05/24  7:19  AM  Result Value Ref Range   Phosphorus 4.5 2.5 - 4.6 mg/dL    Comment: Performed at [facility], [address], [place], [place] [address]    Studies/Results: No results found.  Medications: I have reviewed the patient's current medications.  Assessment: Colonic ileus, worsened by continued need for narcotics and immobility  Right hip fracture, on oxycodone  10  to 15 mg every 4 hours as needed for moderate to severe pain and Dilaudid  0.5 mg IV every 2 hours as needed severe pain.  Plan: Will give Linzess 290 mcg 1 dose today to see if this helps. Will give 1 dose Fleet enema now. Continue lactulose  30 g 3 times a day and Dulcolax suppository 10 mg twice a day and MiraLAX  17 g twice a day. Unfortunately, with continued use of narcotics and patient not being able to move around, colonic ileus likely will not improve.  Marie Manas, MD 07/05/2024, 10:47 AM

## 2024-07-05 NOTE — Progress Notes (Addendum)
 PROGRESS NOTE    Marie Johnson  FMW:[id number] DOB: 04-15-1951 DOA: 06/29/2024 PCP: Stephanie Charlene CROME, MD  Chief Complaint  Patient presents with   Fall    Brief Narrative:   Marie Johnson is Marie Johnson 73 y.o. female with medical history significant of COPD on chronic 2 L CAD, PAD, HTN ,HLD, bilateral hip replacements presented with mechanical fall after she tripped on her oxygen  tubing resulting in right hip pain. Denies head trauma, no LOC.  In the ED, vital signs fairly stable, saturating well on 2 L of O2 (at baseline), labs noted with WBC 10.6, hemoglobin 9.3, otherwise unremarkable.  Orthopedics consulted.  Admitted for further management.  Assessment & Plan:   Principal Problem:   Closed right hip fracture (HCC) Active Problems:   Coronary artery disease involving native coronary artery of native heart   COPD (chronic obstructive pulmonary disease) (HCC)   Hypertension   Chronic diastolic CHF (congestive heart failure) (HCC)   Tobacco abuse   Hyperlipidemia   Malnutrition of moderate degree   Closed fracture of shaft of right femur (HCC)  Addendum Acute SOB this afternoon - wheezing reported per RN CXR with streaky bibasilar opacity Improved after nebs x2 Will schedule nebs - hold off on steroids for now, currently not wheezing, low threshold to start Seen this PM, no wheezing appreciated on exam - feeling better after nebs.  Will ctm.  Acute periprosthetic fracture of the proximal right femur  Mechanical fall CT showed periprosthetic fracture of proximal R femur, ill defined subcutaneous hematoma posterolaterally Orthopedics consulted  Pain management - oxy prn (increased, on chronic opiates at home).  Scheduled APAP.  Robaxin. Fall precautions   Abdominal Distension Colonic Ileus  KUB 10/16 with diffuse bowel gas distension throughout the abdomen - concerning for colonic ileus CT with gaseous distention of colon without discrete focal transition point or mass  lesion Appreciate GI assistance - aggressive bowel regimen  Hx diastasis recti  Bowel regimen  Chronic hypoxic respiratory failure COPD Appears stable at baseline, patient is on 2 L of O2 Stable    Normocytic anemia Anemia of chronic disease Vitamin B12 deficiency Baseline hemoglobin around 10-11 No evidence of significant bleeding, although CT of hip showed some ill-defined subcutaneous hematoma Anemia panel WNL, except for vitamin B12--> 371 Vitamin B12 supplementation Trend CBC, monitor closely   Hypomagnesemia Replaced as needed   CAD  PAD Hyperlipidemia  s/p LHC 03/2023 with mild - moderate non obstructive disease - recommended ASA daily at that time Apparently recently stopped plavix  2 weeks ago?  Seems like indication for DAPT was PAD (09/2022 vascular note recommends DAPT) Aspirin   Continue Lipitor    Chronic diastolic HF Currently appears euvolemic Repeat echo with EF 60 to 65%, no regional wall motion abnormality Hold home Lasix , Aldactone  for now, continue with verapamil  and monitor closely   Hypertension BP ok today Lasix  and aldactone  on hold Verapamil    Gout allopurinol    Abnormal CT Scan Findings concerning for organizing pneumonia in R lung base Nodular thickening of adrenal glands Needs follow up imaging / outpatient follow up       DVT prophylaxis: SCD -> lovenox  Code Status: full Family Communication: none Disposition:   Status is: Inpatient Remains inpatient appropriate because: need for continued inpatient care   Consultants:  ortho  Procedures:  none  Antimicrobials:  Anti-infectives (From admission, onward)    None       Subjective: Continued R hip pain  Objective: Vitals:   07/04/24 2300  07/05/24 0028 07/05/24 0828 07/05/24 0847  BP:    (!) 150/69  Pulse:   89 100  Resp:   16 19  Temp: 98.4 F (36.9 C)   98.3 F (36.8 C)  TempSrc:    Oral  SpO2:  94% 95% 95%  Weight:      Height:        Intake/Output  Summary (Last 24 hours) at 07/05/2024 1540 Last data filed at 07/05/2024 1300 Gross per 24 hour  Intake 240 ml  Output 300 ml  Net -60 ml    Filed Weights   06/30/24 1100 07/03/24 2004  Weight: 61.2 kg 61.2 kg    Examination:  General: No acute distress. Cardiovascular: RRR Lungs: unlabored Abdomen: distended, tight  Neurological: Alert and oriented 3. Moves all extremities 4 with equal strength. Cranial nerves II through XII grossly intact. Extremities: No clubbing or cyanosis. No edema.  Data Reviewed: I have personally reviewed following labs and imaging studies  CBC: Recent Labs  Lab 06/29/24 1910 06/30/24 0439 07/01/24 0451 07/02/24 0506 07/03/24 0501 07/04/24 0556 07/05/24 0719  WBC 10.6*   < > 8.3 9.1 8.3 9.3 13.7*  NEUTROABS 9.9*  --  6.8  --  6.9 7.9* 12.2*  HGB 9.3*   < > 8.9* 8.8* 8.8* 8.9* 9.1*  HCT 29.3*   < > 27.3* 27.2* 27.1* 27.3* 28.8*  MCV 102.1*   < > 101.1* 100.0 100.7* 101.1* 101.4*  PLT 304   < > 267 254 248 294 363   < > = values in this interval not displayed.    Basic Metabolic Panel: Recent Labs  Lab 06/29/24 2243 06/30/24 0439 07/01/24 0451 07/02/24 0506 07/03/24 0501 07/04/24 0556 07/05/24 0719  NA  --    < > 134* 136 135 133* 131*  K  --    < > 4.0 3.4* 3.6 3.9 4.2  CL  --    < > 97* 98 98 95* 94*  CO2  --    < > 26 26 26 25  21*  GLUCOSE  --    < > 122* 121* 118* 88 102*  BUN  --    < > 23 22 22  30* 39*  CREATININE  --    < > 0.97 0.76 0.76 0.92 1.05*  CALCIUM   --    < > 9.3 9.1 8.9 8.9 8.4*  MG 1.6*  --  1.9  --  2.0 2.2 2.2  PHOS 2.7  --   --  4.3 3.7 5.3* 4.5   < > = values in this interval not displayed.    GFR: Estimated Creatinine Clearance: 44.7 mL/min (Marie Johnson) (by C-G formula based on SCr of 1.05 mg/dL (H)).  Liver Function Tests: Recent Labs  Lab 06/30/24 0439 07/01/24 0451 07/03/24 0501 07/04/24 0556 07/05/24 0719  AST 17 18 17 19 20   ALT 11 11 11 12 10   ALKPHOS 70 63 58 58 60  BILITOT 1.6* 1.0 1.1  1.4* 1.1  PROT 6.7 6.8 6.8 6.7 6.8  ALBUMIN  3.5 3.3* 3.0* 2.9* 2.9*    CBG: No results for input(s): GLUCAP in the last 168 hours.   No results found for this or any previous visit (from the past 240 hours).       Radiology Studies: No results found.        Scheduled Meds:  acetaminophen   1,000 mg Oral Q8H   allopurinol   300 mg Oral QPM   bisacodyl   10 mg Rectal BID  feeding supplement  237 mL Oral TID BM   fluticasone  furoate-vilanterol  1 puff Inhalation Daily   lactulose   30 g Oral TID   lidocaine   2 patch Transdermal Q24H   multivitamin with minerals  1 tablet Oral Daily   pantoprazole   40 mg Oral BID   polyethylene glycol  17 g Oral BID   sodium phosphate   1 enema Rectal Once   umeclidinium bromide   1 puff Inhalation Daily   verapamil   240 mg Oral Daily   Continuous Infusions:   LOS: 6 days    Time spent: over 30 min     Marie Monte, MD [facility]   To contact the attending provider between 7A-7P or the covering provider during after hours 7P-7A, please log into the web site [email or web address] and access using universal [place] password for that web site. If you do not have the password, please call the hospital operator.  07/05/2024, 3:40 PM

## 2024-07-06 ENCOUNTER — Inpatient Hospital Stay (HOSPITAL_COMMUNITY)

## 2024-07-06 DIAGNOSIS — S72301A Unspecified fracture of shaft of right femur, initial encounter for closed fracture: Secondary | ICD-10-CM | POA: Diagnosis not present

## 2024-07-06 LAB — COMPREHENSIVE METABOLIC PANEL WITH GFR
ALT: 12 U/L (ref 0–44)
AST: 23 U/L (ref 15–41)
Albumin: 2.6 g/dL — ABNORMAL LOW (ref 3.5–5.0)
Alkaline Phosphatase: 55 U/L (ref 38–126)
Anion gap: 14 (ref 5–15)
BUN: 52 mg/dL — ABNORMAL HIGH (ref 8–23)
CO2: 23 mmol/L (ref 22–32)
Calcium: 8.1 mg/dL — ABNORMAL LOW (ref 8.9–10.3)
Chloride: 92 mmol/L — ABNORMAL LOW (ref 98–111)
Creatinine, Ser: 1.3 mg/dL — ABNORMAL HIGH (ref 0.44–1.00)
GFR, Estimated: 43 mL/min — ABNORMAL LOW (ref >60)
Glucose, Bld: 118 mg/dL — ABNORMAL HIGH (ref 70–99)
Potassium: 3.1 mmol/L — ABNORMAL LOW (ref 3.5–5.1)
Sodium: 129 mmol/L — ABNORMAL LOW (ref 135–145)
Total Bilirubin: 1 mg/dL (ref 0.0–1.2)
Total Protein: 6.5 g/dL (ref 6.5–8.1)

## 2024-07-06 LAB — CBC WITH DIFFERENTIAL/PLATELET
Abs Immature Granulocytes: 0.42 K/uL — ABNORMAL HIGH (ref 0.00–0.07)
Basophils Absolute: 0 K/uL (ref 0.0–0.1)
Basophils Relative: 0 %
Eosinophils Absolute: 0 K/uL (ref 0.0–0.5)
Eosinophils Relative: 0 %
HCT: 26 % — ABNORMAL LOW (ref 36.0–46.0)
Hemoglobin: 8.4 g/dL — ABNORMAL LOW (ref 12.0–15.0)
Immature Granulocytes: 4 %
Lymphocytes Relative: 3 %
Lymphs Abs: 0.3 K/uL — ABNORMAL LOW (ref 0.7–4.0)
MCH: 31.8 pg (ref 26.0–34.0)
MCHC: 32.3 g/dL (ref 30.0–36.0)
MCV: 98.5 fL (ref 80.0–100.0)
Monocytes Absolute: 0.9 K/uL (ref 0.1–1.0)
Monocytes Relative: 8 %
Neutro Abs: 8.7 K/uL — ABNORMAL HIGH (ref 1.7–7.7)
Neutrophils Relative %: 85 %
Platelets: 355 K/uL (ref 150–400)
RBC: 2.64 MIL/uL — ABNORMAL LOW (ref 3.87–5.11)
RDW: 23.5 % — ABNORMAL HIGH (ref 11.5–15.5)
Smear Review: NORMAL
WBC: 10.3 K/uL (ref 4.0–10.5)
nRBC: 1.6 % — ABNORMAL HIGH (ref 0.0–0.2)

## 2024-07-06 LAB — HEMOGLOBIN AND HEMATOCRIT, BLOOD
HCT: 25.2 % — ABNORMAL LOW (ref 36.0–46.0)
Hemoglobin: 8.3 g/dL — ABNORMAL LOW (ref 12.0–15.0)

## 2024-07-06 LAB — MAGNESIUM: Magnesium: 2.3 mg/dL (ref 1.7–2.4)

## 2024-07-06 LAB — PHOSPHORUS: Phosphorus: 5 mg/dL — ABNORMAL HIGH (ref 2.5–4.6)

## 2024-07-06 LAB — BRAIN NATRIURETIC PEPTIDE: B Natriuretic Peptide: 164.7 pg/mL — ABNORMAL HIGH (ref 0.0–100.0)

## 2024-07-06 MED ORDER — POTASSIUM CHLORIDE CRYS ER 20 MEQ PO TBCR: 40.0000 meq | EXTENDED_RELEASE_TABLET | ORAL | Status: DC

## 2024-07-06 MED ORDER — HYDROMORPHONE HCL 1 MG/ML IJ SOLN
0.5000 mg | INTRAMUSCULAR | Status: DC | PRN
  Administered 2024-07-06 – 2024-07-11 (×30): 1 mg via INTRAVENOUS
  Filled 2024-07-06 (×30): qty 1

## 2024-07-06 MED ORDER — ACETAMINOPHEN 10 MG/ML IV SOLN
1000.0000 mg | Freq: Four times a day (QID) | INTRAVENOUS | Status: AC
Start: 2024-07-06 — End: 2024-07-07
  Administered 2024-07-06 – 2024-07-07 (×4): 1000 mg via INTRAVENOUS
  Filled 2024-07-06 (×4): qty 100

## 2024-07-06 MED ORDER — KCL-LACTATED RINGERS-D5W 20 MEQ/L IV SOLN
INTRAVENOUS | Status: DC
  Filled 2024-07-06 (×3): qty 1000

## 2024-07-06 MED ORDER — POTASSIUM CHLORIDE 10 MEQ/100ML IV SOLN
10.0000 meq | INTRAVENOUS | Status: AC
  Administered 2024-07-06 (×6): 10 meq via INTRAVENOUS
  Filled 2024-07-06 (×6): qty 100

## 2024-07-06 MED ORDER — PANTOPRAZOLE SODIUM 40 MG IV SOLR
40.0000 mg | Freq: Two times a day (BID) | INTRAVENOUS | Status: DC
  Administered 2024-07-06 – 2024-07-10 (×10): 40 mg via INTRAVENOUS
  Filled 2024-07-06 (×11): qty 10

## 2024-07-06 NOTE — Progress Notes (Signed)
 Nutrition Follow-up  DOCUMENTATION CODES:   Non-severe (moderate) malnutrition in context of chronic illness  INTERVENTION:  NPO  Recommend starting TPN if ileus does not resolve within 24-48 hours Pt remains at risk for refeeding. Continue to monitor K+, Phos, Mg and replete as needed. Recommend 100 mg thiamine x 5-7 once diet resumed/TPN initiated.   NUTRITION DIAGNOSIS:   Moderate Malnutrition related to chronic illness as evidenced by energy intake < 75% for > or equal to 1 month, mild fat depletion, moderate muscle depletion.   GOAL:   Patient will meet greater than or equal to 90% of their needs   MONITOR:   PO intake, Supplement acceptance, Weight trends  REASON FOR ASSESSMENT:   Consult Assessment of nutrition requirement/status (Hip/Femur fracture patient)  ASSESSMENT:   medical history significant of COPD on chronic 2 L  CAD, PAD, HTN ,HLD, bilateral hip replacements presented with mechanical fall after she tripped on her oxygen  tubing resulting in right hip pain.  Patient seen in room, NGT in place hooked to LIWS ( ~200 ml seen in canister). KUB on 10/16 with diffuse bowel gas distension throughout the abdomen - concerning for colonic ileus and pt started on clear liquid. NGT placed yesterday due to N/V with brown liquid. Pt reports minimal intake of clear liquids over the weekend. Feeling week today but nausea and abd pain have improved. Repeat KUB thsi am with gaseous dilation of large bowel. Discussed with MD that patient may require TPN in the next 24-48 if ileus does not resolved due to moderate malnutrition on admission.   Labs: Sodium 129, K 3.1, Glu 118, BUN 52, Cr 1.30, GFR 43, Phos 5.0   Meds reviewed.   UOP: 200 ml   NUTRITION - FOCUSED PHYSICAL EXAM:  Flowsheet Row Most Recent Value  Orbital Region Mild depletion  Upper Arm Region Moderate depletion  Thoracic and Lumbar Region Unable to assess  [pt with back pain]  Buccal Region Mild depletion   Temple Region Moderate depletion  Clavicle Bone Region Moderate depletion  Clavicle and Acromion Bone Region Moderate depletion  Scapular Bone Region Unable to assess  [pt with back pain]  Dorsal Hand Moderate depletion  Patellar Region Moderate depletion  Anterior Thigh Region Moderate depletion  Posterior Calf Region Mild depletion  Hair Reviewed  Eyes Reviewed  Mouth Reviewed  Skin Reviewed  Nails Reviewed    Diet Order:   Diet Order             Diet NPO time specified  Diet effective now                   EDUCATION NEEDS:   Education needs have been addressed  Skin:  Skin Assessment: Reviewed RN Assessment  Last BM:  PTA  Height:   Ht Readings from Last 1 Encounters:  07/03/24 5' 7.5 (1.715 m)    Weight:   Wt Readings from Last 1 Encounters:  07/03/24 61.2 kg    BMI:  Body mass index is 20.82 kg/m.  Estimated Nutritional Needs:   Kcal:  1530-1836 kcals  Protein:  61-91 grams  Fluid:  1.5-1.8L/d  Madalyn Potters, MS, RD, LDN Clinical Dietitian  Contact via secure chat. If unavailable, use group chat RD Inpatient.

## 2024-07-06 NOTE — Progress Notes (Signed)
 [facility] Progress Note  SUBJECTIVE:   Interval history: Marie Johnson was seen and evaluated today at bedside.  Interval events of overnight reviewed, now with NG tube, 200 cc bilious contents in collection container.  Noted that she has passing gas though has not had a bowel movement.  Linzess was started last night.  She noted that her abdomen feels less distended.  Past Medical History:  Diagnosis Date   AAA (abdominal aortic aneurysm)    3.4 cm   Anemia    Anxiety    Aortic atherosclerosis    Arthritis    Asthma    CAD (coronary artery disease)    a. Nonobstructive CAD by cath 07/17/13.   [facility])    Carotid artery disease    Carotid US  8/22: Bilateral ICA 1-39; right subclavian stenosis; right thyroid  nodule   CHD (congenital heart disease)    pt unaware???   Chest pain    a. Adm 10-07/2013 - CTA neg for PE/dissection, cath with nonobstructive disease, CP ?due to uncontrolled HTN vs coronary vasospasm.   CHF (congestive heart failure) (HCC)    Closed head injury with concussion    COPD (chronic obstructive pulmonary disease) (HCC)    Coronary artery disease    Echocardiogram    Echocardiogram 06/2019: EF 60-65, impaired relaxation (Gr 1 DD), mild MAC, mild MR, mild to mod aortic valve sclerosis (no AS), trivial TR, mild LAE, normal RVSF, RVSP 30.7 (mildly elevated)   GERD (gastroesophageal reflux disease)    History of blood transfusion    childbirth, Hysterectomy   Hyperlipidemia    Hypertension    Neuropathy    OA (osteoarthritis)    Osteoporosis    PAD (peripheral artery disease)    a. s/p L fem-pop bypass b. recent evaluation at [facility], unamenable to intervention   PAD (peripheral artery disease)    Palpitations 02/22/2016   Pneumonia    Pre-diabetes    Premature ventricular contraction    PUD (peptic ulcer disease)    PVD (peripheral vascular disease) 07/17/2013   Thoracic aortic aneurysm    Chest/aorta CTA 04/2022: 14 x 8 focal saccular  aneurysm versus penetrating atherosclerotic ulcer involving left side of proximal portion of transverse aortic arch; descending thoracic aorta and proximal abdominal aorta aneurysm 4.4 cm; aortic atherosclerosis; emphysema   Tobacco abuse    Wears dentures    Wears glasses    Past Surgical History:  Procedure Laterality Date   ABDOMINAL AORTOGRAM W/LOWER EXTREMITY N/A 03/09/2020   Procedure: ABDOMINAL AORTOGRAM W/LOWER EXTREMITY;  Surgeon: Gretta Lonni PARAS, MD;  Location: [facility]VAS[facility];  Service: Cardiovascular;  Laterality: N/A;   ABDOMINAL AORTOGRAM W/LOWER EXTREMITY N/A 07/26/2022   Procedure: ABDOMINAL AORTOGRAM W/LOWER EXTREMITY;  Surgeon: Gretta Lonni PARAS, MD;  Location: [facility];  Service: Cardiovascular;  Laterality: N/A;   ABDOMINAL AORTOGRAM W/LOWER EXTREMITY N/A 09/20/2022   Procedure: ABDOMINAL AORTOGRAM W/LOWER EXTREMITY;  Surgeon: Gretta Lonni PARAS, MD;  Location: [facility];  Service: Cardiovascular;  Laterality: N/A;   ABDOMINAL HYSTERECTOMY     BREAST SURGERY     left cyst excision x 2   CARDIAC CATHETERIZATION     several years ago, nonobstructive, 50-60% blockages   DIRECT LARYNGOSCOPY Bilateral 03/13/2023   Procedure: MICRO DIRECT LARYNGOSCOPY WITH BIOPSY;  Surgeon: Carlie Clark, MD;  Location: [facility] OR;  Service: ENT;  Laterality: Bilateral;   ENDARTERECTOMY FEMORAL Right 03/18/2020   Procedure: ENDARTERECTOMY FEMORAL WITH PROFUNDAPLASTY;  Surgeon: Gretta Lonni PARAS, MD;  Location: [facility] OR;  Service: Vascular;  Laterality: Right;   FEMORAL-POPLITEAL BYPASS GRAFT Right 03/18/2020   Procedure: BYPASS GRAFT FEMORAL-POPLITEAL ARTERY using GORE PROPATEN VASCULAR GRAFT;  Surgeon: Gretta Lonni PARAS, MD;  Location: [facility] OR;  Service: Vascular;  Laterality: Right;   HIP SURGERY     left - bars and screws placed   HIP SURGERY     INSERTION OF ILIAC STENT Right 03/18/2020   Procedure: INSERTION OF GORE VIABAHN  ILIAC STENT;  Surgeon: Gretta Lonni PARAS, MD;  Location: [facility] OR;  Service: Vascular;  Laterality: Right;   LEFT HEART CATH AND CORONARY ANGIOGRAPHY N/A 04/08/2023   Procedure: LEFT HEART CATH AND CORONARY ANGIOGRAPHY;  Surgeon: Anner Alm ORN, MD;  Location: [facility] INVASIVE CV LAB;  Service: Cardiovascular;  Laterality: N/A;   LEFT HEART CATHETERIZATION WITH CORONARY ANGIOGRAM N/A 07/17/2013   Procedure: LEFT HEART CATHETERIZATION WITH CORONARY ANGIOGRAM;  Surgeon: Ezra GORMAN Shuck, MD;  Location: [facility] CATH LAB;  Service: Cardiovascular;  Laterality: N/A;   LOWER EXTREMITY ANGIOGRAM Right 03/18/2020   Procedure: RIGHT ILIAC ARTERIOGRAM WITH AN ILIAC STENT;  Surgeon: Gretta Lonni PARAS, MD;  Location: [facility] OR;  Service: Vascular;  Laterality: Right;   MULTIPLE TOOTH EXTRACTIONS     PATCH ANGIOPLASTY Right 03/18/2020   Procedure: PATCH ANGIOPLASTY using a GEORGE BIOLOGIC PATCH OF THE PROFUNDA;  Surgeon: Gretta Lonni PARAS, MD;  Location: [facility] OR;  Service: Vascular;  Laterality: Right;   PERIPHERAL VASCULAR CATHETERIZATION N/A 07/16/2016   Procedure: Abdominal Aortogram w/Lower Extremity;  Surgeon: Krystal JULIANNA Doing, MD;  Location: [facility] INVASIVE CV LAB;  Service: Cardiovascular;  Laterality: N/A;   PERIPHERAL VASCULAR INTERVENTION Left 03/09/2020   Procedure: PERIPHERAL VASCULAR INTERVENTION;  Surgeon: Gretta Lonni PARAS, MD;  Location: [facility];  Service: Cardiovascular;  Laterality: Left;  common and external illiac    ROTATOR CUFF REPAIR     right side, had torn ligaments as well   TUBAL LIGATION     VEIN BYPASS SURGERY     left leg   WRIST ARTHROCENTESIS     left - broke in 3 places so has bars and screws placed   Current Facility-Administered Medications  Medication Dose Route Frequency Provider Last Rate Last Admin   acetaminophen  (OFIRMEV ) IV 1,000 mg  1,000 mg Intravenous Q6H Perri DELENA Meliton Mickey., MD 400 mL/hr at 07/06/24 1236 1,000 mg at 07/06/24 1236   albuterol  (PROVENTIL ) (2.5 MG/3ML) 0.083% nebulizer solution 2.5 mg  2.5  mg Nebulization Q2H PRN Ezenduka, Nkeiruka J, MD   2.5 mg at 07/05/24 1552   allopurinol  (ZYLOPRIM ) tablet 300 mg  300 mg Oral QPM Ezenduka, Nkeiruka J, MD   300 mg at 07/05/24 1759   arformoterol (BROVANA) nebulizer solution 15 mcg  15 mcg Nebulization BID Perri DELENA Meliton Mickey., MD   15 mcg at 07/06/24 0754   aspirin  EC tablet 81 mg  81 mg Oral q AM Perri DELENA Meliton Mickey., MD       bisacodyl  (DULCOLAX) suppository 10 mg  10 mg Rectal BID Karki, Arya, MD   10 mg at 07/04/24 2250   budesonide  (PULMICORT ) nebulizer solution 0.25 mg  0.25 mg Nebulization BID Perri DELENA Meliton Mickey., MD   0.25 mg at 07/06/24 9245   dextrose  5% in lactated ringers  with KCl 20 mEq/L infusion   Intravenous Continuous Perri DELENA Meliton Mickey., MD       enoxaparin  (LOVENOX ) injection 40 mg  40 mg Subcutaneous Daily Perri DELENA Meliton Mickey., MD  40 mg at 07/06/24 0905   feeding supplement (ENSURE PLUS HIGH PROTEIN) liquid 237 mL  237 mL Oral TID BM Ezenduka, Nkeiruka J, MD   237 mL at 07/03/24 1019   HYDROmorphone  (DILAUDID ) injection 0.5-1 mg  0.5-1 mg Intravenous Q2H PRN Perri DELENA Meliton Mickey., MD       ipratropium-albuterol  (DUONEB) 0.5-2.5 (3) MG/3ML nebulizer solution 3 mL  3 mL Nebulization Q6H Perri DELENA Meliton Mickey., MD   3 mL at 07/06/24 0754   lactulose  (CHRONULAC ) 10 GM/15ML solution 30 g  30 g Oral TID Karki, Arya, MD   30 g at 07/05/24 2340   lidocaine  (LIDODERM ) 5 % 2 patch  2 patch Transdermal Q24H Perri DELENA Meliton Mickey., MD   2 patch at 07/06/24 1026   melatonin tablet 3 mg  3 mg Oral QHS PRN Doutova, Anastassia, MD   3 mg at 07/03/24 2023   methocarbamol (ROBAXIN) tablet 500 mg  500 mg Oral Q6H PRN Doutova, Anastassia, MD   500 mg at 07/05/24 9373   Or   methocarbamol (ROBAXIN) injection 500 mg  500 mg Intravenous Q6H PRN Doutova, Anastassia, MD       multivitamin with minerals tablet 1 tablet  1 tablet Oral Daily Ezenduka, Nkeiruka J, MD   1 tablet at 07/04/24 9096   ondansetron  (ZOFRAN ) injection 4 mg  4 mg  Intravenous Q6H PRN Daniels, James K, NP   4 mg at 07/05/24 2143   oxyCODONE  (Oxy IR/ROXICODONE ) immediate release tablet 10 mg  10 mg Oral Q4H PRN Perri DELENA Meliton Mickey., MD   10 mg at 07/05/24 0000   Or   oxyCODONE  (Oxy IR/ROXICODONE ) immediate release tablet 15 mg  15 mg Oral Q4H PRN Perri DELENA Meliton Mickey., MD   15 mg at 07/05/24 1804   pantoprazole  (PROTONIX ) injection 40 mg  40 mg Intravenous Q12H Perri DELENA Meliton Mickey., MD   40 mg at 07/06/24 1013   polyethylene glycol (MIRALAX  / GLYCOLAX ) packet 17 g  17 g Oral BID Perri DELENA Meliton Mickey., MD   17 g at 07/05/24 2340   potassium chloride  10 mEq in 100 mL IVPB  10 mEq Intravenous Q1 Hr x 6 Perri DELENA Meliton Mickey., MD 100 mL/hr at 07/06/24 1234 10 mEq at 07/06/24 1234   simethicone (MYLICON) chewable tablet 80 mg  80 mg Oral Q6H PRN Chavez, Abigail, NP   80 mg at 07/03/24 2023   sodium phosphate  (FLEET) enema 1 enema  1 enema Rectal Once Karki, Arya, MD       umeclidinium bromide  (INCRUSE ELLIPTA ) 62.5 MCG/ACT 1 puff  1 puff Inhalation Daily Reome, Earle J, RPH   1 puff at 07/06/24 0754   verapamil  (CALAN -SR) CR tablet 240 mg  240 mg Oral Daily Doutova, Anastassia, MD   240 mg at 07/05/24 9146   Facility-Administered Medications Ordered in Other Encounters  Medication Dose Route Frequency Provider Last Rate Last Admin   DOBUTamine  (DOBUTREX ) 1,000 mcg/mL in dextrose  5% 250 mL infusion  30 mcg/kg/min Intravenous Titrated Maranda Leim DEL, MD       Allergies as of 06/29/2024 - Review Complete 06/29/2024  Allergen Reaction Noted   Bee venom Anaphylaxis and Other (See Comments) 07/17/2013   Insect extract Anaphylaxis and Other (See Comments) 03/08/2020   Farxiga  [dapagliflozin ] Nausea And Vomiting 06/11/2022   Lyrica [pregabalin] Nausea And Vomiting 06/11/2022   Neurontin [gabapentin] Nausea And Vomiting and Other (See Comments) 11/09/2021   Nsaids Nausea Only and Other (See Comments) 03/08/2020  Latex Rash and Other (See Comments)  07/12/2016   Review of Systems:  Review of Systems  Respiratory:  Positive for shortness of breath.   Cardiovascular:  Negative for chest pain.  Gastrointestinal:  Positive for abdominal pain. Negative for nausea and vomiting.       Flatus    OBJECTIVE:   Temp:  [97.9 F (36.6 C)-98.4 F (36.9 C)] 97.9 F (36.6 C) (10/20 0902) Pulse Rate:  [84-97] 89 (10/20 0902) Resp:  [17-22] 17 (10/20 0902) BP: (135-155)/(62-95) 135/71 (10/20 0902) SpO2:  [88 %-96 %] 95 % (10/20 0902) Last BM Date : 06/29/24 Physical Exam Constitutional:      General: She is not in acute distress.    Appearance: She is ill-appearing. She is not toxic-appearing.  Cardiovascular:     Rate and Rhythm: Tachycardia present.  Pulmonary:     Effort: No respiratory distress.     Comments: Supplemental oxygen  nasal cannula Abdominal:     General: Bowel sounds are normal. There is distension.     Palpations: Abdomen is soft.     Tenderness: There is no abdominal tenderness.  Neurological:     Mental Status: She is alert.     Labs: Recent Labs    07/04/24 0556 07/05/24 0719 07/06/24 0613  WBC 9.3 13.7* 10.3  HGB 8.9* 9.1* 8.4*  HCT 27.3* 28.8* 26.0*  PLT 294 363 355   BMET Recent Labs    07/04/24 0556 07/05/24 0719 07/06/24 0613  NA 133* 131* 129*  K 3.9 4.2 3.1*  CL 95* 94* 92*  CO2 25 21* 23  GLUCOSE 88 102* 118*  BUN 30* 39* 52*  CREATININE 0.92 1.05* 1.30*  CALCIUM  8.9 8.4* 8.1*   LFT Recent Labs    07/06/24 0613  PROT 6.5  ALBUMIN  2.6*  AST 23  ALT 12  ALKPHOS 55  BILITOT 1.0   PT/INR No results for input(s): LABPROT, INR in the last 72 hours. Diagnostic imaging: DG Abd 1 View Result Date: 07/06/2024 EXAM: 1 VIEW XRAY OF THE ABDOMEN 07/06/2024 02:38:00 AM COMPARISON: X-ray abdomen 07/02/2024, CT abdomen and pelvis 07/03/2024. CLINICAL HISTORY: Encounter for imaging study to confirm nasogastric (NG) tube placement. FINDINGS: LINES, TUBES AND DEVICES: Interval placement of  an enteric tube with tip and side port overlying the expected region of the gastric lumen. BOWEL: Persistent gaseous dilatation of the large bowel. SOFT TISSUES: No opaque urinary calculi. BONES: No acute osseous abnormality. IMPRESSION: 1. Persistent gaseous dilatation of the large bowel. 2. Intertrochanteric in good position. Electronically signed by: Morgane Naveau MD 07/06/2024 02:58 AM EDT RP Workstation: [id number]   DG CHEST PORT 1 VIEW Result Date: 07/05/2024 EXAM: 1 VIEW(S) XRAY OF THE CHEST 07/05/2024 04:17:14 PM COMPARISON: 06/29/2024 CLINICAL HISTORY: Shortness of breath 10026. Reason for exam - SHOB FINDINGS: LUNGS AND PLEURA: Streaky bibasilar opacities. No pulmonary edema. No pleural effusion. No pneumothorax. HEART AND MEDIASTINUM: Cardiomegaly, stable. Aortic atherosclerosis. BONES AND SOFT TISSUES: No acute osseous abnormality. Mild gaseous distention of bowel loops in included upper abdomen. IMPRESSION: 1. Streaky bibasilar opacities favor atelectasis . 2. Cardiomegaly, stable. Electronically signed by: Norman Gatlin MD 07/05/2024 04:50 PM EDT RP Workstation: [id number]   IMPRESSION: Colonic ileus, now with NG tube Right hip fracture, narcotics for analgesia History of chronic idiopathic constipation Chronic obstructive pulmonary disease  PLAN: -Monitor NG tube output -Linzess 290 mcg p.o. daily -Could also consider Movantik given concern for constipation stemming from narcotic use, though favor ileus as predominant driver of current symptoms -Monitor  bowel movements -Minimize narcotic use as able -[facility] will follow   LOS: 7 days   Estefana Keas, [facility]

## 2024-07-06 NOTE — Plan of Care (Signed)
   Problem: Education: Goal: Knowledge of General Education information will improve Description: Including pain rating scale, medication(s)/side effects and non-pharmacologic comfort measures Outcome: Progressing   Problem: Coping: Goal: Level of anxiety will decrease Outcome: Progressing   Problem: Safety: Goal: Ability to remain free from injury will improve Outcome: Progressing

## 2024-07-06 NOTE — Progress Notes (Signed)
 Physical Therapy Treatment  Patient Details Name: Marie Johnson MRN: [id number] DOB: 05-Feb-1951 Today's Date: 07/06/2024   History of Present Illness Pt is a 73 y/o female who presents 06/29/2024 s/p fall at home. She sustained a R periprosthetic femoral shaft fracture. Ortho consulted and plan is for non-op management; WBAT RLE. PMH significant for AAA, asthma, CAD, CA, CHF, COPD on 2L/min O2 at baseline, HTN, neuropathy, OA, osteoporosis, PVC's, PVD, fem-pop 2021, R rotator cuff repair.    PT Comments  Upon initial arrival, pt complaining of 8/10 pain and refusing to participate. Agreeable to receive pain meds and for PT to check back. Returned after pt had had pain meds and ~30 minutes to rest. Pt reports staff has told her she needs to rest today and not get up. RN cannot confirm this and no orders noted for bed rest. Max encouragement provided for pt to at least sit up on the EOB however pt continued to refuse. Pt was repositioned in the bed, as feet were resting on the footboard of the bed. Pt apologetic at end of session and reports she will get up tomorrow. Will continue to follow.   If plan is discharge home, recommend the following: Two people to help with walking and/or transfers;Two people to help with bathing/dressing/bathroom;Assistance with cooking/housework;Help with stairs or ramp for entrance;Assist for transportation   Can travel by private vehicle     No  Equipment Recommendations  Wheelchair (measurements PT);Wheelchair cushion (measurements PT)    Recommendations for Other Services       Precautions / Restrictions Precautions Precautions: Fall Recall of Precautions/Restrictions: Impaired Precaution/Restrictions Comments: Requires frequent cues Restrictions Weight Bearing Restrictions Per Provider Order: Yes RLE Weight Bearing Per Provider Order: Weight bearing as tolerated     Mobility  Bed Mobility Overal bed mobility: Needs Assistance              General bed mobility comments: Pt declining all attempts at sitting EOB. Noted pt had slid down in the bed and feet were resting on the footboard of the bed. Repositioned pt at [facility] with pt assisting with LLE and BUE's. +2 assist required for slide up with bed pads. Pt's RLE was positioned on a pillow for comfort at end of session.    Transfers                   General transfer comment: Pt declined    Ambulation/Gait                   Stairs             Wheelchair Mobility     Tilt Bed    Modified Rankin (Stroke Patients Only)       Balance Overall balance assessment: Needs assistance Sitting-balance support: Feet supported, No upper extremity supported Sitting balance-Leahy Scale: Fair     Standing balance support: Bilateral upper extremity supported, Reliant on assistive device for balance Standing balance-Leahy Scale: Poor                              Communication Communication Communication: No apparent difficulties  Cognition Arousal: Alert Behavior During Therapy: Anxious   PT - Cognitive impairments: No apparent impairments                         Following commands: Intact      Cueing Cueing Techniques: Verbal cues, Gestural  cues  Exercises      General Comments        Pertinent Vitals/Pain Pain Assessment Pain Assessment: 0-10 Pain Score: 8  Pain Location: R hip Pain Descriptors / Indicators: Grimacing, Guarding, Moaning, Aching Pain Intervention(s): Limited activity within patient's tolerance, Monitored during session, Repositioned, Patient requesting pain meds-RN notified    Home Living                          Prior Function            PT Goals (current goals can now be found in the care plan section) Acute Rehab PT Goals Patient Stated Goal: Decrease pain PT Goal Formulation: With patient Time For Goal Achievement: 07/15/24 Potential to Achieve Goals: Good Progress towards  PT goals: Progressing toward goals    Frequency    Min 2X/week      PT Plan      Co-evaluation              AM-PAC PT 6 Clicks Mobility   Outcome Measure  Help needed turning from your back to your side while in a flat bed without using bedrails?: Total Help needed moving from lying on your back to sitting on the side of a flat bed without using bedrails?: Total Help needed moving to and from a bed to a chair (including a wheelchair)?: Total Help needed standing up from a chair using your arms (e.g., wheelchair or bedside chair)?: Total Help needed to walk in hospital room?: Total Help needed climbing 3-5 steps with a railing? : Total 6 Click Score: 6    End of Session   Activity Tolerance: Patient limited by pain (Limited by anxiety) Patient left: in chair;with call bell/phone within reach;with chair alarm set Nurse Communication: Mobility status;Need for lift equipment Laurent) PT Visit Diagnosis: Pain;Difficulty in walking, not elsewhere classified (R26.2);History of falling (Z91.81) Pain - Right/Left: Right Pain - part of body: Hip     Time: 8564-8547 PT Time Calculation (min) (ACUTE ONLY): 17 min  Charges:    $Therapeutic Activity: 8-22 mins PT General Charges $$ ACUTE PT VISIT: 1 Visit                     Leita Sable, PT, DPT Acute Rehabilitation Services Secure Chat Preferred Office: [phone]    [name] 07/06/2024, 4:12 PM

## 2024-07-06 NOTE — Progress Notes (Signed)
 Patient insists on having ice chips, instructed her that diet ordered for her is strict NPO and that I have to clarify it first to hospitalist. Patient also insist on being put on diaper, educated her that per hospital policy, inpatient patients cannot be on pull ups and that they need to use bedpan. Patient raised her voice on this nurse stating she cannot use bedpan, of note patient can turn on side with 2 assist, also advised patient that pull ups are only used in out patient setting in this facility. Lynwood Kipper updated on all patient's demands. Diet changed to may take ice chips but as per policy, patient cannot use pull ups.

## 2024-07-06 NOTE — Progress Notes (Addendum)
       Overnight   NAME: Marie Johnson MRN: [id number] DOB : 08-03-1951    Date of Service   07/06/2024   HPI/Events of Note    Notified by RN for nausea vomiting.  73 year old female medical history of COPD on chronic home oxygen , CAD, PAD, hypertension, HLD, bilateral hip replacements.  Admitted through ER with mechanical fall after she tripped on her oxygen  tubing resulting in right hip pain. Reported as no change in LOC  Also found to have abdominal distention noted in attending plan is colonic ileus. Original KUB on 1016 with diffuse bowel gas distention throughout the abdomen concerning for colonic ileus. Bowel regimen ordered prior.  GI had been consulted.  GI notes bowel regimen but has not had any bowel movements and passes only minimal gas with suppositories. Also noted as distended abdomen, diastases recti, sluggish bowel sounds.  GI assessed as colonic ileus, worsened by continued need for narcotics and immobility.  Notified by RN for vomiting of brown particulate liquid and maintained firm abdomen. Due to risk of aspiration, immobility, and GI assessment of colonic ileus, an NGT will be inserted with low intermittent suction.  KUB pending after successful insertion.   Interventions/ Plan   NGT to low intermittent suction KUB after if successful insertion Continue all previous attending and consult orders.       Update 0312 hrs Imaging in part:  FINDINGS:   LINES, TUBES AND DEVICES: Interval placement of an enteric tube with tip and side port overlying the expected region of the gastric lumen.   BOWEL: Persistent gaseous dilatation of the large bowel.   SOFT TISSUES: No opaque urinary calculi.   BONES: No acute osseous abnormality.   IMPRESSION: 1. Persistent gaseous dilatation of the large bowel. 2. Intertrochanteric in good position.   Electronically signed by: Morgane Naveau MD 07/06/2024 02:58 AM EDT RP Workstation: [id number]  RN  reports immediate return of 200cc of brown liquid, with continuing fluid evacuation as LIS cycles.SABRA  ----------------------------------------------------------- Update 0522 hrs  RN reports patient feels a lot better to a point she's finally able to sleep  Decompression continues with no further Vomiting or nausea.  Lynwood Kipper BSN MSNA MSN ACNPC-AG Acute Care Nurse Practitioner [facility]

## 2024-07-06 NOTE — Progress Notes (Signed)
 PROGRESS NOTE    Marie Johnson  FMW:[id number] DOB: Dec 21, 1950 DOA: 06/29/2024 PCP: Stephanie Charlene CROME, MD  Chief Complaint  Patient presents with   Fall    Brief Narrative:   Marie Johnson is Marie Johnson 73 y.o. female with medical history significant of COPD on chronic 2 L CAD, PAD, HTN ,HLD, bilateral hip replacements presented with mechanical fall after she tripped on her oxygen  tubing resulting in right hip pain. Denies head trauma, no LOC.  In the ED, vital signs fairly stable, saturating well on 2 L of O2 (at baseline), labs noted with WBC 10.6, hemoglobin 9.3, otherwise unremarkable.  Orthopedics consulted.  Admitted for further management.  Assessment & Plan:   Principal Problem:   Closed right hip fracture (HCC) Active Problems:   Coronary artery disease involving native coronary artery of native heart   COPD (chronic obstructive pulmonary disease) (HCC)   Hypertension   Chronic diastolic CHF (congestive heart failure) (HCC)   Tobacco abuse   Hyperlipidemia   Malnutrition of moderate degree   Closed fracture of shaft of right femur (HCC)  Acute periprosthetic fracture of the proximal right femur  Mechanical fall CT showed periprosthetic fracture of proximal R femur, ill defined subcutaneous hematoma posterolaterally Orthopedics consulted  Pain management - oxy prn (increased, on chronic opiates at home).  Scheduled APAP.  Robaxin. Fall precautions   Abdominal Distension Colonic Ileus  KUB 10/16 with diffuse bowel gas distension throughout the abdomen - concerning for colonic ileus CT with gaseous distention of colon without discrete focal transition point or mass lesion Appreciate GI assistance - aggressive bowel regimen KUB 10/20 am with persistent gaseous dilation of large bowel  Required NG tube placement 10/19-20 PM for N/V with brown particulate liquid - NPO for now, will follow further GI recs  Hx diastasis recti  Bowel regimen  Acute on Chronic hypoxic  respiratory failure COPD Appears stable at baseline Currently on 5 L with acute episode of SOB yesterday witnessed by RN - was reportedly wheezing and hypoxic to 82 She's improved with nebs, no wheezing today - suspect multifactorial with COPD and abdominal distension related to above Continue scheduled and prn nebs - holding steroids for now with her lack of wheezing CXR 10/19 with streaky bibasilar opacities (atelectasis)    Normocytic anemia Anemia of chronic disease Vitamin B12 deficiency Baseline hemoglobin around 10-11 Hb relatively stable around 8-9 past few days No evidence of significant bleeding, although CT of hip showed some ill-defined subcutaneous hematoma Anemia panel WNL, except for vitamin B12--> 371 Vitamin B12 supplementation Trend CBC, monitor closely   Hypokalemia Replace and follow  Hypomagnesemia Replaced as needed   CAD  PAD Hyperlipidemia  s/p LHC 03/2023 with mild - moderate non obstructive disease - recommended ASA daily at that time Apparently recently stopped plavix  2 weeks ago?  Seems like indication for DAPT was PAD (09/2022 vascular note recommends DAPT) Aspirin   Continue Lipitor    Chronic diastolic HF Currently appears euvolemic Repeat echo with EF 60 to 65%, no regional wall motion abnormality Hold home Lasix , Aldactone  for now, continue with verapamil  and monitor closely   Hypertension BP ok today Lasix  and aldactone  on hold Verapamil    Gout allopurinol    Abnormal CT Scan Findings concerning for organizing pneumonia in R lung base Nodular thickening of adrenal glands Needs follow up imaging / outpatient follow up       DVT prophylaxis: SCD -> lovenox  Code Status: full Family Communication: none Disposition:   Status is: Inpatient  Remains inpatient appropriate because: need for continued inpatient care   Consultants:  ortho  Procedures:  none  Antimicrobials:  Anti-infectives (From admission, onward)    None        Subjective: Feels better with NG tube  Objective: Vitals:   07/06/24 0224 07/06/24 0407 07/06/24 0757 07/06/24 0902  BP:  136/62  135/71  Pulse: 90 84  89  Resp: 20   17  Temp:  98.3 F (36.8 C)  97.9 F (36.6 C)  TempSrc:  Oral  Oral  SpO2: 93% 96% 92% 95%  Weight:      Height:        Intake/Output Summary (Last 24 hours) at 07/06/2024 0940 Last data filed at 07/05/2024 1500 Gross per 24 hour  Intake 240 ml  Output 200 ml  Net 40 ml    Filed Weights   06/30/24 1100 07/03/24 2004  Weight: 61.2 kg 61.2 kg    Examination:  General: No acute distress. Cardiovascular: RRR Lungs: Clear to auscultation bilaterally - no increased wob - no wheezing Abdomen: less tight today, but still distended Neurological: Alert and oriented 3. Moves all extremities 4 with equal strength. Cranial nerves II through XII grossly intact. Extremities: No clubbing or cyanosis. No edema.   Data Reviewed: I have personally reviewed following labs and imaging studies  CBC: Recent Labs  Lab 07/01/24 0451 07/02/24 0506 07/03/24 0501 07/04/24 0556 07/05/24 0719 07/06/24 0613  WBC 8.3 9.1 8.3 9.3 13.7* 10.3  NEUTROABS 6.8  --  6.9 7.9* 12.2* 8.7*  HGB 8.9* 8.8* 8.8* 8.9* 9.1* 8.4*  HCT 27.3* 27.2* 27.1* 27.3* 28.8* 26.0*  MCV 101.1* 100.0 100.7* 101.1* 101.4* 98.5  PLT 267 254 248 294 363 355    Basic Metabolic Panel: Recent Labs  Lab 07/01/24 0451 07/02/24 0506 07/03/24 0501 07/04/24 0556 07/05/24 0719 07/06/24 0613  NA 134* 136 135 133* 131* 129*  K 4.0 3.4* 3.6 3.9 4.2 3.1*  CL 97* 98 98 95* 94* 92*  CO2 26 26 26 25  21* 23  GLUCOSE 122* 121* 118* 88 102* 118*  BUN 23 22 22  30* 39* 52*  CREATININE 0.97 0.76 0.76 0.92 1.05* 1.30*  CALCIUM  9.3 9.1 8.9 8.9 8.4* 8.1*  MG 1.9  --  2.0 2.2 2.2 2.3  PHOS  --  4.3 3.7 5.3* 4.5 5.0*    GFR: Estimated Creatinine Clearance: 36.1 mL/min (Marie Johnson) (by C-G formula based on SCr of 1.3 mg/dL (H)).  Liver Function Tests: Recent  Labs  Lab 07/01/24 0451 07/03/24 0501 07/04/24 0556 07/05/24 0719 07/06/24 0613  AST 18 17 19 20 23   ALT 11 11 12 10 12   ALKPHOS 63 58 58 60 55  BILITOT 1.0 1.1 1.4* 1.1 1.0  PROT 6.8 6.8 6.7 6.8 6.5  ALBUMIN  3.3* 3.0* 2.9* 2.9* 2.6*    CBG: No results for input(s): GLUCAP in the last 168 hours.   No results found for this or any previous visit (from the past 240 hours).       Radiology Studies: DG Abd 1 View Result Date: 07/06/2024 EXAM: 1 VIEW XRAY OF THE ABDOMEN 07/06/2024 02:38:00 AM COMPARISON: X-ray abdomen 07/02/2024, CT abdomen and pelvis 07/03/2024. CLINICAL HISTORY: Encounter for imaging study to confirm nasogastric (NG) tube placement. FINDINGS: LINES, TUBES AND DEVICES: Interval placement of an enteric tube with tip and side port overlying the expected region of the gastric lumen. BOWEL: Persistent gaseous dilatation of the large bowel. SOFT TISSUES: No opaque urinary calculi. BONES: No acute  osseous abnormality. IMPRESSION: 1. Persistent gaseous dilatation of the large bowel. 2. Intertrochanteric in good position. Electronically signed by: Morgane Naveau MD 07/06/2024 02:58 AM EDT RP Workstation: [id number]   DG CHEST PORT 1 VIEW Result Date: 07/05/2024 EXAM: 1 VIEW(S) XRAY OF THE CHEST 07/05/2024 04:17:14 PM COMPARISON: 06/29/2024 CLINICAL HISTORY: Shortness of breath 10026. Reason for exam - SHOB FINDINGS: LUNGS AND PLEURA: Streaky bibasilar opacities. No pulmonary edema. No pleural effusion. No pneumothorax. HEART AND MEDIASTINUM: Cardiomegaly, stable. Aortic atherosclerosis. BONES AND SOFT TISSUES: No acute osseous abnormality. Mild gaseous distention of bowel loops in included upper abdomen. IMPRESSION: 1. Streaky bibasilar opacities favor atelectasis . 2. Cardiomegaly, stable. Electronically signed by: Norman Gatlin MD 07/05/2024 04:50 PM EDT RP Workstation: [id number]          Scheduled Meds:  allopurinol   300 mg Oral QPM   arformoterol  15 mcg  Nebulization BID   aspirin  EC  81 mg Oral q AM   bisacodyl   10 mg Rectal BID   budesonide  (PULMICORT ) nebulizer solution  0.25 mg Nebulization BID   enoxaparin  (LOVENOX ) injection  40 mg Subcutaneous Daily   feeding supplement  237 mL Oral TID BM   ipratropium-albuterol   3 mL Nebulization Q6H   lactulose   30 g Oral TID   lidocaine   2 patch Transdermal Q24H   multivitamin with minerals  1 tablet Oral Daily   pantoprazole   40 mg Oral BID   polyethylene glycol  17 g Oral BID   sodium phosphate   1 enema Rectal Once   umeclidinium bromide   1 puff Inhalation Daily   verapamil   240 mg Oral Daily   Continuous Infusions:  acetaminophen      dextrose  5% lactated ringers  with KCl 20 mEq/L     potassium chloride        LOS: 7 days    Time spent: over 30 min     Meliton Monte, MD [facility]   To contact the attending provider between 7A-7P or the covering provider during after hours 7P-7A, please log into the web site [email or web address] and access using universal [facility] password for that web site. If you do not have the password, please call the hospital operator.  07/06/2024, 9:40 AM

## 2024-07-07 ENCOUNTER — Inpatient Hospital Stay (HOSPITAL_COMMUNITY)

## 2024-07-07 ENCOUNTER — Other Ambulatory Visit: Payer: Self-pay

## 2024-07-07 DIAGNOSIS — S72301A Unspecified fracture of shaft of right femur, initial encounter for closed fracture: Secondary | ICD-10-CM | POA: Diagnosis not present

## 2024-07-07 LAB — CBC WITH DIFFERENTIAL/PLATELET
Abs Immature Granulocytes: 0.34 K/uL — ABNORMAL HIGH (ref 0.00–0.07)
Basophils Absolute: 0 K/uL (ref 0.0–0.1)
Basophils Relative: 1 %
Eosinophils Absolute: 0 K/uL (ref 0.0–0.5)
Eosinophils Relative: 0 %
HCT: 26.8 % — ABNORMAL LOW (ref 36.0–46.0)
Hemoglobin: 8.5 g/dL — ABNORMAL LOW (ref 12.0–15.0)
Immature Granulocytes: 4 %
Lymphocytes Relative: 4 %
Lymphs Abs: 0.3 K/uL — ABNORMAL LOW (ref 0.7–4.0)
MCH: 31.5 pg (ref 26.0–34.0)
MCHC: 31.7 g/dL (ref 30.0–36.0)
MCV: 99.3 fL (ref 80.0–100.0)
Monocytes Absolute: 0.7 K/uL (ref 0.1–1.0)
Monocytes Relative: 8 %
Neutro Abs: 7.1 K/uL (ref 1.7–7.7)
Neutrophils Relative %: 83 %
Platelets: 384 K/uL (ref 150–400)
RBC: 2.7 MIL/uL — ABNORMAL LOW (ref 3.87–5.11)
RDW: 23.8 % — ABNORMAL HIGH (ref 11.5–15.5)
WBC: 8.5 K/uL (ref 4.0–10.5)
nRBC: 0.8 % — ABNORMAL HIGH (ref 0.0–0.2)

## 2024-07-07 LAB — COMPREHENSIVE METABOLIC PANEL WITH GFR
ALT: 14 U/L (ref 0–44)
AST: 31 U/L (ref 15–41)
Albumin: 2.4 g/dL — ABNORMAL LOW (ref 3.5–5.0)
Alkaline Phosphatase: 57 U/L (ref 38–126)
Anion gap: 6 (ref 5–15)
BUN: 31 mg/dL — ABNORMAL HIGH (ref 8–23)
CO2: 29 mmol/L (ref 22–32)
Calcium: 8.3 mg/dL — ABNORMAL LOW (ref 8.9–10.3)
Chloride: 96 mmol/L — ABNORMAL LOW (ref 98–111)
Creatinine, Ser: 0.76 mg/dL (ref 0.44–1.00)
GFR, Estimated: >60 mL/min (ref >60)
Glucose, Bld: 108 mg/dL — ABNORMAL HIGH (ref 70–99)
Potassium: 5.3 mmol/L — ABNORMAL HIGH (ref 3.5–5.1)
Sodium: 131 mmol/L — ABNORMAL LOW (ref 135–145)
Total Bilirubin: 1.1 mg/dL (ref 0.0–1.2)
Total Protein: 6.4 g/dL — ABNORMAL LOW (ref 6.5–8.1)

## 2024-07-07 LAB — MAGNESIUM: Magnesium: 2.5 mg/dL — ABNORMAL HIGH (ref 1.7–2.4)

## 2024-07-07 LAB — PHOSPHORUS: Phosphorus: 2.2 mg/dL — ABNORMAL LOW (ref 2.5–4.6)

## 2024-07-07 LAB — POTASSIUM: Potassium: 4.7 mmol/L (ref 3.5–5.1)

## 2024-07-07 MED ORDER — LINACLOTIDE 145 MCG PO CAPS: 290.0000 ug | ORAL_CAPSULE | Freq: Every day | ORAL | Status: DC

## 2024-07-07 MED ORDER — ACETAMINOPHEN 500 MG PO TABS
1000.0000 mg | ORAL_TABLET | Freq: Three times a day (TID) | ORAL | Status: DC
  Administered 2024-07-09 – 2024-07-12 (×4): 1000 mg via ORAL
  Filled 2024-07-07 (×10): qty 2

## 2024-07-07 MED ORDER — ACETAMINOPHEN 325 MG PO TABS: 650.0000 mg | ORAL_TABLET | Freq: Four times a day (QID) | ORAL | Status: DC | PRN

## 2024-07-07 MED ORDER — SODIUM CHLORIDE 0.9 % IV SOLN
3.0000 g | Freq: Four times a day (QID) | INTRAVENOUS | Status: AC
  Administered 2024-07-07 – 2024-07-11 (×17): 3 g via INTRAVENOUS
  Filled 2024-07-07 (×17): qty 8

## 2024-07-07 MED ORDER — DEXTROSE IN LACTATED RINGERS 5 % IV SOLN: INTRAVENOUS | Status: AC

## 2024-07-07 MED ORDER — PHENOL 1.4 % MT LIQD
1.0000 | OROMUCOSAL | Status: DC | PRN
  Administered 2024-07-07: 1 via OROMUCOSAL
  Filled 2024-07-07: qty 177

## 2024-07-07 NOTE — TOC Progression Note (Signed)
 Transition of Care [facility]) - Progression Note    Patient Details  Name: Marie Johnson MRN: [id number] Date of Birth: 1951-08-29  [facility] CM/SW Contact  Bridget Cordella Simmonds, LCSW Phone Number: 07/07/2024, 1:37 PM  Clinical Narrative:   ICM continues to follow for DC needs.  Will DC to [facility] for STR when stable.   [facility] aware of delay.     Expected Discharge Plan: Skilled Nursing Facility Barriers to Discharge: Continued Medical Work up, SNF Pending bed offer               Expected Discharge Plan and Services In-house Referral: Clinical Social Work   Post Acute Care Choice: Skilled Nursing Facility Living arrangements for the past 2 months: Single Family Home                                       Social Drivers of Health (SDOH) Interventions SDOH Screenings   Food Insecurity: No Food Insecurity (06/29/2024)  Housing: High Risk (06/29/2024)  Transportation Needs: No Transportation Needs (06/29/2024)  Utilities: Not At Risk (06/29/2024)  Depression (PHQ2-9): Low Risk  (05/02/2023)  Social Connections: Moderately Isolated (06/29/2024)  Tobacco Use: Medium Risk (06/29/2024)    Readmission Risk Interventions    06/06/2023   12:35 PM  Readmission Risk Prevention Plan  Transportation Screening Complete  PCP or Specialist Appt within 5-7 Days Complete  Home Care Screening Complete  Medication Review (RN CM) Complete

## 2024-07-07 NOTE — Progress Notes (Signed)
 [facility] Progress Note  SUBJECTIVE:   Interval history: Marie Johnson was seen and evaluated today at bedside. Resting in bed, appears to be with increased work of breathing. She noted passing flatus. She noted having large bowel movement, liquid, since last evaluation (I do not see this documented, will communicate with nursing). She has NG tube in place, nursing notes reviewed, roughly 100 cc in collection container at time of my evaluation (250 cc in last 24 hours).   Past Medical History:  Diagnosis Date   AAA (abdominal aortic aneurysm)    3.4 cm   Anemia    Anxiety    Aortic atherosclerosis    Arthritis    Asthma    CAD (coronary artery disease)    a. Nonobstructive CAD by cath 07/17/13.   [facility]    Carotid artery disease    Carotid US  8/22: Bilateral ICA 1-39; right subclavian stenosis; right thyroid  nodule   CHD (congenital heart disease)    pt unaware???   Chest pain    a. Adm 10-07/2013 - CTA neg for PE/dissection, cath with nonobstructive disease, CP ?due to uncontrolled HTN vs coronary vasospasm.   CHF (congestive heart failure) (HCC)    Closed head injury with concussion    COPD (chronic obstructive pulmonary disease) (HCC)    Coronary artery disease    Echocardiogram    Echocardiogram 06/2019: EF 60-65, impaired relaxation (Gr 1 DD), mild MAC, mild MR, mild to mod aortic valve sclerosis (no AS), trivial TR, mild LAE, normal RVSF, RVSP 30.7 (mildly elevated)   GERD (gastroesophageal reflux disease)    History of blood transfusion    childbirth, Hysterectomy   Hyperlipidemia    Hypertension    Neuropathy    OA (osteoarthritis)    Osteoporosis    PAD (peripheral artery disease)    a. s/p L fem-pop bypass b. recent evaluation at [facility], unamenable to intervention   PAD (peripheral artery disease)    Palpitations 02/22/2016   Pneumonia    Pre-diabetes    Premature ventricular contraction    PUD (peptic ulcer disease)    PVD (peripheral  vascular disease) 07/17/2013   Thoracic aortic aneurysm    Chest/aorta CTA 04/2022: 14 x 8 focal saccular aneurysm versus penetrating atherosclerotic ulcer involving left side of proximal portion of transverse aortic arch; descending thoracic aorta and proximal abdominal aorta aneurysm 4.4 cm; aortic atherosclerosis; emphysema   Tobacco abuse    Wears dentures    Wears glasses    Past Surgical History:  Procedure Laterality Date   ABDOMINAL AORTOGRAM W/LOWER EXTREMITY N/A 03/09/2020   Procedure: ABDOMINAL AORTOGRAM W/LOWER EXTREMITY;  Surgeon: Gretta Lonni PARAS, MD;  Location: [facility] INVASIVE [facility] LAB;  Service: Cardiovascular;  Laterality: N/A;   ABDOMINAL AORTOGRAM W/LOWER EXTREMITY N/A 07/26/2022   Procedure: ABDOMINAL AORTOGRAM W/LOWER EXTREMITY;  Surgeon: Gretta Lonni PARAS, MD;  Location: [facility] INVASIVE CV LAB;  Service: Cardiovascular;  Laterality: N/A;   ABDOMINAL AORTOGRAM W/LOWER EXTREMITY N/A 09/20/2022   Procedure: ABDOMINAL AORTOGRAM W/LOWER EXTREMITY;  Surgeon: Gretta Lonni PARAS, MD;  Location: [facility]VASIVE CV LAB;  Service: Cardiovascular;  Laterality: N/A;   ABDOMINAL HYSTERECTOMY     BREAST SURGERY     left cyst excision x 2   CARDIAC CATHETERIZATION     several years ago, nonobstructive, 50-60% blockages   DIRECT LARYNGOSCOPY Bilateral 03/13/2023   Procedure: MICRO DIRECT LARYNGOSCOPY WITH BIOPSY;  Surgeon: Carlie Clark, MD;  Location: [facility] OR;  Service: ENT;  Laterality: Bilateral;   ENDARTERECTOMY  FEMORAL Right 03/18/2020   Procedure: ENDARTERECTOMY FEMORAL WITH PROFUNDAPLASTY;  Surgeon: Gretta Lonni PARAS, MD;  Location: [facility] OR;  Service: Vascular;  Laterality: Right;   FEMORAL-POPLITEAL BYPASS GRAFT Right 03/18/2020   Procedure: BYPASS GRAFT FEMORAL-POPLITEAL ARTERY using GORE PROPATEN VASCULAR GRAFT;  Surgeon: Gretta Lonni PARAS, MD;  Location: [facility] OR;  Service: Vascular;  Laterality: Right;   HIP SURGERY     left - bars and screws placed   HIP SURGERY     INSERTION OF  ILIAC STENT Right 03/18/2020   Procedure: INSERTION OF GORE VIABAHN  ILIAC STENT;  Surgeon: Gretta Lonni PARAS, MD;  Location: [facility] OR;  Service: Vascular;  Laterality: Right;   LEFT HEART CATH AND CORONARY ANGIOGRAPHY N/A 04/08/2023   Procedure: LEFT HEART CATH AND CORONARY ANGIOGRAPHY;  Surgeon: Anner Alm ORN, MD;  Location: [facility] INVASIVE CV LAB;  Service: Cardiovascular;  Laterality: N/A;   LEFT HEART CATHETERIZATION WITH CORONARY ANGIOGRAM N/A 07/17/2013   Procedure: LEFT HEART CATHETERIZATION WITH CORONARY ANGIOGRAM;  Surgeon: Ezra GORMAN Shuck, MD;  Location: [facility] CATH LAB;  Service: Cardiovascular;  Laterality: N/A;   LOWER EXTREMITY ANGIOGRAM Right 03/18/2020   Procedure: RIGHT ILIAC ARTERIOGRAM WITH AN ILIAC STENT;  Surgeon: Gretta Lonni PARAS, MD;  Location: [facility] OR;  Service: Vascular;  Laterality: Right;   MULTIPLE TOOTH EXTRACTIONS     PATCH ANGIOPLASTY Right 03/18/2020   Procedure: PATCH ANGIOPLASTY using a GEORGE BIOLOGIC PATCH OF THE PROFUNDA;  Surgeon: Gretta Lonni PARAS, MD;  Location: [facility] OR;  Service: Vascular;  Laterality: Right;   PERIPHERAL VASCULAR CATHETERIZATION N/A 07/16/2016   Procedure: Abdominal Aortogram w/Lower Extremity;  Surgeon: Krystal JULIANNA Doing, MD;  Location: [facility] INVASIVE CV LAB;  Service: Cardiovascular;  Laterality: N/A;   PERIPHERAL VASCULAR INTERVENTION Left 03/09/2020   Procedure: PERIPHERAL VASCULAR INTERVENTION;  Surgeon: Gretta Lonni PARAS, MD;  Location: [facility]AS[facility] CV LAB;  Service: Cardiovascular;  Laterality: Left;  common and external illiac    ROTATOR CUFF REPAIR     right side, had torn ligaments as well   TUBAL LIGATION     VEIN BYPASS SURGERY     left leg   WRIST ARTHROCENTESIS     left - broke in 3 places so has bars and screws placed   Current Facility-Administered Medications  Medication Dose Route Frequency Provider Last Rate Last Admin   albuterol  (PROVENTIL ) (2.5 MG/3ML) 0.083% nebulizer solution 2.5 mg  2.5 mg Nebulization Q2H PRN Ezenduka,  Nkeiruka J, MD   2.5 mg at 07/06/24 1908   allopurinol  (ZYLOPRIM ) tablet 300 mg  300 mg Oral QPM Ezenduka, Nkeiruka J, MD   300 mg at 07/05/24 1759   arformoterol (BROVANA) nebulizer solution 15 mcg  15 mcg Nebulization BID Perri DELENA Meliton Mickey., MD   15 mcg at 07/07/24 0730   aspirin  EC tablet 81 mg  81 mg Oral q AM Perri DELENA Meliton Mickey., MD       bisacodyl  (DULCOLAX) suppository 10 mg  10 mg Rectal BID Karki, Arya, MD   10 mg at 07/06/24 2117   budesonide  (PULMICORT ) nebulizer solution 0.25 mg  0.25 mg Nebulization BID Perri DELENA Meliton Mickey., MD   0.25 mg at 07/07/24 0730   dextrose  5 % in lactated ringers  infusion   Intravenous Continuous Perri DELENA Meliton Mickey., MD       enoxaparin  (LOVENOX ) injection 40 mg  40 mg Subcutaneous Daily Perri DELENA Meliton Mickey., MD   40 mg at 07/06/24 0905   feeding supplement (ENSURE PLUS HIGH  PROTEIN) liquid 237 mL  237 mL Oral TID BM Ezenduka, Nkeiruka J, MD   237 mL at 07/03/24 1019   HYDROmorphone  (DILAUDID ) injection 0.5-1 mg  0.5-1 mg Intravenous Q2H PRN Perri DELENA Meliton Mickey., MD   1 mg at 07/07/24 0448   ipratropium-albuterol  (DUONEB) 0.5-2.5 (3) MG/3ML nebulizer solution 3 mL  3 mL Nebulization Q6H Perri DELENA Meliton Mickey., MD   3 mL at 07/07/24 0730   lactulose  (CHRONULAC ) 10 GM/15ML solution 30 g  30 g Oral TID Karki, Arya, MD   30 g at 07/05/24 2340   lidocaine  (LIDODERM ) 5 % 2 patch  2 patch Transdermal Q24H Perri DELENA Meliton Mickey., MD   2 patch at 07/06/24 1026   melatonin tablet 3 mg  3 mg Oral QHS PRN Doutova, Anastassia, MD   3 mg at 07/03/24 2023   methocarbamol (ROBAXIN) tablet 500 mg  500 mg Oral Q6H PRN Doutova, Anastassia, MD   500 mg at 07/05/24 9373   Or   methocarbamol (ROBAXIN) injection 500 mg  500 mg Intravenous Q6H PRN Doutova, Anastassia, MD   500 mg at 07/06/24 2116   multivitamin with minerals tablet 1 tablet  1 tablet Oral Daily Ezenduka, Nkeiruka J, MD   1 tablet at 07/04/24 9096   ondansetron  (ZOFRAN ) injection 4 mg  4 mg Intravenous  Q6H PRN Daniels, James K, NP   4 mg at 07/05/24 2143   oxyCODONE  (Oxy IR/ROXICODONE ) immediate release tablet 10 mg  10 mg Oral Q4H PRN Perri DELENA Meliton Mickey., MD   10 mg at 07/05/24 0000   Or   oxyCODONE  (Oxy IR/ROXICODONE ) immediate release tablet 15 mg  15 mg Oral Q4H PRN Perri DELENA Meliton Mickey., MD   15 mg at 07/05/24 1804   pantoprazole  (PROTONIX ) injection 40 mg  40 mg Intravenous Q12H Perri DELENA Meliton Mickey., MD   40 mg at 07/06/24 2116   polyethylene glycol (MIRALAX  / GLYCOLAX ) packet 17 g  17 g Oral BID Perri DELENA Meliton Mickey., MD   17 g at 07/05/24 2340   simethicone (MYLICON) chewable tablet 80 mg  80 mg Oral Q6H PRN Chavez, Abigail, NP   80 mg at 07/06/24 2117   sodium phosphate  (FLEET) enema 1 enema  1 enema Rectal Once Karki, Arya, MD       umeclidinium bromide  (INCRUSE ELLIPTA ) 62.5 MCG/ACT 1 puff  1 puff Inhalation Daily Reome, Earle J, RPH   1 puff at 07/07/24 0730   verapamil  (CALAN -SR) CR tablet 240 mg  240 mg Oral Daily Doutova, Anastassia, MD   240 mg at 07/05/24 9146   Facility-Administered Medications Ordered in Other Encounters  Medication Dose Route Frequency Provider Last Rate Last Admin   DOBUTamine  (DOBUTREX ) 1,000 mcg/mL in dextrose  5% 250 mL infusion  30 mcg/kg/min Intravenous Titrated Maranda Leim DEL, MD       Allergies as of 06/29/2024 - Review Complete 06/29/2024  Allergen Reaction Noted   Bee venom Anaphylaxis and Other (See Comments) 07/17/2013   Insect extract Anaphylaxis and Other (See Comments) 03/08/2020   Farxiga  [dapagliflozin ] Nausea And Vomiting 06/11/2022   Lyrica [pregabalin] Nausea And Vomiting 06/11/2022   Neurontin [gabapentin] Nausea And Vomiting and Other (See Comments) 11/09/2021   Nsaids Nausea Only and Other (See Comments) 03/08/2020   Latex Rash and Other (See Comments) 07/12/2016   Review of Systems:  Review of Systems  Respiratory:  Positive for cough and shortness of breath.   Cardiovascular:  Negative for chest pain.   Gastrointestinal:  Positive for abdominal pain and diarrhea.    OBJECTIVE:   Temp:  [97.5 F (36.4 C)-98.6 F (37 C)] 98.6 F (37 C) (10/21 0748) Pulse Rate:  [82-88] 87 (10/21 0748) Resp:  [17-20] 17 (10/21 0748) BP: (134-152)/(64-70) 147/64 (10/21 0748) SpO2:  [92 %-100 %] 100 % (10/21 0748) Last BM Date : 07/06/24 Physical Exam Constitutional:      General: She is not in acute distress.    Appearance: She is ill-appearing. She is not toxic-appearing or diaphoretic.  Cardiovascular:     Rate and Rhythm: Normal rate.  Pulmonary:     Effort: Respiratory distress present.     Breath sounds: Normal breath sounds.     Comments: Supplemental oxygen  nasal cannula 4 liters Abdominal:     General: There is distension.     Palpations: Abdomen is soft.     Tenderness: There is no abdominal tenderness.  Neurological:     Mental Status: She is alert.     Labs: Recent Labs    07/05/24 0719 07/06/24 0613 07/06/24 1300 07/07/24 0445  WBC 13.7* 10.3  --  8.5  HGB 9.1* 8.4* 8.3* 8.5*  HCT 28.8* 26.0* 25.2* 26.8*  PLT 363 355  --  384   BMET Recent Labs    07/05/24 0719 07/06/24 0613 07/07/24 0445  NA 131* 129* 131*  K 4.2 3.1* 5.3*  CL 94* 92* 96*  CO2 21* 23 29  GLUCOSE 102* 118* 108*  BUN 39* 52* 31*  CREATININE 1.05* 1.30* 0.76  CALCIUM  8.4* 8.1* 8.3*   LFT Recent Labs    07/07/24 0445  PROT 6.4*  ALBUMIN  2.4*  AST 31  ALT 14  ALKPHOS 57  BILITOT 1.1   PT/INR No results for input(s): LABPROT, INR in the last 72 hours. Diagnostic imaging: DG Abd 1 View Result Date: 07/06/2024 EXAM: 1 VIEW XRAY OF THE ABDOMEN 07/06/2024 02:38:00 AM COMPARISON: X-ray abdomen 07/02/2024, CT abdomen and pelvis 07/03/2024. CLINICAL HISTORY: Encounter for imaging study to confirm nasogastric (NG) tube placement. FINDINGS: LINES, TUBES AND DEVICES: Interval placement of an enteric tube with tip and side port overlying the expected region of the gastric lumen. BOWEL:  Persistent gaseous dilatation of the large bowel. SOFT TISSUES: No opaque urinary calculi. BONES: No acute osseous abnormality. IMPRESSION: 1. Persistent gaseous dilatation of the large bowel. 2. Intertrochanteric in good position. Electronically signed by: Morgane Naveau MD 07/06/2024 02:58 AM EDT RP Workstation: [id number]   DG CHEST PORT 1 VIEW Result Date: 07/05/2024 EXAM: 1 VIEW(S) XRAY OF THE CHEST 07/05/2024 04:17:14 PM COMPARISON: 06/29/2024 CLINICAL HISTORY: Shortness of breath 10026. Reason for exam - SHOB FINDINGS: LUNGS AND PLEURA: Streaky bibasilar opacities. No pulmonary edema. No pleural effusion. No pneumothorax. HEART AND MEDIASTINUM: Cardiomegaly, stable. Aortic atherosclerosis. BONES AND SOFT TISSUES: No acute osseous abnormality. Mild gaseous distention of bowel loops in included upper abdomen. IMPRESSION: 1. Streaky bibasilar opacities favor atelectasis . 2. Cardiomegaly, stable. Electronically signed by: Norman Gatlin MD 07/05/2024 04:50 PM EDT RP Workstation: [id number]   IMPRESSION: Colonic ileus, now with NG tube Right hip fracture, narcotics for analgesia History of chronic idiopathic constipation Chronic obstructive pulmonary disease  PLAN: -Check abdominal xray, monitor bowel movements, continue Linzess for now -NPO, monitor NG tube output -Limit narcotics as able -[facility] will follow   LOS: 8 days   Estefana Keas, [facility]

## 2024-07-07 NOTE — Progress Notes (Signed)
 Nutrition Follow-up  DOCUMENTATION CODES:   Non-severe (moderate) malnutrition in context of chronic illness  INTERVENTION:  Initiate TPN via PICC, managed by Pharmacy  High risk of refeeding. Recommend 100 mg thiamine IV for 5 days with start of TPN. Monitor K+, Phos and Mg x 3 days. Replete outside TPN as needed.   NUTRITION DIAGNOSIS:   Moderate Malnutrition related to chronic illness as evidenced by energy intake < 75% for > or equal to 1 month, mild fat depletion, moderate muscle depletion. - ongoing    GOAL:   Patient will meet greater than or equal to 90% of their needs - not met    MONITOR:   PO intake, Supplement acceptance, Weight trends  REASON FOR ASSESSMENT:   Consult New TPN/TNA  ASSESSMENT:   medical history significant of COPD on chronic 2 L  CAD, PAD, HTN ,HLD, bilateral hip replacements presented with mechanical fall after she tripped on her oxygen  tubing resulting in right hip pain.  Consult received for TPN. Patient remains NPO for prolonged ileus, NGT to suction (250 ml x 24 hr). PICC line ordered. Pending repeat KUB today.   Labs: Sodium 131, K 5.3, Glu 108, BUN 31, GFR 43, Phos 2.2  Meds reviewed. PO meds held due to NPO status.   Output:  250 ml NGT output   NUTRITION - FOCUSED PHYSICAL EXAM:  Flowsheet Row Most Recent Value  Orbital Region Mild depletion  Upper Arm Region Moderate depletion  Thoracic and Lumbar Region Unable to assess  [pt with back pain]  Buccal Region Mild depletion  Temple Region Moderate depletion  Clavicle Bone Region Moderate depletion  Clavicle and Acromion Bone Region Moderate depletion  Scapular Bone Region Unable to assess  [pt with back pain]  Dorsal Hand Moderate depletion  Patellar Region Moderate depletion  Anterior Thigh Region Moderate depletion  Posterior Calf Region Mild depletion  Hair Reviewed  Eyes Reviewed  Mouth Reviewed  Skin Reviewed  Nails Reviewed    Diet Order:   Diet Order              Diet NPO time specified Except for: Ice Chips  Diet effective now                   EDUCATION NEEDS:   Education needs have been addressed  Skin:  Skin Assessment: Reviewed RN Assessment  Last BM:  PTA  Height:   Ht Readings from Last 1 Encounters:  07/03/24 5' 7.5 (1.715 m)    Weight:   Wt Readings from Last 1 Encounters:  07/03/24 61.2 kg    BMI:  Body mass index is 20.82 kg/m.  Estimated Nutritional Needs:   Kcal:  1530-1836 kcals  Protein:  61-91 grams  Fluid:  1.5-1.8L/d  Madalyn Potters, MS, RD, LDN Clinical Dietitian  Contact via secure chat. If unavailable, use group chat RD Inpatient.

## 2024-07-07 NOTE — Care Management Important Message (Signed)
 Important Message  Patient Details  Name: Marie Johnson MRN: [id number] Date of Birth: [birth year]   Important Message Given:  Yes - Medicare IM     Jon Cruel 07/07/2024, 4:17 PM

## 2024-07-07 NOTE — Progress Notes (Addendum)
 PROGRESS NOTE    Marie Johnson  FMW:[id number] DOB: 1951/04/21 DOA: 06/29/2024 PCP: Stephanie Charlene CROME, MD  Chief Complaint  Patient presents with   Fall    Brief Narrative:   Marie Johnson is Marie Johnson 73 y.o. female with medical history significant of COPD on chronic 2 L CAD, PAD, HTN ,HLD, bilateral hip replacements presented with mechanical fall after she tripped on her oxygen  tubing resulting in right hip pain.   She had acute periprosthetic fracture of the right femur.  Orthopedics recommending non op management.  Hospitalization c/b colonic ileus.  GI consulted.  Currently with NG in place.    Assessment & Plan:   Principal Problem:   Closed right hip fracture (HCC) Active Problems:   Coronary artery disease involving native coronary artery of native heart   COPD (chronic obstructive pulmonary disease) (HCC)   Hypertension   Chronic diastolic CHF (congestive heart failure) (HCC)   Tobacco abuse   Hyperlipidemia   Malnutrition of moderate degree   Closed fracture of shaft of right femur (HCC)  Acute periprosthetic fracture of the proximal right femur  Mechanical fall CT showed periprosthetic fracture of proximal R femur, ill defined subcutaneous hematoma posterolaterally Orthopedics consulted - see 10/14 note from Ozell Ned - recommending WBAT with RW, follow with [place] ortho at discharge - non operative management Pain management - oxy prn (increased, on chronic opiates at home).  Scheduled APAP.  Robaxin. Fall precautions   Abdominal Distension Colonic Ileus  KUB 10/16 with diffuse bowel gas distension throughout the abdomen - concerning for colonic ileus CT with gaseous distention of colon without discrete focal transition point or mass lesion Appreciate GI assistance - aggressive bowel regimen - linzess KUB 10/20 am with persistent gaseous dilation of large bowel  Repeat plain films pending BM yesterday NG tube in place, 250 cc out yesterday Considered  TPN today, maybe able to d/c NG soon, will hold for now Hx diastasis recti  Bowel regimen  Acute on Chronic hypoxic respiratory failure COPD Currently on more O2 than her baseline, rhonchi on exam today Currently on 5 L with acute episode of SOB 10/19 witnessed by RN - was reportedly wheezing and hypoxic to 82 She's improved with nebs, no wheezing today - suspect multifactorial with COPD and abdominal distension related to above Continue scheduled and prn nebs - holding steroids for now with her lack of wheezing CXR 10/19 with streaky bibasilar opacities (atelectasis) Repeat CXR 10/21 -> with patchy densities in R lung, small to moderate R effusion and small L effusion - will treat for aspiration pneumonia (note concern of organizing pneumonia on CT from 10/17, with clinical hx, suspect aspiration pneumonia, but consider repeat imaging after treatment or pulm c/s if not improving) Repeat CXR 10/22  Normocytic anemia Anemia of chronic disease Vitamin B12 deficiency Baseline hemoglobin around 10-11 Hb relatively stable around 8-9 past few days No evidence of significant bleeding, although CT of hip showed some ill-defined subcutaneous hematoma Anemia panel WNL, except for vitamin B12--> 371 Vitamin B12 supplementation Trend CBC, monitor closely   Hypokalemia Replace and follow Now hyperkalemic, trend  Hypomagnesemia Replaced as needed   CAD  PAD Hyperlipidemia  s/p LHC 03/2023 with mild - moderate non obstructive disease - recommended ASA daily at that time Apparently recently stopped plavix  2 weeks ago?  Seems like indication for DAPT was PAD (09/2022 vascular note recommends DAPT) Aspirin   Continue Lipitor    Chronic diastolic HF Currently appears euvolemic Repeat echo with EF 60  to 65%, no regional wall motion abnormality Hold home Lasix , Aldactone  for now   Hypertension BP ok today Lasix  and aldactone  on hold Holding verapamil    Gout allopurinol    Abnormal CT  Scan Findings concerning for organizing pneumonia in R lung base Nodular thickening of adrenal glands Needs follow up imaging / outpatient follow up       DVT prophylaxis: SCD -> lovenox  Code Status: full Family Communication: none Disposition:   Status is: Inpatient Remains inpatient appropriate because: need for continued inpatient care   Consultants:  ortho  Procedures:  none  Antimicrobials:  Anti-infectives (From admission, onward)    None       Subjective: C/o R hip pain   Objective: Vitals:   07/07/24 0040 07/07/24 0448 07/07/24 0732 07/07/24 0748  BP:  (!) 146/68  (!) 147/64  Pulse:  82 85 87  Resp:  20 18 17   Temp:  97.6 F (36.4 C)  98.6 F (37 C)  TempSrc:  Oral    SpO2: 96% 99% 92% 100%  Weight:      Height:        Intake/Output Summary (Last 24 hours) at 07/07/2024 1035 Last data filed at 07/07/2024 0458 Gross per 24 hour  Intake 641.7 ml  Output 250 ml  Net 391.7 ml    Filed Weights   06/30/24 1100 07/03/24 2004  Weight: 61.2 kg 61.2 kg    Examination:  General: No acute distress. Cardiovascular: RRR Lungs: rhonchi on exam today Abdomen: distended, softer than previous days - yellow fluid from NG Neurological: Alert and oriented 3. Moves all extremities 4 with equal strength. Cranial nerves II through XII grossly intact.. Extremities: No clubbing or cyanosis. No edema.  Data Reviewed: I have personally reviewed following labs and imaging studies  CBC: Recent Labs  Lab 07/03/24 0501 07/04/24 0556 07/05/24 0719 07/06/24 0613 07/06/24 1300 07/07/24 0445  WBC 8.3 9.3 13.7* 10.3  --  8.5  NEUTROABS 6.9 7.9* 12.2* 8.7*  --  7.1  HGB 8.8* 8.9* 9.1* 8.4* 8.3* 8.5*  HCT 27.1* 27.3* 28.8* 26.0* 25.2* 26.8*  MCV 100.7* 101.1* 101.4* 98.5  --  99.3  PLT 248 294 363 355  --  384    Basic Metabolic Panel: Recent Labs  Lab 07/03/24 0501 07/04/24 0556 07/05/24 0719 07/06/24 0613 07/07/24 0445  NA 135 133* 131* 129* 131*   K 3.6 3.9 4.2 3.1* 5.3*  CL 98 95* 94* 92* 96*  CO2 26 25 21* 23 29  GLUCOSE 118* 88 102* 118* 108*  BUN 22 30* 39* 52* 31*  CREATININE 0.76 0.92 1.05* 1.30* 0.76  CALCIUM  8.9 8.9 8.4* 8.1* 8.3*  MG 2.0 2.2 2.2 2.3 2.5*  PHOS 3.7 5.3* 4.5 5.0* 2.2*    GFR: Estimated Creatinine Clearance: 58.7 mL/min (by C-G formula based on SCr of 0.76 mg/dL).  Liver Function Tests: Recent Labs  Lab 07/03/24 0501 07/04/24 0556 07/05/24 0719 07/06/24 0613 07/07/24 0445  AST 17 19 20 23 31   ALT 11 12 10 12 14   ALKPHOS 58 58 60 55 57  BILITOT 1.1 1.4* 1.1 1.0 1.1  PROT 6.8 6.7 6.8 6.5 6.4*  ALBUMIN  3.0* 2.9* 2.9* 2.6* 2.4*    CBG: No results for input(s): GLUCAP in the last 168 hours.   No results found for this or any previous visit (from the past 240 hours).       Radiology Studies: US  EKG SITE RITE Result Date: 07/07/2024 If Site Rite image not attached,  placement could not be confirmed due to current cardiac rhythm.  DG Abd 1 View Result Date: 07/06/2024 EXAM: 1 VIEW XRAY OF THE ABDOMEN 07/06/2024 02:38:00 AM COMPARISON: X-ray abdomen 07/02/2024, CT abdomen and pelvis 07/03/2024. CLINICAL HISTORY: Encounter for imaging study to confirm nasogastric (NG) tube placement. FINDINGS: LINES, TUBES AND DEVICES: Interval placement of an enteric tube with tip and side port overlying the expected region of the gastric lumen. BOWEL: Persistent gaseous dilatation of the large bowel. SOFT TISSUES: No opaque urinary calculi. BONES: No acute osseous abnormality. IMPRESSION: 1. Persistent gaseous dilatation of the large bowel. 2. Intertrochanteric in good position. Electronically signed by: Morgane Naveau MD 07/06/2024 02:58 AM EDT RP Workstation: [id number]   DG CHEST PORT 1 VIEW Result Date: 07/05/2024 EXAM: 1 VIEW(S) XRAY OF THE CHEST 07/05/2024 04:17:14 PM COMPARISON: 06/29/2024 CLINICAL HISTORY: Shortness of breath 10026. Reason for exam - SHOB FINDINGS: LUNGS AND PLEURA: Streaky bibasilar  opacities. No pulmonary edema. No pleural effusion. No pneumothorax. HEART AND MEDIASTINUM: Cardiomegaly, stable. Aortic atherosclerosis. BONES AND SOFT TISSUES: No acute osseous abnormality. Mild gaseous distention of bowel loops in included upper abdomen. IMPRESSION: 1. Streaky bibasilar opacities favor atelectasis . 2. Cardiomegaly, stable. Electronically signed by: Norman Gatlin MD 07/05/2024 04:50 PM EDT RP Workstation: [id number]          Scheduled Meds:  allopurinol   300 mg Oral QPM   arformoterol  15 mcg Nebulization BID   aspirin  EC  81 mg Oral q AM   bisacodyl   10 mg Rectal BID   budesonide  (PULMICORT ) nebulizer solution  0.25 mg Nebulization BID   enoxaparin  (LOVENOX ) injection  40 mg Subcutaneous Daily   feeding supplement  237 mL Oral TID BM   ipratropium-albuterol   3 mL Nebulization Q6H   lactulose   30 g Oral TID   lidocaine   2 patch Transdermal Q24H   linaclotide  290 mcg Oral QAC breakfast   multivitamin with minerals  1 tablet Oral Daily   pantoprazole  (PROTONIX ) IV  40 mg Intravenous Q12H   polyethylene glycol  17 g Oral BID   sodium phosphate   1 enema Rectal Once   umeclidinium bromide   1 puff Inhalation Daily   verapamil   240 mg Oral Daily   Continuous Infusions:  dextrose  5% lactated ringers        LOS: 8 days    Time spent: over 30 min     Meliton Monte, MD [facility]   To contact the attending provider between 7A-7P or the covering provider during after hours 7P-7A, please log into the web site [email or web address] and access using universal [place] password for that web site. If you do not have the password, please call the hospital operator.  07/07/2024, 10:35 AM

## 2024-07-07 NOTE — Progress Notes (Signed)
 Occupational Therapy Treatment Patient Details Name: Marie Johnson MRN: [id number] DOB: [birth year] Today's Date: 07/07/2024   History of present illness Pt is a 73 y/o female who presents 06/29/2024 s/p fall at home. She sustained a R periprosthetic femoral shaft fracture. Ortho consulted and plan is for non-op management; WBAT RLE. PMH significant for AAA, asthma, CAD, CA, CHF, COPD on 2L/min O2 at baseline, HTN, neuropathy, OA, osteoporosis, PVC's, PVD, fem-pop 2021, R rotator cuff repair.   OT comments  Upon arrival, pt refusing OOB reporting she has had too much going on this afternoon. Pt however, in significant pain needing redirection from it. Provided max education regarding optimal position for stomach, lung, heart function and position of RLE at rest. Engaged in gown change and repositioning to chair position in bed. Will continue efforts to see pt. Patient will benefit from continued inpatient follow up therapy, <3 hours/day       If plan is discharge home, recommend the following:  Two people to help with walking and/or transfers;Two people to help with bathing/dressing/bathroom;Assistance with cooking/housework;Assist for transportation;Help with stairs or ramp for entrance   Equipment Recommendations  Other (comment) (defer)    Recommendations for Other Services      Precautions / Restrictions Precautions Precautions: Fall Recall of Precautions/Restrictions: Impaired Precaution/Restrictions Comments: Requires frequent cues Restrictions Weight Bearing Restrictions Per Provider Order: Yes RLE Weight Bearing Per Provider Order: Weight bearing as tolerated       Mobility Bed Mobility Overal bed mobility: Needs Assistance Bed Mobility: Supine to Sit     Supine to sit: Max assist, +2 for physical assistance, HOB elevated, Used rails     General bed mobility comments: Pt declining all attempts at sitting EOB. Noted pt had slid down in the bed and feet were resting on  the footboard of the bed. Repositioned pt at [facility] with pt assisting with LLE and BUE's. +2 assist required for slide up with bed pads. Pt's RLE was positioned on a pillow for comfort and pt positioned in chair position with HOB at 40. Pt reports improved comfort in this upright position.    Transfers                   General transfer comment: Pt declined     Balance                                           ADL either performed or assessed with clinical judgement   ADL Overall ADL's : Needs assistance/impaired     Grooming: Set up;Bed level;Wash/dry face           Upper Body Dressing : Minimal assistance;Bed level Upper Body Dressing Details (indicate cue type and reason): new gown       Toilet Transfer Details (indicate cue type and reason): pt refused                Extremity/Trunk Assessment              Vision       Perception     Praxis     Communication Communication Communication: No apparent difficulties   Cognition Arousal: Alert Behavior During Therapy: Anxious Cognition: No family/caregiver present to determine baseline  Following commands: Intact        Cueing   Cueing Techniques: Verbal cues, Gestural cues  Exercises      Shoulder Instructions       General Comments      Pertinent Vitals/ Pain       Pain Assessment Pain Assessment: Faces Faces Pain Scale: Hurts a little bit Pain Location: R hip Pain Descriptors / Indicators: Grimacing, Aching, Operative site guarding Pain Intervention(s): Limited activity within patient's tolerance, Monitored during session  Home Living                                          Prior Functioning/Environment              Frequency  Min 2X/week        Progress Toward Goals  OT Goals(current goals can now be found in the care plan section)  Progress towards OT goals: Not progressing toward  goals - comment (pt deferred mobility)  Acute Rehab OT Goals OT Goal Formulation: With patient Time For Goal Achievement: 07/15/24 Potential to Achieve Goals: Good ADL Goals Pt Will Perform Grooming: with set-up;sitting Pt Will Perform Lower Body Dressing: with min assist;with adaptive equipment;sit to/from stand;sitting/lateral leans Pt Will Transfer to Toilet: with mod assist;stand pivot transfer Additional ADL Goal #1: Pt will perform bed mobility with min A as a precursor to ADL  Plan      Co-evaluation    PT/OT/SLP Co-Evaluation/Treatment: Yes Reason for Co-Treatment: Complexity of the patient's impairments (multi-system involvement);For patient/therapist safety;To address functional/ADL transfers PT goals addressed during session: Mobility/safety with mobility;Balance;Proper use of DME OT goals addressed during session: ADL's and self-care      AM-PAC OT 6 Clicks Daily Activity     Outcome Measure   Help from another person eating meals?: A Little Help from another person taking care of personal grooming?: A Little Help from another person toileting, which includes using toliet, bedpan, or urinal?: Total Help from another person bathing (including washing, rinsing, drying)?: A Lot Help from another person to put on and taking off regular upper body clothing?: A Little Help from another person to put on and taking off regular lower body clothing?: Total 6 Click Score: 13    End of Session Equipment Utilized During Treatment: Oxygen   OT Visit Diagnosis: Unsteadiness on feet (R26.81);Muscle weakness (generalized) (M62.81);History of falling (Z91.81);Pain Pain - Right/Left: Right Pain - part of body: Hip   Activity Tolerance Patient tolerated treatment well   Patient Left in bed;with call bell/phone within reach;with bed alarm set   Nurse Communication Mobility status        Time: 8584-8572 OT Time Calculation (min): 12 min  Charges: OT General Charges $OT  Visit: 1 Visit  Marie Johnson, OTR/L [facility] Acute Rehabilitation Office: [phone]   Marie JONETTA Lebron 07/07/2024, 2:57 PM

## 2024-07-07 NOTE — Progress Notes (Signed)
 Physical Therapy Treatment  Patient Details Name: Marie Johnson MRN: [id number] DOB: 08-Jul-1951 Today's Date: 07/07/2024   History of Present Illness Pt is a 73 y/o female who presents 06/29/2024 s/p fall at home. She sustained a R periprosthetic femoral shaft fracture. Ortho consulted and plan is for non-op management; WBAT RLE. PMH significant for AAA, asthma, CAD, CA, CHF, COPD on 2L/min O2 at baseline, HTN, neuropathy, OA, osteoporosis, PVC's, PVD, fem-pop 2021, R rotator cuff repair.    PT Comments  Pt continues to decline OOB mobility. Seen in conjunction with OT to maximize potential for safe functional mobility. Max encouragement provided but pt continues to decline. Repositioned pt into chair position in the bed (HOB at 40). Pt reports improved comfort in this position. Will continue efforts but pt aware that PT will not be in to see her again until later in the week due to frequent refusals.    If plan is discharge home, recommend the following: Two people to help with walking and/or transfers;Two people to help with bathing/dressing/bathroom;Assistance with cooking/housework;Help with stairs or ramp for entrance;Assist for transportation   Can travel by private vehicle     No  Equipment Recommendations  Wheelchair (measurements PT);Wheelchair cushion (measurements PT)    Recommendations for Other Services       Precautions / Restrictions Precautions Precautions: Fall Recall of Precautions/Restrictions: Impaired Precaution/Restrictions Comments: Requires frequent cues Restrictions Weight Bearing Restrictions Per Provider Order: Yes RLE Weight Bearing Per Provider Order: Weight bearing as tolerated     Mobility  Bed Mobility Overal bed mobility: Needs Assistance Bed Mobility: Supine to Sit     Supine to sit: Max assist, +2 for physical assistance, HOB elevated, Used rails     General bed mobility comments: Pt declining all attempts at sitting EOB. Noted pt had  slid down in the bed and feet were resting on the footboard of the bed. Repositioned pt at [facility] with pt assisting with LLE and BUE's. +2 assist required for slide up with bed pads. Pt's RLE was positioned on a pillow for comfort and pt positioned in chair position with HOB at 40. Pt reports improved comfort in this upright position.    Transfers                   General transfer comment: Pt declined    Ambulation/Gait                   Stairs             Wheelchair Mobility     Tilt Bed    Modified Rankin (Stroke Patients Only)       Balance                                            Communication Communication Communication: No apparent difficulties  Cognition Arousal: Alert Behavior During Therapy: Anxious   PT - Cognitive impairments: No apparent impairments                         Following commands: Intact      Cueing Cueing Techniques: Verbal cues, Gestural cues  Exercises      General Comments        Pertinent Vitals/Pain Pain Assessment Pain Assessment: Faces Faces Pain Scale: Hurts a little bit Pain Location: R hip Pain Descriptors /  Indicators: Grimacing, Aching, Operative site guarding Pain Intervention(s): Limited activity within patient's tolerance, Monitored during session, Repositioned    Home Living                          Prior Function            PT Goals (current goals can now be found in the care plan section) Acute Rehab PT Goals Patient Stated Goal: Decrease pain PT Goal Formulation: With patient Time For Goal Achievement: 07/15/24 Potential to Achieve Goals: Good Progress towards PT goals: Not progressing toward goals - comment (Pt declining to participate with OOB mobility)    Frequency    Min 2X/week      PT Plan      Co-evaluation PT/OT/SLP Co-Evaluation/Treatment: Yes Reason for Co-Treatment: Complexity of the patient's impairments (multi-system  involvement);For patient/therapist safety;To address functional/ADL transfers PT goals addressed during session: Mobility/safety with mobility;Balance;Proper use of DME        AM-PAC PT 6 Clicks Mobility   Outcome Measure  Help needed turning from your back to your side while in a flat bed without using bedrails?: Total Help needed moving from lying on your back to sitting on the side of a flat bed without using bedrails?: Total Help needed moving to and from a bed to a chair (including a wheelchair)?: Total Help needed standing up from a chair using your arms (e.g., wheelchair or bedside chair)?: Total Help needed to walk in hospital room?: Total Help needed climbing 3-5 steps with a railing? : Total 6 Click Score: 6    End of Session Equipment Utilized During Treatment: Gait belt;Oxygen  Activity Tolerance: Other (comment) (Limited by anxiety, reports of frequent diarrhea) Patient left: in bed;with call bell/phone within reach;with bed alarm set Nurse Communication: Mobility status;Need for lift equipment ([place], Pt requesting coffee) PT Visit Diagnosis: Pain;Difficulty in walking, not elsewhere classified (R26.2);History of falling (Z91.81) Pain - Right/Left: Right Pain - part of body: Hip     Time: 8584-8571 PT Time Calculation (min) (ACUTE ONLY): 13 min  Charges:    $Therapeutic Activity: 8-22 mins PT General Charges $$ ACUTE PT VISIT: 1 Visit                     Marie Johnson, PT, DPT Acute Rehabilitation Services Secure Chat Preferred Office: [phone]    Marie Johnson 07/07/2024, 2:40 PM

## 2024-07-07 NOTE — Plan of Care (Signed)
  Problem: Clinical Measurements: Goal: Diagnostic test results will improve Outcome: Not Progressing   Problem: Clinical Measurements: Goal: Cardiovascular complication will be avoided Outcome: Not Progressing   Problem: Clinical Measurements: Goal: Respiratory complications will improve Outcome: Not Progressing   Problem: Nutrition: Goal: Adequate nutrition will be maintained Outcome: Not Progressing   Problem: Activity: Goal: Risk for activity intolerance will decrease Outcome: Not Progressing   Problem: Coping: Goal: Level of anxiety will decrease Outcome: Not Progressing   Problem: Elimination: Goal: Will not experience complications related to bowel motility Outcome: Not Progressing   Problem: Pain Managment: Goal: General experience of comfort will improve and/or be controlled Outcome: Not Progressing   Problem: Safety: Goal: Ability to remain free from injury will improve Outcome: Not Progressing   Problem: Skin Integrity: Goal: Risk for impaired skin integrity will decrease Outcome: Not Progressing

## 2024-07-08 ENCOUNTER — Inpatient Hospital Stay (HOSPITAL_COMMUNITY)

## 2024-07-08 ENCOUNTER — Other Ambulatory Visit: Payer: Self-pay

## 2024-07-08 DIAGNOSIS — S72001G Fracture of unspecified part of neck of right femur, subsequent encounter for closed fracture with delayed healing: Secondary | ICD-10-CM | POA: Diagnosis not present

## 2024-07-08 LAB — COMPREHENSIVE METABOLIC PANEL WITH GFR
ALT: 13 U/L (ref 0–44)
AST: 25 U/L (ref 15–41)
Albumin: 2.4 g/dL — ABNORMAL LOW (ref 3.5–5.0)
Alkaline Phosphatase: 58 U/L (ref 38–126)
Anion gap: 8 (ref 5–15)
BUN: 15 mg/dL (ref 8–23)
CO2: 25 mmol/L (ref 22–32)
Calcium: 8.2 mg/dL — ABNORMAL LOW (ref 8.9–10.3)
Chloride: 102 mmol/L (ref 98–111)
Creatinine, Ser: 0.56 mg/dL (ref 0.44–1.00)
GFR, Estimated: >60 mL/min (ref >60)
Glucose, Bld: 95 mg/dL (ref 70–99)
Potassium: 4.5 mmol/L (ref 3.5–5.1)
Sodium: 135 mmol/L (ref 135–145)
Total Bilirubin: 1 mg/dL (ref 0.0–1.2)
Total Protein: 6 g/dL — ABNORMAL LOW (ref 6.5–8.1)

## 2024-07-08 LAB — MAGNESIUM: Magnesium: 2.2 mg/dL (ref 1.7–2.4)

## 2024-07-08 LAB — CBC WITH DIFFERENTIAL/PLATELET
Abs Immature Granulocytes: 0.47 K/uL — ABNORMAL HIGH (ref 0.00–0.07)
Basophils Absolute: 0 K/uL (ref 0.0–0.1)
Basophils Relative: 1 %
Eosinophils Absolute: 0 K/uL (ref 0.0–0.5)
Eosinophils Relative: 0 %
HCT: 28.4 % — ABNORMAL LOW (ref 36.0–46.0)
Hemoglobin: 8.8 g/dL — ABNORMAL LOW (ref 12.0–15.0)
Immature Granulocytes: 5 %
Lymphocytes Relative: 5 %
Lymphs Abs: 0.4 K/uL — ABNORMAL LOW (ref 0.7–4.0)
MCH: 31.4 pg (ref 26.0–34.0)
MCHC: 31 g/dL (ref 30.0–36.0)
MCV: 101.4 fL — ABNORMAL HIGH (ref 80.0–100.0)
Monocytes Absolute: 0.9 K/uL (ref 0.1–1.0)
Monocytes Relative: 10 %
Neutro Abs: 7.1 K/uL (ref 1.7–7.7)
Neutrophils Relative %: 79 %
Platelets: 411 K/uL — ABNORMAL HIGH (ref 150–400)
RBC: 2.8 MIL/uL — ABNORMAL LOW (ref 3.87–5.11)
RDW: 24.1 % — ABNORMAL HIGH (ref 11.5–15.5)
Smear Review: NORMAL
WBC: 8.9 K/uL (ref 4.0–10.5)
nRBC: 0.6 % — ABNORMAL HIGH (ref 0.0–0.2)

## 2024-07-08 LAB — PHOSPHORUS: Phosphorus: 2.6 mg/dL (ref 2.5–4.6)

## 2024-07-08 MED ORDER — DEXTROSE IN LACTATED RINGERS 5 % IV SOLN: INTRAVENOUS | Status: AC

## 2024-07-08 MED ORDER — IPRATROPIUM-ALBUTEROL 0.5-2.5 (3) MG/3ML IN SOLN
3.0000 mL | Freq: Three times a day (TID) | RESPIRATORY_TRACT | Status: DC
  Administered 2024-07-09: 3 mL via RESPIRATORY_TRACT
  Filled 2024-07-08: qty 3

## 2024-07-08 NOTE — Progress Notes (Signed)
 Marie Johnson  FMW:[id number] DOB: [birth year] DOA: 06/29/2024 PCP: Stephanie Charlene CROME, MD    Brief Narrative:  73 year old with a history of COPD on 2 L nasal cannula oxygen , CAD, PAD, HTN, HLD, and prior bilateral hip replacements who presented to the ER 10/13 after a mechanical fall with left hip pain ultimately found to be due to an acute periprosthetic right femur fracture.  She has been evaluated by orthopedic surgery who recommended nonoperative management.  Her hospital stay has been complicated by colonic ileus.  Goals of Care:   Code Status: Full Code   DVT prophylaxis: enoxaparin  (LOVENOX ) injection 40 mg Start: 07/06/24 1000 SCDs Start: 06/29/24 2211   Interim Hx: Afebrile.  Vital signs stable.  Oxygen  saturation 94% on 4 to 5 L nasal cannula.  CXR today suggest stable findings and right and left lung fields per this MD evaluation.  The patient herself states she still feels terrible.  She reports significant nausea and abdominal pain but no vomiting.  She feels short of breath though her oxygen  saturations are stable during my visit.  Assessment & Plan:  Acute periprosthetic right femur fracture status post mechanical fall Orthopedics has recommended nonoperative management with WBAT with RW -patient to follow-up with her orthopedist in [place] after discharge  Colonic ileus Confirmed on KUB 10/16 -CT without evidence of a clear transition point or mass -GI has evaluated -continue aggressive bowel regimen -NG remains in place -to place PICC line and begin TNA for support today's  Chronic hypoxic respiratory failure -COPD On 2 L nasal cannula oxygen  support at home -requiring more oxygen  support than her baseline due to abdominal distention/decreased abdominal compliance  Possible aspiration pneumonitis Follow-up CXR 10/21 noted patchy densities in the right lung with a small to moderate right size effusion and a small left effusion -in setting of ileus it is suspected that  she has suffered an aspiration event -possible right lung base organizing pneumonia noted on CT scan -follow-up as outpatient after completing course of antibiotic  Anemia of chronic disease -B12 deficiency Baseline hemoglobin approximately 10-11 -no evidence of significant bleeding -B12 modestly low at 371 -hemoglobin stable  Hypokalemia Corrected with supplementation  Hypomagnesemia Corrected with supplementation  History of CAD, PAD, and HLD Status post [facility] July 2024 with mild to moderate nonobstructive disease -was previously on DAPT for PAD -continue aspirin  and Lipitor  -Plavix  reportedly stopped 2 weeks ago   Chronic diastolic CHF Euvolemic at present -TTE noted EF 60-65% with no WMA  HTN Blood pressure controlled at present  Gout Continue usual allopurinol   Nutrition Problem: Moderate Malnutrition Etiology: chronic illness Signs/Symptoms: energy intake < 75% for > or equal to 1 month, mild fat depletion, moderate muscle depletion Interventions: Ensure Enlive (each supplement provides 350kcal and 20 grams of protein), MVI   Family Communication: No family present at time of exam today Disposition:   Skilled Nursing-Short Term Rehab (<3 Hours/Day)07/07/2024 1436  Objective: Blood pressure (!) 146/72, pulse 87, temperature 97.8 F (36.6 C), resp. rate 16, height 5' 7.5 (1.715 m), weight 61.2 kg, SpO2 94%.  Intake/Output Summary (Last 24 hours) at 07/08/2024 0823 Last data filed at 07/08/2024 0600 Gross per 24 hour  Intake 980.49 ml  Output 950 ml  Net 30.49 ml   Filed Weights   06/30/24 1100 07/03/24 2004  Weight: 61.2 kg 61.2 kg    Examination: General: No acute respiratory distress Lungs: Clear to auscultation bilaterally without wheezes or crackles Cardiovascular: Regular rate and rhythm without murmur gallop  or rub normal S1 and S2 Abdomen: Diffusely tender, nondistended, soft, no appreciable mass Extremities: No significant cyanosis, clubbing, or edema  bilateral lower extremities  CBC: Recent Labs  Lab 07/06/24 0613 07/06/24 1300 07/07/24 0445 07/08/24 0531  WBC 10.3  --  8.5 8.9  NEUTROABS 8.7*  --  7.1 7.1  HGB 8.4* 8.3* 8.5* 8.8*  HCT 26.0* 25.2* 26.8* 28.4*  MCV 98.5  --  99.3 101.4*  PLT 355  --  384 411*   Basic Metabolic Panel: Recent Labs  Lab 07/06/24 0613 07/07/24 0445 07/07/24 1534 07/08/24 0531  NA 129* 131*  --  135  K 3.1* 5.3* 4.7 4.5  CL 92* 96*  --  102  CO2 23 29  --  25  GLUCOSE 118* 108*  --  95  BUN 52* 31*  --  15  CREATININE 1.30* 0.76  --  0.56  CALCIUM  8.1* 8.3*  --  8.2*  MG 2.3 2.5*  --  2.2  PHOS 5.0* 2.2*  --  2.6   GFR: Estimated Creatinine Clearance: 58.7 mL/min (by C-G formula based on SCr of 0.56 mg/dL).   Scheduled Meds:  acetaminophen   1,000 mg Oral Q8H   allopurinol   300 mg Oral QPM   arformoterol  15 mcg Nebulization BID   aspirin  EC  81 mg Oral q AM   bisacodyl   10 mg Rectal BID   budesonide  (PULMICORT ) nebulizer solution  0.25 mg Nebulization BID   enoxaparin  (LOVENOX ) injection  40 mg Subcutaneous Daily   feeding supplement  237 mL Oral TID BM   ipratropium-albuterol   3 mL Nebulization Q6H   lactulose   30 g Oral TID   lidocaine   2 patch Transdermal Q24H   multivitamin with minerals  1 tablet Oral Daily   pantoprazole  (PROTONIX ) IV  40 mg Intravenous Q12H   polyethylene glycol  17 g Oral BID   sodium phosphate   1 enema Rectal Once   umeclidinium bromide   1 puff Inhalation Daily   Continuous Infusions:  ampicillin-sulbactam (UNASYN) IV 200 mL/hr at 07/08/24 0600   dextrose  5% lactated ringers  75 mL/hr at 07/08/24 0600     LOS: 9 days   Reyes IVAR Moores, MD [facility] Office  [phone] Pager - Text Page per Tracey  If 7PM-7AM, please contact night-coverage per Amion 07/08/2024, 8:23 AM

## 2024-07-08 NOTE — Progress Notes (Signed)
 [facility] Progress Note  SUBJECTIVE:   Interval history: Marie Johnson was seen and evaluated today at bedside. Resting in bed, sleeping at time of my evaluation. Noted that she feels better today, her abdomen is less distended, she denied nausea. She did not have bowel movement today, though had one yesterday. NG tube remains in place, 400 cc output documented by nursing staff. Xray imaging reviewed from yesterday, largely unchanged from prior, though abdominal exam today encouraging.  Past Medical History:  Diagnosis Date   AAA (abdominal aortic aneurysm)    3.4 cm   Anemia    Anxiety    Aortic atherosclerosis    Arthritis    Asthma    CAD (coronary artery disease)    a. Nonobstructive CAD by cath 07/17/13.   Cancer [facility])    Carotid artery disease    Carotid US  8/22: Bilateral ICA 1-39; right subclavian stenosis; right thyroid  nodule   CHD (congenital heart disease)    pt unaware???   Chest pain    a. Adm 10-07/2013 - CTA neg for PE/dissection, cath with nonobstructive disease, CP ?due to uncontrolled HTN vs coronary vasospasm.   CHF (congestive heart failure) (HCC)    Closed head injury with concussion    COPD (chronic obstructive pulmonary disease) (HCC)    Coronary artery disease    Echocardiogram    Echocardiogram 06/2019: EF 60-65, impaired relaxation (Gr 1 DD), mild MAC, mild MR, mild to mod aortic valve sclerosis (no AS), trivial TR, mild LAE, normal RVSF, RVSP 30.7 (mildly elevated)   GERD (gastroesophageal reflux disease)    History of blood transfusion    childbirth, Hysterectomy   Hyperlipidemia    Hypertension    Neuropathy    OA (osteoarthritis)    Osteoporosis    PAD (peripheral artery disease)    a. s/p L fem-pop bypass b. recent evaluation at [facility], unamenable to intervention   PAD (peripheral artery disease)    Palpitations 02/22/2016   Pneumonia    Pre-diabetes    Premature ventricular contraction    PUD (peptic ulcer disease)    PVD  (peripheral vascular disease) 07/17/2013   Thoracic aortic aneurysm    Chest/aorta CTA 04/2022: 14 x 8 focal saccular aneurysm versus penetrating atherosclerotic ulcer involving left side of proximal portion of transverse aortic arch; descending thoracic aorta and proximal abdominal aorta aneurysm 4.4 cm; aortic atherosclerosis; emphysema   Tobacco abuse    Wears dentures    Wears glasses    Past Surgical History:  Procedure Laterality Date   ABDOMINAL AORTOGRAM W/LOWER EXTREMITY N/A 03/09/2020   Procedure: ABDOMINAL AORTOGRAM W/LOWER EXTREMITY;  Surgeon: Gretta Lonni PARAS, MD;  Location: [facility] INVASIVE CV LAB;  Service: Cardiovascular;  Laterality: N/A;   ABDOMINAL AORTOGRAM W/LOWER EXTREMITY N/A 07/26/2022   Procedure: ABDOMINAL AORTOGRAM W/LOWER EXTREMITY;  Surgeon: Gretta Lonni PARAS, MD;  Location: [facility]VAS[facility] CV [facility];  Service: Cardiovascular;  Laterality: N/A;   ABDOMINAL AORTOGRAM W/LOWER EXTREMITY N/A 09/20/2022   Procedure: ABDOMINAL AORTOGRAM W/LOWER EXTREMITY;  Surgeon: Gretta Lonni PARAS, MD;  Location: [facility]VAS[facility] CV LAB;  Service: Cardiovascular;  Laterality: N/A;   ABDOMINAL HYSTERECTOMY     BREAST SURGERY     left cyst excision x 2   CARDIAC CATHETERIZATION     several years ago, nonobstructive, 50-60% blockages   DIRECT LARYNGOSCOPY Bilateral 03/13/2023   Procedure: MICRO DIRECT LARYNGOSCOPY WITH BIOPSY;  Surgeon: Carlie Clark, MD;  Location: [facility] OR;  Service: ENT;  Laterality: Bilateral;   ENDARTERECTOMY FEMORAL Right  03/18/2020   Procedure: ENDARTERECTOMY FEMORAL WITH PROFUNDAPLASTY;  Surgeon: Gretta Lonni PARAS, MD;  Location: [facility] OR;  Service: Vascular;  Laterality: Right;   FEMORAL-POPLITEAL BYPASS GRAFT Right 03/18/2020   Procedure: BYPASS GRAFT FEMORAL-POPLITEAL ARTERY using GORE PROPATEN VASCULAR GRAFT;  Surgeon: Gretta Lonni PARAS, MD;  Location: [facility] OR;  Service: Vascular;  Laterality: Right;   HIP SURGERY     left - bars and screws placed   HIP SURGERY      INSERTION OF ILIAC STENT Right 03/18/2020   Procedure: INSERTION OF GORE VIABAHN  ILIAC STENT;  Surgeon: Gretta Lonni PARAS, MD;  Location: [facility] OR;  Service: Vascular;  Laterality: Right;   LEFT HEART CATH AND CORONARY ANGIOGRAPHY N/A 04/08/2023   Procedure: LEFT HEART CATH AND CORONARY ANGIOGRAPHY;  Surgeon: Anner Alm ORN, MD;  Location: [facility] INVASIVE CV LAB;  Service: Cardiovascular;  Laterality: N/A;   LEFT HEART CATHETERIZATION WITH CORONARY ANGIOGRAM N/A 07/17/2013   Procedure: LEFT HEART CATHETERIZATION WITH CORONARY ANGIOGRAM;  Surgeon: Ezra GORMAN Shuck, MD;  Location: [facility] CATH LAB;  Service: Cardiovascular;  Laterality: N/A;   LOWER EXTREMITY ANGIOGRAM Right 03/18/2020   Procedure: RIGHT ILIAC ARTERIOGRAM WITH AN ILIAC STENT;  Surgeon: Gretta Lonni PARAS, MD;  Location: [facility] OR;  Service: Vascular;  Laterality: Right;   MULTIPLE TOOTH EXTRACTIONS     PATCH ANGIOPLASTY Right 03/18/2020   Procedure: PATCH ANGIOPLASTY using a GEORGE BIOLOGIC PATCH OF THE PROFUNDA;  Surgeon: Gretta Lonni PARAS, MD;  Location: [facility] OR;  Service: Vascular;  Laterality: Right;   PERIPHERAL VASCULAR CATHETERIZATION N/A 07/16/2016   Procedure: Abdominal Aortogram w/Lower Extremity;  Surgeon: Krystal JULIANNA Doing, MD;  Location: [facility] INVASIVE CV LAB;  Service: Cardiovascular;  Laterality: N/A;   PERIPHERAL VASCULAR INTERVENTION Left 03/09/2020   Procedure: PERIPHERAL VASCULAR INTERVENTION;  Surgeon: Gretta Lonni PARAS, MD;  Location: [facility] INVASIVE CV LAB;  Service: Cardiovascular;  Laterality: Left;  common and external illiac    ROTATOR CUFF REPAIR     right side, had torn ligaments as well   TUBAL LIGATION     VEIN BYPASS SURGERY     left leg   WRIST ARTHROCENTESIS     left - broke in 3 places so has bars and screws placed   Current Facility-Administered Medications  Medication Dose Route Frequency Provider Last Rate Last Admin   acetaminophen  (TYLENOL ) tablet 1,000 mg  1,000 mg Oral Q8H Perri DELENA Meliton Mickey., MD        Followed by   NOREEN ON 07/14/2024] acetaminophen  (TYLENOL ) tablet 650 mg  650 mg Oral Q6H PRN Perri DELENA Meliton Mickey., MD       albuterol  (PROVENTIL ) (2.5 MG/3ML) 0.083% nebulizer solution 2.5 mg  2.5 mg Nebulization Q2H PRN Ezenduka, Nkeiruka J, MD   2.5 mg at 07/06/24 1908   allopurinol  (ZYLOPRIM ) tablet 300 mg  300 mg Oral QPM Ezenduka, Nkeiruka J, MD   300 mg at 07/05/24 1759   Ampicillin-Sulbactam (UNASYN) 3 g in sodium chloride  0.9 % 100 mL IVPB  3 g Intravenous Q6H Billy Rocky SAUNDERS, RPH 200 mL/hr at 07/08/24 0600 Infusion Verify at 07/08/24 0600   arformoterol (BROVANA) nebulizer solution 15 mcg  15 mcg Nebulization BID Perri DELENA Meliton Mickey., MD   15 mcg at 07/08/24 9273   aspirin  EC tablet 81 mg  81 mg Oral q AM Perri DELENA Meliton Mickey., MD       bisacodyl  (DULCOLAX) suppository 10 mg  10 mg Rectal BID Karki, Arya, MD   10 mg  at 07/06/24 2117   budesonide  (PULMICORT ) nebulizer solution 0.25 mg  0.25 mg Nebulization BID Perri DELENA Meliton Mickey., MD   0.25 mg at 07/08/24 9273   enoxaparin  (LOVENOX ) injection 40 mg  40 mg Subcutaneous Daily Perri DELENA Meliton Mickey., MD   40 mg at 07/08/24 9092   feeding supplement (ENSURE PLUS HIGH PROTEIN) liquid 237 mL  237 mL Oral TID BM Ezenduka, Nkeiruka J, MD   237 mL at 07/03/24 1019   HYDROmorphone  (DILAUDID ) injection 0.5-1 mg  0.5-1 mg Intravenous Q2H PRN Perri DELENA Meliton Mickey., MD   1 mg at 07/08/24 9092   ipratropium-albuterol  (DUONEB) 0.5-2.5 (3) MG/3ML nebulizer solution 3 mL  3 mL Nebulization Q6H Perri DELENA Meliton Mickey., MD   3 mL at 07/08/24 0726   lactulose  (CHRONULAC ) 10 GM/15ML solution 30 g  30 g Oral TID Karki, Arya, MD   30 g at 07/05/24 2340   lidocaine  (LIDODERM ) 5 % 2 patch  2 patch Transdermal Q24H Perri DELENA Meliton Mickey., MD   2 patch at 07/06/24 1026   melatonin tablet 3 mg  3 mg Oral QHS PRN Doutova, Anastassia, MD   3 mg at 07/03/24 2023   methocarbamol (ROBAXIN) tablet 500 mg  500 mg Oral Q6H PRN Doutova, Anastassia, MD   500 mg at  07/05/24 9373   Or   methocarbamol (ROBAXIN) injection 500 mg  500 mg Intravenous Q6H PRN Doutova, Anastassia, MD   500 mg at 07/06/24 2116   multivitamin with minerals tablet 1 tablet  1 tablet Oral Daily Ezenduka, Nkeiruka J, MD   1 tablet at 07/08/24 9093   ondansetron  (ZOFRAN ) injection 4 mg  4 mg Intravenous Q6H PRN Daniels, James K, NP   4 mg at 07/05/24 2143   oxyCODONE  (Oxy IR/ROXICODONE ) immediate release tablet 10 mg  10 mg Oral Q4H PRN Perri DELENA Meliton Mickey., MD   10 mg at 07/05/24 0000   Or   oxyCODONE  (Oxy IR/ROXICODONE ) immediate release tablet 15 mg  15 mg Oral Q4H PRN Perri DELENA Meliton Mickey., MD   15 mg at 07/07/24 1323   pantoprazole  (PROTONIX ) injection 40 mg  40 mg Intravenous Q12H Perri DELENA Meliton Mickey., MD   40 mg at 07/08/24 0906   phenol (CHLORASEPTIC) mouth spray 1 spray  1 spray Mouth/Throat PRN Perri DELENA Meliton Mickey., MD   1 spray at 07/07/24 2002   polyethylene glycol (MIRALAX  / GLYCOLAX ) packet 17 g  17 g Oral BID Perri DELENA Meliton Mickey., MD   17 g at 07/05/24 2340   simethicone (MYLICON) chewable tablet 80 mg  80 mg Oral Q6H PRN Chavez, Abigail, NP   80 mg at 07/06/24 2117   sodium phosphate  (FLEET) enema 1 enema  1 enema Rectal Once Karki, Arya, MD       umeclidinium bromide  (INCRUSE ELLIPTA ) 62.5 MCG/ACT 1 puff  1 puff Inhalation Daily Reome, Earle J, RPH   1 puff at 07/08/24 9271   Facility-Administered Medications Ordered in Other Encounters  Medication Dose Route Frequency Provider Last Rate Last Admin   DOBUTamine  (DOBUTREX ) 1,000 mcg/mL in dextrose  5% 250 mL infusion  30 mcg/kg/min Intravenous Titrated Maranda Leim DEL, MD       Allergies as of 06/29/2024 - Review Complete 06/29/2024  Allergen Reaction Noted   Bee venom Anaphylaxis and Other (See Comments) 07/17/2013   Insect extract Anaphylaxis and Other (See Comments) 03/08/2020   Farxiga  [dapagliflozin ] Nausea And Vomiting 06/11/2022   Lyrica [pregabalin] Nausea And Vomiting 06/11/2022  Neurontin  [gabapentin] Nausea And Vomiting and Other (See Comments) 11/09/2021   Nsaids Nausea Only and Other (See Comments) 03/08/2020   Latex Rash and Other (See Comments) 07/12/2016   Review of Systems:  Review of Systems  Respiratory:  Negative for shortness of breath.   Cardiovascular:  Negative for chest pain.  Gastrointestinal:  Positive for abdominal pain. Negative for nausea and vomiting.    OBJECTIVE:   Temp:  [97.6 F (36.4 C)-98.2 F (36.8 C)] 97.8 F (36.6 C) (10/22 0458) Pulse Rate:  [81-103] 87 (10/22 0728) Resp:  [16-19] 16 (10/22 0728) BP: (132-146)/(64-72) 146/72 (10/22 0458) SpO2:  [78 %-99 %] 94 % (10/22 0728) FiO2 (%):  [40 %] 40 % (10/21 2019) Last BM Date : 07/07/24 Physical Exam Constitutional:      General: She is not in acute distress.    Appearance: She is ill-appearing. She is not toxic-appearing.  Cardiovascular:     Rate and Rhythm: Normal rate.  Pulmonary:     Breath sounds: Normal breath sounds.  Abdominal:     General: Bowel sounds are normal. There is distension.     Palpations: Abdomen is soft.     Tenderness: There is no abdominal tenderness.  Neurological:     Mental Status: She is alert.     Labs: Recent Labs    07/06/24 0613 07/06/24 1300 07/07/24 0445 07/08/24 0531  WBC 10.3  --  8.5 8.9  HGB 8.4* 8.3* 8.5* 8.8*  HCT 26.0* 25.2* 26.8* 28.4*  PLT 355  --  384 411*   BMET Recent Labs    07/06/24 0613 07/07/24 0445 07/07/24 1534 07/08/24 0531  NA 129* 131*  --  135  K 3.1* 5.3* 4.7 4.5  CL 92* 96*  --  102  CO2 23 29  --  25  GLUCOSE 118* 108*  --  95  BUN 52* 31*  --  15  CREATININE 1.30* 0.76  --  0.56  CALCIUM  8.1* 8.3*  --  8.2*   LFT Recent Labs    07/08/24 0531  PROT 6.0*  ALBUMIN  2.4*  AST 25  ALT 13  ALKPHOS 58  BILITOT 1.0   PT/INR No results for input(s): LABPROT, INR in the last 72 hours. Diagnostic imaging: DG CHEST PORT 1 VIEW Result Date: 07/08/2024 EXAM: 1 VIEW(S) XRAY OF THE CHEST  07/08/2024 05:53:09 AM COMPARISON: 07/07/2024 CLINICAL HISTORY: Sob,pneumonia.hx COPD,CHF,ASTHMA; rover FINDINGS: LINES, TUBES AND DEVICES: Enteric tube is identified with tip coursing below the level of the GE junction. The side port appears to remain at the level of the GE junction consider advancing for optimal positioning. LUNGS AND PLEURA: Unchanged right lower lung airspace opacity. Small right pleural effusion. Trace left pleural effusion, unchanged. Similar appearance of mild increased peripheral septal markings compatible with edema. No pneumothorax. HEART AND MEDIASTINUM: Unchanged cardiomegaly. Atherosclerotic calcifications. BONES AND SOFT TISSUES: No acute osseous abnormality. IMPRESSION: 1. Enteric tube side port at the gastroesophageal junction; recommend advancement for optimal positioning. 2. Unchanged right lower lung airspace opacity and small right pleural effusion. 3. Trace left pleural effusion, unchanged. 4. Mild interstitial edema pattern. Electronically signed by: Waddell Calk MD 07/08/2024 07:24 AM EDT RP Workstation: [id number]   DG CHEST PORT 1 VIEW Result Date: 07/07/2024 CLINICAL DATA:  Rhonchi. EXAM: PORTABLE CHEST 1 VIEW COMPARISON:  07/05/2024 FINDINGS: Stable enlarged cardiac silhouette. Tortuous and partially calcified thoracic aorta. Interval nasogastric tube with its tip not included. The side hole is partially included and is in the region  of the distal esophagus. It is recommended that this be advanced 5 cm. Small to moderate sized right pleural effusion and small left pleural effusion. Mild increase in small patchy densities in the right lung. Stable chronic interstitial prominence and mild peribronchial thickening. Diffuse osteopenia. Moderate bilateral glenohumeral degenerative changes. IMPRESSION: 1. Interval nasogastric tube with its tip not included. The side hole is partially included and is in the region of the distal esophagus. It is recommended that this be  advanced 5 cm. 2. Mild increase in small patchy densities in the right lung, compatible with atypical pneumonia. 3. Stable cardiomegaly and chronic interstitial lung disease. 4. Small to moderate sized right pleural effusion and small left pleural effusion. Electronically Signed   By: Elspeth Bathe M.D.   On: 07/07/2024 16:27   DG Abd 1 View Result Date: 07/07/2024 CLINICAL DATA:  Ileus. EXAM: ABDOMEN - 1 VIEW COMPARISON:  Radiographs 07/06/2024 and 07/02/2024.  CT 07/03/2024. FINDINGS: 1133 hours. Two supine views of the abdomen are submitted. Enteric tube projects over the proximal stomach with the side hole near the gastroesophageal junction. No significant change in moderate diffuse bowel distension, most consistent with an ileus. No supine evidence of pneumoperitoneum. Extensive vascular calcifications noted status post iliac stenting. Previous bilateral total hip arthroplasty with grossly stable asymmetric configuration of the visualized left femoral component. IMPRESSION: No significant change in moderate diffuse bowel distension, most consistent with an ileus. Enteric tube projects over the proximal stomach. Electronically Signed   By: Elsie Perone M.D.   On: 07/07/2024 16:20   US  EKG SITE RITE Result Date: 07/07/2024 If Site Rite image not attached, placement could not be confirmed due to current cardiac rhythm.  IMPRESSION: Colonic ileus, NG tube Protein calorie malnutrition Right hip fracture, narcotics for analgesia History of chronic idiopathic constipation Chronic obstructive pulmonary disease  PLAN: -Holding Linzess in setting of ileus -NG tube in place, nausea improved -Abdominal exam encouraging today, monitor bowel movements -Per discussion with IM yesterday, may need to consider TPN at this juncture given prolonged lack of PO intake  -[facility] will follow   LOS: 9 days   Estefana Keas, [facility]

## 2024-07-09 DIAGNOSIS — K567 Ileus, unspecified: Secondary | ICD-10-CM

## 2024-07-09 DIAGNOSIS — S72001G Fracture of unspecified part of neck of right femur, subsequent encounter for closed fracture with delayed healing: Secondary | ICD-10-CM | POA: Diagnosis not present

## 2024-07-09 LAB — CBC
HCT: 28 % — ABNORMAL LOW (ref 36.0–46.0)
Hemoglobin: 8.6 g/dL — ABNORMAL LOW (ref 12.0–15.0)
MCH: 30.8 pg (ref 26.0–34.0)
MCHC: 30.7 g/dL (ref 30.0–36.0)
MCV: 100.4 fL — ABNORMAL HIGH (ref 80.0–100.0)
Platelets: 385 K/uL (ref 150–400)
RBC: 2.79 MIL/uL — ABNORMAL LOW (ref 3.87–5.11)
RDW: 24.4 % — ABNORMAL HIGH (ref 11.5–15.5)
WBC: 8.1 K/uL (ref 4.0–10.5)
nRBC: 0.2 % (ref 0.0–0.2)

## 2024-07-09 LAB — COMPREHENSIVE METABOLIC PANEL WITH GFR
ALT: 12 U/L (ref 0–44)
AST: 20 U/L (ref 15–41)
Albumin: 2.2 g/dL — ABNORMAL LOW (ref 3.5–5.0)
Alkaline Phosphatase: 52 U/L (ref 38–126)
Anion gap: 9 (ref 5–15)
BUN: 8 mg/dL (ref 8–23)
CO2: 29 mmol/L (ref 22–32)
Calcium: 8.1 mg/dL — ABNORMAL LOW (ref 8.9–10.3)
Chloride: 103 mmol/L (ref 98–111)
Creatinine, Ser: 0.53 mg/dL (ref 0.44–1.00)
GFR, Estimated: >60 mL/min (ref >60)
Glucose, Bld: 76 mg/dL (ref 70–99)
Potassium: 4.2 mmol/L (ref 3.5–5.1)
Sodium: 141 mmol/L (ref 135–145)
Total Bilirubin: 1.3 mg/dL — ABNORMAL HIGH (ref 0.0–1.2)
Total Protein: 6 g/dL — ABNORMAL LOW (ref 6.5–8.1)

## 2024-07-09 LAB — PHOSPHORUS: Phosphorus: 3 mg/dL (ref 2.5–4.6)

## 2024-07-09 LAB — MAGNESIUM: Magnesium: 1.9 mg/dL (ref 1.7–2.4)

## 2024-07-09 MED ORDER — SODIUM CHLORIDE 0.9% FLUSH: 10.0000 mL | INTRAVENOUS | Status: DC | PRN

## 2024-07-09 MED ORDER — THIAMINE HCL 100 MG/ML IJ SOLN
100.0000 mg | Freq: Every day | INTRAMUSCULAR | Status: DC
  Administered 2024-07-09 – 2024-07-10 (×2): 100 mg via INTRAVENOUS
  Filled 2024-07-09 (×3): qty 2

## 2024-07-09 MED ORDER — INSULIN ASPART 100 UNIT/ML IJ SOLN
0.0000 [IU] | Freq: Four times a day (QID) | INTRAMUSCULAR | Status: DC
  Administered 2024-07-10 (×3): 2 [IU] via SUBCUTANEOUS
  Administered 2024-07-10 – 2024-07-11 (×3): 1 [IU] via SUBCUTANEOUS

## 2024-07-09 MED ORDER — IPRATROPIUM-ALBUTEROL 0.5-2.5 (3) MG/3ML IN SOLN
3.0000 mL | Freq: Two times a day (BID) | RESPIRATORY_TRACT | Status: DC
  Administered 2024-07-09 – 2024-07-20 (×22): 3 mL via RESPIRATORY_TRACT
  Filled 2024-07-09 (×22): qty 3

## 2024-07-09 MED ORDER — SODIUM CHLORIDE 0.9% FLUSH
10.0000 mL | Freq: Two times a day (BID) | INTRAVENOUS | Status: DC
  Administered 2024-07-09 – 2024-07-12 (×7): 10 mL
  Administered 2024-07-13: 20 mL
  Administered 2024-07-13 – 2024-07-14 (×2): 10 mL
  Administered 2024-07-14: 40 mL
  Administered 2024-07-15 – 2024-07-17 (×5): 10 mL
  Administered 2024-07-18: 20 mL
  Administered 2024-07-19 – 2024-07-20 (×2): 10 mL

## 2024-07-09 MED ORDER — TRAVASOL 10 % IV SOLN
INTRAVENOUS | Status: AC
  Filled 2024-07-09: qty 518.4

## 2024-07-09 MED ORDER — DEXTROSE IN LACTATED RINGERS 5 % IV SOLN: INTRAVENOUS | Status: AC

## 2024-07-09 MED ORDER — CHLORHEXIDINE GLUCONATE CLOTH 2 % EX PADS
6.0000 | MEDICATED_PAD | Freq: Every day | CUTANEOUS | Status: DC
  Administered 2024-07-09 – 2024-07-14 (×6): 6 via TOPICAL

## 2024-07-09 NOTE — Progress Notes (Signed)
 Peripherally Inserted Central Catheter Placement  The IV Nurse has discussed with the patient and/or persons authorized to consent for the patient, the purpose of this procedure and the potential benefits and risks involved with this procedure.  The benefits include less needle sticks, lab draws from the catheter, and the patient may be discharged home with the catheter. Risks include, but not limited to, infection, bleeding, blood clot (thrombus formation), and puncture of an artery; nerve damage and irregular heartbeat and possibility to perform a PICC exchange if needed/ordered by physician.  Alternatives to this procedure were also discussed.  Bard Power PICC patient education guide, fact sheet on infection prevention and patient information card has been provided to patient /or left at bedside.    PICC Placement Documentation  PICC Double Lumen 07/09/24 Right Brachial 35 cm 0 cm (Active)  Indication for Insertion or Continuance of Line Administration of hyperosmolar/irritating solutions (i.e. TPN, Vancomycin, etc.) 07/09/24 0837  Exposed Catheter (cm) 0 cm 07/09/24 0837  Site Assessment Clean, Dry, Intact 07/09/24 0837  Lumen #1 Status Flushed;Blood return noted;Saline locked 07/09/24 0837  Lumen #2 Status Flushed;Blood return noted;Saline locked 07/09/24 0837  Dressing Type Transparent 07/09/24 0837  Dressing Status Antimicrobial disc/dressing in place;Clean, Dry, Intact 07/09/24 0837  Line Care Connections checked and tightened 07/09/24 0837  Line Adjustment (NICU/IV Team Only) No 07/09/24 0837  Dressing Intervention New dressing 07/09/24 0837  Dressing Change Due 07/16/24 07/09/24 0837       Marie Johnson 07/09/2024, 8:39 AM

## 2024-07-09 NOTE — Progress Notes (Addendum)
 PHARMACY - TOTAL PARENTERAL NUTRITION CONSULT NOTE   Indication: Prolonged ileus  Patient Measurements: Height: 5' 7.5 (171.5 cm) Weight: 61.2 kg (134 lb 14.7 oz) IBW/kg (Calculated) : 62.75 TPN AdjBW (KG): 61.2 Body mass index is 20.82 kg/m. Usual Weight: 140lbs per pt, 135 lbs per chart review   Assessment:  73 yo W admitted with mechanical fall and right femur fracture 10/13 and then found to have ileus on KUB 10/16. Pt was switched to clear liquid diet 10/17-10/20 but vomited what she tried drink. Patient has been NPO since so has had no intake for 7 days. She had N/V and required NG tube placement 10/20 PM with brown particulate output and improvement in N/V. The last meal patient remembers eating is a ham and beef sandwich on 10/13. She normally eats 2-3 meals a day - oatmeal or grits for breakfast (doesn't like eggs), sandwiches and meals from meals on wheels. She is not a big eater but is very hungry as of 10/23 and has liked vanilla ensures in the past. She has been on D5LR since 10/20 PM, receiving 90g dextrose /24hr, which will help decrease refeeding risk. Pharmacy consulted for TPN.   Glucose / Insulin: BG <100, no hx DM Electrolytes: CoCa 9.5, others wnl  Renal: Scr 0.53, BUN wnl Hepatic: Alk phos/AST/ALT wnl, Tbili 1.3, albumin  2.2, TG pending  Intake / Output; MIVF: UOP incompletely charted, NG , LBM 10/21  GI Imaging: 10/16 KUB: Diffuse bowel gas distension throughout the abdomen, possible ileus  10/17 CT: Gaseous distention of the colon without discrete focal transition point or mass lesion likely representing ileus 10/20 KUB: Persistent gaseous dilatation of the large bowel 10/21 KUB: moderate diffuse bowel distension, consistent with ileus GI Surgeries / Procedures:  none  Central access: PICC pending  TPN start date: 10/23  Nutritional Goals: Goal TPN rate is 65 mL/hr (provides 75 g of protein and 1648 kcals per day)  RD Assessment: Estimated  Needs Total Energy Estimated Needs: 1530-1836 kcals Total Protein Estimated Needs: 61-91 grams Total Fluid Estimated Needs: 1.5-1.8L/d  Current Nutrition:  NPO   Plan:  Start TPN at 45 mL/hr at 1800, provides 52 g protein, 1151 kcals meeting ~70% estimated needs  Electrolytes in TPN: Na 125 mEq/L, K 30 mEq/L, Ca 3 mEq/L, Mg 10 mEq/L, and Phos 15 mmol/L. Cl:Ac 1:1 Add standard MVI and trace elements to TPN Initiate Sensitive q6h SSI and adjust as needed  Thiamine 100mg /d x5 Reduce MIVF to 35 mL/hr at 1800 Monitor TPN labs daily until stable at goal then on Mon/Thurs   Jinnie Door, PharmD, BCPS, [facility] Clinical Pharmacist  Please check AMION for all [facility] Pharmacy phone numbers After 10:00 PM, call Main Pharmacy [phone]

## 2024-07-09 NOTE — Progress Notes (Signed)
 Nutrition Follow-up  DOCUMENTATION CODES:   Non-severe (moderate) malnutrition in context of chronic illness  INTERVENTION:  Initiate TPN via PICC, managed by Pharmacy  Risk of refeeding. Recommend 100 mg thiamine IV for 5 days with start of TPN. Monitor K+, Phos and Mg x 3 days. Replete outside TPN as needed.  Obtain updated bed scale weight   NUTRITION DIAGNOSIS:   Moderate Malnutrition related to chronic illness as evidenced by energy intake < 75% for > or equal to 1 month, mild fat depletion, moderate muscle depletion. - ongoing    GOAL:   Patient will meet greater than or equal to 90% of their needs - not met    MONITOR:   PO intake, Supplement acceptance, Weight trends  REASON FOR ASSESSMENT:   Consult New TPN/TNA  ASSESSMENT:   medical history significant of COPD on chronic 2 L  CAD, PAD, HTN ,HLD, bilateral hip replacements presented with mechanical fall after she tripped on her oxygen  tubing resulting in right hip pain. 10/23 - PICC placed   TPN ordered on 10/21 discontinued/not started. Patient with ongoing ileus on repeat KUB. Reconsulted for TPN to start tonight. PICC line placed this morning. Spoke to pharmacy about adding thiamine to TPN bag due to risk of refeeding, pt has been on IV dextrose  (~90 g/d) since 10/20 so risk not as high and will order IV thiamine x 5 days starting tonight. Pt with NGT in place, plan for clamp trial this afternoon.    Admit weight: 61.2 kg  Current weight: 61.2 kg  Nutritionally Relevant Medications: Dulcolax,  SSI 0-9 units q 6 hrs, lactulose , miralax     Labs Reviewed.    Output: 470  ml NGT  NUTRITION - FOCUSED PHYSICAL EXAM:  Flowsheet Row Most Recent Value  Orbital Region Mild depletion  Upper Arm Region Moderate depletion  Thoracic and Lumbar Region Unable to assess  [pt with back pain]  Buccal Region Mild depletion  Temple Region Moderate depletion  Clavicle Bone Region Moderate depletion  Clavicle and  Acromion Bone Region Moderate depletion  Scapular Bone Region Unable to assess  [pt with back pain]  Dorsal Hand Moderate depletion  Patellar Region Moderate depletion  Anterior Thigh Region Moderate depletion  Posterior Calf Region Mild depletion  Hair Reviewed  Eyes Reviewed  Mouth Reviewed  Skin Reviewed  Nails Reviewed    Diet Order:   Diet Order             Diet NPO time specified Except for: Ice Chips  Diet effective now                   EDUCATION NEEDS:   Education needs have been addressed  Skin:  Skin Assessment: Reviewed RN Assessment  Last BM:  PTA  Height:   Ht Readings from Last 1 Encounters:  07/03/24 5' 7.5 (1.715 m)    Weight:   Wt Readings from Last 1 Encounters:  07/03/24 61.2 kg    BMI:  Body mass index is 20.82 kg/m.  Estimated Nutritional Needs:   Kcal:  1530-1836 kcals  Protein:  61-91 grams  Fluid:  1.5-1.8L/d  Madalyn Potters, MS, RD, LDN Clinical Dietitian  Contact via secure chat. If unavailable, use group chat RD Inpatient.

## 2024-07-09 NOTE — Progress Notes (Signed)
 Marie Johnson  FMW:[id number] DOB: 1951/06/08 DOA: 06/29/2024 PCP: Stephanie Charlene CROME, MD    Brief Narrative:  73 year old with a history of COPD on 2 L nasal cannula home oxygen , CAD, PAD, HTN, HLD, and prior bilateral hip replacements who presented to the ER 10/13 after a mechanical fall with subsequent hip pain ultimately found to be due to an acute periprosthetic right femur fracture.  She has been evaluated by orthopedic surgery who recommended nonoperative management.  Her hospital stay has been complicated by colonic ileus.  Goals of Care:   Code Status: Full Code   DVT prophylaxis: enoxaparin  (LOVENOX ) injection 40 mg Start: 07/06/24 1000 SCDs Start: 06/29/24 2211   Interim Hx: No acute events reported overnight.  Afebrile.  Vital signs stable.  Oxygen  saturation 96-99% on 5 L nasal cannula support.  Tolerated clamping of her NG well today.  Is excited about having a trial of clears later.  Tells me she is feeling much better overall.  Assessment & Plan:  Acute periprosthetic right femur fracture status post mechanical fall Orthopedics has recommended nonoperative management with WBAT with RW - patient to follow-up with her Orthopedist in [place] after discharge  Colonic ileus Confirmed on KUB 10/16 -CT without evidence of a clear transition point or mass -GI has evaluated -continue aggressive bowel regimen -NG remains in place but with clamping trials today - TNA ordered 10/22 and to be initiated 10/23  Chronic hypoxic respiratory failure - COPD On 2 L nasal cannula oxygen  support at home -requiring more oxygen  support than her baseline due to abdominal distention/decreased abdominal compliance -encouraged use of incentive spirometer and getting up to chair frequently  Possible aspiration pneumonitis Follow-up CXR 10/21 noted patchy densities in the right lung with a small to moderate right size effusion and a small left effusion - in setting of ileus it is suspected that she  has suffered an aspiration event  -possible right lung base organizing pneumonia noted on CT scan - follow-up as outpatient after completing course of antibiotic  Anemia of chronic disease - B12 deficiency Baseline hemoglobin approximately 10-11 -no evidence of significant bleeding -B12 modestly low at 371 -hemoglobin stable  Hypokalemia Corrected with supplementation  Hypomagnesemia Corrected with supplementation  History of CAD, PAD, and HLD Status post [facility] July 2024 with mild to moderate nonobstructive disease -was previously on DAPT for PAD -continue aspirin  and Lipitor  -Plavix  reportedly stopped 2 weeks ago   Chronic diastolic CHF Euvolemic at present -TTE noted EF 60-65% with no WMA  HTN Blood pressure controlled at present  Gout Continue usual allopurinol   Nutrition Problem: Moderate Malnutrition Etiology: chronic illness Signs/Symptoms: energy intake < 75% for > or equal to 1 month, mild fat depletion, moderate muscle depletion Interventions: Ensure Enlive (each supplement provides 350kcal and 20 grams of protein), MVI   Family Communication: No family present at time of exam today Disposition:   Skilled Nursing-Short Term Rehab (<3 Hours/Day)07/07/2024 1436  Objective: Blood pressure (!) 153/58, pulse (!) 110, temperature 98.1 F (36.7 C), temperature source Oral, resp. rate 18, height 5' 7.5 (1.715 m), weight 61.2 kg, SpO2 96%.  Intake/Output Summary (Last 24 hours) at 07/09/2024 0808 Last data filed at 07/09/2024 0558 Gross per 24 hour  Intake 226.31 ml  Output 1020 ml  Net -793.69 ml   Filed Weights   06/30/24 1100 07/03/24 2004  Weight: 61.2 kg 61.2 kg    Examination: General: No acute respiratory distress Lungs: Clear to auscultation bilaterally without wheezes or crackles Cardiovascular:  Regular rate and rhythm without murmur gallop or rub normal S1 and S2 Abdomen: Less tender today relative to yesterday, soft, BS positive, no rebound, no  mass Extremities: No significant cyanosis, clubbing, or edema bilateral lower extremities  CBC: Recent Labs  Lab 07/06/24 0613 07/06/24 1300 07/07/24 0445 07/08/24 0531 07/09/24 0533  WBC 10.3  --  8.5 8.9 8.1  NEUTROABS 8.7*  --  7.1 7.1  --   HGB 8.4*   < > 8.5* 8.8* 8.6*  HCT 26.0*   < > 26.8* 28.4* 28.0*  MCV 98.5  --  99.3 101.4* 100.4*  PLT 355  --  384 411* 385   < > = values in this interval not displayed.   Basic Metabolic Panel: Recent Labs  Lab 07/07/24 0445 07/07/24 1534 07/08/24 0531 07/09/24 0533  NA 131*  --  135 141  K 5.3* 4.7 4.5 4.2  CL 96*  --  102 103  CO2 29  --  25 29  GLUCOSE 108*  --  95 76  BUN 31*  --  15 8  CREATININE 0.76  --  0.56 0.53  CALCIUM  8.3*  --  8.2* 8.1*  MG 2.5*  --  2.2 1.9  PHOS 2.2*  --  2.6 3.0   GFR: Estimated Creatinine Clearance: 58.7 mL/min (by C-G formula based on SCr of 0.53 mg/dL).   Scheduled Meds:  acetaminophen   1,000 mg Oral Q8H   allopurinol   300 mg Oral QPM   arformoterol  15 mcg Nebulization BID   aspirin  EC  81 mg Oral q AM   bisacodyl   10 mg Rectal BID   budesonide  (PULMICORT ) nebulizer solution  0.25 mg Nebulization BID   enoxaparin  (LOVENOX ) injection  40 mg Subcutaneous Daily   [START ON 07/10/2024] insulin aspart  0-9 Units Subcutaneous Q6H   ipratropium-albuterol   3 mL Nebulization TID   lactulose   30 g Oral TID   lidocaine   2 patch Transdermal Q24H   pantoprazole  (PROTONIX ) IV  40 mg Intravenous Q12H   polyethylene glycol  17 g Oral BID   sodium phosphate   1 enema Rectal Once   umeclidinium bromide   1 puff Inhalation Daily   Continuous Infusions:  ampicillin-sulbactam (UNASYN) IV 3 g (07/09/24 0508)   dextrose  5% lactated ringers  75 mL/hr at 07/08/24 1505   dextrose  5% lactated ringers      TPN ADULT (ION)       LOS: 10 days   Reyes IVAR Moores, MD [facility] Office  [phone] Pager - Text Page per Tracey  If 7PM-7AM, please contact night-coverage per Amion 07/09/2024,  8:08 AM

## 2024-07-09 NOTE — Progress Notes (Addendum)
 Per MD at bedside, clamped NGT x1 hour at 0945 and gave meds down NGT.   Will monitor pt for pain and nausea.   Pt education given and pt verb understanding.

## 2024-07-09 NOTE — Progress Notes (Addendum)
 [facility] Progress Note  SUBJECTIVE:   Interval history: Marie Johnson was seen and evaluated today at bedside. Resting in bed. Had PICC this AM for TPN tonight. Had BM yesterday, none overnight. Noted that her abdomen feels less tense.   Past Medical History:  Diagnosis Date   AAA (abdominal aortic aneurysm)    3.4 cm   Anemia    Anxiety    Aortic atherosclerosis    Arthritis    Asthma    CAD (coronary artery disease)    a. Nonobstructive CAD by cath 07/17/13.   Cancer [facility])    Carotid artery disease    Carotid US  8/22: Bilateral ICA 1-39; right subclavian stenosis; right thyroid  nodule   CHD (congenital heart disease)    pt unaware???   Chest pain    a. Adm 10-07/2013 - CTA neg for PE/dissection, cath with nonobstructive disease, CP ?due to uncontrolled HTN vs coronary vasospasm.   CHF (congestive heart failure) (HCC)    Closed head injury with concussion    COPD (chronic obstructive pulmonary disease) (HCC)    Coronary artery disease    Echocardiogram    Echocardiogram 06/2019: EF 60-65, impaired relaxation (Gr 1 DD), mild MAC, mild MR, mild to mod aortic valve sclerosis (no AS), trivial TR, mild LAE, normal RVSF, RVSP 30.7 (mildly elevated)   GERD (gastroesophageal reflux disease)    History of blood transfusion    childbirth, Hysterectomy   Hyperlipidemia    Hypertension    Neuropathy    OA (osteoarthritis)    Osteoporosis    PAD (peripheral artery disease)    a. s/p L fem-pop bypass b. recent evaluation at [facility], unamenable to intervention   PAD (peripheral artery disease)    Palpitations 02/22/2016   Pneumonia    Pre-diabetes    Premature ventricular contraction    PUD (peptic ulcer disease)    PVD (peripheral vascular disease) 07/17/2013   Thoracic aortic aneurysm    Chest/aorta CTA 04/2022: 14 x 8 focal saccular aneurysm versus penetrating atherosclerotic ulcer involving left side of proximal portion of transverse aortic arch; descending thoracic  aorta and proximal abdominal aorta aneurysm 4.4 cm; aortic atherosclerosis; emphysema   Tobacco abuse    Wears dentures    Wears glasses    Past Surgical History:  Procedure Laterality Date   ABDOMINAL AORTOGRAM W/LOWER EXTREMITY N/A 03/09/2020   Procedure: ABDOMINAL AORTOGRAM W/LOWER EXTREMITY;  Surgeon: Gretta Lonni PARAS, MD;  Location: [facility] INVASIVE CV LAB;  Service: Cardiovascular;  Laterality: N/A;   ABDOMINAL AORTOGRAM W/LOWER EXTREMITY N/A 07/26/2022   Procedure: ABDOMINAL AORTOGRAM W/LOWER EXTREMITY;  Surgeon: Gretta Lonni PARAS, MD;  Location: [facility] INVASIVE CV LAB;  Service: Cardiovascular;  Laterality: N/A;   ABDOMINAL AORTOGRAM W/LOWER EXTREMITY N/A 09/20/2022   Procedure: ABDOMINAL AORTOGRAM W/LOWER EXTREMITY;  Surgeon: Gretta Lonni PARAS, MD;  Location: [facility] INVASIVE CV LAB;  Service: Cardiovascular;  Laterality: N/A;   ABDOMINAL HYSTERECTOMY     BREAST SURGERY     left cyst excision x 2   CARDIAC CATHETERIZATION     several years ago, nonobstructive, 50-60% blockages   DIRECT LARYNGOSCOPY Bilateral 03/13/2023   Procedure: MICRO DIRECT LARYNGOSCOPY WITH BIOPSY;  Surgeon: Carlie Clark, MD;  Location: [facility] OR;  Service: ENT;  Laterality: Bilateral;   ENDARTERECTOMY FEMORAL Right 03/18/2020   Procedure: ENDARTERECTOMY FEMORAL WITH PROFUNDAPLASTY;  Surgeon: Gretta Lonni PARAS, MD;  Location: [facility] OR;  Service: Vascular;  Laterality: Right;   FEMORAL-POPLITEAL BYPASS GRAFT Right 03/18/2020   Procedure: BYPASS GRAFT FEMORAL-POPLITEAL  ARTERY using GORE PROPATEN VASCULAR GRAFT;  Surgeon: Gretta Lonni PARAS, MD;  Location: [facility] OR;  Service: Vascular;  Laterality: Right;   HIP SURGERY     left - bars and screws placed   HIP SURGERY     INSERTION OF ILIAC STENT Right 03/18/2020   Procedure: INSERTION OF GORE VIABAHN  ILIAC STENT;  Surgeon: Gretta Lonni PARAS, MD;  Location: [facility] OR;  Service: Vascular;  Laterality: Right;   LEFT HEART CATH AND CORONARY ANGIOGRAPHY N/A 04/08/2023   Procedure:  LEFT HEART CATH AND CORONARY ANGIOGRAPHY;  Surgeon: Anner Alm ORN, MD;  Location: [facility] INVASIVE CV LAB;  Service: Cardiovascular;  Laterality: N/A;   LEFT HEART CATHETERIZATION WITH CORONARY ANGIOGRAM N/A 07/17/2013   Procedure: LEFT HEART CATHETERIZATION WITH CORONARY ANGIOGRAM;  Surgeon: Ezra GORMAN Shuck, MD;  Location: [facility] CATH LAB;  Service: Cardiovascular;  Laterality: N/A;   LOWER EXTREMITY ANGIOGRAM Right 03/18/2020   Procedure: RIGHT ILIAC ARTERIOGRAM WITH AN ILIAC STENT;  Surgeon: Gretta Lonni PARAS, MD;  Location: [facility] OR;  Service: Vascular;  Laterality: Right;   MULTIPLE TOOTH EXTRACTIONS     PATCH ANGIOPLASTY Right 03/18/2020   Procedure: PATCH ANGIOPLASTY using a GEORGE BIOLOGIC PATCH OF THE PROFUNDA;  Surgeon: Gretta Lonni PARAS, MD;  Location: [facility] OR;  Service: Vascular;  Laterality: Right;   PERIPHERAL VASCULAR CATHETERIZATION N/A 07/16/2016   Procedure: Abdominal Aortogram w/Lower Extremity;  Surgeon: Krystal JULIANNA Doing, MD;  Location: [facility] INVASIVE CV LAB;  Service: Cardiovascular;  Laterality: N/A;   PERIPHERAL VASCULAR INTERVENTION Left 03/09/2020   Procedure: PERIPHERAL VASCULAR INTERVENTION;  Surgeon: Gretta Lonni PARAS, MD;  Location: [facility] INVASIVE CV LAB;  Service: Cardiovascular;  Laterality: Left;  common and external illiac    ROTATOR CUFF REPAIR     right side, had torn ligaments as well   TUBAL LIGATION     VEIN BYPASS SURGERY     left leg   WRIST ARTHROCENTESIS     left - broke in 3 places so has bars and screws placed   Current Facility-Administered Medications  Medication Dose Route Frequency Provider Last Rate Last Admin   acetaminophen  (TYLENOL ) tablet 1,000 mg  1,000 mg Oral Q8H Perri DELENA Meliton Mickey., MD       Followed by   NOREEN ON 07/14/2024] acetaminophen  (TYLENOL ) tablet 650 mg  650 mg Oral Q6H PRN Perri DELENA Meliton Mickey., MD       albuterol  (PROVENTIL ) (2.5 MG/3ML) 0.083% nebulizer solution 2.5 mg  2.5 mg Nebulization Q2H PRN Ezenduka, Nkeiruka J, MD   2.5 mg  at 07/08/24 1840   allopurinol  (ZYLOPRIM ) tablet 300 mg  300 mg Oral QPM Ezenduka, Nkeiruka J, MD   300 mg at 07/05/24 1759   Ampicillin-Sulbactam (UNASYN) 3 g in sodium chloride  0.9 % 100 mL IVPB  3 g Intravenous Q6H Billy Rocky SAUNDERS, RPH 200 mL/hr at 07/09/24 0508 3 g at 07/09/24 0508   arformoterol (BROVANA) nebulizer solution 15 mcg  15 mcg Nebulization BID Perri DELENA Meliton Mickey., MD   15 mcg at 07/09/24 0855   aspirin  EC tablet 81 mg  81 mg Oral q AM Perri DELENA Meliton Mickey., MD   81 mg at 07/09/24 0946   bisacodyl  (DULCOLAX) suppository 10 mg  10 mg Rectal BID Karki, Arya, MD   10 mg at 07/09/24 9051   budesonide  (PULMICORT ) nebulizer solution 0.25 mg  0.25 mg Nebulization BID Perri DELENA Meliton Mickey., MD   0.25 mg at 07/09/24 0855   Chlorhexidine  Gluconate Cloth 2 %  PADS 6 each  6 each Topical Daily Danton Reyes DASEN, MD   6 each at 07/09/24 0948   dextrose  5 % in lactated ringers  infusion   Intravenous Continuous Chen, Lydia D, RPH 75 mL/hr at 07/08/24 1505 Infusion Verify at 07/08/24 1505   dextrose  5 % in lactated ringers  infusion   Intravenous Continuous Chen, Lydia D, RPH       enoxaparin  (LOVENOX ) injection 40 mg  40 mg Subcutaneous Daily Perri DELENA Meliton Mickey., MD   40 mg at 07/09/24 9053   HYDROmorphone  (DILAUDID ) injection 0.5-1 mg  0.5-1 mg Intravenous Q2H PRN Perri DELENA Meliton Mickey., MD   1 mg at 07/09/24 1056   [START ON 07/10/2024] insulin aspart (novoLOG) injection 0-9 Units  0-9 Units Subcutaneous Q6H Chen, Lydia D, [facility]       ipratropium-albuterol  (DUONEB) 0.5-2.5 (3) MG/3ML nebulizer solution 3 mL  3 mL Nebulization BID Danton Reyes DASEN, MD       lactulose  (CHRONULAC ) 10 GM/15ML solution 30 g  30 g Oral TID Saintclair Jasper, MD   30 g at 07/09/24 9052   lidocaine  (LIDODERM ) 5 % 2 patch  2 patch Transdermal Q24H Perri DELENA Meliton Mickey., MD   2 patch at 07/06/24 1026   melatonin tablet 3 mg  3 mg Oral QHS PRN Doutova, Anastassia, MD   3 mg at 07/03/24 2023   methocarbamol (ROBAXIN)  tablet 500 mg  500 mg Oral Q6H PRN Doutova, Anastassia, MD   500 mg at 07/05/24 9373   Or   methocarbamol (ROBAXIN) injection 500 mg  500 mg Intravenous Q6H PRN Doutova, Anastassia, MD   500 mg at 07/09/24 0218   ondansetron  (ZOFRAN ) injection 4 mg  4 mg Intravenous Q6H PRN Daniels, James K, NP   4 mg at 07/05/24 2143   oxyCODONE  (Oxy IR/ROXICODONE ) immediate release tablet 10 mg  10 mg Oral Q4H PRN Perri DELENA Meliton Mickey., MD   10 mg at 07/05/24 0000   Or   oxyCODONE  (Oxy IR/ROXICODONE ) immediate release tablet 15 mg  15 mg Oral Q4H PRN Perri DELENA Meliton Mickey., MD   15 mg at 07/07/24 1323   pantoprazole  (PROTONIX ) injection 40 mg  40 mg Intravenous Q12H Perri DELENA Meliton Mickey., MD   40 mg at 07/09/24 0946   phenol (CHLORASEPTIC) mouth spray 1 spray  1 spray Mouth/Throat PRN Perri DELENA Meliton Mickey., MD   1 spray at 07/07/24 2002   polyethylene glycol (MIRALAX  / GLYCOLAX ) packet 17 g  17 g Oral BID Perri DELENA Meliton Mickey., MD   17 g at 07/09/24 0946   simethicone (MYLICON) chewable tablet 80 mg  80 mg Oral Q6H PRN Chavez, Abigail, NP   80 mg at 07/06/24 2117   sodium chloride  flush (NS) 0.9 % injection 10-40 mL  10-40 mL Intracatheter Q12H Danton Reyes DASEN, MD   10 mL at 07/09/24 9051   sodium chloride  flush (NS) 0.9 % injection 10-40 mL  10-40 mL Intracatheter PRN Danton Reyes DASEN, MD       sodium phosphate  (FLEET) enema 1 enema  1 enema Rectal Once Karki, Arya, MD       thiamine (VITAMIN B1) injection 100 mg  100 mg Intravenous Daily Chen, Lydia D, RPH       TPN ADULT (ION)   Intravenous Continuous TPN Chen, Lydia D, RPH       umeclidinium bromide  (INCRUSE ELLIPTA ) 62.5 MCG/ACT 1 puff  1 puff Inhalation Daily Reome, Earle J, RPH   1  puff at 07/09/24 0853   Facility-Administered Medications Ordered in Other Encounters  Medication Dose Route Frequency Provider Last Rate Last Admin   DOBUTamine  (DOBUTREX ) 1,000 mcg/mL in dextrose  5% 250 mL infusion  30 mcg/kg/min Intravenous Titrated Maranda Leim DEL, MD       Allergies as of 06/29/2024 - Review Complete 06/29/2024  Allergen Reaction Noted   Bee venom Anaphylaxis and Other (See Comments) 07/17/2013   Insect extract Anaphylaxis and Other (See Comments) 03/08/2020   Farxiga  [dapagliflozin ] Nausea And Vomiting 06/11/2022   Lyrica [pregabalin] Nausea And Vomiting 06/11/2022   Neurontin [gabapentin] Nausea And Vomiting and Other (See Comments) 11/09/2021   Nsaids Nausea Only and Other (See Comments) 03/08/2020   Latex Rash and Other (See Comments) 07/12/2016   Review of Systems:  Review of Systems  Gastrointestinal:  Negative for abdominal pain, nausea and vomiting.    OBJECTIVE:   Temp:  [97.5 F (36.4 C)-98.4 F (36.9 C)] 97.5 F (36.4 C) (10/23 0910) Pulse Rate:  [99-110] 99 (10/23 0910) Resp:  [17-18] 17 (10/23 0910) BP: (148-155)/(58-81) 148/81 (10/23 0910) SpO2:  [93 %-100 %] 100 % (10/23 0910) Last BM Date : 07/07/24 Physical Exam Constitutional:      General: She is not in acute distress.    Appearance: She is ill-appearing. She is not toxic-appearing or diaphoretic.  Cardiovascular:     Rate and Rhythm: Rhythm irregular.  Pulmonary:     Effort: No respiratory distress.     Breath sounds: Normal breath sounds.     Comments: Nasal cannula supplemental oxygen  4 liters Abdominal:     General: Bowel sounds are normal. There is distension.     Palpations: Abdomen is soft.     Tenderness: There is no abdominal tenderness.     Comments: NG tube in place ~250 cc in collection container  Neurological:     Mental Status: She is alert.     Labs: Recent Labs    07/07/24 0445 07/08/24 0531 07/09/24 0533  WBC 8.5 8.9 8.1  HGB 8.5* 8.8* 8.6*  HCT 26.8* 28.4* 28.0*  PLT 384 411* 385   BMET Recent Labs    07/07/24 0445 07/07/24 1534 07/08/24 0531 07/09/24 0533  NA 131*  --  135 141  K 5.3* 4.7 4.5 4.2  CL 96*  --  102 103  CO2 29  --  25 29  GLUCOSE 108*  --  95 76  BUN 31*  --  15 8  CREATININE  0.76  --  0.56 0.53  CALCIUM  8.3*  --  8.2* 8.1*   LFT Recent Labs    07/09/24 0533  PROT 6.0*  ALBUMIN  2.2*  AST 20  ALT 12  ALKPHOS 52  BILITOT 1.3*   PT/INR No results for input(s): LABPROT, INR in the last 72 hours. Diagnostic imaging: US  EKG SITE RITE Result Date: 07/08/2024 If Site Rite image not attached, placement could not be confirmed due to current cardiac rhythm.  DG CHEST PORT 1 VIEW Result Date: 07/08/2024 EXAM: 1 VIEW(S) XRAY OF THE CHEST 07/08/2024 05:53:09 AM COMPARISON: 07/07/2024 CLINICAL HISTORY: Sob,pneumonia.hx COPD,CHF,ASTHMA; rover FINDINGS: LINES, TUBES AND DEVICES: Enteric tube is identified with tip coursing below the level of the GE junction. The side port appears to remain at the level of the GE junction consider advancing for optimal positioning. LUNGS AND PLEURA: Unchanged right lower lung airspace opacity. Small right pleural effusion. Trace left pleural effusion, unchanged. Similar appearance of mild increased peripheral septal markings compatible with edema.  No pneumothorax. HEART AND MEDIASTINUM: Unchanged cardiomegaly. Atherosclerotic calcifications. BONES AND SOFT TISSUES: No acute osseous abnormality. IMPRESSION: 1. Enteric tube side port at the gastroesophageal junction; recommend advancement for optimal positioning. 2. Unchanged right lower lung airspace opacity and small right pleural effusion. 3. Trace left pleural effusion, unchanged. 4. Mild interstitial edema pattern. Electronically signed by: Waddell Calk MD 07/08/2024 07:24 AM EDT RP Workstation: [id number]   DG CHEST PORT 1 VIEW Result Date: 07/07/2024 CLINICAL DATA:  Rhonchi. EXAM: PORTABLE CHEST 1 VIEW COMPARISON:  07/05/2024 FINDINGS: Stable enlarged cardiac silhouette. Tortuous and partially calcified thoracic aorta. Interval nasogastric tube with its tip not included. The side hole is partially included and is in the region of the distal esophagus. It is recommended that this be  advanced 5 cm. Small to moderate sized right pleural effusion and small left pleural effusion. Mild increase in small patchy densities in the right lung. Stable chronic interstitial prominence and mild peribronchial thickening. Diffuse osteopenia. Moderate bilateral glenohumeral degenerative changes. IMPRESSION: 1. Interval nasogastric tube with its tip not included. The side hole is partially included and is in the region of the distal esophagus. It is recommended that this be advanced 5 cm. 2. Mild increase in small patchy densities in the right lung, compatible with atypical pneumonia. 3. Stable cardiomegaly and chronic interstitial lung disease. 4. Small to moderate sized right pleural effusion and small left pleural effusion. Electronically Signed   By: Elspeth Bathe M.D.   On: 07/07/2024 16:27   DG Abd 1 View Result Date: 07/07/2024 CLINICAL DATA:  Ileus. EXAM: ABDOMEN - 1 VIEW COMPARISON:  Radiographs 07/06/2024 and 07/02/2024.  CT 07/03/2024. FINDINGS: 1133 hours. Two supine views of the abdomen are submitted. Enteric tube projects over the proximal stomach with the side hole near the gastroesophageal junction. No significant change in moderate diffuse bowel distension, most consistent with an ileus. No supine evidence of pneumoperitoneum. Extensive vascular calcifications noted status post iliac stenting. Previous bilateral total hip arthroplasty with grossly stable asymmetric configuration of the visualized left femoral component. IMPRESSION: No significant change in moderate diffuse bowel distension, most consistent with an ileus. Enteric tube projects over the proximal stomach. Electronically Signed   By: Elsie Perone M.D.   On: 07/07/2024 16:20   IMPRESSION: Colonic ileus, NG tube Protein calorie malnutrition Right hip fracture, narcotics for analgesia History of chronic idiopathic constipation Chronic obstructive pulmonary disease  PLAN: -Abdomen less distended today, hopeful for  improvement in ileus, ok to clamp NG tube this AM, trial PO medications and some clear liquids this afternoon, maintain NG tube for now -Rectal suppository today, monitor bowel movements -Discussed above with nursing -Holding Linzess for now -[facility] will follow   LOS: 10 days   Estefana Keas, DO [facility]  UPDATE: 4:17 PM Communicated with bedside nursing that patient has tolerated NG tube clamp since this AM, tolerated small amounts of liquids and medications. Had large bowel movement this afternoon, brown, liquid. Ok to continue with NG tube clamp. Trial clear liquid diet overnight. Monitor bowel movements. Check abdominal xray in AM.   Estefana Keas, DO [facility]

## 2024-07-09 NOTE — Plan of Care (Signed)

## 2024-07-10 ENCOUNTER — Inpatient Hospital Stay (HOSPITAL_COMMUNITY)

## 2024-07-10 DIAGNOSIS — K567 Ileus, unspecified: Secondary | ICD-10-CM | POA: Diagnosis not present

## 2024-07-10 DIAGNOSIS — S72001G Fracture of unspecified part of neck of right femur, subsequent encounter for closed fracture with delayed healing: Secondary | ICD-10-CM | POA: Diagnosis not present

## 2024-07-10 LAB — RENAL FUNCTION PANEL
Albumin: 2.1 g/dL — ABNORMAL LOW (ref 3.5–5.0)
Anion gap: 7 (ref 5–15)
BUN: 14 mg/dL (ref 8–23)
CO2: 29 mmol/L (ref 22–32)
Calcium: 8.1 mg/dL — ABNORMAL LOW (ref 8.9–10.3)
Chloride: 103 mmol/L (ref 98–111)
Creatinine, Ser: 0.65 mg/dL (ref 0.44–1.00)
GFR, Estimated: >60 mL/min (ref >60)
Glucose, Bld: 151 mg/dL — ABNORMAL HIGH (ref 70–99)
Phosphorus: 3.4 mg/dL (ref 2.5–4.6)
Potassium: 3.6 mmol/L (ref 3.5–5.1)
Sodium: 139 mmol/L (ref 135–145)

## 2024-07-10 LAB — TRIGLYCERIDES: Triglycerides: 68 mg/dL (ref <150)

## 2024-07-10 LAB — GLUCOSE, CAPILLARY
Glucose-Capillary: 142 mg/dL — ABNORMAL HIGH (ref 70–99)
Glucose-Capillary: 143 mg/dL — ABNORMAL HIGH (ref 70–99)
Glucose-Capillary: 153 mg/dL — ABNORMAL HIGH (ref 70–99)
Glucose-Capillary: 154 mg/dL — ABNORMAL HIGH (ref 70–99)
Glucose-Capillary: 154 mg/dL — ABNORMAL HIGH (ref 70–99)

## 2024-07-10 LAB — MAGNESIUM: Magnesium: 2 mg/dL (ref 1.7–2.4)

## 2024-07-10 MED ORDER — TRAVASOL 10 % IV SOLN
INTRAVENOUS | Status: DC
  Filled 2024-07-10: qty 811.2

## 2024-07-10 MED ORDER — ENSURE PLUS HIGH PROTEIN PO LIQD
237.0000 mL | Freq: Two times a day (BID) | ORAL | Status: DC
  Administered 2024-07-11 – 2024-07-12 (×2): 237 mL via ORAL

## 2024-07-10 NOTE — TOC Progression Note (Signed)
 Transition of Care [facility]) - Progression Note    Patient Details  Name: Marie Johnson MRN: [id number] Date of Birth: 27-Jun-1951  Transition of [facility]) CM/SW Contact  Bridget Cordella Simmonds, LCSW Phone Number: 07/10/2024, 1:22 PM  Clinical Narrative:   ICM continues to follow for DC needs. CSW spoke with [facility], who is aware pt not stable for DC.    Expected Discharge Plan: Skilled Nursing Facility Barriers to Discharge: Continued Medical Work up, SNF Pending bed offer               Expected Discharge Plan and Services In-house Referral: Clinical Social Work   Post Acute Care Choice: Skilled Nursing Facility Living arrangements for the past 2 months: Single Family Home                                       Social Drivers of Health (SDOH) Interventions SDOH Screenings   Food Insecurity: No Food Insecurity (06/29/2024)  Housing: High Risk (06/29/2024)  Transportation Needs: No Transportation Needs (06/29/2024)  Utilities: Not At Risk (06/29/2024)  Depression (PHQ2-9): Low Risk  (05/02/2023)  Social Connections: Moderately Isolated (06/29/2024)  Tobacco Use: Medium Risk (06/29/2024)    Readmission Risk Interventions    06/06/2023   12:35 PM  Readmission Risk Prevention Plan  Transportation Screening Complete  PCP or Specialist Appt within 5-7 Days Complete  Home Care Screening Complete  Medication Review (RN CM) Complete

## 2024-07-10 NOTE — Progress Notes (Signed)
 Occupational Therapy Treatment Patient Details Name: Marie Johnson MRN: [id number] DOB: [birth year] Today's Date: 07/10/2024   History of present illness Pt is a 73 y/o female who presents 06/29/2024 s/p fall at home. She sustained a R periprosthetic femoral shaft fracture. Ortho consulted and plan is for non-op management; WBAT RLE. PMH significant for AAA, asthma, CAD, CA, CHF, COPD on 2L/min O2 at baseline, HTN, neuropathy, OA, osteoporosis, PVC's, PVD, fem-pop 2021, R rotator cuff repair.   OT comments  Pt is slowly progressing towards OT goals. Pt required Max verbal encouragement this session to engage in functional mobility, as well as increased time to address Pt anxiety and self-limiting behaviors. Pt agreeable to OOB mobility eventually, requiring use of Stedy and +2 assist for all mobility. Pt required Max A +2 bed mobility and light Mod +2 sit to stand. Pt continues to require Total A LB ADL engagement and up to Mod A additional ADL tasks. Acute OT recommendations remain appropriate and unchanged. Will continue to follow and progress as able per POC.       If plan is discharge home, recommend the following:  Two people to help with walking and/or transfers;Two people to help with bathing/dressing/bathroom;Assistance with cooking/housework;Assist for transportation;Help with stairs or ramp for entrance   Equipment Recommendations  BSC/3in1;Wheelchair (measurements OT);Wheelchair cushion (measurements OT)    Recommendations for Other Services      Precautions / Restrictions Precautions Precautions: Fall Recall of Precautions/Restrictions: Impaired Precaution/Restrictions Comments: NG tube Restrictions Weight Bearing Restrictions Per Provider Order: Yes RLE Weight Bearing Per Provider Order: Weight bearing as tolerated       Mobility Bed Mobility Overal bed mobility: Needs Assistance Bed Mobility: Supine to Sit     Supine to sit: Max assist, +2 for physical assistance,  HOB elevated, Used rails     General bed mobility comments: HOB significantly elevated. +2 assist to helicopter pt around to EOB with bed pad. Min use of UE's on rails for assist.    Transfers Overall transfer level: Needs assistance Equipment used: Ambulation equipment used Transfers: Sit to/from Stand, Bed to chair/wheelchair/BSC Sit to Stand: Mod assist, +2 physical assistance, +2 safety/equipment, From elevated surface           General transfer comment: Pt stood with +2 mod assist to Cla[place]. Increased time due to pt anxiety around breathing. Transfer via Lift Equipment: Stedy   Balance Overall balance assessment: Needs assistance Sitting-balance support: Feet supported, No upper extremity supported Sitting balance-Leahy Scale: Fair     Standing balance support: Bilateral upper extremity supported, Reliant on assistive device for balance Standing balance-Leahy Scale: Poor                             ADL either performed or assessed with clinical judgement   ADL Overall ADL's : Needs assistance/impaired Eating/Feeding: Set up;Sitting   Grooming: Wash/dry face;Set up;Sitting   Upper Body Bathing: Minimal assistance;Sitting               Toilet Transfer: Moderate assistance;+2 for safety/equipment;+2 for physical assistance Toilet Transfer Details (indicate cue type and reason): requires STEDY Toileting- Clothing Manipulation and Hygiene: Total assistance Toileting - Clothing Manipulation Details (indicate cue type and reason): purewick            Extremity/Trunk Assessment Upper Extremity Assessment Upper Extremity Assessment: RUE deficits/detail;Generalized weakness RUE Deficits / Details: Pt appears to have decreased AROM of R shoulder consistent with prior rotator cuff repair.  Not formally assessed by PT.            Vision   Vision Assessment?: No apparent visual deficits   Perception     Praxis     Communication  Communication Communication: No apparent difficulties   Cognition Arousal: Alert Behavior During Therapy: Anxious Cognition: No family/caregiver present to determine baseline             OT - Cognition Comments: follows one step commands, limited by anxiety with movement. self-limiting behaviors                 Following commands: Intact        Cueing   Cueing Techniques: Verbal cues, Gestural cues  Exercises      Shoulder Instructions       General Comments Pt with increased anxiety with mobility requiring cues for PLB throughout session and increased wait time.    Pertinent Vitals/ Pain       Pain Assessment Pain Assessment: 0-10 Pain Score: 10-Worst pain ever Pain Descriptors / Indicators: Grimacing, Aching, Operative site guarding Pain Intervention(s): Limited activity within patient's tolerance, Monitored during session, Repositioned, RN gave pain meds during session  Home Living                                          Prior Functioning/Environment              Frequency  Min 2X/week        Progress Toward Goals  OT Goals(current goals can now be found in the care plan section)  Progress towards OT goals: Progressing toward goals  Acute Rehab OT Goals Patient Stated Goal: to decrease pain OT Goal Formulation: With patient Time For Goal Achievement: 07/15/24 Potential to Achieve Goals: Fair ADL Goals Pt Will Perform Grooming: with set-up;sitting Pt Will Perform Lower Body Dressing: with min assist;with adaptive equipment;sit to/from stand;sitting/lateral leans Pt Will Transfer to Toilet: with mod assist;stand pivot transfer Additional ADL Goal #1: Pt will perform bed mobility with min A as a precursor to ADL  Plan      Co-evaluation    PT/OT/SLP Co-Evaluation/Treatment: Yes Reason for Co-Treatment: Complexity of the patient's impairments (multi-system involvement);Necessary to address cognition/behavior during  functional activity;For patient/therapist safety;To address functional/ADL transfers PT goals addressed during session: Mobility/safety with mobility;Balance;Proper use of DME;Strengthening/ROM OT goals addressed during session: ADL's and self-care;Proper use of Adaptive equipment and DME;Strengthening/ROM      AM-PAC OT 6 Clicks Daily Activity     Outcome Measure   Help from another person eating meals?: A Little Help from another person taking care of personal grooming?: A Little Help from another person toileting, which includes using toliet, bedpan, or urinal?: Total Help from another person bathing (including washing, rinsing, drying)?: A Lot Help from another person to put on and taking off regular upper body clothing?: A Little Help from another person to put on and taking off regular lower body clothing?: Total 6 Click Score: 13    End of Session Equipment Utilized During Treatment: Oxygen ;Gait belt  OT Visit Diagnosis: Unsteadiness on feet (R26.81);Muscle weakness (generalized) (M62.81);History of falling (Z91.81);Pain Pain - Right/Left: Right Pain - part of body: Hip   Activity Tolerance Patient limited by pain;Other (comment) (Limited by anxiety)   Patient Left in chair;with call bell/phone within reach;with chair alarm set   Nurse Communication Mobility status;Other (comment) (Pt requiring  new purewick)        Time: 8873-8775 OT Time Calculation (min): 58 min  Charges: OT General Charges $OT Visit: 1 Visit OT Treatments $Therapeutic Activity: 23-37 mins  Maurilio CROME, OTR/L.  [facility]  Office: [phone]   Maurilio PARAS Chereese Cilento 07/10/2024, 1:41 PM

## 2024-07-10 NOTE — Progress Notes (Signed)
 Nutrition Follow-up  DOCUMENTATION CODES:   Non-severe (moderate) malnutrition in context of chronic illness  INTERVENTION:  Continue TPN via PICC, managed by Pharmacy  Recommend waiting until patient tolerating diet past full liquids prior to discontinuing/weaning  Continue full liquid diet, advance as tolerated/per GI Add Ensure Plus High Protein po BID, each supplement provides 350 kcal and 20 grams of protein. 100 mg thiamine IV for 5 days . Monitor K+, Phos and Mg x 3 days. Replete outside TPN as needed.    NUTRITION DIAGNOSIS:   Moderate Malnutrition related to chronic illness as evidenced by energy intake < 75% for > or equal to 1 month, mild fat depletion, moderate muscle depletion. - ongoing    GOAL:   Patient will meet greater than or equal to 90% of their needs - progressing     MONITOR:   PO intake, Supplement acceptance, Weight trends  REASON FOR ASSESSMENT:   Consult New TPN/TNA  ASSESSMENT:   medical history significant of COPD on chronic 2 L  CAD, PAD, HTN ,HLD, bilateral hip replacements presented with mechanical fall after she tripped on her oxygen  tubing resulting in right hip pain. 10/23 - PICC placed  10/23 - TPN started   PT in room working with patient, patient anxious/concerned about needing more oxygen . TPN started last night, running at 45 ml/hr. Pt started on clear liquids yesterday, had a BM and advanced to full liquids this morning. Repeat KUB this morning, results pending. NGT clamped yesterday, possible removal today. Given patient with prolonged ileus over the past week, recommend continue TPN through the weekend and monitor for tolerance of diet advancement before discontinuing.    Admit weight: 61.2 kg  Current weight: 66.7 kg  Nutritionally Relevant Medications: Dulcolax,  SSI 0-9 units q 6 hrs, lactulose , miralax , thiamine 100 mg  Labs Reviewed: Glu 151     NUTRITION - FOCUSED PHYSICAL EXAM:  Flowsheet Row Most Recent Value   Orbital Region Mild depletion  Upper Arm Region Moderate depletion  Thoracic and Lumbar Region Unable to assess  [pt with back pain]  Buccal Region Mild depletion  Temple Region Moderate depletion  Clavicle Bone Region Moderate depletion  Clavicle and Acromion Bone Region Moderate depletion  Scapular Bone Region Unable to assess  [pt with back pain]  Dorsal Hand Moderate depletion  Patellar Region Moderate depletion  Anterior Thigh Region Moderate depletion  Posterior Calf Region Mild depletion  Hair Reviewed  Eyes Reviewed  Mouth Reviewed  Skin Reviewed  Nails Reviewed    Diet Order:   Diet Order             Diet full liquid Room service appropriate? Yes; Fluid consistency: Thin  Diet effective now                   EDUCATION NEEDS:   Education needs have been addressed  Skin:  Skin Assessment: Reviewed RN Assessment  Last BM:  10/23  Height:   Ht Readings from Last 1 Encounters:  07/03/24 5' 7.5 (1.715 m)    Weight:   Wt Readings from Last 1 Encounters:  07/10/24 66.7 kg    BMI:  Body mass index is 22.69 kg/m.  Estimated Nutritional Needs:   Kcal:  1530-1836 kcals  Protein:  61-91 grams  Fluid:  1.5-1.8L/d  Madalyn Potters, MS, RD, LDN Clinical Dietitian  Contact via secure chat. If unavailable, use group chat RD Inpatient.

## 2024-07-10 NOTE — Progress Notes (Signed)
 [facility] Progress Note  SUBJECTIVE:   Interval history: Marie Johnson was seen and evaluated today at bedside.  Resting in bed, appears somewhat uncomfortable from pain, she requests pain medication, I relayed this message to the nurse.  Per discussion with nursing staff, patient's NG tube has been clamped, she had tolerated clear liquids overnight.  She had bowel movement overnight.  Abdominal x-ray this morning is pending though on my evaluation appears improved from abdominal x-ray on 07/07/2024.  TPN is running.  Past Medical History:  Diagnosis Date   AAA (abdominal aortic aneurysm)    3.4 cm   Anemia    Anxiety    Aortic atherosclerosis    Arthritis    Asthma    CAD (coronary artery disease)    a. Nonobstructive CAD by cath 07/17/13.   Cancer [place])    Carotid artery disease    Carotid US  8/22: Bilateral ICA 1-39; right subclavian stenosis; right thyroid  nodule   CHD (congenital heart disease)    pt unaware???   Chest pain    a. Adm 10-07/2013 - CTA neg for PE/dissection, cath with nonobstructive disease, CP ?due to uncontrolled HTN vs coronary vasospasm.   CHF (congestive heart failure) (HCC)    Closed head injury with concussion    COPD (chronic obstructive pulmonary disease) (HCC)    Coronary artery disease    Echocardiogram    Echocardiogram 06/2019: EF 60-65, impaired relaxation (Gr 1 DD), mild MAC, mild MR, mild to mod aortic valve sclerosis (no AS), trivial TR, mild LAE, normal RVSF, RVSP 30.7 (mildly elevated)   GERD (gastroesophageal reflux disease)    History of blood transfusion    childbirth, Hysterectomy   Hyperlipidemia    Hypertension    Neuropathy    OA (osteoarthritis)    Osteoporosis    PAD (peripheral artery disease)    a. s/p L fem-pop bypass b. recent evaluation at [facility], unamenable to intervention   PAD (peripheral artery disease)    Palpitations 02/22/2016   Pneumonia    Pre-diabetes    Premature ventricular contraction    PUD  (peptic ulcer disease)    PVD (peripheral vascular disease) 07/17/2013   Thoracic aortic aneurysm    Chest/aorta CTA 04/2022: 14 x 8 focal saccular aneurysm versus penetrating atherosclerotic ulcer involving left side of proximal portion of transverse aortic arch; descending thoracic aorta and proximal abdominal aorta aneurysm 4.4 cm; aortic atherosclerosis; emphysema   Tobacco abuse    Wears dentures    Wears glasses    Past Surgical History:  Procedure Laterality Date   ABDOMINAL AORTOGRAM W/LOWER EXTREMITY N/A 03/09/2020   Procedure: ABDOMINAL AORTOGRAM W/LOWER EXTREMITY;  Surgeon: Gretta Lonni PARAS, MD;  Location: [facility] INVASIVE CV LAB;  Service: Cardiovascular;  Laterality: N/A;   ABDOMINAL AORTOGRAM W/LOWER EXTREMITY N/A 07/26/2022   Procedure: ABDOMINAL AORTOGRAM W/LOWER EXTREMITY;  Surgeon: Gretta Lonni PARAS, MD;  Location: [facility] CV [facility];  Service: Cardiovascular;  Laterality: N/A;   ABDOMINAL AORTOGRAM W/LOWER EXTREMITY N/A 09/20/2022   Procedure: ABDOMINAL AORTOGRAM W/LOWER EXTREMITY;  Surgeon: Gretta Lonni PARAS, MD;  Location: [facility] CV [facility];  Service: Cardiovascular;  Laterality: N/A;   ABDOMINAL HYSTERECTOMY     BREAST SURGERY     left cyst excision x 2   CARDIAC CATHETERIZATION     several years ago, nonobstructive, 50-60% blockages   DIRECT LARYNGOSCOPY Bilateral 03/13/2023   Procedure: MICRO DIRECT LARYNGOSCOPY WITH BIOPSY;  Surgeon: Carlie Clark, MD;  Location: [facility] OR;  Service: ENT;  Laterality:  Bilateral;   ENDARTERECTOMY FEMORAL Right 03/18/2020   Procedure: ENDARTERECTOMY FEMORAL WITH PROFUNDAPLASTY;  Surgeon: Gretta Lonni PARAS, MD;  Location: [facility] OR;  Service: Vascular;  Laterality: Right;   FEMORAL-POPLITEAL BYPASS GRAFT Right 03/18/2020   Procedure: BYPASS GRAFT FEMORAL-POPLITEAL ARTERY using GORE PROPATEN VASCULAR GRAFT;  Surgeon: Gretta Lonni PARAS, MD;  Location: [facility] OR;  Service: Vascular;  Laterality: Right;   HIP SURGERY     left - bars and screws  placed   HIP SURGERY     INSERTION OF ILIAC STENT Right 03/18/2020   Procedure: INSERTION OF GORE VIABAHN  ILIAC STENT;  Surgeon: Gretta Lonni PARAS, MD;  Location: [facility] OR;  Service: Vascular;  Laterality: Right;   LEFT HEART CATH AND CORONARY ANGIOGRAPHY N/A 04/08/2023   Procedure: LEFT HEART CATH AND CORONARY ANGIOGRAPHY;  Surgeon: Anner Alm ORN, MD;  Location: [facility] INVASIVE CV LAB;  Service: Cardiovascular;  Laterality: N/A;   LEFT HEART CATHETERIZATION WITH CORONARY ANGIOGRAM N/A 07/17/2013   Procedure: LEFT HEART CATHETERIZATION WITH CORONARY ANGIOGRAM;  Surgeon: Ezra GORMAN Shuck, MD;  Location: [facility] CATH LAB;  Service: Cardiovascular;  Laterality: N/A;   LOWER EXTREMITY ANGIOGRAM Right 03/18/2020   Procedure: RIGHT ILIAC ARTERIOGRAM WITH AN ILIAC STENT;  Surgeon: Gretta Lonni PARAS, MD;  Location: [facility] OR;  Service: Vascular;  Laterality: Right;   MULTIPLE TOOTH EXTRACTIONS     PATCH ANGIOPLASTY Right 03/18/2020   Procedure: PATCH ANGIOPLASTY using a GEORGE BIOLOGIC PATCH OF THE PROFUNDA;  Surgeon: Gretta Lonni PARAS, MD;  Location: [facility] OR;  Service: Vascular;  Laterality: Right;   PERIPHERAL VASCULAR CATHETERIZATION N/A 07/16/2016   Procedure: Abdominal Aortogram w/Lower Extremity;  Surgeon: Krystal JULIANNA Doing, MD;  Location: [facility] INVASIVE CV LAB;  Service: Cardiovascular;  Laterality: N/A;   PERIPHERAL VASCULAR INTERVENTION Left 03/09/2020   Procedure: PERIPHERAL VASCULAR INTERVENTION;  Surgeon: Gretta Lonni PARAS, MD;  Location: [facility] INV[facility] CV LAB;  Service: Cardiovascular;  Laterality: Left;  common and external illiac    ROTATOR CUFF REPAIR     right side, had torn ligaments as well   TUBAL LIGATION     VEIN BYPASS SURGERY     left leg   WRIST ARTHROCENTESIS     left - broke in 3 places so has bars and screws placed   Current Facility-Administered Medications  Medication Dose Route Frequency Provider Last Rate Last Admin   acetaminophen  (TYLENOL ) tablet 1,000 mg  1,000 mg Oral Q8H Perri DELENA Meliton Mickey., MD   1,000 mg at 07/09/24 1532   Followed by   NOREEN ON 07/14/2024] acetaminophen  (TYLENOL ) tablet 650 mg  650 mg Oral Q6H PRN Perri DELENA Meliton Mickey., MD       albuterol  (PROVENTIL ) (2.5 MG/3ML) 0.083% nebulizer solution 2.5 mg  2.5 mg Nebulization Q2H PRN Ezenduka, Nkeiruka J, MD   2.5 mg at 07/10/24 9372   allopurinol  (ZYLOPRIM ) tablet 300 mg  300 mg Oral QPM Ezenduka, Nkeiruka J, MD   300 mg at 07/09/24 1737   Ampicillin-Sulbactam (UNASYN) 3 g in sodium chloride  0.9 % 100 mL IVPB  3 g Intravenous Q6H Billy Rocky SAUNDERS, RPH 200 mL/hr at 07/10/24 0523 3 g at 07/10/24 0523   arformoterol (BROVANA) nebulizer solution 15 mcg  15 mcg Nebulization BID Perri DELENA Meliton Mickey., MD   15 mcg at 07/10/24 0819   aspirin  EC tablet 81 mg  81 mg Oral q AM Perri DELENA Meliton Mickey., MD   81 mg at 07/10/24 1013   bisacodyl  (DULCOLAX) suppository 10 mg  10 mg Rectal BID Karki, Arya, MD   10 mg at 07/09/24 9051   budesonide  (PULMICORT ) nebulizer solution 0.25 mg  0.25 mg Nebulization BID Perri DELENA Meliton Mickey., MD   0.25 mg at 07/10/24 9180   Chlorhexidine  Gluconate Cloth 2 % PADS 6 each  6 each Topical Daily Danton Reyes DASEN, MD   6 each at 07/10/24 1014   dextrose  5 % in lactated ringers  infusion   Intravenous Continuous Chen, Lydia D, RPH 35 mL/hr at 07/09/24 1735 New Bag at 07/09/24 1735   enoxaparin  (LOVENOX ) injection 40 mg  40 mg Subcutaneous Daily Perri DELENA Meliton Mickey., MD   40 mg at 07/10/24 1013   HYDROmorphone  (DILAUDID ) injection 0.5-1 mg  0.5-1 mg Intravenous Q2H PRN Perri DELENA Meliton Mickey., MD   1 mg at 07/10/24 1013   insulin aspart (novoLOG) injection 0-9 Units  0-9 Units Subcutaneous Q6H Chen, Lydia D, RPH   1 Units at 07/10/24 9355   ipratropium-albuterol  (DUONEB) 0.5-2.5 (3) MG/3ML nebulizer solution 3 mL  3 mL Nebulization BID Danton Reyes T, MD   3 mL at 07/10/24 9180   lactulose  (CHRONULAC ) 10 GM/15ML solution 30 g  30 g Oral TID Karki, Arya, MD   30 g at 07/09/24 2234    lidocaine  (LIDODERM ) 5 % 2 patch  2 patch Transdermal Q24H Perri DELENA Meliton Mickey., MD   2 patch at 07/09/24 1212   melatonin tablet 3 mg  3 mg Oral QHS PRN Doutova, Anastassia, MD   3 mg at 07/03/24 2023   methocarbamol (ROBAXIN) tablet 500 mg  500 mg Oral Q6H PRN Doutova, Anastassia, MD   500 mg at 07/05/24 9373   Or   methocarbamol (ROBAXIN) injection 500 mg  500 mg Intravenous Q6H PRN Doutova, Anastassia, MD   500 mg at 07/09/24 0218   ondansetron  (ZOFRAN ) injection 4 mg  4 mg Intravenous Q6H PRN Daniels, James K, NP   4 mg at 07/05/24 2143   oxyCODONE  (Oxy IR/ROXICODONE ) immediate release tablet 10 mg  10 mg Oral Q4H PRN Perri DELENA Meliton Mickey., MD   10 mg at 07/05/24 0000   Or   oxyCODONE  (Oxy IR/ROXICODONE ) immediate release tablet 15 mg  15 mg Oral Q4H PRN Perri DELENA Meliton Mickey., MD   15 mg at 07/09/24 2235   pantoprazole  (PROTONIX ) injection 40 mg  40 mg Intravenous Q12H Perri DELENA Meliton Mickey., MD   40 mg at 07/10/24 1013   phenol (CHLORASEPTIC) mouth spray 1 spray  1 spray Mouth/Throat PRN Perri DELENA Meliton Mickey., MD   1 spray at 07/07/24 2002   polyethylene glycol (MIRALAX  / GLYCOLAX ) packet 17 g  17 g Oral BID Perri DELENA Meliton Mickey., MD   17 g at 07/09/24 2234   simethicone (MYLICON) chewable tablet 80 mg  80 mg Oral Q6H PRN Chavez, Abigail, NP   80 mg at 07/06/24 2117   sodium chloride  flush (NS) 0.9 % injection 10-40 mL  10-40 mL Intracatheter Q12H Danton Reyes DASEN, MD   10 mL at 07/09/24 2002   sodium chloride  flush (NS) 0.9 % injection 10-40 mL  10-40 mL Intracatheter PRN Danton Reyes DASEN, MD       thiamine (VITAMIN B1) injection 100 mg  100 mg Intravenous Daily Chen, Lydia D, RPH   100 mg at 07/10/24 1015   TPN ADULT (ION)   Intravenous Continuous TPN Chen, Lydia D, RPH 45 mL/hr at 07/09/24 1752 New Bag at 07/09/24 1752   TPN ADULT (ION)  Intravenous Continuous TPN Chen, Lydia D, [facility]       umeclidinium bromide  (INCRUSE ELLIPTA ) 62.5 MCG/ACT 1 puff  1 puff Inhalation Daily Reome,  Earle J, RPH   1 puff at 07/10/24 0820   Facility-Administered Medications Ordered in Other Encounters  Medication Dose Route Frequency Provider Last Rate Last Admin   DOBUTamine  (DOBUTREX ) 1,000 mcg/mL in dextrose  5% 250 mL infusion  30 mcg/kg/min Intravenous Titrated Maranda Leim DEL, MD       Allergies as of 06/29/2024 - Review Complete 06/29/2024  Allergen Reaction Noted   Bee venom Anaphylaxis and Other (See Comments) 07/17/2013   Insect extract Anaphylaxis and Other (See Comments) 03/08/2020   Farxiga  [dapagliflozin ] Nausea And Vomiting 06/11/2022   Lyrica [pregabalin] Nausea And Vomiting 06/11/2022   Neurontin [gabapentin] Nausea And Vomiting and Other (See Comments) 11/09/2021   Nsaids Nausea Only and Other (See Comments) 03/08/2020   Latex Rash and Other (See Comments) 07/12/2016   Review of Systems:  Review of Systems  Gastrointestinal:  Positive for abdominal pain. Negative for nausea and vomiting.  Musculoskeletal:  Positive for joint pain.    OBJECTIVE:   Temp:  [97.6 F (36.4 C)-97.8 F (36.6 C)] 97.8 F (36.6 C) (10/24 0814) Pulse Rate:  [66-109] 106 (10/24 0824) Resp:  [17-19] 18 (10/24 0824) BP: (123-139)/(54-83) 139/74 (10/24 0814) SpO2:  [96 %-100 %] 100 % (10/24 0824) FiO2 (%):  [40 %] 40 % (10/23 2100) Weight:  [66.7 kg] 66.7 kg (10/24 0500) Last BM Date : 07/09/24 Physical Exam Constitutional:      General: She is not in acute distress.    Appearance: She is ill-appearing. She is not toxic-appearing.  Cardiovascular:     Rate and Rhythm: Rhythm irregular.  Pulmonary:     Breath sounds: Normal breath sounds.     Comments: Supplemental oxygen  nasal cannula Abdominal:     General: Bowel sounds are normal. There is distension.     Palpations: Abdomen is soft.     Tenderness: There is no abdominal tenderness.  Neurological:     Mental Status: She is alert.     Labs: Recent Labs    07/08/24 0531 07/09/24 0533  WBC 8.9 8.1  HGB 8.8* 8.6*  HCT  28.4* 28.0*  PLT 411* 385   BMET Recent Labs    07/08/24 0531 07/09/24 0533 07/10/24 0355  NA 135 141 139  K 4.5 4.2 3.6  CL 102 103 103  CO2 25 29 29   GLUCOSE 95 76 151*  BUN 15 8 14   CREATININE 0.56 0.53 0.65  CALCIUM  8.2* 8.1* 8.1*   LFT Recent Labs    07/09/24 0533 07/10/24 0355  PROT 6.0*  --   ALBUMIN  2.2* 2.1*  AST 20  --   ALT 12  --   ALKPHOS 52  --   BILITOT 1.3*  --    PT/INR No results for input(s): LABPROT, INR in the last 72 hours. Diagnostic imaging: US  EKG SITE RITE Result Date: 07/08/2024 If Site Rite image not attached, placement could not be confirmed due to current cardiac rhythm.  IMPRESSION: Colonic ileus, NG tube (clamped since 07/09/2024 AM) Protein calorie malnutrition, now on TPN Right hip fracture, narcotics for analgesia History of chronic idiopathic constipation Chronic obstructive pulmonary disease  PLAN: -Medium brown stool noted overnight at 0556 -Maintain NG tube clamped for now, advance to full liquid diet, possibly remove NG tube this afternoon -TPN per primary team -Await results of abdominal x-ray -[facility] will follow -Talked  about above with patient's son, Beryl, via telephone - understanding of plan, will reach out to IM team about Bobby's concerns regarding appropriateness of patient to return home    LOS: 11 days   Estefana Keas, [facility]

## 2024-07-10 NOTE — Progress Notes (Signed)
 Physical Therapy Treatment  Patient Details Name: Marie Johnson MRN: [id number] DOB: 19-Nov-1950 Today's Date: 07/10/2024   History of Present Illness Pt is a 73 y/o female who presents 06/29/2024 s/p fall at home. She sustained a R periprosthetic femoral shaft fracture. Ortho consulted and plan is for non-op management; WBAT RLE. PMH significant for AAA, asthma, CAD, CA, CHF, COPD on 2L/min O2 at baseline, HTN, neuropathy, OA, osteoporosis, PVC's, PVD, fem-pop 2021, R rotator cuff repair.    PT Comments  Pt progressing slowly towards physical therapy goals. Max encouragement provided for pt participation this session, as pt has been refusing OOB since initial evaluation 8 days ago. Pt  little more motivated today and eventually agreeable to OOB with Stedy. +2 assist continues to be required for all aspects of mobility due to pain, weakness, and anxiety. Acute PT recommendations remain appropriate and unchanged. Will continue to follow and progress as able per POC.    If plan is discharge home, recommend the following: Two people to help with walking and/or transfers;Two people to help with bathing/dressing/bathroom;Assistance with cooking/housework;Help with stairs or ramp for entrance;Assist for transportation   Can travel by private vehicle     No  Equipment Recommendations  Wheelchair (measurements PT);Wheelchair cushion (measurements PT)    Recommendations for Other Services       Precautions / Restrictions Precautions Precautions: Fall Recall of Precautions/Restrictions: Impaired Precaution/Restrictions Comments: NG tube Restrictions Weight Bearing Restrictions Per Provider Order: Yes RLE Weight Bearing Per Provider Order: Weight bearing as tolerated     Mobility  Bed Mobility Overal bed mobility: Needs Assistance Bed Mobility: Supine to Sit     Supine to sit: Max assist, +2 for physical assistance, HOB elevated, Used rails     General bed mobility comments: HOB  significantly elevated. +2 assist to helicopter pt around to EOB with bed pad. Min use of UE's on rails for assist.    Transfers Overall transfer level: Needs assistance Equipment used: Ambulation equipment used Transfers: Sit to/from Stand, Bed to chair/wheelchair/BSC Sit to Stand: Mod assist, +2 physical assistance, +2 safety/equipment, From elevated surface           General transfer comment: Pt stood with +2 mod assist to [place]. Increased time due to pt anxiety around breathing. Transfer via Lift Equipment: Stedy  Ambulation/Gait               General Gait Details: Unable to progress to gait training.   Stairs             Wheelchair Mobility     Tilt Bed    Modified Rankin (Stroke Patients Only)       Balance Overall balance assessment: Needs assistance Sitting-balance support: Feet supported, No upper extremity supported Sitting balance-Leahy Scale: Fair     Standing balance support: Bilateral upper extremity supported, Reliant on assistive device for balance Standing balance-Leahy Scale: Poor                              Communication Communication Communication: No apparent difficulties  Cognition Arousal: Alert Behavior During Therapy: Anxious   PT - Cognitive impairments: No apparent impairments                         Following commands: Intact      Cueing Cueing Techniques: Verbal cues, Gestural cues  Exercises      General Comments  Pertinent Vitals/Pain Pain Assessment Pain Assessment: 0-10 Pain Score: 10-Worst pain ever Pain Descriptors / Indicators: Grimacing, Aching, Operative site guarding Pain Intervention(s): Limited activity within patient's tolerance, Monitored during session, Repositioned    Home Living                          Prior Function            PT Goals (current goals can now be found in the care plan section) Acute Rehab PT Goals Patient Stated Goal:  Decrease pain PT Goal Formulation: With patient Time For Goal Achievement: 07/15/24 Potential to Achieve Goals: Fair Progress towards PT goals: Progressing toward goals    Frequency    Min 2X/week      PT Plan      Co-evaluation PT/OT/SLP Co-Evaluation/Treatment: Yes Reason for Co-Treatment: Complexity of the patient's impairments (multi-system involvement);Necessary to address cognition/behavior during functional activity;For patient/therapist safety;To address functional/ADL transfers PT goals addressed during session: Mobility/safety with mobility;Balance;Proper use of DME;Strengthening/ROM        AM-PAC PT 6 Clicks Mobility   Outcome Measure  Help needed turning from your back to your side while in a flat bed without using bedrails?: Total Help needed moving from lying on your back to sitting on the side of a flat bed without using bedrails?: Total Help needed moving to and from a bed to a chair (including a wheelchair)?: Total Help needed standing up from a chair using your arms (e.g., wheelchair or bedside chair)?: Total Help needed to walk in hospital room?: Total Help needed climbing 3-5 steps with a railing? : Total 6 Click Score: 6    End of Session Equipment Utilized During Treatment: Gait belt;Oxygen  Activity Tolerance: Other (comment) (Limited by anxiety) Patient left: in chair;with call bell/phone within reach;with chair alarm set Nurse Communication: Mobility status;Need for lift equipment (Use of Stedy, Needs Purewick) PT Visit Diagnosis: Pain;Difficulty in walking, not elsewhere classified (R26.2);History of falling (Z91.81) Pain - Right/Left: Right Pain - part of body: Hip     Time: 1126-1224 PT Time Calculation (min) (ACUTE ONLY): 58 min  Charges:    $Gait Training: 23-37 mins PT General Charges $$ ACUTE PT VISIT: 1 Visit                     Leita Sable, PT, DPT Acute Rehabilitation Services Secure Chat Preferred Office: [phone]     [name] 07/10/2024, 12:52 PM

## 2024-07-10 NOTE — Progress Notes (Signed)
 PHARMACY - TOTAL PARENTERAL NUTRITION CONSULT NOTE   Indication: Prolonged ileus  Patient Measurements: Height: 5' 7.5 (171.5 cm) Weight: 66.7 kg (147 lb 0.8 oz) IBW/kg (Calculated) : 62.75 TPN AdjBW (KG): 61.2 Body mass index is 22.69 kg/m. Usual Weight: 140lbs per pt, 135 lbs per chart review   Assessment:  73 yo W admitted with mechanical fall and right femur fracture 10/13 and then found to have ileus on KUB 10/16. Pt was switched to clear liquid diet 10/17-10/20 but vomited what she tried drink. Patient has been NPO since so has had no intake for 7 days. She had N/V and required NG tube placement 10/20 PM with brown particulate output and improvement in N/V. The last meal patient remembers eating is a ham and beef sandwich on 10/13. She normally eats 2-3 meals a day - oatmeal or grits for breakfast (doesn't like eggs), sandwiches and meals from meals on wheels. She is not a big eater but is very hungry as of 10/23 and has liked vanilla ensures in the past. She has been on D5LR since 10/20 PM, receiving 90g dextrose /24hr, which will help decrease refeeding risk. Pharmacy consulted for TPN.   Glucose / Insulin: BG <160, used 3 units SSI/24hr; no hx DM Electrolytes: K 3.6 down, CoCa 9.6, others wnl  Renal: Scr 0.6, BUN wnl Hepatic: Alk phos/AST/ALT wnl, Tbili 1.3, albumin  2.1, TG 68 Intake / Output; MIVF: UOP x2 charted, NG not charted (had about in canister at 10am, clamped in PM), LBM 10/23 type 7x2  GI Imaging: 10/16 KUB: Diffuse bowel gas distension throughout the abdomen, possible ileus  10/17 CT: Gaseous distention of the colon without discrete focal transition point or mass lesion likely representing ileus 10/20 KUB: Persistent gaseous dilatation of the large bowel 10/21 KUB: moderate diffuse bowel distension, consistent with ileus 10/24 KUB:  Gaseous distension of large and small bowel consistent with ileus, slightly improved from prior. GI Surgeries / Procedures:   none  Central access: PICC 10/23  TPN start date: 10/23  Nutritional Goals: Goal TPN rate is 65 mL/hr (provides 81 g of protein and 1673 kcals per day)  RD Assessment: Estimated Needs Total Energy Estimated Needs: 1530-1836 kcals Total Protein Estimated Needs: 61-91 grams Total Fluid Estimated Needs: 1.5-1.8L/d  Current Nutrition:  TPN 10/23 CLD  Plan:  Advance TPN to goal 65 mL/hr at 1800, provides 100% estimated needs  Electrolytes in TPN: Na 125 mEq/L, increase K 40 mEq/L, Ca 2 mEq/L, Mg 7 mEq/L, and Phos 10 mmol/L. Cl:Ac 1:1 Add standard MVI and trace elements to TPN Continue Sensitive q6h SSI and stop if not needed  Thiamine 100mg /d x5 Stop MIVF 35 mL/hr at 1800 Additional fluids per MD Monitor TPN labs daily until stable at goal then on Mon/Thurs   Jinnie Door, PharmD, BCPS, [facility] Clinical Pharmacist  Please check AMION for all [facility] Pharmacy phone numbers After 10:00 PM, call Main Pharmacy [phone]

## 2024-07-10 NOTE — Progress Notes (Signed)
 Marie Johnson  FMW:[id number] DOB: 1951-01-07 DOA: 06/29/2024 PCP: Stephanie Charlene CROME, MD    Brief Narrative:  73 year old with a history of COPD on 2 L nasal cannula home oxygen , CAD, PAD, HTN, HLD, and prior bilateral hip replacements who presented to the ER 10/13 after a mechanical fall with subsequent hip pain ultimately found to be due to an acute periprosthetic right femur fracture.  She has been evaluated by orthopedic surgery who recommended nonoperative management.  Her hospital stay has been complicated by colonic ileus.  Goals of Care:   Code Status: Full Code   DVT prophylaxis: enoxaparin  (LOVENOX ) injection 40 mg Start: 07/06/24 1000 SCDs Start: 06/29/24 2211   Interim Hx: Patient tolerated the clamping of her NG overnight with a trial of clear liquids.  Afebrile.  Vital signs stable.  No new complaints at the time of visit.  Is working with physical therapy, but not necessarily happy about it.  Assessment & Plan:  Acute periprosthetic right femur fracture status post mechanical fall Orthopedics has recommended nonoperative management with WBAT with RW - patient to follow-up with her Orthopedist in [place] after discharge  Colonic ileus Confirmed on KUB 10/16 - CT without evidence of a clear transition point or mass - GI managing - continue aggressive bowel regimen - has tolerated clamping of NG since 10/23 so will d/c NG today - TNA ordered 10/22 and initiated 10/23, will continue 1 more night and then d/c tomorrow as long as she continues to tolerate a diet -follow-up KUB 10/24 noted improvement   Chronic hypoxic respiratory failure - COPD On 2 L nasal cannula oxygen  support at home -requiring more oxygen  support than her baseline due to abdominal distention/decreased abdominal compliance -encouraged use of incentive spirometer and getting up to chair frequently -weaning oxygen  to baseline as able  Possible aspiration pneumonitis Follow-up CXR 10/21 noted patchy densities  in the right lung with a small to moderate right size effusion and a small left effusion - in setting of ileus it is suspected that she has suffered an aspiration event  -possible right lung base organizing pneumonia noted on CT scan - follow-up as outpatient after completing course of antibiotic  Anemia of chronic disease - B12 deficiency Baseline hemoglobin approximately 10-11 -no evidence of significant bleeding -B12 modestly low at 371 -hemoglobin stable  Hypokalemia Corrected with supplementation  Hypomagnesemia Corrected with supplementation  History of CAD, PAD, and HLD Status post [facility] July 2024 with mild to moderate nonobstructive disease -was previously on DAPT for PAD -continue aspirin  and Lipitor  -Plavix  reportedly stopped 2 weeks ago   Chronic diastolic CHF Euvolemic at present -TTE noted EF 60-65% with no WMA  HTN Blood pressure controlled at present  Gout Continue usual allopurinol   Nutrition Problem: Moderate Malnutrition Etiology: chronic illness Signs/Symptoms: energy intake < 75% for > or equal to 1 month, mild fat depletion, moderate muscle depletion Interventions: Ensure Enlive (each supplement provides 350kcal and 20 grams of protein), MVI   Family Communication: No family present at time of exam today Disposition:   Skilled Nursing-Short Term Rehab (<3 Hours/Day)07/07/2024 1436  Objective: Blood pressure 139/74, pulse (!) 106, temperature 97.8 F (36.6 C), resp. rate 18, height 5' 7.5 (1.715 m), weight 66.7 kg, SpO2 100%.  Intake/Output Summary (Last 24 hours) at 07/10/2024 0901 Last data filed at 07/09/2024 2234 Gross per 24 hour  Intake 1201.04 ml  Output --  Net 1201.04 ml   Filed Weights   06/30/24 1100 07/03/24 2004 07/10/24 0500  Weight: 61.2 kg 61.2 kg 66.7 kg    Examination: General: No acute respiratory distress Lungs: Clear to auscultation bilaterally  Cardiovascular: Regular rate and rhythm without murmur  Abdomen: Soft, bowel  sounds positive, no rebound, only mildly distended Extremities: No significant cyanosis, clubbing, or edema bilateral lower extremities  CBC: Recent Labs  Lab 07/06/24 0613 07/06/24 1300 07/07/24 0445 07/08/24 0531 07/09/24 0533  WBC 10.3  --  8.5 8.9 8.1  NEUTROABS 8.7*  --  7.1 7.1  --   HGB 8.4*   < > 8.5* 8.8* 8.6*  HCT 26.0*   < > 26.8* 28.4* 28.0*  MCV 98.5  --  99.3 101.4* 100.4*  PLT 355  --  384 411* 385   < > = values in this interval not displayed.   Basic Metabolic Panel: Recent Labs  Lab 07/08/24 0531 07/09/24 0533 07/10/24 0355  NA 135 141 139  K 4.5 4.2 3.6  CL 102 103 103  CO2 25 29 29   GLUCOSE 95 76 151*  BUN 15 8 14   CREATININE 0.56 0.53 0.65  CALCIUM  8.2* 8.1* 8.1*  MG 2.2 1.9 2.0  PHOS 2.6 3.0 3.4   GFR: Estimated Creatinine Clearance: 60.2 mL/min (by C-G formula based on SCr of 0.65 mg/dL).   Scheduled Meds:  acetaminophen   1,000 mg Oral Q8H   allopurinol   300 mg Oral QPM   arformoterol  15 mcg Nebulization BID   aspirin  EC  81 mg Oral q AM   bisacodyl   10 mg Rectal BID   budesonide  (PULMICORT ) nebulizer solution  0.25 mg Nebulization BID   Chlorhexidine  Gluconate Cloth  6 each Topical Daily   enoxaparin  (LOVENOX ) injection  40 mg Subcutaneous Daily   insulin aspart  0-9 Units Subcutaneous Q6H   ipratropium-albuterol   3 mL Nebulization BID   lactulose   30 g Oral TID   lidocaine   2 patch Transdermal Q24H   pantoprazole  (PROTONIX ) IV  40 mg Intravenous Q12H   polyethylene glycol  17 g Oral BID   sodium chloride  flush  10-40 mL Intracatheter Q12H   sodium phosphate   1 enema Rectal Once   thiamine (VITAMIN B1) injection  100 mg Intravenous Daily   umeclidinium bromide   1 puff Inhalation Daily   Continuous Infusions:  ampicillin-sulbactam (UNASYN) IV 3 g (07/10/24 0523)   dextrose  5% lactated ringers  35 mL/hr at 07/09/24 1735   TPN ADULT (ION) 45 mL/hr at 07/09/24 1752   TPN ADULT (ION)       LOS: 11 days   Reyes IVAR Moores,  MD [facility] Office  [phone] Pager - Text Page per Tracey  If 7PM-7AM, please contact night-coverage per Amion 07/10/2024, 9:01 AM

## 2024-07-11 DIAGNOSIS — K567 Ileus, unspecified: Secondary | ICD-10-CM | POA: Diagnosis not present

## 2024-07-11 DIAGNOSIS — S72001G Fracture of unspecified part of neck of right femur, subsequent encounter for closed fracture with delayed healing: Secondary | ICD-10-CM | POA: Diagnosis not present

## 2024-07-11 LAB — RENAL FUNCTION PANEL
Albumin: 2.3 g/dL — ABNORMAL LOW (ref 3.5–5.0)
Anion gap: 9 (ref 5–15)
BUN: 18 mg/dL (ref 8–23)
CO2: 28 mmol/L (ref 22–32)
Calcium: 8.2 mg/dL — ABNORMAL LOW (ref 8.9–10.3)
Chloride: 97 mmol/L — ABNORMAL LOW (ref 98–111)
Creatinine, Ser: 0.58 mg/dL (ref 0.44–1.00)
GFR, Estimated: >60 mL/min (ref >60)
Glucose, Bld: 150 mg/dL — ABNORMAL HIGH (ref 70–99)
Phosphorus: 3.3 mg/dL (ref 2.5–4.6)
Potassium: 4 mmol/L (ref 3.5–5.1)
Sodium: 134 mmol/L — ABNORMAL LOW (ref 135–145)

## 2024-07-11 LAB — GLUCOSE, CAPILLARY
Glucose-Capillary: 147 mg/dL — ABNORMAL HIGH (ref 70–99)
Glucose-Capillary: 152 mg/dL — ABNORMAL HIGH (ref 70–99)
Glucose-Capillary: 124 mg/dL — ABNORMAL HIGH (ref 70–99)

## 2024-07-11 LAB — MAGNESIUM: Magnesium: 2.1 mg/dL (ref 1.7–2.4)

## 2024-07-11 MED ORDER — ADULT MULTIVITAMIN W/MINERALS CH
1.0000 | ORAL_TABLET | Freq: Every day | ORAL | Status: DC
  Filled 2024-07-11: qty 1

## 2024-07-11 MED ORDER — OXYCODONE HCL 5 MG/5ML PO SOLN
10.0000 mg | ORAL | Status: DC | PRN
  Administered 2024-07-11 – 2024-07-12 (×4): 15 mg via ORAL
  Filled 2024-07-11 (×4): qty 15

## 2024-07-11 MED ORDER — HYDROMORPHONE HCL 1 MG/ML IJ SOLN
0.5000 mg | INTRAMUSCULAR | Status: DC | PRN
  Administered 2024-07-11 – 2024-07-12 (×3): 0.5 mg via INTRAVENOUS
  Filled 2024-07-11 (×3): qty 0.5

## 2024-07-11 MED ORDER — TRAVASOL 10 % IV SOLN
INTRAVENOUS | Status: AC
  Filled 2024-07-11: qty 811.2

## 2024-07-11 MED ORDER — THIAMINE MONONITRATE 100 MG PO TABS
100.0000 mg | ORAL_TABLET | Freq: Every day | ORAL | Status: DC
  Administered 2024-07-11: 100 mg via ORAL
  Filled 2024-07-11: qty 1

## 2024-07-11 MED ORDER — PANTOPRAZOLE SODIUM 40 MG PO TBEC
40.0000 mg | DELAYED_RELEASE_TABLET | Freq: Two times a day (BID) | ORAL | Status: DC
  Administered 2024-07-11: 40 mg via ORAL
  Filled 2024-07-11 (×3): qty 1

## 2024-07-11 NOTE — Progress Notes (Signed)
 Marie Johnson  FMW:[id number] DOB: May 17, 1951 DOA: 06/29/2024 PCP: Stephanie Charlene CROME, MD    Brief Narrative:  73 year old with a history of COPD on 2 L nasal cannula home oxygen , CAD, PAD, HTN, HLD, and prior bilateral hip replacements who presented to the ER 10/13 after a mechanical fall with subsequent hip pain ultimately found to be due to an acute periprosthetic right femur fracture.  She has been evaluated by orthopedic surgery who recommended nonoperative management.  Her hospital stay has been complicated by colonic ileus.  Goals of Care:   Code Status: Full Code   DVT prophylaxis: enoxaparin  (LOVENOX ) injection 40 mg Start: 07/06/24 1000 SCDs Start: 06/29/24 2211   Interim Hx: NG removed yesterday afternoon.  No acute events recorded overnight.  Afebrile.  Vital signs stable.  Reports ongoing pain in her right leg but states it is not worse than when she came in just persistent.  Decreased abdominal pain.  Tolerating advancement of diet thus far.  Assessment & Plan:  Acute periprosthetic right femur fracture status post mechanical fall Orthopedics has recommended nonoperative management with WBAT with RW - patient to follow-up with her Orthopedist in [place] after discharge  Colonic ileus Confirmed on KUB 10/16 - CT without evidence of a clear transition point or mass - GI managing - continue aggressive bowel regimen - tolerated clamping of NG beginning 10/23 so NG was discontinued 10/24 - TNA ordered 10/22 and initiated 10/23, to discontinue 10/26 if continues to tolerate oral intake - follow-up KUB 10/24 noted improvement -tolerating advancement of diet thus far  Chronic hypoxic respiratory failure - COPD On 2 L nasal cannula oxygen  support at home -requiring more oxygen  support than her baseline due to abdominal distention/decreased abdominal compliance -encouraged use of incentive spirometer and getting up to chair frequently -weaning oxygen  to baseline as able  Possible  aspiration pneumonitis Follow-up CXR 10/21 noted patchy densities in the right lung with a small to moderate right size effusion and a small left effusion - in setting of ileus it is suspected that she has suffered an aspiration event  -possible right lung base organizing pneumonia noted on CT scan - follow-up as outpatient after completing course of antibiotic  Anemia of chronic disease - B12 deficiency Baseline hemoglobin approximately 10-11 -no evidence of significant bleeding -B12 modestly low at 371 -hemoglobin stable  Hypokalemia Corrected with supplementation  Hypomagnesemia Corrected with supplementation  History of CAD, PAD, and HLD Status post [facility] July 2024 with mild to moderate nonobstructive disease -was previously on DAPT for PAD -continue aspirin  and Lipitor  -Plavix  reportedly stopped 2 weeks ago   Chronic diastolic CHF Euvolemic at present -TTE noted EF 60-65% with no WMA  HTN Blood pressure controlled at present  Gout Continue usual allopurinol   Nutrition Problem: Moderate Malnutrition Etiology: chronic illness Signs/Symptoms: energy intake < 75% for > or equal to 1 month, mild fat depletion, moderate muscle depletion Interventions: Ensure Enlive (each supplement provides 350kcal and 20 grams of protein), MVI   Family Communication: No family present at time of exam today Disposition:   Skilled Nursing-Short Term Rehab (<3 Hours/Day)07/10/2024 1157  Objective: Blood pressure (!) 117/93, pulse (!) 116, temperature 97.6 F (36.4 C), temperature source Oral, resp. rate (!) 23, height 5' 7.5 (1.715 m), weight 66.7 kg, SpO2 97%.  Intake/Output Summary (Last 24 hours) at 07/11/2024 0921 Last data filed at 07/11/2024 0517 Gross per 24 hour  Intake 548.79 ml  Output 800 ml  Net -251.21 ml   [employer]  06/30/24 1100 07/03/24 2004 07/10/24 0500  Weight: 61.2 kg 61.2 kg 66.7 kg    Examination: General: No acute respiratory distress Lungs: Clear to  auscultation bilaterally  Cardiovascular: Regular rate and rhythm without murmur  Abdomen: Soft, bowel sounds positive, no rebound, mildly protuberant, no mass Extremities: No significant cyanosis, clubbing, or edema bilateral lower extremities  CBC: Recent Labs  Lab 07/06/24 0613 07/06/24 1300 07/07/24 0445 07/08/24 0531 07/09/24 0533  WBC 10.3  --  8.5 8.9 8.1  NEUTROABS 8.7*  --  7.1 7.1  --   HGB 8.4*   < > 8.5* 8.8* 8.6*  HCT 26.0*   < > 26.8* 28.4* 28.0*  MCV 98.5  --  99.3 101.4* 100.4*  PLT 355  --  384 411* 385   < > = values in this interval not displayed.   Basic Metabolic Panel: Recent Labs  Lab 07/08/24 0531 07/09/24 0533 07/10/24 0355  NA 135 141 139  K 4.5 4.2 3.6  CL 102 103 103  CO2 25 29 29   GLUCOSE 95 76 151*  BUN 15 8 14   CREATININE 0.56 0.53 0.65  CALCIUM  8.2* 8.1* 8.1*  MG 2.2 1.9 2.0  PHOS 2.6 3.0 3.4   GFR: Estimated Creatinine Clearance: 60.2 mL/min (by C-G formula based on SCr of 0.65 mg/dL).   Scheduled Meds:  acetaminophen   1,000 mg Oral Q8H   allopurinol   300 mg Oral QPM   arformoterol  15 mcg Nebulization BID   aspirin  EC  81 mg Oral q AM   bisacodyl   10 mg Rectal BID   budesonide  (PULMICORT ) nebulizer solution  0.25 mg Nebulization BID   Chlorhexidine  Gluconate Cloth  6 each Topical Daily   enoxaparin  (LOVENOX ) injection  40 mg Subcutaneous Daily   feeding supplement  237 mL Oral BID BM   ipratropium-albuterol   3 mL Nebulization BID   lactulose   30 g Oral TID   lidocaine   2 patch Transdermal Q24H   pantoprazole  (PROTONIX ) IV  40 mg Intravenous Q12H   polyethylene glycol  17 g Oral BID   sodium chloride  flush  10-40 mL Intracatheter Q12H   thiamine (VITAMIN B1) injection  100 mg Intravenous Daily   umeclidinium bromide   1 puff Inhalation Daily   Continuous Infusions:  ampicillin-sulbactam (UNASYN) IV 3 g (07/11/24 0511)   TPN ADULT (ION) 65 mL/hr at 07/11/24 0151     LOS: 12 days   Reyes IVAR Moores, MD [facility] Office  [phone] Pager - Text Page per Tracey  If 7PM-7AM, please contact night-coverage per Amion 07/11/2024, 9:21 AM

## 2024-07-11 NOTE — Progress Notes (Signed)
 PHARMACY - TOTAL PARENTERAL NUTRITION CONSULT NOTE   Indication: Prolonged ileus  Patient Measurements: Height: 5' 7.5 (171.5 cm) Weight: 66.7 kg (147 lb 0.8 oz) IBW/kg (Calculated) : 62.75 TPN AdjBW (KG): 61.2 Body mass index is 22.69 kg/m. Usual Weight: 140lbs per pt, 135 lbs per chart review   Assessment:  73 yo W admitted with mechanical fall and right femur fracture 10/13 and then found to have ileus on KUB 10/16. Pt was switched to clear liquid diet 10/17-10/20 but vomited what she tried drink. Patient has been NPO since so has had no intake for 7 days. She had N/V and required NG tube placement 10/20 PM with brown particulate output and improvement in N/V. The last meal patient remembers eating is a ham and beef sandwich on 10/13. She normally eats 2-3 meals a day - oatmeal or grits for breakfast (doesn't like eggs), sandwiches and meals from meals on wheels. She is not a big eater but is very hungry as of 10/23 and has liked vanilla ensures in the past. She has been on D5LR since 10/20 PM, receiving 90g dextrose /24hr, which will help decrease refeeding risk. Pharmacy consulted for TPN.   Glucose / Insulin: BG <160, used 6 units SSI/24hr; no hx DM Electrolytes: Na 134 down, Cl 97, CoCa 9.56, others wnl  Renal: Scr 0.6, BUN wnl Hepatic: Alk phos/AST/ALT wnl, Tbili 1.3, albumin  2.1, TG 68 Intake / Output; MIVF: UOP charted, LBM 10/23 type 7x2  GI Imaging: 10/16 KUB: Diffuse bowel gas distension throughout the abdomen, possible ileus  10/17 CT: Gaseous distention of the colon without discrete focal transition point or mass lesion likely representing ileus 10/20 KUB: Persistent gaseous dilatation of the large bowel 10/21 KUB: moderate diffuse bowel distension, consistent with ileus 10/24 KUB:  Gaseous distension of large and small bowel consistent with ileus, slightly improved from prior. GI Surgeries / Procedures:  10/24 NGT removed   Central access: PICC 10/23  TPN start  date: 10/23  Nutritional Goals: Goal TPN rate is 65 mL/hr (provides 81 g of protein and 1673 kcals per day)  RD Assessment: Estimated Needs Total Energy Estimated Needs: 1530-1836 kcals Total Protein Estimated Needs: 61-91 grams Total Fluid Estimated Needs: 1.5-1.8L/d  Current Nutrition:  TPN 10/23 CLD 10/24 FLD- had beef broth, cranberry juice, coffee, chocolate pudding, apple sauce, no Ensure 10/25 Soft - patient in a lot of pain this morning, not eaten anything yet, told RN she doesn't like Ensure and refused   Plan:  Discussed with MD, ok to continue TPN now and tonight to be sure patient can tolerate soft diet - order was Dc'd, cannot reorder without distribution issues. Spoke with RN who had not stopped TPN yet. Pharm to nursing instruction placed   Continue TPN at goal 65 mL/hr at 1800, provides 100% estimated needs  Electrolytes in TPN: increase Na 150 mEq/L, K 40 mEq/L, Ca 2 mEq/L, Mg 7 mEq/L, and Phos 10 mmol/L. Change Cl:Ac 2:1 Remove standard MVI and trace elements to TPN, give as po  Stop Sensitive q6h SSI and BG checks Stop Thiamine 100mg /d  Monitor TPN labs Mon/Thurs and PRN F/u PO intake, likely stop TPN 10/26  Jinnie Door, PharmD, BCPS, [facility] Clinical Pharmacist  Please check AMION for all [facility] Pharmacy phone numbers After 10:00 PM, call Main Pharmacy [phone]

## 2024-07-11 NOTE — Plan of Care (Signed)

## 2024-07-11 NOTE — Progress Notes (Signed)
 [facility] Progress Note  SUBJECTIVE:   Interval history: Marie Johnson was seen and evaluated today at bedside. Sitting in chair at bedside. Noted having increased work of breathing and wheezing, she requested breathing treatment, I relayed this request to patient's bedside nurse. Interval removal NG tube. Abdominal distention worse today, patient feeling more distended. No nausea or vomiting, with eructation. No passing flatus or BM today, though she believes she will pass BM this evening. Tolerating soft diet.   Past Medical History:  Diagnosis Date   AAA (abdominal aortic aneurysm)    3.4 cm   Anemia    Anxiety    Aortic atherosclerosis    Arthritis    Asthma    CAD (coronary artery disease)    a. Nonobstructive CAD by cath 07/17/13.   Cancer [facility])    Carotid artery disease    Carotid US  8/22: Bilateral ICA 1-39; right subclavian stenosis; right thyroid  nodule   CHD (congenital heart disease)    pt unaware???   Chest pain    a. Adm 10-07/2013 - CTA neg for PE/dissection, cath with nonobstructive disease, CP ?due to uncontrolled HTN vs coronary vasospasm.   CHF (congestive heart failure) (HCC)    Closed head injury with concussion    COPD (chronic obstructive pulmonary disease) (HCC)    Coronary artery disease    Echocardiogram    Echocardiogram 06/2019: EF 60-65, impaired relaxation (Gr 1 DD), mild MAC, mild MR, mild to mod aortic valve sclerosis (no AS), trivial TR, mild LAE, normal RVSF, RVSP 30.7 (mildly elevated)   GERD (gastroesophageal reflux disease)    History of blood transfusion    childbirth, Hysterectomy   Hyperlipidemia    Hypertension    Neuropathy    OA (osteoarthritis)    Osteoporosis    PAD (peripheral artery disease)    a. s/p L fem-pop bypass b. recent evaluation at [facility], unamenable to intervention   PAD (peripheral artery disease)    Palpitations 02/22/2016   Pneumonia    Pre-diabetes    Premature ventricular contraction    PUD (peptic  ulcer disease)    PVD (peripheral vascular disease) 07/17/2013   Thoracic aortic aneurysm    Chest/aorta CTA 04/2022: 14 x 8 focal saccular aneurysm versus penetrating atherosclerotic ulcer involving left side of proximal portion of transverse aortic arch; descending thoracic aorta and proximal abdominal aorta aneurysm 4.4 cm; aortic atherosclerosis; emphysema   Tobacco abuse    Wears dentures    Wears glasses    Past Surgical History:  Procedure Laterality Date   ABDOMINAL AORTOGRAM W/LOWER EXTREMITY N/A 03/09/2020   Procedure: ABDOMINAL AORTOGRAM W/LOWER EXTREMITY;  Surgeon: Gretta Lonni PARAS, MD;  Location: [facility] INVASIVE CV LAB;  Service: Cardiovascular;  Laterality: N/A;   ABDOMINAL AORTOGRAM W/LOWER EXTREMITY N/A 07/26/2022   Procedure: ABDOMINAL AORTOGRAM W/LOWER EXTREMITY;  Surgeon: Gretta Lonni PARAS, MD;  Location: [facility] INVASIVE CV LAB;  Service: Cardiovascular;  Laterality: N/A;   ABDOMINAL AORTOGRAM W/LOWER EXTREMITY N/A 09/20/2022   Procedure: ABDOMINAL AORTOGRAM W/LOWER EXTREMITY;  Surgeon: Gretta Lonni PARAS, MD;  Location: [facility] INVASIVE CV LAB;  Service: Cardiovascular;  Laterality: N/A;   ABDOMINAL HYSTERECTOMY     BREAST SURGERY     left cyst excision x 2   CARDIAC CATHETERIZATION     several years ago, nonobstructive, 50-60% blockages   DIRECT LARYNGOSCOPY Bilateral 03/13/2023   Procedure: MICRO DIRECT LARYNGOSCOPY WITH BIOPSY;  Surgeon: Carlie Clark, MD;  Location: [facility] OR;  Service: ENT;  Laterality: Bilateral;   ENDARTERECTOMY  FEMORAL Right 03/18/2020   Procedure: ENDARTERECTOMY FEMORAL WITH PROFUNDAPLASTY;  Surgeon: Gretta Lonni PARAS, MD;  Location: [facility] OR;  Service: Vascular;  Laterality: Right;   FEMORAL-POPLITEAL BYPASS GRAFT Right 03/18/2020   Procedure: BYPASS GRAFT FEMORAL-POPLITEAL ARTERY using GORE PROPATEN VASCULAR GRAFT;  Surgeon: Gretta Lonni PARAS, MD;  Location: [facility] OR;  Service: Vascular;  Laterality: Right;   HIP SURGERY     left - bars and screws placed    HIP SURGERY     INSERTION OF ILIAC STENT Right 03/18/2020   Procedure: INSERTION OF GORE VIABAHN  ILIAC STENT;  Surgeon: Gretta Lonni PARAS, MD;  Location: [facility] OR;  Service: Vascular;  Laterality: Right;   LEFT HEART CATH AND CORONARY ANGIOGRAPHY N/A 04/08/2023   Procedure: LEFT HEART CATH AND CORONARY ANGIOGRAPHY;  Surgeon: Anner Alm ORN, MD;  Location: [facility] INVASIVE CV LAB;  Service: Cardiovascular;  Laterality: N/A;   LEFT HEART CATHETERIZATION WITH CORONARY ANGIOGRAM N/A 07/17/2013   Procedure: LEFT HEART CATHETERIZATION WITH CORONARY ANGIOGRAM;  Surgeon: Ezra GORMAN Shuck, MD;  Location: [facility] CATH LAB;  Service: Cardiovascular;  Laterality: N/A;   LOWER EXTREMITY ANGIOGRAM Right 03/18/2020   Procedure: RIGHT ILIAC ARTERIOGRAM WITH AN ILIAC STENT;  Surgeon: Gretta Lonni PARAS, MD;  Location: [facility] OR;  Service: Vascular;  Laterality: Right;   MULTIPLE TOOTH EXTRACTIONS     PATCH ANGIOPLASTY Right 03/18/2020   Procedure: PATCH ANGIOPLASTY using a GEORGE BIOLOGIC PATCH OF THE PROFUNDA;  Surgeon: Gretta Lonni PARAS, MD;  Location: [facility] OR;  Service: Vascular;  Laterality: Right;   PERIPHERAL VASCULAR CATHETERIZATION N/A 07/16/2016   Procedure: Abdominal Aortogram w/Lower Extremity;  Surgeon: Krystal JULIANNA Doing, MD;  Location[facility] CV LAB;  Service: Cardiovascular;  Laterality: N/A;   PERIPHERAL VASCULAR INTERVENTION Left 03/09/2020   Procedure: PERIPHERAL VASCULAR INTERVENTION;  Surgeon: Gretta Lonni PARAS, MD;  Location: [facility] INVASIVE CV LAB;  Service: Cardiovascular;  Laterality: Left;  common and external illiac    ROTATOR CUFF REPAIR     right side, had torn ligaments as well   TUBAL LIGATION     VEIN BYPASS SURGERY     left leg   WRIST ARTHROCENTESIS     left - broke in 3 places so has bars and screws placed   Current Facility-Administered Medications  Medication Dose Route Frequency Provider Last Rate Last Admin   acetaminophen  (TYLENOL ) tablet 1,000 mg  1,000 mg Oral Q8H Perri DELENA Meliton Mickey., MD   1,000 mg at 07/11/24 1302   Followed by   NOREEN ON 07/14/2024] acetaminophen  (TYLENOL ) tablet 650 mg  650 mg Oral Q6H PRN Perri DELENA Meliton Mickey., MD       albuterol  (PROVENTIL ) (2.5 MG/3ML) 0.083% nebulizer solution 2.5 mg  2.5 mg Nebulization Q2H PRN Ezenduka, Nkeiruka J, MD   2.5 mg at 07/10/24 9372   allopurinol  (ZYLOPRIM ) tablet 300 mg  300 mg Oral QPM Ezenduka, Nkeiruka J, MD   300 mg at 07/11/24 1605   Ampicillin-Sulbactam (UNASYN) 3 g in sodium chloride  0.9 % 100 mL IVPB  3 g Intravenous Q6H Danton Purchase T, MD 200 mL/hr at 07/11/24 1310 3 g at 07/11/24 1310   arformoterol (BROVANA) nebulizer solution 15 mcg  15 mcg Nebulization BID Perri DELENA Meliton Mickey., MD   15 mcg at 07/11/24 9176   aspirin  EC tablet 81 mg  81 mg Oral q AM Perri DELENA Meliton Mickey., MD   81 mg at 07/11/24 0911   bisacodyl  (DULCOLAX) suppository 10 mg  10 mg Rectal  BID Karki, Arya, MD   10 mg at 07/09/24 9051   budesonide  (PULMICORT ) nebulizer solution 0.25 mg  0.25 mg Nebulization BID Perri DELENA Meliton Mickey., MD   0.25 mg at 07/11/24 9176   Chlorhexidine  Gluconate Cloth 2 % PADS 6 each  6 each Topical Daily Danton Reyes DASEN, MD   6 each at 07/11/24 0934   enoxaparin  (LOVENOX ) injection 40 mg  40 mg Subcutaneous Daily Perri DELENA Meliton Mickey., MD   40 mg at 07/11/24 0933   feeding supplement (ENSURE PLUS HIGH PROTEIN) liquid 237 mL  237 mL Oral BID BM Danton Reyes DASEN, MD   237 mL at 07/11/24 1310   HYDROmorphone  (DILAUDID ) injection 0.5 mg  0.5 mg Intravenous Q4H PRN Danton Reyes T, MD   0.5 mg at 07/11/24 1052   ipratropium-albuterol  (DUONEB) 0.5-2.5 (3) MG/3ML nebulizer solution 3 mL  3 mL Nebulization BID Danton Reyes DASEN, MD   3 mL at 07/11/24 9176   lactulose  (CHRONULAC ) 10 GM/15ML solution 30 g  30 g Oral TID Saintclair Jasper, MD   30 g at 07/09/24 2234   lidocaine  (LIDODERM ) 5 % 2 patch  2 patch Transdermal Q24H Perri DELENA Meliton Mickey., MD   2 patch at 07/09/24 1212   melatonin tablet 3 mg  3 mg Oral  QHS PRN Doutova, Anastassia, MD   3 mg at 07/10/24 2108   methocarbamol (ROBAXIN) tablet 500 mg  500 mg Oral Q6H PRN Doutova, Anastassia, MD   500 mg at 07/11/24 1302   Or   methocarbamol (ROBAXIN) injection 500 mg  500 mg Intravenous Q6H PRN Doutova, Anastassia, MD   500 mg at 07/10/24 1410   [START ON 07/12/2024] multivitamin with minerals tablet 1 tablet  1 tablet Oral Daily Chen, Lydia D, [facility]       ondansetron  (ZOFRAN ) injection 4 mg  4 mg Intravenous Q6H PRN Daniels, James K, NP   4 mg at 07/05/24 2143   oxyCODONE  (ROXICODONE ) 5 MG/5ML solution 10-15 mg  10-15 mg Oral Q4H PRN Danton Reyes DASEN, MD   15 mg at 07/11/24 1303   pantoprazole  (PROTONIX ) EC tablet 40 mg  40 mg Oral BID Danton Reyes DASEN, MD   40 mg at 07/11/24 0942   phenol (CHLORASEPTIC) mouth spray 1 spray  1 spray Mouth/Throat PRN Perri DELENA Meliton Mickey., MD   1 spray at 07/07/24 2002   polyethylene glycol (MIRALAX  / GLYCOLAX ) packet 17 g  17 g Oral BID Perri DELENA Meliton Mickey., MD   17 g at 07/11/24 9070   simethicone (MYLICON) chewable tablet 80 mg  80 mg Oral Q6H PRN Chavez, Abigail, NP   80 mg at 07/06/24 2117   sodium chloride  flush (NS) 0.9 % injection 10-40 mL  10-40 mL Intracatheter Q12H Danton Reyes DASEN, MD   10 mL at 07/11/24 0932   sodium chloride  flush (NS) 0.9 % injection 10-40 mL  10-40 mL Intracatheter PRN Danton Reyes DASEN, MD       TPN ADULT (ION)   Intravenous Continuous TPN Chen, Lydia D, RPH       umeclidinium bromide  (INCRUSE ELLIPTA ) 62.5 MCG/ACT 1 puff  1 puff Inhalation Daily Reome, Earle J, RPH   1 puff at 07/11/24 9171   Facility-Administered Medications Ordered in Other Encounters  Medication Dose Route Frequency Provider Last Rate Last Admin   DOBUTamine  (DOBUTREX ) 1,000 mcg/mL in dextrose  5% 250 mL infusion  30 mcg/kg/min Intravenous Titrated Maranda Leim DEL, MD  Allergies as of 06/29/2024 - Review Complete 06/29/2024  Allergen Reaction Noted   Bee venom Anaphylaxis and Other (See  Comments) 07/17/2013   Insect extract Anaphylaxis and Other (See Comments) 03/08/2020   Farxiga  [dapagliflozin ] Nausea And Vomiting 06/11/2022   Lyrica [pregabalin] Nausea And Vomiting 06/11/2022   Neurontin [gabapentin] Nausea And Vomiting and Other (See Comments) 11/09/2021   Nsaids Nausea Only and Other (See Comments) 03/08/2020   Latex Rash and Other (See Comments) 07/12/2016   Review of Systems:  Review of Systems  Respiratory:  Positive for shortness of breath and wheezing.   Cardiovascular:  Negative for chest pain.  Gastrointestinal:  Positive for abdominal pain. Negative for nausea and vomiting.    OBJECTIVE:   Temp:  [97.5 F (36.4 C)-97.9 F (36.6 C)] 97.5 F (36.4 C) (10/25 1657) Pulse Rate:  [70-116] 98 (10/25 1657) Resp:  [20-23] 20 (10/25 1657) BP: (117-139)/(65-93) 132/65 (10/25 1657) SpO2:  [97 %-100 %] 99 % (10/25 1657) FiO2 (%):  [36 %] 36 % (10/24 2229) Last BM Date : 07/10/24 Physical Exam Constitutional:      General: She is not in acute distress.    Appearance: She is ill-appearing.  Cardiovascular:     Rate and Rhythm: Normal rate and regular rhythm.  Pulmonary:     Effort: Respiratory distress (increased work of breathing) present.     Breath sounds: Wheezing present.     Comments: Supplemental oxygen  in place nasal cannula 4 liters Abdominal:     General: There is distension.     Palpations: Abdomen is soft.     Tenderness: There is no abdominal tenderness.     Comments: High pitched bowel sounds  Skin:    General: Skin is warm and dry.  Neurological:     Mental Status: She is alert.     Labs: Recent Labs    07/09/24 0533  WBC 8.1  HGB 8.6*  HCT 28.0*  PLT 385   BMET Recent Labs    07/09/24 0533 07/10/24 0355 07/11/24 0850  NA 141 139 134*  K 4.2 3.6 4.0  CL 103 103 97*  CO2 29 29 28   GLUCOSE 76 151* 150*  BUN 8 14 18   CREATININE 0.53 0.65 0.58  CALCIUM  8.1* 8.1* 8.2*   LFT Recent Labs    07/09/24 0533 07/10/24 0355  07/11/24 0850  PROT 6.0*  --   --   ALBUMIN  2.2*   < > 2.3*  AST 20  --   --   ALT 12  --   --   ALKPHOS 52  --   --   BILITOT 1.3*  --   --    < > = values in this interval not displayed.   PT/INR No results for input(s): LABPROT, INR in the last 72 hours. Diagnostic imaging: DG Abd 1 View Result Date: 07/10/2024 EXAM: 1 VIEW XRAY OF THE ABDOMEN 07/10/2024 07:26:00 AM COMPARISON: 07/07/2024 CLINICAL HISTORY: Ileus FINDINGS: LINES, TUBES AND DEVICES: Enteric tube in place with tip at the proximal stomach, side port near the GE junction. BOWEL: Gaseous distension of large and small bowel, consistent with ileus and slightly improved from prior. SOFT TISSUES: No opaque urinary calculi. Bilateral iliac stents noted. Extensive vascular calcifications. BONES: No acute osseous abnormality. Bilateral hip arthroplasties noted. LUNGS AND PLEURAL SPACES: Bibasilar atelectasis and small right pleural effusion. IMPRESSION: 1. Enteric tube in place with tip at the proximal stomach, side port near the GE junction. Advancement is recommended for optimal functioning. 2.  Gaseous distension of large and small bowel consistent with ileus, slightly improved from prior. Electronically signed by: Ryan Chess MD 07/10/2024 11:17 AM EDT RP Workstation: [id number]   IMPRESSION: Colonic ileus  -NG tube removed 07/10/24  -Last BM 07/10/24 0556 Protein calorie malnutrition, on TPN Right hip fracture, narcotics for analgesia History of chronic idiopathic constipation Chronic obstructive pulmonary disease, on 4 liters supplemental oxygen    PLAN: -Minimal progress today, NG tube is now removed, she is tolerating soft diet, though did not yet have BM or pass flatus today, will monitor -Bowel regimen with lactulose  30 gm PO TID, dulcolax suppository BID, Miralax  17 gm PO BID -Requested breathing treatment from nursing staff  -Avoidance of narcotics as able -Can consider repeat abdominal xray tomorrow AM   -Continue soft diet as tolerated   LOS: 12 days   Estefana Keas, [facility]

## 2024-07-12 ENCOUNTER — Inpatient Hospital Stay (HOSPITAL_COMMUNITY)

## 2024-07-12 DIAGNOSIS — R0603 Acute respiratory distress: Secondary | ICD-10-CM | POA: Diagnosis not present

## 2024-07-12 DIAGNOSIS — S72001G Fracture of unspecified part of neck of right femur, subsequent encounter for closed fracture with delayed healing: Secondary | ICD-10-CM | POA: Diagnosis not present

## 2024-07-12 DIAGNOSIS — K567 Ileus, unspecified: Secondary | ICD-10-CM | POA: Diagnosis not present

## 2024-07-12 DIAGNOSIS — W19XXXA Unspecified fall, initial encounter: Secondary | ICD-10-CM

## 2024-07-12 LAB — POCT I-STAT 7, (LYTES, BLD GAS, ICA,H+H)
Acid-Base Excess: 1 mmol/L (ref 0.0–2.0)
Bicarbonate: 26.2 mmol/L (ref 20.0–28.0)
Calcium, Ion: 1.24 mmol/L (ref 1.15–1.40)
HCT: 25 % — ABNORMAL LOW (ref 36.0–46.0)
Hemoglobin: 8.5 g/dL — ABNORMAL LOW (ref 12.0–15.0)
O2 Saturation: 100 %
Patient temperature: 97.7
Potassium: 3.7 mmol/L (ref 3.5–5.1)
Sodium: 139 mmol/L (ref 135–145)
TCO2: 28 mmol/L (ref 22–32)
pCO2 arterial: 45.1 mmHg (ref 32–48)
pH, Arterial: 7.37 (ref 7.35–7.45)
pO2, Arterial: 409 mmHg — ABNORMAL HIGH (ref 83–108)

## 2024-07-12 LAB — COMPREHENSIVE METABOLIC PANEL WITH GFR
ALT: 12 U/L (ref 0–44)
AST: 19 U/L (ref 15–41)
Albumin: 2.4 g/dL — ABNORMAL LOW (ref 3.5–5.0)
Alkaline Phosphatase: 74 U/L (ref 38–126)
Anion gap: 10 (ref 5–15)
BUN: 20 mg/dL (ref 8–23)
CO2: 27 mmol/L (ref 22–32)
Calcium: 8.4 mg/dL — ABNORMAL LOW (ref 8.9–10.3)
Chloride: 100 mmol/L (ref 98–111)
Creatinine, Ser: 0.51 mg/dL (ref 0.44–1.00)
GFR, Estimated: >60 mL/min (ref >60)
Glucose, Bld: 182 mg/dL — ABNORMAL HIGH (ref 70–99)
Potassium: 3.5 mmol/L (ref 3.5–5.1)
Sodium: 137 mmol/L (ref 135–145)
Total Bilirubin: 0.8 mg/dL (ref 0.0–1.2)
Total Protein: 6.3 g/dL — ABNORMAL LOW (ref 6.5–8.1)

## 2024-07-12 LAB — HEMOGLOBIN A1C
Hgb A1c MFr Bld: 5.5 % (ref 4.8–5.6)
Mean Plasma Glucose: 111.15 mg/dL

## 2024-07-12 LAB — GLUCOSE, CAPILLARY
Glucose-Capillary: 179 mg/dL — ABNORMAL HIGH (ref 70–99)
Glucose-Capillary: 212 mg/dL — ABNORMAL HIGH (ref 70–99)
Glucose-Capillary: 158 mg/dL — ABNORMAL HIGH (ref 70–99)

## 2024-07-12 LAB — CBC
HCT: 28.2 % — ABNORMAL LOW (ref 36.0–46.0)
Hemoglobin: 8.7 g/dL — ABNORMAL LOW (ref 12.0–15.0)
MCH: 30.9 pg (ref 26.0–34.0)
MCHC: 30.9 g/dL (ref 30.0–36.0)
MCV: 100 fL (ref 80.0–100.0)
Platelets: 383 K/uL (ref 150–400)
RBC: 2.82 MIL/uL — ABNORMAL LOW (ref 3.87–5.11)
RDW: 25.5 % — ABNORMAL HIGH (ref 11.5–15.5)
WBC: 10.5 K/uL (ref 4.0–10.5)
nRBC: 0.5 % — ABNORMAL HIGH (ref 0.0–0.2)

## 2024-07-12 LAB — MRSA NEXT GEN BY PCR, NASAL: MRSA by PCR Next Gen: NOT DETECTED

## 2024-07-12 LAB — LACTIC ACID, PLASMA
Lactic Acid, Venous: 2.5 mmol/L (ref 0.5–1.9)
Lactic Acid, Venous: 1.3 mmol/L (ref 0.5–1.9)

## 2024-07-12 LAB — MAGNESIUM: Magnesium: 1.9 mg/dL (ref 1.7–2.4)

## 2024-07-12 LAB — TROPONIN I (HIGH SENSITIVITY): Troponin I (High Sensitivity): 68 ng/L — ABNORMAL HIGH (ref <18)

## 2024-07-12 LAB — PHOSPHORUS: Phosphorus: 3.6 mg/dL (ref 2.5–4.6)

## 2024-07-12 MED ORDER — FENTANYL CITRATE (PF) 50 MCG/ML IJ SOSY
PREFILLED_SYRINGE | INTRAMUSCULAR | Status: AC
  Filled 2024-07-12: qty 2

## 2024-07-12 MED ORDER — AMIODARONE IV BOLUS ONLY 150 MG/100ML
INTRAVENOUS | Status: AC
  Filled 2024-07-12: qty 100

## 2024-07-12 MED ORDER — SUCCINYLCHOLINE CHLORIDE 200 MG/10ML IV SOSY
PREFILLED_SYRINGE | INTRAVENOUS | Status: AC
  Filled 2024-07-12: qty 10

## 2024-07-12 MED ORDER — ACETAMINOPHEN 160 MG/5ML PO SOLN: 650.0000 mg | Freq: Four times a day (QID) | ORAL | Status: DC | PRN

## 2024-07-12 MED ORDER — NOREPINEPHRINE 4 MG/250ML-% IV SOLN
0.0000 ug/min | INTRAVENOUS | Status: DC
  Administered 2024-07-12: 5 ug/min via INTRAVENOUS

## 2024-07-12 MED ORDER — MIDAZOLAM HCL (PF) 2 MG/2ML IJ SOLN
2.0000 mg | Freq: Once | INTRAMUSCULAR | Status: AC
  Administered 2024-07-12: 2 mg via INTRAVENOUS

## 2024-07-12 MED ORDER — FENTANYL CITRATE (PF) 50 MCG/ML IJ SOSY
50.0000 ug | PREFILLED_SYRINGE | Freq: Once | INTRAMUSCULAR | Status: AC
  Administered 2024-07-12: 50 ug via INTRAVENOUS

## 2024-07-12 MED ORDER — PANTOPRAZOLE SODIUM 40 MG IV SOLR
40.0000 mg | Freq: Two times a day (BID) | INTRAVENOUS | Status: DC
  Administered 2024-07-12 – 2024-07-20 (×16): 40 mg via INTRAVENOUS
  Filled 2024-07-12 (×17): qty 10

## 2024-07-12 MED ORDER — METHOCARBAMOL 500 MG PO TABS
500.0000 mg | ORAL_TABLET | Freq: Four times a day (QID) | ORAL | Status: DC | PRN
  Administered 2024-07-14 – 2024-07-17 (×2): 500 mg
  Filled 2024-07-12 (×2): qty 1

## 2024-07-12 MED ORDER — LACTULOSE 10 GM/15ML PO SOLN
30.0000 g | Freq: Three times a day (TID) | ORAL | Status: DC
  Administered 2024-07-12 – 2024-07-13 (×2): 30 g
  Filled 2024-07-12 (×2): qty 45

## 2024-07-12 MED ORDER — FENTANYL CITRATE (PF) 50 MCG/ML IJ SOSY: 50.0000 ug | PREFILLED_SYRINGE | INTRAMUSCULAR | Status: DC | PRN

## 2024-07-12 MED ORDER — NEOSTIGMINE METHYLSULFATE 10 MG/10ML IV SOLN
0.2500 mg | Freq: Four times a day (QID) | INTRAVENOUS | Status: DC
  Administered 2024-07-12 – 2024-07-15 (×12): 0.25 mg via SUBCUTANEOUS
  Filled 2024-07-12 (×14): qty 0.25

## 2024-07-12 MED ORDER — PHENYLEPHRINE 80 MCG/ML (10ML) SYRINGE FOR IV PUSH (FOR BLOOD PRESSURE SUPPORT): 560.0000 ug | PREFILLED_SYRINGE | Freq: Once | INTRAVENOUS | Status: DC | PRN

## 2024-07-12 MED ORDER — ACETAMINOPHEN 160 MG/5ML PO SOLN
650.0000 mg | Freq: Four times a day (QID) | ORAL | Status: DC | PRN
  Administered 2024-07-14 – 2024-07-20 (×5): 650 mg
  Filled 2024-07-12 (×4): qty 20.3

## 2024-07-12 MED ORDER — PIPERACILLIN-TAZOBACTAM 3.375 G IVPB
3.3750 g | Freq: Three times a day (TID) | INTRAVENOUS | Status: DC
  Administered 2024-07-12 – 2024-07-17 (×15): 3.375 g via INTRAVENOUS
  Filled 2024-07-12 (×13): qty 50

## 2024-07-12 MED ORDER — LACTATED RINGERS IV BOLUS
1000.0000 mL | Freq: Once | INTRAVENOUS | Status: AC
  Administered 2024-07-12: 1000 mL via INTRAVENOUS

## 2024-07-12 MED ORDER — AMIODARONE HCL IN DEXTROSE 360-4.14 MG/200ML-% IV SOLN
30.0000 mg/h | INTRAVENOUS | Status: DC
  Administered 2024-07-12 – 2024-07-15 (×7): 30 mg/h via INTRAVENOUS
  Filled 2024-07-12 (×6): qty 200

## 2024-07-12 MED ORDER — ASPIRIN 81 MG PO CHEW
81.0000 mg | CHEWABLE_TABLET | Freq: Every day | ORAL | Status: DC
  Administered 2024-07-13 – 2024-07-20 (×8): 81 mg
  Filled 2024-07-12 (×8): qty 1

## 2024-07-12 MED ORDER — LORAZEPAM 2 MG/ML IJ SOLN
0.5000 mg | Freq: Four times a day (QID) | INTRAMUSCULAR | Status: DC | PRN
  Administered 2024-07-12: 0.5 mg via INTRAVENOUS
  Filled 2024-07-12: qty 1

## 2024-07-12 MED ORDER — DOCUSATE SODIUM 50 MG/5ML PO LIQD
100.0000 mg | Freq: Two times a day (BID) | ORAL | Status: DC
  Administered 2024-07-12 – 2024-07-20 (×13): 100 mg
  Filled 2024-07-12 (×15): qty 10

## 2024-07-12 MED ORDER — NOREPINEPHRINE 4 MG/250ML-% IV SOLN
INTRAVENOUS | Status: AC
  Filled 2024-07-12: qty 250

## 2024-07-12 MED ORDER — IOHEXOL 9 MG/ML PO SOLN: 500.0000 mL | ORAL | Status: DC

## 2024-07-12 MED ORDER — MELATONIN 3 MG PO TABS: 3.0000 mg | ORAL_TABLET | Freq: Every evening | ORAL | Status: DC | PRN

## 2024-07-12 MED ORDER — METHOCARBAMOL 1000 MG/10ML IJ SOLN: 500.0000 mg | Freq: Four times a day (QID) | INTRAMUSCULAR | Status: DC | PRN

## 2024-07-12 MED ORDER — ADULT MULTIVITAMIN W/MINERALS CH
1.0000 | ORAL_TABLET | Freq: Every day | ORAL | Status: DC
  Administered 2024-07-13 – 2024-07-20 (×8): 1
  Filled 2024-07-12 (×8): qty 1

## 2024-07-12 MED ORDER — PHENYLEPHRINE 80 MCG/ML (10ML) SYRINGE FOR IV PUSH (FOR BLOOD PRESSURE SUPPORT)
PREFILLED_SYRINGE | INTRAVENOUS | Status: AC
  Filled 2024-07-12: qty 10

## 2024-07-12 MED ORDER — PROCHLORPERAZINE EDISYLATE 10 MG/2ML IJ SOLN
10.0000 mg | Freq: Four times a day (QID) | INTRAMUSCULAR | Status: DC | PRN
  Administered 2024-07-12: 10 mg via INTRAVENOUS
  Filled 2024-07-12: qty 2

## 2024-07-12 MED ORDER — MIDAZOLAM HCL 2 MG/2ML IJ SOLN
INTRAMUSCULAR | Status: AC
  Filled 2024-07-12: qty 2

## 2024-07-12 MED ORDER — ETOMIDATE 2 MG/ML IV SOLN
20.0000 mg | Freq: Once | INTRAVENOUS | Status: AC
  Administered 2024-07-12: 20 mg via INTRAVENOUS

## 2024-07-12 MED ORDER — ACETAMINOPHEN 160 MG/5ML PO SOLN
1000.0000 mg | Freq: Three times a day (TID) | ORAL | Status: DC
  Administered 2024-07-12: 1000 mg via ORAL
  Filled 2024-07-12 (×3): qty 40.6

## 2024-07-12 MED ORDER — ETOMIDATE 2 MG/ML IV SOLN
INTRAVENOUS | Status: AC
  Filled 2024-07-12: qty 20

## 2024-07-12 MED ORDER — ONDANSETRON HCL 4 MG/2ML IJ SOLN: 4.0000 mg | Freq: Four times a day (QID) | INTRAMUSCULAR | Status: DC | PRN

## 2024-07-12 MED ORDER — SIMETHICONE 80 MG PO CHEW: 80.0000 mg | CHEWABLE_TABLET | Freq: Four times a day (QID) | ORAL | Status: DC | PRN

## 2024-07-12 MED ORDER — AMIODARONE HCL IN DEXTROSE 360-4.14 MG/200ML-% IV SOLN
60.0000 mg/h | INTRAVENOUS | Status: AC
  Administered 2024-07-12: 60 mg/h via INTRAVENOUS
  Filled 2024-07-12: qty 200

## 2024-07-12 MED ORDER — ENSURE PLUS HIGH PROTEIN PO LIQD: 237.0000 mL | Freq: Two times a day (BID) | ORAL | Status: DC

## 2024-07-12 MED ORDER — ALLOPURINOL 300 MG PO TABS
300.0000 mg | ORAL_TABLET | Freq: Every evening | ORAL | Status: DC
  Administered 2024-07-13 – 2024-07-19 (×7): 300 mg
  Filled 2024-07-12 (×8): qty 1

## 2024-07-12 MED ORDER — ROCURONIUM BROMIDE 10 MG/ML (PF) SYRINGE
60.0000 mg | PREFILLED_SYRINGE | Freq: Once | INTRAVENOUS | Status: AC
  Administered 2024-07-12: 60 mg via INTRAVENOUS

## 2024-07-12 MED ORDER — POLYETHYLENE GLYCOL 3350 17 G PO PACK
17.0000 g | PACK | Freq: Every day | ORAL | Status: DC
  Administered 2024-07-12 – 2024-07-13 (×2): 17 g
  Filled 2024-07-12 (×2): qty 1

## 2024-07-12 MED ORDER — ACETAMINOPHEN 160 MG/5ML PO SOLN
1000.0000 mg | Freq: Three times a day (TID) | ORAL | Status: AC
  Administered 2024-07-13 – 2024-07-14 (×4): 1000 mg
  Filled 2024-07-12 (×5): qty 40.6

## 2024-07-12 MED ORDER — PROPOFOL 1000 MG/100ML IV EMUL
0.0000 ug/kg/min | INTRAVENOUS | Status: DC
  Administered 2024-07-12: 10 ug/kg/min via INTRAVENOUS
  Administered 2024-07-13: 40 ug/kg/min via INTRAVENOUS
  Administered 2024-07-13: 30 ug/kg/min via INTRAVENOUS
  Administered 2024-07-13: 22 ug/kg/min via INTRAVENOUS
  Administered 2024-07-13: 40 ug/kg/min via INTRAVENOUS
  Administered 2024-07-14 (×2): 45 ug/kg/min via INTRAVENOUS
  Administered 2024-07-14 – 2024-07-15 (×3): 20 ug/kg/min via INTRAVENOUS
  Administered 2024-07-16: 15 ug/kg/min via INTRAVENOUS
  Administered 2024-07-16: 20 ug/kg/min via INTRAVENOUS
  Administered 2024-07-17 (×2): 30 ug/kg/min via INTRAVENOUS
  Administered 2024-07-17: 25 ug/kg/min via INTRAVENOUS
  Administered 2024-07-18 (×2): 30 ug/kg/min via INTRAVENOUS
  Administered 2024-07-19: 45 ug/kg/min via INTRAVENOUS
  Administered 2024-07-19: 25 ug/kg/min via INTRAVENOUS
  Filled 2024-07-12 (×11): qty 100
  Filled 2024-07-12: qty 200
  Filled 2024-07-12 (×7): qty 100

## 2024-07-12 MED ORDER — ORAL CARE MOUTH RINSE
15.0000 mL | OROMUCOSAL | Status: DC | PRN
Start: 2024-07-12 — End: 2024-07-19

## 2024-07-12 MED ORDER — TRAVASOL 10 % IV SOLN
INTRAVENOUS | Status: AC
  Filled 2024-07-12: qty 811.2

## 2024-07-12 MED ORDER — ROCURONIUM BROMIDE 10 MG/ML (PF) SYRINGE
PREFILLED_SYRINGE | INTRAVENOUS | Status: AC
  Filled 2024-07-12: qty 10

## 2024-07-12 MED ORDER — FENTANYL CITRATE (PF) 50 MCG/ML IJ SOSY
50.0000 ug | PREFILLED_SYRINGE | INTRAMUSCULAR | Status: DC | PRN
  Administered 2024-07-13: 50 ug via INTRAVENOUS
  Filled 2024-07-12: qty 1

## 2024-07-12 MED ORDER — ORAL CARE MOUTH RINSE
15.0000 mL | OROMUCOSAL | Status: DC
  Administered 2024-07-12 – 2024-07-19 (×79): 15 mL via OROMUCOSAL

## 2024-07-12 MED ORDER — OXYCODONE HCL 5 MG/5ML PO SOLN
10.0000 mg | ORAL | Status: DC | PRN
  Administered 2024-07-14 – 2024-07-16 (×5): 10 mg
  Filled 2024-07-12 (×5): qty 10

## 2024-07-12 MED ORDER — INSULIN ASPART 100 UNIT/ML IJ SOLN
0.0000 [IU] | INTRAMUSCULAR | Status: DC
  Administered 2024-07-12: 2 [IU] via SUBCUTANEOUS
  Administered 2024-07-12: 3 [IU] via SUBCUTANEOUS
  Administered 2024-07-13: 1 [IU] via SUBCUTANEOUS
  Administered 2024-07-13: 2 [IU] via SUBCUTANEOUS
  Administered 2024-07-13 (×3): 1 [IU] via SUBCUTANEOUS
  Administered 2024-07-13: 2 [IU] via SUBCUTANEOUS
  Administered 2024-07-14 – 2024-07-15 (×8): 1 [IU] via SUBCUTANEOUS
  Administered 2024-07-15 (×2): 2 [IU] via SUBCUTANEOUS
  Administered 2024-07-15 – 2024-07-16 (×5): 1 [IU] via SUBCUTANEOUS
  Administered 2024-07-16: 2 [IU] via SUBCUTANEOUS
  Administered 2024-07-17: 1 [IU] via SUBCUTANEOUS
  Administered 2024-07-17: 2 [IU] via SUBCUTANEOUS
  Administered 2024-07-17 – 2024-07-18 (×4): 1 [IU] via SUBCUTANEOUS
  Administered 2024-07-18: 2 [IU] via SUBCUTANEOUS
  Administered 2024-07-18: 1 [IU] via SUBCUTANEOUS
  Administered 2024-07-18: 2 [IU] via SUBCUTANEOUS
  Administered 2024-07-18 (×2): 1 [IU] via SUBCUTANEOUS
  Administered 2024-07-19 (×5): 2 [IU] via SUBCUTANEOUS
  Administered 2024-07-19: 1 [IU] via SUBCUTANEOUS
  Administered 2024-07-19 – 2024-07-20 (×2): 2 [IU] via SUBCUTANEOUS
  Administered 2024-07-20 (×3): 1 [IU] via SUBCUTANEOUS

## 2024-07-12 NOTE — Progress Notes (Signed)
 [facility] Progress Note  SUBJECTIVE:   Interval history: Marie Johnson was seen and evaluated today at bedside. Patient resting in chair. Noted that she had large bowel movement last evening. Her abdomen appears more distended this AM. She had some nausea this AM after taking Tylenol , though no emesis. Abdominal discomfort from distention. She has increased work of breathing, feels short of breath. No chest pain.   Past Medical History:  Diagnosis Date   AAA (abdominal aortic aneurysm)    3.4 cm   Anemia    Anxiety    Aortic atherosclerosis    Arthritis    Asthma    CAD (coronary artery disease)    a. Nonobstructive CAD by cath 07/17/13.   Cancer [facility])    Carotid artery disease    Carotid US  8/22: Bilateral ICA 1-39; right subclavian stenosis; right thyroid  nodule   CHD (congenital heart disease)    pt unaware???   Chest pain    a. Adm 10-07/2013 - CTA neg for PE/dissection, cath with nonobstructive disease, CP ?due to uncontrolled HTN vs coronary vasospasm.   CHF (congestive heart failure) (HCC)    Closed head injury with concussion    COPD (chronic obstructive pulmonary disease) (HCC)    Coronary artery disease    Echocardiogram    Echocardiogram 06/2019: EF 60-65, impaired relaxation (Gr 1 DD), mild MAC, mild MR, mild to mod aortic valve sclerosis (no AS), trivial TR, mild LAE, normal RVSF, RVSP 30.7 (mildly elevated)   GERD (gastroesophageal reflux disease)    History of blood transfusion    childbirth, Hysterectomy   Hyperlipidemia    Hypertension    Neuropathy    OA (osteoarthritis)    Osteoporosis    PAD (peripheral artery disease)    a. s/p L fem-pop bypass b. recent evaluation at [facility], unamenable to intervention   PAD (peripheral artery disease)    Palpitations 02/22/2016   Pneumonia    Pre-diabetes    Premature ventricular contraction    PUD (peptic ulcer disease)    PVD (peripheral vascular disease) 07/17/2013   Thoracic aortic aneurysm     Chest/aorta CTA 04/2022: 14 x 8 focal saccular aneurysm versus penetrating atherosclerotic ulcer involving left side of proximal portion of transverse aortic arch; descending thoracic aorta and proximal abdominal aorta aneurysm 4.4 cm; aortic atherosclerosis; emphysema   Tobacco abuse    Wears dentures    Wears glasses    Past Surgical History:  Procedure Laterality Date   ABDOMINAL AORTOGRAM W/LOWER EXTREMITY N/A 03/09/2020   Procedure: ABDOMINAL AORTOGRAM W/LOWER EXTREMITY;  Surgeon: Gretta Lonni PARAS, MD;  Location: [facility] INVASIVE CV LAB;  Service: Cardiovascular;  Laterality: N/A;   ABDOMINAL AORTOGRAM W/LOWER EXTREMITY N/A 07/26/2022   Procedure: ABDOMINAL AORTOGRAM W/LOWER EXTREMITY;  Surgeon: Gretta Lonni PARAS, MD;  Location: [facility] CV [facility];  Service: Cardiovascular;  Laterality: N/A;   ABDOMINAL AORTOGRAM W/LOWER EXTREMITY N/A 09/20/2022   Procedure: ABDOMINAL AORTOGRAM W/LOWER EXTREMITY;  Surgeon: Gretta Lonni PARAS, MD;  Location: [facility]VAS[facility] CV LAB;  Service: Cardiovascular;  Laterality: N/A;   ABDOMINAL HYSTERECTOMY     BREAST SURGERY     left cyst excision x 2   CARDIAC CATHETERIZATION     several years ago, nonobstructive, 50-60% blockages   DIRECT LARYNGOSCOPY Bilateral 03/13/2023   Procedure: MICRO DIRECT LARYNGOSCOPY WITH BIOPSY;  Surgeon: Carlie Clark, MD;  Location: [facility] OR;  Service: ENT;  Laterality: Bilateral;   ENDARTERECTOMY FEMORAL Right 03/18/2020   Procedure: ENDARTERECTOMY FEMORAL WITH PROFUNDAPLASTY;  Surgeon:  Gretta Lonni PARAS, MD;  Location: [facility] OR;  Service: Vascular;  Laterality: Right;   FEMORAL-POPLITEAL BYPASS GRAFT Right 03/18/2020   Procedure: BYPASS GRAFT FEMORAL-POPLITEAL ARTERY using GORE PROPATEN VASCULAR GRAFT;  Surgeon: Gretta Lonni PARAS, MD;  Location: [facility] OR;  Service: Vascular;  Laterality: Right;   HIP SURGERY     left - bars and screws placed   HIP SURGERY     INSERTION OF ILIAC STENT Right 03/18/2020   Procedure: INSERTION OF GORE  VIABAHN  ILIAC STENT;  Surgeon: Gretta Lonni PARAS, MD;  Location: [facility] OR;  Service: Vascular;  Laterality: Right;   LEFT HEART CATH AND CORONARY ANGIOGRAPHY N/A 04/08/2023   Procedure: LEFT HEART CATH AND CORONARY ANGIOGRAPHY;  Surgeon: Anner Alm ORN, MD;  Location: [facility] INVASIVE CV LAB;  Service: Cardiovascular;  Laterality: N/A;   LEFT HEART CATHETERIZATION WITH CORONARY ANGIOGRAM N/A 07/17/2013   Procedure: LEFT HEART CATHETERIZATION WITH CORONARY ANGIOGRAM;  Surgeon: Ezra GORMAN Shuck, MD;  Location: [facility] CATH LAB;  Service: Cardiovascular;  Laterality: N/A;   LOWER EXTREMITY ANGIOGRAM Right 03/18/2020   Procedure: RIGHT ILIAC ARTERIOGRAM WITH AN ILIAC STENT;  Surgeon: Gretta Lonni PARAS, MD;  Location: [facility] OR;  Service: Vascular;  Laterality: Right;   MULTIPLE TOOTH EXTRACTIONS     PATCH ANGIOPLASTY Right 03/18/2020   Procedure: PATCH ANGIOPLASTY using a GEORGE BIOLOGIC PATCH OF THE PROFUNDA;  Surgeon: Gretta Lonni PARAS, MD;  Location: [facility] OR;  Service: Vascular;  Laterality: Right;   PERIPHERAL VASCULAR CATHETERIZATION N/A 07/16/2016   Procedure: Abdominal Aortogram w/Lower Extremity;  Surgeon: Krystal JULIANNA Doing, MD;  Location: [facility] INVASIVE CV LAB;  Service: Cardiovascular;  Laterality: N/A;   PERIPHERAL VASCULAR INTERVENTION Left 03/09/2020   Procedure: PERIPHERAL VASCULAR INTERVENTION;  Surgeon: Gretta Lonni PARAS, MD;  Location: [facility];  Service: Cardiovascular;  Laterality: Left;  common and external illiac    ROTATOR CUFF REPAIR     right side, had torn ligaments as well   TUBAL LIGATION     VEIN BYPASS SURGERY     left leg   WRIST ARTHROCENTESIS     left - broke in 3 places so has bars and screws placed   Current Facility-Administered Medications  Medication Dose Route Frequency Provider Last Rate Last Admin   acetaminophen  (TYLENOL ) 160 MG/5ML solution 1,000 mg  1,000 mg Oral Q8H McClung, Reyes DASEN, MD       Followed by   NOREEN ON 07/14/2024] acetaminophen  (TYLENOL ) 160  MG/5ML solution 650 mg  650 mg Oral Q6H PRN Danton Reyes DASEN, MD       albuterol  (PROVENTIL ) (2.5 MG/3ML) 0.083% nebulizer solution 2.5 mg  2.5 mg Nebulization Q2H PRN Ezenduka, Nkeiruka J, MD   2.5 mg at 07/12/24 0553   allopurinol  (ZYLOPRIM ) tablet 300 mg  300 mg Oral QPM Ezenduka, Nkeiruka J, MD   300 mg at 07/11/24 1605   arformoterol (BROVANA) nebulizer solution 15 mcg  15 mcg Nebulization BID Perri DELENA Meliton Mickey., MD   15 mcg at 07/12/24 0858   aspirin  EC tablet 81 mg  81 mg Oral q AM Perri DELENA Meliton Mickey., MD   81 mg at 07/12/24 9157   bisacodyl  (DULCOLAX) suppository 10 mg  10 mg Rectal BID Karki, Arya, MD   10 mg at 07/09/24 9051   budesonide  (PULMICORT ) nebulizer solution 0.25 mg  0.25 mg Nebulization BID Perri DELENA Meliton Mickey., MD   0.25 mg at 07/12/24 0859   Chlorhexidine  Gluconate Cloth 2 % PADS 6 each  6 each Topical Daily Danton Reyes DASEN, MD   6 each at 07/11/24 9065   enoxaparin  (LOVENOX ) injection 40 mg  40 mg Subcutaneous Daily Perri DELENA Meliton Mickey., MD   40 mg at 07/11/24 0933   feeding supplement (ENSURE PLUS HIGH PROTEIN) liquid 237 mL  237 mL Oral BID BM Danton Reyes DASEN, MD   237 mL at 07/12/24 0853   HYDROmorphone  (DILAUDID ) injection 0.5 mg  0.5 mg Intravenous Q4H PRN Danton Reyes T, MD   0.5 mg at 07/12/24 0557   ipratropium-albuterol  (DUONEB) 0.5-2.5 (3) MG/3ML nebulizer solution 3 mL  3 mL Nebulization BID Danton Reyes T, MD   3 mL at 07/12/24 0859   lactulose  (CHRONULAC ) 10 GM/15ML solution 30 g  30 g Oral TID Karki, Arya, MD   30 g at 07/11/24 2157   lidocaine  (LIDODERM ) 5 % 2 patch  2 patch Transdermal Q24H Perri DELENA Meliton Mickey., MD   2 patch at 07/09/24 1212   melatonin tablet 3 mg  3 mg Oral QHS PRN Doutova, Anastassia, MD   3 mg at 07/11/24 2207   methocarbamol (ROBAXIN) tablet 500 mg  500 mg Oral Q6H PRN Doutova, Anastassia, MD   500 mg at 07/11/24 2207   Or   methocarbamol (ROBAXIN) injection 500 mg  500 mg Intravenous Q6H PRN Doutova,  Anastassia, MD   500 mg at 07/12/24 9157   multivitamin with minerals tablet 1 tablet  1 tablet Oral Daily Chen, Lydia D, RPH       oxyCODONE  (ROXICODONE ) 5 MG/5ML solution 10-15 mg  10-15 mg Oral Q4H PRN Danton Reyes DASEN, MD   15 mg at 07/12/24 0851   pantoprazole  (PROTONIX ) EC tablet 40 mg  40 mg Oral BID Danton Reyes DASEN, MD   40 mg at 07/11/24 0942   phenol (CHLORASEPTIC) mouth spray 1 spray  1 spray Mouth/Throat PRN Perri DELENA Meliton Mickey., MD   1 spray at 07/07/24 2002   polyethylene glycol (MIRALAX  / GLYCOLAX ) packet 17 g  17 g Oral BID Perri DELENA Meliton Mickey., MD   17 g at 07/11/24 2157   simethicone (MYLICON) chewable tablet 80 mg  80 mg Oral Q6H PRN Chavez, Abigail, NP   80 mg at 07/06/24 2117   sodium chloride  flush (NS) 0.9 % injection 10-40 mL  10-40 mL Intracatheter Q12H Danton Reyes DASEN, MD   10 mL at 07/11/24 2159   sodium chloride  flush (NS) 0.9 % injection 10-40 mL  10-40 mL Intracatheter PRN Danton Reyes DASEN, MD       TPN ADULT (ION)   Intravenous Continuous TPN Chen, Lydia D, RPH 65 mL/hr at 07/11/24 1755 New Bag at 07/11/24 1755   TPN ADULT (ION)   Intravenous Continuous TPN Chen, Lydia D, [facility]       umeclidinium bromide  (INCRUSE ELLIPTA ) 62.5 MCG/ACT 1 puff  1 puff Inhalation Daily Reome, Earle J, RPH   1 puff at 07/12/24 0854   Facility-Administered Medications Ordered in Other Encounters  Medication Dose Route Frequency Provider Last Rate Last Admin   DOBUTamine  (DOBUTREX ) 1,000 mcg/mL in dextrose  5% 250 mL infusion  30 mcg/kg/min Intravenous Titrated Maranda Leim DEL, MD       Allergies as of 06/29/2024 - Review Complete 06/29/2024  Allergen Reaction Noted   Bee venom Anaphylaxis and Other (See Comments) 07/17/2013   Insect extract Anaphylaxis and Other (See Comments) 03/08/2020   Farxiga  [dapagliflozin ] Nausea And Vomiting 06/11/2022   Lyrica [pregabalin] Nausea And Vomiting 06/11/2022  Neurontin [gabapentin] Nausea And Vomiting and Other (See Comments)  11/09/2021   Nsaids Nausea Only and Other (See Comments) 03/08/2020   Latex Rash and Other (See Comments) 07/12/2016   Review of Systems:  Review of Systems  Respiratory:  Positive for shortness of breath.   Cardiovascular:  Negative for chest pain.  Gastrointestinal:  Positive for abdominal pain and nausea.    OBJECTIVE:   Temp:  [97.5 F (36.4 C)-97.9 F (36.6 C)] 97.9 F (36.6 C) (10/26 0748) Pulse Rate:  [65-98] 88 (10/26 0748) Resp:  [19-24] 19 (10/26 0748) BP: (106-152)/(65-97) 121/82 (10/26 0748) SpO2:  [94 %-100 %] 94 % (10/26 0748) FiO2 (%):  [36 %] 36 % (10/25 2105) Last BM Date : 07/11/24 Physical Exam Constitutional:      General: She is in acute distress.     Appearance: She is ill-appearing. She is not toxic-appearing or diaphoretic.  Cardiovascular:     Rate and Rhythm: Tachycardia present. Rhythm irregular.  Pulmonary:     Effort: Respiratory distress present.     Breath sounds: Wheezing present.  Abdominal:     General: There is distension.     Tenderness: There is abdominal tenderness.     Comments: High pitched bowel sounds   Neurological:     Mental Status: She is alert.     Labs: Recent Labs    07/12/24 0609  WBC 10.5  HGB 8.7*  HCT 28.2*  PLT 383   BMET Recent Labs    07/10/24 0355 07/11/24 0850 07/12/24 0609  NA 139 134* 137  K 3.6 4.0 3.5  CL 103 97* 100  CO2 29 28 27   GLUCOSE 151* 150* 182*  BUN 14 18 20   CREATININE 0.65 0.58 0.51  CALCIUM  8.1* 8.2* 8.4*   LFT Recent Labs    07/12/24 0609  PROT 6.3*  ALBUMIN  2.4*  AST 19  ALT 12  ALKPHOS 74  BILITOT 0.8   PT/INR No results for input(s): LABPROT, INR in the last 72 hours. Diagnostic imaging: No results found.  IMPRESSION: Colonic ileus             -NG tube removed 07/10/24             -Last BM 07/12/24 0530 medium/brown [place] 7 Protein calorie malnutrition, on TPN Right hip fracture, narcotics for analgesia History of chronic idiopathic  constipation Chronic obstructive pulmonary disease, on 4 liters supplemental oxygen   PLAN: -Despite having bowel movements, still with abdominal distention, recommend update CT imaging of abdomen/pelvis (specifically query/update short segment narrowing without discrete mass lesion in sigmoid region on CT imaging 07/03/24) -Communicated concerns re breathing and tachycardia with IM -Change diet to clear liquids pending CT imaging -Dr. Rosalie to follow up on patient tomorrow for [facility]   LOS: 13 days   Estefana Keas, [facility]

## 2024-07-12 NOTE — Progress Notes (Signed)
 PHARMACY - TOTAL PARENTERAL NUTRITION CONSULT NOTE   Indication: Prolonged ileus  Patient Measurements: Height: 5' 7.5 (171.5 cm) Weight: 66.7 kg (147 lb 0.8 oz) IBW/kg (Calculated) : 62.75 TPN AdjBW (KG): 61.2 Body mass index is 22.69 kg/m. Usual Weight: 140lbs per pt, 135 lbs per chart review   Assessment:  73 yo W admitted with mechanical fall and right femur fracture 10/13 and then found to have ileus on KUB 10/16. Pt was switched to clear liquid diet 10/17-10/20 but vomited what she tried drink. Patient has been NPO since so has had no intake for 7 days. She had N/V and required NG tube placement 10/20 PM with brown particulate output and improvement in N/V. The last meal patient remembers eating is a ham and beef sandwich on 10/13. She normally eats 2-3 meals a day - oatmeal or grits for breakfast (doesn't like eggs), sandwiches and meals from meals on wheels. She is not a big eater but is very hungry as of 10/23 and has liked vanilla ensures in the past. She has been on D5LR since 10/20 PM, receiving 90g dextrose /24hr, which will help decrease refeeding risk. Pharmacy consulted for TPN.   Glucose / Insulin: BG <185, off SSI/24hr; no hx DM Electrolytes: K 3.5 down, CoCa 9.68, others wnl  Renal: Scr 0.51, BUN wnl Hepatic: Alk phos/AST/AL/Tbili wnl, albumin  2.4, TG 68 Intake / Output; MIVF: UOP not charted, LBM 10/23 type 7x2  GI Imaging: 10/16 KUB: Diffuse bowel gas distension throughout the abdomen, possible ileus  10/17 CT: Gaseous distention of the colon without discrete focal transition point or mass lesion likely representing ileus 10/20 KUB: Persistent gaseous dilatation of the large bowel 10/21 KUB: moderate diffuse bowel distension, consistent with ileus 10/24 KUB:  Gaseous distension of large and small bowel consistent with ileus, slightly improved from prior. GI Surgeries / Procedures:  10/24 NGT removed   Central access: PICC 10/23  TPN start date:  10/23  Nutritional Goals: Goal TPN rate is 65 mL/hr (provides 81 g of protein and 1673 kcals per day)  RD Assessment: Estimated Needs Total Energy Estimated Needs: 1530-1836 kcals Total Protein Estimated Needs: 61-91 grams Total Fluid Estimated Needs: 1.5-1.8L/d  Current Nutrition:  TPN 10/23 CLD 10/24 FLD- had beef broth, cranberry juice, coffee, chocolate pudding, apple sauce, no Ensure 10/25 Soft - patient in a lot of pain, told RN she doesn't like Ensure and refused. Only had soup and ice cream 10/26 Soft - patient willing to try Ensure   Plan:  Discussed with MD, continue TPN at goal 65 mL/hr at 1800, provides 100% estimated needs  Electrolytes in TPN: Na 150 mEq/L, increase K 50 mEq/L, Ca 2 mEq/L, increase Mg 10 mEq/L, and Phos 10 mmol/L. Cl:Ac 2:1 PO MVI and trace elements   Monitor TPN labs Mon/Thurs and PRN F/u PO intake, wean TPN as able  Jinnie Door, PharmD, BCPS, BCCP Clinical Pharmacist  Please check AMION for all [facility] Pharmacy phone numbers After 10:00 PM, call Main Pharmacy [phone]

## 2024-07-12 NOTE — Progress Notes (Signed)
 Called to room by RN who stated pt was SOB. Upon arrival, pt was somnolent w/ sats of 100% on NRB mask. BBS decd but essn clear. Another RN at bedside reports pt rec'd ativan  recently. Pt weaned to 6 lpm for follow up sats of 100%. MD at bedside. Will call if further RT intervention is needed.

## 2024-07-12 NOTE — Progress Notes (Signed)
 Marie Johnson  FMW:[id number] DOB: [birth year] DOA: 06/29/2024 PCP: Stephanie Charlene CROME, MD    Brief Narrative:  73 year old with a history of COPD on 2 L nasal cannula home oxygen , CAD, PAD, HTN, HLD, and prior bilateral hip replacements who presented to the ER 10/13 after a mechanical fall with subsequent hip pain ultimately found to be due to an acute periprosthetic right femur fracture.  She has been evaluated by orthopedic surgery who recommended nonoperative management.  Her hospital stay has been complicated by colonic ileus.  Goals of Care:   Code Status: Full Code   DVT prophylaxis: enoxaparin  (LOVENOX ) injection 40 mg Start: 07/06/24 1000 SCDs Start: 06/29/24 2211   Interim Hx: Slow to progress, with poor tolerance of oral intake thus far.  Ongoing abdominal discomfort.  Afebrile.  Vital signs stable.    When I first rounded on the patient this morning she was alert and conversant but uncomfortable.  Her abdomen was significantly more protuberant compared to yesterday and tympanic.  Her respirations were stable at the time but she did report significant abdominal pain.  I placed orders for the patient's NG to be replaced and explained to her the necessity of this.    A few hours later, while rounding on a different unit, I was alerted that the patient was in respiratory distress.  An attempt had been made to place the NG tube by the nurses at the bedside but this failed due to the patient's difficulty following commands related to her worsening respiratory failure.  I returned to the room to see her again and found her in bed essentially obtunded.  Her abdomen was markedly more distended than earlier in the day and she was in clear respiratory distress with increased work of breathing.  She had good air movement throughout all fields but she was also tachycardic and irregular with heart rate of approximately 150 bpm..  I consulted critical care and then spoke with the patient's son via  phone, who she had confirmed to me earlier was the best person for me to speak to.  I explained the situation and we agreed to give her a trial of intubation with mechanical ventilation for stabilization.  The patient's son went on to explain that if she did not improve after a reasonable amount of time in the ICU on the ventilator that he knows she would not wish to undergo this type care for a prolonged period of time.  I agreed with this plan, and communicated this information to the critical care team.  Assessment & Plan:  Acute periprosthetic right femur fracture status post mechanical fall Orthopedics has recommended nonoperative management with WBAT with RW - patient to follow-up with her Orthopedist in [place] after discharge  Severe recurrent colonic ileus Confirmed on KUB 10/16 - CT without evidence of a clear transition point or mass - GI following -initially improved with aggressive bowel regimen, but then recurred to a much more severe extent 10/26 - tolerated clamping of NG beginning 10/23 so NG was discontinued 10/24 -by 10:26 AM the patient's ileus had recurred along with severe abdominal distention - TNA ordered 10/22 and initiated 10/23 -OG placed at time of intubation in ICU 10/26 with significant improvement in abdominal distention afterwards  Acute respiratory distress on chronic hypoxic respiratory failure - COPD On 2 L nasal cannula oxygen  support at home -with return of severe colonic ileus with abdominal distention she developed acute respiratory distress the afternoon of 10/26 with markedly increased work  of breathing and obtundation -she was transferred to the ICU and intubated -oxygenation is stable postintubation  Possible aspiration pneumonitis Follow-up CXR 10/21 noted patchy densities in the right lung with a small to moderate right size effusion and a small left effusion - in setting of ileus it is suspected that she has suffered an aspiration event  -possible right  lung base organizing pneumonia noted on CT scan - follow-up as outpatient -had completed an initial course of empiric antibiotic 10/25 -broad-spectrum antibiotic empirically started on date of intubation 10/26  Newly diagnosed atrial fibrillation with RVR Developed during episode of acute respiratory distress -amiodarone being utilized for rate control -hopefully will spontaneously resolve with stabilization of respiratory status  Anemia of chronic disease - B12 deficiency Baseline hemoglobin approximately 10-11 -no evidence of significant bleeding -B12 modestly low at 371 -hemoglobin stable  Hypokalemia Corrected with supplementation  Hypomagnesemia Corrected with supplementation  History of CAD, PAD, and HLD Status post [facility] July 2024 with mild to moderate nonobstructive disease -was previously on DAPT for PAD -continue aspirin  and Lipitor  -Plavix  reportedly stopped 2 weeks ago   Chronic diastolic CHF Euvolemic at present -TTE noted EF 60-65% with no WMA  HTN Blood pressure controlled at present  Gout Continue usual allopurinol   Nutrition Problem: Moderate Malnutrition Etiology: chronic illness Signs/Symptoms: energy intake < 75% for > or equal to 1 month, mild fat depletion, moderate muscle depletion Interventions: Ensure Enlive (each supplement provides 350kcal and 20 grams of protein), MVI   Family Communication: I spoke with the patient's son via telephone 2 times, first before her intubation, and then after her intubation and stabilization in the ICU Disposition:   Skilled Nursing-Short Term Rehab (<3 Hours/Day)07/10/2024 1157  Objective: Blood pressure 121/82, pulse 88, temperature 97.9 F (36.6 C), resp. rate 19, height 5' 7.5 (1.715 m), weight 66.7 kg, SpO2 94%.  Intake/Output Summary (Last 24 hours) at 07/12/2024 0907 Last data filed at 07/11/2024 1950 Gross per 24 hour  Intake 540 ml  Output --  Net 540 ml   Filed Weights   06/30/24 1100 07/03/24 2004  07/10/24 0500  Weight: 61.2 kg 61.2 kg 66.7 kg    Examination: General: Resting comfortably on the vent at the time of my follow-up exam Lungs: Good breath sounds bilaterally on ventilator Cardiovascular: Irregularly irregular with heart rate approximately 110 Abdomen: Less distended at time of follow-up exam with OG in place but still tympanic, no mass Extremities: No significant edema bilateral lower extremities  CBC: Recent Labs  Lab 07/06/24 0613 07/06/24 1300 07/07/24 0445 07/08/24 0531 07/09/24 0533 07/12/24 0609  WBC 10.3  --  8.5 8.9 8.1 10.5  NEUTROABS 8.7*  --  7.1 7.1  --   --   HGB 8.4*   < > 8.5* 8.8* 8.6* 8.7*  HCT 26.0*   < > 26.8* 28.4* 28.0* 28.2*  MCV 98.5  --  99.3 101.4* 100.4* 100.0  PLT 355  --  384 411* 385 383   < > = values in this interval not displayed.   Basic Metabolic Panel: Recent Labs  Lab 07/10/24 0355 07/11/24 0850 07/12/24 0609  NA 139 134* 137  K 3.6 4.0 3.5  CL 103 97* 100  CO2 29 28 27   GLUCOSE 151* 150* 182*  BUN 14 18 20   CREATININE 0.65 0.58 0.51  CALCIUM  8.1* 8.2* 8.4*  MG 2.0 2.1 1.9  PHOS 3.4 3.3 3.6   GFR: Estimated Creatinine Clearance: 60.2 mL/min (by C-G formula based on SCr of  0.51 mg/dL).   Scheduled Meds:  acetaminophen  (TYLENOL ) oral liquid 160 mg/5 mL  1,000 mg Oral Q8H   allopurinol   300 mg Oral QPM   arformoterol  15 mcg Nebulization BID   aspirin  EC  81 mg Oral q AM   bisacodyl   10 mg Rectal BID   budesonide  (PULMICORT ) nebulizer solution  0.25 mg Nebulization BID   Chlorhexidine  Gluconate Cloth  6 each Topical Daily   enoxaparin  (LOVENOX ) injection  40 mg Subcutaneous Daily   feeding supplement  237 mL Oral BID BM   ipratropium-albuterol   3 mL Nebulization BID   lactulose   30 g Oral TID   lidocaine   2 patch Transdermal Q24H   multivitamin with minerals  1 tablet Oral Daily   pantoprazole   40 mg Oral BID   polyethylene glycol  17 g Oral BID   sodium chloride  flush  10-40 mL Intracatheter Q12H    umeclidinium bromide   1 puff Inhalation Daily   Continuous Infusions:  TPN ADULT (ION) 65 mL/hr at 07/11/24 1755   TPN ADULT (ION)       LOS: 13 days   Reyes IVAR Moores, MD [facility] Office  [phone] Pager - Text Page per Tracey  If 7PM-7AM, please contact night-coverage per Amion 07/12/2024, 9:07 AM

## 2024-07-12 NOTE — Plan of Care (Signed)
  Problem: Clinical Measurements: Goal: Will remain free from infection Outcome: Progressing   Problem: Clinical Measurements: Goal: Respiratory complications will improve Outcome: Progressing   Problem: Activity: Goal: Risk for activity intolerance will decrease Outcome: Progressing   Problem: Pain Managment: Goal: General experience of comfort will improve and/or be controlled Outcome: Progressing   Problem: Safety: Goal: Ability to remain free from injury will improve Outcome: Progressing

## 2024-07-12 NOTE — Consult Note (Signed)
 NAME:  Marie Johnson, MRN:  [id number], DOB:  Mar 24, 1951, LOS: 13 ADMISSION DATE:  06/29/2024, CONSULTATION DATE:  07/12/2024 REFERRING MD:  TRH, CHIEF COMPLAINT:   Acute respiratory distress  History of Present Illness:  Marie Johnson is a 73 y.o. female with a past medical history significant for but not limited to CHF, COPD, chronic hypoxic respiratory failure on 2 L nasal cannula at baseline, HTN, HLD, PAD, osteoporosis, and anxiety initially presented to the ED at [facility] 10/13 after suffering mechanical fall resulting in right periprosthetic femur fracture.  Since admission patient has had several complications, see timeline below  Pertinent  Medical History  CHF, COPD, chronic hypoxic respiratory failure on 2 L nasal cannula at baseline, HTN, HLD, PAD, osteoporosis, and anxiety  Significant Hospital Events: Including procedures, antibiotic start and stop dates in addition to other pertinent events   10/13 presented after suffering mechanical fall at home resulting in acute periprosthetic right femur fracture 10/16 abdominal distention noticed, KUB obtained 10/17 colonic ileus confirmed 10/18 GI consulted recommended medical management 10/21 chest x-ray with patchy densities in the right lung concerning for aspiration pneumonia in the setting of ileus 10/23 TPN started via PICC line 10/25 developed worsening respiratory distress with change in mentation transferred to ICU and emergently admitted  Interim History / Subjective:  Now sedated on ventilator  Objective    Blood pressure 121/82, pulse 88, temperature 97.9 F (36.6 C), resp. rate 19, height 5' 7.5 (1.715 m), weight 66.7 kg, SpO2 94%.    FiO2 (%):  [36 %] 36 %   Intake/Output Summary (Last 24 hours) at 07/12/2024 1349 Last data filed at 07/12/2024 1100 Gross per 24 hour  Intake 300 ml  Output 300 ml  Net 0 ml   Filed Weights   06/30/24 1100 07/03/24 2004 07/10/24 0500  Weight: 61.2 kg 61.2 kg 66.7 kg     Examination: General: Acute on chronic ill-appearing deconditioned elderly female now sedated on ventilator HEENT: ETT, MM pink/moist, PERRL,  Neuro: Sedated and paralyzed on ventilator CV: s1s2 regular rate and rhythm, no murmur, rubs, or gallops,  PULM: Diminished bilaterally, for a ventilator after intubation GI: soft, bowel sounds active in all 4 quadrants, non-tender, non-distended Extremities: warm/dry, no edema  Skin: no rashes or lesions   Resolved problem list   Assessment and Plan  Acute on chronic hypoxic respiratory failure utilizes 2 L nasal cannula at baseline History of COPD Aspiration pneumonia -10/25 emergently intubated due to worsening respiratory distress -Received Unasyn from 10 21-10 25 given worsening respiratory failure prompting emergent intubation will broaden antibiotics further to include Zosyn starting 1025 P: Continue ventilator support with lung protective strategies  Wean PEEP and FiO2 for sats greater than 90%. Head of bed elevated 30 degrees. Plateau pressures less than 30 cm H20.  Follow intermittent chest x-ray and ABG.   SAT/SBT as tolerated, mentation preclude extubation  Ensure adequate pulmonary hygiene  Follow cultures  VAP bundle in place  PAD protocol Zosyn empirically  Prolonged ongoing colonic ileus - Developed pretty early on stay given opioid use during acute fracture.  TPN started 10/23 P: Repeat KUB Check lactic acid Start neostigmine Consider surgical intervention if no improvement over the next 24 hours OG to suction Consider possible CT abdomen  New onset A-fib RVR - Developed during acute respiratory distress P: Start amiodarone bolus given hypotension post intubation Continuous telemetry Optimize electrolytes  Acute periprostatic right femur fracture P: Continue supportive care PAD protocol  Anemia of  chronic disease B12 deficiency P: Trend CBC Transfuse per protocol Hemoglobin goal greater than  7  HFpEF History of CAD Hyperlipidemia Essential hypertension PAD -Status post [facility] July 2024 with mild to moderate nonobstructive disease was previously on DAPT for PAD P: Supportive care Goal of euvolemia Optimize electrolytes Hold home medications  Labs   CBC: Recent Labs  Lab 07/06/24 0613 07/06/24 1300 07/07/24 0445 07/08/24 0531 07/09/24 0533 07/12/24 0609  WBC 10.3  --  8.5 8.9 8.1 10.5  NEUTROABS 8.7*  --  7.1 7.1  --   --   HGB 8.4* 8.3* 8.5* 8.8* 8.6* 8.7*  HCT 26.0* 25.2* 26.8* 28.4* 28.0* 28.2*  MCV 98.5  --  99.3 101.4* 100.4* 100.0  PLT 355  --  384 411* 385 383    Basic Metabolic Panel: Recent Labs  Lab 07/08/24 0531 07/09/24 0533 07/10/24 0355 07/11/24 0850 07/12/24 0609  NA 135 141 139 134* 137  K 4.5 4.2 3.6 4.0 3.5  CL 102 103 103 97* 100  CO2 25 29 29 28 27   GLUCOSE 95 76 151* 150* 182*  BUN 15 8 14 18 20   CREATININE 0.56 0.53 0.65 0.58 0.51  CALCIUM  8.2* 8.1* 8.1* 8.2* 8.4*  MG 2.2 1.9 2.0 2.1 1.9  PHOS 2.6 3.0 3.4 3.3 3.6   GFR: Estimated Creatinine Clearance: 60.2 mL/min (by C-G formula based on SCr of 0.51 mg/dL). Recent Labs  Lab 07/07/24 0445 07/08/24 0531 07/09/24 0533 07/12/24 0609  WBC 8.5 8.9 8.1 10.5    Liver Function Tests: Recent Labs  Lab 07/06/24 9386 07/07/24 0445 07/08/24 0531 07/09/24 0533 07/10/24 0355 07/11/24 0850 07/12/24 0609  AST 23 31 25 20   --   --  19  ALT 12 14 13 12   --   --  12  ALKPHOS 55 57 58 52  --   --  74  BILITOT 1.0 1.1 1.0 1.3*  --   --  0.8  PROT 6.5 6.4* 6.0* 6.0*  --   --  6.3*  ALBUMIN  2.6* 2.4* 2.4* 2.2* 2.1* 2.3* 2.4*   No results for input(s): LIPASE, AMYLASE in the last 168 hours. No results for input(s): AMMONIA in the last 168 hours.  ABG    Component Value Date/Time   TCO2 27 09/20/2022 0641     Coagulation Profile: No results for input(s): INR, PROTIME in the last 168 hours.  Cardiac Enzymes: No results for input(s): CKTOTAL, CKMB,  CKMBINDEX, TROPONINI in the last 168 hours.  HbA1C: Hgb A1c MFr Bld  Date/Time Value Ref Range Status  07/17/2013 08:00 PM 6.1 (H) <5.7 % Final    Comment:    (NOTE)                                                                       According to the ADA Clinical Practice Recommendations for 2011, when HbA1c is used as a screening test:  >=6.5%   Diagnostic of Diabetes Mellitus           (if abnormal result is confirmed) 5.7-6.4%   Increased risk of developing Diabetes Mellitus References:Diagnosis and Classification of Diabetes Mellitus,Diabetes Care,2011,34(Suppl 1):S62-S69 and Standards of Medical Care in         Diabetes -  2011,Diabetes Care,2011,34 (Suppl 1):S11-S61.    CBG: Recent Labs  Lab 07/10/24 1633 07/10/24 2338 07/11/24 0619 07/11/24 1146 07/11/24 1726  GLUCAP 143* 153* 147* 152* 124*    Review of Systems:   Unable to assess   Past Medical History:  She,  has a past medical history of AAA (abdominal aortic aneurysm), Anemia, Anxiety, Aortic atherosclerosis, Arthritis, Asthma, CAD (coronary artery disease), Cancer (HCC), Carotid artery disease, CHD (congenital heart disease), Chest pain, CHF (congestive heart failure) (HCC), Closed head injury with concussion, COPD (chronic obstructive pulmonary disease) (HCC), Coronary artery disease, Echocardiogram, GERD (gastroesophageal reflux disease), History of blood transfusion, Hyperlipidemia, Hypertension, Neuropathy, OA (osteoarthritis), Osteoporosis, PAD (peripheral artery disease), PAD (peripheral artery disease), Palpitations (02/22/2016), Pneumonia, Pre-diabetes, Premature ventricular contraction, PUD (peptic ulcer disease), PVD (peripheral vascular disease) (07/17/2013), Thoracic aortic aneurysm, Tobacco abuse, Wears dentures, and Wears glasses.   Surgical History:   Past Surgical History:  Procedure Laterality Date   ABDOMINAL AORTOGRAM W/LOWER EXTREMITY N/A 03/09/2020   Procedure: ABDOMINAL AORTOGRAM W/LOWER  EXTREMITY;  Surgeon: Gretta Lonni PARAS, MD;  Location: [facility] INVASIVE CV LAB;  Service: Cardiovascular;  Laterality: N/A;   ABDOMINAL AORTOGRAM W/LOWER EXTREMITY N/A 07/26/2022   Procedure: ABDOMINAL AORTOGRAM W/LOWER EXTREMITY;  Surgeon: Gretta Lonni PARAS, MD;  Location: [facility]VAS[facility] CV LAB;  Service: Cardiovascular;  Laterality: N/A;   ABDOMINAL AORTOGRAM W/LOWER EXTREMITY N/A 09/20/2022   Procedure: ABDOMINAL AORTOGRAM W/LOWER EXTREMITY;  Surgeon: Gretta Lonni PARAS, MD;  Location: [facility] INVAS[facility] CV LAB;  Service: Cardiovascular;  Laterality: N/A;   ABDOMINAL HYSTERECTOMY     BREAST SURGERY     left cyst excision x 2   CARDIAC CATHETERIZATION     several years ago, nonobstructive, 50-60% blockages   DIRECT LARYNGOSCOPY Bilateral 03/13/2023   Procedure: MICRO DIRECT LARYNGOSCOPY WITH BIOPSY;  Surgeon: Carlie Clark, MD;  Location: [facility] OR;  Service: ENT;  Laterality: Bilateral;   ENDARTERECTOMY FEMORAL Right 03/18/2020   Procedure: ENDARTERECTOMY FEMORAL WITH PROFUNDAPLASTY;  Surgeon: Gretta Lonni PARAS, MD;  Location: [facility] OR;  Service: Vascular;  Laterality: Right;   FEMORAL-POPLITEAL BYPASS GRAFT Right 03/18/2020   Procedure: BYPASS GRAFT FEMORAL-POPLITEAL ARTERY using GORE PROPATEN VASCULAR GRAFT;  Surgeon: Gretta Lonni PARAS, MD;  Location: [facility] OR;  Service: Vascular;  Laterality: Right;   HIP SURGERY     left - bars and screws placed   HIP SURGERY     INSERTION OF ILIAC STENT Right 03/18/2020   Procedure: INSERTION OF GORE VIABAHN  ILIAC STENT;  Surgeon: Gretta Lonni PARAS, MD;  Location: [facility] OR;  Service: Vascular;  Laterality: Right;   LEFT HEART CATH AND CORONARY ANGIOGRAPHY N/A 04/08/2023   Procedure: LEFT HEART CATH AND CORONARY ANGIOGRAPHY;  Surgeon: Anner Alm ORN, MD;  Location: [facility] INVASIVE CV LAB;  Service: Cardiovascular;  Laterality: N/A;   LEFT HEART CATHETERIZATION WITH CORONARY ANGIOGRAM N/A 07/17/2013   Procedure: LEFT HEART CATHETERIZATION WITH CORONARY ANGIOGRAM;  Surgeon:  Ezra GORMAN Shuck, MD;  Location: [facility] CATH LAB;  Service: Cardiovascular;  Laterality: N/A;   LOWER EXTREMITY ANGIOGRAM Right 03/18/2020   Procedure: RIGHT ILIAC ARTERIOGRAM WITH AN ILIAC STENT;  Surgeon: Gretta Lonni PARAS, MD;  Location: [facility] OR;  Service: Vascular;  Laterality: Right;   MULTIPLE TOOTH EXTRACTIONS     PATCH ANGIOPLASTY Right 03/18/2020   Procedure: PATCH ANGIOPLASTY using a GEORGE BIOLOGIC PATCH OF THE PROFUNDA;  Surgeon: Gretta Lonni PARAS, MD;  Location: [facility] OR;  Service: Vascular;  Laterality: Right;   PERIPHERAL VASCULAR CATHETERIZATION N/A 07/16/2016   Procedure: Abdominal  Aortogram w/Lower Extremity;  Surgeon: Krystal JULIANNA Doing, MD;  Location: [facility] INVASIVE CV LAB;  Service: Cardiovascular;  Laterality: N/A;   PERIPHERAL VASCULAR INTERVENTION Left 03/09/2020   Procedure: PERIPHERAL VASCULAR INTERVENTION;  Surgeon: Gretta Lonni PARAS, MD;  Location: [facility] INVASIVE CV LAB;  Service: Cardiovascular;  Laterality: Left;  common and external illiac    ROTATOR CUFF REPAIR     right side, had torn ligaments as well   TUBAL LIGATION     VEIN BYPASS SURGERY     left leg   WRIST ARTHROCENTESIS     left - broke in 3 places so has bars and screws placed     Social History:   reports that she has quit smoking. Her smoking use included cigarettes. She has a 28.5 pack-year smoking history. She has been exposed to tobacco smoke. She has never used smokeless tobacco. She reports that she does not drink alcohol and does not use drugs.   Family History:  Her family history includes Breast cancer in her maternal grandmother; Cervical cancer in her mother; Colon cancer in her maternal aunt; Heart disease in her father and mother; Liver cancer in her father; Lung cancer in her maternal aunt, maternal aunt, maternal aunt, and maternal uncle; Prostate cancer in her maternal uncle; Stomach cancer in her maternal aunt.   Allergies Allergies  Allergen Reactions   Bee Venom Anaphylaxis and Other (See  Comments)    Welts, also    Insect bites- Welts, also   Insect Extract Anaphylaxis and Other (See Comments)    Insect bites- Welts, also   Farxiga  [Dapagliflozin ] Nausea And Vomiting   Lyrica [Pregabalin] Nausea And Vomiting   Neurontin [Gabapentin] Nausea And Vomiting and Other (See Comments)    Hypertension/dizziness   Nsaids Nausea Only and Other (See Comments)    Patient is not suppose to have this class of medication (liver)  Can tolerate 81mg , which she is taking   Latex Rash and Other (See Comments)     Home Medications  Prior to Admission medications   Medication Sig Start Date End Date Taking? Authorizing Provider  albuterol  (PROVENTIL  HFA;VENTOLIN  HFA) 108 (90 BASE) MCG/ACT inhaler Inhale 2 puffs into the lungs every 6 (six) hours as needed for wheezing or shortness of breath.   Yes [provider]  alendronate  (FOSAMAX ) 70 MG tablet Take 70 mg by mouth every Wednesday. 02/03/16  Yes [provider]  allopurinol  (ZYLOPRIM ) 300 MG tablet Take 300 mg by mouth every evening. 08/02/17  Yes [provider]  aspirin  EC 81 MG tablet Take 81 mg by mouth in the morning.   Yes [provider]  atorvastatin  (LIPITOR ) 80 MG tablet Take 80 mg by mouth at bedtime. 02/04/20  Yes [provider]  calcium  carbonate (ANTACID) 500 MG chewable tablet Chew 1 tablet by mouth 2 (two) times daily as needed for heartburn or indigestion.   Yes [provider]  Cholecalciferol (VITAMIN D ) 125 MCG (5000 UT) CAPS Take 5,000 Units by mouth in the morning.   Yes [provider]  cyanocobalamin  (VITAMIN B12) 1000 MCG tablet Take 1,000 mcg by mouth daily.   Yes [provider]  Dupilumab  (DUPIXENT ) 300 MG/2ML SOAJ Inject 300 mg into the skin every 14 (fourteen) days. 02/28/24  Yes Assaker, Darrin, MD  EPIPEN  2-PAK 0.3 MG/0.3ML SOAJ injection Inject 0.3 mg into the muscle daily as needed for anaphylaxis. Use as directed as needed for  allergies. 12/14/15  Yes [provider]  ezetimibe  (ZETIA )  10 MG tablet Take 1 tablet (10 mg total) by mouth daily. 02/11/24  Yes Nahser, Aleene PARAS, MD  fluticasone -salmeterol (WIXELA INHUB) 500-50 MCG/ACT AEPB Inhale 1 puff into the lungs in the morning and at bedtime. 12/13/23  Yes Assaker, Darrin, MD  furosemide  (LASIX ) 40 MG tablet Take one tablet by mouth twice daily on Mondays, Wednesdays, and Fridays, then take one tablet by mouth daily on other days. 10/21/23  Yes Nahser, Aleene PARAS, MD  Oxycodone  HCl 10 MG TABS Take 1 tablet (10 mg total) by mouth 4 (four) times daily as needed (for pain). 03/22/20  Yes Bethanie Cough, PA-C  pantoprazole  (PROTONIX ) 40 MG tablet Take 40 mg by mouth 2 (two) times daily.   Yes [provider]  potassium chloride  SA (KLOR-CON  M20) 20 MEQ tablet TAKE 1 TABLET BY MOUTH TWICE DAILY ON MONDAYS, WEDNESDAYS, AND FRIDAYS, THEN TAKE ONE TABLET BY MOUTH DAILY ON OTHER DAYS. 11/14/23  Yes Nahser, Aleene PARAS, MD  senna-docusate (SENOKOT-S) 8.6-50 MG tablet Take 2 tablets by mouth at bedtime.   Yes [provider]  SPIRIVA  RESPIMAT 2.5 MCG/ACT AERS Take 2 puffs by mouth daily. 04/03/23  Yes [provider]  spironolactone  (ALDACTONE ) 25 MG tablet Take 1 tablet (25 mg total) by mouth daily. 09/03/17  Yes Maranda Leim DEL, MD  verapamil  (CALAN -SR) 240 MG CR tablet Take 1 tablet (240 mg total) by mouth daily. 02/11/24  Yes Nahser, Aleene PARAS, MD  albuterol  (PROVENTIL ) (2.5 MG/3ML) 0.083% nebulizer solution Take 2.5 mg by nebulization every 6 (six) hours as needed for wheezing or shortness of breath. Patient not taking: Reported on 06/29/2024    [provider]  nitroGLYCERIN  (NITROSTAT ) 0.4 MG SL tablet Place 1 tablet (0.4 mg total) under the tongue every 5 (five) minutes x 3 doses as needed for chest pain. Patient not taking: Reported on 06/29/2024 04/26/16   Kathrine Jeoffrey POUR, NP     Critical care time:   CRITICAL CARE Performed by:  Hayze Gazda D. Harris   Total critical care time: 45 minutes  Critical care time was exclusive of separately billable procedures and treating other patients.  Critical care was necessary to treat or prevent imminent or life-threatening deterioration.  Critical care was time spent personally by me on the following activities: development of treatment plan with patient and/or surrogate as well as nursing, discussions with consultants, evaluation of patient's response to treatment, examination of patient, obtaining history from patient or surrogate, ordering and performing treatments and interventions, ordering and review of laboratory studies, ordering and review of radiographic studies, pulse oximetry and re-evaluation of patient's condition.  Rayshon Albaugh D. Harris, NP-C [facility] Personal contact information can be found on Amion  If no contact or response made please call [phone] 07/12/2024, 2:26 PM

## 2024-07-12 NOTE — Procedures (Signed)
 Intubation Procedure Note  Marie Johnson  [id number]  Jun 04, 1951  Date:07/12/24  Time:2:12 PM   Provider Performing:Tierria Watson JAYSON Sharps    Procedure: Intubation (31500)  Indication(s) Respiratory Failure  Consent Risks of the procedure as well as the alternatives and risks of each were explained to the patient and/or caregiver.  Consent for the procedure was obtained and is signed in the bedside chart   Anesthesia Etomidate, Versed , Fentanyl , and Rocuronium    Time Out Verified patient identification, verified procedure, site/side was marked, verified correct patient position, special equipment/implants available, medications/allergies/relevant history reviewed, required imaging and test results available.   Sterile Technique Usual hand hygeine, masks, and gloves were used   Procedure Description Patient positioned in bed supine.  Sedation given as noted above.  Patient was intubated with endotracheal tube using Glidescope.  View was Grade 1 full glottis .  Number of attempts was 1.  Colorimetric CO2 detector was consistent with tracheal placement.   Complications/Tolerance None; patient tolerated the procedure well. Chest X-ray is ordered to verify placement.   EBL Minimal   Specimen(s) None

## 2024-07-13 ENCOUNTER — Inpatient Hospital Stay (HOSPITAL_COMMUNITY)

## 2024-07-13 DIAGNOSIS — I4891 Unspecified atrial fibrillation: Secondary | ICD-10-CM

## 2024-07-13 DIAGNOSIS — K567 Ileus, unspecified: Secondary | ICD-10-CM | POA: Diagnosis not present

## 2024-07-13 DIAGNOSIS — J9621 Acute and chronic respiratory failure with hypoxia: Secondary | ICD-10-CM

## 2024-07-13 DIAGNOSIS — J449 Chronic obstructive pulmonary disease, unspecified: Secondary | ICD-10-CM

## 2024-07-13 DIAGNOSIS — J690 Pneumonitis due to inhalation of food and vomit: Secondary | ICD-10-CM

## 2024-07-13 DIAGNOSIS — D649 Anemia, unspecified: Secondary | ICD-10-CM

## 2024-07-13 DIAGNOSIS — M7989 Other specified soft tissue disorders: Secondary | ICD-10-CM | POA: Diagnosis not present

## 2024-07-13 LAB — CBC
HCT: 25 % — ABNORMAL LOW (ref 36.0–46.0)
Hemoglobin: 7.8 g/dL — ABNORMAL LOW (ref 12.0–15.0)
MCH: 31.3 pg (ref 26.0–34.0)
MCHC: 31.2 g/dL (ref 30.0–36.0)
MCV: 100.4 fL — ABNORMAL HIGH (ref 80.0–100.0)
Platelets: 324 K/uL (ref 150–400)
RBC: 2.49 MIL/uL — ABNORMAL LOW (ref 3.87–5.11)
RDW: 25.4 % — ABNORMAL HIGH (ref 11.5–15.5)
WBC: 9.5 K/uL (ref 4.0–10.5)
nRBC: 0.8 % — ABNORMAL HIGH (ref 0.0–0.2)

## 2024-07-13 LAB — BASIC METABOLIC PANEL WITH GFR
Anion gap: 10 (ref 5–15)
BUN: 27 mg/dL — ABNORMAL HIGH (ref 8–23)
CO2: 24 mmol/L (ref 22–32)
Calcium: 8.3 mg/dL — ABNORMAL LOW (ref 8.9–10.3)
Chloride: 104 mmol/L (ref 98–111)
Creatinine, Ser: 0.64 mg/dL (ref 0.44–1.00)
GFR, Estimated: >60 mL/min (ref >60)
Glucose, Bld: 191 mg/dL — ABNORMAL HIGH (ref 70–99)
Potassium: 3.2 mmol/L — ABNORMAL LOW (ref 3.5–5.1)
Sodium: 138 mmol/L (ref 135–145)

## 2024-07-13 LAB — MAGNESIUM
Magnesium: 2.1 mg/dL (ref 1.7–2.4)
Magnesium: 2.2 mg/dL (ref 1.7–2.4)

## 2024-07-13 LAB — GLUCOSE, CAPILLARY
Glucose-Capillary: 167 mg/dL — ABNORMAL HIGH (ref 70–99)
Glucose-Capillary: 131 mg/dL — ABNORMAL HIGH (ref 70–99)
Glucose-Capillary: 147 mg/dL — ABNORMAL HIGH (ref 70–99)
Glucose-Capillary: 122 mg/dL — ABNORMAL HIGH (ref 70–99)
Glucose-Capillary: 116 mg/dL — ABNORMAL HIGH (ref 70–99)
Glucose-Capillary: 124 mg/dL — ABNORMAL HIGH (ref 70–99)

## 2024-07-13 LAB — POTASSIUM: Potassium: 4 mmol/L (ref 3.5–5.1)

## 2024-07-13 LAB — TRIGLYCERIDES: Triglycerides: 99 mg/dL (ref <150)

## 2024-07-13 LAB — PHOSPHORUS: Phosphorus: 3.4 mg/dL (ref 2.5–4.6)

## 2024-07-13 MED ORDER — POTASSIUM CHLORIDE 10 MEQ/50ML IV SOLN
10.0000 meq | INTRAVENOUS | Status: AC
  Administered 2024-07-13 (×4): 10 meq via INTRAVENOUS
  Filled 2024-07-13 (×4): qty 50

## 2024-07-13 MED ORDER — REVEFENACIN 175 MCG/3ML IN SOLN
175.0000 ug | Freq: Every day | RESPIRATORY_TRACT | Status: DC
  Administered 2024-07-13 – 2024-07-20 (×8): 175 ug via RESPIRATORY_TRACT
  Filled 2024-07-13 (×8): qty 3

## 2024-07-13 MED ORDER — POLYETHYLENE GLYCOL 3350 17 G PO PACK
17.0000 g | PACK | Freq: Once | ORAL | Status: AC
  Administered 2024-07-13: 17 g

## 2024-07-13 MED ORDER — POTASSIUM CHLORIDE 20 MEQ PO PACK
20.0000 meq | PACK | ORAL | Status: AC
  Administered 2024-07-13 (×2): 20 meq
  Filled 2024-07-13 (×2): qty 1

## 2024-07-13 MED ORDER — TRAVASOL 10 % IV SOLN
INTRAVENOUS | Status: AC
  Filled 2024-07-13: qty 907.2

## 2024-07-13 MED ORDER — ROSUVASTATIN CALCIUM 20 MG PO TABS
20.0000 mg | ORAL_TABLET | Freq: Every day | ORAL | Status: DC
  Administered 2024-07-13 – 2024-07-20 (×8): 20 mg
  Filled 2024-07-13 (×8): qty 1

## 2024-07-13 MED ORDER — HEPARIN (PORCINE) 25000 UT/250ML-% IV SOLN
1200.0000 [IU]/h | INTRAVENOUS | Status: DC
  Administered 2024-07-13 – 2024-07-15 (×3): 1000 [IU]/h via INTRAVENOUS
  Administered 2024-07-17: 1050 [IU]/h via INTRAVENOUS
  Filled 2024-07-13 (×4): qty 250

## 2024-07-13 MED ORDER — POLYETHYLENE GLYCOL 3350 17 G PO PACK
34.0000 g | PACK | Freq: Two times a day (BID) | ORAL | Status: DC
  Administered 2024-07-15 – 2024-07-20 (×10): 34 g
  Filled 2024-07-13 (×12): qty 2

## 2024-07-13 NOTE — Progress Notes (Signed)
 PHARMACY - TOTAL PARENTERAL NUTRITION CONSULT NOTE   Indication: Prolonged ileus  Patient Measurements: Height: 5' 7.5 (171.5 cm) Weight: 66.7 kg (147 lb 0.8 oz) IBW/kg (Calculated) : 62.75 TPN AdjBW (KG): 61.2 Body mass index is 22.69 kg/m. Usual Weight: 140lbs per pt, 135 lbs per chart review   Assessment:  73 yo W admitted with mechanical fall and right femur fracture 10/13 and then found to have ileus on KUB 10/16. Pt was switched to clear liquid diet 10/17-10/20 but vomited what she tried drink. Patient has been NPO since so has had no intake for 7 days. She had N/V and required NG tube placement 10/20 PM with brown particulate output and improvement in N/V. The last meal patient remembers eating is a ham and beef sandwich on 10/13. She normally eats 2-3 meals a day - oatmeal or grits for breakfast (doesn't like eggs), sandwiches and meals from meals on wheels. She is not a big eater but is very hungry as of 10/23 and has liked vanilla ensures in the past. She has been on D5LR since 10/20 PM, receiving 90g dextrose /24hr, which will help decrease refeeding risk. Pharmacy consulted for TPN.   Glucose / Insulin: BG 124-212, used 9 units SSI/24hr; no hx DM Electrolytes: K 3.2 down (receiving today), CoCa 9.6, others wnl; Cl improved, CO2 trending down Renal: Scr 0.64, BUN up slightly at 27 Hepatic: Alk phos/AST/AL/Tbili wnl, albumin  2.4, TG 68 Intake / Output; MIVF: UOP not charted, LBM 10/26  type 6 x2  - maintains on Neostigmine q6h, stopping Lactulose  due to gas, Increasing Miralax  to 34g BID GI Imaging: 10/16 KUB: Diffuse bowel gas distension throughout the abdomen, possible ileus  10/17 CT: Gaseous distention of the colon without discrete focal transition point or mass lesion likely representing ileus 10/20 KUB: Persistent gaseous dilatation of the large bowel 10/21 KUB: moderate diffuse bowel distension, consistent with ileus 10/24 KUB:  Gaseous distension of large and small  bowel consistent with ileus, slightly improved from prior. GI Surgeries / Procedures:  10/24 NGT removed   Central access: PICC 10/23  TPN start date: 10/23  Nutritional Goals: Goal TPN rate with full lipids is 70 mL/hr (provides 90 g of protein and 1705 kcals per day) -meeting 100% needs  Goal TPN rate with reduced lipids (25%) is 70 mL/hr (provides 90 g of protein and 1589 kcals per day) -with Propofol  meeting 100% needs  RD Assessment: Estimated Needs Total Energy Estimated Needs: 1700-1900 kcal/d Total Protein Estimated Needs: 80-95g/d Total Fluid Estimated Needs: 1.8L/d  Current Nutrition:  TPN 10/23 CLD 10/24 FLD- had beef broth, cranberry juice, coffee, chocolate pudding, apple sauce, no Ensure 10/25 Soft - patient in a lot of pain, told RN she doesn't like Ensure and refused. Only had soup and ice cream 10/26 Soft - patient willing to try Ensure  10/27 NPO awaiting CT - ok for meds per GI; Propofol  added providing ~300 kcals  Plan:  Adjust TPN to new goal 70 mL/hr with reduced lipids at 1800, provides 100% estimated needs with propofol  Electrolytes in TPN: Na 150 mEq/L, K 50 mEq/L, Ca 2 mEq/L, Mg 10 mEq/L, and Phos 10 mmol/L.Adjust Cl:Ac to 1:1 Per tube MVI and trace elements   Continue sensitive SSI q4h and monitor Monitor TPN labs Mon/Thurs and PRN F/u CT and ability to restart PO intake  Harlene Boga, PharmD, BCPS, BCCCP Clinical Pharmacist Please check AMION for all [facility] Pharmacy phone numbers After 10:00 PM, call Main Pharmacy [phone]

## 2024-07-13 NOTE — Progress Notes (Signed)
 Nutrition Follow-up  DOCUMENTATION CODES:  Non-severe (moderate) malnutrition in context of chronic illness  INTERVENTION:  Continue TPN Nutrition recommendations are below Management per pharmacy team Monitor for ability to trial enteral feeds  NUTRITION DIAGNOSIS:  Moderate Malnutrition related to chronic illness as evidenced by energy intake < 75% for > or equal to 1 month, mild fat depletion, moderate muscle depletion. - remains applicable   GOAL:  Patient will meet greater than or equal to 90% of their needs - progressing  MONITOR:  PO intake, Supplement acceptance, Weight trends  REASON FOR ASSESSMENT:  Consult New TPN/TNA  ASSESSMENT:  Pt with hx of GERD, HTN, HLD, PAD, CAD, COPD (on home O2), CHF, hx of laryngeal cancer presented to ED after a fall at home. Imaging showed a periprosthetic hip fx.Ortho consulted and planned for non-operative management.  10/13 - presented to ED after a fall 10/19 - NGT placed for decompression 10/23 - PICC placed, TPN initiated 10/24 - NGT removed 10/26 - respiratory distress, transferred to ICU, intubated   Patient is currently intubated on ventilator support. No family present at this time. On exam, abdomen very distended and tight. TPN still being utilized. Adjusted nutrition needs for change in clinical status, discussed with pharmacy team.   Pt discussed during ICU rounds and with RN and MD. Pt to go down for CT with contrast this PM to evaluate abdomen.   MV: 10.5 L/min Temp (24hrs), Avg:97.4 F (36.3 C), Min:97 F (36.1 C), Max:97.7 F (36.5 C) MAP (cuff): 68-99 mmHg  Propofol : 12 ml/hr (317 kcal/d)  Admit weight: 61.2 kg   Current weight: 66.7 kg  Noted pt with pitting edema to the BLE 10/27   Intake/Output Summary (Last 24 hours) at 07/13/2024 1534 Last data filed at 07/13/2024 1400 Gross per 24 hour  Intake 2889.45 ml  Output 1335 ml  Net 1554.45 ml  Net IO Since Admission: 3,592 mL [07/13/24  1534]  Drains/Lines: PICC double lumen OGT 16 Fr. x 24 hours UOP x 24 hours  Average Meal Intake: 10/14-10/19: 73% intake x 6 recorded meals  Nutritionally Relevant Medications: Scheduled Meds:  docusate  100 mg Per Tube BID   feeding supplement  237 mL Per Tube BID BM   insulin aspart  0-9 Units Subcutaneous Q4H   lactulose   30 g Per Tube TID   multivitamin with minerals  1 tablet Per Tube Daily   neostigmine  0.25 mg Subcutaneous Q6H   pantoprazole  IV  40 mg Intravenous Q12H   polyethylene glycol  17 g Per Tube Daily   potassium chloride   20 mEq Per Tube Q4H   Continuous Infusions:  piperacillin-tazobactam (ZOSYN)  IV 12.5 mL/hr at 07/13/24 0800   potassium chloride  10 mEq (07/13/24 0818)   propofol  (DIPRIVAN ) infusion 30 mcg/kg/min (07/13/24 0823)   TPN ADULT (ION) 65 mL/hr at 07/13/24 0800   PRN Meds: ondansetron , phenol, phenylephrine , prochlorperazine, simethicone  Labs Reviewed: Potassium 3.2 BUN 27 CBG ranges from 131-212 mg/dL over the last 24 hours HgbA1c 5.5% (10/26)  Micronutrient Profile: Folate 16.9 Iron 61 Vitamin D  32.8 Vitamin B12 371  NUTRITION - FOCUSED PHYSICAL EXAM: Flowsheet Row Most Recent Value  Orbital Region Moderate depletion  Upper Arm Region Moderate depletion  Thoracic and Lumbar Region Mild depletion  Buccal Region Mild depletion  Temple Region Moderate depletion  Clavicle Bone Region Moderate depletion  Clavicle and Acromion Bone Region Severe depletion  Scapular Bone Region Severe depletion  Dorsal Hand Moderate depletion  Patellar Region  Moderate depletion  Anterior Thigh Region Moderate depletion  Posterior Calf Region Severe depletion  Edema (RD Assessment) Moderate  [pitting edema to the BLE, right side more swollen than left]  Hair Reviewed  Eyes Reviewed  Mouth Reviewed  Skin Reviewed  Nails Reviewed    Diet Order:   Diet Order             Diet NPO time specified  Diet effective now                    EDUCATION NEEDS:  Education needs have been addressed  Skin:  Skin Assessment: Reviewed RN Assessment  Last BM:  10/26 - type 6  Height:  Ht Readings from Last 1 Encounters:  07/03/24 5' 7.5 (1.715 m)    Weight:  Wt Readings from Last 1 Encounters:  07/10/24 66.7 kg    Ideal Body Weight:  61.4 kg  BMI:  Body mass index is 22.69 kg/m.  Estimated Nutritional Needs:  Kcal:  1700-1900 kcal/d Protein:  80-95g/d Fluid:  1.8L/d    Vernell Lukes, RD, LDN, CNSC Registered Dietitian II Please reach out via secure chat

## 2024-07-13 NOTE — Progress Notes (Signed)
 eLink Physician-Brief Progress Note Patient Name: Marie Johnson DOB: [birth year] MRN: [id number]   Date of Service  07/13/2024  HPI/Events of Note  Bedside RN reports that patient is having constant liquid stools. Patient has an ileus and she says they can't keep her clean and she's getting skin breakdown. Requesting a Flexiseal order- unfortunately she does have hemorrhoids- but RN unsure a pouch would be sufficient to contain the stool as it is very watery.   AM potassium at 3.2, mag was normal. Anemia: at 7.8 , no bleeding.  eICU Interventions  Get Potassium and mag stat, could be low, if low replace. Would go for rectal pouch and hold bowel regimen for tonight.        Intervention Category Intermediate Interventions: Other:  Jodelle ONEIDA Hutching 07/13/2024, 9:56 PM  0435 Anemia worsening to 6.8, no active bleeding anywhere while on heparin  drip.  Stat 1 PRBC, T and S ordered. MAP good, on 40% fio2.

## 2024-07-13 NOTE — Progress Notes (Signed)
 [facility] ADULT ICU REPLACEMENT PROTOCOL   The patient does apply for the [facility] Adult ICU Electrolyte Replacment Protocol based on the criteria listed below:   1.Exclusion criteria: TCTS, ECMO, Dialysis, and Myasthenia Gravis patients 2. Is GFR >/= 30 ml/min? Yes.    Patient's GFR today is >60 3. Is SCr </= 2? Yes.   Patient's SCr is 0.64 mg/dL 4. Did SCr increase >/= 0.5 in 24 hours? No. 5.Pt's weight >40kg  Yes.   6. Abnormal electrolyte(s): K  7. Electrolytes replaced per protocol 8.  Call MD STAT for K+ </= 2.5, Phos </= 1, or Mag </= 1 Physician:  Joelyn Hunter BRAVO Dustine Bertini 07/13/2024 5:13 AM

## 2024-07-13 NOTE — Progress Notes (Signed)
 PHARMACY - ANTICOAGULATION CONSULT NOTE  Pharmacy Consult for IV heparin   Indication: atrial fibrillation  Allergies  Allergen Reactions   Bee Venom Anaphylaxis and Other (See Comments)    Welts, also    Insect bites- Welts, also   Insect Extract Anaphylaxis and Other (See Comments)    Insect bites- Welts, also   Farxiga  [Dapagliflozin ] Nausea And Vomiting   Lyrica [Pregabalin] Nausea And Vomiting   Neurontin [Gabapentin] Nausea And Vomiting and Other (See Comments)    Hypertension/dizziness   Nsaids Nausea Only and Other (See Comments)    Patient is not suppose to have this class of medication (liver)  Can tolerate 81mg , which she is taking   Latex Rash and Other (See Comments)    Patient Measurements: Height: 5' 7.5 (171.5 cm) Weight: 66.7 kg (147 lb 0.8 oz) IBW/kg (Calculated) : 62.75 HEPARIN  DW (KG): 61.2  Vital Signs: Temp: 97.7 F (36.5 C) (10/27 0800) Temp Source: Oral (10/27 0800) BP: 111/51 (10/27 1430) Pulse Rate: 95 (10/27 1430)  Labs: Recent Labs    07/11/24 0850 07/12/24 0609 07/12/24 0609 07/12/24 1558 07/12/24 1752 07/13/24 0310  HGB  --  8.7*   < > 8.5*  --  7.8*  HCT  --  28.2*  --  25.0*  --  25.0*  PLT  --  383  --   --   --  324  CREATININE 0.58 0.51  --   --   --  0.64  TROPONINIHS  --   --   --   --  68*  --    < > = values in this interval not displayed.    Estimated Creatinine Clearance: 60.2 mL/min (by C-G formula based on SCr of 0.64 mg/dL).   Medical History: Past Medical History:  Diagnosis Date   AAA (abdominal aortic aneurysm)    3.4 cm   Anemia    Anxiety    Aortic atherosclerosis    Arthritis    Asthma    CAD (coronary artery disease)    a. Nonobstructive CAD by cath 07/17/13.   Cancer [facility])    Carotid artery disease    Carotid US  8/22: Bilateral ICA 1-39; right subclavian stenosis; right thyroid  nodule   CHD (congenital heart disease)    pt unaware???   Chest pain    a. Adm 10-07/2013 - CTA neg for  PE/dissection, cath with nonobstructive disease, CP ?due to uncontrolled HTN vs coronary vasospasm.   CHF (congestive heart failure) (HCC)    Closed head injury with concussion    COPD (chronic obstructive pulmonary disease) (HCC)    Coronary artery disease    Echocardiogram    Echocardiogram 06/2019: EF 60-65, impaired relaxation (Gr 1 DD), mild MAC, mild MR, mild to mod aortic valve sclerosis (no AS), trivial TR, mild LAE, normal RVSF, RVSP 30.7 (mildly elevated)   GERD (gastroesophageal reflux disease)    History of blood transfusion    childbirth, Hysterectomy   Hyperlipidemia    Hypertension    Neuropathy    OA (osteoarthritis)    Osteoporosis    PAD (peripheral artery disease)    a. s/p L fem-pop bypass b. recent evaluation at [facility], unamenable to intervention   PAD (peripheral artery disease)    Palpitations 02/22/2016   Pneumonia    Pre-diabetes    Premature ventricular contraction    PUD (peptic ulcer disease)    PVD (peripheral vascular disease) 07/17/2013   Thoracic aortic aneurysm    Chest/aorta CTA 04/2022: 14 x  8 focal saccular aneurysm versus penetrating atherosclerotic ulcer involving left side of proximal portion of transverse aortic arch; descending thoracic aorta and proximal abdominal aorta aneurysm 4.4 cm; aortic atherosclerosis; emphysema   Tobacco abuse    Wears dentures    Wears glasses     Medications:  Medications Prior to Admission  Medication Sig Dispense Refill Last Dose/Taking   albuterol  (PROVENTIL  HFA;VENTOLIN  HFA) 108 (90 BASE) MCG/ACT inhaler Inhale 2 puffs into the lungs every 6 (six) hours as needed for wheezing or shortness of breath.   06/29/2024 Evening   alendronate  (FOSAMAX ) 70 MG tablet Take 70 mg by mouth every Wednesday.   Past Week   allopurinol  (ZYLOPRIM ) 300 MG tablet Take 300 mg by mouth every evening.   06/29/2024 Noon   aspirin  EC 81 MG tablet Take 81 mg by mouth in the morning.   06/29/2024 Morning   atorvastatin  (LIPITOR ) 80 MG  tablet Take 80 mg by mouth at bedtime.   06/29/2024 Morning   calcium  carbonate (ANTACID) 500 MG chewable tablet Chew 1 tablet by mouth 2 (two) times daily as needed for heartburn or indigestion.   Past Week   Cholecalciferol (VITAMIN D ) 125 MCG (5000 UT) CAPS Take 5,000 Units by mouth in the morning.   06/29/2024 Morning   cyanocobalamin  (VITAMIN B12) 1000 MCG tablet Take 1,000 mcg by mouth daily.   06/29/2024 Morning   Dupilumab  (DUPIXENT ) 300 MG/2ML SOAJ Inject 300 mg into the skin every 14 (fourteen) days. 12 mL 1 Past Week   EPIPEN  2-PAK 0.3 MG/0.3ML SOAJ injection Inject 0.3 mg into the muscle daily as needed for anaphylaxis. Use as directed as needed for allergies.   Taking As Needed   ezetimibe  (ZETIA ) 10 MG tablet Take 1 tablet (10 mg total) by mouth daily. 90 tablet 1 06/28/2024 Evening   fluticasone -salmeterol (WIXELA INHUB) 500-50 MCG/ACT AEPB Inhale 1 puff into the lungs in the morning and at bedtime. 3 each 3 06/29/2024 Morning   furosemide  (LASIX ) 40 MG tablet Take one tablet by mouth twice daily on Mondays, Wednesdays, and Fridays, then take one tablet by mouth daily on other days. 120 tablet 2 06/29/2024 Morning   Oxycodone  HCl 10 MG TABS Take 1 tablet (10 mg total) by mouth 4 (four) times daily as needed (for pain). 20 tablet 0 06/29/2024 Morning   pantoprazole  (PROTONIX ) 40 MG tablet Take 40 mg by mouth 2 (two) times daily.   06/29/2024 Morning   potassium chloride  SA (KLOR-CON  M20) 20 MEQ tablet TAKE 1 TABLET BY MOUTH TWICE DAILY ON MONDAYS, WEDNESDAYS, AND FRIDAYS, THEN TAKE ONE TABLET BY MOUTH DAILY ON OTHER DAYS. 120 tablet 2 06/29/2024 Morning   senna-docusate (SENOKOT-S) 8.6-50 MG tablet Take 2 tablets by mouth at bedtime.   06/28/2024 Bedtime   SPIRIVA  RESPIMAT 2.5 MCG/ACT AERS Take 2 puffs by mouth daily.   06/29/2024 Morning   spironolactone  (ALDACTONE ) 25 MG tablet Take 1 tablet (25 mg total) by mouth daily. 90 tablet 3 06/29/2024 Morning   verapamil  (CALAN -SR) 240 MG CR  tablet Take 1 tablet (240 mg total) by mouth daily. 90 tablet 1 06/29/2024 Morning   albuterol  (PROVENTIL ) (2.5 MG/3ML) 0.083% nebulizer solution Take 2.5 mg by nebulization every 6 (six) hours as needed for wheezing or shortness of breath. (Patient not taking: Reported on 06/29/2024)   Not Taking   nitroGLYCERIN  (NITROSTAT ) 0.4 MG SL tablet Place 1 tablet (0.4 mg total) under the tongue every 5 (five) minutes x 3 doses as needed for chest pain. (Patient  not taking: Reported on 06/29/2024) 25 tablet 4 Not Taking    Assessment: 73 yo F presenting s/p fall with hip fracture and ileus. Patient with new onset afib this admission. Pharmacy consulted for heparin  dosing. Deferring bolus given concern for bowel perforation and slight drop in hemoglobin today (Hgb 7.8) and platelets stable: 324.  Goal of Therapy:  Heparin  level 0.3-0.7 units/ml Monitor platelets by anticoagulation protocol: Yes   Plan:  Start heparin  infusion at 1000 units/hr Check anti-Xa level in 8 hours and daily while on heparin  Continue to monitor H&H and platelets  Elma Fail, PharmD PGY1 Clinical Pharmacist [facility]  07/13/2024 2:56 PM

## 2024-07-13 NOTE — Progress Notes (Signed)
 Sharyne CHRISTELLA Public 9:48 AM  Subjective: Patient seen and examined and her hospital computer chart reviewed and her case discussed with my partner Dr. Kriss and she has not been seen by me before and her case discussed with her nurse and she is currently intubated and sedated  Objective: Vital signs stable afebrile abdomen is a little distended a little firm no obvious tenderness potassium little low white count okay repeat CT pending  Assessment: Postop ileus  Plan: Try to get K above 4 await CT scan minimize narcotics and probably would use an increased dose of MiraLAX  compared with lactulose  which may be broken down and cause more gas and some people and will check on later in the week and please call sooner if a specific GI question arises [facility]  office [phone] After 5PM or if no answer call [phone]

## 2024-07-13 NOTE — TOC Progression Note (Signed)
 Transition of Care [facility]) - Progression Note    Patient Details  Name: SHERMA VANMETRE MRN: [id number] Date of Birth: 1951/02/15  Transition of Care [facility]) CM/SW Contact  Lauraine FORBES Saa, LCSWA Phone Number: 07/13/2024, 1:23 PM  Clinical Narrative:     1:24 PM Per chart review, patient was intubated and transferred to the MICU yesterday. Patient is currently intubated in the MICU with feeding tube. CSW to submit SNF insurance authorization as patient becomes medically ready for discharge to [facility]. CSW will continue to follow.  Expected Discharge Plan: Skilled Nursing Facility Barriers to Discharge: Continued Medical Work up, SNF Pending bed offer               Expected Discharge Plan and Services In-house Referral: Clinical Social Work   Post Acute Care Choice: Skilled Nursing Facility Living arrangements for the past 2 months: Single Family Home                                       Social Drivers of Health (SDOH) Interventions SDOH Screenings   Food Insecurity: No Food Insecurity (06/29/2024)  Housing: High Risk (06/29/2024)  Transportation Needs: No Transportation Needs (06/29/2024)  Utilities: Not At Risk (06/29/2024)  Depression (PHQ2-9): Low Risk  (05/02/2023)  Social Connections: Moderately Isolated (06/29/2024)  Tobacco Use: Medium Risk (06/29/2024)    Readmission Risk Interventions    06/06/2023   12:35 PM  Readmission Risk Prevention Plan  Transportation Screening Complete  PCP or Specialist Appt within 5-7 Days Complete  Home Care Screening Complete  Medication Review (RN CM) Complete

## 2024-07-13 NOTE — Progress Notes (Signed)
 RT transported pt on ventilator from 2M02 to CT and back to 2M02 without any issues. RN @ bedside.

## 2024-07-13 NOTE — Progress Notes (Signed)
 Bilateral lower extremity venous duplex has been completed. Preliminary results can be found in CV Proc through chart review.   07/13/24 12:14 PM Cathlyn Collet RVT

## 2024-07-13 NOTE — Progress Notes (Signed)
 NAME:  Marie Johnson, MRN:  [id number], DOB:  1950-11-20, LOS: 14 ADMISSION DATE:  06/29/2024, CONSULTATION DATE:  07/12/2024 REFERRING MD:  TRH, CHIEF COMPLAINT:   Acute respiratory distress  History of Present Illness:  Marie Johnson is a 73 y.o. female with a past medical history significant for but not limited to CHF, COPD, chronic hypoxic respiratory failure on 2 L nasal cannula at baseline, HTN, HLD, PAD, osteoporosis, and anxiety initially presented to the ED at [facility] 10/13 after suffering mechanical fall resulting in right periprosthetic femur fracture.  Since admission patient has had several complications, see timeline below  Pertinent  Medical History  CHF, COPD, chronic hypoxic respiratory failure on 2 L nasal cannula at baseline, HTN, HLD, PAD, osteoporosis, and anxiety  Significant Hospital Events: Including procedures, antibiotic start and stop dates in addition to other pertinent events   10/13 presented after suffering mechanical fall at home resulting in acute periprosthetic right femur fracture 10/16 abdominal distention noticed, KUB obtained 10/17 colonic ileus confirmed 10/18 GI consulted recommended medical management 10/21 chest x-ray with patchy densities in the right lung concerning for aspiration pneumonia in the setting of ileus 10/23 TPN started via PICC line 10/25 developed worsening respiratory distress with change in mentation transferred to ICU and emergently admitted  Interim History / Subjective:  Overnight no acute events. Very responsive to changes in sedation; was batting at her ETT this morning per RN.   Objective    Blood pressure 114/65, pulse (!) 108, temperature (!) 97 F (36.1 C), temperature source Axillary, resp. rate (!) 26, height 5' 7.5 (1.715 m), weight 66.7 kg, SpO2 96%.    Vent Mode: PRVC FiO2 (%):  [40 %-100 %] 40 % Set Rate:  [24 bmp] 24 bmp Vt Set:  [500 mL] 500 mL PEEP:  [5 cmH20] 5 cmH20 Plateau Pressure:  [11 cmH20-19  cmH20] 11 cmH20   Intake/Output Summary (Last 24 hours) at 07/13/2024 0748 Last data filed at 07/13/2024 0600 Gross per 24 hour  Intake 2611.69 ml  Output 1300 ml  Net 1311.69 ml   Filed Weights   06/30/24 1100 07/03/24 2004 07/10/24 0500  Weight: 61.2 kg 61.2 kg 66.7 kg    Examination: General:  critically ill appearing woman lying in bed in NAD HEENT:  [place]/AT, eyes anicteric  Neuro: RASS -5, breathing over vent CV: S1S2, RRR PULM: breathing comfortably on MV, thick blood-tinged tan sputum. Rhonchi.  GI: distended, tympanitic to percussion, high pitched hypoactive bowel sounds Extremities: no peripheral edema   K+ 3.2 BUN 27 Cr 0.64 WBC 9.5 H/H 7.8/25 Platelets 324 LA 2.5> 1.3 No culture data  CT abd: lots of dilated loops of bowel with intraluminal air fluid levels, no transition point   Resolved problem list   Assessment and Plan  Acute on chronic hypoxic respiratory failure on 2 L nasal cannula at baseline due to history of COPD- -10/26 emergently intubated due to worsening respiratory distress Aspiration pneumonia RLL -LTVV -VAP prevention protocol -collect trach aspirate culture -daily SAT & SBT as appropriate; need to resolve ileus first -bronchodilators- stop pulmicort , add revefenacin -7 days of zosyn; start date 10/26  Prolonged ongoing colonic ileus - Developed pretty early on stay given opioid use during acute fracture, immobility.  TPN started 10/23 -repeat CT abd and pelvis per GI's recommendations -con't bowel regimen; getting IV neostigmine q6h. Stop lactulose , increase miralax  today. -monitor electrolytes, replete as needed -con't TPN; still needs CVC for this. On low dose lipids in TPN  due to propofol  use.  New onset A-fib RVR, due to critical illness. Rate control improved today. -amiodarone -start heparin  gtt -tele monitoring -monitor electrolytes; replete as needed  Acute periprosthetic right femur fracture -per 10/4 ortho note,  nonoperative management. WBAT. -needs OP follow up with [facility]  Anemia of chronic disease B12 deficiency -transfuse for Hb <7 or hemodynamically significant bleeding  Chronic HFpEF History of CAD-Status post [facility] July 2024 with mild to moderate nonobstructive disease was previously on DAPT for PAD Hyperlipidemia Essential hypertension PAD -con't aspirin  -rosuvastatin     Labs   CBC: Recent Labs  Lab 07/07/24 0445 07/08/24 0531 07/09/24 0533 07/12/24 0609 07/12/24 1558 07/13/24 0310  WBC 8.5 8.9 8.1 10.5  --  9.5  NEUTROABS 7.1 7.1  --   --   --   --   HGB 8.5* 8.8* 8.6* 8.7* 8.5* 7.8*  HCT 26.8* 28.4* 28.0* 28.2* 25.0* 25.0*  MCV 99.3 101.4* 100.4* 100.0  --  100.4*  PLT 384 411* 385 383  --  324    Basic Metabolic Panel: Recent Labs  Lab 07/09/24 0533 07/10/24 0355 07/11/24 0850 07/12/24 0609 07/12/24 1558 07/13/24 0310  NA 141 139 134* 137 139 138  K 4.2 3.6 4.0 3.5 3.7 3.2*  CL 103 103 97* 100  --  104  CO2 29 29 28 27   --  24  GLUCOSE 76 151* 150* 182*  --  191*  BUN 8 14 18 20   --  27*  CREATININE 0.53 0.65 0.58 0.51  --  0.64  CALCIUM  8.1* 8.1* 8.2* 8.4*  --  8.3*  MG 1.9 2.0 2.1 1.9  --  2.1  PHOS 3.0 3.4 3.3 3.6  --  3.4   GFR: Estimated Creatinine Clearance: 60.2 mL/min (by C-G formula based on SCr of 0.64 mg/dL). Recent Labs  Lab 07/08/24 0531 07/09/24 0533 07/12/24 0609 07/12/24 1422 07/12/24 1752 07/13/24 0310  WBC 8.9 8.1 10.5  --   --  9.5  LATICACIDVEN  --   --   --  2.5* 1.3  --     Liver Function Tests: Recent Labs  Lab 07/07/24 0445 07/08/24 0531 07/09/24 0533 07/10/24 0355 07/11/24 0850 07/12/24 0609  AST 31 25 20   --   --  19  ALT 14 13 12   --   --  12  ALKPHOS 57 58 52  --   --  74  BILITOT 1.1 1.0 1.3*  --   --  0.8  PROT 6.4* 6.0* 6.0*  --   --  6.3*  ALBUMIN  2.4* 2.4* 2.2* 2.1* 2.3* 2.4*   Critical care time:     This patient is critically ill with multiple organ system failure which requires  frequent high complexity decision making, assessment, support, evaluation, and titration of therapies. This was completed through the application of advanced monitoring technologies and extensive interpretation of multiple databases. During this encounter critical care time was devoted to patient care services described in this note for 38 minutes.  Leita SHAUNNA Gaskins, DO 07/13/24 3:40 PM [facility]  For contact information, see Amion. If no response to pager, please call PCCM consult pager. After hours, 7PM- 7AM, please call Elink.

## 2024-07-13 NOTE — Plan of Care (Signed)
   Problem: Education: Goal: Knowledge of General Education information will improve Description Including pain rating scale, medication(s)/side effects and non-pharmacologic comfort measures Outcome: Progressing   Problem: Health Behavior/Discharge Planning: Goal: Ability to manage health-related needs will improve Outcome: Progressing

## 2024-07-13 NOTE — Plan of Care (Incomplete)
 FC, transported to CT ABD, 2 bms. UOP: . Called and updated.  Problem: Education: Goal: Knowledge of General Education information will improve Description: Including pain rating scale, medication(s)/side effects and non-pharmacologic comfort measures Outcome: Progressing   Problem: Health Behavior/Discharge Planning: Goal: Ability to manage health-related needs will improve Outcome: Progressing   Problem: Clinical Measurements: Goal: Ability to maintain clinical measurements within normal limits will improve Outcome: Progressing Goal: Will remain free from infection Outcome: Progressing Goal: Diagnostic test results will improve Outcome: Progressing Goal: Respiratory complications will improve Outcome: Progressing Goal: Cardiovascular complication will be avoided Outcome: Progressing   Problem: Activity: Goal: Risk for activity intolerance will decrease Outcome: Progressing   Problem: Nutrition: Goal: Adequate nutrition will be maintained Outcome: Progressing   Problem: Coping: Goal: Level of anxiety will decrease Outcome: Progressing   Problem: Elimination: Goal: Will not experience complications related to bowel motility Outcome: Progressing Goal: Will not experience complications related to urinary retention Outcome: Progressing   Problem: Pain Managment: Goal: General experience of comfort will improve and/or be controlled Outcome: Progressing   Problem: Safety: Goal: Ability to remain free from injury will improve Outcome: Progressing   Problem: Skin Integrity: Goal: Risk for impaired skin integrity will decrease Outcome: Progressing   Problem: Education: Goal: Ability to describe self-care measures that may prevent or decrease complications (Diabetes Survival Skills Education) will improve Outcome: Progressing Goal: Individualized Educational Video(s) Outcome: Progressing   Problem: Coping: Goal: Ability to adjust to condition or change in health will  improve Outcome: Progressing   Problem: Fluid Volume: Goal: Ability to maintain a balanced intake and output will improve Outcome: Progressing   Problem: Health Behavior/Discharge Planning: Goal: Ability to identify and utilize available resources and services will improve Outcome: Progressing Goal: Ability to manage health-related needs will improve Outcome: Progressing   Problem: Metabolic: Goal: Ability to maintain appropriate glucose levels will improve Outcome: Progressing   Problem: Nutritional: Goal: Maintenance of adequate nutrition will improve Outcome: Progressing Goal: Progress toward achieving an optimal weight will improve Outcome: Progressing   Problem: Skin Integrity: Goal: Risk for impaired skin integrity will decrease Outcome: Progressing   Problem: Tissue Perfusion: Goal: Adequacy of tissue perfusion will improve Outcome: Progressing

## 2024-07-14 ENCOUNTER — Telehealth (HOSPITAL_COMMUNITY): Payer: Self-pay

## 2024-07-14 ENCOUNTER — Other Ambulatory Visit (HOSPITAL_COMMUNITY): Payer: Self-pay

## 2024-07-14 DIAGNOSIS — J9621 Acute and chronic respiratory failure with hypoxia: Secondary | ICD-10-CM | POA: Diagnosis not present

## 2024-07-14 DIAGNOSIS — K567 Ileus, unspecified: Secondary | ICD-10-CM | POA: Diagnosis not present

## 2024-07-14 DIAGNOSIS — J690 Pneumonitis due to inhalation of food and vomit: Secondary | ICD-10-CM | POA: Diagnosis not present

## 2024-07-14 DIAGNOSIS — J449 Chronic obstructive pulmonary disease, unspecified: Secondary | ICD-10-CM | POA: Diagnosis not present

## 2024-07-14 LAB — CBC
HCT: 23 % — ABNORMAL LOW (ref 36.0–46.0)
Hemoglobin: 6.8 g/dL — CL (ref 12.0–15.0)
MCH: 30.4 pg (ref 26.0–34.0)
MCHC: 29.6 g/dL — ABNORMAL LOW (ref 30.0–36.0)
MCV: 102.7 fL — ABNORMAL HIGH (ref 80.0–100.0)
Platelets: 297 K/uL (ref 150–400)
RBC: 2.24 MIL/uL — ABNORMAL LOW (ref 3.87–5.11)
RDW: 26.1 % — ABNORMAL HIGH (ref 11.5–15.5)
WBC: 10.7 K/uL — ABNORMAL HIGH (ref 4.0–10.5)
nRBC: 0.7 % — ABNORMAL HIGH (ref 0.0–0.2)

## 2024-07-14 LAB — GLUCOSE, CAPILLARY
Glucose-Capillary: 149 mg/dL — ABNORMAL HIGH (ref 70–99)
Glucose-Capillary: 121 mg/dL — ABNORMAL HIGH (ref 70–99)
Glucose-Capillary: 124 mg/dL — ABNORMAL HIGH (ref 70–99)
Glucose-Capillary: 127 mg/dL — ABNORMAL HIGH (ref 70–99)
Glucose-Capillary: 116 mg/dL — ABNORMAL HIGH (ref 70–99)
Glucose-Capillary: 146 mg/dL — ABNORMAL HIGH (ref 70–99)

## 2024-07-14 LAB — HEPARIN LEVEL (UNFRACTIONATED)
Heparin Unfractionated: 0.53 [IU]/mL (ref 0.30–0.70)
Heparin Unfractionated: 0.63 [IU]/mL (ref 0.30–0.70)

## 2024-07-14 LAB — BASIC METABOLIC PANEL WITH GFR
Anion gap: 8 (ref 5–15)
BUN: 29 mg/dL — ABNORMAL HIGH (ref 8–23)
CO2: 23 mmol/L (ref 22–32)
Calcium: 8 mg/dL — ABNORMAL LOW (ref 8.9–10.3)
Chloride: 108 mmol/L (ref 98–111)
Creatinine, Ser: 0.71 mg/dL (ref 0.44–1.00)
GFR, Estimated: >60 mL/min (ref >60)
Glucose, Bld: 127 mg/dL — ABNORMAL HIGH (ref 70–99)
Potassium: 4.2 mmol/L (ref 3.5–5.1)
Sodium: 139 mmol/L (ref 135–145)

## 2024-07-14 LAB — PREPARE RBC (CROSSMATCH)

## 2024-07-14 LAB — MAGNESIUM: Magnesium: 2.3 mg/dL (ref 1.7–2.4)

## 2024-07-14 LAB — PHOSPHORUS: Phosphorus: 3.7 mg/dL (ref 2.5–4.6)

## 2024-07-14 MED ORDER — DEXMEDETOMIDINE HCL IN NACL 400 MCG/100ML IV SOLN
0.0000 ug/kg/h | INTRAVENOUS | Status: DC
  Administered 2024-07-14: 1.2 ug/kg/h via INTRAVENOUS
  Administered 2024-07-15: 0.7 ug/kg/h via INTRAVENOUS
  Administered 2024-07-15: 0.2 ug/kg/h via INTRAVENOUS
  Administered 2024-07-16 – 2024-07-19 (×18): 1.2 ug/kg/h via INTRAVENOUS
  Filled 2024-07-14 (×21): qty 100

## 2024-07-14 MED ORDER — CHLORHEXIDINE GLUCONATE CLOTH 2 % EX PADS
6.0000 | MEDICATED_PAD | CUTANEOUS | Status: DC
  Administered 2024-07-16 – 2024-07-19 (×5): 6 via TOPICAL

## 2024-07-14 MED ORDER — SODIUM CHLORIDE 0.9% IV SOLUTION: Freq: Once | INTRAVENOUS | Status: DC

## 2024-07-14 MED ORDER — TRAVASOL 10 % IV SOLN
INTRAVENOUS | Status: AC
  Filled 2024-07-14: qty 907.2

## 2024-07-14 NOTE — Progress Notes (Signed)
 PHARMACY - ANTICOAGULATION CONSULT NOTE  Pharmacy Consult for IV heparin   Indication: atrial fibrillation  Allergies  Allergen Reactions   Bee Venom Anaphylaxis and Other (See Comments)    Welts, also    Insect bites- Welts, also   Insect Extract Anaphylaxis and Other (See Comments)    Insect bites- Welts, also   Farxiga  [Dapagliflozin ] Nausea And Vomiting   Lyrica [Pregabalin] Nausea And Vomiting   Neurontin [Gabapentin] Nausea And Vomiting and Other (See Comments)    Hypertension/dizziness   Nsaids Nausea Only and Other (See Comments)    Patient is not suppose to have this class of medication (liver)  Can tolerate 81mg , which she is taking   Latex Rash and Other (See Comments)    Patient Measurements: Height: 5' 7.5 (171.5 cm) Weight: 66.7 kg (147 lb 0.8 oz) IBW/kg (Calculated) : 62.75 HEPARIN  DW (KG): 61.2  Vital Signs: Temp: 98 F (36.7 C) (10/28 1100) Temp Source: Axillary (10/28 1100) BP: 112/57 (10/28 1031) Pulse Rate: 110 (10/28 1031)  Labs: Recent Labs    07/12/24 0609 07/12/24 1558 07/12/24 1752 07/13/24 0310 07/13/24 2332 07/14/24 0339 07/14/24 0844  HGB 8.7* 8.5*  --  7.8*  --  6.8*  --   HCT 28.2* 25.0*  --  25.0*  --  23.0*  --   PLT 383  --   --  324  --  297  --   HEPARINUNFRC  --   --   --   --  0.53  --  0.63  CREATININE 0.51  --   --  0.64  --  0.71  --   TROPONINIHS  --   --  68*  --   --   --   --     Estimated Creatinine Clearance: 60.2 mL/min (by C-G formula based on SCr of 0.71 mg/dL).   Medical History: Past Medical History:  Diagnosis Date   AAA (abdominal aortic aneurysm)    3.4 cm   Anemia    Anxiety    Aortic atherosclerosis    Arthritis    Asthma    CAD (coronary artery disease)    a. Nonobstructive CAD by cath 07/17/13.   Cancer [facility])    Carotid artery disease    Carotid US  8/22: Bilateral ICA 1-39; right subclavian stenosis; right thyroid  nodule   CHD (congenital heart disease)    pt unaware???   Chest pain     a. Adm 10-07/2013 - CTA neg for PE/dissection, cath with nonobstructive disease, CP ?due to uncontrolled HTN vs coronary vasospasm.   CHF (congestive heart failure) (HCC)    Closed head injury with concussion    COPD (chronic obstructive pulmonary disease) (HCC)    Coronary artery disease    Echocardiogram    Echocardiogram 06/2019: EF 60-65, impaired relaxation (Gr 1 DD), mild MAC, mild MR, mild to mod aortic valve sclerosis (no AS), trivial TR, mild LAE, normal RVSF, RVSP 30.7 (mildly elevated)   GERD (gastroesophageal reflux disease)    History of blood transfusion    childbirth, Hysterectomy   Hyperlipidemia    Hypertension    Neuropathy    OA (osteoarthritis)    Osteoporosis    PAD (peripheral artery disease)    a. s/p L fem-pop bypass b. recent evaluation at [facility], unamenable to intervention   PAD (peripheral artery disease)    Palpitations 02/22/2016   Pneumonia    Pre-diabetes    Premature ventricular contraction    PUD (peptic ulcer disease)  PVD (peripheral vascular disease) 07/17/2013   Thoracic aortic aneurysm    Chest/aorta CTA 04/2022: 14 x 8 focal saccular aneurysm versus penetrating atherosclerotic ulcer involving left side of proximal portion of transverse aortic arch; descending thoracic aorta and proximal abdominal aorta aneurysm 4.4 cm; aortic atherosclerosis; emphysema   Tobacco abuse    Wears dentures    Wears glasses     Medications:  Medications Prior to Admission  Medication Sig Dispense Refill Last Dose/Taking   albuterol  (PROVENTIL  HFA;VENTOLIN  HFA) 108 (90 BASE) MCG/ACT inhaler Inhale 2 puffs into the lungs every 6 (six) hours as needed for wheezing or shortness of breath.   06/29/2024 Evening   alendronate  (FOSAMAX ) 70 MG tablet Take 70 mg by mouth every Wednesday.   Past Week   allopurinol  (ZYLOPRIM ) 300 MG tablet Take 300 mg by mouth every evening.   06/29/2024 Noon   aspirin  EC 81 MG tablet Take 81 mg by mouth in the morning.   06/29/2024  Morning   atorvastatin  (LIPITOR ) 80 MG tablet Take 80 mg by mouth at bedtime.   06/29/2024 Morning   calcium  carbonate (ANTACID) 500 MG chewable tablet Chew 1 tablet by mouth 2 (two) times daily as needed for heartburn or indigestion.   Past Week   Cholecalciferol (VITAMIN D ) 125 MCG (5000 UT) CAPS Take 5,000 Units by mouth in the morning.   06/29/2024 Morning   cyanocobalamin  (VITAMIN B12) 1000 MCG tablet Take 1,000 mcg by mouth daily.   06/29/2024 Morning   Dupilumab  (DUPIXENT ) 300 MG/2ML SOAJ Inject 300 mg into the skin every 14 (fourteen) days. 12 mL 1 Past Week   EPIPEN  2-PAK 0.3 MG/0.3ML SOAJ injection Inject 0.3 mg into the muscle daily as needed for anaphylaxis. Use as directed as needed for allergies.   Taking As Needed   ezetimibe  (ZETIA ) 10 MG tablet Take 1 tablet (10 mg total) by mouth daily. 90 tablet 1 06/28/2024 Evening   fluticasone -salmeterol (WIXELA INHUB) 500-50 MCG/ACT AEPB Inhale 1 puff into the lungs in the morning and at bedtime. 3 each 3 06/29/2024 Morning   furosemide  (LASIX ) 40 MG tablet Take one tablet by mouth twice daily on Mondays, Wednesdays, and Fridays, then take one tablet by mouth daily on other days. 120 tablet 2 06/29/2024 Morning   Oxycodone  HCl 10 MG TABS Take 1 tablet (10 mg total) by mouth 4 (four) times daily as needed (for pain). 20 tablet 0 06/29/2024 Morning   pantoprazole  (PROTONIX ) 40 MG tablet Take 40 mg by mouth 2 (two) times daily.   06/29/2024 Morning   potassium chloride  SA (KLOR-CON  M20) 20 MEQ tablet TAKE 1 TABLET BY MOUTH TWICE DAILY ON MONDAYS, WEDNESDAYS, AND FRIDAYS, THEN TAKE ONE TABLET BY MOUTH DAILY ON OTHER DAYS. 120 tablet 2 06/29/2024 Morning   senna-docusate (SENOKOT-S) 8.6-50 MG tablet Take 2 tablets by mouth at bedtime.   06/28/2024 Bedtime   SPIRIVA  RESPIMAT 2.5 MCG/ACT AERS Take 2 puffs by mouth daily.   06/29/2024 Morning   spironolactone  (ALDACTONE ) 25 MG tablet Take 1 tablet (25 mg total) by mouth daily. 90 tablet 3 06/29/2024  Morning   verapamil  (CALAN -SR) 240 MG CR tablet Take 1 tablet (240 mg total) by mouth daily. 90 tablet 1 06/29/2024 Morning   albuterol  (PROVENTIL ) (2.5 MG/3ML) 0.083% nebulizer solution Take 2.5 mg by nebulization every 6 (six) hours as needed for wheezing or shortness of breath. (Patient not taking: Reported on 06/29/2024)   Not Taking   nitroGLYCERIN  (NITROSTAT ) 0.4 MG SL tablet Place 1 tablet (0.4  mg total) under the tongue every 5 (five) minutes x 3 doses as needed for chest pain. (Patient not taking: Reported on 06/29/2024) 25 tablet 4 Not Taking    Assessment: 73 yo F presenting s/p fall with hip fracture and ileus. Patient with new onset afib this admission. Pharmacy consulted for heparin  dosing. Deferred bolus given concern for bowel perforation and slight drop in hemoglobin.  Heparin  level remains therapeutic today at 0.63. Hemoglobin did drop to 6.8, but no signs/symptoms of bleeding or interruptions to the infusion per RN. Plt stable (297).   Goal of Therapy:  Heparin  level 0.3-0.7 units/ml Monitor platelets by anticoagulation protocol: Yes   Plan:  Continue heparin  infusion at 1000 units/hr Check anti-Xa level daily while on heparin  Continue to monitor H&H and platelets  Elma Fail, PharmD PGY1 Clinical Pharmacist [facility]  07/14/2024 11:35 AM

## 2024-07-14 NOTE — Progress Notes (Signed)
 NAME:  Marie Johnson, MRN:  [id number], DOB:  1951-03-25, LOS: 15 ADMISSION DATE:  06/29/2024, CONSULTATION DATE:  07/12/2024 REFERRING MD:  TRH, CHIEF COMPLAINT:   Acute respiratory distress  History of Present Illness:  Marie Johnson is a 73 y.o. female with a past medical history significant for but not limited to CHF, COPD, chronic hypoxic respiratory failure on 2 L nasal cannula at baseline, HTN, HLD, PAD, osteoporosis, and anxiety initially presented to the ED at [facility] 10/13 after suffering mechanical fall resulting in right periprosthetic femur fracture.  Since admission patient has had several complications, see timeline below  Pertinent  Medical History  CHF, COPD, chronic hypoxic respiratory failure on 2 L nasal cannula at baseline, HTN, HLD, PAD, osteoporosis, and anxiety  Significant Hospital Events: Including procedures, antibiotic start and stop dates in addition to other pertinent events   10/13 presented after suffering mechanical fall at home resulting in acute periprosthetic right femur fracture 10/16 abdominal distention noticed, KUB obtained 10/17 colonic ileus confirmed 10/18 GI consulted recommended medical management 10/21 chest x-ray with patchy densities in the right lung concerning for aspiration pneumonia in the setting of ileus 10/23 TPN started via PICC line 10/25 developed worsening respiratory distress with change in mentation transferred to ICU and emergently admitted  Interim History / Subjective:  Overnight had very watery diarrhea. Wakes up agitated.   Objective    Blood pressure (!) 111/59, pulse 82, temperature (!) 97.3 F (36.3 C), temperature source Axillary, resp. rate (!) 24, height 5' 7.5 (1.715 m), weight 66.7 kg, SpO2 100%.    Vent Mode: PRVC FiO2 (%):  [40 %] 40 % Set Rate:  [24 bmp] 24 bmp Vt Set:  [500 mL] 500 mL PEEP:  [5 cmH20] 5 cmH20 Pressure Support:  [8 cmH20] 8 cmH20 Plateau Pressure:  [12 cmH20-15 cmH20] 14 cmH20    Intake/Output Summary (Last 24 hours) at 07/14/2024 0718 Last data filed at 07/14/2024 0601 Gross per 24 hour  Intake 4003.48 ml  Output 1105 ml  Net 2898.48 ml   Filed Weights   06/30/24 1100 07/03/24 2004 07/10/24 0500  Weight: 61.2 kg 61.2 kg 66.7 kg    Examination: General:  critically ill appearing woman lying in bed in NAD HEENT:  Vander/AT, eyes anicteric, ETT in place  Neuro: RASS -5, breathing over the vent CV: S1S2, RRR PULM: breathing comfortably on MV, no significant ETT secretions GI: distended, soft, NT Extremities: no peripheral edema   K+ 4.2 BUN 29 Cr 0.71 WBC 10.7 H/H 6.8/23 Platelets 297 Resp culture: few WBC> rare enterobacter   Resolved problem list   Assessment and Plan  Acute on chronic hypoxic respiratory failure on 2 L nasal cannula at baseline due to history of COPD- -10/26 emergently intubated due to worsening respiratory distress Aspiration pneumonia RLL- Enterobacter -LTVV -VAP prevention protocol -PAD protocol for sedation- adding precedex today, avoiding opiates due to ileus -follow trach aspirate culture until finalized -daily S T& SBT as appropriate; need ileus improved before trial of extubation -7 days of zosyn; can deescalate as culture data allows -con't bronchodilators-- brovana & yupelri  Prolonged ongoing colonic ileus - Developed pretty early on stay given opioid use during acute fracture, immobility.  TPN started 10/23. Per husband, had a history of chronic constipation and used laxatives chronically. -repeat CT w/o transition point -con't bowel regimen per GI recommendations -NGT to LIWS to help relieve abdominal distention that could limit vent weaning -monitor electrolytes and replete as needed -TPN, still  needs PICC  New onset A-fib RVR, due to critical illness. Rate control improved today. -con't amiodarone --heparin  gtt -tele monitoring -monitor electrolytes; replete as needed  Acute periprosthetic right femur  fracture -per 10/4 ortho note, nonoperative management. WBAT. -needs OP follow up with [facility]  Anemia of chronic disease B12 deficiency -transfuse 1 unit pRBC today -if still dropping, may need to stop heparin  gtt -monitor for signs of bleeding  Chronic HFpEF History of CAD-[facility] with mild to moderate nonobstructive disease was previously on DAPT for PAD Hyperlipidemia Essential hypertension PAD -aspirin , statin -hold PTA antihypertensives    Updated husband Marie Johnson via phone today. Called daughter Marie Johnson - unable to leave message.    Labs   CBC: Recent Labs  Lab 07/08/24 0531 07/09/24 0533 07/12/24 0609 07/12/24 1558 07/13/24 0310 07/14/24 0339  WBC 8.9 8.1 10.5  --  9.5 10.7*  NEUTROABS 7.1  --   --   --   --   --   HGB 8.8* 8.6* 8.7* 8.5* 7.8* 6.8*  HCT 28.4* 28.0* 28.2* 25.0* 25.0* 23.0*  MCV 101.4* 100.4* 100.0  --  100.4* 102.7*  PLT 411* 385 383  --  324 297    Basic Metabolic Panel: Recent Labs  Lab 07/10/24 0355 07/11/24 0850 07/12/24 0609 07/12/24 1558 07/13/24 0310 07/13/24 2221 07/14/24 0339  NA 139 134* 137 139 138  --  139  K 3.6 4.0 3.5 3.7 3.2* 4.0 4.2  CL 103 97* 100  --  104  --  108  CO2 29 28 27   --  24  --  23  GLUCOSE 151* 150* 182*  --  191*  --  127*  BUN 14 18 20   --  27*  --  29*  CREATININE 0.65 0.58 0.51  --  0.64  --  0.71  CALCIUM  8.1* 8.2* 8.4*  --  8.3*  --  8.0*  MG 2.0 2.1 1.9  --  2.1 2.2 2.3  PHOS 3.4 3.3 3.6  --  3.4  --  3.7   GFR: Estimated Creatinine Clearance: 60.2 mL/min (by C-G formula based on SCr of 0.71 mg/dL). Recent Labs  Lab 07/09/24 0533 07/12/24 0609 07/12/24 1422 07/12/24 1752 07/13/24 0310 07/14/24 0339  WBC 8.1 10.5  --   --  9.5 10.7*  LATICACIDVEN  --   --  2.5* 1.3  --   --     Liver Function Tests: Recent Labs  Lab 07/08/24 0531 07/09/24 0533 07/10/24 0355 07/11/24 0850 07/12/24 0609  AST 25 20  --   --  19  ALT 13 12  --   --  12  ALKPHOS 58 52   --   --  74  BILITOT 1.0 1.3*  --   --  0.8  PROT 6.0* 6.0*  --   --  6.3*  ALBUMIN  2.4* 2.2* 2.1* 2.3* 2.4*   Critical care time:     This patient is critically ill with multiple organ system failure which requires frequent high complexity decision making, assessment, support, evaluation, and titration of therapies. This was completed through the application of advanced monitoring technologies and extensive interpretation of multiple databases. During this encounter critical care time was devoted to patient care services described in this note for 36 minutes.  Marie SHAUNNA Gaskins, DO 07/14/24 3:29 PM [facility]  For contact information, see Amion. If no response to pager, please call PCCM consult pager. After hours, 7PM- 7AM, please  call Elink.

## 2024-07-14 NOTE — Plan of Care (Signed)
  Problem: Nutrition: Goal: Adequate nutrition will be maintained Outcome: Progressing   Problem: Safety: Goal: Ability to remain free from injury will improve Outcome: Progressing   

## 2024-07-14 NOTE — Progress Notes (Signed)
 Marie Johnson Public 9:50 AM  Subjective: Patient more awake today but unable to communicate and she is having bowel movements no signs of bleeding minimal NG output  Objective: Vital signs stable afebrile no acute distress abdomen is softer and smaller no obvious tenderness potassium 4.2 white count slight increase hemoglobin slight decrease No new x-ray yesterday CT okay Assessment: Seemingly improved from a GI standpoint  Plan: Will check on tomorrow please call sooner if specific question or problem that I can assist with  [facility]  office [phone] After 5PM or if no answer call [phone]

## 2024-07-14 NOTE — Telephone Encounter (Signed)
 Pharmacy Patient Advocate Encounter  Insurance verification completed.    The patient is insured through [facility]. Patient has Medicare and is not eligible for a copay card, but may be able to apply for patient assistance or Medicare RX Payment Plan (Patient Must reach out to their plan, if eligible for payment plan), if available.    Ran test claim for Eliquis 5mg  and the current 30 day co-pay is $47.00.  Ran test claim for Xarelto 20mg  and the current 30 day co-pay is $47.00.   This test claim was processed through [facility]- copay amounts may vary at other pharmacies due to pharmacy/plan contracts, or as the patient moves through the different stages of their insurance plan.

## 2024-07-14 NOTE — Progress Notes (Signed)
 PHARMACY - ANTICOAGULATION CONSULT NOTE  Pharmacy Consult for heparin  Indication: atrial fibrillation  Labs: Recent Labs    07/11/24 0850 07/12/24 0609 07/12/24 0609 07/12/24 1558 07/12/24 1752 07/13/24 0310 07/13/24 2332  HGB  --  8.7*   < > 8.5*  --  7.8*  --   HCT  --  28.2*  --  25.0*  --  25.0*  --   PLT  --  383  --   --   --  324  --   HEPARINUNFRC  --   --   --   --   --   --  0.53  CREATININE 0.58 0.51  --   --   --  0.64  --   TROPONINIHS  --   --   --   --  68*  --   --    < > = values in this interval not displayed.   Assessment/Plan:  73yo female therapeutic on heparin  with initial dosing for new Afib. Will continue infusion at current rate of 1000 units/hr and confirm stable with additional level.  Marvetta Dauphin, PharmD, BCPS 07/14/2024 1:45 AM

## 2024-07-14 NOTE — Progress Notes (Signed)
 PT Cancellation Note  Patient Details Name: Marie Johnson MRN: [id number] DOB: [birth year]   Cancelled Treatment:    Reason Eval/Treat Not Completed: Medical issues which prohibited therapy; on vent and not responding yet.  Will follow up another day.   [name] 07/14/2024, 9:31 AM [name], PT Acute Rehabilitation Services Office:[phone] 07/14/2024

## 2024-07-14 NOTE — Progress Notes (Signed)
 PHARMACY - TOTAL PARENTERAL NUTRITION CONSULT NOTE   Indication: Prolonged ileus  Patient Measurements: Height: 5' 7.5 (171.5 cm) Weight: 66.7 kg (147 lb 0.8 oz) IBW/kg (Calculated) : 62.75 TPN AdjBW (KG): 61.2 Body mass index is 22.69 kg/m. Usual Weight: 140lbs per pt, 135 lbs per chart review   Assessment:  73 yo W admitted with mechanical fall and right femur fracture 10/13 and then found to have ileus on KUB 10/16. Pt was switched to clear liquid diet 10/17-10/20 but vomited what she tried drink. Patient has been NPO since so has had no intake for 7 days. She had N/V and required NG tube placement 10/20 PM with brown particulate output and improvement in N/V. The last meal patient remembers eating is a ham and beef sandwich on 10/13. She normally eats 2-3 meals a day - oatmeal or grits for breakfast (doesn't like eggs), sandwiches and meals from meals on wheels. She is not a big eater but is very hungry as of 10/23 and has liked vanilla ensures in the past. She has been on D5LR since 10/20 PM, receiving 90g dextrose /24hr, which will help decrease refeeding risk. Pharmacy consulted for TPN.   Glucose / Insulin: BG <180, used 5 units SSI/24hr; no hx DM Electrolytes: K 4.2 (s/p K), CoCa 9.6, others wnl Renal: Scr 0.71, BUN 29 Hepatic: Alk phos/AST/AL/Tbili wnl, albumin  2.4, TG 68 Intake / Output; MIVF: UOP 0.6 ml/kg/hr, LBM 10/27 x7  - maintains on Neostigmine q6h, Miralax  to 34g BID GI Imaging: 10/16 KUB: Diffuse bowel gas distension throughout the abdomen, possible ileus  10/17 CT: Gaseous distention of the colon without discrete focal transition point or mass lesion likely representing ileus 10/20 KUB: Persistent gaseous dilatation of the large bowel 10/21 KUB: moderate diffuse bowel distension, consistent with ileus 10/24 KUB:  Gaseous distension of large and small bowel consistent with ileus, slightly improved from prior. 10/27 CTAP: diffusely gas filled dilated colon , few  narrowed segments > ileus  GI Surgeries / Procedures:  10/24 NGT removed   Central access: PICC 10/23  TPN start date: 10/23  Nutritional Goals: Goal TPN rate with full lipids is 70 mL/hr (provides 90 g of protein and 1705 kcals per day) -meeting 100% needs  Goal TPN rate with reduced lipids (25%) is 70 mL/hr (provides 90 g of protein and 1589 kcals per day) -with Propofol  meeting 100% needs  RD Assessment: Estimated Needs Total Energy Estimated Needs: 1700-1900 kcal/d Total Protein Estimated Needs: 80-95g/d Total Fluid Estimated Needs: 1.8L/d  Current Nutrition:  TPN 10/23 CLD 10/24 FLD- had beef broth, cranberry juice, coffee, chocolate pudding, apple sauce, no Ensure 10/25 Soft - patient in a lot of pain, told RN she doesn't like Ensure and refused. Only had soup and ice cream 10/26 Soft - patient willing to try Ensure  10/27 NPO  -propofol  @20 -40 mcg/kg/min > 8-16 ml/hr > 211-422 kcal /day   Plan:  Continue TPN at new goal of 70 mL/hr with reduced lipids at 1800, provides 100% estimated needs with propofol  Electrolytes in TPN: Na 150 mEq/L, K 50 mEq/L, Ca 2 mEq/L, Mg 10 mEq/L, and Phos 10 mmol/L. Cl:Ac 1:1 Per tube MVI and trace elements   Continue sensitive SSI q4h and monitor CBGs Monitor TPN labs Mon/Thurs and PRN  Sharyne Glatter, PharmD, BCCCP Critical Care Clinical Pharmacist 07/14/2024 8:54 AM

## 2024-07-15 DIAGNOSIS — J690 Pneumonitis due to inhalation of food and vomit: Secondary | ICD-10-CM | POA: Diagnosis not present

## 2024-07-15 DIAGNOSIS — J9621 Acute and chronic respiratory failure with hypoxia: Secondary | ICD-10-CM | POA: Diagnosis not present

## 2024-07-15 DIAGNOSIS — J449 Chronic obstructive pulmonary disease, unspecified: Secondary | ICD-10-CM | POA: Diagnosis not present

## 2024-07-15 DIAGNOSIS — K567 Ileus, unspecified: Secondary | ICD-10-CM | POA: Diagnosis not present

## 2024-07-15 LAB — CBC
HCT: 22.4 % — ABNORMAL LOW (ref 36.0–46.0)
HCT: 27.5 % — ABNORMAL LOW (ref 36.0–46.0)
Hemoglobin: 6.8 g/dL — CL (ref 12.0–15.0)
Hemoglobin: 8.5 g/dL — ABNORMAL LOW (ref 12.0–15.0)
MCH: 31.1 pg (ref 26.0–34.0)
MCH: 30.6 pg (ref 26.0–34.0)
MCHC: 30.4 g/dL (ref 30.0–36.0)
MCHC: 30.9 g/dL (ref 30.0–36.0)
MCV: 102.3 fL — ABNORMAL HIGH (ref 80.0–100.0)
MCV: 98.9 fL (ref 80.0–100.0)
Platelets: 295 K/uL (ref 150–400)
Platelets: 317 K/uL (ref 150–400)
RBC: 2.19 MIL/uL — ABNORMAL LOW (ref 3.87–5.11)
RBC: 2.78 MIL/uL — ABNORMAL LOW (ref 3.87–5.11)
RDW: 26.5 % — ABNORMAL HIGH (ref 11.5–15.5)
RDW: 26 % — ABNORMAL HIGH (ref 11.5–15.5)
WBC: 10.5 K/uL (ref 4.0–10.5)
WBC: 12.6 K/uL — ABNORMAL HIGH (ref 4.0–10.5)
nRBC: 1 % — ABNORMAL HIGH (ref 0.0–0.2)
nRBC: 1.2 % — ABNORMAL HIGH (ref 0.0–0.2)

## 2024-07-15 LAB — BASIC METABOLIC PANEL WITH GFR
Anion gap: 10 (ref 5–15)
BUN: 33 mg/dL — ABNORMAL HIGH (ref 8–23)
CO2: 25 mmol/L (ref 22–32)
Calcium: 8 mg/dL — ABNORMAL LOW (ref 8.9–10.3)
Chloride: 104 mmol/L (ref 98–111)
Creatinine, Ser: 0.75 mg/dL (ref 0.44–1.00)
GFR, Estimated: >60 mL/min (ref >60)
Glucose, Bld: 121 mg/dL — ABNORMAL HIGH (ref 70–99)
Potassium: 4.8 mmol/L (ref 3.5–5.1)
Sodium: 139 mmol/L (ref 135–145)

## 2024-07-15 LAB — HEPARIN LEVEL (UNFRACTIONATED): Heparin Unfractionated: 0.51 [IU]/mL (ref 0.30–0.70)

## 2024-07-15 LAB — MAGNESIUM: Magnesium: 2.6 mg/dL — ABNORMAL HIGH (ref 1.7–2.4)

## 2024-07-15 LAB — GLUCOSE, CAPILLARY
Glucose-Capillary: 128 mg/dL — ABNORMAL HIGH (ref 70–99)
Glucose-Capillary: 140 mg/dL — ABNORMAL HIGH (ref 70–99)
Glucose-Capillary: 143 mg/dL — ABNORMAL HIGH (ref 70–99)
Glucose-Capillary: 166 mg/dL — ABNORMAL HIGH (ref 70–99)
Glucose-Capillary: 172 mg/dL — ABNORMAL HIGH (ref 70–99)
Glucose-Capillary: 125 mg/dL — ABNORMAL HIGH (ref 70–99)

## 2024-07-15 LAB — PHOSPHORUS: Phosphorus: 3.7 mg/dL (ref 2.5–4.6)

## 2024-07-15 LAB — TRIGLYCERIDES: Triglycerides: 52 mg/dL (ref <150)

## 2024-07-15 MED ORDER — HYDRALAZINE HCL 20 MG/ML IJ SOLN
5.0000 mg | Freq: Four times a day (QID) | INTRAMUSCULAR | Status: DC | PRN
  Administered 2024-07-15: 5 mg via INTRAVENOUS
  Filled 2024-07-15 (×2): qty 1

## 2024-07-15 MED ORDER — FENTANYL 2500MCG IN NS 250ML (10MCG/ML) PREMIX INFUSION
0.0000 ug/h | INTRAVENOUS | Status: DC
  Administered 2024-07-15: 50 ug/h via INTRAVENOUS
  Filled 2024-07-15: qty 250

## 2024-07-15 MED ORDER — TRAVASOL 10 % IV SOLN
INTRAVENOUS | Status: AC
  Filled 2024-07-15: qty 907.2

## 2024-07-15 MED ORDER — FENTANYL BOLUS VIA INFUSION
50.0000 ug | INTRAVENOUS | Status: DC | PRN
  Administered 2024-07-16 (×2): 50 ug via INTRAVENOUS

## 2024-07-15 MED ORDER — SODIUM CHLORIDE 3 % IN NEBU
4.0000 mL | INHALATION_SOLUTION | RESPIRATORY_TRACT | Status: AC
  Administered 2024-07-15 – 2024-07-18 (×13): 4 mL via RESPIRATORY_TRACT
  Filled 2024-07-15: qty 15
  Filled 2024-07-15: qty 4
  Filled 2024-07-15 (×4): qty 15
  Filled 2024-07-15: qty 4
  Filled 2024-07-15 (×7): qty 15

## 2024-07-15 NOTE — Progress Notes (Addendum)
 PHARMACY - TOTAL PARENTERAL NUTRITION CONSULT NOTE   Indication: Prolonged ileus  Patient Measurements: Height: 5' 7.5 (171.5 cm) Weight: 66.7 kg (147 lb 0.8 oz) IBW/kg (Calculated) : 62.75 TPN AdjBW (KG): 61.2 Body mass index is 22.69 kg/m. Usual Weight: 140lbs per pt, 135 lbs per chart review   Assessment:  73 yo W admitted with mechanical fall and right femur fracture 10/13 and then found to have ileus on KUB 10/16. Pt was switched to clear liquid diet 10/17-10/20 but vomited what she tried drink. Patient has been NPO since so has had no intake for 7 days. She had N/V and required NG tube placement 10/20 PM with brown particulate output and improvement in N/V. The last meal patient remembers eating is a ham and beef sandwich on 10/13. She normally eats 2-3 meals a day - oatmeal or grits for breakfast (doesn't like eggs), sandwiches and meals from meals on wheels. She is not a big eater but is very hungry as of 10/23 and has liked vanilla ensures in the past. She has been on D5LR since 10/20 PM, receiving 90g dextrose /24hr, which will help decrease refeeding risk. Pharmacy consulted for TPN.   Glucose / Insulin: BG <180, used 5 units SSI/24hr; no hx DM Electrolytes: K 4.8, CoCa 9.6, others wnl Renal: Scr 0.75, BUN 33 Hepatic: Alk phos/AST/AL/Tbili wnl, albumin  2.4, TG 52 Intake / Output; MIVF: UOP 0.4 ml/kg/hr, NGT output 350 mL, LBM 10/28 x1  - maintains on Neostigmine q6h, bisacodyl  BID, Miralax  to 34g BID GI Imaging: 10/16 KUB: Diffuse bowel gas distension throughout the abdomen, possible ileus  10/17 CT: Gaseous distention of the colon without discrete focal transition point or mass lesion likely representing ileus 10/20 KUB: Persistent gaseous dilatation of the large bowel 10/21 KUB: moderate diffuse bowel distension, consistent with ileus 10/24 KUB:  Gaseous distension of large and small bowel consistent with ileus, slightly improved from prior. 10/27 CTAP: diffusely gas filled  dilated colon , few narrowed segments > ileus  GI Surgeries / Procedures:  10/24 NGT removed   Central access: PICC 10/23  TPN start date: 10/23  Nutritional Goals: Goal TPN rate with full lipids is 70 mL/hr (provides 90 g of protein and 1705 kcals per day) -meeting 100% needs  Goal TPN rate with reduced lipids (25%) is 70 mL/hr (provides 90 g of protein and 1589 kcals per day) -with Propofol  meeting 100% needs  RD Assessment: Estimated Needs Total Energy Estimated Needs: 1700-1900 kcal/d Total Protein Estimated Needs: 80-95g/d Total Fluid Estimated Needs: 1.8L/d  Current Nutrition:  TPN 10/23 CLD 10/24 FLD- had beef broth, cranberry juice, coffee, chocolate pudding, apple sauce, no Ensure 10/25 Soft - patient in a lot of pain, told RN she doesn't like Ensure and refused. Only had soup and ice cream 10/26 Soft - patient willing to try Ensure  10/27-28 NPO -propofol  @20 -40 mcg/kg/min > 8-16 ml/hr > 211-422 kcal /day  10/29 NPO - propofol  @ 20 mcg/kg/min > 8 ml/hr (keeping on)  Plan:  Continue TPN at new goal of 70 mL/hr with reduced lipids at 1800, provides 100% estimated needs with propofol  Electrolytes in TPN: Na 140 mEq/L, K 36 mEq/L, Ca 2 mEq/L, Mg 5 mEq/L, and Phos 10 mmol/L. Cl:Ac 1:1 Per tube MVI and trace elements   Continue sensitive SSI q4h and monitor CBGs Monitor TPN labs Mon/Thurs and PRN  Sharyne Glatter, PharmD, BCCCP Critical Care Clinical Pharmacist 07/15/2024 8:42 AM

## 2024-07-15 NOTE — Progress Notes (Signed)
 PHARMACY - ANTICOAGULATION CONSULT NOTE  Pharmacy Consult for IV heparin   Indication: atrial fibrillation  Allergies  Allergen Reactions   Bee Venom Anaphylaxis and Other (See Comments)    Welts, also    Insect bites- Welts, also   Insect Extract Anaphylaxis and Other (See Comments)    Insect bites- Welts, also   Farxiga  [Dapagliflozin ] Nausea And Vomiting   Lyrica [Pregabalin] Nausea And Vomiting   Neurontin [Gabapentin] Nausea And Vomiting and Other (See Comments)    Hypertension/dizziness   Nsaids Nausea Only and Other (See Comments)    Patient is not suppose to have this class of medication (liver)  Can tolerate 81mg , which she is taking   Latex Rash and Other (See Comments)    Patient Measurements: Height: 5' 7.5 (171.5 cm) Weight: 66.7 kg (147 lb 0.8 oz) IBW/kg (Calculated) : 62.75 HEPARIN  DW (KG): 61.2  Vital Signs: Temp: 98.1 F (36.7 C) (10/29 0800) Temp Source: Axillary (10/29 0800) BP: 103/54 (10/29 0645) Pulse Rate: 76 (10/29 0743)  Labs: Recent Labs    07/12/24 1752 07/13/24 0310 07/13/24 2332 07/14/24 0339 07/14/24 0844 07/15/24 0219  HGB  --  7.8*  --  6.8*  --  6.8*  HCT  --  25.0*  --  23.0*  --  22.4*  PLT  --  324  --  297  --  295  HEPARINUNFRC  --   --  0.53  --  0.63 0.51  CREATININE  --  0.64  --  0.71  --  0.75  TROPONINIHS 68*  --   --   --   --   --     Estimated Creatinine Clearance: 60.2 mL/min (by C-G formula based on SCr of 0.75 mg/dL).   Medical History: Past Medical History:  Diagnosis Date   AAA (abdominal aortic aneurysm)    3.4 cm   Anemia    Anxiety    Aortic atherosclerosis    Arthritis    Asthma    CAD (coronary artery disease)    a. Nonobstructive CAD by cath 07/17/13.   Cancer [facility])    Carotid artery disease    Carotid US  8/22: Bilateral ICA 1-39; right subclavian stenosis; right thyroid  nodule   CHD (congenital heart disease)    pt unaware???   Chest pain    a. Adm 10-07/2013 - CTA neg for  PE/dissection, cath with nonobstructive disease, CP ?due to uncontrolled HTN vs coronary vasospasm.   CHF (congestive heart failure) (HCC)    Closed head injury with concussion    COPD (chronic obstructive pulmonary disease) (HCC)    Coronary artery disease    Echocardiogram    Echocardiogram 06/2019: EF 60-65, impaired relaxation (Gr 1 DD), mild MAC, mild MR, mild to mod aortic valve sclerosis (no AS), trivial TR, mild LAE, normal RVSF, RVSP 30.7 (mildly elevated)   GERD (gastroesophageal reflux disease)    History of blood transfusion    childbirth, Hysterectomy   Hyperlipidemia    Hypertension    Neuropathy    OA (osteoarthritis)    Osteoporosis    PAD (peripheral artery disease)    a. s/p L fem-pop bypass b. recent evaluation at [facility], unamenable to intervention   PAD (peripheral artery disease)    Palpitations 02/22/2016   Pneumonia    Pre-diabetes    Premature ventricular contraction    PUD (peptic ulcer disease)    PVD (peripheral vascular disease) 07/17/2013   Thoracic aortic aneurysm    Chest/aorta CTA 04/2022: 14 x  8 focal saccular aneurysm versus penetrating atherosclerotic ulcer involving left side of proximal portion of transverse aortic arch; descending thoracic aorta and proximal abdominal aorta aneurysm 4.4 cm; aortic atherosclerosis; emphysema   Tobacco abuse    Wears dentures    Wears glasses     Medications:  Medications Prior to Admission  Medication Sig Dispense Refill Last Dose/Taking   albuterol  (PROVENTIL  HFA;VENTOLIN  HFA) 108 (90 BASE) MCG/ACT inhaler Inhale 2 puffs into the lungs every 6 (six) hours as needed for wheezing or shortness of breath.   06/29/2024 Evening   alendronate  (FOSAMAX ) 70 MG tablet Take 70 mg by mouth every Wednesday.   Past Week   allopurinol  (ZYLOPRIM ) 300 MG tablet Take 300 mg by mouth every evening.   06/29/2024 Noon   aspirin  EC 81 MG tablet Take 81 mg by mouth in the morning.   06/29/2024 Morning   atorvastatin  (LIPITOR ) 80 MG  tablet Take 80 mg by mouth at bedtime.   06/29/2024 Morning   calcium  carbonate (ANTACID) 500 MG chewable tablet Chew 1 tablet by mouth 2 (two) times daily as needed for heartburn or indigestion.   Past Week   Cholecalciferol (VITAMIN D ) 125 MCG (5000 UT) CAPS Take 5,000 Units by mouth in the morning.   06/29/2024 Morning   cyanocobalamin  (VITAMIN B12) 1000 MCG tablet Take 1,000 mcg by mouth daily.   06/29/2024 Morning   Dupilumab  (DUPIXENT ) 300 MG/2ML SOAJ Inject 300 mg into the skin every 14 (fourteen) days. 12 mL 1 Past Week   EPIPEN  2-PAK 0.3 MG/0.3ML SOAJ injection Inject 0.3 mg into the muscle daily as needed for anaphylaxis. Use as directed as needed for allergies.   Taking As Needed   ezetimibe  (ZETIA ) 10 MG tablet Take 1 tablet (10 mg total) by mouth daily. 90 tablet 1 06/28/2024 Evening   fluticasone -salmeterol (WIXELA INHUB) 500-50 MCG/ACT AEPB Inhale 1 puff into the lungs in the morning and at bedtime. 3 each 3 06/29/2024 Morning   furosemide  (LASIX ) 40 MG tablet Take one tablet by mouth twice daily on Mondays, Wednesdays, and Fridays, then take one tablet by mouth daily on other days. 120 tablet 2 06/29/2024 Morning   Oxycodone  HCl 10 MG TABS Take 1 tablet (10 mg total) by mouth 4 (four) times daily as needed (for pain). 20 tablet 0 06/29/2024 Morning   pantoprazole  (PROTONIX ) 40 MG tablet Take 40 mg by mouth 2 (two) times daily.   06/29/2024 Morning   potassium chloride  SA (KLOR-CON  M20) 20 MEQ tablet TAKE 1 TABLET BY MOUTH TWICE DAILY ON MONDAYS, WEDNESDAYS, AND FRIDAYS, THEN TAKE ONE TABLET BY MOUTH DAILY ON OTHER DAYS. 120 tablet 2 06/29/2024 Morning   senna-docusate (SENOKOT-S) 8.6-50 MG tablet Take 2 tablets by mouth at bedtime.   06/28/2024 Bedtime   SPIRIVA  RESPIMAT 2.5 MCG/ACT AERS Take 2 puffs by mouth daily.   06/29/2024 Morning   spironolactone  (ALDACTONE ) 25 MG tablet Take 1 tablet (25 mg total) by mouth daily. 90 tablet 3 06/29/2024 Morning   verapamil  (CALAN -SR) 240 MG CR  tablet Take 1 tablet (240 mg total) by mouth daily. 90 tablet 1 06/29/2024 Morning   albuterol  (PROVENTIL ) (2.5 MG/3ML) 0.083% nebulizer solution Take 2.5 mg by nebulization every 6 (six) hours as needed for wheezing or shortness of breath. (Patient not taking: Reported on 06/29/2024)   Not Taking   nitroGLYCERIN  (NITROSTAT ) 0.4 MG SL tablet Place 1 tablet (0.4 mg total) under the tongue every 5 (five) minutes x 3 doses as needed for chest pain. (Patient  not taking: Reported on 06/29/2024) 25 tablet 4 Not Taking    Assessment: 73 yo F presenting s/p fall with hip fracture and ileus. Patient with new onset afib this admission. Pharmacy consulted for heparin  dosing. Deferred bolus given concern for bowel perforation and slight drop in hemoglobin.  Heparin  level remains therapeutic today at 0.51. Hemoglobin did drop to 6.8, but no signs/symptoms of bleeding or interruptions to the infusion per RN. Plt stable (295). Will continue for now, but will discuss stopping heparin  with CCM team given Hgb drops requiring transfusion.   Goal of Therapy:  Heparin  level 0.3-0.7 units/ml Monitor platelets by anticoagulation protocol: Yes   Plan:  Continue heparin  infusion at 1000 units/hr Check anti-Xa level daily while on heparin  Continue to monitor H&H and platelets  Marie Johnson, PharmD PGY1 Clinical Pharmacist [facility]  07/15/2024 8:50 AM

## 2024-07-15 NOTE — Progress Notes (Signed)
 Desaturated before extubation with RT in room-- extubation held this morning.  Start CPT, 3% saline nebs

## 2024-07-15 NOTE — Progress Notes (Signed)
 1 unit of PRBC finished for Hgb 6.8. Post cbc to be drawn at 0715. Patient tolerated infusion well. VSS. Afebrile.

## 2024-07-15 NOTE — Progress Notes (Signed)
 NAME:  Marie Johnson, MRN:  [id number], DOB:  [birth year], LOS: 16 ADMISSION DATE:  06/29/2024, CONSULTATION DATE:  07/12/2024 REFERRING MD:  TRH, CHIEF COMPLAINT:   Acute respiratory distress  History of Present Illness:  Marie Johnson is a 73 y.o. female with a past medical history significant for but not limited to CHF, COPD, chronic hypoxic respiratory failure on 2 L nasal cannula at baseline, HTN, HLD, PAD, osteoporosis, and anxiety initially presented to the ED at [facility] 10/13 after suffering mechanical fall resulting in right periprosthetic femur fracture.  Since admission patient has had several complications, see timeline below  Pertinent  Medical History  CHF, COPD, chronic hypoxic respiratory failure on 2 L nasal cannula at baseline, HTN, HLD, PAD, osteoporosis, and anxiety  Significant Hospital Events: Including procedures, antibiotic start and stop dates in addition to other pertinent events   10/13 presented after suffering mechanical fall at home resulting in acute periprosthetic right femur fracture 10/16 abdominal distention noticed, KUB obtained 10/17 colonic ileus confirmed 10/18 GI consulted recommended medical management 10/21 chest x-ray with patchy densities in the right lung concerning for aspiration pneumonia in the setting of ileus 10/23 TPN started via PICC line 10/25 developed worsening respiratory distress with change in mentation transferred to ICU and emergently admitted  Interim History / Subjective:  This morning [name] endorses some discomfort from her ETT. She had a large BM yesterday.  Did not get PRBCs that were ordered yesterday.  Objective    Blood pressure (!) 103/54, pulse 76, temperature 97.6 F (36.4 C), temperature source Axillary, resp. rate (!) 22, height 5' 7.5 (1.715 m), weight 66.7 kg, SpO2 100%.    Vent Mode: PRVC FiO2 (%):  [40 %-50 %] 50 % Set Rate:  [24 bmp] 24 bmp Vt Set:  [500 mL] 500 mL PEEP:  [5 cmH20] 5  cmH20 Pressure Support:  [5 cmH20-10 cmH20] 5 cmH20 Plateau Pressure:  [11 cmH20-18 cmH20] 18 cmH20   Intake/Output Summary (Last 24 hours) at 07/15/2024 0719 Last data filed at 07/15/2024 0600 Gross per 24 hour  Intake 3020.75 ml  Output 1020 ml  Net 2000.75 ml   Filed Weights   06/30/24 1100 07/03/24 2004 07/10/24 0500  Weight: 61.2 kg 61.2 kg 66.7 kg    Examination: General: Critically ill-appearing woman lying bed no acute distress, intubated, lightly sedated HEENT: [place]/AT, eyes anicteric Neuro: RASS -1, comprehension intact, able to nod yes and no CV: S1-S2, irregular rhythm, regular rate PULM: Breathing comfortably on mechanical ventilation, mild tan thick secretions from endotracheal tube GI: Still distended, hypoactive bowel sounds, soft, nontender.  Bilious output from OG tube Extremities: No significant peripheral edema   K+ 4.8 BUN 33 Cr 0.75 WBC 10.5 H/H 6.8/22.4 Platelets 295 Resp culture: few WBC> enterobacter cloacae   Resolved problem list   Assessment and Plan  Acute on chronic hypoxic respiratory failure on 2 L nasal cannula at baseline due to history of COPD- -10/26 emergently intubated due to worsening respiratory distress Aspiration pneumonia RLL- Enterobacter - LTVV - VAP prevention protocol - PAD protocol for sedation-goal RASS 0 to -1 - Daily SAT and SBT as appropriate.  Initially passed SBT but just prior to extubation had significant desaturation episode with distress.  Back on full support. - Add chest PT and hypertonic saline nebs -Completed 7 days of Zosyn (day #4) -Continue bronchodilators  Prolonged ongoing colonic ileus - Developed pretty early on stay given opioid use during acute fracture, immobility.  TPN started 10/23.  Per husband, had a history of chronic constipation and used laxatives chronically. - continue bowel regimen; stopping neostigmine due to lack of perceived benefit.  Can try erythromycin again at some point -  Appreciate GI's management - Avoid all opiates - OG tube to low intermittent wall suction.  Would need NG tube prior to extubation -Continue TPN, continues to require PICC line -Monitor electrolytes, replete as needed - Would like to progress mobility is much as possible to help with ileus, but unfortunately hip fracture also limits this.  New onset A-fib RVR, due to critical illness. Rate control improved today. -Continue control amiodarone - Continue heparin  infusion - Telemetry monitoring - Monitor electrolytes replete as needed  Acute periprosthetic right femur fracture -per 10/4 ortho note, nonoperative management. WBAT. -needs OP follow up with [facility] -Would prefer nonopioid pain control  Anemia of chronic disease; although hemoglobin was low again today this was same without getting blood products in between.  Very low suspicion for active bleeding. B12 deficiency -Transfuse 1 unit PRBCs today-verify that these will be given.  Appropriate correction and follow-up CBC. -Okay to continue heparin  for now -Monitor for signs of bleeding  Chronic HFpEF History of CAD-Status post [facility] July 2024 with mild to moderate nonobstructive disease was previously on DAPT for PAD Hyperlipidemia Essential hypertension PAD -Continue aspirin  and statin. - Holding PTA antihypertensives.    Labs   CBC: Recent Labs  Lab 07/09/24 0533 07/12/24 9390 07/12/24 1558 07/13/24 0310 07/14/24 0339 07/15/24 0219  WBC 8.1 10.5  --  9.5 10.7* 10.5  HGB 8.6* 8.7* 8.5* 7.8* 6.8* 6.8*  HCT 28.0* 28.2* 25.0* 25.0* 23.0* 22.4*  MCV 100.4* 100.0  --  100.4* 102.7* 102.3*  PLT 385 383  --  324 297 295    Basic Metabolic Panel: Recent Labs  Lab 07/11/24 0850 07/12/24 0609 07/12/24 1558 07/13/24 0310 07/13/24 2221 07/14/24 0339 07/15/24 0219  NA 134* 137 139 138  --  139 139  K 4.0 3.5 3.7 3.2* 4.0 4.2 4.8  CL 97* 100  --  104  --  108 104  CO2 28 27  --  24  --  23 25  GLUCOSE 150*  182*  --  191*  --  127* 121*  BUN 18 20  --  27*  --  29* 33*  CREATININE 0.58 0.51  --  0.64  --  0.71 0.75  CALCIUM  8.2* 8.4*  --  8.3*  --  8.0* 8.0*  MG 2.1 1.9  --  2.1 2.2 2.3 2.6*  PHOS 3.3 3.6  --  3.4  --  3.7 3.7   GFR: Estimated Creatinine Clearance: 60.2 mL/min (by C-G formula based on SCr of 0.75 mg/dL). Recent Labs  Lab 07/12/24 0609 07/12/24 1422 07/12/24 1752 07/13/24 0310 07/14/24 0339 07/15/24 0219  WBC 10.5  --   --  9.5 10.7* 10.5  LATICACIDVEN  --  2.5* 1.3  --   --   --     Liver Function Tests: Recent Labs  Lab 07/09/24 0533 07/10/24 0355 07/11/24 0850 07/12/24 0609  AST 20  --   --  19  ALT 12  --   --  12  ALKPHOS 52  --   --  74  BILITOT 1.3*  --   --  0.8  PROT 6.0*  --   --  6.3*  ALBUMIN  2.2* 2.1* 2.3* 2.4*   Critical care time:     This patient is critically ill with  multiple organ system failure which requires frequent high complexity decision making, assessment, support, evaluation, and titration of therapies. This was completed through the application of advanced monitoring technologies and extensive interpretation of multiple databases. During this encounter critical care time was devoted to patient care services described in this note for 40 minutes.  Leita SHAUNNA Gaskins, DO 07/15/24 1:46 PM [facility]  For contact information, see Amion. If no response to pager, please call PCCM consult pager. After hours, 7PM- 7AM, please call Elink.

## 2024-07-15 NOTE — Progress Notes (Signed)
 Marie Johnson Public 10:10 AM  Subjective: Patient seen and examined and case discussed with the patient's nurse and she has minimal out her NG is moving her bowels no signs of obvious bleeding and no obvious abdominal pain  Objective: Increase breathing problems other vital signs stable abdomen is slightly more distended than yesterday although soft nontender hemoglobin unchanged given 1 unit of blood BUN/creatinine and potassium all okay  Assessment: Colonic ileus  Plan: Please call me this week if I can be of any further assistance from a GI standpoint or if significant question or problem [facility]  office [phone] After 5PM or if no answer call [phone]

## 2024-07-15 NOTE — Plan of Care (Signed)
   Problem: Education: Goal: Knowledge of General Education information will improve Description: Including pain rating scale, medication(s)/side effects and non-pharmacologic comfort measures Outcome: Progressing   Problem: Health Behavior/Discharge Planning: Goal: Ability to manage health-related needs will improve Outcome: Progressing   Problem: Clinical Measurements: Goal: Ability to maintain clinical measurements within normal limits will improve Outcome: Progressing Goal: Will remain free from infection Outcome: Progressing Goal: Diagnostic test results will improve Outcome: Progressing Goal: Respiratory complications will improve Outcome: Progressing Goal: Cardiovascular complication will be avoided Outcome: Progressing   Problem: Elimination: Goal: Will not experience complications related to bowel motility Outcome: Progressing Goal: Will not experience complications related to urinary retention Outcome: Progressing   Problem: Pain Managment: Goal: General experience of comfort will improve and/or be controlled Outcome: Progressing

## 2024-07-15 NOTE — Progress Notes (Signed)
 PT Cancellation Note  Patient Details Name: Marie Johnson MRN: [id number] DOB: [birth year]   Cancelled Treatment:    Reason Eval/Treat Not Completed: (P) Patient not medically ready. RN requesting hold on PT this date. Pt currently sedated and intubated. Will plan to follow-up another day as able.   Theo Ferretti, PT, DPT Acute Rehabilitation Services  Office: [phone]    Theo CHRISTELLA Ferretti 07/15/2024, 1:30 PM

## 2024-07-15 NOTE — Progress Notes (Signed)
 OT Cancellation Note  Patient Details Name: Marie Johnson MRN: [id number] DOB: May 30, 1951   Cancelled Treatment:    Reason Eval/Treat Not Completed: Patient not medically ready (RN requesting hold on OT this date. Pt currently sedated and intubated. Will plan to follow-up another day as appropriate/available.)  [name], OTR/L, MA Acute Rehab [phone]   Margarie FORBES Horns 07/15/2024, 2:07 PM

## 2024-07-16 ENCOUNTER — Ambulatory Visit: Admitting: Pulmonary Disease

## 2024-07-16 ENCOUNTER — Inpatient Hospital Stay (HOSPITAL_COMMUNITY)

## 2024-07-16 DIAGNOSIS — J9621 Acute and chronic respiratory failure with hypoxia: Secondary | ICD-10-CM | POA: Diagnosis not present

## 2024-07-16 DIAGNOSIS — J690 Pneumonitis due to inhalation of food and vomit: Secondary | ICD-10-CM | POA: Diagnosis not present

## 2024-07-16 DIAGNOSIS — D72829 Elevated white blood cell count, unspecified: Secondary | ICD-10-CM

## 2024-07-16 DIAGNOSIS — K567 Ileus, unspecified: Secondary | ICD-10-CM | POA: Diagnosis not present

## 2024-07-16 DIAGNOSIS — J449 Chronic obstructive pulmonary disease, unspecified: Secondary | ICD-10-CM | POA: Diagnosis not present

## 2024-07-16 LAB — HEPARIN LEVEL (UNFRACTIONATED)
Heparin Unfractionated: 0.32 [IU]/mL (ref 0.30–0.70)
Heparin Unfractionated: 0.36 [IU]/mL (ref 0.30–0.70)

## 2024-07-16 LAB — BPAM RBC
Blood Product Expiration Date: 202511182359
ISSUE DATE / TIME: 202510290327
Unit Type and Rh: 202511182359
Unit Type and Rh: 6200

## 2024-07-16 LAB — GLUCOSE, CAPILLARY
Glucose-Capillary: 150 mg/dL — ABNORMAL HIGH (ref 70–99)
Glucose-Capillary: 157 mg/dL — ABNORMAL HIGH (ref 70–99)
Glucose-Capillary: 135 mg/dL — ABNORMAL HIGH (ref 70–99)
Glucose-Capillary: 132 mg/dL — ABNORMAL HIGH (ref 70–99)
Glucose-Capillary: 130 mg/dL — ABNORMAL HIGH (ref 70–99)
Glucose-Capillary: 108 mg/dL — ABNORMAL HIGH (ref 70–99)

## 2024-07-16 LAB — CBC
HCT: 26.6 % — ABNORMAL LOW (ref 36.0–46.0)
Hemoglobin: 8.4 g/dL — ABNORMAL LOW (ref 12.0–15.0)
MCH: 31.2 pg (ref 26.0–34.0)
MCHC: 31.6 g/dL (ref 30.0–36.0)
MCV: 98.9 fL (ref 80.0–100.0)
Platelets: 330 K/uL (ref 150–400)
RBC: 2.69 MIL/uL — ABNORMAL LOW (ref 3.87–5.11)
RDW: 26.5 % — ABNORMAL HIGH (ref 11.5–15.5)
WBC: 18.3 K/uL — ABNORMAL HIGH (ref 4.0–10.5)
nRBC: 0.3 % — ABNORMAL HIGH (ref 0.0–0.2)

## 2024-07-16 LAB — TYPE AND SCREEN
ABO/RH(D): A POS
Antibody Screen: NEGATIVE
Unit division: 0

## 2024-07-16 LAB — BASIC METABOLIC PANEL WITH GFR
Anion gap: 11 (ref 5–15)
BUN: 35 mg/dL — ABNORMAL HIGH (ref 8–23)
CO2: 25 mmol/L (ref 22–32)
Calcium: 7.8 mg/dL — ABNORMAL LOW (ref 8.9–10.3)
Chloride: 108 mmol/L (ref 98–111)
Creatinine, Ser: 0.67 mg/dL (ref 0.44–1.00)
GFR, Estimated: >60 mL/min (ref >60)
Glucose, Bld: 144 mg/dL — ABNORMAL HIGH (ref 70–99)
Potassium: 4 mmol/L (ref 3.5–5.1)
Sodium: 144 mmol/L (ref 135–145)

## 2024-07-16 LAB — MAGNESIUM: Magnesium: 2.4 mg/dL (ref 1.7–2.4)

## 2024-07-16 LAB — TRIGLYCERIDES: Triglycerides: 48 mg/dL (ref <150)

## 2024-07-16 LAB — PHOSPHORUS: Phosphorus: 3.4 mg/dL (ref 2.5–4.6)

## 2024-07-16 MED ORDER — FUROSEMIDE 10 MG/ML IJ SOLN
60.0000 mg | Freq: Three times a day (TID) | INTRAMUSCULAR | Status: AC
  Administered 2024-07-16 (×2): 60 mg via INTRAVENOUS
  Filled 2024-07-16 (×2): qty 6

## 2024-07-16 MED ORDER — POTASSIUM CHLORIDE 20 MEQ PO PACK
40.0000 meq | PACK | Freq: Once | ORAL | Status: AC
  Administered 2024-07-16: 40 meq
  Filled 2024-07-16: qty 2

## 2024-07-16 MED ORDER — AMIODARONE HCL 200 MG PO TABS
400.0000 mg | ORAL_TABLET | Freq: Two times a day (BID) | ORAL | Status: DC
  Administered 2024-07-16 – 2024-07-20 (×9): 400 mg
  Filled 2024-07-16 (×9): qty 2

## 2024-07-16 MED ORDER — METHYLNALTREXONE BROMIDE 12 MG/0.6ML ~~LOC~~ SOLN
12.0000 mg | Freq: Once | SUBCUTANEOUS | Status: AC
  Administered 2024-07-16: 12 mg via SUBCUTANEOUS
  Filled 2024-07-16: qty 0.6

## 2024-07-16 MED ORDER — TRAVASOL 10 % IV SOLN
INTRAVENOUS | Status: AC
  Filled 2024-07-16: qty 907.2

## 2024-07-16 NOTE — Progress Notes (Signed)
 Nutrition Follow-up  DOCUMENTATION CODES:  Non-severe (moderate) malnutrition in context of chronic illness  INTERVENTION:  Continue TPN Nutrition recommendations are below Management per pharmacy team Monitor for ability to trial enteral feeds. If able to initiate, recommend the following: Osmolite 1.5 at 45 ml/h (1080 ml per day) Start at 15 and advance by 10mL every 8 hours to reach goal Prosource TF20 60 ml 1x/d Provides 1700 kcal, 88 gm protein, 823 ml free water daily  NUTRITION DIAGNOSIS:  Moderate Malnutrition related to chronic illness as evidenced by energy intake < 75% for > or equal to 1 month, mild fat depletion, moderate muscle depletion. - remains applicable   GOAL:  Patient will meet greater than or equal to 90% of their needs - progressing  MONITOR:  PO intake, Supplement acceptance, Weight trends  REASON FOR ASSESSMENT:  Consult New TPN/TNA  ASSESSMENT:  Pt with hx of GERD, HTN, HLD, PAD, CAD, COPD (on home O2), CHF, hx of laryngeal cancer presented to ED after a fall at home. Imaging showed a periprosthetic hip fx.Ortho consulted and planned for non-operative management.  10/13 - presented to ED after a fall 10/19 - NGT placed for decompression 10/23 - PICC placed, TPN initiated 10/24 - NGT removed 10/26 - respiratory distress, transferred to ICU, intubated   Pt resting in bed at the time of assessment. Remains intubated, no family present at bedside. TPN continues at this time as pt's source of nutrition.   Pt discussed during ICU rounds and with RN and MD. Attempted to extubate yesterday but pt unable to maintain oxygen  saturations. Pt with increased vent requirements today, unless significant improvement will not be able to extubate.   GI has signed off. Discussed the possibility of trickles with MD. Prefer output yesterday from OGT charted at x 24 hours, prefers to wait until output has decreased a little more. Enteral recommendations are above  when ready to be utilized. For now, will continue TPN.  MV: 15.5 L/min Temp (24hrs), Avg:98.1 F (36.7 C), Min:97.8 F (36.6 C), Max:98.5 F (36.9 C) MAP (cuff): 75-98 mmHg  Propofol : 4 ml/hr (106 kcal/d)  Admit weight: 61.2 kg   Current weight: 62.5 kg  Noted pt with pitting edema to the BLE and BUE 10/30   Intake/Output Summary (Last 24 hours) at 07/16/2024 0912 Last data filed at 07/16/2024 0800 Gross per 24 hour  Intake 3377.04 ml  Output 1355 ml  Net 2022.04 ml  Net IO Since Admission: 8,934.77 mL [07/16/24 0912]  Drains/Lines: PICC double lumen OGT 16 Fr. x 24 hours UOP x 24 hours  Average Meal Intake: 10/14-10/19: 73% intake x 6 recorded meals  Nutritionally Relevant Medications: Scheduled Meds:  bisacodyl   10 mg Rectal BID   docusate  100 mg Per Tube BID   furosemide   60 mg Intravenous Q8H   insulin aspart  0-9 Units Subcutaneous Q4H   methylnaltrexone  12 mg Subcutaneous Once   multivitamin with minerals  1 tablet Per Tube Daily   pantoprazole  IV  40 mg Intravenous Q12H   polyethylene glycol  34 g Per Tube BID   rosuvastatin  20 mg Per Tube Daily   Continuous Infusions:  dexmedetomidine (PRECEDEX) IV infusion 1.2 mcg/kg/hr (07/16/24 0800)   piperacillin-tazobactam (ZOSYN)  IV 12.5 mL/hr at 07/16/24 0800   propofol  (DIPRIVAN ) infusion 10 mcg/kg/min (07/16/24 0800)   TPN ADULT (ION) 70 mL/hr at 07/16/24 0800   PRN Meds: ondansetron , phenol, prochlorperazine, simethicone  Labs Reviewed: BUN 35 CBG ranges  from 125-172 mg/dL over the last 24 hours HgbA1c 5.5% (10/26)  Micronutrient Profile: Folate 16.9 Iron 61 Vitamin D  32.8 Vitamin B12 371  NUTRITION - FOCUSED PHYSICAL EXAM: Flowsheet Row Most Recent Value  Orbital Region Moderate depletion  Upper Arm Region Moderate depletion  Thoracic and Lumbar Region Mild depletion  Buccal Region Mild depletion  Temple Region Moderate depletion  Clavicle Bone Region Moderate depletion   Clavicle and Acromion Bone Region Severe depletion  Scapular Bone Region Severe depletion  Dorsal Hand Moderate depletion  Patellar Region Moderate depletion  Anterior Thigh Region Moderate depletion  Posterior Calf Region Severe depletion  Edema (RD Assessment) Moderate  [pitting edema to the BLE, right side more swollen than left]  Hair Reviewed  Eyes Reviewed  Mouth Reviewed  Skin Reviewed  Nails Reviewed    Diet Order:   Diet Order             Diet NPO time specified  Diet effective now                   EDUCATION NEEDS:  Education needs have been addressed  Skin:  Skin Assessment: Reviewed RN Assessment  Last BM:  10/30 - type 7  Height:  Ht Readings from Last 1 Encounters:  07/03/24 5' 7.5 (1.715 m)    Weight:  Wt Readings from Last 1 Encounters:  07/16/24 62.5 kg    Ideal Body Weight:  61.4 kg  BMI:  Body mass index is 21.26 kg/m.  Estimated Nutritional Needs:  Kcal:  1700-1900 kcal/d Protein:  80-95g/d Fluid:  1.8L/d    Vernell Lukes, RD, LDN, CNSC Registered Dietitian II Please reach out via secure chat

## 2024-07-16 NOTE — Progress Notes (Signed)
 PHARMACY - ANTICOAGULATION CONSULT NOTE  Pharmacy Consult for IV heparin   Indication: atrial fibrillation  Allergies  Allergen Reactions   Bee Venom Anaphylaxis and Other (See Comments)    Welts, also    Insect bites- Welts, also   Insect Extract Anaphylaxis and Other (See Comments)    Insect bites- Welts, also   Farxiga  [Dapagliflozin ] Nausea And Vomiting   Lyrica [Pregabalin] Nausea And Vomiting   Neurontin [Gabapentin] Nausea And Vomiting and Other (See Comments)    Hypertension/dizziness   Nsaids Nausea Only and Other (See Comments)    Patient is not suppose to have this class of medication (liver)  Can tolerate 81mg , which she is taking   Latex Rash and Other (See Comments)    Patient Measurements: Height: 5' 7.5 (171.5 cm) Weight: 62.5 kg (137 lb 12.6 oz) IBW/kg (Calculated) : 62.75 HEPARIN  DW (KG): 61.2  Vital Signs: Temp: 98.1 F (36.7 C) (10/30 0737) Temp Source: Axillary (10/30 0737) BP: 115/59 (10/30 0800) Pulse Rate: 77 (10/30 0800)  Labs: Recent Labs    07/14/24 0339 07/14/24 0844 07/15/24 0219 07/15/24 0859 07/16/24 0517  HGB 6.8*  --  6.8* 8.5* 8.4*  HCT 23.0*  --  22.4* 27.5* 26.6*  PLT 297  --  295 317 330  HEPARINUNFRC  --  0.63 0.51  --  0.32  CREATININE 0.71  --  0.75  --  0.67    Estimated Creatinine Clearance: 60 mL/min (by C-G formula based on SCr of 0.67 mg/dL).   Medical History: Past Medical History:  Diagnosis Date   AAA (abdominal aortic aneurysm)    3.4 cm   Anemia    Anxiety    Aortic atherosclerosis    Arthritis    Asthma    CAD (coronary artery disease)    a. Nonobstructive CAD by cath 07/17/13.   Cancer [facility])    Carotid artery disease    Carotid US  8/22: Bilateral ICA 1-39; right subclavian stenosis; right thyroid  nodule   CHD (congenital heart disease)    pt unaware???   Chest pain    a. Adm 10-07/2013 - CTA neg for PE/dissection, cath with nonobstructive disease, CP ?due to uncontrolled HTN vs coronary  vasospasm.   CHF (congestive heart failure) (HCC)    Closed head injury with concussion    COPD (chronic obstructive pulmonary disease) (HCC)    Coronary artery disease    Echocardiogram    Echocardiogram 06/2019: EF 60-65, impaired relaxation (Gr 1 DD), mild MAC, mild MR, mild to mod aortic valve sclerosis (no AS), trivial TR, mild LAE, normal RVSF, RVSP 30.7 (mildly elevated)   GERD (gastroesophageal reflux disease)    History of blood transfusion    childbirth, Hysterectomy   Hyperlipidemia    Hypertension    Neuropathy    OA (osteoarthritis)    Osteoporosis    PAD (peripheral artery disease)    a. s/p L fem-pop bypass b. recent evaluation at [facility], unamenable to intervention   PAD (peripheral artery disease)    Palpitations 02/22/2016   Pneumonia    Pre-diabetes    Premature ventricular contraction    PUD (peptic ulcer disease)    PVD (peripheral vascular disease) 07/17/2013   Thoracic aortic aneurysm    Chest/aorta CTA 04/2022: 14 x 8 focal saccular aneurysm versus penetrating atherosclerotic ulcer involving left side of proximal portion of transverse aortic arch; descending thoracic aorta and proximal abdominal aorta aneurysm 4.4 cm; aortic atherosclerosis; emphysema   Tobacco abuse    Wears dentures  Wears glasses     Medications:  Medications Prior to Admission  Medication Sig Dispense Refill Last Dose/Taking   albuterol  (PROVENTIL  HFA;VENTOLIN  HFA) 108 (90 BASE) MCG/ACT inhaler Inhale 2 puffs into the lungs every 6 (six) hours as needed for wheezing or shortness of breath.   06/29/2024 Evening   alendronate  (FOSAMAX ) 70 MG tablet Take 70 mg by mouth every Wednesday.   Past Week   allopurinol  (ZYLOPRIM ) 300 MG tablet Take 300 mg by mouth every evening.   06/29/2024 Noon   aspirin  EC 81 MG tablet Take 81 mg by mouth in the morning.   06/29/2024 Morning   atorvastatin  (LIPITOR ) 80 MG tablet Take 80 mg by mouth at bedtime.   06/29/2024 Morning   calcium  carbonate (ANTACID)  500 MG chewable tablet Chew 1 tablet by mouth 2 (two) times daily as needed for heartburn or indigestion.   Past Week   Cholecalciferol (VITAMIN D ) 125 MCG (5000 UT) CAPS Take 5,000 Units by mouth in the morning.   06/29/2024 Morning   cyanocobalamin  (VITAMIN B12) 1000 MCG tablet Take 1,000 mcg by mouth daily.   06/29/2024 Morning   Dupilumab  (DUPIXENT ) 300 MG/2ML SOAJ Inject 300 mg into the skin every 14 (fourteen) days. 12 mL 1 Past Week   EPIPEN  2-PAK 0.3 MG/0.3ML SOAJ injection Inject 0.3 mg into the muscle daily as needed for anaphylaxis. Use as directed as needed for allergies.   Taking As Needed   ezetimibe  (ZETIA ) 10 MG tablet Take 1 tablet (10 mg total) by mouth daily. 90 tablet 1 06/28/2024 Evening   fluticasone -salmeterol (WIXELA INHUB) 500-50 MCG/ACT AEPB Inhale 1 puff into the lungs in the morning and at bedtime. 3 each 3 06/29/2024 Morning   furosemide  (LASIX ) 40 MG tablet Take one tablet by mouth twice daily on Mondays, Wednesdays, and Fridays, then take one tablet by mouth daily on other days. 120 tablet 2 06/29/2024 Morning   Oxycodone  HCl 10 MG TABS Take 1 tablet (10 mg total) by mouth 4 (four) times daily as needed (for pain). 20 tablet 0 06/29/2024 Morning   pantoprazole  (PROTONIX ) 40 MG tablet Take 40 mg by mouth 2 (two) times daily.   06/29/2024 Morning   potassium chloride  SA (KLOR-CON  M20) 20 MEQ tablet TAKE 1 TABLET BY MOUTH TWICE DAILY ON MONDAYS, WEDNESDAYS, AND FRIDAYS, THEN TAKE ONE TABLET BY MOUTH DAILY ON OTHER DAYS. 120 tablet 2 06/29/2024 Morning   senna-docusate (SENOKOT-S) 8.6-50 MG tablet Take 2 tablets by mouth at bedtime.   06/28/2024 Bedtime   SPIRIVA  RESPIMAT 2.5 MCG/ACT AERS Take 2 puffs by mouth daily.   06/29/2024 Morning   spironolactone  (ALDACTONE ) 25 MG tablet Take 1 tablet (25 mg total) by mouth daily. 90 tablet 3 06/29/2024 Morning   verapamil  (CALAN -SR) 240 MG CR tablet Take 1 tablet (240 mg total) by mouth daily. 90 tablet 1 06/29/2024 Morning    albuterol  (PROVENTIL ) (2.5 MG/3ML) 0.083% nebulizer solution Take 2.5 mg by nebulization every 6 (six) hours as needed for wheezing or shortness of breath. (Patient not taking: Reported on 06/29/2024)   Not Taking   nitroGLYCERIN  (NITROSTAT ) 0.4 MG SL tablet Place 1 tablet (0.4 mg total) under the tongue every 5 (five) minutes x 3 doses as needed for chest pain. (Patient not taking: Reported on 06/29/2024) 25 tablet 4 Not Taking    Assessment: 73 yo F presenting s/p fall with hip fracture and ileus. Patient with new onset afib this admission. Pharmacy consulted for heparin  dosing. Deferred bolus given concern for bowel  perforation and slight drop in hemoglobin.  Heparin  level remains therapeutic today at 0.32 but has dropped from prior with no interruptions or issues with the infusion. No signs/symptoms of bleeding per RN. CBC stable: Hgb 8.4, Plt 330. Will increase slightly to avoid being subtherapeutic.   Goal of Therapy:  Heparin  level 0.3-0.7 units/ml Monitor platelets by anticoagulation protocol: Yes   Plan:  Increase heparin  infusion to 1050 units/hr Check anti-Xa level in 8 hours and daily while on heparin  Continue to monitor H&H and platelets  Elma Fail, PharmD PGY1 Clinical Pharmacist [facility]  07/16/2024 9:03 AM

## 2024-07-16 NOTE — TOC Progression Note (Signed)
 Transition of Care [facility]) - Progression Note    Patient Details  Name: Marie Johnson MRN: [id number] Date of Birth: Sep 30, 1950  [facility] CM/SW Contact  Lauraine FORBES Saa, LCSWA Phone Number: 07/16/2024, 12:19 PM  Clinical Narrative:     12:19 PM Per progressions, patient remains intubated with feeding tube. CSW will continue to follow.  Expected Discharge Plan: Skilled Nursing Facility Barriers to Discharge: Continued Medical Work up, SNF Pending bed offer               Expected Discharge Plan and Services In-house Referral: Clinical Social Work   Post Acute Care Choice: Skilled Nursing Facility Living arrangements for the past 2 months: Single Family Home                                       Social Drivers of Health (SDOH) Interventions SDOH Screenings   Food Insecurity: No Food Insecurity (06/29/2024)  Housing: High Risk (06/29/2024)  Transportation Needs: No Transportation Needs (06/29/2024)  Utilities: Not At Risk (06/29/2024)  Depression (PHQ2-9): Low Risk  (05/02/2023)  Social Connections: Moderately Isolated (06/29/2024)  Tobacco Use: Medium Risk (06/29/2024)    Readmission Risk Interventions    06/06/2023   12:35 PM  Readmission Risk Prevention Plan  Transportation Screening Complete  PCP or Specialist Appt within 5-7 Days Complete  Home Care Screening Complete  Medication Review (RN CM) Complete

## 2024-07-16 NOTE — Progress Notes (Signed)
 OT Cancellation Note  Patient Details Name: Marie Johnson MRN: [id number] DOB: [birth year]   Cancelled Treatment:    Reason Eval/Treat Not Completed: Patient not medically ready (Per RN, plan for potential extubation pending weaning trial today. OT will reattempt to see pt at a later time as appropriate/available.)  Marie Rockey CHRISTELLA., OTR/L, MA Acute Rehab [phone]   Marie Johnson Horns 07/16/2024, 11:22 AM

## 2024-07-16 NOTE — Progress Notes (Addendum)
 PT Cancellation Note  Patient Details Name: Marie Johnson MRN: [id number] DOB: 1951/06/18   Cancelled Treatment:    Reason Eval/Treat Not Completed: (P) Patient not medically ready. RN reporting plan for potential extubation pending weaning trial today. Will plan to follow-up as able.   Addendum 13:56 - RN reports no plans for extubation today but maybe tomorrow. Will plan to follow-up another day as able.    Theo Ferretti, PT, DPT Acute Rehabilitation Services  Office: [phone]    Theo CHRISTELLA Ferretti 07/16/2024, 7:59 AM

## 2024-07-16 NOTE — Progress Notes (Signed)
 PHARMACY - ANTICOAGULATION CONSULT NOTE  Pharmacy Consult for IV heparin   Indication: atrial fibrillation  Allergies  Allergen Reactions   Bee Venom Anaphylaxis and Other (See Comments)    Welts, also    Insect bites- Welts, also   Insect Extract Anaphylaxis and Other (See Comments)    Insect bites- Welts, also   Farxiga  [Dapagliflozin ] Nausea And Vomiting   Lyrica [Pregabalin] Nausea And Vomiting   Neurontin [Gabapentin] Nausea And Vomiting and Other (See Comments)    Hypertension/dizziness   Nsaids Nausea Only and Other (See Comments)    Patient is not suppose to have this class of medication (liver)  Can tolerate 81mg , which she is taking   Latex Rash and Other (See Comments)    Patient Measurements: Height: 5' 7.5 (171.5 cm) Weight: 62.5 kg (137 lb 12.6 oz) IBW/kg (Calculated) : 62.75 HEPARIN  DW (KG): 61.2  Vital Signs: Temp: 98.9 F (37.2 C) (10/30 1515) Temp Source: Axillary (10/30 1515) BP: 132/48 (10/30 1800) Pulse Rate: 80 (10/30 1800)  Labs: Recent Labs    07/14/24 0339 07/14/24 0844 07/15/24 0219 07/15/24 0859 07/16/24 0517 07/16/24 1742  HGB 6.8*  --  6.8* 8.5* 8.4*  --   HCT 23.0*  --  22.4* 27.5* 26.6*  --   PLT 297  --  295 317 330  --   HEPARINUNFRC  --    < > 0.51  --  0.32 0.36  CREATININE 0.71  --  0.75  --  0.67  --    < > = values in this interval not displayed.    Estimated Creatinine Clearance: 60 mL/min (by C-G formula based on SCr of 0.67 mg/dL).   Medical History: Past Medical History:  Diagnosis Date   AAA (abdominal aortic aneurysm)    3.4 cm   Anemia    Anxiety    Aortic atherosclerosis    Arthritis    Asthma    CAD (coronary artery disease)    a. Nonobstructive CAD by cath 07/17/13.   Cancer [facility])    Carotid artery disease    Carotid US  8/22: Bilateral ICA 1-39; right subclavian stenosis; right thyroid  nodule   CHD (congenital heart disease)    pt unaware???   Chest pain    a. Adm 10-07/2013 - CTA neg for  PE/dissection, cath with nonobstructive disease, CP ?due to uncontrolled HTN vs coronary vasospasm.   CHF (congestive heart failure) (HCC)    Closed head injury with concussion    COPD (chronic obstructive pulmonary disease) (HCC)    Coronary artery disease    Echocardiogram    Echocardiogram 06/2019: EF 60-65, impaired relaxation (Gr 1 DD), mild MAC, mild MR, mild to mod aortic valve sclerosis (no AS), trivial TR, mild LAE, normal RVSF, RVSP 30.7 (mildly elevated)   GERD (gastroesophageal reflux disease)    History of blood transfusion    childbirth, Hysterectomy   Hyperlipidemia    Hypertension    Neuropathy    OA (osteoarthritis)    Osteoporosis    PAD (peripheral artery disease)    a. s/p L fem-pop bypass b. recent evaluation at [facility], unamenable to intervention   PAD (peripheral artery disease)    Palpitations 02/22/2016   Pneumonia    Pre-diabetes    Premature ventricular contraction    PUD (peptic ulcer disease)    PVD (peripheral vascular disease) 07/17/2013   Thoracic aortic aneurysm    Chest/aorta CTA 04/2022: 14 x 8 focal saccular aneurysm versus penetrating atherosclerotic ulcer involving left side of  proximal portion of transverse aortic arch; descending thoracic aorta and proximal abdominal aorta aneurysm 4.4 cm; aortic atherosclerosis; emphysema   Tobacco abuse    Wears dentures    Wears glasses     Medications:  Medications Prior to Admission  Medication Sig Dispense Refill Last Dose/Taking   albuterol  (PROVENTIL  HFA;VENTOLIN  HFA) 108 (90 BASE) MCG/ACT inhaler Inhale 2 puffs into the lungs every 6 (six) hours as needed for wheezing or shortness of breath.   06/29/2024 Evening   alendronate  (FOSAMAX ) 70 MG tablet Take 70 mg by mouth every Wednesday.   Past Week   allopurinol  (ZYLOPRIM ) 300 MG tablet Take 300 mg by mouth every evening.   06/29/2024 Noon   aspirin  EC 81 MG tablet Take 81 mg by mouth in the morning.   06/29/2024 Morning   atorvastatin  (LIPITOR ) 80 MG  tablet Take 80 mg by mouth at bedtime.   06/29/2024 Morning   calcium  carbonate (ANTACID) 500 MG chewable tablet Chew 1 tablet by mouth 2 (two) times daily as needed for heartburn or indigestion.   Past Week   Cholecalciferol (VITAMIN D ) 125 MCG (5000 UT) CAPS Take 5,000 Units by mouth in the morning.   06/29/2024 Morning   cyanocobalamin  (VITAMIN B12) 1000 MCG tablet Take 1,000 mcg by mouth daily.   06/29/2024 Morning   Dupilumab  (DUPIXENT ) 300 MG/2ML SOAJ Inject 300 mg into the skin every 14 (fourteen) days. 12 mL 1 Past Week   EPIPEN  2-PAK 0.3 MG/0.3ML SOAJ injection Inject 0.3 mg into the muscle daily as needed for anaphylaxis. Use as directed as needed for allergies.   Taking As Needed   ezetimibe  (ZETIA ) 10 MG tablet Take 1 tablet (10 mg total) by mouth daily. 90 tablet 1 06/28/2024 Evening   fluticasone -salmeterol (WIXELA INHUB) 500-50 MCG/ACT AEPB Inhale 1 puff into the lungs in the morning and at bedtime. 3 each 3 06/29/2024 Morning   furosemide  (LASIX ) 40 MG tablet Take one tablet by mouth twice daily on Mondays, Wednesdays, and Fridays, then take one tablet by mouth daily on other days. 120 tablet 2 06/29/2024 Morning   Oxycodone  HCl 10 MG TABS Take 1 tablet (10 mg total) by mouth 4 (four) times daily as needed (for pain). 20 tablet 0 06/29/2024 Morning   pantoprazole  (PROTONIX ) 40 MG tablet Take 40 mg by mouth 2 (two) times daily.   06/29/2024 Morning   potassium chloride  SA (KLOR-CON  M20) 20 MEQ tablet TAKE 1 TABLET BY MOUTH TWICE DAILY ON MONDAYS, WEDNESDAYS, AND FRIDAYS, THEN TAKE ONE TABLET BY MOUTH DAILY ON OTHER DAYS. 120 tablet 2 06/29/2024 Morning   senna-docusate (SENOKOT-S) 8.6-50 MG tablet Take 2 tablets by mouth at bedtime.   06/28/2024 Bedtime   SPIRIVA  RESPIMAT 2.5 MCG/ACT AERS Take 2 puffs by mouth daily.   06/29/2024 Morning   spironolactone  (ALDACTONE ) 25 MG tablet Take 1 tablet (25 mg total) by mouth daily. 90 tablet 3 06/29/2024 Morning   verapamil  (CALAN -SR) 240 MG CR  tablet Take 1 tablet (240 mg total) by mouth daily. 90 tablet 1 06/29/2024 Morning   albuterol  (PROVENTIL ) (2.5 MG/3ML) 0.083% nebulizer solution Take 2.5 mg by nebulization every 6 (six) hours as needed for wheezing or shortness of breath. (Patient not taking: Reported on 06/29/2024)   Not Taking   nitroGLYCERIN  (NITROSTAT ) 0.4 MG SL tablet Place 1 tablet (0.4 mg total) under the tongue every 5 (five) minutes x 3 doses as needed for chest pain. (Patient not taking: Reported on 06/29/2024) 25 tablet 4 Not Taking  Assessment: 73 yo F presenting s/p fall with hip fracture and ileus. Patient with new onset afib this admission. Pharmacy consulted for heparin  dosing. Deferred bolus given concern for bowel perforation and slight drop in hemoglobin.  Heparin  level remains therapeutic today at 0.32 but has dropped from prior with no interruptions or issues with the infusion. No signs/symptoms of bleeding per RN. CBC stable: Hgb 8.4, Plt 330. Will increase slightly to avoid being subtherapeutic.   10/30 PM: Heparin  level 0.36. Therapeutic. Continue current dosing  Goal of Therapy:  Heparin  level 0.3-0.7 units/ml Monitor platelets by anticoagulation protocol: Yes   Plan:  Continue heparin  infusion at1050 units/hr Check anti-Xa level daily while on heparin  Continue to monitor H&H and platelets  Larraine Brazier, PharmD Clinical Pharmacist 07/16/2024  6:51 PM **Pharmacist phone directory can now be found on [email or web address] ([email or web address]).  Listed under [facility].

## 2024-07-16 NOTE — Progress Notes (Signed)
 NAME:  Marie Johnson, MRN:  [id number], DOB:  03-30-1951, LOS: 17 ADMISSION DATE:  06/29/2024, CONSULTATION DATE:  07/12/2024 REFERRING MD:  TRH, CHIEF COMPLAINT:   Acute respiratory distress  History of Present Illness:  Marie Johnson is a 73 y.o. female with a past medical history significant for but not limited to CHF, COPD, chronic hypoxic respiratory failure on 2 L nasal cannula at baseline, HTN, HLD, PAD, osteoporosis, and anxiety initially presented to the ED at [facility] 10/13 after suffering mechanical fall resulting in right periprosthetic femur fracture.  Since admission patient has had several complications, see timeline below  Pertinent  Medical History  CHF, COPD, chronic hypoxic respiratory failure on 2 L nasal cannula at baseline, HTN, HLD, PAD, osteoporosis, and anxiety  Significant Hospital Events: Including procedures, antibiotic start and stop dates in addition to other pertinent events   10/13 presented after suffering mechanical fall at home resulting in acute periprosthetic right femur fracture 10/16 abdominal distention noticed, KUB obtained 10/17 colonic ileus confirmed 10/18 GI consulted recommended medical management 10/21 chest x-ray with patchy densities in the right lung concerning for aspiration pneumonia in the setting of ileus 10/23 TPN started via PICC line 10/25 developed worsening respiratory distress with change in mentation transferred to ICU and emergently admitted  Interim History / Subjective:  Overnight she was started on fentanyl - no description as to why.   Objective    Blood pressure (!) 125/59, pulse 73, temperature 98.1 F (36.7 C), temperature source Axillary, resp. rate (!) 25, height 5' 7.5 (1.715 m), weight 62.5 kg, SpO2 98%.    Vent Mode: PRVC FiO2 (%):  [40 %-70 %] 60 % Set Rate:  [24 bmp] 24 bmp Vt Set:  [500 mL] 500 mL PEEP:  [5 cmH20-10 cmH20] 8 cmH20 Pressure Support:  [8 cmH20] 8 cmH20 Plateau Pressure:  [20 cmH20-21  cmH20] 20 cmH20   Intake/Output Summary (Last 24 hours) at 07/16/2024 0741 Last data filed at 07/16/2024 0600 Gross per 24 hour  Intake 3333.83 ml  Output 1480 ml  Net 1853.83 ml   Filed Weights   07/03/24 2004 07/10/24 0500 07/16/24 0200  Weight: 61.2 kg 66.7 kg 62.5 kg    Examination: General: critically ill appearing woman lying in bed in NAD HEENT: [place]/AT, eyes anicteric Neuro: RASS -5, reactive pupils CV: S1S2, reg rate, irreg rhtyhm PULM: breathing comfortably on MV, minimal ETT secretions GI: still distended, absent bowel sounds Extremities: mild peripheral edema   K+ 4.0 BUN 35 Cr 0.67 WBC 18.3 H/H 8.4/26.6 Platelets 330 Resp culture: few WBC> enterobacter cloacae> pansensitive   Resolved problem list   Assessment and Plan  Acute on chronic hypoxic respiratory failure on 2 L nasal cannula at baseline due to history of COPD- -10/26 emergently intubated due to worsening respiratory distress Aspiration pneumonia RLL- Enterobacter - CXR today-- not sure why she's needing higher FiO2 still -LTVV -VAP prevention protocol -PAD protocol- turn off fentanyl  since ileus is the primary problem. Con't propofol  and precedex -FiO2 too high for SBT today -con't zosyn, day 5 -bronchodilators -CPT, hypertonic nebs -lasix   Prolonged ongoing colonic ileus - Developed pretty early on stay given opioid use during acute fracture, immobility.  TPN started 10/23. Per husband, had a history of chronic constipation and used laxatives chronically. - bowel regimen -relistor since she got fentanyl  all night -appreciate GI's management -avoid all opiates -OGT to LIWS -con't TPN & PICC -monitor electrolytes, replete as needed - Appreciate GI's management - Avoid all opiates -  fracture limits mobility, which will slow ileus recovery  New onset A-fib RVR, due to critical illness. Rate control improved today. -con't amiodarone, switch to oral -heparin  -tele monitoring -monitor  electrolytes and replete as needed  Acute periprosthetic right femur fracture -per 10/4 ortho note, nonoperative management. WBAT.  -needs OP follow up with [facility] -nonopiate pain control  Anemia of chronic disease; although hemoglobin was low again today this was same without getting blood products in between.  Very low suspicion for active bleeding. B12 deficiency -monitor, transfuse for Hb <7 or hemodynamically significant bleeding -con't heparin   Chronic HFpEF History of CAD-Status post [facility] July 2024 with mild to moderate nonobstructive disease was previously on DAPT for PAD Hyperlipidemia Essential hypertension PAD -lasix  -aspirin , statin - Holding PTA antihypertensives.  Worsening leukocytosis -repeat CXR -monitor for fevers -if sputum increasing, can repeat trach aspirate culture  Labs   CBC: Recent Labs  Lab 07/13/24 0310 07/14/24 0339 07/15/24 0219 07/15/24 0859 07/16/24 0517  WBC 9.5 10.7* 10.5 12.6* 18.3*  HGB 7.8* 6.8* 6.8* 8.5* 8.4*  HCT 25.0* 23.0* 22.4* 27.5* 26.6*  MCV 100.4* 102.7* 102.3* 98.9 98.9  PLT 324 297 295 317 330    Basic Metabolic Panel: Recent Labs  Lab 07/12/24 0609 07/12/24 1558 07/13/24 0310 07/13/24 2221 07/14/24 0339 07/15/24 0219 07/16/24 0517  NA 137 139 138  --  139 139 144  K 3.5 3.7 3.2* 4.0 4.2 4.8 4.0  CL 100  --  104  --  108 104 108  CO2 27  --  24  --  23 25 25   GLUCOSE 182*  --  191*  --  127* 121* 144*  BUN 20  --  27*  --  29* 33* 35*  CREATININE 0.51  --  0.64  --  0.71 0.75 0.67  CALCIUM  8.4*  --  8.3*  --  8.0* 8.0* 7.8*  MG 1.9  --  2.1 2.2 2.3 2.6* 2.4  PHOS 3.6  --  3.4  --  3.7 3.7 3.4   GFR: Estimated Creatinine Clearance: 60 mL/min (by C-G formula based on SCr of 0.67 mg/dL). Recent Labs  Lab 07/12/24 1422 07/12/24 1752 07/13/24 0310 07/14/24 0339 07/15/24 0219 07/15/24 0859 07/16/24 0517  WBC  --   --    < > 10.7* 10.5 12.6* 18.3*  LATICACIDVEN 2.5* 1.3  --   --   --   --   --     < > = values in this interval not displayed.    Liver Function Tests: Recent Labs  Lab 07/10/24 0355 07/11/24 0850 07/12/24 0609  AST  --   --  19  ALT  --   --  12  ALKPHOS  --   --  74  BILITOT  --   --  0.8  PROT  --   --  6.3*  ALBUMIN  2.1* 2.3* 2.4*   Critical care time:     This patient is critically ill with multiple organ system failure which requires frequent high complexity decision making, assessment, support, evaluation, and titration of therapies. This was completed through the application of advanced monitoring technologies and extensive interpretation of multiple databases. During this encounter critical care time was devoted to patient care services described in this note for 34 minutes.  Leita SHAUNNA Gaskins, DO 07/16/24 9:11 AM [facility]  For contact information, see Amion. If no response to pager, please call PCCM consult pager. After hours, 7PM- 7AM, please call  Elink.

## 2024-07-16 NOTE — Progress Notes (Signed)
 PHARMACY - TOTAL PARENTERAL NUTRITION CONSULT NOTE   Indication: Prolonged ileus  Patient Measurements: Height: 5' 7.5 (171.5 cm) Weight: 62.5 kg (137 lb 12.6 oz) IBW/kg (Calculated) : 62.75 TPN AdjBW (KG): 61.2 Body mass index is 21.26 kg/m. Usual Weight: 140lbs per pt, 135 lbs per chart review   Assessment:  73 yo W admitted with mechanical fall and right femur fracture 10/13 and then found to have ileus on KUB 10/16. Pt was switched to clear liquid diet 10/17-10/20 but vomited what she tried drink. Patient has been NPO since so has had no intake for 7 days. She had N/V and required NG tube placement 10/20 PM with brown particulate output and improvement in N/V. The last meal patient remembers eating is a ham and beef sandwich on 10/13. She normally eats 2-3 meals a day - oatmeal or grits for breakfast (doesn't like eggs), sandwiches and meals from meals on wheels. She is not a big eater but is very hungry as of 10/23 and has liked vanilla ensures in the past. She has been on D5LR since 10/20 PM, receiving 90g dextrose /24hr, which will help decrease refeeding risk. Pharmacy consulted for TPN.   Glucose / Insulin: BG <180, used 8 units SSI/24hr; no hx DM Electrolytes: K 4.0, CoCa 9.6, others wnl Renal: Scr 0.67, BUN 35 Hepatic: Alk phos/AST/AL/Tbili wnl, albumin  2.4, TG 48 Intake / Output; MIVF: UOP 0.7 ml/kg/hr, NGT output 375 mL, LBM 10/28 x1  - maintains on Neostigmine q6h, bisacodyl  BID, Miralax  to 34g BID GI Imaging: 10/16 KUB: Diffuse bowel gas distension throughout the abdomen, possible ileus  10/17 CT: Gaseous distention of the colon without discrete focal transition point or mass lesion likely representing ileus 10/20 KUB: Persistent gaseous dilatation of the large bowel 10/21 KUB: moderate diffuse bowel distension, consistent with ileus 10/24 KUB:  Gaseous distension of large and small bowel consistent with ileus, slightly improved from prior. 10/27 CTAP: diffusely gas filled  dilated colon , few narrowed segments > ileus  GI Surgeries / Procedures:  10/24 NGT removed   Central access: PICC 10/23  TPN start date: 10/23  Nutritional Goals: Goal TPN rate with full lipids is 70 mL/hr (provides 90 g of protein and 1705 kcals per day) -meeting 100% needs  Goal TPN rate with reduced lipids (25%) is 70 mL/hr (provides 90 g of protein and 1589 kcals per day) -with Propofol  meeting 100% needs  RD Assessment: Estimated Needs Total Energy Estimated Needs: 1700-1900 kcal/d Total Protein Estimated Needs: 80-95g/d Total Fluid Estimated Needs: 1.8L/d  Current Nutrition:  TPN 10/23 CLD 10/24 FLD- had beef broth, cranberry juice, coffee, chocolate pudding, apple sauce, no Ensure 10/25 Soft - patient in a lot of pain, told RN she doesn't like Ensure and refused. Only had soup and ice cream 10/26 Soft - patient willing to try Ensure  10/27-28 NPO -propofol  @20 -40 mcg/kg/min > 8-16 ml/hr > 211-422 kcal /day  10/29 NPO - propofol  @ 20 mcg/kg/min > 8 ml/hr 10/30 NPO - propofol  @10 -20 mcg/kg/min > 4-8 ml/hr (plan to keep on)   Plan:  Continue TPN at new goal of 70 mL/hr with reduced lipids at 1800, provides 100% estimated needs with propofol  Electrolytes in TPN: Na 120 mEq/L, K 39 mEq/L, Ca 2 mEq/L, Mg 5 mEq/L, and Phos 10 mmol/L. Cl:Ac 1:1 Per tube MVI and trace elements   Continue sensitive SSI q4h and monitor CBGs Monitor TPN labs Mon/Thurs and PRN Relistor x1 today  Sharyne Glatter, PharmD, BCCCP Critical Care Clinical Pharmacist 07/16/2024  9:00 AM

## 2024-07-17 ENCOUNTER — Inpatient Hospital Stay (HOSPITAL_COMMUNITY)

## 2024-07-17 DIAGNOSIS — J690 Pneumonitis due to inhalation of food and vomit: Secondary | ICD-10-CM | POA: Diagnosis not present

## 2024-07-17 DIAGNOSIS — J449 Chronic obstructive pulmonary disease, unspecified: Secondary | ICD-10-CM | POA: Diagnosis not present

## 2024-07-17 DIAGNOSIS — K567 Ileus, unspecified: Secondary | ICD-10-CM | POA: Diagnosis not present

## 2024-07-17 DIAGNOSIS — J9621 Acute and chronic respiratory failure with hypoxia: Secondary | ICD-10-CM | POA: Diagnosis not present

## 2024-07-17 LAB — CULTURE, RESPIRATORY W GRAM STAIN

## 2024-07-17 LAB — MRSA NEXT GEN BY PCR, NASAL: MRSA by PCR Next Gen: NOT DETECTED

## 2024-07-17 LAB — BASIC METABOLIC PANEL WITH GFR
Anion gap: 10 (ref 5–15)
BUN: 32 mg/dL — ABNORMAL HIGH (ref 8–23)
CO2: 28 mmol/L (ref 22–32)
Calcium: 7.5 mg/dL — ABNORMAL LOW (ref 8.9–10.3)
Chloride: 110 mmol/L (ref 98–111)
Creatinine, Ser: 0.77 mg/dL (ref 0.44–1.00)
GFR, Estimated: >60 mL/min (ref >60)
Glucose, Bld: 134 mg/dL — ABNORMAL HIGH (ref 70–99)
Potassium: 2.9 mmol/L — ABNORMAL LOW (ref 3.5–5.1)
Sodium: 148 mmol/L — ABNORMAL HIGH (ref 135–145)

## 2024-07-17 LAB — GLUCOSE, CAPILLARY
Glucose-Capillary: 126 mg/dL — ABNORMAL HIGH (ref 70–99)
Glucose-Capillary: 127 mg/dL — ABNORMAL HIGH (ref 70–99)
Glucose-Capillary: 132 mg/dL — ABNORMAL HIGH (ref 70–99)
Glucose-Capillary: 140 mg/dL — ABNORMAL HIGH (ref 70–99)
Glucose-Capillary: 142 mg/dL — ABNORMAL HIGH (ref 70–99)
Glucose-Capillary: 160 mg/dL — ABNORMAL HIGH (ref 70–99)

## 2024-07-17 LAB — CBC
HCT: 24.5 % — ABNORMAL LOW (ref 36.0–46.0)
HCT: 24.4 % — ABNORMAL LOW (ref 36.0–46.0)
Hemoglobin: 7.6 g/dL — ABNORMAL LOW (ref 12.0–15.0)
Hemoglobin: 7.4 g/dL — ABNORMAL LOW (ref 12.0–15.0)
MCH: 30.8 pg (ref 26.0–34.0)
MCH: 31.4 pg (ref 26.0–34.0)
MCHC: 31 g/dL (ref 30.0–36.0)
MCHC: 30.3 g/dL (ref 30.0–36.0)
MCV: 99.2 fL (ref 80.0–100.0)
MCV: 103.4 fL — ABNORMAL HIGH (ref 80.0–100.0)
Platelets: 304 K/uL (ref 150–400)
Platelets: 268 K/uL (ref 150–400)
RBC: 2.47 MIL/uL — ABNORMAL LOW (ref 3.87–5.11)
RBC: 2.36 MIL/uL — ABNORMAL LOW (ref 3.87–5.11)
RDW: 25.9 % — ABNORMAL HIGH (ref 11.5–15.5)
RDW: 26 % — ABNORMAL HIGH (ref 11.5–15.5)
WBC: 18.1 K/uL — ABNORMAL HIGH (ref 4.0–10.5)
WBC: 16.4 K/uL — ABNORMAL HIGH (ref 4.0–10.5)
nRBC: 0.2 % (ref 0.0–0.2)
nRBC: 0.1 % (ref 0.0–0.2)

## 2024-07-17 LAB — PHOSPHORUS: Phosphorus: 2.9 mg/dL (ref 2.5–4.6)

## 2024-07-17 LAB — MAGNESIUM: Magnesium: 2.1 mg/dL (ref 1.7–2.4)

## 2024-07-17 LAB — HEPARIN LEVEL (UNFRACTIONATED): Heparin Unfractionated: 0.22 [IU]/mL — ABNORMAL LOW (ref 0.30–0.70)

## 2024-07-17 MED ORDER — FUROSEMIDE 10 MG/ML IJ SOLN
60.0000 mg | Freq: Once | INTRAMUSCULAR | Status: AC
  Administered 2024-07-17: 60 mg via INTRAVENOUS
  Filled 2024-07-17: qty 6

## 2024-07-17 MED ORDER — POTASSIUM CHLORIDE 10 MEQ/50ML IV SOLN
10.0000 meq | INTRAVENOUS | Status: AC
  Administered 2024-07-17 (×8): 10 meq via INTRAVENOUS
  Filled 2024-07-17 (×7): qty 50

## 2024-07-17 MED ORDER — OXYCODONE HCL 5 MG/5ML PO SOLN
5.0000 mg | ORAL | Status: DC | PRN
  Administered 2024-07-17 – 2024-07-19 (×2): 10 mg
  Filled 2024-07-17 (×2): qty 10

## 2024-07-17 MED ORDER — METOCLOPRAMIDE HCL 5 MG/ML IJ SOLN
10.0000 mg | Freq: Three times a day (TID) | INTRAMUSCULAR | Status: AC
  Administered 2024-07-17 (×3): 10 mg via INTRAVENOUS
  Filled 2024-07-17 (×3): qty 2

## 2024-07-17 MED ORDER — VANCOMYCIN HCL 1250 MG/250ML IV SOLN: 1250.0000 mg | INTRAVENOUS | Status: DC

## 2024-07-17 MED ORDER — SODIUM CHLORIDE 0.9 % IV SOLN: 2.0000 g | Freq: Three times a day (TID) | INTRAVENOUS | Status: DC

## 2024-07-17 MED ORDER — POTASSIUM CHLORIDE 20 MEQ PO PACK: 40.0000 meq | PACK | ORAL | Status: DC

## 2024-07-17 MED ORDER — VANCOMYCIN HCL 1500 MG/300ML IV SOLN
1500.0000 mg | Freq: Once | INTRAVENOUS | Status: AC
  Administered 2024-07-17: 1500 mg via INTRAVENOUS
  Filled 2024-07-17: qty 300

## 2024-07-17 MED ORDER — SODIUM CHLORIDE 0.9 % IV SOLN
2.0000 g | Freq: Three times a day (TID) | INTRAVENOUS | Status: DC
  Administered 2024-07-17 – 2024-07-19 (×7): 2 g via INTRAVENOUS
  Filled 2024-07-17 (×7): qty 12.5

## 2024-07-17 MED ORDER — TRAVASOL 10 % IV SOLN
INTRAVENOUS | Status: AC
  Filled 2024-07-17: qty 907.2

## 2024-07-17 NOTE — Progress Notes (Signed)
 Pharmacy Antibiotic Note  Marie Johnson is a 73 y.o. female admitted on 06/29/2024 with pneumonia.  Pharmacy has been consulted for cefepime/vancomycin dosing.  Plan: Vancomycin 1500 IV every x1, then 1250mg  q24h (eAUC 511, Vd 0.72, Scr 0.8) Cefepime 2g IV q8h  Height: 5' 7.5 (171.5 cm) Weight: 62.5 kg (137 lb 12.6 oz) IBW/kg (Calculated) : 62.75  Temp (24hrs), Avg:98.6 F (37 C), Min:97.2 F (36.2 C), Max:100.7 F (38.2 C)  Recent Labs  Lab 07/12/24 1422 07/12/24 1752 07/13/24 0310 07/14/24 0339 07/15/24 0219 07/15/24 0859 07/16/24 0517 07/17/24 0418  WBC  --   --  9.5 10.7* 10.5 12.6* 18.3* 18.1*  CREATININE  --   --  0.64 0.71 0.75  --  0.67 0.77  LATICACIDVEN 2.5* 1.3  --   --   --   --   --   --     Estimated Creatinine Clearance: 60 mL/min (by C-G formula based on SCr of 0.77 mg/dL).    Allergies  Allergen Reactions   Bee Venom Anaphylaxis and Other (See Comments)    Welts, also    Insect bites- Welts, also   Insect Extract Anaphylaxis and Other (See Comments)    Insect bites- Welts, also   Farxiga  [Dapagliflozin ] Nausea And Vomiting   Lyrica [Pregabalin] Nausea And Vomiting   Neurontin [Gabapentin] Nausea And Vomiting and Other (See Comments)    Hypertension/dizziness   Nsaids Nausea Only and Other (See Comments)    Patient is not suppose to have this class of medication (liver)  Can tolerate 81mg , which she is taking   Latex Rash and Other (See Comments)     Microbiology results: 10/27 Tracheal Aspirate: e.cloacae, pseudomonas, rare candida 10/26 MRSA PCR: neg  Thank you for allowing pharmacy to be a part of this patient's care.  Elma Fail, PharmD PGY1 Clinical Pharmacist [facility]  07/17/2024 8:58 AM

## 2024-07-17 NOTE — Progress Notes (Signed)
 Sputum sample collected and sent to lab by RT. Lab notified, MD aware.

## 2024-07-17 NOTE — Progress Notes (Signed)
 NAME:  Marie Johnson, MRN:  [id number], DOB:  October 22, 1950, LOS: 18 ADMISSION DATE:  06/29/2024, CONSULTATION DATE:  07/12/2024 REFERRING MD:  TRH, CHIEF COMPLAINT:   Acute respiratory distress  History of Present Illness:  Marie Johnson is a 73 y.o. female with a past medical history significant for but not limited to CHF, COPD, chronic hypoxic respiratory failure on 2 L nasal cannula at baseline, HTN, HLD, PAD, osteoporosis, and anxiety initially presented to the ED at [facility] 10/13 after suffering mechanical fall resulting in right periprosthetic femur fracture.  Since admission patient has had several complications, see timeline below  Pertinent  Medical History  CHF, COPD, chronic hypoxic respiratory failure on 2 L nasal cannula at baseline, HTN, HLD, PAD, osteoporosis, and anxiety  Significant Hospital Events: Including procedures, antibiotic start and stop dates in addition to other pertinent events   10/13 presented after suffering mechanical fall at home resulting in acute periprosthetic right femur fracture 10/16 abdominal distention noticed, KUB obtained 10/17 colonic ileus confirmed 10/18 GI consulted recommended medical management 10/21 chest x-ray with patchy densities in the right lung concerning for aspiration pneumonia in the setting of ileus 10/23 TPN started via PICC line 10/25 developed worsening respiratory distress with change in mentation transferred to ICU and emergently admitted  Interim History / Subjective:  This morning she is sedated. No acute events overnight.  Tmax 100.7  Objective    Blood pressure (!) 140/44, pulse 76, temperature (!) 97.5 F (36.4 C), temperature source Axillary, resp. rate (!) 24, height 5' 7.5 (1.715 m), weight 62.5 kg, SpO2 93%.    Vent Mode: PRVC FiO2 (%):  [50 %] 50 % Set Rate:  [24 bmp] 24 bmp Vt Set:  [500 mL] 500 mL PEEP:  [8 cmH20] 8 cmH20 Plateau Pressure:  [17 cmH20-24 cmH20] 17 cmH20   Intake/Output Summary (Last  24 hours) at 07/17/2024 0824 Last data filed at 07/17/2024 9478 Gross per 24 hour  Intake 2275.42 ml  Output 4450 ml  Net -2174.58 ml   Filed Weights   07/03/24 2004 07/10/24 0500 07/16/24 0200  Weight: 61.2 kg 66.7 kg 62.5 kg    Examination: General:  critically ill appearing woman lying in bed in NAD HEENT: [place]/AT, eyes anicteric Neuro: RASS -3, moderate cough CV: S1S2, irreg rhythm, reg rate PULM: breathing comfortably on MV, thick dark tan sputum, rhonchi GI: still moderately distended, mild TTP Extremities: improving edema  NGT output 650cc  Na+ 148 K+ 2.9 BUN 32 Cr 0.77 WBC 18.1 H/H 7.6/24.5 Platelets 304 Resp culture: few WBC> enterobacter cloacae> pansensitive   Resolved problem list   Assessment and Plan  Acute on chronic hypoxic respiratory failure on 2 L nasal cannula at baseline due to history of COPD- -10/26 emergently intubated due to worsening respiratory distress Aspiration pneumonia RLL- Enterobacter -reculture sputum -LTVV -VAP prevention protocol -PAD protocol for sedation -daily SAT & SBT as appropriate -broaden antibiotics- cefepime & vanc. Worsened on zosyn despite this being adequate coverage for enterobacter.  -con't bronchodilators -CPT, 3% saline nebs  Worsened leukocytosis, new fevers. Worry about repeat aspiration pneumonia with worsening sputum. -reculture sputum -CXR -change to cefepime + vanc -MRSA nares -still needs picc unfortunately for TPN -still needs foley due to moisture associated skin breakdown from bowel regimen/ liquid stool output with ileus  Prolonged ongoing colonic ileus - Developed pretty early on stay given opioid use during acute fracture, immobility.  TPN started 10/23. Per husband, had a history of chronic constipation and used  laxatives chronically. Still has high NGT output. -con't bowel regimen -con't OT to LIWS-- worry about repeat aspiration events causing worsening respiratory status -bowel  regimen -trying to avoid all opiates -mobility unfortunately has been severely limited -con't TPN, PIC line -replete electrolytes -trial of reglan  New onset A-fib RVR, due to critical illness. Rate control improved today. -con't amiodarone; if going back into RVR on oral will go back to IV -heparin  -tele monitoring -monitor electrolytes and replete as needed  Acute periprosthetic right femur fracture -per 10/4 ortho note, nonoperative management. WBAT.  -needs OP follow up with [facility] -nonopiate pain control preferred  Anemia of chronic disease; although hemoglobin was low again today this was same without getting blood products in between.  With drop in Hb last 24 hrs despite being net negative volume status, worry more about occult bleeidng B12 deficiency -monitor, transfuse for Hb <7 or hemodynamically significant bleeding -heparin  stopped due to concern for bleeding -CBC this afternoon  Chronic HFpEF History of CAD-Status post [facility] July 2024 with mild to moderate nonobstructive disease was previously on DAPT for PAD Hyperlipidemia Essential hypertension PAD -lasix  once today -aspirin , statin -holding PTA antihypertensives    Labs   CBC: Recent Labs  Lab 07/14/24 0339 07/15/24 0219 07/15/24 0859 07/16/24 0517 07/17/24 0418  WBC 10.7* 10.5 12.6* 18.3* 18.1*  HGB 6.8* 6.8* 8.5* 8.4* 7.6*  HCT 23.0* 22.4* 27.5* 26.6* 24.5*  MCV 102.7* 102.3* 98.9 98.9 99.2  PLT 297 295 317 330 304    Basic Metabolic Panel: Recent Labs  Lab 07/13/24 0310 07/13/24 2221 07/14/24 0339 07/15/24 0219 07/16/24 0517 07/17/24 0418  NA 138  --  139 139 144 148*  K 3.2* 4.0 4.2 4.8 4.0 2.9*  CL 104  --  108 104 108 110  CO2 24  --  23 25 25 28   GLUCOSE 191*  --  127* 121* 144* 134*  BUN 27*  --  29* 33* 35* 32*  CREATININE 0.64  --  0.71 0.75 0.67 0.77  CALCIUM  8.3*  --  8.0* 8.0* 7.8* 7.5*  MG 2.1 2.2 2.3 2.6* 2.4 2.1  PHOS 3.4  --  3.7 3.7 3.4 2.9   GFR: Estimated  Creatinine Clearance: 60 mL/min (by C-G formula based on SCr of 0.77 mg/dL). Recent Labs  Lab 07/12/24 1422 07/12/24 1752 07/13/24 0310 07/15/24 0219 07/15/24 0859 07/16/24 0517 07/17/24 0418  WBC  --   --    < > 10.5 12.6* 18.3* 18.1*  LATICACIDVEN 2.5* 1.3  --   --   --   --   --    < > = values in this interval not displayed.    Liver Function Tests: Recent Labs  Lab 07/11/24 0850 07/12/24 0609  AST  --  19  ALT  --  12  ALKPHOS  --  74  BILITOT  --  0.8  PROT  --  6.3*  ALBUMIN  2.3* 2.4*   Critical care time:     This patient is critically ill with multiple organ system failure which requires frequent high complexity decision making, assessment, support, evaluation, and titration of therapies. This was completed through the application of advanced monitoring technologies and extensive interpretation of multiple databases. During this encounter critical care time was devoted to patient care services described in this note for 36 minutes.  Leita SHAUNNA Gaskins, DO 07/17/24 8:38 AM [facility]  For contact information, see Amion. If no response to pager, please call PCCM  consult pager. After hours, 7PM- 7AM, please call Elink.

## 2024-07-17 NOTE — Progress Notes (Addendum)
 PHARMACY - TOTAL PARENTERAL NUTRITION CONSULT NOTE   Indication: Prolonged ileus  Patient Measurements: Height: 5' 7.5 (171.5 cm) Weight: 62.5 kg (137 lb 12.6 oz) IBW/kg (Calculated) : 62.75 TPN AdjBW (KG): 61.2 Body mass index is 21.26 kg/m. Usual Weight: 140lbs per pt, 135 lbs per chart review   Assessment:  73 yo W admitted with mechanical fall and right femur fracture 10/13 and then found to have ileus on KUB 10/16. Pt was switched to clear liquid diet 10/17-10/20 but vomited what she tried drink. Patient has been NPO since so has had no intake for 7 days. She had N/V and required NG tube placement 10/20 PM with brown particulate output and improvement in N/V. The last meal patient remembers eating is a ham and beef sandwich on 10/13. She normally eats 2-3 meals a day - oatmeal or grits for breakfast (doesn't like eggs), sandwiches and meals from meals on wheels. She is not a big eater but is very hungry as of 10/23 and has liked vanilla ensures in the past. She has been on D5LR since 10/20 PM, receiving 90g dextrose /24hr, which will help decrease refeeding risk. Pharmacy consulted for TPN.   Glucose / Insulin: BG <180, used 8 units SSI/24hr; no hx DM Electrolytes: K down 2.9 (s/p diuresis; receiving 80 mEq IV), CoCa 8.8, others wnl Renal: Scr 0.67, BUN 35 Hepatic: Alk phos/AST/AL/Tbili wnl, albumin  2.4, TG 48 Intake / Output; MIVF: UOP 2.4 ml/kg/hr s/p Lasix  60 x2, repeating Lasix  10/31, NGT output up 650 mL, LBM 10/30 x4 s/p Relistor x1, stopped Neostigmine, started Reglan q8h, maintains on bisacodyl  BID, Miralax  to 34g BID  GI Imaging: 10/16 KUB: Diffuse bowel gas distension throughout the abdomen, possible ileus  10/17 CT: Gaseous distention of the colon without discrete focal transition point or mass lesion likely representing ileus 10/20 KUB: Persistent gaseous dilatation of the large bowel 10/21 KUB: moderate diffuse bowel distension, consistent with ileus 10/24 KUB:  Gaseous  distension of large and small bowel consistent with ileus, slightly improved from prior. 10/27 CTAP: diffusely gas filled dilated colon , few narrowed segments > ileus  GI Surgeries / Procedures:  10/24 NGT removed   Central access: PICC 10/23  TPN start date: 10/23  Nutritional Goals: Goal TPN rate with full lipids is 70 mL/hr (provides 90 g of protein and 1705 kcals per day) -meeting 100% needs  Goal TPN rate with reduced lipids (25%) is 70 mL/hr (provides 90 g of protein and 1589 kcals per day) -with Propofol  meeting 100% needs  RD Assessment: Estimated Needs Total Energy Estimated Needs: 1700-1900 kcal/d Total Protein Estimated Needs: 80-95g/d Total Fluid Estimated Needs: 1.8L/d  Current Nutrition:  TPN 10/23 CLD 10/24 FLD- had beef broth, cranberry juice, coffee, chocolate pudding, apple sauce, no Ensure 10/25 Soft - patient in a lot of pain, told RN she doesn't like Ensure and refused. Only had soup and ice cream 10/26 Soft - patient willing to try Ensure  10/27-28 NPO -propofol  @20 -40 mcg/kg/min > 8-16 ml/hr > 211-422 kcal /day  10/29 NPO - propofol  @ 20 mcg/kg/min > 8 ml/hr 10/30 NPO - propofol  @10 -20 mcg/kg/min > 4-8 ml/hr (plan to keep on) 10/31 NPO - propofol  @ 20-30 mcg/kgmin > 8-12 ml/hr   Plan:  Continue TPN at new goal of 70 mL/hr with reduced lipids at 1800, provides 100% estimated needs with propofol  Electrolytes in TPN: Decrease Na to 95 mEq/L, increase K to 28mEq/L, Ca 2 mEq/L, Mg 5 mEq/L, and Phos 10 mmol/L. Cl:Ac 1:1 Per  tube MVI and trace elements   Continue sensitive SSI q4h and monitor CBGs Monitor TPN labs Mon/Thurs and PRN   Harlene Boga, PharmD, BCPS, BCCCP Clinical Pharmacist Please refer to [facility] for [facility] Pharm[facility] numbers 07/17/2024 7:04 AM

## 2024-07-17 NOTE — Progress Notes (Signed)
 [facility] ADULT ICU REPLACEMENT PROTOCOL   The patient does apply for the [facility] Adult ICU Electrolyte Replacment Protocol based on the criteria listed below:   1.Exclusion criteria: TCTS, ECMO, Dialysis, and Myasthenia Gravis patients 2. Is GFR >/= 30 ml/min? Yes.    Patient's GFR today is >60 3. Is SCr </= 2? Yes.   Patient's SCr is 0.77 mg/dL 4. Did SCr increase >/= 0.5 in 24 hours? No. 5.Pt's weight >40kg  Yes.   6. Abnormal electrolyte(s): K+ = 2.9  7. Electrolytes replaced per protocol 8.  Call MD STAT for K+ </= 2.5, Phos </= 1, or Mag </= 1 Physician:  Epimenio, eMD   Marie Johnson Marie Johnson 07/17/2024 5:50 AM

## 2024-07-17 NOTE — Progress Notes (Signed)
 Called husband Mr. Bitterman to update him this afternoon. He does not recall speaking to me earlier this week.  I called daughter Stacy.  Still not making progress with vent weaning-- now worried  with sputum, fevers, leukocytosis that she has something resistant or a new organism. Dr. Marylu had updated her when she was at bedside earlier today.   Marie SHAUNNA Gaskins, DO 07/17/24 4:19 PM [facility]  For contact information, see Amion. If no response to pager, please call PCCM consult pager. After hours, 7PM- 7AM, please call Elink.

## 2024-07-17 NOTE — Plan of Care (Signed)
  Problem: Clinical Measurements: Goal: Ability to maintain clinical measurements within normal limits will improve Outcome: Progressing Goal: Will remain free from infection Outcome: Progressing Goal: Diagnostic test results will improve Outcome: Progressing Goal: Respiratory complications will improve Outcome: Progressing Goal: Cardiovascular complication will be avoided Outcome: Progressing   Problem: Activity: Goal: Risk for activity intolerance will decrease Outcome: Progressing   Problem: Nutrition: Goal: Adequate nutrition will be maintained Outcome: Progressing   Problem: Coping: Goal: Level of anxiety will decrease Outcome: Progressing   Problem: Elimination: Goal: Will not experience complications related to bowel motility Outcome: Progressing Goal: Will not experience complications related to urinary retention Outcome: Progressing   Problem: Pain Managment: Goal: General experience of comfort will improve and/or be controlled Outcome: Progressing   Problem: Safety: Goal: Ability to remain free from injury will improve Outcome: Progressing   Problem: Skin Integrity: Goal: Risk for impaired skin integrity will decrease Outcome: Progressing   Problem: Fluid Volume: Goal: Ability to maintain a balanced intake and output will improve Outcome: Progressing   Problem: Metabolic: Goal: Ability to maintain appropriate glucose levels will improve Outcome: Progressing   Problem: Skin Integrity: Goal: Risk for impaired skin integrity will decrease Outcome: Progressing   Problem: Tissue Perfusion: Goal: Adequacy of tissue perfusion will improve Outcome: Progressing   Problem: Safety: Goal: Non-violent Restraint(s) Outcome: Progressing

## 2024-07-17 NOTE — Progress Notes (Signed)
 PT Cancellation and Discharge Note  Patient Details Name: Marie Johnson MRN: [id number] DOB: Aug 21, 1951   Cancelled Treatment:    Reason Eval/Treat Not Completed: (P) Patient not medically ready. Pt remains intubated, sedated and on pressors. PT Eval has been cancelled the last 3 days, per therapy protocol PT will sign off and complete the order. Please reorder PT when pt is medically stable.   Marie Johnson PT, DPT Acute Rehabilitation Services Please use secure chat or  Call Office [phone]  Marie Johnson [facility] 07/17/2024, 9:53 AM

## 2024-07-17 NOTE — Progress Notes (Addendum)
 PHARMACY - ANTICOAGULATION CONSULT NOTE  Pharmacy Consult for IV heparin   Indication: atrial fibrillation  Allergies  Allergen Reactions   Bee Venom Anaphylaxis and Other (See Comments)    Welts, also    Insect bites- Welts, also   Insect Extract Anaphylaxis and Other (See Comments)    Insect bites- Welts, also   Farxiga  [Dapagliflozin ] Nausea And Vomiting   Lyrica [Pregabalin] Nausea And Vomiting   Neurontin [Gabapentin] Nausea And Vomiting and Other (See Comments)    Hypertension/dizziness   Nsaids Nausea Only and Other (See Comments)    Patient is not suppose to have this class of medication (liver)  Can tolerate 81mg , which she is taking   Latex Rash and Other (See Comments)    Patient Measurements: Height: 5' 7.5 (171.5 cm) Weight: 62.5 kg (137 lb 12.6 oz) IBW/kg (Calculated) : 62.75 HEPARIN  DW (KG): 61.2  Vital Signs: Temp: 97.5 F (36.4 C) (10/31 0741) Temp Source: Axillary (10/31 0741) BP: 120/57 (10/31 0800) Pulse Rate: 76 (10/31 0800)  Labs: Recent Labs    07/15/24 0219 07/15/24 0859 07/16/24 0517 07/16/24 1742 07/17/24 0418  HGB 6.8* 8.5* 8.4*  --  7.6*  HCT 22.4* 27.5* 26.6*  --  24.5*  PLT 295 317 330  --  304  HEPARINUNFRC 0.51  --  0.32 0.36 0.22*  CREATININE 0.75  --  0.67  --  0.77    Estimated Creatinine Clearance: 60 mL/min (by C-G formula based on SCr of 0.77 mg/dL).   Medical History: Past Medical History:  Diagnosis Date   AAA (abdominal aortic aneurysm)    3.4 cm   Anemia    Anxiety    Aortic atherosclerosis    Arthritis    Asthma    CAD (coronary artery disease)    a. Nonobstructive CAD by cath 07/17/13.   Cancer [facility])    Carotid artery disease    Carotid US  8/22: Bilateral ICA 1-39; right subclavian stenosis; right thyroid  nodule   CHD (congenital heart disease)    pt unaware???   Chest pain    a. Adm 10-07/2013 - CTA neg for PE/dissection, cath with nonobstructive disease, CP ?due to uncontrolled HTN vs coronary  vasospasm.   CHF (congestive heart failure) (HCC)    Closed head injury with concussion    COPD (chronic obstructive pulmonary disease) (HCC)    Coronary artery disease    Echocardiogram    Echocardiogram 06/2019: EF 60-65, impaired relaxation (Gr 1 DD), mild MAC, mild MR, mild to mod aortic valve sclerosis (no AS), trivial TR, mild LAE, normal RVSF, RVSP 30.7 (mildly elevated)   GERD (gastroesophageal reflux disease)    History of blood transfusion    childbirth, Hysterectomy   Hyperlipidemia    Hypertension    Neuropathy    OA (osteoarthritis)    Osteoporosis    PAD (peripheral artery disease)    a. s/p L fem-pop bypass b. recent evaluation at [facility], unamenable to intervention   PAD (peripheral artery disease)    Palpitations 02/22/2016   Pneumonia    Pre-diabetes    Premature ventricular contraction    PUD (peptic ulcer disease)    PVD (peripheral vascular disease) 07/17/2013   Thoracic aortic aneurysm    Chest/aorta CTA 04/2022: 14 x 8 focal saccular aneurysm versus penetrating atherosclerotic ulcer involving left side of proximal portion of transverse aortic arch; descending thoracic aorta and proximal abdominal aorta aneurysm 4.4 cm; aortic atherosclerosis; emphysema   Tobacco abuse    Wears dentures  Wears glasses     Medications:  Medications Prior to Admission  Medication Sig Dispense Refill Last Dose/Taking   albuterol  (PROVENTIL  HFA;VENTOLIN  HFA) 108 (90 BASE) MCG/ACT inhaler Inhale 2 puffs into the lungs every 6 (six) hours as needed for wheezing or shortness of breath.   06/29/2024 Evening   alendronate  (FOSAMAX ) 70 MG tablet Take 70 mg by mouth every Wednesday.   Past Week   allopurinol  (ZYLOPRIM ) 300 MG tablet Take 300 mg by mouth every evening.   06/29/2024 Noon   aspirin  EC 81 MG tablet Take 81 mg by mouth in the morning.   06/29/2024 Morning   atorvastatin  (LIPITOR ) 80 MG tablet Take 80 mg by mouth at bedtime.   06/29/2024 Morning   calcium  carbonate (ANTACID)  500 MG chewable tablet Chew 1 tablet by mouth 2 (two) times daily as needed for heartburn or indigestion.   Past Week   Cholecalciferol (VITAMIN D ) 125 MCG (5000 UT) CAPS Take 5,000 Units by mouth in the morning.   06/29/2024 Morning   cyanocobalamin  (VITAMIN B12) 1000 MCG tablet Take 1,000 mcg by mouth daily.   06/29/2024 Morning   Dupilumab  (DUPIXENT ) 300 MG/2ML SOAJ Inject 300 mg into the skin every 14 (fourteen) days. 12 mL 1 Past Week   EPIPEN  2-PAK 0.3 MG/0.3ML SOAJ injection Inject 0.3 mg into the muscle daily as needed for anaphylaxis. Use as directed as needed for allergies.   Taking As Needed   ezetimibe  (ZETIA ) 10 MG tablet Take 1 tablet (10 mg total) by mouth daily. 90 tablet 1 06/28/2024 Evening   fluticasone -salmeterol (WIXELA INHUB) 500-50 MCG/ACT AEPB Inhale 1 puff into the lungs in the morning and at bedtime. 3 each 3 06/29/2024 Morning   furosemide  (LASIX ) 40 MG tablet Take one tablet by mouth twice daily on Mondays, Wednesdays, and Fridays, then take one tablet by mouth daily on other days. 120 tablet 2 06/29/2024 Morning   Oxycodone  HCl 10 MG TABS Take 1 tablet (10 mg total) by mouth 4 (four) times daily as needed (for pain). 20 tablet 0 06/29/2024 Morning   pantoprazole  (PROTONIX ) 40 MG tablet Take 40 mg by mouth 2 (two) times daily.   06/29/2024 Morning   potassium chloride  SA (KLOR-CON  M20) 20 MEQ tablet TAKE 1 TABLET BY MOUTH TWICE DAILY ON MONDAYS, WEDNESDAYS, AND FRIDAYS, THEN TAKE ONE TABLET BY MOUTH DAILY ON OTHER DAYS. 120 tablet 2 06/29/2024 Morning   senna-docusate (SENOKOT-S) 8.6-50 MG tablet Take 2 tablets by mouth at bedtime.   06/28/2024 Bedtime   SPIRIVA  RESPIMAT 2.5 MCG/ACT AERS Take 2 puffs by mouth daily.   06/29/2024 Morning   spironolactone  (ALDACTONE ) 25 MG tablet Take 1 tablet (25 mg total) by mouth daily. 90 tablet 3 06/29/2024 Morning   verapamil  (CALAN -SR) 240 MG CR tablet Take 1 tablet (240 mg total) by mouth daily. 90 tablet 1 06/29/2024 Morning    albuterol  (PROVENTIL ) (2.5 MG/3ML) 0.083% nebulizer solution Take 2.5 mg by nebulization every 6 (six) hours as needed for wheezing or shortness of breath. (Patient not taking: Reported on 06/29/2024)   Not Taking   nitroGLYCERIN  (NITROSTAT ) 0.4 MG SL tablet Place 1 tablet (0.4 mg total) under the tongue every 5 (five) minutes x 3 doses as needed for chest pain. (Patient not taking: Reported on 06/29/2024) 25 tablet 4 Not Taking    Assessment: 73 yo F presenting s/p fall with hip fracture and ileus. Patient with new onset afib this admission. Pharmacy consulted for heparin  dosing. Deferred bolus given concern for bowel  perforation and slight drop in hemoglobin.  Heparin  level subtherapeutic at 0.22. Per RN, no interruptions to the infusion or signs/symptoms of bleeding, but dc heparin  in setting of Hgb drop (8.4>7.6) per MD  Goal of Therapy:  Heparin  level 0.3-0.7 units/ml Monitor platelets by anticoagulation protocol: Yes   Plan:  Stop heparin  infusion   Raisa Ditto, PharmD PGY1 Clinical Pharmacist [facility]  07/17/2024 8:34 AM

## 2024-07-18 DIAGNOSIS — J449 Chronic obstructive pulmonary disease, unspecified: Secondary | ICD-10-CM | POA: Diagnosis not present

## 2024-07-18 DIAGNOSIS — K567 Ileus, unspecified: Secondary | ICD-10-CM | POA: Diagnosis not present

## 2024-07-18 DIAGNOSIS — J9621 Acute and chronic respiratory failure with hypoxia: Secondary | ICD-10-CM | POA: Diagnosis not present

## 2024-07-18 DIAGNOSIS — J690 Pneumonitis due to inhalation of food and vomit: Secondary | ICD-10-CM | POA: Diagnosis not present

## 2024-07-18 LAB — CBC
HCT: 23.4 % — ABNORMAL LOW (ref 36.0–46.0)
Hemoglobin: 7.1 g/dL — ABNORMAL LOW (ref 12.0–15.0)
MCH: 30.7 pg (ref 26.0–34.0)
MCHC: 30.3 g/dL (ref 30.0–36.0)
MCV: 101.3 fL — ABNORMAL HIGH (ref 80.0–100.0)
Platelets: 237 K/uL (ref 150–400)
RBC: 2.31 MIL/uL — ABNORMAL LOW (ref 3.87–5.11)
RDW: 25.6 % — ABNORMAL HIGH (ref 11.5–15.5)
WBC: 11.9 K/uL — ABNORMAL HIGH (ref 4.0–10.5)
nRBC: 0.3 % — ABNORMAL HIGH (ref 0.0–0.2)

## 2024-07-18 LAB — GLUCOSE, CAPILLARY
Glucose-Capillary: 162 mg/dL — ABNORMAL HIGH (ref 70–99)
Glucose-Capillary: 148 mg/dL — ABNORMAL HIGH (ref 70–99)
Glucose-Capillary: 154 mg/dL — ABNORMAL HIGH (ref 70–99)
Glucose-Capillary: 142 mg/dL — ABNORMAL HIGH (ref 70–99)
Glucose-Capillary: 141 mg/dL — ABNORMAL HIGH (ref 70–99)
Glucose-Capillary: 166 mg/dL — ABNORMAL HIGH (ref 70–99)

## 2024-07-18 LAB — BASIC METABOLIC PANEL WITH GFR
Anion gap: 11 (ref 5–15)
BUN: 35 mg/dL — ABNORMAL HIGH (ref 8–23)
CO2: 27 mmol/L (ref 22–32)
Calcium: 7.5 mg/dL — ABNORMAL LOW (ref 8.9–10.3)
Chloride: 113 mmol/L — ABNORMAL HIGH (ref 98–111)
Creatinine, Ser: 0.8 mg/dL (ref 0.44–1.00)
GFR, Estimated: >60 mL/min (ref >60)
Glucose, Bld: 162 mg/dL — ABNORMAL HIGH (ref 70–99)
Potassium: 3.5 mmol/L (ref 3.5–5.1)
Sodium: 151 mmol/L — ABNORMAL HIGH (ref 135–145)

## 2024-07-18 LAB — MAGNESIUM: Magnesium: 2.2 mg/dL (ref 1.7–2.4)

## 2024-07-18 LAB — PHOSPHORUS: Phosphorus: 2.6 mg/dL (ref 2.5–4.6)

## 2024-07-18 MED ORDER — SODIUM CHLORIDE 3 % IN NEBU
4.0000 mL | INHALATION_SOLUTION | Freq: Four times a day (QID) | RESPIRATORY_TRACT | Status: DC
Start: 2024-07-18 — End: 2024-07-23
  Administered 2024-07-18 – 2024-07-20 (×7): 4 mL via RESPIRATORY_TRACT
  Filled 2024-07-18 (×8): qty 15

## 2024-07-18 MED ORDER — POTASSIUM CHLORIDE 10 MEQ/50ML IV SOLN
10.0000 meq | INTRAVENOUS | Status: AC
  Administered 2024-07-18 (×4): 10 meq via INTRAVENOUS
  Filled 2024-07-18 (×4): qty 50

## 2024-07-18 MED ORDER — METOCLOPRAMIDE HCL 5 MG/ML IJ SOLN
10.0000 mg | Freq: Three times a day (TID) | INTRAMUSCULAR | Status: AC
  Administered 2024-07-18 – 2024-07-19 (×3): 10 mg via INTRAVENOUS
  Filled 2024-07-18 (×3): qty 2

## 2024-07-18 MED ORDER — SODIUM CHLORIDE 3 % IN NEBU
4.0000 mL | INHALATION_SOLUTION | RESPIRATORY_TRACT | Status: DC
  Administered 2024-07-18: 4 mL via RESPIRATORY_TRACT

## 2024-07-18 MED ORDER — FUROSEMIDE 10 MG/ML IJ SOLN
40.0000 mg | Freq: Once | INTRAMUSCULAR | Status: AC
  Administered 2024-07-18: 40 mg via INTRAVENOUS
  Filled 2024-07-18: qty 4

## 2024-07-18 MED ORDER — TRAVASOL 10 % IV SOLN
INTRAVENOUS | Status: AC
  Filled 2024-07-18: qty 907.2

## 2024-07-18 MED ORDER — POTASSIUM CHLORIDE 20 MEQ PO PACK: 40.0000 meq | PACK | Freq: Once | ORAL | Status: DC

## 2024-07-18 NOTE — Progress Notes (Addendum)
 PHARMACY - TOTAL PARENTERAL NUTRITION CONSULT NOTE   Indication: Prolonged ileus  Patient Measurements: Height: 5' 7.5 (171.5 cm) Weight: 62.5 kg (137 lb 12.6 oz) IBW/kg (Calculated) : 62.75 TPN AdjBW (KG): 61.2 Body mass index is 21.26 kg/m. Usual Weight: 140lbs per pt, 135 lbs per chart review   Assessment:  73 yo W admitted with mechanical fall and right femur fracture 10/13 and then found to have ileus on KUB 10/16. Pt was switched to clear liquid diet 10/17-10/20 but vomited what she tried drink. Patient has been NPO since so has had no intake for 7 days. She had N/V and required NG tube placement 10/20 PM with brown particulate output and improvement in N/V. The last meal patient remembers eating is a ham and beef sandwich on 10/13. She normally eats 2-3 meals a day - oatmeal or grits for breakfast (doesn't like eggs), sandwiches and meals from meals on wheels. She is not a big eater but is very hungry as of 10/23 and has liked vanilla ensures in the past. She has been on D5LR since 10/20 PM, receiving 90g dextrose /24hr, which will help decrease refeeding risk. Pharmacy consulted for TPN.   Glucose / Insulin: BG <180, used 8 units SSI/24hr; no hx DM Electrolytes: Na up 151 (reducing in TPN), K up 3.5 (receiving 40mEq IV this AM), Phos trend down 2.6, CoCa 8.8, Cl slightly up, CO2 wnl - will watch Renal: Scr and BUN stable Hepatic: Alk phos/AST/AL/Tbili wnl, albumin  2.4, TG 48 (10/30) Intake / Output; MIVF: UOP 1.6 ml/kg/hr s/p Lasix  60 x1 yesterday, NGT output up , stool (goal very soft-liquid stool), stopped Neostigmine, started Reglan q8h, maintains on Miralax  to 34g BID  GI Imaging: 10/16 KUB: Diffuse bowel gas distension throughout the abdomen, possible ileus  10/17 CT: Gaseous distention of the colon without discrete focal transition point or mass lesion likely representing ileus 10/20 KUB: Persistent gaseous dilatation of the large bowel 10/21 KUB: moderate diffuse  bowel distension, consistent with ileus 10/24 KUB:  Gaseous distension of large and small bowel consistent with ileus, slightly improved from prior. 10/27 CTAP: diffusely gas filled dilated colon , few narrowed segments > ileus  GI Surgeries / Procedures:  10/24 NGT removed   Central access: PICC 10/23  TPN start date: 10/23  Nutritional Goals: Goal TPN rate with full lipids is 70 mL/hr (provides 90 g of protein and 1705 kcals per day) -meeting 100% needs  Goal TPN rate with reduced lipids (25%) is 70 mL/hr (provides 90 g of protein and 1589 kcals per day) -with Propofol  meeting 100% needs  RD Assessment: Estimated Needs Total Energy Estimated Needs: 1700-1900 kcal/d Total Protein Estimated Needs: 80-95g/d Total Fluid Estimated Needs: 1.8L/d  Current Nutrition:  TPN 10/23 CLD 10/24 FLD- had beef broth, cranberry juice, coffee, chocolate pudding, apple sauce, no Ensure 10/25 Soft - patient in a lot of pain, told RN she doesn't like Ensure and refused. Only had soup and ice cream 10/26 Soft - patient willing to try Ensure  10/27-28 NPO -propofol  @20 -40 mcg/kg/min > 8-16 ml/hr > 211-422 kcal /day  10/29 NPO - propofol  @ 20 mcg/kg/min > 8 ml/hr 10/30 NPO - propofol  @10 -20 mcg/kg/min > 4-8 ml/hr (plan to keep on) 10/31-11/1 NPO - propofol  @ 20-30 mcg/kgmin > 8-12 ml/hr   Plan:  Continue TPN at new goal of 70 mL/hr with reduced lipids at 1800, provides 100% estimated needs with propofol  Electrolytes in TPN: Decrease Na to 50 mEq/L, increase K to 50mEq/L, Ca 2  mEq/L, Mg 5 mEq/L, and increase Phos to 12 mmol/L. Cl:Ac 1:1 Per tube MVI and trace elements   Continue sensitive SSI q4h and monitor CBGs Monitor TPN labs Mon/Thurs and PRN   Harlene Boga, PharmD, BCPS, BCCCP Clinical Pharmacist Please refer to [facility] for [facility] Pharmacy numbers 07/18/2024 7:03 AM

## 2024-07-18 NOTE — Progress Notes (Signed)
 [facility] ADULT ICU REPLACEMENT PROTOCOL   The patient does apply for the [facility] Adult ICU Electrolyte Replacment Protocol based on the criteria listed below:   1.Exclusion criteria: TCTS, ECMO, Dialysis, and Myasthenia Gravis patients 2. Is GFR >/= 30 ml/min? Yes.    Patient's GFR today is >60 3. Is SCr </= 2? Yes.   Patient's SCr is 0.80 mg/dL 4. Did SCr increase >/= 0.5 in 24 hours? No. 5.Pt's weight >40kg  Yes.   6. Abnormal electrolyte(s): K+ = 3.5  7. Electrolytes replaced per protocol 8.  Call MD STAT for K+ </= 2.5, Phos </= 1, or Mag </= 1 Physician:  Epimenio, eMD   Marie Johnson 07/18/2024 5:46 AM

## 2024-07-18 NOTE — Plan of Care (Signed)
  Problem: Activity: Goal: Risk for activity intolerance will decrease Outcome: Progressing   Problem: Clinical Measurements: Goal: Ability to maintain clinical measurements within normal limits will improve Outcome: Progressing Goal: Will remain free from infection Outcome: Progressing Goal: Diagnostic test results will improve Outcome: Progressing Goal: Respiratory complications will improve Outcome: Progressing Goal: Cardiovascular complication will be avoided Outcome: Progressing   Problem: Elimination: Goal: Will not experience complications related to bowel motility Outcome: Progressing Goal: Will not experience complications related to urinary retention Outcome: Progressing   Problem: Pain Managment: Goal: General experience of comfort will improve and/or be controlled Outcome: Progressing   Problem: Safety: Goal: Ability to remain free from injury will improve Outcome: Progressing   Problem: Skin Integrity: Goal: Risk for impaired skin integrity will decrease Outcome: Progressing   Problem: Coping: Goal: Ability to adjust to condition or change in health will improve Outcome: Progressing   Problem: Metabolic: Goal: Ability to maintain appropriate glucose levels will improve Outcome: Progressing   Problem: Tissue Perfusion: Goal: Adequacy of tissue perfusion will improve Outcome: Progressing   Problem: Safety: Goal: Non-violent Restraint(s) Outcome: Progressing

## 2024-07-18 NOTE — Progress Notes (Signed)
 RT note. Patient placed on SBT at this time with the following/previous settings. Patient sat 100% with no labored breathing at this time. RT will continue to monitor.    07/18/24 0722  Vent Select  Invasive or Noninvasive Invasive  Adult Vent Y  Airway 7.5 mm  Placement Date/Time: 07/12/24 1401   Difficult airway due to:: Difficulty was anticipated  Grade View: Grade 2  Placed By: ICU physician  Airway Device: Endotracheal Tube  ETT Types: Endobronchial  Size (mm): 7.5 mm  Cuffed: Cuffed  Insertion attempts...  Secured at (cm) 23 cm  Measured From Lips  Secured Location Left  Secured By English As A Second Language Teacher No  Tube Holder Repositioned Yes  Prone position No  Cuff Pressure (cm H2O) Clear OR 27-39 CmH2O  Site Condition Dry  Adult Ventilator Settings  Vent Type Servo i  Humidity HME  Vent Mode (S)  PSV;CPAP  FiO2 (%) 40 %  Pressure Support 10 cmH20  PEEP 8 cmH20  Adult Ventilator Measurements  Peak Airway Pressure 18 L/min  Mean Airway Pressure 12 cmH20  Resp Rate Spontaneous 25 br/min  Resp Rate Total 25 br/min  Spont TV 453 mL  Measured Ve 11 L  Auto PEEP 0 cmH20  Total PEEP 8 cmH20  SpO2 100 %  Adult Ventilator Alarms  Alarms On Y  Ve High Alarm 21 L/min  Ve Low Alarm 4 L/min  Resp Rate High Alarm 38 br/min  Resp Rate Low Alarm 10  PEEP Low Alarm 6 cmH2O  Press High Alarm 40 cmH2O  T Apnea 20 sec(s)  VAP Prevention  HOB> 30 Degrees Y  Equipment wiped down Yes  Daily Weaning Assessment  Daily Assessment of Readiness to Wean Wean protocol criteria met (SBT performed)  SBT Method CPAP 5 cm H20 and PS 5 cm H20  Weaning Start Time 0722  Breath Sounds  Bilateral Breath Sounds Clear  R Upper  Breath Sounds Clear  L Upper Breath Sounds Clear  R Lower Breath Sounds Diminished  L Lower Breath Sounds Diminished  Airway Suctioning/Secretions  Suction Type ETT  Suction Device  Catheter  Secretion Amount Small  Secretion Color Tan  Secretion Consistency  Thick  Suction Tolerance Tolerated well  Suctioning Adverse Effects None

## 2024-07-18 NOTE — Progress Notes (Signed)
 NAME:  Marie Johnson, MRN:  [id number], DOB:  Mar 18, 1951, LOS: 19 ADMISSION DATE:  06/29/2024, CONSULTATION DATE:  07/12/2024 REFERRING MD:  TRH, CHIEF COMPLAINT:   Acute respiratory distress  History of Present Illness:  Marie Johnson is a 73 y.o. female with a past medical history significant for but not limited to CHF, COPD, chronic hypoxic respiratory failure on 2 L nasal cannula at baseline, HTN, HLD, PAD, osteoporosis, and anxiety initially presented to the ED at [facility] 10/13 after suffering mechanical fall resulting in right periprosthetic femur fracture.  Since admission patient has had several complications, see timeline below  Pertinent  Medical History  CHF, COPD, chronic hypoxic respiratory failure on 2 L nasal cannula at baseline, HTN, HLD, PAD, osteoporosis, and anxiety  Significant Hospital Events: Including procedures, antibiotic start and stop dates in addition to other pertinent events   10/13 presented after suffering mechanical fall at home resulting in acute periprosthetic right femur fracture 10/16 abdominal distention noticed, KUB obtained 10/17 colonic ileus confirmed 10/18 GI consulted recommended medical management 10/21 chest x-ray with patchy densities in the right lung concerning for aspiration pneumonia in the setting of ileus 10/23 TPN started via PICC line 10/25 developed worsening respiratory distress with change in mentation transferred to ICU and emergently admitted  Interim History / Subjective:  Overnight no acute events. Tmax 101.2.   Objective    Blood pressure (!) 153/59, pulse 65, temperature 99 F (37.2 C), temperature source Axillary, resp. rate (!) 24, height 5' 7.5 (1.715 m), weight 62.5 kg, SpO2 100%.    Vent Mode: PRVC FiO2 (%):  [40 %-50 %] 40 % Set Rate:  [24 bmp] 24 bmp Vt Set:  [500 mL] 500 mL PEEP:  [8 cmH20] 8 cmH20 Pressure Support:  [10 cmH20] 10 cmH20 Plateau Pressure:  [20 cmH20-21 cmH20] 20 cmH20   Intake/Output  Summary (Last 24 hours) at 07/18/2024 0719 Last data filed at 07/18/2024 0546 Gross per 24 hour  Intake 3716.04 ml  Output 3240 ml  Net 476.04 ml   Filed Weights   07/03/24 2004 07/10/24 0500 07/16/24 0200  Weight: 61.2 kg 66.7 kg 62.5 kg    Examination: General:  critically ill appearing woman lying in bed in NAD HEENT: [place]/AT, eyes anicteric Neuro: RASS -1, able to nod to answer y/n questions. Gloablly weak but able to move extremities. Moderate strength cough. CV: S1S2, RRR, NSR on tele.  PULM: breathing comfortably on MV, thick tan secretions GI: soft, less distended today Extremities: improving but persistent edema  NGT output 700cc I/O +700cc, net +7.5L for admission  Na+ 151 K+ 3.5 BUN 35 Cr 0.80 WBC 11.9 H/H  7.1/23.4 Platelets 237 Resp culture > abundant PMN, mod GNR   Resolved problem list   Assessment and Plan  Acute on chronic hypoxic respiratory failure on 2 L nasal cannula at baseline due to history of COPD- -10/26 emergently intubated due to worsening respiratory distress Aspiration pneumonia RLL- Enterobacter -recultured sputum yesterday; awaiting results  -con't cefepime; can adjust based on culture results when available -LTVV -VAP prevention protocol -PAD protocol for sedation -daily SAT & SBT as appropriate -con't bronchodilators- brovana & yupelri -CPT, 3% saline nebs  Worsened leukocytosis, new fevers. Worry about repeat aspiration pneumonia with worsening sputum. -sputum pending -con't cefepime; vanc stopped based on gram stain.  -still needs PICC for TPN -unfortunately still needs foley for moisture-associated skin breakdown from liquid stool   Prolonged ongoing colonic ileus - Developed pretty early on stay given  opioid use during acute fracture, immobility.  TPN started 10/23. Per family, had a history of chronic constipation and used laxatives chronically. Still has high NGT output. -reglan for another day -con't OGT to LIWS; want to  avoid any additional pulmonary compromise -avoiding all opiates as much as possible -mobility is unfortunately very limited -TPN, PICC for nutrition -holding miralax  while NGT output is so high  New onset A-fib RVR, due to critical illness. Rate control improved today. -con't amiodarone> if going back into RVR may need to switch back to IV -heparin  on hold with concern for bleeding -tele monitoring -monitor electrolytes and replete as needed  Acute periprosthetic right femur fracture -per 10/4 ortho note, nonoperative management. WBAT.  -needs OP follow up with [facility] -nonopiate pain control preferred  Anemia of chronic disease; although hemoglobin was low again today this was same without getting blood products in between.  With drop in Hb last 24 hrs despite being net negative volume status, worry more about occult bleeidng B12 deficiency -monitor; no need for transfusion today but may need another one again tomorrow -holding heparin  still -monitor for bleeding> so far haven't seen any  Chronic HFpEF History of CAD-Status post [facility] July 2024 with mild to moderate nonobstructive disease was previously on DAPT for PAD Hyperlipidemia Essential hypertension PAD -lasix  40mg  today -aspirin , statin daily> holding while high OG output -con't holding PTA antihypertensives  Husband and daughter Stacy updated at bedside today.   Labs   CBC: Recent Labs  Lab 07/15/24 0859 07/16/24 0517 07/17/24 0418 07/17/24 1518 07/18/24 0053  WBC 12.6* 18.3* 18.1* 16.4* 11.9*  HGB 8.5* 8.4* 7.6* 7.4* 7.1*  HCT 27.5* 26.6* 24.5* 24.4* 23.4*  MCV 98.9 98.9 99.2 103.4* 101.3*  PLT 317 330 304 268 237    Basic Metabolic Panel: Recent Labs  Lab 07/14/24 0339 07/15/24 0219 07/16/24 0517 07/17/24 0418 07/18/24 0053  NA 139 139 144 148* 151*  K 4.2 4.8 4.0 2.9* 3.5  CL 108 104 108 110 113*  CO2 23 25 25 28 27   GLUCOSE 127* 121* 144* 134* 162*  BUN 29* 33* 35* 32* 35*   CREATININE 0.71 0.75 0.67 0.77 0.80  CALCIUM  8.0* 8.0* 7.8* 7.5* 7.5*  MG 2.3 2.6* 2.4 2.1 2.2  PHOS 3.7 3.7 3.4 2.9 2.6   GFR: Estimated Creatinine Clearance: 60 mL/min (by C-G formula based on SCr of 0.8 mg/dL). Recent Labs  Lab 07/12/24 1422 07/12/24 1752 07/13/24 0310 07/16/24 0517 07/17/24 0418 07/17/24 1518 07/18/24 0053  WBC  --   --    < > 18.3* 18.1* 16.4* 11.9*  LATICACIDVEN 2.5* 1.3  --   --   --   --   --    < > = values in this interval not displayed.    Liver Function Tests: Recent Labs  Lab 07/11/24 0850 07/12/24 0609  AST  --  19  ALT  --  12  ALKPHOS  --  74  BILITOT  --  0.8  PROT  --  6.3*  ALBUMIN  2.3* 2.4*   Critical care time:     This patient is critically ill with multiple organ system failure which requires frequent high complexity decision making, assessment, support, evaluation, and titration of therapies. This was completed through the application of advanced monitoring technologies and extensive interpretation of multiple databases. During this encounter critical care time was devoted to patient care services described in this note for 41 minutes.  Leita SHAUNNA Gaskins, DO 07/18/24 4:53 PM   Pulmonary & Critical Care  For contact information, see Amion. If no response to pager, please call PCCM consult pager. After hours, 7PM- 7AM, please call Elink.

## 2024-07-18 NOTE — Plan of Care (Signed)
  Problem: Education: Goal: Knowledge of General Education information will improve Description: Including pain rating scale, medication(s)/side effects and non-pharmacologic comfort measures Outcome: Progressing   Problem: Clinical Measurements: Goal: Ability to maintain clinical measurements within normal limits will improve Outcome: Progressing   Problem: Nutrition: Goal: Adequate nutrition will be maintained Outcome: Progressing   Problem: Pain Managment: Goal: General experience of comfort will improve and/or be controlled Outcome: Progressing   Problem: Safety: Goal: Ability to remain free from injury will improve Outcome: Progressing

## 2024-07-18 DEATH — deceased

## 2024-07-19 ENCOUNTER — Inpatient Hospital Stay (HOSPITAL_COMMUNITY)

## 2024-07-19 DIAGNOSIS — J9621 Acute and chronic respiratory failure with hypoxia: Secondary | ICD-10-CM | POA: Diagnosis not present

## 2024-07-19 DIAGNOSIS — J690 Pneumonitis due to inhalation of food and vomit: Secondary | ICD-10-CM | POA: Diagnosis not present

## 2024-07-19 DIAGNOSIS — K567 Ileus, unspecified: Secondary | ICD-10-CM | POA: Diagnosis not present

## 2024-07-19 DIAGNOSIS — J449 Chronic obstructive pulmonary disease, unspecified: Secondary | ICD-10-CM | POA: Diagnosis not present

## 2024-07-19 LAB — POCT I-STAT 7, (LYTES, BLD GAS, ICA,H+H)
Acid-Base Excess: 2 mmol/L (ref 0.0–2.0)
Bicarbonate: 26.5 mmol/L (ref 20.0–28.0)
Calcium, Ion: 1.23 mmol/L (ref 1.15–1.40)
HCT: 22 % — ABNORMAL LOW (ref 36.0–46.0)
Hemoglobin: 7.5 g/dL — ABNORMAL LOW (ref 12.0–15.0)
O2 Saturation: 99 %
Patient temperature: 98.8
Potassium: 4.2 mmol/L (ref 3.5–5.1)
Sodium: 156 mmol/L — ABNORMAL HIGH (ref 135–145)
TCO2: 28 mmol/L (ref 22–32)
pCO2 arterial: 40.2 mmHg (ref 32–48)
pH, Arterial: 7.427 (ref 7.35–7.45)
pO2, Arterial: 121 mmHg — ABNORMAL HIGH (ref 83–108)

## 2024-07-19 LAB — BASIC METABOLIC PANEL WITH GFR
Anion gap: 11 (ref 5–15)
BUN: 31 mg/dL — ABNORMAL HIGH (ref 8–23)
CO2: 26 mmol/L (ref 22–32)
Calcium: 8 mg/dL — ABNORMAL LOW (ref 8.9–10.3)
Chloride: 117 mmol/L — ABNORMAL HIGH (ref 98–111)
Creatinine, Ser: 0.71 mg/dL (ref 0.44–1.00)
GFR, Estimated: >60 mL/min (ref >60)
Glucose, Bld: 172 mg/dL — ABNORMAL HIGH (ref 70–99)
Potassium: 3.4 mmol/L — ABNORMAL LOW (ref 3.5–5.1)
Sodium: 154 mmol/L — ABNORMAL HIGH (ref 135–145)

## 2024-07-19 LAB — CBC
HCT: 22.3 % — ABNORMAL LOW (ref 36.0–46.0)
Hemoglobin: 6.7 g/dL — CL (ref 12.0–15.0)
MCH: 30.7 pg (ref 26.0–34.0)
MCHC: 30 g/dL (ref 30.0–36.0)
MCV: 102.3 fL — ABNORMAL HIGH (ref 80.0–100.0)
Platelets: 220 K/uL (ref 150–400)
RBC: 2.18 MIL/uL — ABNORMAL LOW (ref 3.87–5.11)
RDW: 25.6 % — ABNORMAL HIGH (ref 11.5–15.5)
WBC: 7.3 K/uL (ref 4.0–10.5)
nRBC: 0.3 % — ABNORMAL HIGH (ref 0.0–0.2)

## 2024-07-19 LAB — PREPARE RBC (CROSSMATCH)

## 2024-07-19 LAB — MAGNESIUM: Magnesium: 2.3 mg/dL (ref 1.7–2.4)

## 2024-07-19 LAB — TRIGLYCERIDES: Triglycerides: 84 mg/dL (ref <150)

## 2024-07-19 LAB — GLUCOSE, CAPILLARY
Glucose-Capillary: 151 mg/dL — ABNORMAL HIGH (ref 70–99)
Glucose-Capillary: 155 mg/dL — ABNORMAL HIGH (ref 70–99)
Glucose-Capillary: 140 mg/dL — ABNORMAL HIGH (ref 70–99)
Glucose-Capillary: 156 mg/dL — ABNORMAL HIGH (ref 70–99)
Glucose-Capillary: 159 mg/dL — ABNORMAL HIGH (ref 70–99)
Glucose-Capillary: 151 mg/dL — ABNORMAL HIGH (ref 70–99)

## 2024-07-19 LAB — PHOSPHORUS: Phosphorus: 2.8 mg/dL (ref 2.5–4.6)

## 2024-07-19 MED ORDER — FENTANYL CITRATE (PF) 50 MCG/ML IJ SOSY
50.0000 ug | PREFILLED_SYRINGE | INTRAMUSCULAR | Status: DC | PRN
  Administered 2024-07-20: 100 ug via INTRAVENOUS
  Filled 2024-07-19: qty 2
  Filled 2024-07-19: qty 4

## 2024-07-19 MED ORDER — MIDAZOLAM HCL (PF) 2 MG/2ML IJ SOLN
1.0000 mg | Freq: Once | INTRAMUSCULAR | Status: AC
  Administered 2024-07-19: 1 mg via INTRAVENOUS

## 2024-07-19 MED ORDER — SODIUM CHLORIDE 0.9% IV SOLUTION: Freq: Once | INTRAVENOUS | Status: AC

## 2024-07-19 MED ORDER — SODIUM CHLORIDE 0.9 % IV SOLN
2.0000 g | Freq: Two times a day (BID) | INTRAVENOUS | Status: DC
  Administered 2024-07-20 (×2): 2 g via INTRAVENOUS
  Filled 2024-07-19 (×2): qty 12.5

## 2024-07-19 MED ORDER — POTASSIUM CHLORIDE 20 MEQ PO PACK
40.0000 meq | PACK | Freq: Once | ORAL | Status: AC
  Administered 2024-07-19: 40 meq
  Filled 2024-07-19: qty 2

## 2024-07-19 MED ORDER — FENTANYL CITRATE (PF) 50 MCG/ML IJ SOSY
PREFILLED_SYRINGE | INTRAMUSCULAR | Status: AC
  Filled 2024-07-19: qty 2

## 2024-07-19 MED ORDER — ACETAMINOPHEN 650 MG RE SUPP: 650.0000 mg | RECTAL | Status: DC | PRN

## 2024-07-19 MED ORDER — ROCURONIUM BROMIDE 10 MG/ML (PF) SYRINGE
PREFILLED_SYRINGE | INTRAVENOUS | Status: AC
  Filled 2024-07-19: qty 10

## 2024-07-19 MED ORDER — PROPOFOL 1000 MG/100ML IV EMUL
INTRAVENOUS | Status: AC
  Filled 2024-07-19: qty 100

## 2024-07-19 MED ORDER — TRAVASOL 10 % IV SOLN
INTRAVENOUS | Status: AC
  Filled 2024-07-19: qty 907.2

## 2024-07-19 MED ORDER — MIDAZOLAM HCL 2 MG/2ML IJ SOLN
INTRAMUSCULAR | Status: AC
  Filled 2024-07-19: qty 2

## 2024-07-19 MED ORDER — FUROSEMIDE 10 MG/ML IJ SOLN
40.0000 mg | Freq: Once | INTRAMUSCULAR | Status: AC
  Administered 2024-07-19: 40 mg via INTRAVENOUS
  Filled 2024-07-19: qty 4

## 2024-07-19 MED ORDER — PHENYLEPHRINE 80 MCG/ML (10ML) SYRINGE FOR IV PUSH (FOR BLOOD PRESSURE SUPPORT)
PREFILLED_SYRINGE | INTRAVENOUS | Status: AC
  Filled 2024-07-19: qty 10

## 2024-07-19 MED ORDER — METOCLOPRAMIDE HCL 5 MG/ML IJ SOLN
10.0000 mg | Freq: Three times a day (TID) | INTRAMUSCULAR | Status: AC
  Administered 2024-07-19 – 2024-07-20 (×3): 10 mg via INTRAVENOUS
  Filled 2024-07-19 (×3): qty 2

## 2024-07-19 MED ORDER — KETAMINE HCL 50 MG/5ML IJ SOSY
PREFILLED_SYRINGE | INTRAMUSCULAR | Status: AC
  Filled 2024-07-19: qty 10

## 2024-07-19 MED ORDER — POLYETHYLENE GLYCOL 3350 17 G PO PACK: 17.0000 g | PACK | Freq: Every day | ORAL | Status: DC

## 2024-07-19 MED ORDER — ORAL CARE MOUTH RINSE
15.0000 mL | OROMUCOSAL | Status: DC
  Administered 2024-07-19 – 2024-07-20 (×13): 15 mL via OROMUCOSAL

## 2024-07-19 MED ORDER — ETOMIDATE 2 MG/ML IV SOLN
INTRAVENOUS | Status: AC
  Filled 2024-07-19: qty 20

## 2024-07-19 MED ORDER — PROPOFOL 1000 MG/100ML IV EMUL
0.0000 ug/kg/min | INTRAVENOUS | Status: DC
  Administered 2024-07-19 (×2): 30 ug/kg/min via INTRAVENOUS
  Administered 2024-07-19 – 2024-07-20 (×3): 40 ug/kg/min via INTRAVENOUS
  Filled 2024-07-19 (×4): qty 100

## 2024-07-19 MED ORDER — ORAL CARE MOUTH RINSE: 15.0000 mL | OROMUCOSAL | Status: DC | PRN

## 2024-07-19 MED ORDER — ROCURONIUM BROMIDE 10 MG/ML (PF) SYRINGE
1.0000 mg/kg | PREFILLED_SYRINGE | Freq: Once | INTRAVENOUS | Status: AC
  Administered 2024-07-19: 55.3 mg via INTRAVENOUS

## 2024-07-19 MED ORDER — DEXMEDETOMIDINE HCL IN NACL 400 MCG/100ML IV SOLN
0.0000 ug/kg/h | INTRAVENOUS | Status: DC
  Administered 2024-07-19 (×3): 1.2 ug/kg/h via INTRAVENOUS
  Administered 2024-07-20: 1 ug/kg/h via INTRAVENOUS
  Administered 2024-07-20 (×2): 1.1 ug/kg/h via INTRAVENOUS
  Filled 2024-07-19 (×5): qty 100

## 2024-07-19 MED ORDER — FENTANYL CITRATE (PF) 50 MCG/ML IJ SOSY
50.0000 ug | PREFILLED_SYRINGE | INTRAMUSCULAR | Status: DC | PRN
  Administered 2024-07-20 (×2): 50 ug via INTRAVENOUS

## 2024-07-19 MED ORDER — KETAMINE HCL 50 MG/5ML IJ SOSY
0.5000 mg/kg | PREFILLED_SYRINGE | Freq: Once | INTRAMUSCULAR | Status: AC
  Administered 2024-07-19: 28 mg via INTRAVENOUS

## 2024-07-19 MED ORDER — DOCUSATE SODIUM 50 MG/5ML PO LIQD: 100.0000 mg | Freq: Two times a day (BID) | ORAL | Status: DC

## 2024-07-19 NOTE — Progress Notes (Signed)
 PHARMACY NOTE:  ANTIMICROBIAL RENAL DOSAGE ADJUSTMENT  Current antimicrobial regimen includes a mismatch between antimicrobial dosage and estimated renal function.  As per policy approved by the Pharmacy & Therapeutics and Medical Executive Committees, the antimicrobial dosage will be adjusted accordingly.  Current antimicrobial dosage:  Cefepime 2g IV every 8 hours  Indication: Pneumonia  Renal Function:  Estimated Creatinine Clearance: 53 mL/min (by C-G formula based on SCr of 0.71 mg/dL). []      On intermittent HD, scheduled: []      On CRRT    Antimicrobial dosage has been changed to:  Cefepime 2g IV every 12 hours  Additional comments:   Thank you for allowing pharmacy to be a part of this patient's care.  Marie Johnson, [facility] 07/19/2024 3:50 PM

## 2024-07-19 NOTE — IPAL (Signed)
  Interdisciplinary Goals of Care Family Meeting   Date carried out:: 07/19/2024  Location of the meeting: Bedside  Member's involved: Physician, Bedside Registered Nurse, and Family Member or next of kin  Durable Power of Attorney or acting medical decision maker: son Beryl    Discussion: We discussed goals of care for [employer] .  I met with Ms. Kallstrom's son and DIL at bedside. They are concerned that she has not turned around over the last week. He thinks she is too frail to recover, and although he feared this before she was intubated, he was ok with short term intubation. He feels that her chances of recovery of very low and thinks she would want comfort focused care. We discussed that I had spoken to her husband and sister Stacy and had not gotten that message from them. He confirmed there is no advanced directive. He is planning on talking to his 2 sisters and going to speak with their step-father tonight to discuss her goals of care. We decided to hold off on her CT for now given the potential for change in direction of her care.   Code status: Full Code-- not addressed; need to talk to husband  Disposition: Continue current acute care   Time spent for the meeting: 20 min.  Leita SHAUNNA Gaskins 07/19/2024, 4:18 PM

## 2024-07-19 NOTE — Progress Notes (Addendum)
 NAME:  Marie Johnson, MRN:  [id number], DOB:  1951-03-31, LOS: 20 ADMISSION DATE:  06/29/2024, CONSULTATION DATE:  07/12/2024 REFERRING MD:  TRH, CHIEF COMPLAINT:   Acute respiratory distress  History of Present Illness:  Marie Johnson is a 73 y.o. female with a past medical history significant for but not limited to CHF, COPD, chronic hypoxic respiratory failure on 2 L nasal cannula at baseline, HTN, HLD, PAD, osteoporosis, and anxiety initially presented to the ED at [facility] 10/13 after suffering mechanical fall resulting in right periprosthetic femur fracture.  Since admission patient has had several complications, see timeline below  Pertinent  Medical History  CHF, COPD, chronic hypoxic respiratory failure on 2 L nasal cannula at baseline, HTN, HLD, PAD, osteoporosis, and anxiety  Significant Hospital Events: Including procedures, antibiotic start and stop dates in addition to other pertinent events   10/13 presented after suffering mechanical fall at home resulting in acute periprosthetic right femur fracture 10/16 abdominal distention noticed, KUB obtained 10/17 colonic ileus confirmed 10/18 GI consulted recommended medical management 10/21 chest x-ray with patchy densities in the right lung concerning for aspiration pneumonia in the setting of ileus 10/23 TPN started via PICC line 10/25 developed worsening respiratory distress with change in mentation transferred to ICU and emergently admitted  Interim History / Subjective:  This morning she denies complaints.  Afebrile. 1 unit pRBC ordered overnight.   Objective    Blood pressure (!) 142/46, pulse (!) 59, temperature 98.5 F (36.9 C), temperature source Axillary, resp. rate (!) 24, height 5' 7.5 (1.715 m), weight 55.3 kg, SpO2 99%.    Vent Mode: PRVC FiO2 (%):  [40 %] 40 % Set Rate:  [24 bmp] 24 bmp Vt Set:  [500 mL] 500 mL PEEP:  [8 cmH20] 8 cmH20 Pressure Support:  [10 cmH20] 10 cmH20 Plateau Pressure:  [20 cmH20]  20 cmH20   Intake/Output Summary (Last 24 hours) at 07/19/2024 9277 Last data filed at 07/19/2024 0600 Gross per 24 hour  Intake 2689.63 ml  Output 2710 ml  Net -20.37 ml   Filed Weights   07/10/24 0500 07/16/24 0200 07/19/24 0600  Weight: 66.7 kg 62.5 kg 55.3 kg    Examination: General:  critically ill appearing woman lying in bed in NAD HEENT: [place]/AT, eyes anicteric Neuro: RASS 0, following commands, but globally weak. CV: S1S2, RRR PULM: thick tan secretions, moderate cough, no wheezing or rhales.  GI: soft, still moderately distended Extremities: improving edema  NGT output 120 cc I/O -40cc, net +7.5L for admission   Na+ 154 K+ 3.4 BUN 31 Cr 0.71 WBC 7.3 H/H  6.7/22.3 Platelets 220 Resp culture > abundant PMN, mod GNR >    Resolved problem list   Assessment and Plan  Acute on chronic hypoxic respiratory failure on 2 L nasal cannula at baseline due to history of COPD- -10/26 emergently intubated due to worsening respiratory distress Aspiration pneumonia RLL- Enterobacter; awaiting new cultures -recultured sputum yesterday; awaiting updated results, but cefepime is improving fevers and WBC -LTVV -VAP prevention protocol -PAD protocol for sedation -daily SAT & SBT as appropriate- passed. Extubating to ; secretions  & weakness would be the biggest concerns for potential reintubation risk.  -con't bronchodilators- brovana & yupelri -CPT, 3% saline nebs added  Prolonged ongoing colonic ileus - Developed pretty early on stay given opioid use during acute fracture, immobility.  TPN started 10/23. Per family, had a history of chronic constipation and used laxatives chronically. Still has high NGT output. -con't  reglan since it helped reduce NGT output -needs NGT after extubation -avoiding all opiates as much as possible -mobilize as able; limited with hip -TPN & PICC -can restart miralax  if NGT output remains low  New onset A-fib RVR, due to critical illness. Rate  control improved today. -con't amiodarone; can switch back to IV if needed -con't holding AC due to bleeding concerns -tele monitoring -monitor electrolytes and replete as needed  Acute periprosthetic right femur fracture -per 10/4 ortho note, nonoperative management. WBAT.  -needs OP follow up with [facility] -prefer nonopiate pain control  Anemia of chronic disease; Seems to have a component of acute bleeding as well with drop overnight again.  B12 deficiency -1 unit pRBC ordered -con't heping AC -Ct abd/pelvis  Chronic HFpEF History of CAD-Status post [facility] July 2024 with mild to moderate nonobstructive disease was previously on DAPT for PAD Hyperlipidemia Essential hypertension PAD -lasix  40mg   -daily aspirin  and statin -con't holding PTA antihypertensives  H/o gout -allopurinol   Moderate protein energy malnutrition -TPN; hopefully can restart PO intake soon and start weaning off this  Hypernatremia; have been adding more FW to TPN -taking sodium out of TPN today  Daughter Stacy updated on plans for extubation this morning- discussed risk for reintubation and ongoing ICU monitoring overnight tonight.    Labs   CBC: Recent Labs  Lab 07/16/24 0517 07/17/24 0418 07/17/24 1518 07/18/24 0053 07/19/24 0446  WBC 18.3* 18.1* 16.4* 11.9* 7.3  HGB 8.4* 7.6* 7.4* 7.1* 6.7*  HCT 26.6* 24.5* 24.4* 23.4* 22.3*  MCV 98.9 99.2 103.4* 101.3* 102.3*  PLT 330 304 268 237 220    Basic Metabolic Panel: Recent Labs  Lab 07/15/24 0219 07/16/24 0517 07/17/24 0418 07/18/24 0053 07/19/24 0446  NA 139 144 148* 151* 154*  K 4.8 4.0 2.9* 3.5 3.4*  CL 104 108 110 113* 117*  CO2 25 25 28 27 26   GLUCOSE 121* 144* 134* 162* 172*  BUN 33* 35* 32* 35* 31*  CREATININE 0.75 0.67 0.77 0.80 0.71  CALCIUM  8.0* 7.8* 7.5* 7.5* 8.0*  MG 2.6* 2.4 2.1 2.2 2.3  PHOS 3.7 3.4 2.9 2.6 2.8   GFR: Estimated Creatinine Clearance: 53 mL/min (by C-G formula based on SCr of 0.71  mg/dL). Recent Labs  Lab 07/12/24 1422 07/12/24 1752 07/13/24 0310 07/17/24 0418 07/17/24 1518 07/18/24 0053 07/19/24 0446  WBC  --   --    < > 18.1* 16.4* 11.9* 7.3  LATICACIDVEN 2.5* 1.3  --   --   --   --   --    < > = values in this interval not displayed.    Liver Function Tests: No results for input(s): AST, ALT, ALKPHOS, BILITOT, PROT, ALBUMIN  in the last 168 hours.  Critical care time:     This patient is critically ill with multiple organ system failure which requires frequent high complexity decision making, assessment, support, evaluation, and titration of therapies. This was completed through the application of advanced monitoring technologies and extensive interpretation of multiple databases. During this encounter critical care time was devoted to patient care services described in this note for 45 minutes.  Leita SHAUNNA Gaskins, DO 07/19/24 10:21 AM [place] [facility]  For contact information, see Amion. If no response to pager, please call PCCM consult pager. After hours, 7PM- 7AM, please call Elink.

## 2024-07-19 NOTE — Procedures (Signed)
 Extubation Procedure Note  Patient Details:   Name: MADAI NUCCIO DOB: [birth year] MRN: [id number]   Airway Documentation:    Vent end date: 07/19/24 Vent end time: 1014   Evaluation  O2 sats: stable throughout Complications: No apparent complications Patient did tolerate procedure well. Bilateral Breath Sounds: Clear, Diminished   No  Pt extubated per MD order. Placed on 4L [place]. Positive cuff leak noted, no stridor heard. VSS. Pt not speaking at this time. RT will continue to monitor.   Duwaine Charles 07/19/2024, 10:16 AM

## 2024-07-19 NOTE — Procedures (Signed)
 Intubation Procedure Note  Marie Johnson  [id number]  [birth year]  Date:07/19/24  Time:11:09 AM   Provider Performing:Brain Honeycutt [name]    Procedure: Intubation (31500)  Indication(s) Respiratory Failure  Consent Risks of the procedure as well as the alternatives and risks of each were explained to the patient and/or caregiver.  Consent for the procedure was obtained and is signed in the bedside chart   Anesthesia Versed , Rocuronium , and ketamine   Time Out Verified patient identification, verified procedure, site/side was marked, verified correct patient position, special equipment/implants available, medications/allergies/relevant history reviewed, required imaging and test results available.   Sterile Technique Usual hand hygeine, masks, and gloves were used   Procedure Description Patient positioned in bed supine.  Sedation given as noted above.  Patient was intubated with endotracheal tube using Glidescope.  View was Grade 1 full glottis .  Number of attempts was 1.  Colorimetric CO2 detector was consistent with tracheal placement.   Complications/Tolerance None; patient tolerated the procedure well. Chest X-ray is ordered to verify placement.   EBL Minimal   Specimen(s) None  Marie Johnson Gretta, DO 07/19/24 11:09 AM [place] [facility]  For contact information, see Amion. If no response to pager, please call PCCM consult pager. After hours, 7PM- 7AM, please call Elink.

## 2024-07-19 NOTE — TOC Progression Note (Signed)
 Transition of Care [facility]) - Progression Note    Patient Details  Name: Marie Johnson MRN: [id number] Date of Birth: 1951-07-14  Transition of Care [facility]) CM/SW Contact  Isaiah Public, LCSWA Phone Number: 07/19/2024, 4:52 PM  Clinical Narrative:     TOC continues to follow.  Expected Discharge Plan: Skilled Nursing Facility Barriers to Discharge: Continued Medical Work up, SNF Pending bed offer               Expected Discharge Plan and Services In-house Referral: Clinical Social Work   Post Acute Care Choice: Skilled Nursing Facility Living arrangements for the past 2 months: Single Family Home                                       Social Drivers of Health (SDOH) Interventions SDOH Screenings   Food Insecurity: No Food Insecurity (06/29/2024)  Housing: High Risk (06/29/2024)  Transportation Needs: No Transportation Needs (06/29/2024)  Utilities: Not At Risk (06/29/2024)  Depression (PHQ2-9): Low Risk  (05/02/2023)  Social Connections: Moderately Isolated (06/29/2024)  Tobacco Use: Medium Risk (06/29/2024)    Readmission Risk Interventions    06/06/2023   12:35 PM  Readmission Risk Prevention Plan  Transportation Screening Complete  PCP or Specialist Appt within 5-7 Days Complete  Home Care Screening Complete  Medication Review (RN CM) Complete

## 2024-07-19 NOTE — Progress Notes (Signed)
 Within about 30 min of extubation having desaturations, using accessory muscles. NTS completed by RT, pre-oxygenated with NTB, but despite saturation in high 90s, still using accessory muscles. Called daughter Stacy to notify her of need for re-intubation.   Leita SHAUNNA Gaskins, DO 07/19/24 11:11 AM [place] [facility]  For contact information, see Amion. If no response to pager, please call PCCM consult pager. After hours, 7PM- 7AM, please call Elink.

## 2024-07-19 NOTE — Progress Notes (Signed)
 eLink Physician-Brief Progress Note Patient Name: AKYLA VAVREK DOB: 08/31/1951 MRN: [id number]   Date of Service  07/19/2024  HPI/Events of Note  Notified of Hgb 6.7 Hgb has been slowly trending downward.  No evidence of ongoing bleed per [facility].   eICU Interventions  Placed order to transfuse 1 unit pRBC        Lawana Hartzell M DELA CRUZ 07/19/2024, 5:24 AM

## 2024-07-19 NOTE — Progress Notes (Signed)
 [facility] ADULT ICU REPLACEMENT PROTOCOL   The patient does apply for the [facility] Adult ICU Electrolyte Replacment Protocol based on the criteria listed below:   1.Exclusion criteria: TCTS, ECMO, Dialysis, and Myasthenia Gravis patients 2. Is GFR >/= 30 ml/min? Yes.    Patient's GFR today is >60 3. Is SCr </= 2? Yes.   Patient's SCr is 0.71 mg/dL 4. Did SCr increase >/= 0.5 in 24 hours? No. 5.Pt's weight >40kg  Yes.   6. Abnormal electrolyte(s): K+ = 3.4  7. Electrolytes replaced per protocol 8.  Call MD STAT for K+ </= 2.5, Phos </= 1, or Mag </= 1 Physician:  Mallory Breed, eMD   Marie Johnson 07/19/2024 6:52 AM

## 2024-07-19 NOTE — Progress Notes (Signed)
 Son called back, spoke to RN- said family will come in tomorrow for family meeting to discuss goals of care.   Leita SHAUNNA Gaskins, DO 07/19/24 5:11 PM [place] [facility]  For contact information, see Amion. If no response to pager, please call PCCM consult pager. After hours, 7PM- 7AM, please call Elink.

## 2024-07-19 NOTE — Progress Notes (Addendum)
 PHARMACY - TOTAL PARENTERAL NUTRITION CONSULT NOTE   Indication: Prolonged ileus  Patient Measurements: Height: 5' 7.5 (171.5 cm) Weight: 55.3 kg (121 lb 14.6 oz) IBW/kg (Calculated) : 62.75 TPN AdjBW (KG): 61.2 Body mass index is 18.81 kg/m. Usual Weight: 140lbs per pt, 135 lbs per chart review   Assessment:  73 yo W admitted with mechanical fall and right femur fracture 10/13 and then found to have ileus on KUB 10/16. Pt was switched to clear liquid diet 10/17-10/20 but vomited what she tried drink. Patient has been NPO since so has had no intake for 7 days. She had N/V and required NG tube placement 10/20 PM with brown particulate output and improvement in N/V. The last meal patient remembers eating is a ham and beef sandwich on 10/13. She normally eats 2-3 meals a day - oatmeal or grits for breakfast (doesn't like eggs), sandwiches and meals from meals on wheels. She is not a big eater but is very hungry as of 10/23 and has liked vanilla ensures in the past. She has been on D5LR since 10/20 PM, receiving 90g dextrose /24hr, which will help decrease refeeding risk. Pharmacy consulted for TPN.   Glucose / Insulin: BG <180, used 9 units SSI/24hr; no hx DM Electrolytes: Na up 154 (reducing in TPN), K 3.4 (receiving 40mEq IV this AM), Phos trend down 2.6, CoCa 8.8, Cl slightly up, CO2 wnl - will watch Renal: Scr and BUN stable Hepatic: Alk phos/AST/AL/Tbili wnl, albumin  2.4, TG 84  Intake / Output; MIVF: UOP 1.6 ml/kg/hr s/p Lasix  60 x1 yesterday, NGT output up , stool (goal very soft-liquid stool), stopped Neostigmine, started Reglan q8h, maintains on Miralax  to 34g BID  GI Imaging: 10/16 KUB: Diffuse bowel gas distension throughout the abdomen, possible ileus  10/17 CT: Gaseous distention of the colon without discrete focal transition point or mass lesion likely representing ileus 10/20 KUB: Persistent gaseous dilatation of the large bowel 10/21 KUB: moderate diffuse bowel  distension, consistent with ileus 10/24 KUB:  Gaseous distension of large and small bowel consistent with ileus, slightly improved from prior. 10/27 CTAP: diffusely gas filled dilated colon , few narrowed segments > ileus  GI Surgeries / Procedures:  10/24 NGT removed   Central access: PICC 10/23  TPN start date: 10/23  Nutritional Goals: Goal TPN rate with full lipids is 70 mL/hr (provides 90 g of protein and 1705 kcals per day) -meeting 100% needs  Goal TPN rate with reduced lipids (25%) is 70 mL/hr (provides 90 g of protein and 1589 kcals per day) -with Propofol  meeting 100% needs  RD Assessment: Estimated Needs Total Energy Estimated Needs: 1700-1900 kcal/d Total Protein Estimated Needs: 80-95g/d Total Fluid Estimated Needs: 1.8L/d  Current Nutrition:  TPN 10/23 CLD 10/24 FLD- had beef broth, cranberry juice, coffee, chocolate pudding, apple sauce, no Ensure 10/25 Soft - patient in a lot of pain, told RN she doesn't like Ensure and refused. Only had soup and ice cream 10/26 Soft - patient willing to try Ensure  10/27-28 NPO -propofol  @20 -40 mcg/kg/min > 8-16 ml/hr > 211-422 kcal /day  10/29 NPO - propofol  @ 20 mcg/kg/min > 8 ml/hr 10/30 NPO - propofol  @10 -20 mcg/kg/min > 4-8 ml/hr (plan to keep on) 10/31-11/1 NPO - propofol  @ 20-30 mcg/kgmin > 8-12 ml/hr  Plan:  Continue TPN at new goal of 70 mL/hr with reduced lipids at 1800, provides 100% estimated needs with propofol  Electrolytes in TPN: Remove Na, increase K to 56mEq/L, Ca 2 mEq/L, Mg 5 mEq/L, and  Phos to 12 mmol/L. Cl:Ac 1:1 Per tube MVI and trace elements   Continue sensitive SSI q4h and monitor CBGs Monitor TPN labs Mon/Thurs and PRN   Harlene Boga, PharmD, BCPS, BCCCP Clinical Pharmacist Please refer to [facility] for [facility] Pharmacy numbers 07/19/2024 7:03 AM

## 2024-07-19 NOTE — Progress Notes (Signed)
 RT note. Patient re intubated at this time by MD . VSS throughout, good etco2 change.  Patient placed back on previous settings, RT will continue to monitor.    07/19/24 1110  Vent Select  Invasive or Noninvasive Invasive  Adult Vent Y  Vent start date 07/19/24  Vent start time 1110  Airway 8 mm  Placement Date/Time: 07/19/24 1110   Laryngoscope Blade: 3  ETT Types: Endobronchial  Size (mm): 8 mm  Cuffed: Cuffed  Insertion attempts: 1  Airway Equipment: Stylet;Video Laryngoscope  Placement Confirmation: Direct Visualization;Bilateral Breath So...  Secured at (cm) 25 cm  Measured From Lips  Secured Location Center  Secured By English As [employer] Language [employer] No  Cuff Pressure (cm H2O) Clear OR 27-39 CmH2O  Site Condition Dry  Adult Ventilator Settings  Vent Type Servo i  Humidity HME  Vent Mode PRVC  Vt Set 500 mL  Set Rate 24 bmp  FiO2 (%) 60 %  I Time 0.8 Sec(s)  PEEP 8 cmH20  Adult Ventilator Measurements  Peak Airway Pressure 25 L/min  Mean Airway Pressure 14 cmH20  Resp Rate Spontaneous 0 br/min  Resp Rate Total 24 br/min  Exhaled Vt 505 mL  Measured Ve 12 L  I:E Ratio Measured 1:2.1  Auto PEEP 0 cmH20  Total PEEP 8 cmH20  SpO2 96 %  Adult Ventilator Alarms  Alarms On Y

## 2024-07-20 DIAGNOSIS — J9621 Acute and chronic respiratory failure with hypoxia: Secondary | ICD-10-CM | POA: Diagnosis not present

## 2024-07-20 DIAGNOSIS — K567 Ileus, unspecified: Secondary | ICD-10-CM | POA: Diagnosis not present

## 2024-07-20 DIAGNOSIS — D638 Anemia in other chronic diseases classified elsewhere: Secondary | ICD-10-CM

## 2024-07-20 DIAGNOSIS — I4891 Unspecified atrial fibrillation: Secondary | ICD-10-CM | POA: Diagnosis not present

## 2024-07-20 DIAGNOSIS — S7291XA Unspecified fracture of right femur, initial encounter for closed fracture: Secondary | ICD-10-CM

## 2024-07-20 DIAGNOSIS — E440 Moderate protein-calorie malnutrition: Secondary | ICD-10-CM

## 2024-07-20 DIAGNOSIS — J449 Chronic obstructive pulmonary disease, unspecified: Secondary | ICD-10-CM | POA: Diagnosis not present

## 2024-07-20 DIAGNOSIS — E785 Hyperlipidemia, unspecified: Secondary | ICD-10-CM

## 2024-07-20 LAB — TYPE AND SCREEN
ABO/RH(D): A POS
Antibody Screen: NEGATIVE
Unit division: 0

## 2024-07-20 LAB — CBC
HCT: 26 % — ABNORMAL LOW (ref 36.0–46.0)
Hemoglobin: 7.9 g/dL — ABNORMAL LOW (ref 12.0–15.0)
MCH: 30.3 pg (ref 26.0–34.0)
MCHC: 30.4 g/dL (ref 30.0–36.0)
MCV: 99.6 fL (ref 80.0–100.0)
Platelets: 166 K/uL (ref 150–400)
RBC: 2.61 MIL/uL — ABNORMAL LOW (ref 3.87–5.11)
RDW: 26.3 % — ABNORMAL HIGH (ref 11.5–15.5)
WBC: 6.3 K/uL (ref 4.0–10.5)
nRBC: 0 % (ref 0.0–0.2)

## 2024-07-20 LAB — CULTURE, RESPIRATORY W GRAM STAIN

## 2024-07-20 LAB — BPAM RBC
Blood Product Expiration Date: 202511052359
ISSUE DATE / TIME: 202511020731
Unit Type and Rh: 9500

## 2024-07-20 LAB — GLUCOSE, CAPILLARY
Glucose-Capillary: 147 mg/dL — ABNORMAL HIGH (ref 70–99)
Glucose-Capillary: 151 mg/dL — ABNORMAL HIGH (ref 70–99)
Glucose-Capillary: 138 mg/dL — ABNORMAL HIGH (ref 70–99)
Glucose-Capillary: 143 mg/dL — ABNORMAL HIGH (ref 70–99)

## 2024-07-20 LAB — TRIGLYCERIDES: Triglycerides: 96 mg/dL (ref <150)

## 2024-07-20 MED ORDER — GLYCOPYRROLATE 1 MG PO TABS: 1.0000 mg | ORAL_TABLET | ORAL | Status: DC | PRN

## 2024-07-20 MED ORDER — LORAZEPAM 2 MG/ML IJ SOLN
2.0000 mg | Freq: Once | INTRAMUSCULAR | Status: DC
Start: 2024-07-20 — End: 2024-07-21
  Filled 2024-07-20: qty 1

## 2024-07-20 MED ORDER — ACETAMINOPHEN 650 MG RE SUPP: 650.0000 mg | Freq: Four times a day (QID) | RECTAL | Status: DC | PRN

## 2024-07-20 MED ORDER — POLYVINYL ALCOHOL 1.4 % OP SOLN: 1.0000 [drp] | Freq: Four times a day (QID) | OPHTHALMIC | Status: DC | PRN

## 2024-07-20 MED ORDER — MORPHINE 100MG IN NS 100ML (1MG/ML) PREMIX INFUSION
1.0000 mg/h | INTRAVENOUS | Status: DC
  Administered 2024-07-20: 1 mg/h via INTRAVENOUS
  Filled 2024-07-20: qty 100

## 2024-07-20 MED ORDER — TRAVASOL 10 % IV SOLN
INTRAVENOUS | Status: DC
  Filled 2024-07-20: qty 907.2

## 2024-07-20 MED ORDER — GLYCOPYRROLATE 0.2 MG/ML IJ SOLN: 0.2000 mg | INTRAMUSCULAR | Status: DC | PRN

## 2024-07-20 MED ORDER — SODIUM CHLORIDE 0.9 % IV SOLN: INTRAVENOUS | Status: DC

## 2024-07-20 MED ORDER — LORAZEPAM 2 MG/ML IJ SOLN
1.0000 mg | INTRAMUSCULAR | Status: DC | PRN
  Administered 2024-07-20 (×2): 1 mg via INTRAVENOUS

## 2024-07-20 MED ORDER — ACETAMINOPHEN 325 MG PO TABS: 650.0000 mg | ORAL_TABLET | Freq: Four times a day (QID) | ORAL | Status: DC | PRN

## 2024-07-20 MED ORDER — GLYCOPYRROLATE 0.2 MG/ML IJ SOLN
0.2000 mg | INTRAMUSCULAR | Status: DC | PRN
  Administered 2024-07-20: 0.2 mg via INTRAVENOUS
  Filled 2024-07-20: qty 1

## 2024-07-22 ENCOUNTER — Ambulatory Visit: Admitting: Pulmonary Disease

## 2024-08-17 NOTE — Procedures (Signed)
 Extubation Procedure Note  Patient Details:   Name: Marie Johnson DOB: 1951/04/19 MRN: [id number]   Airway Documentation:  Airway 8 mm (Active)  Secured at (cm) 25 cm 07/30/2024 1602  Measured From Lips Jul 30, 2024 1602  Secured Location Center 07/30/2024 1602  Secured By [employer] 30-Jul-2024 1602  Bite Block No 07-30-2024 1602  Tube Holder Repositioned Yes 2024/07/30 1602  Prone position No 2024/07/30 1602  Cuff Pressure (cm H2O) Clear OR 27-39 CmH2O 07/30/24 0720  Site Condition Dry Jul 30, 2024 1602   Vent end date: July 30, 2024 Vent end time: 1927   Evaluation  O2 sats: stable throughout Complications: No apparent complications Patient did tolerate procedure well. Bilateral Breath Sounds: Clear, Diminished   No Terminal extubation   Luiz Velinda Craze 30-Jul-2024, 7:28 PM

## 2024-08-17 NOTE — Progress Notes (Signed)
 Patient extubated at approximately at 1928.   Patient on comfort care measures including morphine  drip, propofol  drip and precedex drip.   Patient TOD at 78, pronounced by 2 RNs Sonny Muck RN and Guadlupe Jubilee RN as ordered.   E-link and honor bridge notified.

## 2024-08-17 NOTE — Accreditation Note (Signed)
  Death within 24 hours of discontinuation of bilateral soft wrist restraints logged on 07-23-2024 at 1224 by Valentin Lai RN

## 2024-08-17 NOTE — Plan of Care (Signed)
  Problem: Education: Goal: Knowledge of General Education information will improve Description: Including pain rating scale, medication(s)/side effects and non-pharmacologic comfort measures Outcome: Progressing   Problem: Health Behavior/Discharge Planning: Goal: Ability to manage health-related needs will improve Outcome: Progressing   Problem: Clinical Measurements: Goal: Ability to maintain clinical measurements within normal limits will improve Outcome: Progressing Goal: Will remain free from infection Outcome: Progressing Goal: Diagnostic test results will improve Outcome: Progressing Goal: Respiratory complications will improve Outcome: Progressing Goal: Cardiovascular complication will be avoided Outcome: Progressing   Problem: Activity: Goal: Risk for activity intolerance will decrease Outcome: Progressing   Problem: Nutrition: Goal: Adequate nutrition will be maintained Outcome: Progressing   Problem: Coping: Goal: Level of anxiety will decrease Outcome: Progressing   Problem: Elimination: Goal: Will not experience complications related to bowel motility Outcome: Progressing Goal: Will not experience complications related to urinary retention Outcome: Progressing   Problem: Pain Managment: Goal: General experience of comfort will improve and/or be controlled Outcome: Progressing   Problem: Safety: Goal: Ability to remain free from injury will improve Outcome: Progressing   Problem: Skin Integrity: Goal: Risk for impaired skin integrity will decrease Outcome: Progressing   Problem: Education: Goal: Ability to describe self-care measures that may prevent or decrease complications (Diabetes Survival Skills Education) will improve Outcome: Progressing Goal: Individualized Educational Video(s) Outcome: Progressing   Problem: Coping: Goal: Ability to adjust to condition or change in health will improve Outcome: Progressing   Problem: Health  Behavior/Discharge Planning: Goal: Ability to identify and utilize available resources and services will improve Outcome: Progressing Goal: Ability to manage health-related needs will improve Outcome: Progressing   Problem: Fluid Volume: Goal: Ability to maintain a balanced intake and output will improve Outcome: Progressing   Problem: Metabolic: Goal: Ability to maintain appropriate glucose levels will improve Outcome: Progressing   Problem: Nutritional: Goal: Maintenance of adequate nutrition will improve Outcome: Progressing Goal: Progress toward achieving an optimal weight will improve Outcome: Progressing   Problem: Skin Integrity: Goal: Risk for impaired skin integrity will decrease Outcome: Progressing   Problem: Tissue Perfusion: Goal: Adequacy of tissue perfusion will improve Outcome: Progressing   Problem: Safety: Goal: Non-violent Restraint(s) Outcome: Progressing   Problem: Activity: Goal: Ability to tolerate increased activity will improve Outcome: Progressing   Problem: Respiratory: Goal: Ability to maintain a clear airway and adequate ventilation will improve Outcome: Progressing   Problem: Role Relationship: Goal: Method of communication will improve Outcome: Progressing

## 2024-08-17 NOTE — Progress Notes (Signed)
 PT Cancellation Note  Patient Details Name: Marie Johnson MRN: [id number] DOB: [birth year]   Cancelled Treatment:    Reason Eval/Treat Not Completed: Medical issues which prohibited therapy; new order received, noted pt did not tolerate PS/CPAP this morning and family to discuss GOC today.  Will follow up.    [name] 2024/07/21, 9:01 AM [name], PT Acute Rehabilitation Services Office:[phone] Jul 21, 2024

## 2024-08-17 NOTE — Plan of Care (Signed)

## 2024-08-17 NOTE — TOC Progression Note (Signed)
 Transition of Care [facility]) - Progression Note    Patient Details  Name: Marie Johnson MRN: [id number] Date of Birth: January 01, 1951  Transition of Care [facility]) CM/SW Contact  Lauraine FORBES Saa, LCSWA Phone Number: 07-27-24, 11:07 AM  Clinical Narrative:     11:07 AM Per progressions, MD to facilitate GOC meeting with patient's family today. CSW will continue to follow.  Expected Discharge Plan: Skilled Nursing Facility Barriers to Discharge: Continued Medical Work up, SNF Pending bed offer               Expected Discharge Plan and Services In-house Referral: Clinical Social Work   Post Acute Care Choice: Skilled Nursing Facility Living arrangements for the past 2 months: Single Family Home                                       Social Drivers of Health (SDOH) Interventions SDOH Screenings   Food Insecurity: No Food Insecurity (06/29/2024)  Housing: High Risk (06/29/2024)  Transportation Needs: No Transportation Needs (06/29/2024)  Utilities: Not At Risk (06/29/2024)  Depression (PHQ2-9): Low Risk  (05/02/2023)  Social Connections: Moderately Isolated (06/29/2024)  Tobacco Use: Medium Risk (06/29/2024)    Readmission Risk Interventions    06/06/2023   12:35 PM  Readmission Risk Prevention Plan  Transportation Screening Complete  PCP or Specialist Appt within 5-7 Days Complete  Home Care Screening Complete  Medication Review (RN CM) Complete

## 2024-08-17 NOTE — Progress Notes (Signed)
 Patient  increasingly tachycardic (HR in 110's) and tachypneic (RR in 40's), sweating and indicating she's having difficulty breathing while in PS/CPAP mode on ventilator. Patient placed back on PRVC and RT notified. Patient's RR and HR decreasing with interventions and patient indicating she feels more comfortable.

## 2024-08-17 NOTE — Progress Notes (Signed)
 NAME:  Marie Johnson, MRN:  [id number], DOB:  [birth year], LOS: 21 ADMISSION DATE:  06/29/2024, CONSULTATION DATE:  07/12/2024 REFERRING MD:  TRH, CHIEF COMPLAINT:   Acute respiratory distress  History of Present Illness:  Marie Johnson is a 73 y.o. female with a past medical history significant for but not limited to CHF, COPD, chronic hypoxic respiratory failure on 2 L nasal cannula at baseline, HTN, HLD, PAD, osteoporosis, and anxiety initially presented to the ED at [facility] 10/13 after suffering mechanical fall resulting in right periprosthetic femur fracture.  Since admission patient has had several complications, see timeline below  Pertinent  Medical History  CHF, COPD, chronic hypoxic respiratory failure on 2 L nasal cannula at baseline, HTN, HLD, PAD, osteoporosis, and anxiety  Significant Hospital Events: Including procedures, antibiotic start and stop dates in addition to other pertinent events   10/13 presented after suffering mechanical fall at home resulting in acute periprosthetic right femur fracture 10/16 abdominal distention noticed, KUB obtained 10/17 colonic ileus confirmed 10/18 GI consulted recommended medical management 10/21 chest x-ray with patchy densities in the right lung concerning for aspiration pneumonia in the setting of ileus 10/23 TPN started via PICC line 10/25 developed worsening respiratory distress with change in mentation transferred to ICU and emergently admitted  Interim History / Subjective:  This morning she followed commands on minimal sedation Failed SBT due to tachycardia and tachypnea  Objective    Blood pressure (!) 134/54, pulse 65, temperature 99.3 F (37.4 C), temperature source Axillary, resp. rate (!) 25, height 5' 7.5 (1.715 m), weight 55.2 kg, SpO2 97%.    Vent Mode: PRVC FiO2 (%):  [40 %] 40 % Set Rate:  [24 bmp] 24 bmp Vt Set:  [500 mL] 500 mL PEEP:  [5 cmH20-8 cmH20] 5 cmH20 Pressure Support:  [10 cmH20] 10  cmH20 Plateau Pressure:  [16 cmH20-20 cmH20] 16 cmH20   Intake/Output Summary (Last 24 hours) at August 02, 2024 1608 Last data filed at 08-02-24 1400 Gross per 24 hour  Intake 2147.93 ml  Output 2675 ml  Net -527.07 ml   Filed Weights   07/16/24 0200 07/19/24 0600 08/02/2024 0300  Weight: 62.5 kg 55.3 kg 55.2 kg    Examination: General:  critically ill appearing woman lying in bed in NAD HEENT: [place]/AT, eyes anicteric Neuro: RASS 0, following commands, but globally weak. CV: S1S2, RRR PULM: thick tan secretions, moderate cough, no wheezing or rhales.  GI: soft, still moderately distended Extremities: improving edema   CBC: Recent Labs  Lab 07/17/24 0418 07/17/24 1518 07/18/24 0053 07/19/24 0446 07/19/24 1203 August 02, 2024 1224  WBC 18.1* 16.4* 11.9* 7.3  --  6.3  HGB 7.6* 7.4* 7.1* 6.7* 7.5* 7.9*  HCT 24.5* 24.4* 23.4* 22.3* 22.0* 26.0*  MCV 99.2 103.4* 101.3* 102.3*  --  99.6  PLT 304 268 237 220  --  166    Basic Metabolic Panel: Recent Labs  Lab 07/15/24 0219 07/16/24 0517 07/17/24 0418 07/18/24 0053 07/19/24 0446 07/19/24 1203  NA 139 144 148* 151* 154* 156*  K 4.8 4.0 2.9* 3.5 3.4* 4.2  CL 104 108 110 113* 117*  --   CO2 25 25 28 27 26   --   GLUCOSE 121* 144* 134* 162* 172*  --   BUN 33* 35* 32* 35* 31*  --   CREATININE 0.75 0.67 0.77 0.80 0.71  --   CALCIUM  8.0* 7.8* 7.5* 7.5* 8.0*  --   MG 2.6* 2.4 2.1 2.2 2.3  --  PHOS 3.7 3.4 2.9 2.6 2.8  --    GFR: Estimated Creatinine Clearance: 52.9 mL/min (by C-G formula based on SCr of 0.71 mg/dL). Recent Labs  Lab 07/17/24 1518 07/18/24 0053 07/19/24 0446 07/29/2024 1224  WBC 16.4* 11.9* 7.3 6.3    Liver Function Tests: No results for input(s): AST, ALT, ALKPHOS, BILITOT, PROT, ALBUMIN  in the last 168 hours. No results for input(s): LIPASE, AMYLASE in the last 168 hours. No results for input(s): AMMONIA in the last 168 hours.  ABG    Component Value Date/Time   PHART 7.427 07/19/2024  1203   PCO2ART 40.2 07/19/2024 1203   PO2ART 121 (H) 07/19/2024 1203   HCO3 26.5 07/19/2024 1203   TCO2 28 07/19/2024 1203   O2SAT 99 07/19/2024 1203     Coagulation Profile: No results for input(s): INR, PROTIME in the last 168 hours.  Cardiac Enzymes: No results for input(s): CKTOTAL, CKMB, CKMBINDEX, TROPONINI in the last 168 hours.  HbA1C: Hgb A1c MFr Bld  Date/Time Value Ref Range Status  07/12/2024 02:22 PM 5.5 4.8 - 5.6 % Final    Comment:    (NOTE) Diagnosis of Diabetes The following HbA1c ranges recommended by the American Diabetes Association (ADA) may be used as an aid in the diagnosis of diabetes mellitus.  Hemoglobin             Suggested A1C NGSP%              Diagnosis  <5.7                   Non Diabetic  5.7-6.4                Pre-Diabetic  >6.4                   Diabetic  <7.0                   Glycemic control for                       adults with diabetes.    07/17/2013 08:00 PM 6.1 (H) <5.7 % Final    Comment:    (NOTE)                                                                       According to the ADA Clinical Practice Recommendations for 2011, when HbA1c is used as a screening test:  >=6.5%   Diagnostic of Diabetes Mellitus           (if abnormal result is confirmed) 5.7-6.4%   Increased risk of developing Diabetes Mellitus References:Diagnosis and Classification of Diabetes Mellitus,Diabetes Care,2011,34(Suppl 1):S62-S69 and Standards of Medical Care in         Diabetes - 2011,Diabetes Care,2011,34 (Suppl 1):S11-S61.    CBG: Recent Labs  Lab 07/19/24 2302 2024/07/29 0303 Jul 29, 2024 0704 07/29/2024 1119 29-Jul-2024 1458  GLUCAP 151* 147* 151* 138* 143*     Resolved problem list   Assessment and Plan   Acute on chronic hypoxic respiratory failure on 2 L nasal cannula at baseline due to history of COPD- -10/26 emergently intubated due to worsening respiratory distress and extubated and reintubated yesterday Aspiration  pneumonia  RLL- Enterobacter and pseudomonas on new tracheal cultures 10/31- continue cefepime  -LTVV -VAP prevention protocol -PAD protocol for sedation -daily SAT & SBT as appropriate -con't bronchodilators- brovana & yupelri -CPT, 3% saline nebs added  Prolonged ongoing colonic ileus - Developed pretty early on stay given opioid use during acute fracture, immobility.  TPN started 10/23. Per family, had a history of chronic constipation and used laxatives chronically. Still has high NGT output. -con't reglan since it helped reduce NGT output -avoiding all opiates as much as possible -mobilize as able; limited with hip -TPN & PICC -can restart miralax  if NGT output remains low  New onset A-fib RVR, due to critical illness. Rate control improved today. -con't amiodarone; can switch back to IV if needed -con't holding AC due to bleeding concerns -tele monitoring -monitor electrolytes and replete as needed  Acute periprosthetic right femur fracture -per 10/4 ortho note, nonoperative management. WBAT.  -needs OP follow up with [facility] -prefer nonopiate pain control  Anemia of chronic disease; Seems to have a component of acute bleeding as well with drop overnight again.  B12 deficiency intermittent PRBC needs -con't holding AC - no evidence of obvious GI bleed so far  Chronic HFpEF History of CAD-Status post [facility] July 2024 with mild to moderate nonobstructive disease was previously on DAPT for PAD Hyperlipidemia Essential hypertension PAD -daily aspirin  and statin -con't holding PTA antihypertensives  H/o gout -allopurinol   Moderate protein energy malnutrition -TPN; hopefully can restart PO intake soon and start weaning off this  Hx of laryngeal cancer 2024  Hypernatremia;   Goals of care discussion: Pt failed SBT again this morning. She otherwise follows basic commands on minimal sedation I spoke to pt's husband at bedside after morning rounds and updated on her  current status He stated she didn't want prolonged ventilation. However he wanted to make sure his step children (pt's son and 2 daughters) were on board. I offered to speak with all of them. In the afternoon, I had a goals of care discussion with the family again- 5 family members were present in the room including POA husband. After explaining her current status and barriers to discharge home, they all agreed to make her comfortable. Explained comfort care pathways/inpatient hospice. Family dont want pt to have pain or have air hunger. Once all the family members bide their goodbyes, we will proceed with comfort care- morphine  drip and prn ativan . No escalation of care till then   Labs   CBC: Recent Labs  Lab 07/17/24 0418 07/17/24 1518 07/18/24 0053 07/19/24 0446 07/19/24 1203 01-Aug-2024 1224  WBC 18.1* 16.4* 11.9* 7.3  --  6.3  HGB 7.6* 7.4* 7.1* 6.7* 7.5* 7.9*  HCT 24.5* 24.4* 23.4* 22.3* 22.0* 26.0*  MCV 99.2 103.4* 101.3* 102.3*  --  99.6  PLT 304 268 237 220  --  166    Basic Metabolic Panel: Recent Labs  Lab 07/15/24 0219 07/16/24 0517 07/17/24 0418 07/18/24 0053 07/19/24 0446 07/19/24 1203  NA 139 144 148* 151* 154* 156*  K 4.8 4.0 2.9* 3.5 3.4* 4.2  CL 104 108 110 113* 117*  --   CO2 25 25 28 27 26   --   GLUCOSE 121* 144* 134* 162* 172*  --   BUN 33* 35* 32* 35* 31*  --   CREATININE 0.75 0.67 0.77 0.80 0.71  --   CALCIUM  8.0* 7.8* 7.5* 7.5* 8.0*  --   MG 2.6* 2.4 2.1 2.2 2.3  --   PHOS 3.7  3.4 2.9 2.6 2.8  --    GFR: Estimated Creatinine Clearance: 52.9 mL/min (by C-G formula based on SCr of 0.71 mg/dL). Recent Labs  Lab 07/17/24 1518 07/18/24 0053 07/19/24 0446 07/30/24 1224  WBC 16.4* 11.9* 7.3 6.3    Liver Function Tests: No results for input(s): AST, ALT, ALKPHOS, BILITOT, PROT, ALBUMIN  in the last 168 hours.  Critical care time:    This patient is critically ill with multiple organ system failure which requires frequent high  complexity decision making, assessment, support, evaluation, and titration of therapies. This was completed through the application of advanced monitoring technologies and extensive interpretation of multiple databases. During this encounter critical care time was devoted to patient care services described in this note for 50+30 minutes.  Keiran Gaffey Pleas, MD 2024/07/30 4:08 PM [facility]  For contact information, see Amion. If no response to pager, please call PCCM consult pager. After hours, 7PM- 7AM, please call Elink.

## 2024-08-17 NOTE — Progress Notes (Signed)
 PHARMACY - TOTAL PARENTERAL NUTRITION CONSULT NOTE   Indication: Prolonged ileus  Patient Measurements: Height: 5' 7.5 (171.5 cm) Weight: 55.2 kg (121 lb 11.1 oz) IBW/kg (Calculated) : 62.75 TPN AdjBW (KG): 61.2 Body mass index is 18.78 kg/m. Usual Weight: 140lbs per pt, 135 lbs per chart review   Assessment:  73 yo W admitted with mechanical fall and right femur fracture 10/13 and then found to have ileus on KUB 10/16. Pt was switched to clear liquid diet 10/17-10/20 but vomited what she tried drink. Patient has been NPO since so has had no intake for 7 days. She had N/V and required NG tube placement 10/20 PM with brown particulate output and improvement in N/V. The last meal patient remembers eating is a ham and beef sandwich on 10/13. She normally eats 2-3 meals a day - oatmeal or grits for breakfast (doesn't like eggs), sandwiches and meals from meals on wheels. She is not a big eater but is very hungry as of 10/23 and has liked vanilla ensures in the past. She has been on D5LR since 10/20 PM, receiving 90g dextrose /24hr, which will help decrease refeeding risk. Pharmacy consulted for TPN.   Glucose / Insulin: BG <180, used 10 units SSI/24hr; no hx DM Electrolytes: Na up 154 (reducing in TPN), K 3.4 Phos 2.6, CoCa 8.8, Cl slightly up, CO2 wnl  Renal: Scr and BUN stable Hepatic: Alk phos/AST/AL/Tbili wnl, albumin  2.4, TG 84  Intake / Output; MIVF: UOP 2.0 ml/kg/hr, NGT output 475 mL, 0 mL stool (goal very soft-liquid stool), maintains on Miralax  to 34g BID  GI Imaging: 10/16 KUB: Diffuse bowel gas distension throughout the abdomen, possible ileus  10/17 CT: Gaseous distention of the colon without discrete focal transition point or mass lesion likely representing ileus 10/20 KUB: Persistent gaseous dilatation of the large bowel 10/21 KUB: moderate diffuse bowel distension, consistent with ileus 10/24 KUB:  Gaseous distension of large and small bowel consistent with ileus, slightly  improved from prior. 10/27 CTAP: diffusely gas filled dilated colon , few narrowed segments > ileus  GI Surgeries / Procedures:  10/24 NGT removed   Central access: PICC 10/23  TPN start date: 10/23  Nutritional Goals: Goal TPN rate with full lipids is 70 mL/hr (provides 90 g of protein and 1705 kcals per day) -meeting 100% needs  Goal TPN rate with reduced lipids (25%) is 70 mL/hr (provides 90 g of protein and 1589 kcals per day) -with Propofol  meeting 100% needs  RD Assessment: Estimated Needs Total Energy Estimated Needs: 1700-1900 kcal/d Total Protein Estimated Needs: 80-95g/d Total Fluid Estimated Needs: 1.8L/d  Current Nutrition:  TPN 10/23 CLD 10/24 FLD- had beef broth, cranberry juice, coffee, chocolate pudding, apple sauce, no Ensure 10/25 Soft - patient in a lot of pain, told RN she doesn't like Ensure and refused. Only had soup and ice cream 10/26 Soft - patient willing to try Ensure  10/27-28 NPO -propofol  @20 -40 mcg/kg/min > 8-16 ml/hr > 211-422 kcal /day  10/29 NPO - propofol  @ 20 mcg/kg/min > 8 ml/hr 10/30 NPO - propofol  @10 -20 mcg/kg/min > 4-8 ml/hr  10/31-11/3 NPO - propofol  @ 20-40 mcg/kgmin > 8-16 ml/hr  Plan:  Continue TPN at new goal of 70 mL/hr with reduced lipids at 1800, provides 100% estimated needs with propofol  Electrolytes in TPN: Remove Na, K 34mEq/L, Ca 2 mEq/L, Mg 5 mEq/L, and Phos to 12 mmol/L. Cl:Ac 1:2 Per tube MVI and trace elements   Continue sensitive SSI q4h and monitor CBGs Monitor TPN  labs Mon/Thurs and PRN   Sharyne Glatter, PharmD, BCCCP Critical Care Clinical Pharmacist 08/02/24 9:54 AM

## 2024-08-17 NOTE — Discharge Summary (Signed)
 Physician Discharge Summary  Patient ID: Marie Johnson MRN: [id number] DOB/AGE: [birth year] 73 y.o.  Admit date: 06/29/2024 Discharge date: 07/25/2024  Problem List Principal Problem: Acute on chronic hypoxic respiratory failure requiring mechanical ventilation Closed right hip fracture (HCC) Active Problems:   Coronary artery disease involving native coronary artery of native heart   Tobacco abuse   Hypertension   Hyperlipidemia   COPD (chronic obstructive pulmonary disease) (HCC)   Chronic diastolic CHF (congestive heart failure) (HCC)   Malnutrition of moderate degree   Closed fracture of shaft of right femur (HCC)   Fall New onset A-fib with RVR  HPI: MERIT Marie Johnson is a 73 y.o. female with a past medical history significant for but not limited to CHF, COPD, chronic hypoxic respiratory failure on 2 L nasal cannula at baseline, HTN, HLD, PAD, osteoporosis, and anxiety initially presented to the ED at [facility] 10/13 after suffering mechanical fall resulting in right periprosthetic femur fracture.   Hospital Course: Patient was admitted to the hospital on October 13 after suffering a mechanical fall resulting in acute periprostatic right femur fracture.  On 16th, she was noted to have abdominal distention, x-rays confirmed colonic ileus.  GI recommended medical management.  On 21st, patient was noted to have right lung opacities concerning for aspiration pneumonia in the setting of ileus.  TPN was started on the 23rd.  On October 25, patient developed worsening respiratory distress with change in mentation.  Was transferred to ICU and emergently intubated.  Sputum culture grew Enterobacter and Pseudomonas.  She was extubated on November 2 but was emergently reintubated for respiratory distress. Patient failed SBT attempts after that.  She also continued to have high NG output due to ongoing colonic ileus.  Patient required amiodarone for A-fib with RVR while in the ICU.  As patient  failed SBT, goals of care discussions were conducted with the patient's husband and children.  Patient's husband  stated she didn't want prolonged ventilation. However he wanted to make sure his step children (pt's son and 2 daughters) were on board. I offered to speak with all of them. In the afternoon, I had a goals of care discussion with the family again- 5 family members were present in the room including POA husband. After explaining her current status and barriers to discharge home, they all agreed to make her comfortable. Explained comfort care pathways/inpatient hospice. Family dont want pt to have pain or have air hunger. Once all the family members bid their goodbyes, comfort care pathways were initiated and patient was terminally extubated at 7:30 PM.  At 8:15 PM, patient passed away peacefully.   Labs at discharge Lab Results  Component Value Date   CREATININE 0.71 07/19/2024   BUN 31 (H) 07/19/2024   NA 156 (H) 07/19/2024   K 4.2 07/19/2024   CL 117 (H) 07/19/2024   CO2 26 07/19/2024   Lab Results  Component Value Date   WBC 6.3 11-Aug-2024   HGB 7.9 (L) 2024/08/11   HCT 26.0 (L) 08/11/2024   MCV 99.6 11-Aug-2024   PLT 166 08-11-24   Lab Results  Component Value Date   ALT 12 07/12/2024   AST 19 07/12/2024   ALKPHOS 74 07/12/2024   BILITOT 0.8 07/12/2024   Lab Results  Component Value Date   INR 1.1 03/15/2020   INR 1.1 03/09/2020   INR 1.1 03/08/2020    Current radiology studies No results found.  Disposition:  morgue   Discharged Condition: deceased  Vital signs at Discharge.- no BP, no HR    Signed: Maddox Hlavaty Pleas, MD Pulmonary and Critical Care Medicine [place]  07/25/2024 5:55 PM Pager: see AMION  If no response to pager, please call critical care on call (see AMION) until 7pm After 7:00 pm call Elink

## 2024-08-17 DEATH — deceased

## 2024-12-01 ENCOUNTER — Ambulatory Visit: Admitting: Radiology

## 2024-12-01 ENCOUNTER — Ambulatory Visit
